# Patient Record
Sex: Male | Born: 1955 | Race: White | Hispanic: No | Marital: Married | Smoking: Former smoker
Health system: Southern US, Community
[De-identification: ages and names within clinical notes are randomized; demographics above are authoritative.]

## PROBLEM LIST (undated history)

## (undated) DIAGNOSIS — J449 Chronic obstructive pulmonary disease, unspecified: Secondary | ICD-10-CM

## (undated) DIAGNOSIS — T7840XA Allergy, unspecified, initial encounter: Secondary | ICD-10-CM

## (undated) DIAGNOSIS — N400 Enlarged prostate without lower urinary tract symptoms: Secondary | ICD-10-CM

## (undated) HISTORY — DX: Chronic obstructive pulmonary disease, unspecified: J44.9

## (undated) HISTORY — DX: Allergy, unspecified, initial encounter: T78.40XA

## (undated) HISTORY — DX: Benign prostatic hyperplasia without lower urinary tract symptoms: N40.0

---

## 1980-07-28 HISTORY — PX: HERNIA REPAIR: SHX51

## 1992-07-28 HISTORY — PX: HERNIA REPAIR: SHX51

## 2006-12-07 ENCOUNTER — Emergency Department: Payer: Self-pay | Admitting: Unknown Physician Specialty

## 2009-07-24 ENCOUNTER — Ambulatory Visit: Payer: Self-pay | Admitting: Family Medicine

## 2010-04-24 DIAGNOSIS — J302 Other seasonal allergic rhinitis: Secondary | ICD-10-CM | POA: Insufficient documentation

## 2014-10-11 LAB — PSA: PSA: ABNORMAL

## 2014-10-11 LAB — LIPID PANEL
Cholesterol: 188 mg/dL (ref 0–200)
HDL: 106 mg/dL — AB (ref 35–70)
LDL Cholesterol: 71 mg/dL
Triglycerides: 55 mg/dL (ref 40–160)

## 2014-10-18 ENCOUNTER — Encounter: Payer: Self-pay | Admitting: General Surgery

## 2014-11-01 ENCOUNTER — Ambulatory Visit (INDEPENDENT_AMBULATORY_CARE_PROVIDER_SITE_OTHER): Payer: BLUE CROSS/BLUE SHIELD | Admitting: General Surgery

## 2014-11-01 ENCOUNTER — Encounter: Payer: Self-pay | Admitting: General Surgery

## 2014-11-01 DIAGNOSIS — L723 Sebaceous cyst: Secondary | ICD-10-CM | POA: Diagnosis not present

## 2014-11-01 NOTE — Patient Instructions (Signed)
The patient is aware to call back for any questions or concerns.  

## 2014-11-01 NOTE — Progress Notes (Signed)
Patient ID: Cody [name], male   DOB: 21-Dec-1955, 59 y.o.   MRN: [id number]  Chief Complaint  Patient presents with  . Cyst    left upper back    HPI Cody [name] is a 59 y.o. male.  Here for evaluation of a cyst upper back. He states it has been there for many years and it has been drained a couple of times in the past. He states it started draining this time about one month ago.   HPI     Past Medical History  Diagnosis Date  . Prostate enlargement   . COPD (chronic obstructive pulmonary disease)     Past Surgical History  Procedure Laterality Date  . Hernia repair Bilateral 1982  . Hernia repair Right 1994    History reviewed. No pertinent family history.  Social History History  Substance Use Topics  . Smoking status: Former Smoker -- 30 years    Quit date: 07/29/2011  . Smokeless tobacco: Never Used  . Alcohol Use: 0.0 oz/week    0 Standard drinks or equivalent per week     Comment: 1-2/day    Not on File  Current Outpatient Prescriptions  Medication Sig Dispense Refill  . fluticasone (FLONASE) 50 MCG/ACT nasal spray   0  . loratadine (CLARITIN) 10 MG tablet Take 10 mg by mouth daily.     No current facility-administered medications for this visit.    Review of Systems Review of Systems  Constitutional: Negative.   Respiratory: Negative.   Cardiovascular: Negative.     Blood pressure 130/74, pulse 78, resp. rate 12, height 5\' 9"  (1.753 m), weight 139 lb (63.05 kg).  Physical Exam Physical Exam  Constitutional: He is oriented to person, place, and time. He appears well-developed and well-nourished.  Eyes: Conjunctivae are normal. No scleral icterus.  Neck: Neck supple.  Cardiovascular: Normal rate, regular rhythm and normal heart sounds.   Pulmonary/Chest: Effort normal and breath sounds normal.  Lymphadenopathy:    He has no cervical adenopathy.  Neurological: He is alert and oriented to person, place, and time.  Skin: Skin is warm and dry.   3 cm  cutaneous cyst posterior base of neck with small amount of cyst content draining through a small opening.    Data Reviewed none  Assessment    Sebaceous cyst.    Plan    Excision of Sebaceous cyst. Pt advised on this and he is agreeable      PCP/Ref:  Teddy Spike 11/01/2014, 11:24 AM

## 2014-11-13 ENCOUNTER — Encounter: Payer: Self-pay | Admitting: General Surgery

## 2014-11-13 ENCOUNTER — Telehealth: Payer: Self-pay | Admitting: *Deleted

## 2014-11-13 ENCOUNTER — Ambulatory Visit (INDEPENDENT_AMBULATORY_CARE_PROVIDER_SITE_OTHER): Payer: BLUE CROSS/BLUE SHIELD | Admitting: General Surgery

## 2014-11-13 VITALS — BP 120/66 | HR 76 | Resp 12 | Ht 69.0 in | Wt 136.0 lb

## 2014-11-13 DIAGNOSIS — L723 Sebaceous cyst: Secondary | ICD-10-CM | POA: Diagnosis not present

## 2014-11-13 NOTE — Telephone Encounter (Signed)
Patient was seen here in the office today for an EX and he said that the bandage is shocked with blood and that the doctor said he should just see a few spots of blood. He was just wondering what he needs to do.

## 2014-11-13 NOTE — Telephone Encounter (Signed)
Patient states that his bandage was soaked thru and the area is still bleeding when he removed and replaced his bandage. He has been applying ice and pressure to the area. Patient advised to return to the office.

## 2014-11-13 NOTE — Progress Notes (Signed)
Patient ID: Cody [name], male   DOB: 09/19/1955, 59 y.o.   MRN: [id number] Patient here for   excision of a sebaceous cyst from the back of his neck. Completed today with no immediate complications.  Procedure: excision of sebaceous cyst on back of neck  Anesthetic: 71ml 1% xylocaine with 0.5% marcaine  Prep: chloro prep  Procedure: area was prepped and draped with patient in prone position. Transverse elliptical incision was made about 3cm in length.  The cyst alone with the overlying skin was excised out in full from the deep subcutaneous plane.  Bleeders with sutured ligated with 3.0 vicryl. Skin was approximated with interrupt 3.0 nylon. Dressing was neosporin telfa and tegaderm skin. No immediate problems encountered.  10 day follow up with suture removal. Patient advised on wound care and to use ibuprofen or tylenol as needed for pain.        PCP/Ref: Bobetta Lime

## 2014-11-13 NOTE — Patient Instructions (Addendum)
Keep area clean and dry. You may shower. You can remove the waterproof dressing in two days. Return in 10 days for suture removal. We will call with pathology report.

## 2014-11-23 ENCOUNTER — Ambulatory Visit (INDEPENDENT_AMBULATORY_CARE_PROVIDER_SITE_OTHER): Payer: BLUE CROSS/BLUE SHIELD | Admitting: *Deleted

## 2014-11-23 DIAGNOSIS — L723 Sebaceous cyst: Secondary | ICD-10-CM

## 2014-11-23 NOTE — Progress Notes (Signed)
Patient came in today for a wound check/suture removal.  The wound is clean, with no signs of infection noted. Suture removed steri strip applied. Aware of pathology. Follow up as needed.

## 2014-11-23 NOTE — Patient Instructions (Signed)
The patient is aware to call back for any questions or concerns.  

## 2015-07-16 ENCOUNTER — Encounter: Payer: Self-pay | Admitting: Family Medicine

## 2015-07-16 ENCOUNTER — Other Ambulatory Visit: Payer: Self-pay

## 2015-07-16 ENCOUNTER — Ambulatory Visit (INDEPENDENT_AMBULATORY_CARE_PROVIDER_SITE_OTHER): Payer: BLUE CROSS/BLUE SHIELD | Admitting: Family Medicine

## 2015-07-16 VITALS — BP 120/60 | HR 109 | Temp 97.8°F | Resp 16 | Ht 69.0 in | Wt 142.8 lb

## 2015-07-16 DIAGNOSIS — J441 Chronic obstructive pulmonary disease with (acute) exacerbation: Secondary | ICD-10-CM

## 2015-07-16 DIAGNOSIS — IMO0001 Reserved for inherently not codable concepts without codable children: Secondary | ICD-10-CM | POA: Insufficient documentation

## 2015-07-16 DIAGNOSIS — J309 Allergic rhinitis, unspecified: Secondary | ICD-10-CM

## 2015-07-16 DIAGNOSIS — J3089 Other allergic rhinitis: Principal | ICD-10-CM

## 2015-07-16 DIAGNOSIS — R972 Elevated prostate specific antigen [PSA]: Secondary | ICD-10-CM

## 2015-07-16 DIAGNOSIS — N400 Enlarged prostate without lower urinary tract symptoms: Secondary | ICD-10-CM | POA: Insufficient documentation

## 2015-07-16 DIAGNOSIS — J302 Other seasonal allergic rhinitis: Secondary | ICD-10-CM

## 2015-07-16 MED ORDER — AMOXICILLIN-POT CLAVULANATE 875-125 MG PO TABS: 1.0000 | ORAL_TABLET | Freq: Two times a day (BID) | ORAL | Status: DC

## 2015-07-16 MED ORDER — BENZONATATE 100 MG PO CAPS: 100.0000 mg | ORAL_CAPSULE | Freq: Three times a day (TID) | ORAL | Status: DC

## 2015-07-16 NOTE — Telephone Encounter (Signed)
Patient called and stated he was in earlier today, but needed refills on his allergy medication and nasal spray.

## 2015-07-16 NOTE — Progress Notes (Signed)
Name: Cody [name]   MRN: [id number]    DOB: 20-Jul-1956   Date:07/16/2015       Progress Note  Subjective  Chief Complaint  Chief Complaint  Patient presents with  . URI    onset 1 week sysptoms include: cough, SOB, weakness and nasal congestion    HPI  COPD exacerbation: he developed cold symptoms with nasal congestion, and mild rhinorrhea, followed by a dry cough that is worse at night, mild SOB and feeling tired. Used to smoke cigarettes for about 30 years but quit 3 years ago. Discussed low dose CT scan and he will think about it. He is able to go to work, no fever. Denies chest pain.    Patient Active Problem List   Diagnosis Date Noted  . BPH with elevated PSA 07/16/2015  . COPD bronchitis 07/16/2015  . Seasonal and perennial allergic rhinitis 04/24/2010    Past Surgical History  Procedure Laterality Date  . Hernia repair Bilateral 1982  . Hernia repair Right 1994    Family History  Problem Relation Age of Onset  . Heart disease Father   . CVA Mother     Social History   Social History  . Marital Status: Married    Spouse Name: N/A  . Number of Children: N/A  . Years of Education: N/A   Occupational History  . Not on file.   Social History Main Topics  . Smoking status: Former Smoker -- 30 years    Quit date: 07/29/2011  . Smokeless tobacco: Never Used  . Alcohol Use: 0.0 oz/week    0 Standard drinks or equivalent per week     Comment: 1-2/day  . Drug Use: No  . Sexual Activity: Not on file   Other Topics Concern  . Not on file   Social History Narrative     Current outpatient prescriptions:  .  amoxicillin-clavulanate (AUGMENTIN) 875-125 MG tablet, Take 1 tablet by mouth 2 (two) times daily., Disp: 20 tablet, Rfl: 0 .  benzonatate (TESSALON) 100 MG capsule, Take 1-2 capsules (100-200 mg total) by mouth 3 (three) times daily., Disp: 40 capsule, Rfl: 0 .  fluticasone (FLONASE) 50 MCG/ACT nasal spray, , Disp: , Rfl: 0 .  loratadine (CLARITIN)  10 MG tablet, Take 10 mg by mouth daily., Disp: , Rfl:  .  tamsulosin (FLOMAX) 0.4 MG CAPS capsule, Take 1 capsule by mouth daily., Disp: , Rfl: 10  No Known Allergies   ROS  Constitutional: Negative for fever or weight change.  Respiratory: Positive  for cough and shortness of breath.   Cardiovascular: Negative for chest pain or palpitations.  Gastrointestinal: Negative for abdominal pain, no bowel changes.  Musculoskeletal: Negative for gait problem or joint swelling.  Skin: Negative for rash.  Neurological: Occasionally orthostatic changes negative for headache.  No other specific complaints in a complete review of systems (except as listed in HPI above).  Objective  Filed Vitals:   07/16/15 1019  BP: 120/60  Pulse: 109  Temp: 97.8 F (36.6 C)  TempSrc: Oral  Resp: 16  Height: '5\' 9"'$  (1.753 m)  Weight: 142 lb 12.8 oz (64.774 kg)  SpO2: 96%    Body mass index is 21.08 kg/(m^2).  Physical Exam  Constitutional: Patient appears well-developed and well-nourished. Thin. No distress.  HEENT: head atraumatic, normocephalic, pupils equal and reactive to light, ears TM, no tenderness during palpation of sinus,  neck supple, throat within normal limits Cardiovascular: Normal rate, regular rhythm ( 100 during exam )  and normal heart sounds.  No murmur heard. No BLE edema. Pulmonary/Chest: Effort normal and breath sounds normal. No respiratory distress. Abdominal: Soft.  There is no tenderness. Psychiatric: Patient has a normal mood and affect. behavior is normal. Judgment and thought content normal.  PHQ2/9: Depression screen PHQ 2/9 07/16/2015  Decreased Interest 0  Down, Depressed, Hopeless 0  PHQ - 2 Score 0     Fall Risk: Fall Risk  07/16/2015  Falls in the past year? No    Functional Status Survey: Is the patient deaf or have difficulty hearing?: No Does the patient have difficulty seeing, even when wearing glasses/contacts?: Yes (glasses) Does the patient have  difficulty concentrating, remembering, or making decisions?: No Does the patient have difficulty walking or climbing stairs?: No Does the patient have difficulty dressing or bathing?: No Does the patient have difficulty doing errands alone such as visiting a doctor's office or shopping?: No   Assessment & Plan  1. COPD with acute exacerbation ([place])  - DG Chest 2 View; Future - benzonatate (TESSALON) 100 MG capsule; Take 1-2 capsules (100-200 mg total) by mouth 3 (three) times daily.  Dispense: 40 capsule; Refill: 0 - amoxicillin-clavulanate (AUGMENTIN) 875-125 MG tablet; Take 1 tablet by mouth 2 (two) times daily.  Dispense: 20 tablet; Refill: 0 Call back if no improvement  2. Seasonal and perennial allergic rhinitis  Advised to resume allergy medication

## 2015-07-17 MED ORDER — LORATADINE 10 MG PO TABS: 10.0000 mg | ORAL_TABLET | Freq: Every day | ORAL | Status: DC

## 2015-07-17 MED ORDER — FLUTICASONE PROPIONATE 50 MCG/ACT NA SUSP: 2.0000 | Freq: Every day | NASAL | Status: DC

## 2015-10-10 ENCOUNTER — Ambulatory Visit (INDEPENDENT_AMBULATORY_CARE_PROVIDER_SITE_OTHER): Payer: BLUE CROSS/BLUE SHIELD | Admitting: Family Medicine

## 2015-10-10 ENCOUNTER — Encounter: Payer: Self-pay | Admitting: Family Medicine

## 2015-10-10 VITALS — BP 122/78 | HR 101 | Temp 98.1°F | Resp 14 | Ht 69.0 in | Wt 144.0 lb

## 2015-10-10 DIAGNOSIS — Z113 Encounter for screening for infections with a predominantly sexual mode of transmission: Secondary | ICD-10-CM

## 2015-10-10 DIAGNOSIS — Z136 Encounter for screening for cardiovascular disorders: Secondary | ICD-10-CM

## 2015-10-10 DIAGNOSIS — J309 Allergic rhinitis, unspecified: Secondary | ICD-10-CM

## 2015-10-10 DIAGNOSIS — Z13220 Encounter for screening for lipoid disorders: Secondary | ICD-10-CM | POA: Diagnosis not present

## 2015-10-10 DIAGNOSIS — R208 Other disturbances of skin sensation: Secondary | ICD-10-CM

## 2015-10-10 DIAGNOSIS — Z0000 Encounter for general adult medical examination without abnormal findings: Secondary | ICD-10-CM

## 2015-10-10 DIAGNOSIS — Z1211 Encounter for screening for malignant neoplasm of colon: Secondary | ICD-10-CM | POA: Diagnosis not present

## 2015-10-10 DIAGNOSIS — IMO0001 Reserved for inherently not codable concepts without codable children: Secondary | ICD-10-CM

## 2015-10-10 DIAGNOSIS — R200 Anesthesia of skin: Secondary | ICD-10-CM

## 2015-10-10 DIAGNOSIS — J302 Other seasonal allergic rhinitis: Secondary | ICD-10-CM

## 2015-10-10 DIAGNOSIS — J3089 Other allergic rhinitis: Secondary | ICD-10-CM

## 2015-10-10 MED ORDER — FLUTICASONE PROPIONATE 50 MCG/ACT NA SUSP: 2.0000 | Freq: Every day | NASAL | Status: DC

## 2015-10-10 NOTE — Progress Notes (Signed)
Name: Cody [name]   MRN: [id number]    DOB: 1955-11-01   Date:10/10/2015       Progress Note  Subjective  Chief Complaint  Chief Complaint  Patient presents with  . Annual Exam    HPI  Patient is here today for a Complete Male Physical Exam:  The patient has no acute concerns although his allergies do get worse this time of year when Spring is coming on.  Using claritin and flonase daily. Overall feels healthy. Diet is well balanced. In general does exercise regularly. Sees dentist regularly and addresses vision concerns with ophthalmologist if applicable. In regards to sexual activity the patient is not currently sexually active. Currently is not concerned about exposure to any STDs.   Has not had colon cancer screening yet. Would like cologuard testing.   Otherwise following with Urology for PSA, normal biopsy findings, symptoms controled.   Does have occasional numbness in bilateral 1st digits of both feet along the plantar surface. Not painful.   Past Medical History  Diagnosis Date  . Prostate enlargement   . COPD (chronic obstructive pulmonary disease) ([place])   . Allergy     Past Surgical History  Procedure Laterality Date  . Hernia repair Bilateral 1982  . Hernia repair Right 1994    Family History  Problem Relation Age of Onset  . Heart disease Father   . CVA Mother     Social History   Social History  . Marital Status: Married    Spouse Name: N/A  . Number of Children: N/A  . Years of Education: N/A   Occupational History  . Not on file.   Social History Main Topics  . Smoking status: Former Smoker -- 30 years    Quit date: 07/29/2011  . Smokeless tobacco: Never Used  . Alcohol Use: 0.0 oz/week    0 Standard drinks or equivalent per week     Comment: 1-2/day  . Drug Use: No  . Sexual Activity: Not on file   Other Topics Concern  . Not on file   Social History Narrative     Current outpatient prescriptions:  .  fluticasone (FLONASE) 50  MCG/ACT nasal spray, Place 2 sprays into both nostrils daily., Disp: 16 g, Rfl: 5 .  loratadine (CLARITIN) 10 MG tablet, Take 1 tablet (10 mg total) by mouth daily., Disp: 30 tablet, Rfl: 5 .  tamsulosin (FLOMAX) 0.4 MG CAPS capsule, Take 1 capsule by mouth daily., Disp: , Rfl: 10  No Known Allergies  ROS  CONSTITUTIONAL: No significant weight changes, fever, chills, weakness or fatigue.  HEENT:  - Eyes: No visual changes.  - Ears: No auditory changes. No pain.  - Nose: Yes sneezing, congestion, runny nose. - Throat: No sore throat. No changes in swallowing. SKIN: No rash or itching.  CARDIOVASCULAR: No chest pain, chest pressure or chest discomfort. No palpitations or edema.  RESPIRATORY: No shortness of breath, cough or sputum.  GASTROINTESTINAL: No anorexia, nausea, vomiting. No changes in bowel habits. No abdominal pain or blood.  GENITOURINARY: No dysuria. No frequency. No discharge.  NEUROLOGICAL: No headache, dizziness, syncope, paralysis, ataxia, numbness or tingling in the extremities. No memory changes. No change in bowel or bladder control.  MUSCULOSKELETAL: No joint pain. No muscle pain. HEMATOLOGIC: No anemia, bleeding or bruising.  LYMPHATICS: No enlarged lymph nodes.  PSYCHIATRIC: No change in mood. No change in sleep pattern.  ENDOCRINOLOGIC: No reports of sweating, cold or heat intolerance. No polyuria or polydipsia.  Objective  Filed Vitals:   10/10/15 1126  BP: 122/78  Pulse: 101  Temp: 98.1 F (36.7 C)  TempSrc: Oral  Resp: 14  Height: '5\' 9"'$  (1.753 m)  Weight: 144 lb (65.318 kg)  SpO2: 97%   Body mass index is 21.26 kg/(m^2).  Depression screen [facility] 2/9 10/10/2015 07/16/2015  Decreased Interest 0 0  Down, Depressed, Hopeless 0 0  PHQ - 2 Score 0 0     Physical Exam  Constitutional: Patient appears well-developed and well-nourished. In no distress.  HEENT:  - [name]: Normocephalic and atraumatic.  - Ears: Bilateral TMs gray, no erythema or  effusion - Nose: Nasal mucosa moist, boggy - Mouth/Throat: Oropharynx is clear and moist. No tonsillar hypertrophy or erythema. No post nasal drainage.  - Eyes: Conjunctivae clear, EOM movements normal. PERRLA. No scleral icterus.  Neck: Normal range of motion. Neck supple. No JVD present. No thyromegaly present.  Cardiovascular: Normal rate, regular rhythm and normal heart sounds.  No murmur heard.  Pulmonary/Chest: Effort normal and breath sounds normal. No respiratory distress. Abdominal: Soft. Bowel sounds are normal, no distension. There is no tenderness. no masses BREAST: Bilateral breast exam normal with no masses, skin changes or nipple discharge MALE GENITALIA: Bilateral testes descended with no masses, no penile lesions, no penile discharge. PROSTATE: Normal prostate size and consistency. RECTAL: Deferred Musculoskeletal: Normal range of motion bilateral UE and LE, no joint effusions. Peripheral vascular: Bilateral LE no edema. Neurological: CN II-XII grossly intact with no focal deficits. Alert and oriented to person, place, and time. Coordination, balance, strength, speech and gait are normal.  Skin: Skin is warm and dry. No rash noted. No erythema.  Psychiatric: Patient has a normal mood and affect. Behavior is normal in office today. Judgment and thought content normal in office today.    Assessment & Plan  1. Annual physical exam Discussed in detail all recommended preventative measures appropriate for age and gender now and in the future.  Patient has been counseled that due to this being my last week at this practice I do not recommend any new orders be placed. If patient has insisted on any such orders they have acknowledged it is their sole responsibility alone to follow up on their results and to follow up with another provider in person to discuss results, interpretation and further care. They voice agreement to what has been discussed today.  - CBC with  Differential/Platelet - Comprehensive metabolic panel  2. Encounter for cholesteral screening for cardiovascular disease  - Lipid panel  3. Encounter for screening for malignant neoplasm of colon  - Cologuard  4. Seasonal and perennial allergic rhinitis Try toggling over to Allegra instead of Loratadine, continue flonase.   - CBC with Differential/Platelet - Comprehensive metabolic panel - fluticasone (FLONASE) 50 MCG/ACT nasal spray; Place 2 sprays into both nostrils daily.  Dispense: 16 g; Refill: 5  5. Numbness of toes Monitor symptoms, no significant clinical findings.  - CBC with Differential/Platelet - Comprehensive metabolic panel - TSH  6. Screening for STD (sexually transmitted disease)  - HIV antibody - Hepatitis C antibody

## 2015-10-10 NOTE — Patient Instructions (Signed)
1) Debrox for ear wax 2) Switch to Allegra generic

## 2015-10-15 LAB — HEPATITIS C ANTIBODY

## 2015-10-16 LAB — CBC WITH DIFFERENTIAL/PLATELET
Basophils Absolute: 0.1 10*3/uL (ref 0.0–0.2)
Basos: 1 %
EOS (ABSOLUTE): 0.2 10*3/uL (ref 0.0–0.4)
Eos: 4 %
Hematocrit: 46.9 % (ref 37.5–51.0)
Hemoglobin: 16.4 g/dL (ref 12.6–17.7)
Immature Grans (Abs): 0 10*3/uL (ref 0.0–0.1)
Immature Granulocytes: 0 %
Lymphocytes Absolute: 1.6 10*3/uL (ref 0.7–3.1)
Lymphs: 32 %
MCH: 31.5 pg (ref 26.6–33.0)
MCHC: 35 g/dL (ref 31.5–35.7)
MCV: 90 fL (ref 79–97)
Monocytes Absolute: 0.7 10*3/uL (ref 0.1–0.9)
Monocytes: 14 %
Neutrophils Absolute: 2.4 10*3/uL (ref 1.4–7.0)
Neutrophils: 49 %
Platelets: 212 10*3/uL (ref 150–379)
RBC: 5.2 x10E6/uL (ref 4.14–5.80)
RDW: 13.4 % (ref 12.3–15.4)
WBC: 5 10*3/uL (ref 3.4–10.8)

## 2015-10-16 LAB — COMPREHENSIVE METABOLIC PANEL
ALT: 18 IU/L (ref 0–44)
AST: 20 IU/L (ref 0–40)
Albumin/Globulin Ratio: 1.6 (ref 1.2–2.2)
Albumin: 4.6 g/dL (ref 3.6–4.8)
Alkaline Phosphatase: 62 IU/L (ref 39–117)
BUN/Creatinine Ratio: 15 (ref 10–22)
BUN: 15 mg/dL (ref 8–27)
Bilirubin Total: 0.3 mg/dL (ref 0.0–1.2)
CO2: 22 mmol/L (ref 18–29)
Calcium: 9.4 mg/dL (ref 8.6–10.2)
Chloride: 98 mmol/L (ref 96–106)
Creatinine, Ser: 0.99 mg/dL (ref 0.76–1.27)
GFR calc Af Amer: 95 mL/min/{1.73_m2} (ref >59)
GFR calc non Af Amer: 82 mL/min/{1.73_m2} (ref >59)
Globulin, Total: 2.8 g/dL (ref 1.5–4.5)
Glucose: 103 mg/dL — ABNORMAL HIGH (ref 65–99)
Potassium: 4.3 mmol/L (ref 3.5–5.2)
Sodium: 139 mmol/L (ref 134–144)
Total Protein: 7.4 g/dL (ref 6.0–8.5)

## 2015-10-16 LAB — LIPID PANEL
Chol/HDL Ratio: 2 ratio units (ref 0.0–5.0)
Cholesterol, Total: 208 mg/dL — ABNORMAL HIGH (ref 100–199)
HDL: 103 mg/dL (ref >39)
LDL Calculated: 91 mg/dL (ref 0–99)
Triglycerides: 68 mg/dL (ref 0–149)
VLDL Cholesterol Cal: 14 mg/dL (ref 5–40)

## 2015-10-16 LAB — TSH: TSH: 3.16 u[IU]/mL (ref 0.450–4.500)

## 2015-10-16 LAB — HIV ANTIBODY (ROUTINE TESTING W REFLEX): HIV Screen 4th Generation wRfx: NONREACTIVE

## 2015-10-16 LAB — HCV ANTIBODY: Hep C Virus Ab: 0.2 s/co ratio (ref 0.0–0.9)

## 2015-12-28 ENCOUNTER — Encounter: Payer: Self-pay | Admitting: Urology

## 2015-12-28 ENCOUNTER — Ambulatory Visit (INDEPENDENT_AMBULATORY_CARE_PROVIDER_SITE_OTHER): Payer: BLUE CROSS/BLUE SHIELD | Admitting: Urology

## 2015-12-28 VITALS — BP 157/95 | HR 94 | Ht 70.0 in | Wt 141.4 lb

## 2015-12-28 DIAGNOSIS — N400 Enlarged prostate without lower urinary tract symptoms: Secondary | ICD-10-CM | POA: Diagnosis not present

## 2015-12-28 DIAGNOSIS — R972 Elevated prostate specific antigen [PSA]: Secondary | ICD-10-CM | POA: Diagnosis not present

## 2015-12-28 MED ORDER — TAMSULOSIN HCL 0.4 MG PO CAPS: 0.4000 mg | ORAL_CAPSULE | Freq: Every day | ORAL | Status: DC

## 2015-12-28 NOTE — Progress Notes (Signed)
12/28/2015 9:04 AM   Cody [name] [birth year] [id number]  Referring provider: Bobetta Lime, MD [address] [place], [place] [address]  Chief Complaint  Patient presents with  . Follow-up  . Medication Refill    HPI: 60 year old male who was seen just over a year ago in our office for elevated PSA/ BPH.  Elevated PSA Patient was found to have an elevated PSA of 5.3. Recheck on 11/24/2014 showed a PSA of 5.1. Patient was offered prostate biopsy.  He underwent uncomplicated prostate biopsy on 12/14/14 which was negative for malignancy.  TRUS vol 68 g.    He has not had a PSA or rectal exam since his biopsy just over a year ago.  BPH with obstructive voiding symptoms At the time of evaluation last year, he was noted to have urinary intermittency and weak stream as well as sensation of incomplete bladder emptying. He was placed on Flomax.  Since being on Flomax, he is noted an improvement in his stream and his overall urinary symptoms. He is relatively pleased today. He would like to continue this medication but ran out 2 weeks ago. Since running out of medication, he is noticed a slow return of his hesitancy and sensation of incomplete bladder emptying.   PMH: Past Medical History  Diagnosis Date  . Prostate enlargement   . COPD (chronic obstructive pulmonary disease) ([place])   . Allergy     Surgical History: Past Surgical History  Procedure Laterality Date  . Hernia repair Bilateral 1982  . Hernia repair Right 1994    Home Medications:    Medication List       This list is accurate as of: 12/28/15  9:04 AM.  Always use your most recent med list.               tamsulosin 0.4 MG Caps capsule  Commonly known as:  FLOMAX  Take 1 capsule (0.4 mg total) by mouth daily.        Allergies: No Known Allergies  Family History: Family History  Problem Relation Age of Onset  . Heart disease Father   . CVA Mother     Social History:  reports that he  quit smoking about 4 years ago. He has never used smokeless tobacco. He reports that he drinks about 3.0 oz of alcohol per week. He reports that he does not use illicit drugs.  ROS: UROLOGY Frequent Urination?: No Hard to postpone urination?: No Burning/pain with urination?: No Get up at night to urinate?: No Leakage of urine?: No Urine stream starts and stops?: No Trouble starting stream?: No Do you have to strain to urinate?: No Blood in urine?: No Urinary tract infection?: No Sexually transmitted disease?: No Injury to kidneys or bladder?: No Painful intercourse?: No Weak stream?: Yes Erection problems?: No Penile pain?: No  Gastrointestinal Nausea?: No Vomiting?: No Indigestion/heartburn?: No Diarrhea?: No Constipation?: No  Constitutional Fever: No Night sweats?: No Weight loss?: No Fatigue?: No  Skin Skin rash/lesions?: No Itching?: No  Eyes Blurred vision?: No Double vision?: No  Ears/Nose/Throat Sore throat?: No Sinus problems?: No  Hematologic/Lymphatic Swollen glands?: No Easy bruising?: No  Cardiovascular Leg swelling?: No Chest pain?: No  Respiratory Cough?: No Shortness of breath?: No  Endocrine Excessive thirst?: No  Musculoskeletal Back pain?: No Joint pain?: No  Neurological Headaches?: No Dizziness?: No  Psychologic Depression?: No Anxiety?: No  Physical Exam: BP 157/95 mmHg  Pulse 94  Ht '5\' 10"'$  (1.778 m)  Wt 141  lb 6.4 oz (64.139 kg)  BMI 20.29 kg/m2  Constitutional:  Alert and oriented, No acute distress. HEENT: [place] AT, moist mucus membranes.  Trachea midline, no masses. Cardiovascular: No clubbing, cyanosis, or edema. Respiratory: Normal respiratory effort, no increased work of breathing. GI: Abdomen is soft, nontender, nondistended, no abdominal masses GU: No CVA tenderness.  Rectal: Normal sphincter tone, 50+ cc, nontender, no nodules.   Skin: No rashes, bruises or suspicious lesions. Neurologic: Grossly  intact, no focal deficits, moving all 4 extremities. Psychiatric: Normal mood and affect.  Laboratory Data: Lab Results  Component Value Date   WBC 5.0 10/15/2015   HCT 46.9 10/15/2015   MCV 90 10/15/2015   PLT 212 10/15/2015    Lab Results  Component Value Date   CREATININE 0.99 10/15/2015    Lab Results  Component Value Date   PSA Abnormal 5.3 10/11/2014    Pertinent Imaging: N/a   Assessment & Plan:    1. Elevated PSA History of elevated PSA status post negative biopsy just over a year ago Exam today is unremarkable although enlarged PSA drawn today, will call with results Recommend continued annual PSA/rectal exam for prostate cancer screening - PSA  2. BPH (benign prostatic hyperplasia) Continue flomax Obstructive urinary symptoms well controlled   Return in about 1 year (around 12/27/2016) for IPSS/ PSA/ DRE.  Hollice Espy, MD  [facility] [address] [place], [place] [address] [phone]

## 2015-12-29 LAB — PSA: Prostate Specific Ag, Serum: 5.8 ng/mL — ABNORMAL HIGH (ref 0.0–4.0)

## 2015-12-31 ENCOUNTER — Telehealth: Payer: Self-pay

## 2015-12-31 DIAGNOSIS — R972 Elevated prostate specific antigen [PSA]: Secondary | ICD-10-CM

## 2015-12-31 NOTE — Telephone Encounter (Signed)
-----   Message from Hollice Espy, MD sent at 12/30/2015  1:46 PM EDT ----- Please let this patient know that his PSA is still quite elevated, 5.8 which is slightly up from 1 year ago at 5.3.  As a precaution, I would like him to get a PSA in 6 months (lab only) in order to be sure that it is no trending up slowly.  If stable at that time, I will plan to see him in 1 year as discussed in the office.    Please arrange.  Hollice Espy, MD

## 2015-12-31 NOTE — Telephone Encounter (Signed)
LMOM

## 2015-12-31 NOTE — Telephone Encounter (Signed)
Spoke with pt in reference to PSA results. Pt voiced understanding. Lab appt made. Orders placed.

## 2016-07-01 ENCOUNTER — Other Ambulatory Visit: Payer: BLUE CROSS/BLUE SHIELD

## 2016-07-01 DIAGNOSIS — R972 Elevated prostate specific antigen [PSA]: Secondary | ICD-10-CM

## 2016-07-02 LAB — PSA: Prostate Specific Ag, Serum: 6.6 ng/mL — ABNORMAL HIGH (ref 0.0–4.0)

## 2016-09-08 ENCOUNTER — Telehealth: Payer: Self-pay

## 2016-09-08 DIAGNOSIS — R972 Elevated prostate specific antigen [PSA]: Secondary | ICD-10-CM

## 2016-09-08 NOTE — Telephone Encounter (Signed)
-----   Message from Hollice Espy, MD sent at 09/08/2016  3:08 PM EST ----- Not sure if this patient was called about his PSA drawn 2 months ago.  It is up again now to 6.6.  I would like to move his follow up to next month (3 months from this last PSA) with repeat lab 1-2 days before that follow up.  We will discuss options moving forward base on his PSA next visit.    Hollice Espy, MD

## 2016-09-08 NOTE — Telephone Encounter (Signed)
Not a good connection with cell service.

## 2016-09-09 NOTE — Telephone Encounter (Signed)
Spoke with pt in reference to PSA results. Made aware Dr. Erlene Quan wants to see next month and labs prior. Pt voiced understanding. Pt was transferred to the front to make f/u appt. Orders placed.

## 2016-10-20 ENCOUNTER — Other Ambulatory Visit: Payer: BLUE CROSS/BLUE SHIELD

## 2016-10-22 ENCOUNTER — Ambulatory Visit: Payer: BLUE CROSS/BLUE SHIELD | Admitting: Urology

## 2016-12-26 ENCOUNTER — Ambulatory Visit: Payer: BLUE CROSS/BLUE SHIELD | Admitting: Urology

## 2017-03-16 ENCOUNTER — Ambulatory Visit (INDEPENDENT_AMBULATORY_CARE_PROVIDER_SITE_OTHER): Payer: 59 | Admitting: Family Medicine

## 2017-03-16 ENCOUNTER — Encounter: Payer: Self-pay | Admitting: Family Medicine

## 2017-03-16 VITALS — BP 138/78 | HR 92 | Temp 97.4°F | Resp 16 | Ht 70.0 in | Wt 139.2 lb

## 2017-03-16 DIAGNOSIS — J449 Chronic obstructive pulmonary disease, unspecified: Secondary | ICD-10-CM

## 2017-03-16 DIAGNOSIS — Z131 Encounter for screening for diabetes mellitus: Secondary | ICD-10-CM

## 2017-03-16 DIAGNOSIS — Z13220 Encounter for screening for lipoid disorders: Secondary | ICD-10-CM

## 2017-03-16 DIAGNOSIS — R0609 Other forms of dyspnea: Secondary | ICD-10-CM

## 2017-03-16 DIAGNOSIS — R0600 Dyspnea, unspecified: Secondary | ICD-10-CM

## 2017-03-16 LAB — CBC WITH DIFFERENTIAL/PLATELET
Basophils Absolute: 48 cells/uL (ref 0–200)
Basophils Relative: 1 %
Eosinophils Absolute: 96 cells/uL (ref 15–500)
Eosinophils Relative: 2 %
HCT: 45.9 % (ref 38.5–50.0)
Hemoglobin: 15.6 g/dL (ref 13.2–17.1)
Lymphocytes Relative: 30 %
Lymphs Abs: 1440 cells/uL (ref 850–3900)
MCH: 31.5 pg (ref 27.0–33.0)
MCHC: 34 g/dL (ref 32.0–36.0)
MCV: 92.5 fL (ref 80.0–100.0)
MPV: 9.8 fL (ref 7.5–12.5)
Monocytes Absolute: 432 cells/uL (ref 200–950)
Monocytes Relative: 9 %
Neutro Abs: 2784 cells/uL (ref 1500–7800)
Neutrophils Relative %: 58 %
Platelets: 241 10*3/uL (ref 140–400)
RBC: 4.96 MIL/uL (ref 4.20–5.80)
RDW: 14.5 % (ref 11.0–15.0)
WBC: 4.8 10*3/uL (ref 3.8–10.8)

## 2017-03-16 MED ORDER — FLUTICASONE-UMECLIDIN-VILANT 100-62.5-25 MCG/INH IN AEPB: 1.0000 | INHALATION_SPRAY | Freq: Every day | RESPIRATORY_TRACT | 2 refills | Status: DC

## 2017-03-16 NOTE — Progress Notes (Signed)
Name: Cody [name]   MRN: [id number]    DOB: Mar 20, 1956   Date:03/16/2017       Progress Note  Subjective  Chief Complaint  Chief Complaint  Patient presents with  . COPD    was diagnosed years ago but has not used an Chief Operating Officer stated that he did not notice a difference with the use  . Shortness of Breath    upon excertion or when its hot outside    HPI  Exertional dyspnea: he has a history of COPD but not on medication, previous smoker 2.5 pack daily for about 30 years, he quit smoking in 2013. He has a chronic dry cough and SOB with activity for the past couple of years, but getting progressively worse. He states able to walk and mow yard, but if he needs to carry any weight ( such as wheel barrel ) he cannot breath. Symptoms are worse when humid and hot, no orthopnea. He has wheezing with exercise.    Patient Active Problem List   Diagnosis Date Noted  . BPH with elevated PSA 07/16/2015  . COPD bronchitis 07/16/2015  . Seasonal and perennial allergic rhinitis 04/24/2010    Past Surgical History:  Procedure Laterality Date  . HERNIA REPAIR Bilateral 1982  . HERNIA REPAIR Right 1994    Family History  Problem Relation Age of Onset  . Heart disease Father   . CVA Mother     Social History   Social History  . Marital status: Married    Spouse name: N/A  . Number of children: N/A  . Years of education: N/A   Occupational History  . Not on file.   Social History Main Topics  . Smoking status: Former Smoker    Years: 30.00    Quit date: 07/29/2011  . Smokeless tobacco: Never Used     Comment: pt vapes  . Alcohol use 3.0 oz/week    5 Standard drinks or equivalent per week     Comment: 1-2/day  . Drug use: No  . Sexual activity: Not on file   Other Topics Concern  . Not on file   Social History Narrative  . No narrative on file     Current Outpatient Prescriptions:  .  Fluticasone-Umeclidin-Vilant (TRELEGY ELLIPTA) 100-62.5-25 MCG/INH AEPB, Inhale 1 puff  into the lungs daily., Disp: 60 each, Rfl: 2 .  tamsulosin (FLOMAX) 0.4 MG CAPS capsule, Take 1 capsule (0.4 mg total) by mouth daily. (Patient not taking: Reported on 03/16/2017), Disp: 90 capsule, Rfl: 3  No Known Allergies   ROS  Constitutional: Negative for fever or weight change.  Respiratory: Positive  for cough and shortness of breath.   Cardiovascular: Negative for chest pain or palpitations.  Gastrointestinal: Negative for abdominal pain, no bowel changes.  Musculoskeletal: Negative for gait problem or joint swelling.  Skin: Negative for rash.  Neurological: Negative for dizziness or headache.  No other specific complaints in a complete review of systems (except as listed in HPI above).  Objective  Vitals:   03/16/17 1100  BP: 138/78  Pulse: 92  Resp: 16  Temp: (!) 97.4 F (36.3 C)  SpO2: 97%  Weight: 139 lb 3 oz (63.1 kg)  Height: 5\' 10"  (1.778 m)    Body mass index is 19.97 kg/m.  Physical Exam  Constitutional: Patient appears well-developed and well-nourished. Thin  No distress.  HEENT: head atraumatic, normocephalic, pupils equal and reactive to light,  neck supple, throat within normal limits Cardiovascular: Normal rate, regular  rhythm and normal heart sounds.  No murmur heard. No BLE edema. Pulmonary/Chest: Effort normal and breath sounds normal. No respiratory distress. Abdominal: Soft.  There is no tenderness. Psychiatric: Patient has a normal mood and affect. behavior is normal. Judgment and thought content normal.  PHQ2/9: Depression screen [facility] 2/9 03/16/2017 10/10/2015 07/16/2015  Decreased Interest 0 0 0  Down, Depressed, Hopeless 0 0 0  PHQ - 2 Score 0 0 0     Fall Risk: Fall Risk  03/16/2017 10/10/2015 07/16/2015  Falls in the past year? No No No     Assessment & Plan  1. Exertional dyspnea  - DG Chest 2 View; Future - EKG 12-Lead - unremarkable  - CBC with Differential/Platelet - COMPLETE METABOLIC PANEL WITH GFR - High sensitivity  CRP - Spirometry: Pre & Post Eval, GOLD classification 2 FEV1/FVC 65% and FEV1 56%  2. Lipid screening  - Lipid panel  3. Screening for diabetes mellitus  - Hemoglobin A1c  4. Chronic obstructive pulmonary disease, unspecified COPD type ([place])  Discussed possible side effects of new medication, we will start him on Trelegy if no improvement of symptoms refer to cardiologist

## 2017-03-17 LAB — HEMOGLOBIN A1C
Hgb A1c MFr Bld: 5.7 % — ABNORMAL HIGH (ref <5.7)
Mean Plasma Glucose: 117 mg/dL

## 2017-03-17 LAB — COMPLETE METABOLIC PANEL WITH GFR
ALT: 16 U/L (ref 9–46)
AST: 20 U/L (ref 10–35)
Albumin: 4.2 g/dL (ref 3.6–5.1)
Alkaline Phosphatase: 41 U/L (ref 40–115)
BUN: 13 mg/dL (ref 7–25)
CO2: 22 mmol/L (ref 20–32)
Calcium: 9.1 mg/dL (ref 8.6–10.3)
Chloride: 105 mmol/L (ref 98–110)
Creat: 0.87 mg/dL (ref 0.70–1.25)
GFR, Est African American: >89 mL/min (ref >=60)
GFR, Est Non African American: >89 mL/min (ref >=60)
Glucose, Bld: 113 mg/dL — ABNORMAL HIGH (ref 65–99)
Potassium: 4.5 mmol/L (ref 3.5–5.3)
Sodium: 139 mmol/L (ref 135–146)
Total Bilirubin: 0.4 mg/dL (ref 0.2–1.2)
Total Protein: 6.5 g/dL (ref 6.1–8.1)

## 2017-03-17 LAB — LIPID PANEL
Cholesterol: 174 mg/dL (ref <200)
HDL: 93 mg/dL (ref >40)
LDL Cholesterol: 72 mg/dL (ref <100)
Total CHOL/HDL Ratio: 1.9 Ratio (ref <5.0)
Triglycerides: 45 mg/dL (ref <150)
VLDL: 9 mg/dL (ref <30)

## 2017-03-17 LAB — HIGH SENSITIVITY CRP: CRP, High Sensitivity: 0.2 mg/L

## 2017-03-25 ENCOUNTER — Ambulatory Visit
Admission: RE | Admit: 2017-03-25 | Discharge: 2017-03-25 | Disposition: A | Discharge status: Home / Self Care | Payer: 59 | Source: Ambulatory Visit | Attending: Family Medicine | Admitting: Family Medicine

## 2017-03-25 DIAGNOSIS — R0609 Other forms of dyspnea: Principal | ICD-10-CM

## 2017-03-25 DIAGNOSIS — J449 Chronic obstructive pulmonary disease, unspecified: Secondary | ICD-10-CM | POA: Insufficient documentation

## 2017-03-25 DIAGNOSIS — R0600 Dyspnea, unspecified: Secondary | ICD-10-CM

## 2017-03-25 DIAGNOSIS — R918 Other nonspecific abnormal finding of lung field: Secondary | ICD-10-CM | POA: Insufficient documentation

## 2017-03-26 ENCOUNTER — Telehealth: Payer: Self-pay

## 2017-03-26 DIAGNOSIS — R9389 Abnormal findings on diagnostic imaging of other specified body structures: Secondary | ICD-10-CM

## 2017-03-26 NOTE — Telephone Encounter (Signed)
-----   Message from Steele Sizer, MD sent at 03/26/2017 10:57 AM EDT ----- He has COPD and small change on top of lungs, possible scarring, progression of COPD, but we need to recheck CXR in 6 weeks

## 2017-03-26 NOTE — Telephone Encounter (Signed)
Patient called I reviewed x-ray, he also states since taking the new inhaler his throat hurts and his voice has become raspy is this normal side effect?

## 2017-03-26 NOTE — Telephone Encounter (Signed)
Left message for patient to call us back regarding [place] results.

## 2017-03-27 NOTE — Telephone Encounter (Signed)
Yes. There is a corticosteroid on his inhaler, he can try gargling after each use and see if it will help.

## 2017-03-27 NOTE — Telephone Encounter (Signed)
I will place order for CXR to be done in 4 weeks , please make a reminder so we can call patient at the time

## 2017-04-14 ENCOUNTER — Telehealth: Payer: Self-pay | Admitting: Family Medicine

## 2017-04-14 NOTE — Telephone Encounter (Signed)
Pt has a high deductable. Voucher faxed to number pt gave to office.

## 2017-04-14 NOTE — Telephone Encounter (Signed)
Pt would like to know if we have vouchers for Trelegy. He says without a voucher this medication would cost him $500. Please advise.

## 2017-05-05 NOTE — Telephone Encounter (Signed)
Left message for patient to have chest x-ray recheck and order is in computer.

## 2017-05-08 ENCOUNTER — Ambulatory Visit
Admission: RE | Admit: 2017-05-08 | Discharge: 2017-05-08 | Disposition: A | Discharge status: Home / Self Care | Payer: 59 | Source: Ambulatory Visit | Attending: Family Medicine | Admitting: Family Medicine

## 2017-05-08 DIAGNOSIS — R9389 Abnormal findings on diagnostic imaging of other specified body structures: Secondary | ICD-10-CM | POA: Insufficient documentation

## 2017-05-08 DIAGNOSIS — R918 Other nonspecific abnormal finding of lung field: Secondary | ICD-10-CM | POA: Insufficient documentation

## 2017-05-11 ENCOUNTER — Other Ambulatory Visit: Payer: Self-pay | Admitting: Family Medicine

## 2017-05-11 ENCOUNTER — Telehealth: Payer: Self-pay

## 2017-05-11 DIAGNOSIS — R0602 Shortness of breath: Secondary | ICD-10-CM

## 2017-05-11 NOTE — Telephone Encounter (Signed)
Please order lung ct. Patient has been notified of his chest x-ray and has agreed to have ct scan done of his lung.

## 2017-05-12 ENCOUNTER — Other Ambulatory Visit: Payer: Self-pay | Admitting: Family Medicine

## 2017-05-12 DIAGNOSIS — R0602 Shortness of breath: Secondary | ICD-10-CM

## 2017-05-26 ENCOUNTER — Other Ambulatory Visit: Payer: Self-pay

## 2017-05-26 ENCOUNTER — Ambulatory Visit: Admission: RE | Admit: 2017-05-26 | Payer: 59 | Source: Ambulatory Visit

## 2017-05-26 ENCOUNTER — Ambulatory Visit
Admission: RE | Admit: 2017-05-26 | Discharge: 2017-05-26 | Disposition: A | Discharge status: Home / Self Care | Payer: 59 | Source: Ambulatory Visit | Attending: Family Medicine | Admitting: Family Medicine

## 2017-05-26 DIAGNOSIS — R0602 Shortness of breath: Secondary | ICD-10-CM | POA: Diagnosis present

## 2017-05-26 DIAGNOSIS — J439 Emphysema, unspecified: Secondary | ICD-10-CM | POA: Insufficient documentation

## 2017-05-26 DIAGNOSIS — R918 Other nonspecific abnormal finding of lung field: Secondary | ICD-10-CM | POA: Diagnosis not present

## 2017-05-26 DIAGNOSIS — R9389 Abnormal findings on diagnostic imaging of other specified body structures: Secondary | ICD-10-CM

## 2017-05-26 DIAGNOSIS — I700 Atherosclerosis of aorta: Secondary | ICD-10-CM | POA: Insufficient documentation

## 2017-06-16 ENCOUNTER — Ambulatory Visit: Payer: 59 | Admitting: Family Medicine

## 2017-07-30 NOTE — Progress Notes (Signed)
[facility] Consultation      Assessment and Plan:  The patient is a 62 year old male with severe emphysema and lung nodule.  Dyspnea on exertion secondary to severe emphysema. -Patient has severe homogenous emphysema. -We will start Trilogy inhaler, encouraged continued physical activity. -We will check alpha-1 antitrypsin screening, PFT..  Lung nodules. - Appear intermediate risk, however given the patient's severe emphysema lung biopsy would be high risk. -Repeat CT chest in 3 months, follow-up at that time.   Date: 07/31/2017  MRN# [id number] Cody [name] 05-07-1956    Cody [name] is a 62 y.o. old male seen in consultation for chief complaint of:    Chief Complaint  Patient presents with  . Advice Only    abnormal CT: ref by Sowles: SOB w/activity: chest tightness at times with exertion    HPI:  He has been getting winded over the past 6-8 months and has been slowly progressive. He used to be able to mow with a self propelled but was having to walk slower. He also plays percussion instruments and notices that if he is carrying the bag he is getting winded.  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.  He smoked 2.5 ppd, quit 4 years ago, he was getting frequent episodes of bronchitis. He has been diagnosed with COPD several years ago. He is not on any inhalers currently, he has been on Trelegy, thinks it may have helped slightly, but it was too expensive.   He has no heart problems, he had a stress test more than 20 years ago which was negative.   Imaging personally reviewed, CT chest 05/26/17; severe apical emphysema with right upper lobe scarring.  In addition there is severe homogenous emphysema throughout both lungs.  There are 2 nodules in the right lung, largest of which is 9 mm.   PMHX:   Past Medical History:  Diagnosis Date  . Allergy   . COPD (chronic obstructive pulmonary disease) ([place])   . Prostate enlargement    Surgical Hx:  Past  Surgical History:  Procedure Laterality Date  . HERNIA REPAIR Bilateral 1982  . HERNIA REPAIR Right 1994   Family Hx:  Family History  Problem Relation Age of Onset  . Heart disease Father   . CVA Mother    Social Hx:   Social History   Tobacco Use  . Smoking status: Former Smoker    Years: 30.00    Last attempt to quit: 07/29/2011    Years since quitting: 6.0  . Smokeless tobacco: Never Used  . Tobacco comment: pt vapes  Substance Use Topics  . Alcohol use: Yes    Alcohol/week: 3.0 oz    Types: 5 Standard drinks or equivalent per week    Comment: 1-2/day  . Drug use: No   Medication:    Current Outpatient Medications:  .  tamsulosin (FLOMAX) 0.4 MG CAPS capsule, Take 1 capsule (0.4 mg total) by mouth daily., Disp: 90 capsule, Rfl: 3   Allergies:  Patient has no known allergies.  Review of Systems: Gen:  Denies  fever, sweats, chills HEENT: Denies blurred vision, double vision. bleeds, sore throat Cvc:  No dizziness, chest pain. Resp:   Denies cough or sputum production, shortness of breath Gi: Denies swallowing difficulty, stomach pain. Gu:  Denies bladder incontinence, burning urine Ext:   No Joint pain, stiffness. Skin: No skin rash,  hives  Endoc:  No polyuria, polydipsia. Psych: No depression, insomnia. Other:  All other systems  were reviewed with the patient and were negative other that what is mentioned in the HPI.   Physical Examination:   VS: BP 120/70 (BP Location: Left Arm, Cuff Size: Normal)   Pulse (!) 104   Ht 5\' 10"  (1.778 m)   Wt 134 lb (60.8 kg)   SpO2 95%   BMI 19.23 kg/m   General Appearance: No distress  Neuro:without focal findings,  speech normal,  HEENT: PERRLA, EOM intact.   Pulmonary: normal breath sounds, very reduced bilateral lung air entry. CardiovascularNormal S1,S2.  No m/r/g.   Abdomen: Benign, Soft, non-tender. Renal:  No costovertebral tenderness  GU:  No performed at this time. Endoc: No evident thyromegaly, no signs  of acromegaly. Skin:   warm, no rashes, no ecchymosis  Extremities: normal, no cyanosis, clubbing.  Other findings:    LABORATORY PANEL:   CBC No results for input(s): WBC, HGB, HCT, PLT in the last 168 hours. ------------------------------------------------------------------------------------------------------------------  Chemistries  No results for input(s): NA, K, CL, CO2, GLUCOSE, BUN, CREATININE, CALCIUM, MG, AST, ALT, ALKPHOS, BILITOT in the last 168 hours.  Invalid input(s): GFRCGP ------------------------------------------------------------------------------------------------------------------  Cardiac Enzymes No results for input(s): TROPONINI in the last 168 hours. ------------------------------------------------------------  RADIOLOGY:  No results found.     Thank  you for the consultation and for allowing [facility] to assist in the care of your patient. Our recommendations are noted above.  Please contact us if we can be of further service.   Marda Stalker, MD.  Board Certified in Internal Medicine, Pulmonary Medicine, [place], and Sleep Medicine.  [facility] Office Number: [phone]  Patricia Pesa, M.D.  Merton Border, M.D  07/31/2017

## 2017-07-31 ENCOUNTER — Encounter: Payer: Self-pay | Admitting: Internal Medicine

## 2017-07-31 ENCOUNTER — Ambulatory Visit (INDEPENDENT_AMBULATORY_CARE_PROVIDER_SITE_OTHER): Payer: 59 | Admitting: Internal Medicine

## 2017-07-31 VITALS — BP 120/70 | HR 104 | Ht 70.0 in | Wt 134.0 lb

## 2017-07-31 DIAGNOSIS — R911 Solitary pulmonary nodule: Secondary | ICD-10-CM | POA: Diagnosis not present

## 2017-07-31 DIAGNOSIS — J449 Chronic obstructive pulmonary disease, unspecified: Secondary | ICD-10-CM

## 2017-07-31 DIAGNOSIS — R0609 Other forms of dyspnea: Secondary | ICD-10-CM | POA: Diagnosis not present

## 2017-07-31 DIAGNOSIS — R0600 Dyspnea, unspecified: Secondary | ICD-10-CM

## 2017-07-31 DIAGNOSIS — R918 Other nonspecific abnormal finding of lung field: Secondary | ICD-10-CM

## 2017-07-31 MED ORDER — FLUTICASONE-UMECLIDIN-VILANT 100-62.5-25 MCG/INH IN AEPB: 1.0000 | INHALATION_SPRAY | Freq: Every day | RESPIRATORY_TRACT | 10 refills | Status: DC

## 2017-07-31 NOTE — Patient Instructions (Signed)
Start Trelegy inhaler once daily, rinse mouth after use.  Will check a CT chest in 2-3 months and follow up after that.

## 2017-10-22 ENCOUNTER — Ambulatory Visit: Payer: 59

## 2017-10-29 ENCOUNTER — Ambulatory Visit: Payer: 59 | Admitting: Internal Medicine

## 2019-04-22 DIAGNOSIS — R69 Illness, unspecified: Secondary | ICD-10-CM | POA: Diagnosis not present

## 2019-05-23 DIAGNOSIS — H40013 Open angle with borderline findings, low risk, bilateral: Secondary | ICD-10-CM | POA: Diagnosis not present

## 2019-09-21 DIAGNOSIS — M542 Cervicalgia: Secondary | ICD-10-CM | POA: Diagnosis not present

## 2019-10-11 DIAGNOSIS — M5137 Other intervertebral disc degeneration, lumbosacral region: Secondary | ICD-10-CM | POA: Diagnosis not present

## 2019-10-11 DIAGNOSIS — M545 Low back pain: Secondary | ICD-10-CM | POA: Diagnosis not present

## 2019-10-11 DIAGNOSIS — M9901 Segmental and somatic dysfunction of cervical region: Secondary | ICD-10-CM | POA: Diagnosis not present

## 2019-10-11 DIAGNOSIS — M9903 Segmental and somatic dysfunction of lumbar region: Secondary | ICD-10-CM | POA: Diagnosis not present

## 2019-10-12 DIAGNOSIS — M5137 Other intervertebral disc degeneration, lumbosacral region: Secondary | ICD-10-CM | POA: Diagnosis not present

## 2019-10-12 DIAGNOSIS — M545 Low back pain: Secondary | ICD-10-CM | POA: Diagnosis not present

## 2019-10-12 DIAGNOSIS — M9903 Segmental and somatic dysfunction of lumbar region: Secondary | ICD-10-CM | POA: Diagnosis not present

## 2019-10-12 DIAGNOSIS — M9901 Segmental and somatic dysfunction of cervical region: Secondary | ICD-10-CM | POA: Diagnosis not present

## 2019-10-13 DIAGNOSIS — M545 Low back pain: Secondary | ICD-10-CM | POA: Diagnosis not present

## 2019-10-13 DIAGNOSIS — M5137 Other intervertebral disc degeneration, lumbosacral region: Secondary | ICD-10-CM | POA: Diagnosis not present

## 2019-10-13 DIAGNOSIS — M9903 Segmental and somatic dysfunction of lumbar region: Secondary | ICD-10-CM | POA: Diagnosis not present

## 2019-10-13 DIAGNOSIS — M9901 Segmental and somatic dysfunction of cervical region: Secondary | ICD-10-CM | POA: Diagnosis not present

## 2019-10-14 DIAGNOSIS — M9901 Segmental and somatic dysfunction of cervical region: Secondary | ICD-10-CM | POA: Diagnosis not present

## 2019-10-14 DIAGNOSIS — M9903 Segmental and somatic dysfunction of lumbar region: Secondary | ICD-10-CM | POA: Diagnosis not present

## 2019-10-14 DIAGNOSIS — M545 Low back pain: Secondary | ICD-10-CM | POA: Diagnosis not present

## 2019-10-14 DIAGNOSIS — M5137 Other intervertebral disc degeneration, lumbosacral region: Secondary | ICD-10-CM | POA: Diagnosis not present

## 2019-10-17 DIAGNOSIS — M5137 Other intervertebral disc degeneration, lumbosacral region: Secondary | ICD-10-CM | POA: Diagnosis not present

## 2019-10-17 DIAGNOSIS — M545 Low back pain: Secondary | ICD-10-CM | POA: Diagnosis not present

## 2019-10-17 DIAGNOSIS — M9903 Segmental and somatic dysfunction of lumbar region: Secondary | ICD-10-CM | POA: Diagnosis not present

## 2019-10-17 DIAGNOSIS — M9901 Segmental and somatic dysfunction of cervical region: Secondary | ICD-10-CM | POA: Diagnosis not present

## 2019-10-18 DIAGNOSIS — M5137 Other intervertebral disc degeneration, lumbosacral region: Secondary | ICD-10-CM | POA: Diagnosis not present

## 2019-10-18 DIAGNOSIS — M545 Low back pain: Secondary | ICD-10-CM | POA: Diagnosis not present

## 2019-10-18 DIAGNOSIS — M9903 Segmental and somatic dysfunction of lumbar region: Secondary | ICD-10-CM | POA: Diagnosis not present

## 2019-10-18 DIAGNOSIS — M9901 Segmental and somatic dysfunction of cervical region: Secondary | ICD-10-CM | POA: Diagnosis not present

## 2019-10-20 DIAGNOSIS — M5137 Other intervertebral disc degeneration, lumbosacral region: Secondary | ICD-10-CM | POA: Diagnosis not present

## 2019-10-20 DIAGNOSIS — M9903 Segmental and somatic dysfunction of lumbar region: Secondary | ICD-10-CM | POA: Diagnosis not present

## 2019-10-20 DIAGNOSIS — M545 Low back pain: Secondary | ICD-10-CM | POA: Diagnosis not present

## 2019-10-20 DIAGNOSIS — M9901 Segmental and somatic dysfunction of cervical region: Secondary | ICD-10-CM | POA: Diagnosis not present

## 2019-10-21 ENCOUNTER — Encounter: Payer: Self-pay | Admitting: Family Medicine

## 2019-10-21 ENCOUNTER — Ambulatory Visit (INDEPENDENT_AMBULATORY_CARE_PROVIDER_SITE_OTHER): Payer: Medicare HMO | Admitting: Family Medicine

## 2019-10-21 ENCOUNTER — Other Ambulatory Visit: Payer: Self-pay

## 2019-10-21 VITALS — BP 136/82 | HR 87 | Temp 96.9°F | Resp 16 | Ht 70.0 in | Wt 117.8 lb

## 2019-10-21 DIAGNOSIS — M545 Low back pain: Secondary | ICD-10-CM | POA: Diagnosis not present

## 2019-10-21 DIAGNOSIS — R972 Elevated prostate specific antigen [PSA]: Secondary | ICD-10-CM | POA: Diagnosis not present

## 2019-10-21 DIAGNOSIS — M5137 Other intervertebral disc degeneration, lumbosacral region: Secondary | ICD-10-CM | POA: Diagnosis not present

## 2019-10-21 DIAGNOSIS — Z0000 Encounter for general adult medical examination without abnormal findings: Secondary | ICD-10-CM | POA: Diagnosis not present

## 2019-10-21 DIAGNOSIS — Z23 Encounter for immunization: Secondary | ICD-10-CM

## 2019-10-21 DIAGNOSIS — Z1159 Encounter for screening for other viral diseases: Secondary | ICD-10-CM

## 2019-10-21 DIAGNOSIS — E440 Moderate protein-calorie malnutrition: Secondary | ICD-10-CM | POA: Diagnosis not present

## 2019-10-21 DIAGNOSIS — M9901 Segmental and somatic dysfunction of cervical region: Secondary | ICD-10-CM | POA: Diagnosis not present

## 2019-10-21 DIAGNOSIS — M549 Dorsalgia, unspecified: Secondary | ICD-10-CM

## 2019-10-21 DIAGNOSIS — R634 Abnormal weight loss: Secondary | ICD-10-CM

## 2019-10-21 DIAGNOSIS — E785 Hyperlipidemia, unspecified: Secondary | ICD-10-CM

## 2019-10-21 DIAGNOSIS — R739 Hyperglycemia, unspecified: Secondary | ICD-10-CM

## 2019-10-21 DIAGNOSIS — R918 Other nonspecific abnormal finding of lung field: Secondary | ICD-10-CM

## 2019-10-21 DIAGNOSIS — Z1211 Encounter for screening for malignant neoplasm of colon: Secondary | ICD-10-CM | POA: Diagnosis not present

## 2019-10-21 DIAGNOSIS — Z125 Encounter for screening for malignant neoplasm of prostate: Secondary | ICD-10-CM

## 2019-10-21 DIAGNOSIS — Z87891 Personal history of nicotine dependence: Secondary | ICD-10-CM

## 2019-10-21 DIAGNOSIS — M9903 Segmental and somatic dysfunction of lumbar region: Secondary | ICD-10-CM | POA: Diagnosis not present

## 2019-10-21 DIAGNOSIS — J439 Emphysema, unspecified: Secondary | ICD-10-CM

## 2019-10-21 DIAGNOSIS — N400 Enlarged prostate without lower urinary tract symptoms: Secondary | ICD-10-CM | POA: Diagnosis not present

## 2019-10-21 MED ORDER — TRELEGY ELLIPTA 100-62.5-25 MCG/INH IN AEPB: 1.0000 | INHALATION_SPRAY | Freq: Every day | RESPIRATORY_TRACT | 0 refills | Status: DC

## 2019-10-21 NOTE — Progress Notes (Signed)
Name: Cody [name]   MRN: [id number]    DOB: 1955/09/10   Date:10/21/2019       Progress Note  Subjective  Chief Complaint  Chief Complaint  Patient presents with  . Annual Exam  . Follow-up    HPI  Patient presents for annual CPE and follow up  Malnutrition: he states he lost a lot of weight since this past Summer when his brother died from complications of liver/lung cancer and heart attack. He states he has noticed that he has early satiety, he is also busier at work, doing more physical work. I told him I am worried about cancer, with his history of lung nodules , emphysema and significant weight loss.   Emphysema and lung nodules: had a CT scan done in 2015 was referred to Dr. Felicie Morn and was taking Trelegy but lost to follow up and stopped medication because he had a gap in insurance and could not afford it. He has daily SOB, worse with physical activity. Also has intermittent cough.   BPH and elevated PSA: lost to follow up with Urologist, AUA positive and we will recheck PSA   Hyperglycemia: we will check A1C    USPSTF grade A and B recommendations:  Diet: eats small portions, dinner at home at night  Exercise: physical job   Depression: phq 9 is positive Depression screen [facility] 2/9 10/21/2019 03/16/2017 10/10/2015 07/16/2015  Decreased Interest 0 0 0 0  Down, Depressed, Hopeless 1 0 0 0  PHQ - 2 Score 1 0 0 0  Altered sleeping 0 - - -  Tired, decreased energy 0 - - -  Change in appetite 0 - - -  Feeling bad or failure about yourself  0 - - -  Trouble concentrating 0 - - -  Moving slowly or fidgety/restless 0 - - -  Suicidal thoughts 0 - - -  PHQ-9 Score 1 - - -  Difficult doing work/chores Not difficult at all - - -    Hypertension:  BP Readings from Last 3 Encounters:  10/21/19 136/82  07/31/17 120/70  03/16/17 138/78    Obesity: Wt Readings from Last 3 Encounters:  10/21/19 117 lb 12.8 oz (53.4 kg)  07/31/17 134 lb (60.8 kg)  03/16/17 139 lb 3 oz  (63.1 kg)   BMI Readings from Last 3 Encounters:  10/21/19 16.90 kg/m  07/31/17 19.23 kg/m  03/16/17 19.97 kg/m     Lipids:  Lab Results  Component Value Date   CHOL 174 03/16/2017   CHOL 208 (H) 10/15/2015   CHOL 188 10/11/2014   Lab Results  Component Value Date   HDL 93 03/16/2017   HDL 103 10/15/2015   HDL 106 (A) 10/11/2014   Lab Results  Component Value Date   LDLCALC 72 03/16/2017   LDLCALC 91 10/15/2015   LDLCALC 71 10/11/2014   Lab Results  Component Value Date   TRIG 45 03/16/2017   TRIG 68 10/15/2015   TRIG 55 10/11/2014   Lab Results  Component Value Date   CHOLHDL 1.9 03/16/2017   CHOLHDL 2.0 10/15/2015   No results found for: LDLDIRECT Glucose:  Glucose  Date Value Ref Range Status  10/15/2015 103 (H) 65 - 99 mg/dL Final   Glucose, Bld  Date Value Ref Range Status  03/16/2017 113 (H) 65 - 99 mg/dL Final      [facility] Visit from 10/21/2019 in [facility]  AUDIT-C Score  4       Married STD  testing and prevention (HIV/chl/gon/syphilis): N/A Hep C: today   Skin cancer: Discussed monitoring for atypical lesions Colorectal cancer: we will schedule it  Prostate cancer: recheck it  Lab Results  Component Value Date   PSA Abnormal 5.3 10/11/2014    IPSS Questionnaire (AUA-7): Over the past month.   1)  How often have you had a sensation of not emptying your bladder completely after you finish urinating?  4 - More than half the time  2)  How often have you had to urinate again less than two hours after you finished urinating? 0 - Not at all  3)  How often have you found you stopped and started again several times when you urinated?  5 - Almost always  4) How difficult have you found it to postpone urination?  3 - About half the time  5) How often have you had a weak urinary stream?  5 - Almost always  6) How often have you had to push or strain to begin urination?  0 - Not at all  7) How many times did you most  typically get up to urinate from the time you went to bed until the time you got up in the morning?  1 - 1 time  Total score:  0-7 mildly symptomatic   8-19 moderately symptomatic   20-35 severely symptomatic    Lung cancer: Low Dose CT Chest recommended if Age 75-80 years, 30 pack-year currently smoking OR have quit w/in 15years. Patient does qualify.   AAA: The USPSTF recommends one-time screening with ultrasonography in men ages 62 to 26 years who have ever smoked ECG:  02/2017  Advanced Care Planning: A voluntary discussion about advance care planning including the explanation and discussion of advance directives.  Discussed health care proxy and Living will, and the patient was able to identify a health care proxy as wife - Olegario Shearer .  Patient does not have a living will at present time.  Patient Active Problem List   Diagnosis Date Noted  . BPH with elevated PSA 07/16/2015  . COPD bronchitis 07/16/2015  . Seasonal and perennial allergic rhinitis 04/24/2010    Past Surgical History:  Procedure Laterality Date  . HERNIA REPAIR Bilateral 1982  . HERNIA REPAIR Right 1994    Family History  Problem Relation Age of Onset  . Heart disease Father   . CVA Mother     Social History   Socioeconomic History  . Marital status: Married    Spouse name: [name]   . Number of children: 4  . Years of education: Not on file  . Highest education level: Some college, no degree  Occupational History  . Occupation: dispatch and delivery   Tobacco Use  . Smoking status: Former Smoker    Packs/day: 2.00    Years: 30.00    Pack years: 60.00    Quit date: 07/29/2011    Years since quitting: 8.2  . Smokeless tobacco: Never Used  . Tobacco comment: pt vapes  Substance and Sexual Activity  . Alcohol use: Yes    Alcohol/week: 5.0 standard drinks    Types: 5 Standard drinks or equivalent per week    Comment: 1-2/day  . Drug use: No  . Sexual activity: Not Currently    Partners: Female  Other  Topics Concern  . Not on file  Social History Narrative  . Not on file   Social Determinants of Health   Financial Resource Strain: Low Risk   . Difficulty  of Paying Living Expenses: Not hard at all  Food Insecurity: No Food Insecurity  . Worried About Charity fundraiser in the Last Year: Never true  . Ran Out of Food in the Last Year: Never true  Transportation Needs: No Transportation Needs  . Lack of Transportation (Medical): No  . Lack of Transportation (Non-Medical): No  Physical Activity: Sufficiently Active  . Days of Exercise per Week: 5 days  . Minutes of Exercise per Session: 90 min  Stress: No Stress Concern Present  . Feeling of Stress : Not at all  Social Connections: Not Isolated  . Frequency of Communication with Friends and Family: More than three times a week  . Frequency of Social Gatherings with Friends and Family: More than three times a week  . Attends Religious Services: More than 4 times per year  . Active Member of Clubs or Organizations: Yes  . Attends Archivist Meetings: More than 4 times per year  . Marital Status: Married  Human resources officer Violence: Not At Risk  . Fear of Current or Ex-Partner: No  . Emotionally Abused: No  . Physically Abused: No  . Sexually Abused: No     Current Outpatient Medications:  .  Fluticasone-Umeclidin-Vilant (TRELEGY ELLIPTA) 100-62.5-25 MCG/INH AEPB, Inhale 1 applicator into the lungs daily. Rinse mouth after use., Disp: 60 each, Rfl: 0 .  tamsulosin (FLOMAX) 0.4 MG CAPS capsule, Take 1 capsule (0.4 mg total) by mouth daily. (Patient not taking: Reported on 10/21/2019), Disp: 90 capsule, Rfl: 3  No Known Allergies   ROS  Constitutional: Negative for fever , positive for  weight change.  Respiratory: seldom has a   cough , he continues to have shortness of breath.   Cardiovascular: Negative for chest pain or palpitations.  Gastrointestinal: Negative for abdominal pain, no bowel changes.   Musculoskeletal: Negative for gait problem or joint swelling.  Skin: Negative for rash.  Neurological: Negative for dizziness or headache.  No other specific complaints in a complete review of systems (except as listed in HPI above).   Objective  Vitals:   10/21/19 1339  BP: 136/82  Pulse: 87  Resp: 16  Temp: (!) 96.9 F (36.1 C)  TempSrc: Temporal  SpO2: 94%  Weight: 117 lb 12.8 oz (53.4 kg)  Height: 5\' 10"  (1.778 m)    Body mass index is 16.9 kg/m.  Physical Exam  Constitutional: Patient appears malnourished with temporal waisting   HENT: Head: Normocephalic and atraumatic. Ears: B TMs ok, no erythema or effusion; Nose: not done. Mouth/Throat: not done  Eyes: Conjunctivae and EOM are normal. Pupils are equal, round, and reactive to light. No scleral icterus.  Neck: Normal range of motion. Neck supple. No JVD present. No thyromegaly present.  Cardiovascular: Normal rate, regular rhythm and normal heart sounds.  No murmur heard. No BLE edema. Pulmonary/Chest: Effort normal and breath sounds normal. No respiratory distress. Abdominal: Soft. Bowel sounds are normal, no distension. There is no tenderness. no masses MALE GENITALIA: Normal descended testes bilaterally, no masses palpated, no hernias, no lesions, no discharge RECTAL: Prostate normal size and consistency, no rectal masses or hemorrhoids Musculoskeletal: decrease rom , having pain on anterior chest and upper back - seeing chiropractor ( Dr. Freddi Che)  Neurological: he is alert and oriented to person, place, and time. No cranial nerve deficit. Coordination, balance, strength, speech and gait are normal.  Skin: Skin is warm and dry. No rash noted. No erythema.  Psychiatric: Patient has a normal mood and  affect. behavior is normal. Judgment and thought content normal.  PHQ2/9: Depression screen [facility] 2/9 10/21/2019 03/16/2017 10/10/2015 07/16/2015  Decreased Interest 0 0 0 0  Down, Depressed, Hopeless 1 0 0 0  PHQ - 2  Score 1 0 0 0  Altered sleeping 0 - - -  Tired, decreased energy 0 - - -  Change in appetite 0 - - -  Feeling bad or failure about yourself  0 - - -  Trouble concentrating 0 - - -  Moving slowly or fidgety/restless 0 - - -  Suicidal thoughts 0 - - -  PHQ-9 Score 1 - - -  Difficult doing work/chores Not difficult at all - - -   Positive   Fall Risk: Fall Risk  10/21/2019 10/21/2019 03/16/2017 10/10/2015 07/16/2015  Falls in the past year? 0 0 No No No  Number falls in past yr: 0 0 - - -  Injury with Fall? 0 0 - - -     Functional Status Survey: Is the patient deaf or have difficulty hearing?: No Does the patient have difficulty seeing, even when wearing glasses/contacts?: No Does the patient have difficulty concentrating, remembering, or making decisions?: No Does the patient have difficulty walking or climbing stairs?: No Does the patient have difficulty dressing or bathing?: No Does the patient have difficulty doing errands alone such as visiting a doctor's office or shopping?: No    Assessment & Plan   1. Pulmonary emphysema, unspecified emphysema type ([place])  - Fluticasone-Umeclidin-Vilant (TRELEGY ELLIPTA) 100-62.5-25 MCG/INH AEPB; Inhale 1 applicator into the lungs daily. Rinse mouth after use.  Dispense: 60 each; Refill: 0 - Ambulatory referral to Pulmonology  2. Well adult exam   3. Need for vaccination with 13-polyvalent pneumococcal conjugate vaccine  - Pneumococcal conjugate vaccine 13-valent IM  4. Lung nodules  Did not get repeat CT because had a gap in insurance   5. Acute upper back pain  Seeing Chiropractor and doing better, work related injury dropped a heavy box   6. Moderate protein-calorie malnutrition ([place])  - CBC with Differential/Platelet - COMPLETE METABOLIC PANEL WITH GFR  7. Colon cancer screening  - Ambulatory referral to Gastroenterology  8. History of tobacco use  - VAS [place] AAA DUPLEX; Future  9. Prostate cancer screening  -  PSA  10. Weight loss  - TSH - VITAMIN D 25 Hydroxy (Vit-D Deficiency, Fractures) - Vitamin B12 - Hemoglobin A1c - C-reactive protein - Sedimentation rate  11. Need for hepatitis C screening test  - Hepatitis C antibody   -Prostate cancer screening and PSA options (with potential risks and benefits of testing vs not testing) were discussed along with recent recs/guidelines. -USPSTF grade A and B recommendations reviewed with patient; age-appropriate recommendations, preventive care, screening tests, etc discussed and encouraged; healthy living encouraged; see AVS for patient education given to patient -Discussed importance of 150 minutes of physical activity weekly, eat two servings of fish weekly, eat one serving of tree nuts ( cashews, pistachios, pecans, almonds.Marland Kitchen) every other day, eat 6 servings of fruit/vegetables daily and drink plenty of water and avoid sweet beverages.

## 2019-10-21 NOTE — Patient Instructions (Signed)

## 2019-10-22 LAB — CBC WITH DIFFERENTIAL/PLATELET
Basophils Absolute: 0.1 10*3/uL (ref 0.0–0.2)
Basos: 1 %
EOS (ABSOLUTE): 0 10*3/uL (ref 0.0–0.4)
Eos: 0 %
Hematocrit: 43 % (ref 37.5–51.0)
Hemoglobin: 15.2 g/dL (ref 13.0–17.7)
Immature Grans (Abs): 0 10*3/uL (ref 0.0–0.1)
Immature Granulocytes: 0 %
Lymphocytes Absolute: 1.3 10*3/uL (ref 0.7–3.1)
Lymphs: 16 %
MCH: 30.3 pg (ref 26.6–33.0)
MCHC: 35.3 g/dL (ref 31.5–35.7)
MCV: 86 fL (ref 79–97)
Monocytes Absolute: 0.6 10*3/uL (ref 0.1–0.9)
Monocytes: 8 %
Neutrophils Absolute: 5.9 10*3/uL (ref 1.4–7.0)
Neutrophils: 75 %
Platelets: 325 10*3/uL (ref 150–450)
RBC: 5.02 x10E6/uL (ref 4.14–5.80)
RDW: 12.3 % (ref 11.6–15.4)
WBC: 7.9 10*3/uL (ref 3.4–10.8)

## 2019-10-22 LAB — HEMOGLOBIN A1C
Est. average glucose Bld gHb Est-mCnc: 128 mg/dL
Hgb A1c MFr Bld: 6.1 % — ABNORMAL HIGH (ref 4.8–5.6)

## 2019-10-22 LAB — COMPREHENSIVE METABOLIC PANEL
ALT: 23 IU/L (ref 0–44)
AST: 29 IU/L (ref 0–40)
Albumin/Globulin Ratio: 1.1 — ABNORMAL LOW (ref 1.2–2.2)
Albumin: 3.6 g/dL — ABNORMAL LOW (ref 3.8–4.8)
Alkaline Phosphatase: 272 IU/L — ABNORMAL HIGH (ref 39–117)
BUN/Creatinine Ratio: 23 (ref 10–24)
BUN: 14 mg/dL (ref 8–27)
Bilirubin Total: 0.5 mg/dL (ref 0.0–1.2)
CO2: 28 mmol/L (ref 20–29)
Calcium: 9.2 mg/dL (ref 8.6–10.2)
Chloride: 95 mmol/L — ABNORMAL LOW (ref 96–106)
Creatinine, Ser: 0.62 mg/dL — ABNORMAL LOW (ref 0.76–1.27)
GFR calc Af Amer: 120 mL/min/{1.73_m2} (ref >59)
GFR calc non Af Amer: 104 mL/min/{1.73_m2} (ref >59)
Globulin, Total: 3.4 g/dL (ref 1.5–4.5)
Glucose: 94 mg/dL (ref 65–99)
Potassium: 4.7 mmol/L (ref 3.5–5.2)
Sodium: 135 mmol/L (ref 134–144)
Total Protein: 7 g/dL (ref 6.0–8.5)

## 2019-10-22 LAB — HEPATITIS C ANTIBODY: Hep C Virus Ab: <0.1 s/co ratio (ref 0.0–0.9)

## 2019-10-22 LAB — SEDIMENTATION RATE: Sed Rate: 48 mm/hr — ABNORMAL HIGH (ref 0–30)

## 2019-10-22 LAB — TSH: TSH: 2.93 u[IU]/mL (ref 0.450–4.500)

## 2019-10-22 LAB — PSA: Prostate Specific Ag, Serum: 8.2 ng/mL — ABNORMAL HIGH (ref 0.0–4.0)

## 2019-10-22 LAB — C-REACTIVE PROTEIN: CRP: 50 mg/L — ABNORMAL HIGH (ref 0–10)

## 2019-10-24 ENCOUNTER — Other Ambulatory Visit: Payer: Self-pay | Admitting: Family Medicine

## 2019-10-24 DIAGNOSIS — M545 Low back pain: Secondary | ICD-10-CM | POA: Diagnosis not present

## 2019-10-24 DIAGNOSIS — R972 Elevated prostate specific antigen [PSA]: Secondary | ICD-10-CM

## 2019-10-24 DIAGNOSIS — M9901 Segmental and somatic dysfunction of cervical region: Secondary | ICD-10-CM | POA: Diagnosis not present

## 2019-10-24 DIAGNOSIS — M9903 Segmental and somatic dysfunction of lumbar region: Secondary | ICD-10-CM | POA: Diagnosis not present

## 2019-10-24 DIAGNOSIS — N400 Enlarged prostate without lower urinary tract symptoms: Secondary | ICD-10-CM

## 2019-10-24 DIAGNOSIS — M5137 Other intervertebral disc degeneration, lumbosacral region: Secondary | ICD-10-CM | POA: Diagnosis not present

## 2019-10-25 DIAGNOSIS — M5137 Other intervertebral disc degeneration, lumbosacral region: Secondary | ICD-10-CM | POA: Diagnosis not present

## 2019-10-25 DIAGNOSIS — M545 Low back pain: Secondary | ICD-10-CM | POA: Diagnosis not present

## 2019-10-25 DIAGNOSIS — M9903 Segmental and somatic dysfunction of lumbar region: Secondary | ICD-10-CM | POA: Diagnosis not present

## 2019-10-25 DIAGNOSIS — M9901 Segmental and somatic dysfunction of cervical region: Secondary | ICD-10-CM | POA: Diagnosis not present

## 2019-10-26 DIAGNOSIS — M545 Low back pain: Secondary | ICD-10-CM | POA: Diagnosis not present

## 2019-10-26 DIAGNOSIS — M9903 Segmental and somatic dysfunction of lumbar region: Secondary | ICD-10-CM | POA: Diagnosis not present

## 2019-10-26 DIAGNOSIS — M5137 Other intervertebral disc degeneration, lumbosacral region: Secondary | ICD-10-CM | POA: Diagnosis not present

## 2019-10-26 DIAGNOSIS — M9901 Segmental and somatic dysfunction of cervical region: Secondary | ICD-10-CM | POA: Diagnosis not present

## 2019-10-27 DIAGNOSIS — M5137 Other intervertebral disc degeneration, lumbosacral region: Secondary | ICD-10-CM | POA: Diagnosis not present

## 2019-10-27 DIAGNOSIS — M9901 Segmental and somatic dysfunction of cervical region: Secondary | ICD-10-CM | POA: Diagnosis not present

## 2019-10-27 DIAGNOSIS — M545 Low back pain: Secondary | ICD-10-CM | POA: Diagnosis not present

## 2019-10-27 DIAGNOSIS — C3411 Malignant neoplasm of upper lobe, right bronchus or lung: Secondary | ICD-10-CM

## 2019-10-27 DIAGNOSIS — M9903 Segmental and somatic dysfunction of lumbar region: Secondary | ICD-10-CM | POA: Diagnosis not present

## 2019-10-27 HISTORY — DX: Malignant neoplasm of upper lobe, right bronchus or lung: C34.11

## 2019-10-31 ENCOUNTER — Emergency Department
Admission: EM | Admit: 2019-10-31 | Discharge: 2019-10-31 | Disposition: A | Discharge status: Other Acute Inpatient Hospital | Payer: Medicare HMO | Attending: Emergency Medicine | Admitting: Emergency Medicine

## 2019-10-31 ENCOUNTER — Emergency Department: Payer: Medicare HMO

## 2019-10-31 ENCOUNTER — Other Ambulatory Visit: Payer: Self-pay | Admitting: Family Medicine

## 2019-10-31 ENCOUNTER — Encounter: Payer: Self-pay | Admitting: Emergency Medicine

## 2019-10-31 ENCOUNTER — Other Ambulatory Visit: Payer: Self-pay

## 2019-10-31 DIAGNOSIS — D434 Neoplasm of uncertain behavior of spinal cord: Secondary | ICD-10-CM | POA: Insufficient documentation

## 2019-10-31 DIAGNOSIS — M5489 Other dorsalgia: Secondary | ICD-10-CM | POA: Diagnosis not present

## 2019-10-31 DIAGNOSIS — S199XXA Unspecified injury of neck, initial encounter: Secondary | ICD-10-CM | POA: Diagnosis not present

## 2019-10-31 DIAGNOSIS — R531 Weakness: Secondary | ICD-10-CM | POA: Diagnosis not present

## 2019-10-31 DIAGNOSIS — N400 Enlarged prostate without lower urinary tract symptoms: Secondary | ICD-10-CM | POA: Diagnosis not present

## 2019-10-31 DIAGNOSIS — Z20822 Contact with and (suspected) exposure to covid-19: Secondary | ICD-10-CM | POA: Insufficient documentation

## 2019-10-31 DIAGNOSIS — D497 Neoplasm of unspecified behavior of endocrine glands and other parts of nervous system: Secondary | ICD-10-CM

## 2019-10-31 DIAGNOSIS — R0902 Hypoxemia: Secondary | ICD-10-CM | POA: Diagnosis not present

## 2019-10-31 DIAGNOSIS — R279 Unspecified lack of coordination: Secondary | ICD-10-CM | POA: Diagnosis not present

## 2019-10-31 DIAGNOSIS — C7951 Secondary malignant neoplasm of bone: Secondary | ICD-10-CM | POA: Diagnosis not present

## 2019-10-31 DIAGNOSIS — Z87891 Personal history of nicotine dependence: Secondary | ICD-10-CM | POA: Insufficient documentation

## 2019-10-31 DIAGNOSIS — Z03818 Encounter for observation for suspected exposure to other biological agents ruled out: Secondary | ICD-10-CM | POA: Diagnosis not present

## 2019-10-31 DIAGNOSIS — S3992XA Unspecified injury of lower back, initial encounter: Secondary | ICD-10-CM | POA: Diagnosis not present

## 2019-10-31 DIAGNOSIS — R200 Anesthesia of skin: Secondary | ICD-10-CM | POA: Diagnosis present

## 2019-10-31 DIAGNOSIS — J439 Emphysema, unspecified: Secondary | ICD-10-CM | POA: Diagnosis not present

## 2019-10-31 DIAGNOSIS — R262 Difficulty in walking, not elsewhere classified: Secondary | ICD-10-CM | POA: Diagnosis not present

## 2019-10-31 DIAGNOSIS — R9431 Abnormal electrocardiogram [ECG] [EKG]: Secondary | ICD-10-CM | POA: Diagnosis not present

## 2019-10-31 DIAGNOSIS — R52 Pain, unspecified: Secondary | ICD-10-CM | POA: Diagnosis not present

## 2019-10-31 DIAGNOSIS — M546 Pain in thoracic spine: Secondary | ICD-10-CM | POA: Diagnosis not present

## 2019-10-31 DIAGNOSIS — R2681 Unsteadiness on feet: Secondary | ICD-10-CM

## 2019-10-31 DIAGNOSIS — M549 Dorsalgia, unspecified: Secondary | ICD-10-CM | POA: Diagnosis not present

## 2019-10-31 DIAGNOSIS — K769 Liver disease, unspecified: Secondary | ICD-10-CM | POA: Diagnosis not present

## 2019-10-31 DIAGNOSIS — Z743 Need for continuous supervision: Secondary | ICD-10-CM | POA: Diagnosis not present

## 2019-10-31 DIAGNOSIS — R918 Other nonspecific abnormal finding of lung field: Secondary | ICD-10-CM

## 2019-10-31 LAB — CBC WITH DIFFERENTIAL/PLATELET
Abs Immature Granulocytes: 0.03 10*3/uL (ref 0.00–0.07)
Basophils Absolute: 0.1 10*3/uL (ref 0.0–0.1)
Basophils Relative: 1 %
Eosinophils Absolute: 0 10*3/uL (ref 0.0–0.5)
Eosinophils Relative: 0 %
HCT: 45.4 % (ref 39.0–52.0)
Hemoglobin: 14.6 g/dL (ref 13.0–17.0)
Immature Granulocytes: 0 %
Lymphocytes Relative: 14 %
Lymphs Abs: 1.2 10*3/uL (ref 0.7–4.0)
MCH: 29.6 pg (ref 26.0–34.0)
MCHC: 32.2 g/dL (ref 30.0–36.0)
MCV: 92.1 fL (ref 80.0–100.0)
Monocytes Absolute: 0.6 10*3/uL (ref 0.1–1.0)
Monocytes Relative: 7 %
Neutro Abs: 7.1 10*3/uL (ref 1.7–7.7)
Neutrophils Relative %: 78 %
Platelets: 372 10*3/uL (ref 150–400)
RBC: 4.93 MIL/uL (ref 4.22–5.81)
RDW: 13.8 % (ref 11.5–15.5)
WBC: 9.1 10*3/uL (ref 4.0–10.5)
nRBC: 0 % (ref 0.0–0.2)

## 2019-10-31 LAB — BASIC METABOLIC PANEL
Anion gap: 9 (ref 5–15)
BUN: 14 mg/dL (ref 8–23)
CO2: 23 mmol/L (ref 22–32)
Calcium: 8.8 mg/dL — ABNORMAL LOW (ref 8.9–10.3)
Chloride: 100 mmol/L (ref 98–111)
Creatinine, Ser: 0.58 mg/dL — ABNORMAL LOW (ref 0.61–1.24)
GFR calc Af Amer: >60 mL/min (ref >60)
GFR calc non Af Amer: >60 mL/min (ref >60)
Glucose, Bld: 155 mg/dL — ABNORMAL HIGH (ref 70–99)
Potassium: 4.2 mmol/L (ref 3.5–5.1)
Sodium: 132 mmol/L — ABNORMAL LOW (ref 135–145)

## 2019-10-31 LAB — POC SARS CORONAVIRUS 2 AG: SARS Coronavirus 2 Ag: NEGATIVE

## 2019-10-31 MED ORDER — IOHEXOL 9 MG/ML PO SOLN
500.0000 mL | Freq: Two times a day (BID) | ORAL | Status: DC | PRN
  Administered 2019-10-31 (×2): 500 mL via ORAL

## 2019-10-31 MED ORDER — IOHEXOL 300 MG/ML  SOLN
75.0000 mL | Freq: Once | INTRAMUSCULAR | Status: AC | PRN
  Administered 2019-10-31: 75 mL via INTRAVENOUS

## 2019-10-31 NOTE — ED Notes (Signed)
EMTALA and Medical necessity documentation reviewed and found to be complete per policy. E-signature obtained for consent to transfer.

## 2019-10-31 NOTE — ED Notes (Signed)
Room 15 [facility]. Report to Science writer.

## 2019-10-31 NOTE — ED Provider Notes (Addendum)
[facility] Emergency Department Provider Note   ____________________________________________   First MD Initiated Contact with Patient 10/31/19 1603     (approximate)  I have reviewed the triage vital signs and the nursing notes.   HISTORY  Chief Complaint Numbness    HPI Cody [name] is a 64 y.o. male here for evaluation of difficulty with walking and numbness  Patient reports little over a month ago he is injured a cart that weighed about 80 pounds fell on him at work, and he has been having upper back pain since then.  Seen a chiropractor had images by them, they never found any fracture.  To be doing chiropractics and is noticed over the last week or so that may be 2 weeks that he started to get numbness for about the middle of his upper body all the way down to his feet.  Is also been feeling like he having difficulty walking, having to use a walker now, feels like it is worse in his left leg than his right but he does feel weak as well.  The symptoms are all new for him in the last couple weeks time.  No recent illnesses no fevers no chills.  No headache.  No difficulty with his speech no facial droop.  No problems with his arms, but reports all the issues seem to be centered on weakness in his lower legs mostly on the left and numbness from about the middle of his back down all the way to his feet on both sides   Past Medical History:  Diagnosis Date  . Allergy   . COPD (chronic obstructive pulmonary disease) ([place])   . Prostate enlargement     Patient Active Problem List   Diagnosis Date Noted  . BPH with elevated PSA 07/16/2015  . COPD bronchitis 07/16/2015  . Seasonal and perennial allergic rhinitis 04/24/2010    Past Surgical History:  Procedure Laterality Date  . HERNIA REPAIR Bilateral 1982  . HERNIA REPAIR Right 1994    Prior to Admission medications   Not on File    Allergies Patient has no known allergies.  Family History   Problem Relation Age of Onset  . Heart disease Father   . CVA Mother     Social History Social History   Tobacco Use  . Smoking status: Former Smoker    Packs/day: 2.00    Years: 30.00    Pack years: 60.00    Quit date: 07/29/2011    Years since quitting: 8.2  . Smokeless tobacco: Never Used  . Tobacco comment: pt vapes  Substance Use Topics  . Alcohol use: Yes    Alcohol/week: 5.0 standard drinks    Types: 5 Standard drinks or equivalent per week    Comment: 1-2/day  . Drug use: No    Review of Systems Constitutional: No fever/chills Eyes: No visual changes. ENT: No sore throat. Cardiovascular: Denies chest pain. Respiratory: Denies shortness of breath. Gastrointestinal: No abdominal pain.   Genitourinary: Negative for dysuria. Musculoskeletal: No neck pain.  Reports pain in his middle upper back between his shoulder blades Skin: Negative for rash. Neurological: See HPI    ____________________________________________   PHYSICAL EXAM:  VITAL SIGNS: ED Triage Vitals  Enc Vitals Group     BP 10/31/19 1305 139/84     Pulse Rate 10/31/19 1305 90     Resp 10/31/19 1305 20     Temp 10/31/19 1305 98.4 F (36.9 C)  Temp Source 10/31/19 1305 Oral     SpO2 10/31/19 1305 96 %     Weight 10/31/19 1306 120 lb (54.4 kg)     Height 10/31/19 1306 5\' 9"  (1.753 m)     Head Circumference --      Peak Flow --      Pain Score 10/31/19 1313 7     Pain Loc --      Pain Edu? --      Excl. in [place]? --     Constitutional: Alert and oriented. Well appearing and in no acute distress. Eyes: Conjunctivae are normal. Head: Atraumatic. Nose: No congestion/rhinnorhea. Mouth/Throat: Mucous membranes are moist. Neck: No stridor.  Cardiovascular: Normal rate, regular rhythm. Grossly normal heart sounds.  Good peripheral circulation. Respiratory: Normal respiratory effort.  No retractions. Lungs CTAB. Gastrointestinal: Soft and nontender. No distention. Musculoskeletal: No lower  extremity tenderness nor edema. Neurologic:  Normal speech and language. No gross focal neurologic deficits are appreciated except on careful inspection he does seem to have 4-5 strength on the left lower leg, 5 out of 5 right lower leg.  He does have diminished sensation from about the mid thoracic region down on both sides all the way to his feet he reports able to feel sensation but it seems diminished.  Full and normal strength and normal use of the upper arms bilateral.  Strong dorsalis pedis pulses with normal capillary refill of the feet bilateral. Skin:  Skin is warm, dry and intact. No rash noted. Psychiatric: Mood and affect are normal. Speech and behavior are normal.  ____________________________________________   LABS (all labs ordered are listed, but only abnormal results are displayed)  Labs Reviewed  BASIC METABOLIC PANEL - Abnormal; Notable for the following components:      Result Value   Sodium 132 (*)    Glucose, Bld 155 (*)    Creatinine, Ser 0.58 (*)    Calcium 8.8 (*)    All other components within normal limits  CBC WITH DIFFERENTIAL/PLATELET  POC SARS CORONAVIRUS 2 AG -  ED   ____________________________________________  EKG  Revewed and interpreted by me at 1325 HR 90 PR 106 QRS 100 QTC 460 Sinus, nonspecific t wave abnormality, incomplete RBBB ____________________________________________  RADIOLOGY  CT Chest W Contrast  Result Date: 10/31/2019 CLINICAL DATA:  History of trauma with subsequent upper back pain. EXAM: CT CHEST, ABDOMEN, AND PELVIS WITH CONTRAST TECHNIQUE: Multidetector CT imaging of the chest, abdomen and pelvis was performed following the standard protocol during bolus administration of intravenous contrast. CONTRAST:  22mL OMNIPAQUE IOHEXOL 300 MG/ML  SOLN COMPARISON:  May 26, 2017 FINDINGS: CT CHEST FINDINGS Cardiovascular: There is moderate severity calcification of the aortic arch. Normal heart size. No pericardial effusion.  Moderate to marked severity coronary artery calcification is seen. Mediastinum/Nodes: No enlarged mediastinal, hilar, or axillary lymph nodes. Thyroid gland, trachea, and esophagus demonstrate no significant findings. Lungs/Pleura: There is marked severity emphysematous lung disease. There is stable moderate to marked severity right apical scarring and/or atelectasis. A predominant stable 2.2 cm x 1.7 cm irregular appearing area of low attenuation is seen within the anterior aspect of the right upper lobe (axial CT image 38, CT series number 4). A stable, similar appearing 1.4 cm x 0.9 cm area is noted within the posterior aspect of the right upper lobe (axial CT image 30, CT series number 4). A new 5.6 cm x 3.1 cm heterogeneous low-attenuation soft tissue mass is seen within the posterior aspect of the right  upper lobe. This extends into the adjacent pleura and chest wall with subsequent cortical destruction of the posterior aspect of the fourth right rib. A 4.8 cm x 5.2 cm heterogeneous low-attenuation soft tissue mass is seen along the posteromedial aspect of the right upper lobe with extension to involve the adjacent pleura and chest wall. Cortical destruction of the posteromedial aspect of the third right rib is seen with cortical destruction of the T3 and T4 vertebral bodies also noted. This soft tissue mass extends into the spinal canal with subsequent marked severity spinal canal stenosis and spinal cord impingement. Musculoskeletal: Areas of cortical destruction, as described above, involving the posteromedial aspect of the third right rib, posterior aspect of the fourth right rib, T3 vertebral body and T4 vertebral body. A pathologic compression fracture deformity of the T3 vertebral body is also seen with mild posterior retropulsion of fracture fragments. CT ABDOMEN PELVIS FINDINGS Hepatobiliary: Multiple heterogeneous low-attenuation liver lesions of various sizes are seen scattered through the right and  left lobes. No gallstones, gallbladder wall thickening, or biliary dilatation. Pancreas: Unremarkable. No pancreatic ductal dilatation or surrounding inflammatory changes. Spleen: Normal in size without focal abnormality. Adrenals/Urinary Tract: Adrenal glands are unremarkable. Kidneys are normal in size, without renal calculi or hydronephrosis. Focal cortical scarring is seen along the posterior aspect of the mid left kidney. Bladder is unremarkable. Stomach/Bowel: Stomach is within normal limits. The appendix is not clearly identified. No evidence of bowel wall thickening, distention, or inflammatory changes. Vascular/Lymphatic: Marked severity aortic calcification. No enlarged abdominal or pelvic lymph nodes. Reproductive: The prostate gland is moderately enlarged Other: Multiple surgical clips are seen along the right inguinal region. Musculoskeletal: Multilevel degenerative changes seen throughout the lumbar spine. IMPRESSION: 1. Heterogeneous low-attenuation soft tissue masses within the posterior and posteromedial aspects of the right upper lobe, with extension to the adjacent pleura and chest wall, and associated cortical destruction of the posterior aspect of the third right rib, posterior fourth right rib, T3 and T4 vertebral bodies. Extension into the spinal canal with subsequent marked severity narrowing and spinal cord impingement is seen. MRI correlation is recommended. 2. Pathologic compression fracture deformity of the T3 vertebral body which likely represents sequelae from the patient's recent upper back trauma. 3. Stable, irregular appearing areas of low attenuation within the anterior and posterior aspects of the right upper lobe, which may represent areas of scarring, however malignancy cannot be excluded. 4. Multiple heterogeneous low-attenuation liver lesions of various sizes, consistent with metastatic disease. 5. Stable moderate to marked severity right apical scarring and/or atelectasis. 6.  Advanced coronary artery calcification. 7. Moderate to marked severity aortic calcification. 8. Enlarged prostate gland. 9. Degenerative changes of the lumbar spine. Aortic Atherosclerosis (ICD10-I70.0) and Emphysema (ICD10-J43.9). Electronically Signed   By: Virgina Norfolk M.D.   On: 10/31/2019 21:11   MR Cervical Spine Wo Contrast  Result Date: 10/31/2019 CLINICAL DATA:  64 year old male with recent upper back pain thought related to work injury. Increasing numbness in both legs since last week. EXAM: MRI CERVICAL SPINE WITHOUT CONTRAST TECHNIQUE: Multiplanar, multisequence MR imaging of the cervical spine was performed. No intravenous contrast was administered. COMPARISON:  Thoracic spine MRI today reported separately. FINDINGS: Alignment: Relatively preserved cervical lordosis. No spondylolisthesis. Vertebrae: No convincing cervical vertebral marrow edema or marrow lesion. However, there is a large right apical lung and/or upper thoracic paravertebral mass partially visible (series 19 image 7) with abnormal marrow signal in the T1 and T2 levels. See comparison. Cord: Normal cervical spinal cord.  See thoracic spinal cord details reported separately. Posterior Fossa, vertebral arteries, paraspinal tissues: Partially visible large right thoracic inlet soft tissue mass. See thoracic spine MRI. Preserved major vascular flow voids in the neck, the left vertebral artery appears dominant. No cervical lymphadenopathy is evident. Cervicomedullary junction is within normal limits. Grossly negative visible brain parenchyma. Disc levels: Cervical spine degeneration most notable for: C4-C5: Small central disc protrusion with moderate facet hypertrophy and endplate spurring greater on the right. Stenosis there is moderate right C5 foraminal. C5-C6: Disc bulge and endplate spurring with mild to moderate facet hypertrophy. Mild ligament flavum hypertrophy. No significant spinal stenosis. Moderate to severe left and  mild-to-moderate right C6 foraminal stenosis. C6-C7: Circumferential disc osteophyte complex. Broad-based posterior component with mild to moderate facet and ligament flavum hypertrophy. Borderline to mild spinal stenosis. Severe left greater than right C7 foraminal stenosis. IMPRESSION: 1. Partially visible large right thoracic inlet and/or paravertebral soft tissue tumor. 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 See Thoracic Spine MRI reported separately. 2. No involvement of the cervical spine or spinal cord. 3. There is lower cervical spine degeneration resulting in up to mild spinal stenosis, and moderate/severe foraminal stenosis at the right C5, left C6 and bilateral C7 nerve levels. Electronically Signed   By: Genevie Ann M.D.   On: 10/31/2019 18:49   MR THORACIC SPINE WO CONTRAST  Result Date: 10/31/2019 CLINICAL DATA:  64 year old male with recent upper back pain thought related to work injury. Increasing numbness in both legs since last week. EXAM: MRI THORACIC SPINE WITHOUT CONTRAST TECHNIQUE: Multiplanar, multisequence MR imaging of the thoracic spine was performed. No intravenous contrast was administered. COMPARISON:  Cervical spine MRI today reported separately. Chest CT 05/26/2017. FINDINGS: Alignment: Normal cervicothoracic junction alignment. Mildly exaggerated upper thoracic kyphosis in conjunction with the pathologic findings below. Subtle anterolisthesis of T2 on T3. Vertebrae: Pathologic compression fracture of T3 with vertebra plana. Abnormal marrow signal indicative of vertebral tumor and/or marrow edema also throughout the T2, T4, and right T5 vertebrae. Involvement of the posterior ribs at those levels, and additional bulky tumor (up to nearly 3 cm diameter) tracking along the skin right posterolateral 4th rib (series 20, image 13). Tumor invading and destroying the upper thoracic spine epicenter at T3 encompasses up to 7.4 cm diameter. See series 20, image 9 and series 16, image  5. Epidural invasion, foraminal invasion, and cord compression at that level (see below). The right T2 through T4 neural foramina are obliterated. There is also tumor in the left T3 foramen. The T1 vertebra is relatively spared at this time although there is tumor in the T1-T2 interspinous space. The T6 through T12 vertebrae appear spared. Cord: Tumor throughout the spinal canal from the T2 to the T4 level with cord compression maximal at T3 (series 20, image 9) where the cord is compressed to about 3-4 mm AP. There is subtle associated abnormal cord signal. Below the mid T4 level thoracic cord signal and morphology is normal. Normal conus at T12-L1. Paraspinal and other soft tissues: Bulky right paraspinal tumor in the upper thoracic spine and posterior right 4th rib space as detailed above. There is superimposed chronic right apical lung disease, and there might be additional tumor in the anterior right lung periphery as seen on series 20, image 6. Disc levels: Malignant findings in the upper thoracic spine as stated above. No superimposed age advanced thoracic spine degeneration. IMPRESSION: 1. T3 level right paraspinal bulky and destructive soft tissue tumor, 7.4  cm diameter. T3 pathologic fracture with vertebra plana. Widespread epidural tumor extension with SPINAL CORD COMPRESSION and mild cord edema maximal at T3. Tumor obliterating the bilateral T3 and right T2 and T4 neural foramina. Bulky tumor also growing laterally along the right 4th rib. 2. Top differential considerations are Lung Cancer and Plasmacytoma. Lymphoma is also considered but felt less likely. Critical Value/emergent results were called by telephone at the time of interpretation on 10/31/2019 at 6:58 pm to Dr. Delman Kitten , who verbally acknowledged these results. Electronically Signed   By: Genevie Ann M.D.   On: 10/31/2019 19:03   MR LUMBAR SPINE WO CONTRAST  Result Date: 10/31/2019 CLINICAL DATA:  64 year old male with recent upper back pain  thought related to work injury. Increasing numbness in both legs since last week. EXAM: MRI LUMBAR SPINE WITHOUT CONTRAST TECHNIQUE: Multiplanar, multisequence MR imaging of the lumbar spine was performed. No intravenous contrast was administered. COMPARISON:  Thoracic spine MRI today reported separately. FINDINGS: Segmentation:  Lumbar segmentation appears to be normal. Alignment:  Relatively preserved lumbar lordosis. Vertebrae: Visualized bone marrow signal is within normal limits. No lumbar or visible sacral marrow edema or suspicious marrow lesion. Conus medullaris and cauda equina: Conus extends to the T12-L1 level. No lower spinal cord or conus signal abnormality. Normal noncontrast appearance of the cauda equina nerve roots. Paraspinal and other soft tissues: Visualized noncontrast abdominal viscera and paraspinal soft tissues are within normal limits. Disc levels: No significant lumbar spinal stenosis. There is chronic disc and endplate degeneration J8-A4 through L4-L5 with some superimposed lumbar facet hypertrophy. IMPRESSION: 1. No acute or metastatic process identified in the lumbar spine. See Thoracic Spine MRI today reported separately. 2. Lumbar spine degeneration without significant spinal stenosis. Electronically Signed   By: Genevie Ann M.D.   On: 10/31/2019 19:13     MRI scans discussed with Dr. Nevada Crane, also reviewed with Dr. Lacinda Axon ____________________________________________   PROCEDURES  Procedure(s) performed: None  Procedures  Critical Care performed: No  ____________________________________________   INITIAL IMPRESSION / ASSESSMENT AND PLAN / ED COURSE  Pertinent labs & imaging results that were available during my care of the patient were reviewed by me and considered in my medical decision making (see chart for details).   Paresthesias from about the mid thoracic region down bilateral as well as weakness left leg greater than right and difficulty walking worsening over the  last 2 weeks with associated upper back pain is been present since the time of an injury about a month ago  Discussed the case with Dr. Lacinda Axon.  Neurosurgery will see the patient in consult and recommend agree with plan to obtain MRI of the spine which has been ordered.  Patient does not wish for anything for pain medication or anxiolysis.  He reports he is comfortable but would definitely like to further diagnostic work done  No associated infectious symptoms.  No symptoms that would suggest stroke, he reports bilateral paresthesias and no involvement of the face or arms.  Does not appear to be ascending in nature.   Clinical Course as of Oct 31 2111  Mon Oct 31, 2019  1622 Dr. Lacinda Axon will see in consult   [MQ]  1910 Dr. Lacinda Axon reviewing MRI   [MQ]  39 MRI discussed with radiologist Dr. Nevada Crane for concerns of large encroaching tumor   [MQ]  1938 Discussed with Dr. Lacinda Axon, he would like for [place] to obtain CTs chest abdomen pelvis to assess for additional tumors or primary.  Dr.  Lacinda Axon currently working with his associates at [employer] to evaluate for what the appropriate treatment course shall be for this evening including possible hospitalization needs   [MQ]  2016 Have spoken with patient as well as [facility] transfer center.  Patient comfortable with plan to transfer to [facility], Dr. Lacinda Axon has spoken with neurosurgery attending Dr. Wenda Overland; and we anticipate the patient will be accepted to his service.   [MQ]  2031 Radiology tech will powershare CTs and MRIs to [facility]. Discussed with Santiago Glad (CT tech)   [MQ]    Clinical Course User Index [MQ] Delman Kitten, MD     ____________________________________________   FINAL CLINICAL IMPRESSION(S) / ED DIAGNOSES  Final diagnoses:  Spinal cord tumor        Note:  This document was prepared using Dragon voice recognition software and may include unintentional dictation errors  ----------------------------------------- 9:13 PM on  10/31/2019 -----------------------------------------  Ongoing care and disposition assigned to Dr. Ellender Hose.  Patient currently awaiting transfer to [facility], awaiting callback and acceptance from neurosurgery at [facility]. Dr. Ellender Hose will follow-up with [facility] regarding transfer and need to get acceptance.      Delman Kitten, MD 10/31/19 2117    Delman Kitten, MD 11/08/19 1349

## 2019-10-31 NOTE — ED Notes (Addendum)
EDP Quale at bedside. See triage note. Pt currently A&Ox4. Reports numbness in legs and sometimes in upper back. Pt resting calmly in bed. Able to move arms and legs appropriately but has weakness in legs. Pt declined warm blanket. Bed locked low. Rail up. Call bell within reach.

## 2019-10-31 NOTE — ED Provider Notes (Signed)
Dr. Laren Everts at [facility] has accepted at [facility].  Patient has been stable here.  EMTALA completed.   Duffy Bruce, MD 10/31/19 2249

## 2019-10-31 NOTE — ED Notes (Signed)
Discussed with dr Charna Archer.  Will do basic labs and pt can get MRI once seen by EDP

## 2019-10-31 NOTE — ED Triage Notes (Addendum)
Pt had injury at work hurting upper back. Has been having adjustments at the chiropractor for 2 weeks. Last adjustment was last week Thursday.  Since then pt reports he has had numbness in both legs and been unsteady on his legs.  Has had to use walking device because so unsteady but normally walks without assistance. Today has had to have wife help as well because legs won't hold him up.  Has numb/tingling feeling from chest down.  Pt denies loss bowel or bladder.

## 2019-10-31 NOTE — ED Notes (Signed)
MRI staff to bedside.

## 2019-10-31 NOTE — ED Notes (Signed)
Cody [name] from CT to notify pt done with contrast. Pt stated he was only able to drink one and 1/4 bottles worth.

## 2019-11-01 DIAGNOSIS — E871 Hypo-osmolality and hyponatremia: Secondary | ICD-10-CM | POA: Diagnosis not present

## 2019-11-01 DIAGNOSIS — E222 Syndrome of inappropriate secretion of antidiuretic hormone: Secondary | ICD-10-CM | POA: Diagnosis not present

## 2019-11-01 DIAGNOSIS — G9529 Other cord compression: Secondary | ICD-10-CM | POA: Diagnosis not present

## 2019-11-01 DIAGNOSIS — C801 Malignant (primary) neoplasm, unspecified: Secondary | ICD-10-CM | POA: Diagnosis not present

## 2019-11-01 DIAGNOSIS — C787 Secondary malignant neoplasm of liver and intrahepatic bile duct: Secondary | ICD-10-CM | POA: Diagnosis not present

## 2019-11-01 DIAGNOSIS — K769 Liver disease, unspecified: Secondary | ICD-10-CM | POA: Diagnosis not present

## 2019-11-01 DIAGNOSIS — Z981 Arthrodesis status: Secondary | ICD-10-CM | POA: Diagnosis not present

## 2019-11-01 DIAGNOSIS — E43 Unspecified severe protein-calorie malnutrition: Secondary | ICD-10-CM | POA: Diagnosis not present

## 2019-11-01 DIAGNOSIS — M8458XA Pathological fracture in neoplastic disease, other specified site, initial encounter for fracture: Secondary | ICD-10-CM | POA: Diagnosis not present

## 2019-11-01 DIAGNOSIS — C7951 Secondary malignant neoplasm of bone: Secondary | ICD-10-CM | POA: Diagnosis not present

## 2019-11-01 DIAGNOSIS — Z85118 Personal history of other malignant neoplasm of bronchus and lung: Secondary | ICD-10-CM | POA: Diagnosis not present

## 2019-11-01 DIAGNOSIS — C3492 Malignant neoplasm of unspecified part of left bronchus or lung: Secondary | ICD-10-CM | POA: Diagnosis not present

## 2019-11-01 DIAGNOSIS — M47814 Spondylosis without myelopathy or radiculopathy, thoracic region: Secondary | ICD-10-CM | POA: Diagnosis not present

## 2019-11-01 DIAGNOSIS — J449 Chronic obstructive pulmonary disease, unspecified: Secondary | ICD-10-CM | POA: Diagnosis not present

## 2019-11-01 DIAGNOSIS — R64 Cachexia: Secondary | ICD-10-CM | POA: Diagnosis not present

## 2019-11-01 DIAGNOSIS — C3490 Malignant neoplasm of unspecified part of unspecified bronchus or lung: Secondary | ICD-10-CM | POA: Diagnosis not present

## 2019-11-01 DIAGNOSIS — Z681 Body mass index (BMI) 19 or less, adult: Secondary | ICD-10-CM | POA: Diagnosis not present

## 2019-11-01 DIAGNOSIS — G959 Disease of spinal cord, unspecified: Secondary | ICD-10-CM | POA: Diagnosis not present

## 2019-11-01 DIAGNOSIS — C7949 Secondary malignant neoplasm of other parts of nervous system: Secondary | ICD-10-CM | POA: Diagnosis not present

## 2019-11-01 DIAGNOSIS — M47812 Spondylosis without myelopathy or radiculopathy, cervical region: Secondary | ICD-10-CM | POA: Diagnosis not present

## 2019-11-02 ENCOUNTER — Encounter: Payer: Self-pay | Admitting: Family Medicine

## 2019-11-02 MED ORDER — MELATONIN 3 MG PO TBDP
3.00 | ORAL_TABLET | ORAL | Status: DC
Start: 2019-11-02 — End: 2019-11-02

## 2019-11-02 MED ORDER — OXYCODONE HCL 5 MG PO TABS
5.00 | ORAL_TABLET | ORAL | Status: DC
Start: ? — End: 2019-11-02

## 2019-11-02 MED ORDER — GLUCAGON (RDNA) 1 MG IJ KIT
1.00 | PACK | INTRAMUSCULAR | Status: DC
Start: ? — End: 2019-11-02

## 2019-11-02 MED ORDER — INSULIN LISPRO 100 UNIT/ML ~~LOC~~ SOLN
0.00 | SUBCUTANEOUS | Status: DC
Start: 2019-11-08 — End: 2019-11-02

## 2019-11-02 MED ORDER — DEXTROSE 50 % IV SOLN
12.50 | INTRAVENOUS | Status: DC
Start: ? — End: 2019-11-02

## 2019-11-02 MED ORDER — ACETAMINOPHEN 325 MG PO TABS
975.00 | ORAL_TABLET | ORAL | Status: DC
Start: 2019-11-13 — End: 2019-11-02

## 2019-11-02 MED ORDER — DEXAMETHASONE SODIUM PHOSPHATE 4 MG/ML IJ SOLN
4.00 | INTRAMUSCULAR | Status: DC
Start: 2019-11-07 — End: 2019-11-02

## 2019-11-02 MED ORDER — PANTOPRAZOLE SODIUM 40 MG PO TBEC
40.00 | DELAYED_RELEASE_TABLET | ORAL | Status: DC
Start: 2019-11-14 — End: 2019-11-02

## 2019-11-02 MED ORDER — LIDOCAINE HCL 1 % IJ SOLN
0.50 | INTRAMUSCULAR | Status: DC
Start: ? — End: 2019-11-02

## 2019-11-02 NOTE — Progress Notes (Unsigned)
Called patient , he is at [facility], he is thankful we sent him to [facility]. He is in good spirits. He is knowledgeable about his condition, waiting for pathology results so they can decide on therapy. Explained I can fill his wife's FMLA forms if needed

## 2019-11-07 HISTORY — PX: LAMINECTOMY FOR EXCISION / EVACUATION INTRASPINAL LESION: SUR741

## 2019-11-07 MED ORDER — ENOXAPARIN SODIUM 40 MG/0.4ML ~~LOC~~ SOLN
40.00 | SUBCUTANEOUS | Status: DC
Start: 2019-11-14 — End: 2019-11-07

## 2019-11-07 MED ORDER — ALBUTEROL SULFATE HFA 108 (90 BASE) MCG/ACT IN AERS
2.00 | INHALATION_SPRAY | RESPIRATORY_TRACT | Status: DC
Start: ? — End: 2019-11-07

## 2019-11-07 MED ORDER — SENNOSIDES-DOCUSATE SODIUM 8.6-50 MG PO TABS
2.00 | ORAL_TABLET | ORAL | Status: DC
Start: 2019-11-13 — End: 2019-11-07

## 2019-11-07 MED ORDER — FLUTICASONE-UMECLIDIN-VILANT 100-62.5-25 MCG/INH IN AEPB
1.00 | INHALATION_SPRAY | RESPIRATORY_TRACT | Status: DC
Start: 2019-11-14 — End: 2019-11-07

## 2019-11-07 MED ORDER — MELATONIN 3 MG PO TBDP
6.00 | ORAL_TABLET | ORAL | Status: DC
Start: 2019-11-07 — End: 2019-11-07

## 2019-11-08 MED ORDER — GENERIC EXTERNAL MEDICATION
Status: DC
Start: ? — End: 2019-11-08

## 2019-11-08 MED ORDER — SODIUM CHLORIDE 0.9 % IV SOLN
INTRAVENOUS | Status: DC
Start: ? — End: 2019-11-08

## 2019-11-08 MED ORDER — NALOXONE HCL 0.4 MG/ML IJ SOLN
0.40 | INTRAMUSCULAR | Status: DC
Start: ? — End: 2019-11-08

## 2019-11-13 MED ORDER — OXYCODONE HCL 5 MG PO TABS
5.00 | ORAL_TABLET | ORAL | Status: DC
Start: ? — End: 2019-11-13

## 2019-11-13 MED ORDER — INSULIN LISPRO 100 UNIT/ML ~~LOC~~ SOLN
0.00 | SUBCUTANEOUS | Status: DC
Start: 2019-11-13 — End: 2019-11-13

## 2019-11-13 MED ORDER — MAGNESIUM HYDROXIDE 400 MG/5ML PO SUSP
15.00 | ORAL | Status: DC
Start: ? — End: 2019-11-13

## 2019-11-13 MED ORDER — HYDROMORPHONE HCL 1 MG/ML IJ SOLN
0.50 | INTRAMUSCULAR | Status: DC
Start: ? — End: 2019-11-13

## 2019-11-13 MED ORDER — BISACODYL 10 MG RE SUPP
10.00 | RECTAL | Status: DC
Start: ? — End: 2019-11-13

## 2019-11-13 MED ORDER — POLYETHYLENE GLYCOL 3350 17 GM/SCOOP PO POWD
17.00 | ORAL | Status: DC
Start: 2019-11-14 — End: 2019-11-13

## 2019-11-14 ENCOUNTER — Other Ambulatory Visit: Payer: Medicare HMO

## 2019-11-14 DIAGNOSIS — N400 Enlarged prostate without lower urinary tract symptoms: Secondary | ICD-10-CM | POA: Diagnosis not present

## 2019-11-14 DIAGNOSIS — C7949 Secondary malignant neoplasm of other parts of nervous system: Secondary | ICD-10-CM | POA: Diagnosis not present

## 2019-11-14 DIAGNOSIS — C7951 Secondary malignant neoplasm of bone: Secondary | ICD-10-CM | POA: Diagnosis not present

## 2019-11-14 DIAGNOSIS — Z483 Aftercare following surgery for neoplasm: Secondary | ICD-10-CM | POA: Diagnosis not present

## 2019-11-14 DIAGNOSIS — C801 Malignant (primary) neoplasm, unspecified: Secondary | ICD-10-CM | POA: Diagnosis not present

## 2019-11-14 DIAGNOSIS — M47812 Spondylosis without myelopathy or radiculopathy, cervical region: Secondary | ICD-10-CM | POA: Diagnosis not present

## 2019-11-14 DIAGNOSIS — R69 Illness, unspecified: Secondary | ICD-10-CM | POA: Diagnosis not present

## 2019-11-14 DIAGNOSIS — M8458XD Pathological fracture in neoplastic disease, other specified site, subsequent encounter for fracture with routine healing: Secondary | ICD-10-CM | POA: Diagnosis not present

## 2019-11-14 DIAGNOSIS — M47816 Spondylosis without myelopathy or radiculopathy, lumbar region: Secondary | ICD-10-CM | POA: Diagnosis not present

## 2019-11-14 DIAGNOSIS — M4802 Spinal stenosis, cervical region: Secondary | ICD-10-CM | POA: Diagnosis not present

## 2019-11-14 DIAGNOSIS — J449 Chronic obstructive pulmonary disease, unspecified: Secondary | ICD-10-CM | POA: Diagnosis not present

## 2019-11-16 ENCOUNTER — Telehealth: Payer: Self-pay | Admitting: Family Medicine

## 2019-11-16 DIAGNOSIS — M8458XD Pathological fracture in neoplastic disease, other specified site, subsequent encounter for fracture with routine healing: Secondary | ICD-10-CM | POA: Diagnosis not present

## 2019-11-16 DIAGNOSIS — M47816 Spondylosis without myelopathy or radiculopathy, lumbar region: Secondary | ICD-10-CM | POA: Diagnosis not present

## 2019-11-16 DIAGNOSIS — M4802 Spinal stenosis, cervical region: Secondary | ICD-10-CM | POA: Diagnosis not present

## 2019-11-16 DIAGNOSIS — N400 Enlarged prostate without lower urinary tract symptoms: Secondary | ICD-10-CM | POA: Diagnosis not present

## 2019-11-16 DIAGNOSIS — J449 Chronic obstructive pulmonary disease, unspecified: Secondary | ICD-10-CM | POA: Diagnosis not present

## 2019-11-16 DIAGNOSIS — C801 Malignant (primary) neoplasm, unspecified: Secondary | ICD-10-CM | POA: Diagnosis not present

## 2019-11-16 DIAGNOSIS — C7951 Secondary malignant neoplasm of bone: Secondary | ICD-10-CM | POA: Diagnosis not present

## 2019-11-16 DIAGNOSIS — M47812 Spondylosis without myelopathy or radiculopathy, cervical region: Secondary | ICD-10-CM | POA: Diagnosis not present

## 2019-11-16 DIAGNOSIS — Z483 Aftercare following surgery for neoplasm: Secondary | ICD-10-CM | POA: Diagnosis not present

## 2019-11-16 DIAGNOSIS — R69 Illness, unspecified: Secondary | ICD-10-CM | POA: Diagnosis not present

## 2019-11-16 NOTE — Telephone Encounter (Signed)
Home Health Verbal Orders - Caller/AgencyBeryle Lathe Number: [phone] Requesting OT/PT/Skilled Nursing/Social Work/Speech Therapy: continuing OT Frequency: 1 w 5

## 2019-11-18 DIAGNOSIS — J449 Chronic obstructive pulmonary disease, unspecified: Secondary | ICD-10-CM | POA: Diagnosis not present

## 2019-11-18 DIAGNOSIS — M47816 Spondylosis without myelopathy or radiculopathy, lumbar region: Secondary | ICD-10-CM | POA: Diagnosis not present

## 2019-11-18 DIAGNOSIS — C801 Malignant (primary) neoplasm, unspecified: Secondary | ICD-10-CM | POA: Diagnosis not present

## 2019-11-18 DIAGNOSIS — C7951 Secondary malignant neoplasm of bone: Secondary | ICD-10-CM | POA: Diagnosis not present

## 2019-11-18 DIAGNOSIS — M8458XD Pathological fracture in neoplastic disease, other specified site, subsequent encounter for fracture with routine healing: Secondary | ICD-10-CM | POA: Diagnosis not present

## 2019-11-18 DIAGNOSIS — M47812 Spondylosis without myelopathy or radiculopathy, cervical region: Secondary | ICD-10-CM | POA: Diagnosis not present

## 2019-11-18 DIAGNOSIS — R69 Illness, unspecified: Secondary | ICD-10-CM | POA: Diagnosis not present

## 2019-11-18 DIAGNOSIS — N400 Enlarged prostate without lower urinary tract symptoms: Secondary | ICD-10-CM | POA: Diagnosis not present

## 2019-11-18 DIAGNOSIS — M4802 Spinal stenosis, cervical region: Secondary | ICD-10-CM | POA: Diagnosis not present

## 2019-11-18 DIAGNOSIS — Z483 Aftercare following surgery for neoplasm: Secondary | ICD-10-CM | POA: Diagnosis not present

## 2019-11-21 DIAGNOSIS — Z483 Aftercare following surgery for neoplasm: Secondary | ICD-10-CM | POA: Diagnosis not present

## 2019-11-21 DIAGNOSIS — C7951 Secondary malignant neoplasm of bone: Secondary | ICD-10-CM | POA: Diagnosis not present

## 2019-11-21 DIAGNOSIS — N400 Enlarged prostate without lower urinary tract symptoms: Secondary | ICD-10-CM | POA: Diagnosis not present

## 2019-11-21 DIAGNOSIS — M8458XD Pathological fracture in neoplastic disease, other specified site, subsequent encounter for fracture with routine healing: Secondary | ICD-10-CM | POA: Diagnosis not present

## 2019-11-21 DIAGNOSIS — R69 Illness, unspecified: Secondary | ICD-10-CM | POA: Diagnosis not present

## 2019-11-21 DIAGNOSIS — M4802 Spinal stenosis, cervical region: Secondary | ICD-10-CM | POA: Diagnosis not present

## 2019-11-21 DIAGNOSIS — C801 Malignant (primary) neoplasm, unspecified: Secondary | ICD-10-CM | POA: Diagnosis not present

## 2019-11-21 DIAGNOSIS — J449 Chronic obstructive pulmonary disease, unspecified: Secondary | ICD-10-CM | POA: Diagnosis not present

## 2019-11-21 DIAGNOSIS — M47812 Spondylosis without myelopathy or radiculopathy, cervical region: Secondary | ICD-10-CM | POA: Diagnosis not present

## 2019-11-21 DIAGNOSIS — C3491 Malignant neoplasm of unspecified part of right bronchus or lung: Secondary | ICD-10-CM | POA: Diagnosis not present

## 2019-11-21 DIAGNOSIS — M47816 Spondylosis without myelopathy or radiculopathy, lumbar region: Secondary | ICD-10-CM | POA: Diagnosis not present

## 2019-11-22 ENCOUNTER — Ambulatory Visit: Payer: Medicare HMO | Admitting: Family Medicine

## 2019-11-23 ENCOUNTER — Inpatient Hospital Stay: Payer: Medicare HMO | Attending: Internal Medicine | Admitting: Internal Medicine

## 2019-11-23 ENCOUNTER — Encounter (INDEPENDENT_AMBULATORY_CARE_PROVIDER_SITE_OTHER): Payer: Self-pay

## 2019-11-23 ENCOUNTER — Other Ambulatory Visit: Payer: Self-pay

## 2019-11-23 ENCOUNTER — Encounter: Payer: Self-pay | Admitting: Internal Medicine

## 2019-11-23 ENCOUNTER — Inpatient Hospital Stay: Payer: Medicare HMO

## 2019-11-23 DIAGNOSIS — C3411 Malignant neoplasm of upper lobe, right bronchus or lung: Secondary | ICD-10-CM | POA: Diagnosis not present

## 2019-11-23 DIAGNOSIS — M4802 Spinal stenosis, cervical region: Secondary | ICD-10-CM | POA: Diagnosis not present

## 2019-11-23 DIAGNOSIS — M2578 Osteophyte, vertebrae: Secondary | ICD-10-CM | POA: Insufficient documentation

## 2019-11-23 DIAGNOSIS — I700 Atherosclerosis of aorta: Secondary | ICD-10-CM | POA: Insufficient documentation

## 2019-11-23 DIAGNOSIS — Z8249 Family history of ischemic heart disease and other diseases of the circulatory system: Secondary | ICD-10-CM | POA: Diagnosis not present

## 2019-11-23 DIAGNOSIS — Z801 Family history of malignant neoplasm of trachea, bronchus and lung: Secondary | ICD-10-CM | POA: Diagnosis not present

## 2019-11-23 DIAGNOSIS — R531 Weakness: Secondary | ICD-10-CM | POA: Insufficient documentation

## 2019-11-23 DIAGNOSIS — G9520 Unspecified cord compression: Secondary | ICD-10-CM | POA: Diagnosis not present

## 2019-11-23 DIAGNOSIS — R0602 Shortness of breath: Secondary | ICD-10-CM | POA: Diagnosis not present

## 2019-11-23 DIAGNOSIS — C787 Secondary malignant neoplasm of liver and intrahepatic bile duct: Secondary | ICD-10-CM

## 2019-11-23 DIAGNOSIS — M50221 Other cervical disc displacement at C4-C5 level: Secondary | ICD-10-CM | POA: Insufficient documentation

## 2019-11-23 DIAGNOSIS — R200 Anesthesia of skin: Secondary | ICD-10-CM | POA: Insufficient documentation

## 2019-11-23 DIAGNOSIS — M549 Dorsalgia, unspecified: Secondary | ICD-10-CM | POA: Insufficient documentation

## 2019-11-23 DIAGNOSIS — R5383 Other fatigue: Secondary | ICD-10-CM | POA: Diagnosis not present

## 2019-11-23 DIAGNOSIS — R600 Localized edema: Secondary | ICD-10-CM | POA: Insufficient documentation

## 2019-11-23 DIAGNOSIS — J439 Emphysema, unspecified: Secondary | ICD-10-CM

## 2019-11-23 DIAGNOSIS — C7951 Secondary malignant neoplasm of bone: Secondary | ICD-10-CM | POA: Diagnosis not present

## 2019-11-23 DIAGNOSIS — Z79899 Other long term (current) drug therapy: Secondary | ICD-10-CM | POA: Insufficient documentation

## 2019-11-23 DIAGNOSIS — Z87891 Personal history of nicotine dependence: Secondary | ICD-10-CM | POA: Insufficient documentation

## 2019-11-23 DIAGNOSIS — M5136 Other intervertebral disc degeneration, lumbar region: Secondary | ICD-10-CM | POA: Diagnosis not present

## 2019-11-23 DIAGNOSIS — E46 Unspecified protein-calorie malnutrition: Secondary | ICD-10-CM | POA: Diagnosis not present

## 2019-11-23 DIAGNOSIS — R64 Cachexia: Secondary | ICD-10-CM | POA: Diagnosis not present

## 2019-11-23 DIAGNOSIS — N400 Enlarged prostate without lower urinary tract symptoms: Secondary | ICD-10-CM | POA: Insufficient documentation

## 2019-11-23 DIAGNOSIS — Z7289 Other problems related to lifestyle: Secondary | ICD-10-CM | POA: Insufficient documentation

## 2019-11-23 LAB — LACTATE DEHYDROGENASE: LDH: 422 U/L — ABNORMAL HIGH (ref 98–192)

## 2019-11-23 LAB — CBC WITH DIFFERENTIAL/PLATELET
Abs Immature Granulocytes: 0.09 10*3/uL — ABNORMAL HIGH (ref 0.00–0.07)
Basophils Absolute: 0 10*3/uL (ref 0.0–0.1)
Basophils Relative: 0 %
Eosinophils Absolute: 0.1 10*3/uL (ref 0.0–0.5)
Eosinophils Relative: 1 %
HCT: 33.8 % — ABNORMAL LOW (ref 39.0–52.0)
Hemoglobin: 11 g/dL — ABNORMAL LOW (ref 13.0–17.0)
Immature Granulocytes: 1 %
Lymphocytes Relative: 13 %
Lymphs Abs: 1 10*3/uL (ref 0.7–4.0)
MCH: 30 pg (ref 26.0–34.0)
MCHC: 32.5 g/dL (ref 30.0–36.0)
MCV: 92.1 fL (ref 80.0–100.0)
Monocytes Absolute: 0.7 10*3/uL (ref 0.1–1.0)
Monocytes Relative: 10 %
Neutro Abs: 5.2 10*3/uL (ref 1.7–7.7)
Neutrophils Relative %: 75 %
Platelets: 336 10*3/uL (ref 150–400)
RBC: 3.67 MIL/uL — ABNORMAL LOW (ref 4.22–5.81)
RDW: 16.6 % — ABNORMAL HIGH (ref 11.5–15.5)
WBC: 7.1 10*3/uL (ref 4.0–10.5)
nRBC: 0 % (ref 0.0–0.2)

## 2019-11-23 LAB — COMPREHENSIVE METABOLIC PANEL
ALT: 39 U/L (ref 0–44)
AST: 33 U/L (ref 15–41)
Albumin: 2.8 g/dL — ABNORMAL LOW (ref 3.5–5.0)
Alkaline Phosphatase: 244 U/L — ABNORMAL HIGH (ref 38–126)
Anion gap: 9 (ref 5–15)
BUN: 22 mg/dL (ref 8–23)
CO2: 26 mmol/L (ref 22–32)
Calcium: 8.2 mg/dL — ABNORMAL LOW (ref 8.9–10.3)
Chloride: 101 mmol/L (ref 98–111)
Creatinine, Ser: 0.59 mg/dL — ABNORMAL LOW (ref 0.61–1.24)
GFR calc Af Amer: >60 mL/min (ref >60)
GFR calc non Af Amer: >60 mL/min (ref >60)
Glucose, Bld: 141 mg/dL — ABNORMAL HIGH (ref 70–99)
Potassium: 4.2 mmol/L (ref 3.5–5.1)
Sodium: 136 mmol/L (ref 135–145)
Total Bilirubin: 0.5 mg/dL (ref 0.3–1.2)
Total Protein: 6.4 g/dL — ABNORMAL LOW (ref 6.5–8.1)

## 2019-11-23 NOTE — Progress Notes (Incomplete)
11/24/19 10:52 PM   Cody [name] 01/02/1956 [id number]  Referring provider: Steele Sizer, MD [address] [place],  [place] [address] No chief complaint on file.   HPI: Cody [name] is a 64 y.o. M who presents today for the evaluation and management of elevated PSA/BPH.   Elevated PSA He had a prostate biopsy on 12/14/14 which was negative for malignancy. His PSA at that time was 5.1. TRUS vol 68 g.   Most recent PSA 8.2 as of 10/21/19. PSA trend below.   Most recent CT A/P w contrast from 10/31/19 revealed an enlarged prostate gland, otherwise negative for GU pathology.   Component     Latest Ref Rng & Units 12/28/2015 07/01/2016 10/21/2019  Prostate Specific Ag, Serum     0.0 - 4.0 ng/mL 5.8 (H) 6.6 (H) 8.2 (H)      PMH: Past Medical History:  Diagnosis Date  . Allergy   . COPD (chronic obstructive pulmonary disease) ([place])   . Prostate enlargement     Surgical History: Past Surgical History:  Procedure Laterality Date  . HERNIA REPAIR Bilateral 1982  . HERNIA REPAIR Right 1994    Home Medications:  Allergies as of 11/24/2019   No Known Allergies     Medication List       Accurate as of November 23, 2019 10:52 PM. If you have any questions, ask your nurse or doctor.        acetaminophen 500 MG tablet Commonly known as: TYLENOL Take 500 mg by mouth every 6 (six) hours as needed.       Allergies: No Known Allergies  Family History: Family History  Problem Relation Age of Onset  . Heart disease Father   . CVA Mother   . Lung cancer Sister   . Lung cancer Brother     Social History:  reports that he quit smoking about 8 years ago. He has a 60.00 pack-year smoking history. He has never used smokeless tobacco. He reports current alcohol use of about 5.0 standard drinks of alcohol per week. He reports that he does not use drugs.   Physical Exam: There were no vitals taken for this visit.  Constitutional:  Alert and oriented, No acute  distress. HEENT: [place] AT, moist mucus membranes.  Trachea midline, no masses. Cardiovascular: No clubbing, cyanosis, or edema. Respiratory: Normal respiratory effort, no increased work of breathing. GI: Abdomen is soft, nontender, nondistended, no abdominal masses GU: No CVA tenderness Lymph: No cervical or inguinal lymphadenopathy. Skin: No rashes, bruises or suspicious lesions. Neurologic: Grossly intact, no focal deficits, moving all 4 extremities. Psychiatric: Normal mood and affect.  Laboratory Data:  Lab Results  Component Value Date   CREATININE 0.59 (L) 11/23/2019   Lab Results  Component Value Date   HGBA1C 6.1 (H) 10/21/2019    Urinalysis   Pertinent Imaging: CLINICAL DATA:  History of trauma with subsequent upper back pain.  EXAM: CT CHEST, ABDOMEN, AND PELVIS WITH CONTRAST  TECHNIQUE: Multidetector CT imaging of the chest, abdomen and pelvis was performed following the standard protocol during bolus administration of intravenous contrast.  CONTRAST:  69mL OMNIPAQUE IOHEXOL 300 MG/ML  SOLN  COMPARISON:  None.  FINDINGS: CT CHEST FINDINGS  Cardiovascular: There is moderate severity calcification of the aortic arch. Normal heart size. No pericardial effusion. Moderate to marked severity coronary artery calcification is seen.  Mediastinum/Nodes: No enlarged mediastinal, hilar, or axillary lymph nodes. Thyroid gland, trachea, and esophagus demonstrate no significant findings.  Lungs/Pleura: There is marked severity emphysematous lung disease.  There is stable moderate to marked severity right apical scarring and/or atelectasis.  A predominant stable 2.2 cm x 1.7 cm irregular appearing area of low attenuation is seen within the anterior aspect of the right upper lobe (axial CT image 38, CT series number 4). A stable, similar appearing 1.4 cm x 0.9 cm area is noted within the posterior aspect of the right upper lobe (axial CT image 30, CT  series number 4).  A new 5.6 cm x 3.1 cm heterogeneous low-attenuation soft tissue mass is seen within the posterior aspect of the right upper lobe. This extends into the adjacent pleura and chest wall with subsequent cortical destruction of the posterior aspect of the fourth right rib.  A 4.8 cm x 5.2 cm heterogeneous low-attenuation soft tissue mass is seen along the posteromedial aspect of the right upper lobe with extension to involve the adjacent pleura and chest wall. Cortical destruction of the posteromedial aspect of the third right rib is seen with cortical destruction of the T3 and T4 vertebral bodies also noted. This soft tissue mass extends into the spinal canal with subsequent marked severity spinal canal stenosis and spinal cord impingement.  Musculoskeletal: Areas of cortical destruction, as described above, involving the posteromedial aspect of the third right rib, posterior aspect of the fourth right rib, T3 vertebral body and T4 vertebral body. A pathologic compression fracture deformity of the T3 vertebral body is also seen with mild posterior retropulsion of fracture fragments.  CT ABDOMEN PELVIS FINDINGS  Hepatobiliary: Multiple heterogeneous low-attenuation liver lesions of various sizes are seen scattered through the right and left lobes. No gallstones, gallbladder wall thickening, or biliary dilatation.  Pancreas: Unremarkable. No pancreatic ductal dilatation or surrounding inflammatory changes.  Spleen: Normal in size without focal abnormality.  Adrenals/Urinary Tract: Adrenal glands are unremarkable. Kidneys are normal in size, without renal calculi or hydronephrosis. Focal cortical scarring is seen along the posterior aspect of the mid left kidney. Bladder is unremarkable.  Stomach/Bowel: Stomach is within normal limits. The appendix is not clearly identified. No evidence of bowel wall thickening, distention, or inflammatory  changes.  Vascular/Lymphatic: Marked severity aortic calcification. No enlarged abdominal or pelvic lymph nodes.  Reproductive: The prostate gland is moderately enlarged  Other: Multiple surgical clips are seen along the right inguinal region.  Musculoskeletal: Multilevel degenerative changes seen throughout the lumbar spine.  IMPRESSION: 1. 1. Heterogeneous low-attenuation soft tissue masses within the posterior and posteromedial aspects of the right upper lobe, with extension to the adjacent pleura and chest wall, and associated cortical destruction of the posterior aspect of the third right rib, posterior fourth right rib, T3 and T4 vertebral bodies. Extension into the spinal canal with subsequent marked severity narrowing and spinal cord impingement is seen. MRI correlation is recommended. 2. Pathologic compression fracture deformity of the T3 vertebral body which likely represents sequelae from the patient's recent upper back trauma. 3. Stable, irregular appearing areas of low attenuation within the anterior and posterior aspects of the right upper lobe, which may represent areas of scarring, however malignancy cannot be excluded. 4. Multiple heterogeneous low-attenuation liver lesions of various sizes, consistent with metastatic disease. 5. Stable moderate to marked severity right apical scarring and/or atelectasis. 6. Advanced coronary artery calcification. 7. Moderate to marked severity aortic calcification. 8. Enlarged prostate gland. 9. Degenerative changes of the lumbar spine.  Aortic Atherosclerosis (ICD10-I70.0) and Emphysema (ICD10-J43.9).   Electronically Signed   By: Joyce Gross.D.  On: 10/31/2019 21:17  I have personally reviewed the images and agree with radiologist interpretation.   Assessment & Plan:     No follow-ups on file.  [facility] [address] [place], [place] [address] [phone]  I, Lucas Mallow, am acting as a scribe for Dr. Hollice Espy,  {Add Scribe Attestation Statement}

## 2019-11-23 NOTE — Assessment & Plan Note (Addendum)
#  Metastatic non-small cell lung cancer favor adeno; mets to bone.  Discussed stage and pathology with the patient/and brother.  #Discussed treatment options of palliative; not curative.  Patient would benefit from systemic chemotherapy-carbo Alimta Keytruda after finishing radiation.  Will discuss further at next visit.  #Bone metastases; spinal cord compression status post decompressive surgery and fusion.  -Patient would benefit from Zometa; discuss at next visit.  Will recommend radiation ASAP.  #Malnutrition/cachexia-discussed regarding protein supplementation; will make a dietary referral.  #Spinal cord compression with residual weakness-continuing physical therapy.   # COPD-stable on Trelegy.  Thank you, for allowing me to participate in the care of your pleasant patient. Please do not hesitate to contact me with questions or concerns in the interim.  # DISPOSITION: release # labs today- cbc/cmp/cea # nutrition # palliative care in 2 weeks # Radiation referral Dr.Crystal ASAP- lung cancer/spine mets # follow up in 2 weeks; MD; No labs- Dr.B  Addendum: We will also recommend a PET scan for further evaluation.

## 2019-11-23 NOTE — Progress Notes (Signed)
Pt and brother in for new patient visit, referred by [facility] for thoracic spine metastatic lesion.

## 2019-11-23 NOTE — Progress Notes (Signed)
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   # NGS/MOLECULAR TESTS:   # PALLIATIVE CARE EVALUATION:  # PAIN MANAGEMENT:    DIAGNOSIS:   STAGE:         ;  GOALS:  CURRENT/MOST RECENT THERAPY :     Cancer of upper lobe of right lung ([place])  11/23/2019 Initial Diagnosis   Cancer of upper lobe of right lung ([place])      HISTORY OF PRESENTING ILLNESS:  Cody [name] 64 y.o.  male history of smoking is here for further evaluation and recommendations for lung cancer.  Patient noted to have worsening pain in his back in the last few months.  However at work he noticed sudden worsening of his pain.  He also noted to have weakness and numbness of his bilateral extremities.  This led to urgent evaluation at [facility] as summarized above.  Patient was emergently transferred to [facility]/and underwent T1-T6 laminectomy with posterior fusion.  Patient is currently home undergoing physical therapy.   Patient states that he continues to have weakness/numbness down his legs.  He is able to go around with a walker.  At baseline patient prior to illness-patient is working full-time at a [employer].  Review of Systems  Constitutional: Positive for malaise/fatigue and weight loss. Negative for chills, diaphoresis and fever.  HENT: Negative for nosebleeds and sore throat.   Eyes: Negative for double  vision.  Respiratory: Positive for shortness of breath. Negative for cough, hemoptysis, sputum production and wheezing.   Cardiovascular: Negative for chest pain, palpitations, orthopnea and leg swelling.  Gastrointestinal: Negative for abdominal pain, blood in stool, constipation, diarrhea, heartburn, melena, nausea and vomiting.  Genitourinary: Negative for dysuria, frequency and urgency.  Musculoskeletal: Positive for back pain and joint pain.  Skin: Negative.  Negative for itching and rash.  Neurological: Negative for dizziness, tingling, focal weakness, weakness and headaches.  Endo/Heme/Allergies: Does not bruise/bleed easily.  Psychiatric/Behavioral: Negative for depression. The patient is not nervous/anxious and does not have insomnia.      MEDICAL HISTORY:  Past Medical History:  Diagnosis Date  . Allergy   . COPD (chronic obstructive pulmonary disease) ([place])   . Prostate enlargement     SURGICAL HISTORY: Past Surgical History:  Procedure Laterality Date  . HERNIA REPAIR Bilateral 1982  . HERNIA REPAIR Right 1994  . LAMINECTOMY FOR EXCISION / EVACUATION INTRASPINAL LESION  11/07/2019   T1-T 6 at [facility]     SOCIAL HISTORY: Social History   Socioeconomic History  . Marital status: Married    Spouse name: [name]   . Number of children: 4  . Years of education: Not on file  . Highest education level: Some college, no degree  Occupational History  . Occupation: dispatch and delivery   Tobacco Use  . Smoking status: Former Smoker    Packs/day: 2.00    Years: 30.00    Pack years: 60.00    Quit date: 07/29/2011    Years since  quitting: 8.3  . Smokeless tobacco: Never Used  . Tobacco comment: pt vapes  Substance and Sexual Activity  . Alcohol use: Yes    Alcohol/week: 5.0 standard drinks    Types: 5 Standard drinks or equivalent per week    Comment: 1-2/day  . Drug use: No  . Sexual activity: Not Currently    Partners: Female  Other Topics Concern  . Not on file   Social History Narrative   Worked in warehouse/ quit smoking in 2015; beer 1/day; in [place]; with wife.    Social Determinants of Health   Financial Resource Strain: Low Risk   . Difficulty of Paying Living Expenses: Not hard at all  Food Insecurity: No Food Insecurity  . Worried About Charity fundraiser in the Last Year: Never true  . Ran Out of Food in the Last Year: Never true  Transportation Needs: No Transportation Needs  . Lack of Transportation (Medical): No  . Lack of Transportation (Non-Medical): No  Physical Activity: Insufficiently Active  . Days of Exercise per Week: 7 days  . Minutes of Exercise per Session: 20 min  Stress: No Stress Concern Present  . Feeling of Stress : Not at all  Social Connections: Not Isolated  . Frequency of Communication with Friends and Family: More than three times a week  . Frequency of Social Gatherings with Friends and Family: More than three times a week  . Attends Religious Services: More than 4 times per year  . Active Member of Clubs or Organizations: Yes  . Attends Archivist Meetings: More than 4 times per year  . Marital Status: Married  Human resources officer Violence: Not At Risk  . Fear of Current or Ex-Partner: No  . Emotionally Abused: No  . Physically Abused: No  . Sexually Abused: No    FAMILY HISTORY: Family History  Problem Relation Age of Onset  . Heart disease Father   . CVA Mother   . Lung cancer Sister   . Lung cancer Brother     ALLERGIES:  has No Known Allergies.  MEDICATIONS:  Current Outpatient Medications  Medication Sig Dispense Refill  . acetaminophen (TYLENOL) 500 MG tablet Take 500 mg by mouth every 6 (six) hours as needed.    . Fluticasone-Umeclidin-Vilant 100-62.5-25 MCG/INH AEPB Inhale into the lungs.    . Multiple Vitamins-Minerals (MULTIVITAMIN GUMMIES MENS PO) Take by mouth.     No current facility-administered medications for this visit.      Marland Kitchen  PHYSICAL EXAMINATION: ECOG  PERFORMANCE STATUS: 3 - Symptomatic, >50% confined to bed  Vitals:   11/23/19 1136  BP: 105/67  Pulse: 92  Resp: 18  Temp: (!) 96.4 F (35.8 C)  SpO2: 96%   Filed Weights   11/23/19 1136  Weight: 115 lb 1.6 oz (52.2 kg)    Physical Exam  Constitutional: He is oriented to person, place, and time.  Thin built frail appearing male patient.  No acute distress.  HEENT negative.  Accompanied by his brother.  HENT:  Head: Normocephalic and atraumatic.  Mouth/Throat: Oropharynx is clear and moist. No oropharyngeal exudate.  Eyes: Pupils are equal, round, and reactive to light.  Cardiovascular: Normal rate and regular rhythm.  Pulmonary/Chest: No respiratory distress. He has no wheezes.  Decreased air entry bilaterally no wheeze or crackles.  Abdominal: Soft. Bowel sounds are normal. He exhibits no distension and no mass. There is no abdominal tenderness. There is no rebound and no guarding.  Musculoskeletal:  General: No tenderness or edema. Normal range of motion.     Cervical back: Normal range of motion and neck supple.  Neurological: He is alert and oriented to person, place, and time.  Skin: Skin is warm.  Psychiatric: Affect normal.     LABORATORY DATA:  I have reviewed the data as listed Lab Results  Component Value Date   WBC 7.1 11/23/2019   HGB 11.0 (L) 11/23/2019   HCT 33.8 (L) 11/23/2019   MCV 92.1 11/23/2019   PLT 336 11/23/2019   Recent Labs    10/21/19 1517 10/31/19 1317 11/23/19 1222  NA 135 132* 136  K 4.7 4.2 4.2  CL 95* 100 101  CO2 28 23 26   GLUCOSE 94 155* 141*  BUN 14 14 22   CREATININE 0.62* 0.58* 0.59*  CALCIUM 9.2 8.8* 8.2*  GFRNONAA 104 >60 >60  GFRAA 120 >60 >60  PROT 7.0  --  6.4*  ALBUMIN 3.6*  --  2.8*  AST 29  --  33  ALT 23  --  39  ALKPHOS 272*  --  244*  BILITOT 0.5  --  0.5    RADIOGRAPHIC STUDIES: I have personally reviewed the radiological images as listed and agreed with the findings in the report. CT Chest W  Contrast  Result Date: 10/31/2019 CLINICAL DATA:  History of trauma with subsequent upper back pain. EXAM: CT CHEST, ABDOMEN, AND PELVIS WITH CONTRAST TECHNIQUE: Multidetector CT imaging of the chest, abdomen and pelvis was performed following the standard protocol during bolus administration of intravenous contrast. CONTRAST:  11m OMNIPAQUE IOHEXOL 300 MG/ML  SOLN COMPARISON:  May 26, 2017 FINDINGS: CT CHEST FINDINGS Cardiovascular: There is moderate severity calcification of the aortic arch. Normal heart size. No pericardial effusion. Moderate to marked severity coronary artery calcification is seen. Mediastinum/Nodes: No enlarged mediastinal, hilar, or axillary lymph nodes. Thyroid gland, trachea, and esophagus demonstrate no significant findings. Lungs/Pleura: There is marked severity emphysematous lung disease. There is stable moderate to marked severity right apical scarring and/or atelectasis. A predominant stable 2.2 cm x 1.7 cm irregular appearing area of low attenuation is seen within the anterior aspect of the right upper lobe (axial CT image 38, CT series number 4). A stable, similar appearing 1.4 cm x 0.9 cm area is noted within the posterior aspect of the right upper lobe (axial CT image 30, CT series number 4). A new 5.6 cm x 3.1 cm heterogeneous low-attenuation soft tissue mass is seen within the posterior aspect of the right upper lobe. This extends into the adjacent pleura and chest wall with subsequent cortical destruction of the posterior aspect of the fourth right rib. A 4.8 cm x 5.2 cm heterogeneous low-attenuation soft tissue mass is seen along the posteromedial aspect of the right upper lobe with extension to involve the adjacent pleura and chest wall. Cortical destruction of the posteromedial aspect of the third right rib is seen with cortical destruction of the T3 and T4 vertebral bodies also noted. This soft tissue mass extends into the spinal canal with subsequent marked severity  spinal canal stenosis and spinal cord impingement. Musculoskeletal: Areas of cortical destruction, as described above, involving the posteromedial aspect of the third right rib, posterior aspect of the fourth right rib, T3 vertebral body and T4 vertebral body. A pathologic compression fracture deformity of the T3 vertebral body is also seen with mild posterior retropulsion of fracture fragments. CT ABDOMEN PELVIS FINDINGS Hepatobiliary: Multiple heterogeneous low-attenuation liver lesions of various sizes are seen scattered  through the right and left lobes. No gallstones, gallbladder wall thickening, or biliary dilatation. Pancreas: Unremarkable. No pancreatic ductal dilatation or surrounding inflammatory changes. Spleen: Normal in size without focal abnormality. Adrenals/Urinary Tract: Adrenal glands are unremarkable. Kidneys are normal in size, without renal calculi or hydronephrosis. Focal cortical scarring is seen along the posterior aspect of the mid left kidney. Bladder is unremarkable. Stomach/Bowel: Stomach is within normal limits. The appendix is not clearly identified. No evidence of bowel wall thickening, distention, or inflammatory changes. Vascular/Lymphatic: Marked severity aortic calcification. No enlarged abdominal or pelvic lymph nodes. Reproductive: The prostate gland is moderately enlarged Other: Multiple surgical clips are seen along the right inguinal region. Musculoskeletal: Multilevel degenerative changes seen throughout the lumbar spine. IMPRESSION: 1. Heterogeneous low-attenuation soft tissue masses within the posterior and posteromedial aspects of the right upper lobe, with extension to the adjacent pleura and chest wall, and associated cortical destruction of the posterior aspect of the third right rib, posterior fourth right rib, T3 and T4 vertebral bodies. Extension into the spinal canal with subsequent marked severity narrowing and spinal cord impingement is seen. MRI correlation is  recommended. 2. Pathologic compression fracture deformity of the T3 vertebral body which likely represents sequelae from the patient's recent upper back trauma. 3. Stable, irregular appearing areas of low attenuation within the anterior and posterior aspects of the right upper lobe, which may represent areas of scarring, however malignancy cannot be excluded. 4. Multiple heterogeneous low-attenuation liver lesions of various sizes, consistent with metastatic disease. 5. Stable moderate to marked severity right apical scarring and/or atelectasis. 6. Advanced coronary artery calcification. 7. Moderate to marked severity aortic calcification. 8. Enlarged prostate gland. 9. Degenerative changes of the lumbar spine. Aortic Atherosclerosis (ICD10-I70.0) and Emphysema (ICD10-J43.9). Electronically Signed   By: Virgina Norfolk M.D.   On: 10/31/2019 21:11   MR Cervical Spine Wo Contrast  Result Date: 10/31/2019 CLINICAL DATA:  64 year old male with recent upper back pain thought related to work injury. Increasing numbness in both legs since last week. EXAM: MRI CERVICAL SPINE WITHOUT CONTRAST TECHNIQUE: Multiplanar, multisequence MR imaging of the cervical spine was performed. No intravenous contrast was administered. COMPARISON:  Thoracic spine MRI today reported separately. FINDINGS: Alignment: Relatively preserved cervical lordosis. No spondylolisthesis. Vertebrae: No convincing cervical vertebral marrow edema or marrow lesion. However, there is a large right apical lung and/or upper thoracic paravertebral mass partially visible (series 19 image 7) with abnormal marrow signal in the T1 and T2 levels. See comparison. Cord: Normal cervical spinal cord. See thoracic spinal cord details reported separately. Posterior Fossa, vertebral arteries, paraspinal tissues: Partially visible large right thoracic inlet soft tissue mass. See thoracic spine MRI. Preserved major vascular flow voids in the neck, the left vertebral  artery appears dominant. No cervical lymphadenopathy is evident. Cervicomedullary junction is within normal limits. Grossly negative visible brain parenchyma. Disc levels: Cervical spine degeneration most notable for: C4-C5: Small central disc protrusion with moderate facet hypertrophy and endplate spurring greater on the right. Stenosis there is moderate right C5 foraminal. C5-C6: Disc bulge and endplate spurring with mild to moderate facet hypertrophy. Mild ligament flavum hypertrophy. No significant spinal stenosis. Moderate to severe left and mild-to-moderate right C6 foraminal stenosis. C6-C7: Circumferential disc osteophyte complex. Broad-based posterior component with mild to moderate facet and ligament flavum hypertrophy. Borderline to mild spinal stenosis. Severe left greater than right C7 foraminal stenosis. IMPRESSION: 1. Partially visible large right thoracic inlet and/or paravertebral soft tissue tumor. 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 See Thoracic Spine MRI reported separately. 2. No involvement of the cervical spine or spinal cord. 3. There is lower cervical spine degeneration resulting in up to mild spinal stenosis, and moderate/severe foraminal stenosis at the right C5, left C6 and bilateral C7 nerve levels. Electronically Signed   By: Genevie Ann M.D.   On: 10/31/2019 18:49   MR THORACIC SPINE WO CONTRAST  Result Date: 10/31/2019 CLINICAL DATA:  64 year old male with recent upper back pain thought related to work injury. Increasing numbness in both legs since last week. EXAM: MRI THORACIC SPINE WITHOUT CONTRAST TECHNIQUE: Multiplanar, multisequence MR imaging of the thoracic spine was performed. No intravenous contrast was administered. COMPARISON:  Cervical spine MRI today reported separately. Chest CT 05/26/2017. FINDINGS: Alignment: Normal cervicothoracic junction alignment. Mildly exaggerated upper thoracic kyphosis in conjunction with the pathologic findings below. Subtle  anterolisthesis of T2 on T3. Vertebrae: Pathologic compression fracture of T3 with vertebra plana. Abnormal marrow signal indicative of vertebral tumor and/or marrow edema also throughout the T2, T4, and right T5 vertebrae. Involvement of the posterior ribs at those levels, and additional bulky tumor (up to nearly 3 cm diameter) tracking along the skin right posterolateral 4th rib (series 20, image 13). Tumor invading and destroying the upper thoracic spine epicenter at T3 encompasses up to 7.4 cm diameter. See series 20, image 9 and series 16, image 5. Epidural invasion, foraminal invasion, and cord compression at that level (see below). The right T2 through T4 neural foramina are obliterated. There is also tumor in the left T3 foramen. The T1 vertebra is relatively spared at this time although there is tumor in the T1-T2 interspinous space. The T6 through T12 vertebrae appear spared. Cord: Tumor throughout the spinal canal from the T2 to the T4 level with cord compression maximal at T3 (series 20, image 9) where the cord is compressed to about 3-4 mm AP. There is subtle associated abnormal cord signal. Below the mid T4 level thoracic cord signal and morphology is normal. Normal conus at T12-L1. Paraspinal and other soft tissues: Bulky right paraspinal tumor in the upper thoracic spine and posterior right 4th rib space as detailed above. There is superimposed chronic right apical lung disease, and there might be additional tumor in the anterior right lung periphery as seen on series 20, image 6. Disc levels: Malignant findings in the upper thoracic spine as stated above. No superimposed age advanced thoracic spine degeneration. IMPRESSION: 1. T3 level right paraspinal bulky and destructive soft tissue tumor, 7.4 cm diameter. T3 pathologic fracture with vertebra plana. Widespread epidural tumor extension with SPINAL CORD COMPRESSION and mild cord edema maximal at T3. Tumor obliterating the bilateral T3 and right T2  and T4 neural foramina. Bulky tumor also growing laterally along the right 4th rib. 2. Top differential considerations are Lung Cancer and Plasmacytoma. Lymphoma is also considered but felt less likely. Critical Value/emergent results were called by telephone at the time of interpretation on 10/31/2019 at 6:58 pm to Dr. Delman Kitten , who verbally acknowledged these results. Electronically Signed   By: Genevie Ann M.D.   On: 10/31/2019 19:03   MR LUMBAR SPINE WO CONTRAST  Result Date: 10/31/2019 CLINICAL DATA:  64 year old male with recent upper back pain thought related to work injury. Increasing numbness in both legs since last week. EXAM: MRI LUMBAR SPINE WITHOUT CONTRAST TECHNIQUE: Multiplanar, multisequence MR imaging of the lumbar spine was performed. No intravenous contrast was administered. COMPARISON:  Thoracic spine MRI today reported separately. FINDINGS:  Segmentation:  Lumbar segmentation appears to be normal. Alignment:  Relatively preserved lumbar lordosis. Vertebrae: Visualized bone marrow signal is within normal limits. No lumbar or visible sacral marrow edema or suspicious marrow lesion. Conus medullaris and cauda equina: Conus extends to the T12-L1 level. No lower spinal cord or conus signal abnormality. Normal noncontrast appearance of the cauda equina nerve roots. Paraspinal and other soft tissues: Visualized noncontrast abdominal viscera and paraspinal soft tissues are within normal limits. Disc levels: No significant lumbar spinal stenosis. There is chronic disc and endplate degeneration E4-V4 through L4-L5 with some superimposed lumbar facet hypertrophy. IMPRESSION: 1. No acute or metastatic process identified in the lumbar spine. See Thoracic Spine MRI today reported separately. 2. Lumbar spine degeneration without significant spinal stenosis. Electronically Signed   By: Genevie Ann M.D.   On: 10/31/2019 19:13   CT ABDOMEN PELVIS W CONTRAST  Result Date: 10/31/2019 CLINICAL DATA:  History of trauma  with subsequent upper back pain. EXAM: CT CHEST, ABDOMEN, AND PELVIS WITH CONTRAST TECHNIQUE: Multidetector CT imaging of the chest, abdomen and pelvis was performed following the standard protocol during bolus administration of intravenous contrast. CONTRAST:  38m OMNIPAQUE IOHEXOL 300 MG/ML  SOLN COMPARISON:  None. FINDINGS: CT CHEST FINDINGS Cardiovascular: There is moderate severity calcification of the aortic arch. Normal heart size. No pericardial effusion. Moderate to marked severity coronary artery calcification is seen. Mediastinum/Nodes: No enlarged mediastinal, hilar, or axillary lymph nodes. Thyroid gland, trachea, and esophagus demonstrate no significant findings. Lungs/Pleura: There is marked severity emphysematous lung disease. There is stable moderate to marked severity right apical scarring and/or atelectasis. A predominant stable 2.2 cm x 1.7 cm irregular appearing area of low attenuation is seen within the anterior aspect of the right upper lobe (axial CT image 38, CT series number 4). A stable, similar appearing 1.4 cm x 0.9 cm area is noted within the posterior aspect of the right upper lobe (axial CT image 30, CT series number 4). A new 5.6 cm x 3.1 cm heterogeneous low-attenuation soft tissue mass is seen within the posterior aspect of the right upper lobe. This extends into the adjacent pleura and chest wall with subsequent cortical destruction of the posterior aspect of the fourth right rib. A 4.8 cm x 5.2 cm heterogeneous low-attenuation soft tissue mass is seen along the posteromedial aspect of the right upper lobe with extension to involve the adjacent pleura and chest wall. Cortical destruction of the posteromedial aspect of the third right rib is seen with cortical destruction of the T3 and T4 vertebral bodies also noted. This soft tissue mass extends into the spinal canal with subsequent marked severity spinal canal stenosis and spinal cord impingement. Musculoskeletal: Areas of  cortical destruction, as described above, involving the posteromedial aspect of the third right rib, posterior aspect of the fourth right rib, T3 vertebral body and T4 vertebral body. A pathologic compression fracture deformity of the T3 vertebral body is also seen with mild posterior retropulsion of fracture fragments. CT ABDOMEN PELVIS FINDINGS Hepatobiliary: Multiple heterogeneous low-attenuation liver lesions of various sizes are seen scattered through the right and left lobes. No gallstones, gallbladder wall thickening, or biliary dilatation. Pancreas: Unremarkable. No pancreatic ductal dilatation or surrounding inflammatory changes. Spleen: Normal in size without focal abnormality. Adrenals/Urinary Tract: Adrenal glands are unremarkable. Kidneys are normal in size, without renal calculi or hydronephrosis. Focal cortical scarring is seen along the posterior aspect of the mid left kidney. Bladder is unremarkable. Stomach/Bowel: Stomach is within normal limits. The appendix is  not clearly identified. No evidence of bowel wall thickening, distention, or inflammatory changes. Vascular/Lymphatic: Marked severity aortic calcification. No enlarged abdominal or pelvic lymph nodes. Reproductive: The prostate gland is moderately enlarged Other: Multiple surgical clips are seen along the right inguinal region. Musculoskeletal: Multilevel degenerative changes seen throughout the lumbar spine. IMPRESSION: 1. 1. Heterogeneous low-attenuation soft tissue masses within the posterior and posteromedial aspects of the right upper lobe, with extension to the adjacent pleura and chest wall, and associated cortical destruction of the posterior aspect of the third right rib, posterior fourth right rib, T3 and T4 vertebral bodies. Extension into the spinal canal with subsequent marked severity narrowing and spinal cord impingement is seen. MRI correlation is recommended. 2. Pathologic compression fracture deformity of the T3 vertebral  body which likely represents sequelae from the patient's recent upper back trauma. 3. Stable, irregular appearing areas of low attenuation within the anterior and posterior aspects of the right upper lobe, which may represent areas of scarring, however malignancy cannot be excluded. 4. Multiple heterogeneous low-attenuation liver lesions of various sizes, consistent with metastatic disease. 5. Stable moderate to marked severity right apical scarring and/or atelectasis. 6. Advanced coronary artery calcification. 7. Moderate to marked severity aortic calcification. 8. Enlarged prostate gland. 9. Degenerative changes of the lumbar spine. Aortic Atherosclerosis (ICD10-I70.0) and Emphysema (ICD10-J43.9). Electronically Signed   By: Virgina Norfolk M.D.   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  Discussed stage and pathology with the patient/and brother.  #Discussed treatment options of palliative; not curative.  Patient would benefit from systemic chemotherapy-carbo Alimta Keytruda after finishing radiation.  Will discuss further at next visit.  #Bone metastases; spinal cord compression status post decompressive surgery and fusion.  -Patient would benefit from Zometa; discuss at next visit.  Will recommend radiation ASAP.  #Malnutrition/cachexia-discussed regarding protein supplementation; will make a dietary referral.  #Spinal cord compression with residual weakness-continuing physical therapy.   # COPD-stable on Trelegy.  Thank you, for allowing me to participate in the care of your pleasant patient. Please do not hesitate to contact me with questions or concerns in the interim.  # DISPOSITION: release # labs today- cbc/cmp/cea # nutrition # palliative care in 2 weeks # Radiation referral Dr.Crystal ASAP- lung cancer/spine mets # follow up in 2 weeks; MD; No labs- Dr.B  Addendum:  We will also recommend a PET scan for further evaluation.  All questions were answered. The patient knows to call the clinic with any problems, questions or concerns.    Cammie Sickle, MD 11/27/2019 10:04 PM

## 2019-11-24 ENCOUNTER — Ambulatory Visit: Payer: Medicare HMO | Admitting: Urology

## 2019-11-24 ENCOUNTER — Encounter: Payer: Self-pay | Admitting: Urology

## 2019-11-24 DIAGNOSIS — Z483 Aftercare following surgery for neoplasm: Secondary | ICD-10-CM | POA: Diagnosis not present

## 2019-11-24 DIAGNOSIS — M8458XD Pathological fracture in neoplastic disease, other specified site, subsequent encounter for fracture with routine healing: Secondary | ICD-10-CM | POA: Diagnosis not present

## 2019-11-24 DIAGNOSIS — J449 Chronic obstructive pulmonary disease, unspecified: Secondary | ICD-10-CM | POA: Diagnosis not present

## 2019-11-24 DIAGNOSIS — M4802 Spinal stenosis, cervical region: Secondary | ICD-10-CM | POA: Diagnosis not present

## 2019-11-24 DIAGNOSIS — C801 Malignant (primary) neoplasm, unspecified: Secondary | ICD-10-CM | POA: Diagnosis not present

## 2019-11-24 DIAGNOSIS — M47816 Spondylosis without myelopathy or radiculopathy, lumbar region: Secondary | ICD-10-CM | POA: Diagnosis not present

## 2019-11-24 DIAGNOSIS — N400 Enlarged prostate without lower urinary tract symptoms: Secondary | ICD-10-CM | POA: Diagnosis not present

## 2019-11-24 DIAGNOSIS — C7951 Secondary malignant neoplasm of bone: Secondary | ICD-10-CM | POA: Diagnosis not present

## 2019-11-24 DIAGNOSIS — M47812 Spondylosis without myelopathy or radiculopathy, cervical region: Secondary | ICD-10-CM | POA: Diagnosis not present

## 2019-11-24 DIAGNOSIS — R69 Illness, unspecified: Secondary | ICD-10-CM | POA: Diagnosis not present

## 2019-11-24 LAB — CEA: CEA: 2113 ng/mL — ABNORMAL HIGH (ref 0.0–4.7)

## 2019-11-25 ENCOUNTER — Encounter: Payer: Self-pay | Admitting: Family Medicine

## 2019-11-25 ENCOUNTER — Ambulatory Visit (INDEPENDENT_AMBULATORY_CARE_PROVIDER_SITE_OTHER): Payer: Medicare HMO | Admitting: Family Medicine

## 2019-11-25 DIAGNOSIS — C3411 Malignant neoplasm of upper lobe, right bronchus or lung: Secondary | ICD-10-CM

## 2019-11-25 DIAGNOSIS — C7951 Secondary malignant neoplasm of bone: Secondary | ICD-10-CM | POA: Diagnosis not present

## 2019-11-25 DIAGNOSIS — E440 Moderate protein-calorie malnutrition: Secondary | ICD-10-CM | POA: Diagnosis not present

## 2019-11-25 DIAGNOSIS — C3490 Malignant neoplasm of unspecified part of unspecified bronchus or lung: Secondary | ICD-10-CM

## 2019-11-25 DIAGNOSIS — Z87891 Personal history of nicotine dependence: Secondary | ICD-10-CM

## 2019-11-25 NOTE — Progress Notes (Signed)
Name: Cody [name]   MRN: [id number]    DOB: 04-04-1956   Date:11/27/2019       Progress Note  Subjective  Chief Complaint  Chief Complaint  Patient presents with  . Shortness of Breath  . Lung Cancer    I connected with  [name] on 11/27/19 at  3:40 PM EDT by telephone and verified that I am speaking with the correct person using two identifiers.  I discussed the limitations, risks, security and privacy concerns of performing an evaluation and management service by telephone and the availability of in person appointments. Staff also discussed with the patient that there may be a patient responsible charge related to this service. Patient Location: [facility] Provider Location: [place]   HPI  Lung Cancer : he was admitted on 10/31/2019 for evaluation of numbness from the waist down. He was found to have a mass compressing his spinal cord. He was transferred to [facility] and admitted there on 11/01/2019 and discharged on  with diagnosis of NSCLC metastatic to the bone, he had a T1-T6 spinal laminectomy on 11/07/2019 , he was discharged home on 11/13/2019 and had been getting PT and OT at home. He states pain has been under control but he has been feeling SOB with activity. He is currently under the care of oncologist and radiation oncologist at [facility] since it will be easier than going to [facility].  He states only mild cough. He no longer smokes. He has been afebrile and has great appetite. His weight at [facility] discharge was 55 kg. He denies being depressed, he states he has good family support  Patient Active Problem List   Diagnosis Date Noted  . Cancer of upper lobe of right lung ([place]) 11/23/2019  . Metastatic cancer to spine ([place]) 11/01/2019  . BPH with elevated PSA 07/16/2015  . COPD bronchitis 07/16/2015  . Seasonal and perennial allergic rhinitis 04/24/2010    Past Surgical History:  Procedure Laterality Date  . HERNIA REPAIR Bilateral 1982  . HERNIA REPAIR Right 1994  . LAMINECTOMY  FOR EXCISION / EVACUATION INTRASPINAL LESION  11/07/2019   T1-T 6 at [facility]     Family History  Problem Relation Age of Onset  . Heart disease Father   . CVA Mother   . Lung cancer Sister   . Lung cancer Brother     Social History   Socioeconomic History  . Marital status: Married    Spouse name: Cody [name]   . Number of children: 4  . Years of education: Not on file  . Highest education level: Some college, no degree  Occupational History  . Occupation: dispatch and delivery   Tobacco Use  . Smoking status: Former Smoker    Packs/day: 2.00    Years: 30.00    Pack years: 60.00    Quit date: 07/29/2011    Years since quitting: 8.3  . Smokeless tobacco: Never Used  . Tobacco comment: pt vapes  Substance and Sexual Activity  . Alcohol use: Yes    Alcohol/week: 5.0 standard drinks    Types: 5 Standard drinks or equivalent per week    Comment: 1-2/day  . Drug use: No  . Sexual activity: Not Currently    Partners: Female  Other Topics Concern  . Not on file  Social History Narrative   Worked in warehouse/ quit smoking in 2015; beer 1/day; in [place]; with wife.    Social Determinants of Health   Financial Resource Strain: Low Risk   .  Difficulty of Paying Living Expenses: Not hard at all  Food Insecurity: No Food Insecurity  . Worried About Charity fundraiser in the Last Year: Never true  . Ran Out of Food in the Last Year: Never true  Transportation Needs: No Transportation Needs  . Lack of Transportation (Medical): No  . Lack of Transportation (Non-Medical): No  Physical Activity: Insufficiently Active  . Days of Exercise per Week: 7 days  . Minutes of Exercise per Session: 20 min  Stress: No Stress Concern Present  . Feeling of Stress : Not at all  Social Connections: Not Isolated  . Frequency of Communication with Friends and Family: More than three times a week  . Frequency of Social Gatherings with Friends and Family: More than three times a week  . Attends  Religious Services: More than 4 times per year  . Active Member of Clubs or Organizations: Yes  . Attends Archivist Meetings: More than 4 times per year  . Marital Status: Married  Human resources officer Violence: Not At Risk  . Fear of Current or Ex-Partner: No  . Emotionally Abused: No  . Physically Abused: No  . Sexually Abused: No     Current Outpatient Medications:  .  Fluticasone-Umeclidin-Vilant 100-62.5-25 MCG/INH AEPB, Inhale into the lungs., Disp: , Rfl:  .  Multiple Vitamins-Minerals (MULTIVITAMIN GUMMIES MENS PO), Take by mouth., Disp: , Rfl:  .  acetaminophen (TYLENOL) 500 MG tablet, Take 500 mg by mouth every 6 (six) hours as needed., Disp: , Rfl:   No Known Allergies  I personally reviewed active problem list, medication list, allergies, family history, social history, health maintenance with the patient/caregiver today.   ROS  Ten systems reviewed and is negative except as mentioned in HPI   Objective  Virtual encounter, vitals not obtained. Pulse ox at home was 87 %   There is no height or weight on file to calculate BMI.  Physical Exam  Awake, alert and oriented with sob noticed during our conversation   No results found for this or any previous visit (from the past 62 hour(s)).  PHQ2/9: Depression screen [facility] 2/9 11/25/2019 10/21/2019 03/16/2017 10/10/2015 07/16/2015  Decreased Interest 0 0 0 0 0  Down, Depressed, Hopeless 0 1 0 0 0  PHQ - 2 Score 0 1 0 0 0  Altered sleeping 0 0 - - -  Tired, decreased energy 0 0 - - -  Change in appetite 0 0 - - -  Feeling bad or failure about yourself  0 0 - - -  Trouble concentrating 0 0 - - -  Moving slowly or fidgety/restless 0 0 - - -  Suicidal thoughts 0 0 - - -  PHQ-9 Score 0 1 - - -  Difficult doing work/chores - Not difficult at all - - -   PHQ-2/9 Result is negative.    Fall Risk: Fall Risk  11/25/2019 10/21/2019 10/21/2019 03/16/2017 10/10/2015  Falls in the past year? 0 0 0 No No  Number falls in  past yr: 0 0 0 - -  Injury with Fall? 0 0 0 - -     Assessment & Plan  1. Lung cancer metastatic to bone ([place])  - For home use only DME oxygen - Ambulatory referral to Home Health  2. Cancer of upper lobe of right lung [facility])  - Ambulatory referral to Home Health  3. Moderate protein-calorie malnutrition ([place])  He has a good appetite, but still not gaining much weight, discussed  high calorie diet   I discussed the assessment and treatment plan with the patient. The patient was provided an opportunity to ask questions and all were answered. The patient agreed with the plan and demonstrated an understanding of the instructions.   The patient was advised to call back or seek an in-person evaluation if the symptoms worsen or if the condition fails to improve as anticipated.  I provided 25  minutes of non-face-to-face time during this encounter.  Loistine Chance, MD

## 2019-11-27 ENCOUNTER — Encounter: Payer: Self-pay | Admitting: Family Medicine

## 2019-11-28 ENCOUNTER — Telehealth: Payer: Self-pay | Admitting: Internal Medicine

## 2019-11-28 DIAGNOSIS — R69 Illness, unspecified: Secondary | ICD-10-CM | POA: Diagnosis not present

## 2019-11-28 DIAGNOSIS — N400 Enlarged prostate without lower urinary tract symptoms: Secondary | ICD-10-CM | POA: Diagnosis not present

## 2019-11-28 DIAGNOSIS — C7951 Secondary malignant neoplasm of bone: Secondary | ICD-10-CM | POA: Diagnosis not present

## 2019-11-28 DIAGNOSIS — C3490 Malignant neoplasm of unspecified part of unspecified bronchus or lung: Secondary | ICD-10-CM

## 2019-11-28 DIAGNOSIS — M47812 Spondylosis without myelopathy or radiculopathy, cervical region: Secondary | ICD-10-CM | POA: Diagnosis not present

## 2019-11-28 DIAGNOSIS — Z483 Aftercare following surgery for neoplasm: Secondary | ICD-10-CM | POA: Diagnosis not present

## 2019-11-28 DIAGNOSIS — M4802 Spinal stenosis, cervical region: Secondary | ICD-10-CM | POA: Diagnosis not present

## 2019-11-28 DIAGNOSIS — M47816 Spondylosis without myelopathy or radiculopathy, lumbar region: Secondary | ICD-10-CM | POA: Diagnosis not present

## 2019-11-28 DIAGNOSIS — J449 Chronic obstructive pulmonary disease, unspecified: Secondary | ICD-10-CM | POA: Diagnosis not present

## 2019-11-28 DIAGNOSIS — C801 Malignant (primary) neoplasm, unspecified: Secondary | ICD-10-CM | POA: Diagnosis not present

## 2019-11-28 DIAGNOSIS — M8458XD Pathological fracture in neoplastic disease, other specified site, subsequent encounter for fracture with routine healing: Secondary | ICD-10-CM | POA: Diagnosis not present

## 2019-11-28 NOTE — Telephone Encounter (Signed)
Cody [name]- please inform pt that I would like to get PET scan ASAP. It ordered. Also, please get me a copy of NGS [? Ordered at [place]   C- please schedule ASAP.  GB

## 2019-11-29 ENCOUNTER — Other Ambulatory Visit: Payer: Self-pay

## 2019-11-29 ENCOUNTER — Observation Stay
Admission: EM | Admit: 2019-11-29 | Discharge: 2019-11-30 | Disposition: A | Discharge status: Home / Self Care | Payer: Medicare HMO | Attending: Internal Medicine | Admitting: Internal Medicine

## 2019-11-29 ENCOUNTER — Emergency Department: Payer: Medicare HMO

## 2019-11-29 ENCOUNTER — Encounter: Payer: Self-pay | Admitting: *Deleted

## 2019-11-29 DIAGNOSIS — Z79899 Other long term (current) drug therapy: Secondary | ICD-10-CM | POA: Insufficient documentation

## 2019-11-29 DIAGNOSIS — C7951 Secondary malignant neoplasm of bone: Secondary | ICD-10-CM | POA: Diagnosis present

## 2019-11-29 DIAGNOSIS — R972 Elevated prostate specific antigen [PSA]: Secondary | ICD-10-CM | POA: Insufficient documentation

## 2019-11-29 DIAGNOSIS — C3410 Malignant neoplasm of upper lobe, unspecified bronchus or lung: Secondary | ICD-10-CM

## 2019-11-29 DIAGNOSIS — N400 Enlarged prostate without lower urinary tract symptoms: Secondary | ICD-10-CM | POA: Diagnosis not present

## 2019-11-29 DIAGNOSIS — Z20822 Contact with and (suspected) exposure to covid-19: Secondary | ICD-10-CM | POA: Diagnosis not present

## 2019-11-29 DIAGNOSIS — J441 Chronic obstructive pulmonary disease with (acute) exacerbation: Secondary | ICD-10-CM | POA: Diagnosis not present

## 2019-11-29 DIAGNOSIS — J9621 Acute and chronic respiratory failure with hypoxia: Principal | ICD-10-CM

## 2019-11-29 DIAGNOSIS — C3411 Malignant neoplasm of upper lobe, right bronchus or lung: Secondary | ICD-10-CM | POA: Diagnosis not present

## 2019-11-29 DIAGNOSIS — C7949 Secondary malignant neoplasm of other parts of nervous system: Secondary | ICD-10-CM | POA: Diagnosis not present

## 2019-11-29 DIAGNOSIS — R0902 Hypoxemia: Secondary | ICD-10-CM | POA: Diagnosis present

## 2019-11-29 DIAGNOSIS — Z87891 Personal history of nicotine dependence: Secondary | ICD-10-CM | POA: Insufficient documentation

## 2019-11-29 DIAGNOSIS — R0602 Shortness of breath: Secondary | ICD-10-CM | POA: Diagnosis not present

## 2019-11-29 LAB — CBC
HCT: 38.1 % — ABNORMAL LOW (ref 39.0–52.0)
Hemoglobin: 12.3 g/dL — ABNORMAL LOW (ref 13.0–17.0)
MCH: 29.8 pg (ref 26.0–34.0)
MCHC: 32.3 g/dL (ref 30.0–36.0)
MCV: 92.3 fL (ref 80.0–100.0)
Platelets: 434 10*3/uL — ABNORMAL HIGH (ref 150–400)
RBC: 4.13 MIL/uL — ABNORMAL LOW (ref 4.22–5.81)
RDW: 16.3 % — ABNORMAL HIGH (ref 11.5–15.5)
WBC: 8.1 10*3/uL (ref 4.0–10.5)
nRBC: 0 % (ref 0.0–0.2)

## 2019-11-29 LAB — COMPREHENSIVE METABOLIC PANEL
ALT: 35 U/L (ref 0–44)
AST: 42 U/L — ABNORMAL HIGH (ref 15–41)
Albumin: 3.3 g/dL — ABNORMAL LOW (ref 3.5–5.0)
Alkaline Phosphatase: 266 U/L — ABNORMAL HIGH (ref 38–126)
Anion gap: 10 (ref 5–15)
BUN: 18 mg/dL (ref 8–23)
CO2: 27 mmol/L (ref 22–32)
Calcium: 8.8 mg/dL — ABNORMAL LOW (ref 8.9–10.3)
Chloride: 97 mmol/L — ABNORMAL LOW (ref 98–111)
Creatinine, Ser: 0.62 mg/dL (ref 0.61–1.24)
GFR calc Af Amer: >60 mL/min (ref >60)
GFR calc non Af Amer: >60 mL/min (ref >60)
Glucose, Bld: 108 mg/dL — ABNORMAL HIGH (ref 70–99)
Potassium: 4.1 mmol/L (ref 3.5–5.1)
Sodium: 134 mmol/L — ABNORMAL LOW (ref 135–145)
Total Bilirubin: 0.7 mg/dL (ref 0.3–1.2)
Total Protein: 7.4 g/dL (ref 6.5–8.1)

## 2019-11-29 LAB — RESPIRATORY PANEL BY RT PCR (FLU A&B, COVID)
Influenza A by PCR: NEGATIVE
Influenza B by PCR: NEGATIVE
SARS Coronavirus 2 by RT PCR: NEGATIVE

## 2019-11-29 LAB — TROPONIN I (HIGH SENSITIVITY): Troponin I (High Sensitivity): 7 ng/L (ref <18)

## 2019-11-29 LAB — HIV ANTIBODY (ROUTINE TESTING W REFLEX): HIV Screen 4th Generation wRfx: NONREACTIVE

## 2019-11-29 LAB — BRAIN NATRIURETIC PEPTIDE: B Natriuretic Peptide: 68 pg/mL (ref 0.0–100.0)

## 2019-11-29 MED ORDER — METHYLPREDNISOLONE SODIUM SUCC 40 MG IJ SOLR
40.0000 mg | Freq: Four times a day (QID) | INTRAMUSCULAR | Status: AC
  Administered 2019-11-29 (×3): 40 mg via INTRAVENOUS
  Filled 2019-11-29 (×3): qty 1

## 2019-11-29 MED ORDER — IPRATROPIUM-ALBUTEROL 0.5-2.5 (3) MG/3ML IN SOLN
3.0000 mL | Freq: Once | RESPIRATORY_TRACT | Status: AC
  Administered 2019-11-29: 3 mL via RESPIRATORY_TRACT
  Filled 2019-11-29: qty 3

## 2019-11-29 MED ORDER — ENOXAPARIN SODIUM 40 MG/0.4ML ~~LOC~~ SOLN
40.0000 mg | SUBCUTANEOUS | Status: DC
  Administered 2019-11-29: 40 mg via SUBCUTANEOUS
  Filled 2019-11-29: qty 0.4

## 2019-11-29 MED ORDER — IPRATROPIUM-ALBUTEROL 0.5-2.5 (3) MG/3ML IN SOLN
3.0000 mL | Freq: Four times a day (QID) | RESPIRATORY_TRACT | Status: DC
  Administered 2019-11-29 – 2019-11-30 (×4): 3 mL via RESPIRATORY_TRACT
  Filled 2019-11-29 (×5): qty 3

## 2019-11-29 MED ORDER — IOHEXOL 350 MG/ML SOLN
75.0000 mL | Freq: Once | INTRAVENOUS | Status: AC | PRN
  Administered 2019-11-29: 75 mL via INTRAVENOUS

## 2019-11-29 MED ORDER — ALBUTEROL SULFATE (2.5 MG/3ML) 0.083% IN NEBU: 2.5000 mg | INHALATION_SOLUTION | RESPIRATORY_TRACT | Status: DC | PRN

## 2019-11-29 MED ORDER — METHYLPREDNISOLONE SODIUM SUCC 125 MG IJ SOLR
125.0000 mg | Freq: Once | INTRAMUSCULAR | Status: AC
  Administered 2019-11-29: 125 mg via INTRAVENOUS
  Filled 2019-11-29: qty 2

## 2019-11-29 MED ORDER — PREDNISONE 20 MG PO TABS
40.0000 mg | ORAL_TABLET | Freq: Every day | ORAL | Status: DC
  Administered 2019-11-30: 40 mg via ORAL
  Filled 2019-11-29: qty 2

## 2019-11-29 MED ORDER — LORAZEPAM 0.5 MG PO TABS
0.5000 mg | ORAL_TABLET | Freq: Four times a day (QID) | ORAL | Status: DC | PRN
  Administered 2019-11-29: 0.5 mg via ORAL
  Filled 2019-11-29: qty 1

## 2019-11-29 NOTE — ED Notes (Signed)
Resp at bedside. Pt O2 sats were 74% on RA on arrival to room. Placed on 6LPM and increased to 100%. Resp placing pt on BIPAP at this time.

## 2019-11-29 NOTE — H&P (Addendum)
History and Physical    WOODRUFF SKIRVIN BOF:[id number] DOB: 1956-02-27 DOA: 11/29/2019  PCP: Steele Sizer, MD   Patient coming from: Home I have personally briefly reviewed patient's old medical records in [place]  Chief Complaint: Shortness of breath  HPI: Cody [name] is a 64 y.o. male with medical history significant for COPD, fairly recently diagnosed non-small cell carcinoma of the lung metastatic to the spine, status post T1-T4 laminectomy recently at [facility] on 11/13/2019 secondary to spinal cord compression from a large metastatic mass with residual paraparesis, now ambulant with a walker, last seen at [facility] on 11/25/2019 by his PCP on the same day who presents with a several day history of shortness of breath.  Patient saw his PCP just a few days prior with shortness of breath believed related to his COPD and was discharged on home oxygen.  He now complains of progression of symptoms not relieved with home bronchodilator therapy or oxygen therapy prescribed.  He denies cough fever or chills.  Denies chest pain or back pain.   O2 sats 74% on room air  ED Course: On arrival patient was tachypneic at 26 O2 sats in the 70s and he was transitioned to BiPAP.  Blood work was for the most part unremarkable.  Covid and flu test negative.  CTA chest showed no evidence of acute PE but redemonstrated multiple right upper lobe pulmonary masses with associated bony destruction T3-T4 and third and fourth ribs.  Enhancing lesions were also seen in the right and left lobe of the liver.  Patient was treated with bronchodilators in the emergency room.  Hospitalist consulted for admission  Review of Systems: As per HPI otherwise 10 point review of systems negative.    Past Medical History:  Diagnosis Date  . Allergy   . COPD (chronic obstructive pulmonary disease) ([place])   . Prostate enlargement     Past Surgical History:  Procedure Laterality Date  . HERNIA REPAIR Bilateral 1982  . HERNIA  REPAIR Right 1994  . LAMINECTOMY FOR EXCISION / EVACUATION INTRASPINAL LESION  11/07/2019   T1-T 6 at [place]      reports that he quit smoking about 8 years ago. He has a 60.00 pack-year smoking history. He has never used smokeless tobacco. He reports current alcohol use of about 5.0 standard drinks of alcohol per week. He reports that he does not use drugs.  No Known Allergies  Family History  Problem Relation Age of Onset  . Heart disease Father   . CVA Mother   . Lung cancer Sister   . Lung cancer Brother      Prior to Admission medications   Medication Sig Start Date End Date Taking? Authorizing Provider  acetaminophen (TYLENOL) 500 MG tablet Take 500 mg by mouth every 6 (six) hours as needed.    [provider]  Fluticasone-Umeclidin-Vilant 100-62.5-25 MCG/INH AEPB Inhale into the lungs. 11/14/19 12/14/19  [provider]  Multiple Vitamins-Minerals (MULTIVITAMIN GUMMIES MENS PO) Take by mouth.    [provider]    Physical Exam: Vitals:   11/29/19 0300 11/29/19 0336 11/29/19 0400 11/29/19 0430  BP: 102/89 118/75 117/75 114/75  Pulse: 80 83 78 78  Resp: (!) 21 17 17 17   Temp:      TempSrc:      SpO2: 100% 100% 100% 100%  Weight:      Height:         Vitals:   11/29/19 0300 11/29/19 0336 11/29/19 0400 11/29/19  0430  BP: 102/89 118/75 117/75 114/75  Pulse: 80 83 78 78  Resp: (!) 21 17 17 17   Temp:      TempSrc:      SpO2: 100% 100% 100% 100%  Weight:      Height:        Constitutional: Alert and awake, oriented x3, conversational dyspnea Eyes: PERLA, EOMI, irises appear normal, anicteric sclera,  ENMT: external ears and nose appear normal, normal hearing            Neck: neck appears normal, no masses, normal ROM, no thyromegaly, no JVD  CVS: S1-S2 clear, no murmur rubs or gallops,  , no carotid bruits, pedal pulses palpable, No LE edema Respiratory: Decreased air entry bilaterally, no wheezing, rales or rhonchi. Respiratory effort  increased and patient tachypneic.  Now off BiPAP on nasal cannula.  Abdomen: soft nontender, nondistended, normal bowel sounds, no hepatosplenomegaly, no hernias Musculoskeletal: : no cyanosis, clubbing , no contractures or atrophy Neuro: Cranial nerves II-XII intact, sensation, reflexes normal, strength Psych: judgement and insight appear normal, stable mood and affect,  Skin: no rashes or lesions or ulcers, no induration or nodules   Labs on Admission: I have personally reviewed following labs and imaging studies  CBC: Recent Labs  Lab 11/23/19 1222 11/29/19 0247  WBC 7.1 8.1  NEUTROABS 5.2  --   HGB 11.0* 12.3*  HCT 33.8* 38.1*  MCV 92.1 92.3  PLT 336 433*   Basic Metabolic Panel: Recent Labs  Lab 11/23/19 1222 11/29/19 0247  NA 136 134*  K 4.2 4.1  CL 101 97*  CO2 26 27  GLUCOSE 141* 108*  BUN 22 18  CREATININE 0.59* 0.62  CALCIUM 8.2* 8.8*   GFR: Estimated Creatinine Clearance: 70.8 mL/min (by C-G formula based on SCr of 0.62 mg/dL). Liver Function Tests: Recent Labs  Lab 11/23/19 1222 11/29/19 0247  AST 33 42*  ALT 39 35  ALKPHOS 244* 266*  BILITOT 0.5 0.7  PROT 6.4* 7.4  ALBUMIN 2.8* 3.3*   No results for input(s): LIPASE, AMYLASE in the last 168 hours. No results for input(s): AMMONIA in the last 168 hours. Coagulation Profile: No results for input(s): INR, PROTIME in the last 168 hours. Cardiac Enzymes: No results for input(s): CKTOTAL, CKMB, CKMBINDEX, TROPONINI in the last 168 hours. BNP (last 3 results) No results for input(s): PROBNP in the last 8760 hours. HbA1C: No results for input(s): HGBA1C in the last 72 hours. CBG: No results for input(s): GLUCAP in the last 168 hours. Lipid Profile: No results for input(s): CHOL, HDL, LDLCALC, TRIG, CHOLHDL, LDLDIRECT in the last 72 hours. Thyroid Function Tests: No results for input(s): TSH, T4TOTAL, FREET4, T3FREE, THYROIDAB in the last 72 hours. Anemia Panel: No results for input(s):  VITAMINB12, FOLATE, FERRITIN, TIBC, IRON, RETICCTPCT in the last 72 hours. Urine analysis: No results found for: COLORURINE, APPEARANCEUR, LABSPEC, PHURINE, GLUCOSEU, HGBUR, BILIRUBINUR, KETONESUR, PROTEINUR, UROBILINOGEN, NITRITE, LEUKOCYTESUR  Radiological Exams on Admission: CT Angio Chest PE W and/or Wo Contrast  Result Date: 11/29/2019 CLINICAL DATA:  Shortness of breath and dyspnea, known lung cancer, recent spinal survey EXAM: CT ANGIOGRAPHY CHEST WITH CONTRAST TECHNIQUE: Multidetector CT imaging of the chest was performed using the standard protocol during bolus administration of intravenous contrast. Multiplanar CT image reconstructions and MIPs were obtained to evaluate the vascular anatomy. CONTRAST:  84mL OMNIPAQUE IOHEXOL 350 MG/ML SOLN COMPARISON:  CT 10/31/2019, radiograph 11/29/2019 FINDINGS: Cardiovascular: Satisfactory opacification the pulmonary arteries to the segmental level. No pulmonary artery  filling defects are identified. Central pulmonary arteries are normal caliber. Normal cardiac size. No pericardial effusion. Few coronary artery calcifications are present. Calcific plaque is present within the normal caliber thoracic aorta. Normal 3 vessel branching of the aortic arch with minimal calcification of the proximal great vessels. Mediastinum/Nodes: No mediastinal fluid or gas. Normal thyroid gland and thoracic inlet. No acute abnormality of the trachea or esophagus. No worrisome mediastinal, hilar or axillary adenopathy. Lungs/Pleura: Severe centrilobular and paraseptal emphysematous changes. Multiple right upper lobe pulmonary masses are redemonstrated is include a 2.2 x 1.8 cm spiculated region of low attenuation in the anterior apex (6/22). A smaller 1.3 by 0.9 cm focus is seen in the posterior apex (6/15). There is a more ill-defined heterogeneous region of soft tissue density extending across the posteromedial aspect of the right upper lobe and apex with extension into the chest  chest wall with associated bony destruction the T3 and T4 vertebral bodies and third rib posteriorly. This is grossly similar in size and appearance to the comparison exam though difficult to fully assess given the phase of contrast timing and extensive streak artifact from patient's newly placed thoracic spine hardware which limits of soft tissue and bone resolution. A more smoothly marginated extension possibly a separate mass further extends laterally with destruction fourth rib in involvement of the chest wall similar to prior exam as well. Upper Abdomen: Redemonstration of the numerous peripherally enhancing lesions seen throughout the right and left lobe liver, largest in the right lobe measuring to 2.7 cm in size. No other acute upper abdominal abnormality. Musculoskeletal: Interval placement a T1-T6 posterior thoracic spinal fusion bridging the destructive pathologic fractures of the T3 and T4 vertebral bodies with transpedicular screws at the T1, T2, T5 and T6 levels. Paired posterior struts are noted on chest radiography. No acute hardware complication is seen. Hardware does result in significant streak artifact which limits evaluation of osseous structures and adjacent soft tissues. Destructive changes of the right chest wall and third and fourth ribs, as detailed above. Extension into the canal is difficult to assess on this examination. Review of the MIP images confirms the above findings. IMPRESSION: 1. No evidence of acute pulmonary embolism. 2. Redemonstration of multiple right upper lobe pulmonary masses with associated bony destruction of the T3 and T4 vertebral bodies and third and fourth ribs posteriorly as well as gross invasion of the posterior chest wall. The degree of extension into the spinal canal is less well visualized on this study due to streak from interval placement of a T1-T6 spinal fusion. 3. Multiple peripherally enhancing lesions seen throughout the right and left lobe liver,  largest in the right lobe measuring to 2.7 cm in size, compatible with metastatic disease. 4. No new pulmonary nodules or masses. No acute superimposed intrathoracic process. 5. Aortic Atherosclerosis (ICD10-I70.0) 6. Emphysema (ICD10-J43.9). Electronically Signed   By: Lovena Le M.D.   On: 11/29/2019 03:56   DG Chest Portable 1 View  Result Date: 11/29/2019 CLINICAL DATA:  Shortness of breath for 2-3 days, history of lung cancer EXAM: PORTABLE CHEST 1 VIEW COMPARISON:  CT 10/31/2019, radiograph 05/08/2017 FINDINGS: Redemonstration of the spiculated mass in the right lung apex with associated destructive changes of the posterior right third rib and upper thoracic vertebrae bridged by the vertebral fusion rods. Findings on a background of severe centrilobular and paraseptal emphysematous changes better demonstrated on comparison cross-sectional imaging. Chronic bronchitic features are present as well. No new consolidative opacity. No convincing features of pulmonary edema.  Cardiomediastinal contours are stable. IMPRESSION: 1. Redemonstrated masses towards the right lung apex with associated destructive changes of the posterior right third rib and upper thoracic vertebrae bridged by hardware. 2. No acute cardiopulmonary abnormality. 3. Chronic bronchitic and emphysematous changes. Electronically Signed   By: Lovena Le M.D.   On: 11/29/2019 03:16    EKG: Independently reviewed.   Assessment/Plan Principal Problem:    Acute and chronic respiratory failure with hypoxia (HCC) -Suspect related to a combination of COPD exacerbation and underlying lung cancer -Patient presents with progressive shortness of breath with O2 sats in the 70s on room air in the setting of  recent history of metastatic lung cancer and recent surgical intervention at [facility] for T1-T4 laminectomy due to compressive metastatic lesion on the spine -CTA chest ruled out acute PE -Patient was recently started on home O2 by his PCP but  it had not yet been set up with home health -Continue BiPAP and wean to O2 via nasal cannula as tolerated with goal to keep sats over 90%  COPD with acute exacerbation (HCC) -Scheduled and as needed bronchodilator treatments -IV steroids    Non-small cell cancer of upper lobe of lung ([place]) metastatic to spine and liver -Continue oncology follow-up -Consider palliative care referral  Numbness/paraparesis Underweight suspect protein calorie malnutrition/cancer related cachexia - ambulates with walker -PT, transitional care consult, nutritionist evaluation    DVT prophylaxis: Lovenox  Code Status: full code  Family Communication: Son, at bedside, Terence Lux Disposition Plan: Back to previous home environment Consults called: none  [place] MD [facility]     11/29/2019, 4:48 AM

## 2019-11-29 NOTE — ED Triage Notes (Addendum)
Pt to triage with sob for 2-3 days.  Hx lung cancer.  No chest pain.   Newly dx with cancer, treated at [facility].  No n/v/d  Pt alert  Speech clear.

## 2019-11-29 NOTE — ED Notes (Signed)
Pt to CT

## 2019-11-29 NOTE — ED Notes (Addendum)
Pt ambulatory with walker to bed. Pt states he got winded and felt short of breath. Sam, RN aware and is at bedside. Pt had BM, hand hygiene preformed. Pt sitting in chair beside bed eating breakfast. Call light within reach. Pt has no further needs at this time. O2 saturation 94% on 4L via [place].

## 2019-11-29 NOTE — Telephone Encounter (Signed)
Message left with [facility] to fax results from NGS testing.   Noted pt currently in ED, sent auth request for PET scan per [facility]. Will wait before schedule to see if pt will be discharged.

## 2019-11-29 NOTE — ED Notes (Signed)
Pt reports feeling SOB "since the weekend" and states he was not able to catch his breath prior to arriving to ED.

## 2019-11-29 NOTE — Evaluation (Signed)
Occupational Therapy Evaluation Patient Details Name: Cody [name] MRN: [id number] DOB: [birth year] Today's Date: 11/29/2019    History of Present Illness Cody [name] is a 64 y.o. 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  Patient saw his PCP just a few days prior with shortness of breath believed related to his COPD and was discharged on home oxygen.  He now complains of progression of symptoms not relieved with home bronchodilator therapy or oxygen therapy prescribed.  O2 sats 74% on room air   Clinical Impression   Cody [name] was seen for OT evaluation this date. Prior to hospital admission, pt was requiring MIN A for LBD and bathing, lives c wife and son. Upon entry pt reports urgent need to have BM. Pt required MIN A for SPT bed>BSC - assist for balance/RW management and MIN VCs for PLB. Pt had noted increased WoB during t/f with SpO2 decreasing to 86% on 2L [place], resolved c PLB and seated rest to 92% on 2L [place]. Pt presents to acute OT demonstrating impaired ADL performance and functional mobility 2/2 decreased activity tolerance, functional balance/strength deficits, and decreased sensation to BLE. Pt educated in energy conservation strategies including pursed lip breathing, activity pacing, home/routines modifications, and falls prevention. Pt verbalized understanding and would benefit from additional skilled OT services to maximize recall and carryover of learned techniques and facilitate implementation of learned techniques into daily routines. Upon discharge, recommend [facility] services.       Follow Up  Recommendations  Home health OT    Equipment Recommendations       Recommendations for Other Services       Precautions / Restrictions Precautions Required Braces or Orthoses: (Figure 8 brace) Restrictions Weight Bearing Restrictions: No Other Position/Activity Restrictions: Figure 8 brace during OOB mobility      Mobility Bed Mobility Overal bed mobility: Needs Assistance Bed Mobility: Supine to Sit     Supine to sit: HOB elevated;Min guard        Transfers Overall transfer level: Needs assistance Equipment used: Rolling walker (2 wheeled) Transfers: Sit to/from [facility] Sit to Stand: Min assist Stand pivot transfers: Min assist       General transfer comment: MIN A for balance/RW management and MIN VCs for PLB    Balance Overall balance assessment: Needs assistance Sitting-balance support: Single extremity supported;Feet supported Sitting balance-Leahy Scale: Good     Standing balance support: Single extremity supported;During functional activity Standing balance-Leahy Scale: Fair Standing balance comment: MIN A standing balance during perihygiene/clothing management                            ADL either performed or assessed with clinical judgement   ADL Overall ADL's : Needs assistance/impaired                                       General ADL Comments: SBA toileting at [facility] c MIN A + RW for perihygiene and clothing management in standing. MOD A for  LBD. MOD A don brace seated EOB. SETUP grooming seated     Vision Baseline Vision/History: Wears glasses Wears Glasses: At all times       Perception     Praxis      Pertinent Vitals/Pain Pain Assessment: No/denies pain     Hand Dominance Right   Extremity/Trunk Assessment Upper Extremity Assessment Upper Extremity Assessment: Generalized weakness(decreased fine motor Bilaterally)   Lower Extremity Assessment Lower Extremity Assessment:  Generalized weakness(decreased sensation bilaterally)       Communication Communication Communication: No difficulties   Cognition Arousal/Alertness: Awake/alert Behavior During Therapy: WFL for tasks assessed/performed Overall Cognitive Status: Within Functional Limits for tasks assessed                                     General Comments       Exercises Exercises: Other exercises Other Exercises Other Exercises: Pt educated re: falls prevention, energy conservation, RW technique, PLB, home/routines modifications Other Exercises: Toileting including hygiene and clothing management, PLB, sup>sit, sit<>stand x2   Shoulder Instructions      Home Living Family/patient expects to be discharged to:: Private residence Living Arrangements: Spouse/significant other Available Help at Discharge: Family;Available 24 hours/day(son and wife) Type of Home: House Home Access: Ramped entrance     Home Layout: One level     Bathroom Shower/Tub: Tub/shower unit         Home Equipment: Environmental consultant - 2 wheels;Shower seat;Hand held shower head          Prior Functioning/Environment Level of Independence: Needs assistance  Gait / Transfers Assistance Needed: Pt reports RW for mobility ADL's / Homemaking Assistance Needed: Pt reports MIN A for LBD to thread and IADLs, supervision for bathing             OT Problem List: Decreased strength;Decreased range of motion;Decreased activity tolerance;Impaired balance (sitting and/or standing)      OT Treatment/Interventions: Self-care/ADL training;Therapeutic exercise;Neuromuscular education;DME and/or AE instruction;Energy conservation;Therapeutic activities;Patient/family education;Balance training    OT Goals(Current goals can be found in the care plan section) Acute Rehab OT Goals Patient Stated Goal: To breathe better OT Goal Formulation: With patient Time For Goal Achievement: 12/13/19 Potential to Achieve Goals:  Good ADL Goals Pt Will Perform Grooming: Independently;sitting Pt Will Transfer to Toilet: with modified independence;stand pivot transfer;bedside commode(c LRAD PRN) Pt Will Perform Toileting - Clothing Manipulation and hygiene: sit to/from stand;with supervision(c LRAD PRN) Additional ADL Goal #1: Pt will independently verbalize plan to implement x3 energy conservatino strategies  OT Frequency: Min 2X/week   Barriers to D/C: Inaccessible home environment          Co-evaluation              AM-PAC OT "6 Clicks" Daily Activity     Outcome Measure Help from another person eating meals?: None Help from another person taking care of personal grooming?: None Help from another person toileting, which includes using toliet, bedpan, or urinal?: A Little Help from another person bathing (including washing, rinsing, drying)?: A Lot Help from another person to put on and taking off regular upper body clothing?: None Help from another person to put on and taking off regular lower body clothing?: A Lot 6 Click Score: 19   End of Session Equipment Utilized During Treatment: Rolling walker;Oxygen(2L [place])  Activity Tolerance: Patient tolerated treatment well Patient left: in bed;Other (comment)(PT in room at end of session)  OT  Visit Diagnosis: Unsteadiness on feet (R26.81);Other abnormalities of gait and mobility (R26.89);Muscle weakness (generalized) (M62.81)                Time: 7846-9629 OT Time Calculation (min): 15 min Charges:  OT General Charges $OT Visit: 1 Visit OT Evaluation $OT Eval Low Complexity: 1 Low OT Treatments $Self Care/Home Management : 8-22 mins  {Dontray Haberland, M.S. OTR/L  11/29/19, 3:49 PM

## 2019-11-29 NOTE — ED Provider Notes (Signed)
[facility] Emergency Department Provider Note  ____________________________________________   First MD Initiated Contact with Patient 11/29/19 [phone]     (approximate)  I have reviewed the triage vital signs and the nursing notes.   HISTORY  Chief Complaint Shortness of Breath   HPI Cody [name] is a 64 y.o. male with below list of previous medical conditions including COPD metastatic lung CA recent spinal mass T1-T6 laminectomy on 11/07/2019 presents to the emergency department rest of dyspnea over the past 3 days.  Patient denies any chest pain.  Patient denies any fever afebrile on presentation.      Past Medical History:  Diagnosis Date  . Allergy   . COPD (chronic obstructive pulmonary disease) ([place])   . Prostate enlargement     Patient Active Problem List   Diagnosis Date Noted  . Acute and chronic respiratory failure with hypoxia ([place]) 11/29/2019  . Non-small cell cancer of upper lobe of lung ([place]) 11/29/2019  . COPD with acute exacerbation ([place]) 11/29/2019  . Cancer of upper lobe of right lung ([place]) 11/23/2019  . Metastatic cancer to spine ([place]) 11/01/2019  . BPH with elevated PSA 07/16/2015  . COPD bronchitis 07/16/2015  . Seasonal and perennial allergic rhinitis 04/24/2010    Past Surgical History:  Procedure Laterality Date  . HERNIA REPAIR Bilateral 1982  . HERNIA REPAIR Right 1994  . LAMINECTOMY FOR EXCISION / EVACUATION INTRASPINAL LESION  11/07/2019   T1-T 6 at [place]     Prior to Admission medications   Medication Sig Start Date End Date Taking? Authorizing Provider  acetaminophen (TYLENOL) 500 MG tablet Take 500 mg by mouth every 6 (six) hours as needed.    [provider]  Fluticasone-Umeclidin-Vilant 100-62.5-25 MCG/INH AEPB Inhale into the lungs. 11/14/19 12/14/19  [provider]  Multiple Vitamins-Minerals (MULTIVITAMIN GUMMIES MENS PO) Take by mouth.    [provider]    Allergies Patient  has no known allergies.  Family History  Problem Relation Age of Onset  . Heart disease Father   . CVA Mother   . Lung cancer Sister   . Lung cancer Brother     Social History Social History   Tobacco Use  . Smoking status: Former Smoker    Packs/day: 2.00    Years: 30.00    Pack years: 60.00    Quit date: 07/29/2011    Years since quitting: 8.3  . Smokeless tobacco: Never Used  . Tobacco comment: pt vapes  Substance Use Topics  . Alcohol use: Yes    Alcohol/week: 5.0 standard drinks    Types: 5 Standard drinks or equivalent per week    Comment: 1-2/day  . Drug use: No    Review of Systems Constitutional: No fever/chills Eyes: No visual changes. ENT: No sore throat. Cardiovascular: Denies chest pain. Respiratory: Positive for shortness of breath. Gastrointestinal: No abdominal pain.  No nausea, no vomiting.  No diarrhea.  No constipation. Genitourinary: Negative for dysuria. Musculoskeletal: Negative for neck pain.  Negative for back pain. Integumentary: Negative for rash. Neurological: Negative for headaches, focal weakness or numbness.   ____________________________________________   PHYSICAL EXAM:  VITAL SIGNS: ED Triage Vitals  Enc Vitals Group     BP 11/29/19 0236 137/68     Pulse Rate 11/29/19 0236 88     Resp 11/29/19 0236 (!) 26     Temp 11/29/19 0236 97.8 F (36.6 C)     Temp Source 11/29/19 0236 Oral     SpO2  11/29/19 0236 (!) 88 %     Weight 11/29/19 0237 54.4 kg (120 lb)     Height 11/29/19 0237 (!) 0.229 m (9")     Head Circumference --      Peak Flow --      Pain Score --      Pain Loc --      Pain Edu? --      Excl. in [place]? --     Constitutional: Alert and oriented.  Apparent respiratory difficulty Eyes: Conjunctivae are normal.  Mouth/Throat: Patient is wearing a mask. Neck: No stridor.  No meningeal signs.   Cardiovascular: Normal rate, regular rhythm. Good peripheral circulation. Grossly normal heart sounds. Respiratory:  Tachypnea, positive accessory respiratory muscle use, diffuse rhonchi Gastrointestinal: Soft and nontender. No distention.  Musculoskeletal: No lower extremity tenderness nor edema. No gross deformities of extremities. Neurologic:  Normal speech and language. No gross focal neurologic deficits are appreciated.  Skin:  Skin is warm, dry and intact. Psychiatric: Mood and affect are normal. Speech and behavior are normal.  ____________________________________________   LABS (all labs ordered are listed, but only abnormal results are displayed)  Labs Reviewed  CBC - Abnormal; Notable for the following components:      Result Value   RBC 4.13 (*)    Hemoglobin 12.3 (*)    HCT 38.1 (*)    RDW 16.3 (*)    Platelets 434 (*)    All other components within normal limits  COMPREHENSIVE METABOLIC PANEL - Abnormal; Notable for the following components:   Sodium 134 (*)    Chloride 97 (*)    Glucose, Bld 108 (*)    Calcium 8.8 (*)    Albumin 3.3 (*)    AST 42 (*)    Alkaline Phosphatase 266 (*)    All other components within normal limits  RESPIRATORY PANEL BY RT PCR (FLU A&B, COVID)  BRAIN NATRIURETIC PEPTIDE  HIV ANTIBODY (ROUTINE TESTING W REFLEX)  TROPONIN I (HIGH SENSITIVITY)   ____________________________________________  EKG  ED ECG REPORT I, [place] N Hanford Lust, the attending physician, personally viewed and interpreted this ECG.   Date: 11/29/2019  EKG Time: 2:49 AM  Rate: 80  Rhythm: Normal sinus rhythm  Axis: Normal  Intervals: Normal  ST&T Change: None  ____________________________________________  RADIOLOGY I, [place] N Nyhla Mountjoy, personally viewed and evaluated these images (plain radiographs) as part of my medical decision making, as well as reviewing the written report by the radiologist.  ED MD interpretation:    Official radiology report(s): CT Angio Chest PE W and/or Wo Contrast  Result Date: 11/29/2019 CLINICAL DATA:  Shortness of breath and dyspnea, known  lung cancer, recent spinal survey EXAM: CT ANGIOGRAPHY CHEST WITH CONTRAST TECHNIQUE: Multidetector CT imaging of the chest was performed using the standard protocol during bolus administration of intravenous contrast. Multiplanar CT image reconstructions and MIPs were obtained to evaluate the vascular anatomy. CONTRAST:  19mL OMNIPAQUE IOHEXOL 350 MG/ML SOLN COMPARISON:  CT 10/31/2019, radiograph 11/29/2019 FINDINGS: Cardiovascular: Satisfactory opacification the pulmonary arteries to the segmental level. No pulmonary artery filling defects are identified. Central pulmonary arteries are normal caliber. Normal cardiac size. No pericardial effusion. Few coronary artery calcifications are present. Calcific plaque is present within the normal caliber thoracic aorta. Normal 3 vessel branching of the aortic arch with minimal calcification of the proximal great vessels. Mediastinum/Nodes: No mediastinal fluid or gas. Normal thyroid gland and thoracic inlet. No acute abnormality of the trachea or esophagus. No worrisome mediastinal, hilar or  axillary adenopathy. Lungs/Pleura: Severe centrilobular and paraseptal emphysematous changes. Multiple right upper lobe pulmonary masses are redemonstrated is include a 2.2 x 1.8 cm spiculated region of low attenuation in the anterior apex (6/22). A smaller 1.3 by 0.9 cm focus is seen in the posterior apex (6/15). There is a more ill-defined heterogeneous region of soft tissue density extending across the posteromedial aspect of the right upper lobe and apex with extension into the chest chest wall with associated bony destruction the T3 and T4 vertebral bodies and third rib posteriorly. This is grossly similar in size and appearance to the comparison exam though difficult to fully assess given the phase of contrast timing and extensive streak artifact from patient's newly placed thoracic spine hardware which limits of soft tissue and bone resolution. A more smoothly marginated  extension possibly a separate mass further extends laterally with destruction fourth rib in involvement of the chest wall similar to prior exam as well. Upper Abdomen: Redemonstration of the numerous peripherally enhancing lesions seen throughout the right and left lobe liver, largest in the right lobe measuring to 2.7 cm in size. No other acute upper abdominal abnormality. Musculoskeletal: Interval placement a T1-T6 posterior thoracic spinal fusion bridging the destructive pathologic fractures of the T3 and T4 vertebral bodies with transpedicular screws at the T1, T2, T5 and T6 levels. Paired posterior struts are noted on chest radiography. No acute hardware complication is seen. Hardware does result in significant streak artifact which limits evaluation of osseous structures and adjacent soft tissues. Destructive changes of the right chest wall and third and fourth ribs, as detailed above. Extension into the canal is difficult to assess on this examination. Review of the MIP images confirms the above findings. IMPRESSION: 1. No evidence of acute pulmonary embolism. 2. Redemonstration of multiple right upper lobe pulmonary masses with associated bony destruction of the T3 and T4 vertebral bodies and third and fourth ribs posteriorly as well as gross invasion of the posterior chest wall. The degree of extension into the spinal canal is less well visualized on this study due to streak from interval placement of a T1-T6 spinal fusion. 3. Multiple peripherally enhancing lesions seen throughout the right and left lobe liver, largest in the right lobe measuring to 2.7 cm in size, compatible with metastatic disease. 4. No new pulmonary nodules or masses. No acute superimposed intrathoracic process. 5. Aortic Atherosclerosis (ICD10-I70.0) 6. Emphysema (ICD10-J43.9). Electronically Signed   By: Lovena Le M.D.   On: 11/29/2019 03:56   DG Chest Portable 1 View  Result Date: 11/29/2019 CLINICAL DATA:  Shortness of breath  for 2-3 days, history of lung cancer EXAM: PORTABLE CHEST 1 VIEW COMPARISON:  CT 10/31/2019, radiograph 05/08/2017 FINDINGS: Redemonstration of the spiculated mass in the right lung apex with associated destructive changes of the posterior right third rib and upper thoracic vertebrae bridged by the vertebral fusion rods. Findings on a background of severe centrilobular and paraseptal emphysematous changes better demonstrated on comparison cross-sectional imaging. Chronic bronchitic features are present as well. No new consolidative opacity. No convincing features of pulmonary edema. Cardiomediastinal contours are stable. IMPRESSION: 1. Redemonstrated masses towards the right lung apex with associated destructive changes of the posterior right third rib and upper thoracic vertebrae bridged by hardware. 2. No acute cardiopulmonary abnormality. 3. Chronic bronchitic and emphysematous changes. Electronically Signed   By: Lovena Le M.D.   On: 11/29/2019 03:16    ____________________________________________   PROCEDURES    .Critical Care Performed by: Gregor Hams, MD  Authorized by: Gregor Hams, MD   Critical care provider statement:    Critical care time (minutes):  30   Critical care time was exclusive of:  Separately billable procedures and treating other patients   Critical care was necessary to treat or prevent imminent or life-threatening deterioration of the following conditions:  Respiratory failure   Critical care was time spent personally by me on the following activities:  Development of treatment plan with patient or surrogate, discussions with consultants, evaluation of patient's response to treatment, examination of patient, obtaining history from patient or surrogate, ordering and performing treatments and interventions, ordering and review of laboratory studies, ordering and review of radiographic studies, pulse oximetry, re-evaluation of patient's condition and review of  old charts     ____________________________________________   INITIAL IMPRESSION / MDM / [place] / ED COURSE  As part of my medical decision making, I reviewed the following data within the electronic MEDICAL RECORD NUMBER  64 year old male presented with above-stated history and physical exam with differential diagnosis including but not limited to pulmonary emboli, COPD exacerbation, pneumonia, CHF cardiac etiology including ACS.  Chest x-ray revealed no evidence of pneumonia.  CT scan was performed that revealed no evidence of pulmonary emboli or pneumonia.  Laboratory data negative for COVID-19 BNP normal.  BiPAP was applied on the patient immediately upon arrival secondary to severe respiratory difficulty patient given 2 duo nebs in the emergency department 125 mg of Solu-Medrol.  Upon reevaluation patient symptoms markedly improved. ____________________________________________  FINAL CLINICAL IMPRESSION(S) / ED DIAGNOSES  Final diagnoses:  COPD with acute exacerbation ([place])     MEDICATIONS GIVEN DURING THIS VISIT:  Medications  enoxaparin (LOVENOX) injection 40 mg (has no administration in time range)  methylPREDNISolone sodium succinate (SOLU-MEDROL) 40 mg/mL injection 40 mg (has no administration in time range)    Followed by  predniSONE (DELTASONE) tablet 40 mg (has no administration in time range)  ipratropium-albuterol (DUONEB) 0.5-2.5 (3) MG/3ML nebulizer solution 3 mL (has no administration in time range)  albuterol (PROVENTIL) (2.5 MG/3ML) 0.083% nebulizer solution 2.5 mg (has no administration in time range)  iohexol (OMNIPAQUE) 350 MG/ML injection 75 mL (75 mLs Intravenous Contrast Given 11/29/19 0324)  ipratropium-albuterol (DUONEB) 0.5-2.5 (3) MG/3ML nebulizer solution 3 mL (3 mLs Nebulization Given 11/29/19 0433)  ipratropium-albuterol (DUONEB) 0.5-2.5 (3) MG/3ML nebulizer solution 3 mL (3 mLs Nebulization Given 11/29/19 0432)  methylPREDNISolone sodium succinate  (SOLU-MEDROL) 125 mg/2 mL injection 125 mg (125 mg Intravenous Given 11/29/19 0433)     ED Discharge Orders    None      *Please note:  TREVONNE NYLAND was evaluated in Emergency Department on 11/29/2019 for the symptoms described in the history of present illness. He was evaluated in the context of the global COVID-19 pandemic, which necessitated consideration that the patient might be at risk for infection with the SARS-CoV-2 virus that causes COVID-19. Institutional protocols and algorithms that pertain to the evaluation of patients at risk for COVID-19 are in a state of rapid change based on information released by regulatory bodies including the CDC and federal and state organizations. These policies and algorithms were followed during the patient's care in the ED.  Some ED evaluations and interventions may be delayed as a result of limited staffing during the pandemic.*  Note:  This document was prepared using Dragon voice recognition software and may include unintentional dictation errors.   Gregor Hams, MD 11/29/19 [phone]

## 2019-11-29 NOTE — ED Notes (Signed)
Pt assisted back to bed. Pt positioned on right side with comfort pillows behind back. Pt given call bell and all lights dimmed per pt request.   Pt was able to tolerate his breakfast.

## 2019-11-29 NOTE — Telephone Encounter (Signed)
[employer] still pending at this time. Will call back later to see if available to fax at that time.

## 2019-11-29 NOTE — ED Notes (Signed)
Pt reports improved breathing since placed on BIPAP

## 2019-11-29 NOTE — ED Notes (Signed)
Pt pl;aced on 4LPM [place] and bipap removed. Will assess how pt responds. Done by order of Dr. Owens Shark.

## 2019-11-29 NOTE — Evaluation (Signed)
Physical Therapy Evaluation Patient Details Name: Cody [name] MRN: [id number] DOB: 10/24/1955 Today's Date: 11/29/2019   History of Present Illness  presented to ER secondary to SOB; admitted for management of acute/chronic respiratory failure with hypoxia due to COPD, recently dignosed lung cancer (with mets to spine). Recent medical history significant for T1-T4 laminectomy with residual paraparesis (11/13/19)  Clinical Impression  Upon evaluation, patient alert and oriented; follows commands and demonstrates good effort with mobility tasks.  Generally weak and deconditioned due to acute illness (and post-op weakness, paraparesis s/p T1-4 laminectomy), but demonstrates strength and ROM generally functional for basic transfers and gait efforts. Currently requiring cga/close sup for bed mobility; min/mod assist for sit/stand, basic transfers and gait (5') with RW.  Demonstrates    forward flexed posture, mod reliance on RW; poor balance in A/P plane, requiring hands-on assist to prevent posterior LOB. Significant SOB with minimal exertion (desat to 86% on 2L, recovers >90% within 45 seconds); unable to tolerate additional activity at this time. Would benefit from skilled PT to address above deficits and promote optimal return to PLOF.; Recommend transition to HHPT upon discharge from acute hospitalization.    Follow Up Recommendations Home health PT    Equipment Recommendations       Recommendations for Other Services       Precautions / Restrictions Precautions Precautions: Fall Restrictions Weight Bearing Restrictions: No Other Position/Activity Restrictions: Figure 8 brace (from home) for OOB activities      Mobility  Bed Mobility Overal bed mobility: Needs Assistance Bed Mobility: Sit to Supine       Sit to supine: Supervision;Min guard      Transfers Overall transfer level: Needs assistance Equipment used: Rolling walker (2 wheeled) Transfers: Sit to/from Stand Sit  to Stand: Min assist Stand pivot transfers: Min assist;Mod assist       General transfer comment: slightly impulsive with transfer, attempting to sit priro to full alignment with bed surface  Ambulation/Gait Ambulation/Gait assistance: Min assist;Mod assist Gait Distance (Feet): 5 Feet Assistive device: Rolling walker (2 wheeled)       General Gait Details: forward flexed posture, mod reliance on RW; poor balance in A/P plane, requiring hands-on assist to prevent posterior LOB. Significant SOB with minimal exertion (desat to 86% on 2L, recovers >90% within 45 seconds); unable to tolerate additional activity at this time.  Stairs            Wheelchair Mobility    Modified Rankin (Stroke Patients Only)       Balance Overall balance assessment: Needs assistance Sitting-balance support: No upper extremity supported;Feet supported Sitting balance-Leahy Scale: Good     Standing balance support: Bilateral upper extremity supported Standing balance-Leahy Scale: Poor                               Pertinent Vitals/Pain Pain Assessment: No/denies pain    Home Living Family/patient expects to be discharged to:: Private residence Living Arrangements: Spouse/significant other Available Help at Discharge: Family;Available 24 hours/day Type of Home: House Home Access: Ramped entrance     Home Layout: One level Home Equipment: Walker - 2 wheels;Shower seat;Hand held shower head      Prior Function           Comments: At baseline, patient indep with ADLS, household and community mobilization without assis device; working full-time.  Endorses functional decline over previous 6-8 weeks with residual strength deficits s/p thoracic laminectomy.  Was actively participating with HHPT and OT, most recently ambulatory with RW and receiving assist from wife for ADLs, mobility as needed.  Denies fall history; no home O2.     Hand Dominance        Extremity/Trunk  Assessment   Upper Extremity Assessment Upper Extremity Assessment: Overall WFL for tasks assessed    Lower Extremity Assessment Lower Extremity Assessment: Generalized weakness(R LE at least 4/5, L LE at least 4-/5; endorses sensory deficit from upper thoracic spine distally (with impairments in sharp/dull, temp and general sensory awareness))       Communication   Communication: No difficulties  Cognition Arousal/Alertness: Awake/alert Behavior During Therapy: WFL for tasks assessed/performed Overall Cognitive Status: Within Functional Limits for tasks assessed                                        General Comments      Exercises Other Exercises Other Exercises: Educated in pursed lip breathing, signs/symptoms of fatigue and importance of acknowledging body's response to activity; patient voiced understanding.   Assessment/Plan    PT Assessment Patient needs continued PT services  PT Problem List Decreased strength;Decreased range of motion;Decreased activity tolerance;Decreased balance;Decreased mobility;Decreased coordination;Decreased knowledge of use of DME;Decreased safety awareness;Decreased knowledge of precautions;Cardiopulmonary status limiting activity       PT Treatment Interventions DME instruction;Therapeutic activities;Gait training;Therapeutic exercise;Stair training;Balance training;Functional mobility training;Neuromuscular re-education;Patient/family education    PT Goals (Current goals can be found in the Care Plan section)  Acute Rehab PT Goals Patient Stated Goal: to return home, to breathe better PT Goal Formulation: With patient Time For Goal Achievement: 12/13/19 Potential to Achieve Goals: Fair    Frequency Min 2X/week   Barriers to discharge        Co-evaluation               AM-PAC PT "6 Clicks" Mobility  Outcome Measure Help needed turning from your back to your side while in a flat bed without using bedrails?:  None Help needed moving from lying on your back to sitting on the side of a flat bed without using bedrails?: A Little Help needed moving to and from a bed to a chair (including a wheelchair)?: A Little Help needed standing up from a chair using your arms (e.g., wheelchair or bedside chair)?: A Little Help needed to walk in hospital room?: A Little Help needed climbing 3-5 steps with a railing? : A Lot 6 Click Score: 18    End of Session Equipment Utilized During Treatment: Gait belt;Oxygen Activity Tolerance: Patient limited by fatigue Patient left: in bed;with call bell/phone within reach;with bed alarm set Nurse Communication: Mobility status PT Visit Diagnosis: Muscle weakness (generalized) (M62.81);Difficulty in walking, not elsewhere classified (R26.2)    Time: 8115-7262 PT Time Calculation (min) (ACUTE ONLY): 16 min   Charges:   PT Evaluation $PT Eval Moderate Complexity: 1 Mod          Magaret Justo H. Owens Shark, PT, DPT, NCS 11/29/19, 10:32 PM [phone]

## 2019-11-29 NOTE — ED Notes (Signed)
Pt given meal tray.

## 2019-11-29 NOTE — ED Notes (Signed)
Lights dimmed for pt at this time, call light in reach

## 2019-11-29 NOTE — Progress Notes (Addendum)
Briefly patient is an unfortunate 64 year old man who was recently diagnosed with non-small cell lung CA who was admitted earlier today with increasing shortness of breath and hypoxia.  CT angiogram ruled out PE and just showed progression of metastatic disease.  Patient had previously been seen by his PCP earlier in the week and home oxygen had been ordered but had not yet been set up.  Patient denies fevers or chills.  No change in his baseline cough.  No abdominal pain chest pain nausea vomiting or diarrhea.  Patient is recently status post T1-T4 laminectomy at [facility] for spinal cord compression secondary to large metastatic mass.  He has no new lower extremity or back symptoms.  Patient is followed by Dr. Rogue Bussing for his cancer.  Has not started chemotherapy or palliative care as of yet but palliative radiation is planned as an outpatient.  TOC consult placed for home O2 upon discharge. I suspect he is most likely had progression of respiratory failure due to lung cancer superimposed on COPD rather than an acute intercurrent illness.  Patient agrees that he is just had slow progressive worsening of his DOE until "I could not stand it anymore yesterday".

## 2019-11-29 NOTE — ED Notes (Signed)
Dr. Damita Dunnings to bedside

## 2019-11-29 NOTE — ED Notes (Signed)
Pt transported to room 247 at this time by this RN.

## 2019-11-30 DIAGNOSIS — J9621 Acute and chronic respiratory failure with hypoxia: Secondary | ICD-10-CM | POA: Diagnosis not present

## 2019-11-30 DIAGNOSIS — J441 Chronic obstructive pulmonary disease with (acute) exacerbation: Secondary | ICD-10-CM | POA: Diagnosis not present

## 2019-11-30 DIAGNOSIS — J9601 Acute respiratory failure with hypoxia: Secondary | ICD-10-CM | POA: Diagnosis not present

## 2019-11-30 DIAGNOSIS — R0902 Hypoxemia: Secondary | ICD-10-CM | POA: Diagnosis present

## 2019-11-30 LAB — CBC
HCT: 33.2 % — ABNORMAL LOW (ref 39.0–52.0)
Hemoglobin: 10.7 g/dL — ABNORMAL LOW (ref 13.0–17.0)
MCH: 30.1 pg (ref 26.0–34.0)
MCHC: 32.2 g/dL (ref 30.0–36.0)
MCV: 93.5 fL (ref 80.0–100.0)
Platelets: 429 10*3/uL — ABNORMAL HIGH (ref 150–400)
RBC: 3.55 MIL/uL — ABNORMAL LOW (ref 4.22–5.81)
RDW: 15.8 % — ABNORMAL HIGH (ref 11.5–15.5)
WBC: 9.4 10*3/uL (ref 4.0–10.5)
nRBC: 0 % (ref 0.0–0.2)

## 2019-11-30 LAB — COMPREHENSIVE METABOLIC PANEL
ALT: 28 U/L (ref 0–44)
AST: 26 U/L (ref 15–41)
Albumin: 2.6 g/dL — ABNORMAL LOW (ref 3.5–5.0)
Alkaline Phosphatase: 197 U/L — ABNORMAL HIGH (ref 38–126)
Anion gap: 3 — ABNORMAL LOW (ref 5–15)
BUN: 15 mg/dL (ref 8–23)
CO2: 31 mmol/L (ref 22–32)
Calcium: 8.6 mg/dL — ABNORMAL LOW (ref 8.9–10.3)
Chloride: 98 mmol/L (ref 98–111)
Creatinine, Ser: 0.39 mg/dL — ABNORMAL LOW (ref 0.61–1.24)
GFR calc Af Amer: >60 mL/min (ref >60)
GFR calc non Af Amer: >60 mL/min (ref >60)
Glucose, Bld: 162 mg/dL — ABNORMAL HIGH (ref 70–99)
Potassium: 4.8 mmol/L (ref 3.5–5.1)
Sodium: 132 mmol/L — ABNORMAL LOW (ref 135–145)
Total Bilirubin: 0.6 mg/dL (ref 0.3–1.2)
Total Protein: 6.1 g/dL — ABNORMAL LOW (ref 6.5–8.1)

## 2019-11-30 MED ORDER — PREDNISONE 20 MG PO TABS: 40.0000 mg | ORAL_TABLET | Freq: Every day | ORAL | 0 refills | Status: AC

## 2019-11-30 MED ORDER — LORAZEPAM 0.5 MG PO TABS: 0.5000 mg | ORAL_TABLET | Freq: Three times a day (TID) | ORAL | 0 refills | Status: AC | PRN

## 2019-11-30 NOTE — Care Management Obs Status (Signed)
[place] NOTIFICATION   Patient Details  Name: ELIHU MILSTEIN MRN: [id number] Date of Birth: 1956-04-20   Medicare Observation Status Notification Given:  Yes    Shelbie Ammons, RN 11/30/2019, 2:38 PM

## 2019-11-30 NOTE — Progress Notes (Signed)
SATURATION QUALIFICATIONS: (This note is used to comply with regulatory documentation for home oxygen)  Patient Saturations on Room Air at Rest = 95%  Patient Saturations on Room Air while Ambulating = 85%  Patient Saturations on 2 Liters of oxygen while Ambulating = 95%  Please briefly explain why patient needs home oxygen: DOE

## 2019-11-30 NOTE — TOC Transition Note (Signed)
Transition of Care [facility]) - CM/SW Discharge Note   Patient Details  Name: Cody [name] MRN: [id number] Date of Birth: [birth year]  [facility] CM/SW Contact:  Victorino Dike, RN Phone Number: 11/30/2019, 2:16 PM   Clinical Narrative:     Patient to discharge home today with oxygen provided by [employer].  Will continue receiving home health services that are extended to all services:  PT, OT, RN, SW and aide.  No further TOC needs at this time, please re-consult for new needs.     Final next level of care: [place] Barriers to Discharge: Barriers Resolved   Patient Goals and CMS Choice Patient states their goals for this hospitalization and ongoing recovery are:: to go home. CMS Medicare.gov Compare Post Acute Care list provided to:: Patient    Discharge Placement                       Discharge Plan and Services                DME Arranged: Oxygen DME Agency: NA, [facility]([employer]) Date DME Agency Contacted: 11/30/19 Time DME Agency Contacted: [phone] Representative spoke with at DME Agency: Brenton Grills HH Arranged: PT, OT, Nurse's Aide, Social Work [employer] Agency: [facility] [place] Date [place]: 11/30/19 Time [place]: 1308 Representative spoke with at [place]: Time ([place]) Interventions     Readmission Risk Interventions No flowsheet data found.

## 2019-11-30 NOTE — Discharge Summary (Addendum)
Physician Discharge Summary  CHRISTOS MIXSON WUJ:[id number] DOB: 04-24-1956 DOA: 11/29/2019  PCP: Steele Sizer, MD  Admit date: 11/29/2019 Discharge date: 11/30/2019  Admitted From: Home Disposition: Home  Recommendations for Outpatient Follow-up:  1. Follow up with PCP in 1-2 weeks 2. Follow-up with oncology according to your schedule appointment. 3. Please obtain BMP/CBC in one week 4. Please follow up on the following pending results: None  Home Health: Yes Equipment/Devices: Home oxygen Discharge Condition: Fair CODE STATUS: Full Diet recommendation:  Regular   Brief/Interim Summary: Cody [name] is a 64 y.o. 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  Patient saw his PCP just a few days prior with shortness of breath believed related to his COPD.  On presentation he was desaturating up to 74% on room air.  Saturation improved with supplemental oxygen.  CTA was negative for PE.  He was initially treated with BiPAP and then weaned off to nasal cannula.  He will need oxygen up to 2 L at this time.  Most likely now oxygen dependent secondary to COPD and lung cancer.  He will continue his home bronchodilators and follow-up with his oncologist.  We arranged maximum home health services for him.  Discharge Diagnoses:  Principal Problem:   Acute and chronic respiratory failure with hypoxia (HCC) Active Problems:   Metastatic cancer to spine (HCC)   Non-small cell cancer of upper lobe of lung (HCC)   COPD with acute exacerbation ([place])   Hypoxia  Discharge Instructions  Discharge Instructions     Diet - low sodium heart healthy   Complete by: As directed    Discharge instructions   Complete by: As directed    It was pleasure taking care of you. Please continue using oxygen as directed and follow-up with your oncologist and primary care physician.   Increase activity slowly   Complete by: As directed      Allergies as of 11/30/2019   No Known Allergies     Medication List    TAKE these medications   acetaminophen 500 MG tablet Commonly known as: TYLENOL Take 500-1,000 mg by mouth every 6 (six) hours as needed for mild pain or fever.   Fluticasone-Umeclidin-Vilant 100-62.5-25 MCG/INH Aepb Inhale 1 puff into the lungs daily.   LORazepam 0.5 MG tablet Commonly known as: Ativan Take 1 tablet (0.5 mg total) by mouth every 8 (eight) hours as needed for up to 7 days for anxiety, sedation or sleep.   MULTIVITAMIN GUMMIES MENS PO Take 1 Dose by mouth daily.   predniSONE 20 MG tablet Commonly known as: DELTASONE Take 2 tablets (40 mg total) by mouth daily with breakfast for 4 days. Start taking on: Dec 01, 2019            Durable Medical Equipment  (From admission, onward)         Start     Ordered   11/30/19 1048  For home use only DME oxygen  Once    Comments: And has metastatic lung cancer and will need oxygen all the time now.  Question Answer Comment  Length of Need Lifetime  Mode or (Route) Nasal cannula   Liters per Minute 2   Frequency Continuous (stationary and portable oxygen unit needed)   Oxygen conserving device Yes   Oxygen delivery system Gas      11/30/19 1048          No Known Allergies  Consultations:  None  Procedures/Studies: CT Chest W Contrast  Result Date: 10/31/2019 CLINICAL DATA:  History of trauma with subsequent upper back pain. EXAM: CT CHEST, ABDOMEN, AND PELVIS WITH CONTRAST TECHNIQUE: Multidetector CT imaging of the chest, abdomen and pelvis was performed following the standard protocol during bolus administration of  intravenous contrast. CONTRAST:  60mL OMNIPAQUE IOHEXOL 300 MG/ML  SOLN COMPARISON:  May 26, 2017 FINDINGS: CT CHEST FINDINGS Cardiovascular: There is moderate severity calcification of the aortic arch. Normal heart size. No pericardial effusion. Moderate to marked severity coronary artery calcification is seen. Mediastinum/Nodes: No enlarged mediastinal, hilar, or axillary lymph nodes. Thyroid gland, trachea, and esophagus demonstrate no significant findings. Lungs/Pleura: There is marked severity emphysematous lung disease. There is stable moderate to marked severity right apical scarring and/or atelectasis. A predominant stable 2.2 cm x 1.7 cm irregular appearing area of low attenuation is seen within the anterior aspect of the right upper lobe (axial CT image 38, CT series number 4). A stable, similar appearing 1.4 cm x 0.9 cm area is noted within the posterior aspect of the right upper lobe (axial CT image 30, CT series number 4). A new 5.6 cm x 3.1 cm heterogeneous low-attenuation soft tissue mass is seen within the posterior aspect of the right upper lobe. This extends into the adjacent pleura and chest wall with subsequent cortical destruction of the posterior aspect of the fourth right rib. A 4.8 cm x 5.2 cm heterogeneous low-attenuation soft tissue mass is seen along the posteromedial aspect of the right upper lobe with extension to involve the adjacent pleura and chest wall. Cortical destruction of the posteromedial aspect of the third right rib is seen with cortical destruction of the T3 and T4 vertebral bodies also noted. This soft tissue mass extends into the spinal canal with subsequent marked severity spinal canal stenosis and spinal cord impingement. Musculoskeletal: Areas of cortical destruction, as described above, involving the posteromedial aspect of the third right rib, posterior aspect of the fourth right rib, T3 vertebral body and T4 vertebral body. A pathologic compression fracture  deformity of the T3 vertebral body is also seen with mild posterior retropulsion of fracture fragments. CT ABDOMEN PELVIS FINDINGS Hepatobiliary: Multiple heterogeneous low-attenuation liver lesions of various sizes are seen scattered through the right and left lobes. No gallstones, gallbladder wall thickening, or biliary dilatation. Pancreas: Unremarkable. No pancreatic ductal dilatation or surrounding inflammatory changes. Spleen: Normal in size without focal abnormality. Adrenals/Urinary Tract: Adrenal glands are unremarkable. Kidneys are normal in size, without renal calculi or hydronephrosis. Focal cortical scarring is seen along the posterior aspect of the mid left kidney. Bladder is unremarkable. Stomach/Bowel: Stomach is within normal limits. The appendix is not clearly identified. No evidence of bowel wall thickening, distention, or inflammatory changes. Vascular/Lymphatic: Marked severity aortic calcification. No enlarged abdominal or pelvic lymph nodes. Reproductive: The prostate gland is moderately enlarged Other: Multiple surgical clips are seen along the right inguinal region. Musculoskeletal: Multilevel degenerative changes seen throughout the lumbar spine. IMPRESSION: 1. Heterogeneous low-attenuation soft tissue masses within the posterior and posteromedial aspects of the right upper lobe, with extension to the adjacent pleura and chest wall, and associated cortical destruction of the posterior  aspect of the third right rib, posterior fourth right rib, T3 and T4 vertebral bodies. Extension into the spinal canal with subsequent marked severity narrowing and spinal cord impingement is seen. MRI correlation is recommended. 2. Pathologic compression fracture deformity of the T3 vertebral body which likely represents sequelae from the patient's recent upper back trauma. 3. Stable, irregular appearing areas of low attenuation within the anterior and posterior aspects of the right upper lobe, which may  represent areas of scarring, however malignancy cannot be excluded. 4. Multiple heterogeneous low-attenuation liver lesions of various sizes, consistent with metastatic disease. 5. Stable moderate to marked severity right apical scarring and/or atelectasis. 6. Advanced coronary artery calcification. 7. Moderate to marked severity aortic calcification. 8. Enlarged prostate gland. 9. Degenerative changes of the lumbar spine. Aortic Atherosclerosis (ICD10-I70.0) and Emphysema (ICD10-J43.9). Electronically Signed   By: Virgina Norfolk M.D.   On: 10/31/2019 21:11   CT Angio Chest PE W and/or Wo Contrast  Result Date: 11/29/2019 CLINICAL DATA:  Shortness of breath and dyspnea, known lung cancer, recent spinal survey EXAM: CT ANGIOGRAPHY CHEST WITH CONTRAST TECHNIQUE: Multidetector CT imaging of the chest was performed using the standard protocol during bolus administration of intravenous contrast. Multiplanar CT image reconstructions and MIPs were obtained to evaluate the vascular anatomy. CONTRAST:  4mL OMNIPAQUE IOHEXOL 350 MG/ML SOLN COMPARISON:  CT 10/31/2019, radiograph 11/29/2019 FINDINGS: Cardiovascular: Satisfactory opacification the pulmonary arteries to the segmental level. No pulmonary artery filling defects are identified. Central pulmonary arteries are normal caliber. Normal cardiac size. No pericardial effusion. Few coronary artery calcifications are present. Calcific plaque is present within the normal caliber thoracic aorta. Normal 3 vessel branching of the aortic arch with minimal calcification of the proximal great vessels. Mediastinum/Nodes: No mediastinal fluid or gas. Normal thyroid gland and thoracic inlet. No acute abnormality of the trachea or esophagus. No worrisome mediastinal, hilar or axillary adenopathy. Lungs/Pleura: Severe centrilobular and paraseptal emphysematous changes. Multiple right upper lobe pulmonary masses are redemonstrated is include a 2.2 x 1.8 cm spiculated region of low  attenuation in the anterior apex (6/22). A smaller 1.3 by 0.9 cm focus is seen in the posterior apex (6/15). There is a more ill-defined heterogeneous region of soft tissue density extending across the posteromedial aspect of the right upper lobe and apex with extension into the chest chest wall with associated bony destruction the T3 and T4 vertebral bodies and third rib posteriorly. This is grossly similar in size and appearance to the comparison exam though difficult to fully assess given the phase of contrast timing and extensive streak artifact from patient's newly placed thoracic spine hardware which limits of soft tissue and bone resolution. A more smoothly marginated extension possibly a separate mass further extends laterally with destruction fourth rib in involvement of the chest wall similar to prior exam as well. Upper Abdomen: Redemonstration of the numerous peripherally enhancing lesions seen throughout the right and left lobe liver, largest in the right lobe measuring to 2.7 cm in size. No other acute upper abdominal abnormality. Musculoskeletal: Interval placement a T1-T6 posterior thoracic spinal fusion bridging the destructive pathologic fractures of the T3 and T4 vertebral bodies with transpedicular screws at the T1, T2, T5 and T6 levels. Paired posterior struts are noted on chest radiography. No acute hardware complication is seen. Hardware does result in significant streak artifact which limits evaluation of osseous structures and adjacent soft tissues. Destructive changes of the right chest wall and third and fourth ribs, as detailed above. Extension into the canal  is difficult to assess on this examination. Review of the MIP images confirms the above findings. IMPRESSION: 1. No evidence of acute pulmonary embolism. 2. Redemonstration of multiple right upper lobe pulmonary masses with associated bony destruction of the T3 and T4 vertebral bodies and third and fourth ribs posteriorly as well as  gross invasion of the posterior chest wall. The degree of extension into the spinal canal is less well visualized on this study due to streak from interval placement of a T1-T6 spinal fusion. 3. Multiple peripherally enhancing lesions seen throughout the right and left lobe liver, largest in the right lobe measuring to 2.7 cm in size, compatible with metastatic disease. 4. No new pulmonary nodules or masses. No acute superimposed intrathoracic process. 5. Aortic Atherosclerosis (ICD10-I70.0) 6. Emphysema (ICD10-J43.9). Electronically Signed   By: Lovena Le M.D.   On: 11/29/2019 03:56   MR Cervical Spine Wo Contrast  Result Date: 10/31/2019 CLINICAL DATA:  64 year old male with recent upper back pain thought related to work injury. Increasing numbness in both legs since last week. EXAM: MRI CERVICAL SPINE WITHOUT CONTRAST TECHNIQUE: Multiplanar, multisequence MR imaging of the cervical spine was performed. No intravenous contrast was administered. COMPARISON:  Thoracic spine MRI today reported separately. FINDINGS: Alignment: Relatively preserved cervical lordosis. No spondylolisthesis. Vertebrae: No convincing cervical vertebral marrow edema or marrow lesion. However, there is a large right apical lung and/or upper thoracic paravertebral mass partially visible (series 19 image 7) with abnormal marrow signal in the T1 and T2 levels. See comparison. Cord: Normal cervical spinal cord. See thoracic spinal cord details reported separately. Posterior Fossa, vertebral arteries, paraspinal tissues: Partially visible large right thoracic inlet soft tissue mass. See thoracic spine MRI. Preserved major vascular flow voids in the neck, the left vertebral artery appears dominant. No cervical lymphadenopathy is evident. Cervicomedullary junction is within normal limits. Grossly negative visible brain parenchyma. Disc levels: Cervical spine degeneration most notable for: C4-C5: Small central disc protrusion with moderate  facet hypertrophy and endplate spurring greater on the right. Stenosis there is moderate right C5 foraminal. C5-C6: Disc bulge and endplate spurring with mild to moderate facet hypertrophy. Mild ligament flavum hypertrophy. No significant spinal stenosis. Moderate to severe left and mild-to-moderate right C6 foraminal stenosis. C6-C7: Circumferential disc osteophyte complex. Broad-based posterior component with mild to moderate facet and ligament flavum hypertrophy. Borderline to mild spinal stenosis. Severe left greater than right C7 foraminal stenosis. IMPRESSION: 1. Partially visible large right thoracic inlet and/or paravertebral soft tissue tumor. 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 See Thoracic Spine MRI reported separately. 2. No involvement of the cervical spine or spinal cord. 3. There is lower cervical spine degeneration resulting in up to mild spinal stenosis, and moderate/severe foraminal stenosis at the right C5, left C6 and bilateral C7 nerve levels. Electronically Signed   By: Genevie Ann M.D.   On: 10/31/2019 18:49   MR THORACIC SPINE WO CONTRAST  Result Date: 10/31/2019 CLINICAL DATA:  64 year old male with recent upper back pain thought related to work injury. Increasing numbness in both legs since last week. EXAM: MRI THORACIC SPINE WITHOUT CONTRAST TECHNIQUE: Multiplanar, multisequence MR imaging of the thoracic spine was performed. No intravenous contrast was administered. COMPARISON:  Cervical spine MRI today reported separately. Chest CT 05/26/2017. FINDINGS: Alignment: Normal cervicothoracic junction alignment. Mildly exaggerated upper thoracic kyphosis in conjunction with the pathologic findings below. Subtle anterolisthesis of T2 on T3. Vertebrae: Pathologic compression fracture of T3 with vertebra plana. Abnormal marrow signal indicative  of vertebral tumor and/or marrow edema also throughout the T2, T4, and right T5 vertebrae. Involvement of the posterior ribs at those levels,  and additional bulky tumor (up to nearly 3 cm diameter) tracking along the skin right posterolateral 4th rib (series 20, image 13). Tumor invading and destroying the upper thoracic spine epicenter at T3 encompasses up to 7.4 cm diameter. See series 20, image 9 and series 16, image 5. Epidural invasion, foraminal invasion, and cord compression at that level (see below). The right T2 through T4 neural foramina are obliterated. There is also tumor in the left T3 foramen. The T1 vertebra is relatively spared at this time although there is tumor in the T1-T2 interspinous space. The T6 through T12 vertebrae appear spared. Cord: Tumor throughout the spinal canal from the T2 to the T4 level with cord compression maximal at T3 (series 20, image 9) where the cord is compressed to about 3-4 mm AP. There is subtle associated abnormal cord signal. Below the mid T4 level thoracic cord signal and morphology is normal. Normal conus at T12-L1. Paraspinal and other soft tissues: Bulky right paraspinal tumor in the upper thoracic spine and posterior right 4th rib space as detailed above. There is superimposed chronic right apical lung disease, and there might be additional tumor in the anterior right lung periphery as seen on series 20, image 6. Disc levels: Malignant findings in the upper thoracic spine as stated above. No superimposed age advanced thoracic spine degeneration. IMPRESSION: 1. T3 level right paraspinal bulky and destructive soft tissue tumor, 7.4 cm diameter. T3 pathologic fracture with vertebra plana. Widespread epidural tumor extension with SPINAL CORD COMPRESSION and mild cord edema maximal at T3. Tumor obliterating the bilateral T3 and right T2 and T4 neural foramina. Bulky tumor also growing laterally along the right 4th rib. 2. Top differential considerations are Lung Cancer and Plasmacytoma. Lymphoma is also considered but felt less likely. Critical Value/emergent results were called by telephone at the time of  interpretation on 10/31/2019 at 6:58 pm to Dr. Delman Kitten , who verbally acknowledged these results. Electronically Signed   By: Genevie Ann M.D.   On: 10/31/2019 19:03   MR LUMBAR SPINE WO CONTRAST  Result Date: 10/31/2019 CLINICAL DATA:  64 year old male with recent upper back pain thought related to work injury. Increasing numbness in both legs since last week. EXAM: MRI LUMBAR SPINE WITHOUT CONTRAST TECHNIQUE: Multiplanar, multisequence MR imaging of the lumbar spine was performed. No intravenous contrast was administered. COMPARISON:  Thoracic spine MRI today reported separately. FINDINGS: Segmentation:  Lumbar segmentation appears to be normal. Alignment:  Relatively preserved lumbar lordosis. Vertebrae: Visualized bone marrow signal is within normal limits. No lumbar or visible sacral marrow edema or suspicious marrow lesion. Conus medullaris and cauda equina: Conus extends to the T12-L1 level. No lower spinal cord or conus signal abnormality. Normal noncontrast appearance of the cauda equina nerve roots. Paraspinal and other soft tissues: Visualized noncontrast abdominal viscera and paraspinal soft tissues are within normal limits. Disc levels: No significant lumbar spinal stenosis. There is chronic disc and endplate degeneration O6-Z1 through L4-L5 with some superimposed lumbar facet hypertrophy. IMPRESSION: 1. No acute or metastatic process identified in the lumbar spine. See Thoracic Spine MRI today reported separately. 2. Lumbar spine degeneration without significant spinal stenosis. Electronically Signed   By: Genevie Ann M.D.   On: 10/31/2019 19:13   CT ABDOMEN PELVIS W CONTRAST  Result Date: 10/31/2019 CLINICAL DATA:  History of trauma with subsequent upper back pain.  EXAM: CT CHEST, ABDOMEN, AND PELVIS WITH CONTRAST TECHNIQUE: Multidetector CT imaging of the chest, abdomen and pelvis was performed following the standard protocol during bolus administration of intravenous contrast. CONTRAST:  79mL  OMNIPAQUE IOHEXOL 300 MG/ML  SOLN COMPARISON:  None. FINDINGS: CT CHEST FINDINGS Cardiovascular: There is moderate severity calcification of the aortic arch. Normal heart size. No pericardial effusion. Moderate to marked severity coronary artery calcification is seen. Mediastinum/Nodes: No enlarged mediastinal, hilar, or axillary lymph nodes. Thyroid gland, trachea, and esophagus demonstrate no significant findings. Lungs/Pleura: There is marked severity emphysematous lung disease. There is stable moderate to marked severity right apical scarring and/or atelectasis. A predominant stable 2.2 cm x 1.7 cm irregular appearing area of low attenuation is seen within the anterior aspect of the right upper lobe (axial CT image 38, CT series number 4). A stable, similar appearing 1.4 cm x 0.9 cm area is noted within the posterior aspect of the right upper lobe (axial CT image 30, CT series number 4). A new 5.6 cm x 3.1 cm heterogeneous low-attenuation soft tissue mass is seen within the posterior aspect of the right upper lobe. This extends into the adjacent pleura and chest wall with subsequent cortical destruction of the posterior aspect of the fourth right rib. A 4.8 cm x 5.2 cm heterogeneous low-attenuation soft tissue mass is seen along the posteromedial aspect of the right upper lobe with extension to involve the adjacent pleura and chest wall. Cortical destruction of the posteromedial aspect of the third right rib is seen with cortical destruction of the T3 and T4 vertebral bodies also noted. This soft tissue mass extends into the spinal canal with subsequent marked severity spinal canal stenosis and spinal cord impingement. Musculoskeletal: Areas of cortical destruction, as described above, involving the posteromedial aspect of the third right rib, posterior aspect of the fourth right rib, T3 vertebral body and T4 vertebral body. A pathologic compression fracture deformity of the T3 vertebral body is also seen with  mild posterior retropulsion of fracture fragments. CT ABDOMEN PELVIS FINDINGS Hepatobiliary: Multiple heterogeneous low-attenuation liver lesions of various sizes are seen scattered through the right and left lobes. No gallstones, gallbladder wall thickening, or biliary dilatation. Pancreas: Unremarkable. No pancreatic ductal dilatation or surrounding inflammatory changes. Spleen: Normal in size without focal abnormality. Adrenals/Urinary Tract: Adrenal glands are unremarkable. Kidneys are normal in size, without renal calculi or hydronephrosis. Focal cortical scarring is seen along the posterior aspect of the mid left kidney. Bladder is unremarkable. Stomach/Bowel: Stomach is within normal limits. The appendix is not clearly identified. No evidence of bowel wall thickening, distention, or inflammatory changes. Vascular/Lymphatic: Marked severity aortic calcification. No enlarged abdominal or pelvic lymph nodes. Reproductive: The prostate gland is moderately enlarged Other: Multiple surgical clips are seen along the right inguinal region. Musculoskeletal: Multilevel degenerative changes seen throughout the lumbar spine. IMPRESSION: 1. 1. Heterogeneous low-attenuation soft tissue masses within the posterior and posteromedial aspects of the right upper lobe, with extension to the adjacent pleura and chest wall, and associated cortical destruction of the posterior aspect of the third right rib, posterior fourth right rib, T3 and T4 vertebral bodies. Extension into the spinal canal with subsequent marked severity narrowing and spinal cord impingement is seen. MRI correlation is recommended. 2. Pathologic compression fracture deformity of the T3 vertebral body which likely represents sequelae from the patient's recent upper back trauma. 3. Stable, irregular appearing areas of low attenuation within the anterior and posterior aspects of the right upper lobe, which may represent  areas of scarring, however malignancy cannot  be excluded. 4. Multiple heterogeneous low-attenuation liver lesions of various sizes, consistent with metastatic disease. 5. Stable moderate to marked severity right apical scarring and/or atelectasis. 6. Advanced coronary artery calcification. 7. Moderate to marked severity aortic calcification. 8. Enlarged prostate gland. 9. Degenerative changes of the lumbar spine. Aortic Atherosclerosis (ICD10-I70.0) and Emphysema (ICD10-J43.9). Electronically Signed   By: Virgina Norfolk M.D.   On: 10/31/2019 21:17   DG Chest Portable 1 View  Result Date: 11/29/2019 CLINICAL DATA:  Shortness of breath for 2-3 days, history of lung cancer EXAM: PORTABLE CHEST 1 VIEW COMPARISON:  CT 10/31/2019, radiograph 05/08/2017 FINDINGS: Redemonstration of the spiculated mass in the right lung apex with associated destructive changes of the posterior right third rib and upper thoracic vertebrae bridged by the vertebral fusion rods. Findings on a background of severe centrilobular and paraseptal emphysematous changes better demonstrated on comparison cross-sectional imaging. Chronic bronchitic features are present as well. No new consolidative opacity. No convincing features of pulmonary edema. Cardiomediastinal contours are stable. IMPRESSION: 1. Redemonstrated masses towards the right lung apex with associated destructive changes of the posterior right third rib and upper thoracic vertebrae bridged by hardware. 2. No acute cardiopulmonary abnormality. 3. Chronic bronchitic and emphysematous changes. Electronically Signed   By: Lovena Le M.D.   On: 11/29/2019 03:16    Subjective: Patient was feeling better when seen today.  He really think that oxygen is helping and that is what he needs it.  Discharge Exam: Vitals:   11/30/19 1146 11/30/19 1354  BP: 119/73   Pulse: 80   Resp: 19   Temp: 97.9 F (36.6 C)   SpO2: 100% 97%   Vitals:   11/30/19 0759 11/30/19 0812 11/30/19 1146 11/30/19 1354  BP:  117/74 119/73    Pulse:  88 80   Resp:  16 19   Temp:  (!) 97.5 F (36.4 C) 97.9 F (36.6 C)   TempSrc:  Oral Oral   SpO2: 97% 99% 100% 97%  Weight:      Height:        General: Pt is alert, awake, not in acute distress Cardiovascular: RRR, S1/S2 +, no rubs, no gallops Respiratory: CTA bilaterally, no wheezing, no rhonchi Abdominal: Soft, NT, ND, bowel sounds + Extremities: no edema, no cyanosis   The results of significant diagnostics from this hospitalization (including imaging, microbiology, ancillary and laboratory) are listed below for reference.    Microbiology: Recent Results (from the past 240 hour(s))  Respiratory Panel by RT PCR (Flu A&B, Covid) - Nasopharyngeal Swab     Status: None   Collection Time: 11/29/19  2:59 AM   Specimen: Nasopharyngeal Swab  Result Value Ref Range Status   SARS Coronavirus 2 by RT PCR NEGATIVE NEGATIVE Final    Comment: (NOTE) SARS-CoV-2 target nucleic acids are NOT DETECTED. The SARS-CoV-2 RNA is generally detectable in upper respiratoy specimens during the acute phase of infection. The lowest concentration of SARS-CoV-2 viral copies this assay can detect is 131 copies/mL. A negative result does not preclude SARS-Cov-2 infection and should not be used as the sole basis for treatment or other patient management decisions. A negative result may occur with  improper specimen collection/handling, submission of specimen other than nasopharyngeal swab, presence of viral mutation(s) within the areas targeted by this assay, and inadequate number of viral copies (<131 copies/mL). A negative result must be combined with clinical observations, patient history, and epidemiological information. The expected result is Negative. Fact  Sheet for Patients:  PinkCheek.be Fact Sheet for Healthcare Providers:  GravelBags.it This test is not yet ap proved or cleared by the [place] FDA and  has been  authorized for detection and/or diagnosis of SARS-CoV-2 by FDA under an Emergency Use Authorization (EUA). This EUA will remain  in effect (meaning this test can be used) for the duration of the COVID-19 declaration under Section 564(b)(1) of the Act, 21 U.S.C. section 360bbb-3(b)(1), unless the authorization is terminated or revoked sooner.    Influenza A by PCR NEGATIVE NEGATIVE Final   Influenza B by PCR NEGATIVE NEGATIVE Final    Comment: (NOTE) The Xpert Xpress SARS-CoV-2/FLU/RSV assay is intended as an aid in  the diagnosis of influenza from Nasopharyngeal swab specimens and  should not be used as a sole basis for treatment. Nasal washings and  aspirates are unacceptable for Xpert Xpress SARS-CoV-2/FLU/RSV  testing. Fact Sheet for Patients: PinkCheek.be Fact Sheet for Healthcare Providers: GravelBags.it This test is not yet approved or cleared by the [place] FDA and  has been authorized for detection and/or diagnosis of SARS-CoV-2 by  FDA under an Emergency Use Authorization (EUA). This EUA will remain  in effect (meaning this test can be used) for the duration of the  Covid-19 declaration under Section 564(b)(1) of the Act, 21  U.S.C. section 360bbb-3(b)(1), unless the authorization is  terminated or revoked. Performed at [facility], [address] [place], [place] [address]      Labs: BNP (last 3 results) Recent Labs    11/29/19 0247  BNP 60.4   Basic Metabolic Panel: Recent Labs  Lab 11/29/19 0247 11/30/19 0600  NA 134* 132*  K 4.1 4.8  CL 97* 98  CO2 27 31  GLUCOSE 108* 162*  BUN 18 15  CREATININE 0.62 0.39*  CALCIUM 8.8* 8.6*   Liver Function Tests: Recent Labs  Lab 11/29/19 0247 11/30/19 0600  AST 42* 26  ALT 35 28  ALKPHOS 266* 197*  BILITOT 0.7 0.6  PROT 7.4 6.1*  ALBUMIN 3.3* 2.6*   No results for input(s): LIPASE, AMYLASE in the last 168 hours. No results for  input(s): AMMONIA in the last 168 hours. CBC: Recent Labs  Lab 11/29/19 0247 11/30/19 0600  WBC 8.1 9.4  HGB 12.3* 10.7*  HCT 38.1* 33.2*  MCV 92.3 93.5  PLT 434* 429*   Cardiac Enzymes: No results for input(s): CKTOTAL, CKMB, CKMBINDEX, TROPONINI in the last 168 hours. BNP: Invalid input(s): POCBNP CBG: No results for input(s): GLUCAP in the last 168 hours. D-Dimer No results for input(s): DDIMER in the last 72 hours. Hgb A1c No results for input(s): HGBA1C in the last 72 hours. Lipid Profile No results for input(s): CHOL, HDL, LDLCALC, TRIG, CHOLHDL, LDLDIRECT in the last 72 hours. Thyroid function studies No results for input(s): TSH, T4TOTAL, T3FREE, THYROIDAB in the last 72 hours.  Invalid input(s): FREET3 Anemia work up No results for input(s): VITAMINB12, FOLATE, FERRITIN, TIBC, IRON, RETICCTPCT in the last 72 hours. Urinalysis No results found for: COLORURINE, APPEARANCEUR, [place], [place], [place], [place], [place], [place], PROTEINUR, UROBILINOGEN, NITRITE, LEUKOCYTESUR Sepsis Labs Invalid input(s): PROCALCITONIN,  WBC,  LACTICIDVEN Microbiology Recent Results (from the past 240 hour(s))  Respiratory Panel by RT PCR (Flu A&B, Covid) - Nasopharyngeal Swab     Status: None   Collection Time: 11/29/19  2:59 AM   Specimen: Nasopharyngeal Swab  Result Value Ref Range Status   SARS Coronavirus 2 by RT PCR NEGATIVE NEGATIVE Final    Comment: (  NOTE) SARS-CoV-2 target nucleic acids are NOT DETECTED. The SARS-CoV-2 RNA is generally detectable in upper respiratoy specimens during the acute phase of infection. The lowest concentration of SARS-CoV-2 viral copies this assay can detect is 131 copies/mL. A negative result does not preclude SARS-Cov-2 infection and should not be used as the sole basis for treatment or other patient management decisions. A negative result may occur with  improper specimen collection/handling, submission of specimen other than  nasopharyngeal swab, presence of viral mutation(s) within the areas targeted by this assay, and inadequate number of viral copies (<131 copies/mL). A negative result must be combined with clinical observations, patient history, and epidemiological information. The expected result is Negative. Fact Sheet for Patients:  PinkCheek.be Fact Sheet for Healthcare Providers:  GravelBags.it This test is not yet ap proved or cleared by the [place] FDA and  has been authorized for detection and/or diagnosis of SARS-CoV-2 by FDA under an Emergency Use Authorization (EUA). This EUA will remain  in effect (meaning this test can be used) for the duration of the COVID-19 declaration under Section 564(b)(1) of the Act, 21 U.S.C. section 360bbb-3(b)(1), unless the authorization is terminated or revoked sooner.    Influenza A by PCR NEGATIVE NEGATIVE Final   Influenza B by PCR NEGATIVE NEGATIVE Final    Comment: (NOTE) The Xpert Xpress SARS-CoV-2/FLU/RSV assay is intended as an aid in  the diagnosis of influenza from Nasopharyngeal swab specimens and  should not be used as a sole basis for treatment. Nasal washings and  aspirates are unacceptable for Xpert Xpress SARS-CoV-2/FLU/RSV  testing. Fact Sheet for Patients: PinkCheek.be Fact Sheet for Healthcare Providers: GravelBags.it This test is not yet approved or cleared by the [place] FDA and  has been authorized for detection and/or diagnosis of SARS-CoV-2 by  FDA under an Emergency Use Authorization (EUA). This EUA will remain  in effect (meaning this test can be used) for the duration of the  Covid-19 declaration under Section 564(b)(1) of the Act, 21  U.S.C. section 360bbb-3(b)(1), unless the authorization is  terminated or revoked. Performed at [facility], [address] [place], [place] [address]      Time coordinating discharge: Over 30 minutes  SIGNED:  Lorella Nimrod, MD  [facility] 11/30/2019, 5:14 PM  If 7PM-7AM, please contact night-coverage [email or web address]  This record has been created using Systems analyst. Errors have been sought and corrected,but may not always be located. Such creation errors do not reflect on the standard of care.

## 2019-11-30 NOTE — Progress Notes (Signed)
Pt ambulated to room door and back on room air. Pt's oxygen quickly went down to 85% with ambulation. Oxygen applied via [place] at 2L. Oxygen sat returned to 95% with activity. I will continue to assess.

## 2019-11-30 NOTE — Care Management CC44 (Signed)
Condition Code 44 Documentation Completed  Patient Details  Name: Cody [name] MRN: [id number] Date of Birth: 03-Sep-1955   Condition Code 44 given:  Yes Patient signature on Condition Code 44 notice:  Yes Documentation of 2 MD's agreement:  Yes Code 44 added to claim:  Yes    Shelbie Ammons, RN 11/30/2019, 2:39 PM

## 2019-12-02 DIAGNOSIS — M47812 Spondylosis without myelopathy or radiculopathy, cervical region: Secondary | ICD-10-CM | POA: Diagnosis not present

## 2019-12-02 DIAGNOSIS — R69 Illness, unspecified: Secondary | ICD-10-CM | POA: Diagnosis not present

## 2019-12-02 DIAGNOSIS — M8458XD Pathological fracture in neoplastic disease, other specified site, subsequent encounter for fracture with routine healing: Secondary | ICD-10-CM | POA: Diagnosis not present

## 2019-12-02 DIAGNOSIS — C7951 Secondary malignant neoplasm of bone: Secondary | ICD-10-CM | POA: Diagnosis not present

## 2019-12-02 DIAGNOSIS — M47816 Spondylosis without myelopathy or radiculopathy, lumbar region: Secondary | ICD-10-CM | POA: Diagnosis not present

## 2019-12-02 DIAGNOSIS — Z483 Aftercare following surgery for neoplasm: Secondary | ICD-10-CM | POA: Diagnosis not present

## 2019-12-02 DIAGNOSIS — J449 Chronic obstructive pulmonary disease, unspecified: Secondary | ICD-10-CM | POA: Diagnosis not present

## 2019-12-02 DIAGNOSIS — N400 Enlarged prostate without lower urinary tract symptoms: Secondary | ICD-10-CM | POA: Diagnosis not present

## 2019-12-02 DIAGNOSIS — M4802 Spinal stenosis, cervical region: Secondary | ICD-10-CM | POA: Diagnosis not present

## 2019-12-02 DIAGNOSIS — C801 Malignant (primary) neoplasm, unspecified: Secondary | ICD-10-CM | POA: Diagnosis not present

## 2019-12-05 ENCOUNTER — Inpatient Hospital Stay: Payer: Medicare HMO | Attending: Radiation Oncology

## 2019-12-05 ENCOUNTER — Encounter: Payer: Self-pay | Admitting: *Deleted

## 2019-12-05 ENCOUNTER — Ambulatory Visit
Admission: RE | Admit: 2019-12-05 | Discharge: 2019-12-05 | Disposition: A | Discharge status: Home / Self Care | Payer: Medicare HMO | Source: Ambulatory Visit | Attending: Radiation Oncology | Admitting: Radiation Oncology

## 2019-12-05 ENCOUNTER — Encounter: Payer: Self-pay | Admitting: Radiation Oncology

## 2019-12-05 ENCOUNTER — Other Ambulatory Visit: Payer: Self-pay

## 2019-12-05 ENCOUNTER — Telehealth: Payer: Self-pay | Admitting: Family Medicine

## 2019-12-05 VITALS — BP 108/67 | HR 85 | Temp 98.0°F | Resp 16

## 2019-12-05 DIAGNOSIS — Z79899 Other long term (current) drug therapy: Secondary | ICD-10-CM | POA: Insufficient documentation

## 2019-12-05 DIAGNOSIS — N400 Enlarged prostate without lower urinary tract symptoms: Secondary | ICD-10-CM | POA: Insufficient documentation

## 2019-12-05 DIAGNOSIS — Z9981 Dependence on supplemental oxygen: Secondary | ICD-10-CM | POA: Insufficient documentation

## 2019-12-05 DIAGNOSIS — C7951 Secondary malignant neoplasm of bone: Secondary | ICD-10-CM

## 2019-12-05 DIAGNOSIS — Z87891 Personal history of nicotine dependence: Secondary | ICD-10-CM | POA: Diagnosis not present

## 2019-12-05 DIAGNOSIS — Z7289 Other problems related to lifestyle: Secondary | ICD-10-CM | POA: Insufficient documentation

## 2019-12-05 DIAGNOSIS — I2510 Atherosclerotic heart disease of native coronary artery without angina pectoris: Secondary | ICD-10-CM | POA: Insufficient documentation

## 2019-12-05 DIAGNOSIS — G629 Polyneuropathy, unspecified: Secondary | ICD-10-CM | POA: Insufficient documentation

## 2019-12-05 DIAGNOSIS — I700 Atherosclerosis of aorta: Secondary | ICD-10-CM | POA: Insufficient documentation

## 2019-12-05 DIAGNOSIS — C3411 Malignant neoplasm of upper lobe, right bronchus or lung: Secondary | ICD-10-CM | POA: Diagnosis not present

## 2019-12-05 DIAGNOSIS — J449 Chronic obstructive pulmonary disease, unspecified: Secondary | ICD-10-CM | POA: Insufficient documentation

## 2019-12-05 DIAGNOSIS — R64 Cachexia: Secondary | ICD-10-CM | POA: Insufficient documentation

## 2019-12-05 DIAGNOSIS — C787 Secondary malignant neoplasm of liver and intrahepatic bile duct: Secondary | ICD-10-CM | POA: Insufficient documentation

## 2019-12-05 DIAGNOSIS — Z5112 Encounter for antineoplastic immunotherapy: Secondary | ICD-10-CM | POA: Insufficient documentation

## 2019-12-05 DIAGNOSIS — M2550 Pain in unspecified joint: Secondary | ICD-10-CM | POA: Insufficient documentation

## 2019-12-05 DIAGNOSIS — J90 Pleural effusion, not elsewhere classified: Secondary | ICD-10-CM | POA: Insufficient documentation

## 2019-12-05 DIAGNOSIS — Z801 Family history of malignant neoplasm of trachea, bronchus and lung: Secondary | ICD-10-CM | POA: Insufficient documentation

## 2019-12-05 DIAGNOSIS — M549 Dorsalgia, unspecified: Secondary | ICD-10-CM | POA: Insufficient documentation

## 2019-12-05 DIAGNOSIS — R222 Localized swelling, mass and lump, trunk: Secondary | ICD-10-CM | POA: Insufficient documentation

## 2019-12-05 DIAGNOSIS — G9520 Unspecified cord compression: Secondary | ICD-10-CM | POA: Insufficient documentation

## 2019-12-05 DIAGNOSIS — G8220 Paraplegia, unspecified: Secondary | ICD-10-CM | POA: Insufficient documentation

## 2019-12-05 DIAGNOSIS — R54 Age-related physical debility: Secondary | ICD-10-CM | POA: Insufficient documentation

## 2019-12-05 DIAGNOSIS — Z923 Personal history of irradiation: Secondary | ICD-10-CM | POA: Insufficient documentation

## 2019-12-05 DIAGNOSIS — F329 Major depressive disorder, single episode, unspecified: Secondary | ICD-10-CM | POA: Insufficient documentation

## 2019-12-05 DIAGNOSIS — J441 Chronic obstructive pulmonary disease with (acute) exacerbation: Secondary | ICD-10-CM | POA: Insufficient documentation

## 2019-12-05 DIAGNOSIS — G4700 Insomnia, unspecified: Secondary | ICD-10-CM | POA: Insufficient documentation

## 2019-12-05 DIAGNOSIS — Z8249 Family history of ischemic heart disease and other diseases of the circulatory system: Secondary | ICD-10-CM | POA: Insufficient documentation

## 2019-12-05 DIAGNOSIS — E46 Unspecified protein-calorie malnutrition: Secondary | ICD-10-CM | POA: Insufficient documentation

## 2019-12-05 NOTE — Progress Notes (Signed)
Nutrition Assessment:  Referral for weight loss  64 year old male with RUL lung cancer, liver mets, spinal cord compression/vertebral mets.  S/p T1-T6 laminectomy with posterior fusion at [facility] on 4/12.  Noted hospital admission at [facility] 5/4-5/5.  Past medical history of COPD.  Likely systemic chemotherapy after radiation.  Meeting with radiation today.    Met with patient and wife in clinic.  Patient with shortness of breath during visit.  Patient reports good appetite since surgery in April.  Wife reports that prior to all this he would eat about 1 time per day but recently has been eating mostly 3 meals per day and snacks.  Drinks 2 ensure daily (sounds like high protein 150 calories).  Typically at 8 am will drink gatorade with metamucil, drink ensure shake and eat 1-2 nutrigrain bars or will have peanut butter and jelly wrap.  Thicker bread is harder for him to chew.  Noon will eat roast beef or pimento cheese wrap with chips, protein shake and water.  May have an am snack of peanut butter nabs.  Dinner is usually meat and vegetable or some kind of side item.  Likes ice cream.  NFPE: Upper arm region severe Ribs severe [place] moderate Scapula moderate Hand moderate Thigh severe Patella moderate calf moderate  Medications: MVI, predinsone  Labs: glucose 162, Na 137  Anthropometrics:   Height: 69 inches Weight: 114 lb 3.2 oz on 5/5 UBW: 120-125 lb for long time 10/21/19 117 lb noted per chart 07/31/2017 134 lb BMI: 16  3% weight loss in the last month   Estimated Energy Needs  Kcals: 1600-1800 Protein: 80-90 g Fluid: > 1.6 L  NUTRITION DIAGNOSIS: Inadequate oral intake related to cancer, hospitalizations, shortness of breath as evidenced by BMI 16   INTERVENTION:  Discussed ways to add more calories and protein to diet.  Handout from AND provided on High Calorie, High Protein Nutrition Therapy. Recipes given as well Encouraged 350 calorie shake or higher. Samples given of  ensure, boost, kate farms, orgain Contact information provided    MONITORING, EVALUATION, GOAL: weight, intake   NEXT VISIT: June 3rd, phone (Thursday)  Fatiha Guzy B. Zenia Resides, [place], [place] Registered Dietitian [phone] (pager)

## 2019-12-05 NOTE — Consult Note (Signed)
NEW PATIENT EVALUATION  Name: Cody [name]  MRN: [id number]  Date:   12/05/2019     DOB: 04/17/1956   This 64 y.o. male patient presents to the clinic for initial evaluation of stage IV lung cancer status post spinal cord decompression of the thoracic spine for metastatic disease.  REFERRING PHYSICIAN: Steele Sizer, MD  CHIEF COMPLAINT:  Chief Complaint  Patient presents with  . Spine metastisis    DIAGNOSIS: The encounter diagnosis was Metastatic cancer to spine ([place]).   PREVIOUS INVESTIGATIONS:  MRI scans of thoracic spine MRI scans of lumbar spine and PET scan to be performed tomorrow all reviewed Pathology report reviewed Clinical notes reviewed  HPI: Patient is a 64 year old male who presented with severe pain in his upper back.  He was evaluated [facility] transfer to [facility] for spinal cord decompression.  He had a known right upper lobe mass had extensive metastatic disease to spine and liver with liver biopsy positive for metastatic adenocarcinoma consistent with lung primary.  His PDL had low expression.  [facility] performed a laminectomy and posterior fusion with disease involving T2-T4.  He is seen today for consideration of radiation therapy to his thoracic spine.  He is wheelchair-bound he is walking with a walker.  He has some numbness in his lower extremities and his proprioception is obviously diminished.  He is being planned to start systemic chemotherapy with Hollie Salk and Keytruda after finishing palliative radiation.  He is seen today for radiation oncology opinion.  PLANNED TREATMENT REGIMEN: Palliative thoracic radiation  PAST MEDICAL HISTORY:  has a past medical history of Allergy, COPD (chronic obstructive pulmonary disease) ([place]), and Prostate enlargement.    PAST SURGICAL HISTORY:  Past Surgical History:  Procedure Laterality Date  . HERNIA REPAIR Bilateral 1982  . HERNIA REPAIR Right 1994  . LAMINECTOMY FOR EXCISION / EVACUATION INTRASPINAL LESION  11/07/2019    T1-T 6 at [facility]     FAMILY HISTORY: family history includes CVA in his mother; Heart disease in his father; Lung cancer in his brother and sister.  SOCIAL HISTORY:  reports that he quit smoking about 8 years ago. He has a 60.00 pack-year smoking history. He has never used smokeless tobacco. He reports current alcohol use of about 5.0 standard drinks of alcohol per week. He reports that he does not use drugs.  ALLERGIES: Patient has no known allergies.  MEDICATIONS:  Current Outpatient Medications  Medication Sig Dispense Refill  . acetaminophen (TYLENOL) 500 MG tablet Take 500-1,000 mg by mouth every 6 (six) hours as needed for mild pain or fever.     . Fluticasone-Umeclidin-Vilant 100-62.5-25 MCG/INH AEPB Inhale 1 puff into the lungs daily.     Marland Kitchen LORazepam (ATIVAN) 0.5 MG tablet Take 1 tablet (0.5 mg total) by mouth every 8 (eight) hours as needed for up to 7 days for anxiety, sedation or sleep. 20 tablet 0  . Multiple Vitamins-Minerals (MULTIVITAMIN GUMMIES MENS PO) Take 1 Dose by mouth daily.     . predniSONE (DELTASONE) 20 MG tablet Take 2 tablets (40 mg total) by mouth daily with breakfast for 4 days. 8 tablet 0   No current facility-administered medications for this encounter.    ECOG PERFORMANCE STATUS:  2 - Symptomatic, <50% confined to bed  REVIEW OF SYSTEMS: Patient denies any weight loss, fatigue, weakness, fever, chills or night sweats. Patient denies any loss of vision, blurred vision. Patient denies any ringing  of the ears or hearing loss. No irregular heartbeat. Patient denies heart  murmur or history of fainting. Patient denies any chest pain or pain radiating to her upper extremities. Patient denies any shortness of breath, difficulty breathing at night, cough or hemoptysis. Patient denies any swelling in the lower legs. Patient denies any nausea vomiting, vomiting of blood, or coffee ground material in the vomitus. Patient denies any stomach pain. Patient states has had  normal bowel movements no significant constipation or diarrhea. Patient denies any dysuria, hematuria or significant nocturia. Patient denies any problems walking, swelling in the joints or loss of balance. Patient denies any skin changes, loss of hair or loss of weight. Patient denies any excessive worrying or anxiety or significant depression. Patient denies any problems with insomnia. Patient denies excessive thirst, polyuria, polydipsia. Patient denies any swollen glands, patient denies easy bruising or easy bleeding. Patient denies any recent infections, allergies or URI. Patient "s visual fields have not changed significantly in recent time.   PHYSICAL EXAM: BP 108/67 (BP Location: Left Arm, Patient Position: Sitting, Cuff Size: Normal)   Pulse 85   Temp 98 F (36.7 C) (Tympanic)   Resp 67  Wheelchair-bound male on nasal oxygen in NAD.  Patient does have decreased proprioception left lower extremity greater than right.  Some decreased motor and sensory levels in his lower extremities are noted.  Upper extremities are equal and symmetric.  Well-developed well-nourished patient in NAD. HEENT reveals PERLA, EOMI, discs not visualized.  Oral cavity is clear. No oral mucosal lesions are identified. Neck is clear without evidence of cervical or supraclavicular adenopathy. Lungs are clear to A&P. Cardiac examination is essentially unremarkable with regular rate and rhythm without murmur rub or thrill. Abdomen is benign with no organomegaly or masses noted. Motor sensory and DTR levels are equal and symmetric in the upper and lower extremities. Cranial nerves II through XII are grossly intact. Proprioception is intact. No peripheral adenopathy or edema is identified. No motor or sensory levels are noted. Crude visual fields are within normal range.  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  Risks and benefits of treatment including possible radiation esophagitis fatigue alteration of blood count skin reaction all were discussed in detail with the patient.  He seems to comprehend my treatment plan well.  I have personally set up and ordered CT simulation for later this week.  Patient and his wife both comprehend my treatment plan well.  I would like to take this opportunity to thank you for allowing me to participate in the care of your patient.Noreene Filbert, MD

## 2019-12-05 NOTE — Telephone Encounter (Signed)
Patient is calling regarding scheduling a hospital follow up and is needing a medication refill of LORazepam (ATIVAN) 0.5 MG tablet [[id number]] that was prescribed in the hospital. Hospital Follow up is scheduled for 12/16/19. However, the patient will run out of medication this week. Please advise Preferred Pharmacy [facility], [place]

## 2019-12-05 NOTE — Progress Notes (Signed)
  Oncology Nurse Navigator Documentation  Navigator Location: [facility] On[facility] (12/05/19 1400)   )Navigator Encounter Type: Initial RadOnc (12/05/19 1400)   Abnormal Finding Date: 10/31/19 (12/05/19 1400) Confirmed Diagnosis Date: 11/02/19 (12/05/19 1400)                 Treatment Phase: Pre-Tx/Tx Discussion (12/05/19 1400) Barriers/Navigation Needs: Coordination of Care;Education (12/05/19 1400) Education: Newly Diagnosed Cancer Education;Understanding Cancer/ Treatment Options (12/05/19 1400) Interventions: Coordination of Care;Education (12/05/19 1400)   Coordination of Care: Appts;Radiology (12/05/19 1400) Education Method: Verbal;Written (12/05/19 1400)      Acuity: Level 2-Minimal Needs (1-2 Barriers Identified) (12/05/19 1400)    met with patient during initial rad-onc consultation with Dr. Baruch Gouty. All questions answered during visit. Pt given resources regarding diagnosis and supportive services available. Reviewed upcoming appts. Contact info given and instructed to call with any further questions or needs. Pt verbalized understanding. Nothing further needed at this time.     Time Spent with Patient: 45 (12/05/19 1400)

## 2019-12-06 ENCOUNTER — Other Ambulatory Visit: Payer: Self-pay

## 2019-12-06 ENCOUNTER — Ambulatory Visit
Admission: RE | Admit: 2019-12-06 | Discharge: 2019-12-06 | Disposition: A | Discharge status: Home / Self Care | Payer: Medicare HMO | Source: Ambulatory Visit | Attending: Internal Medicine | Admitting: Internal Medicine

## 2019-12-06 DIAGNOSIS — J439 Emphysema, unspecified: Secondary | ICD-10-CM | POA: Diagnosis not present

## 2019-12-06 DIAGNOSIS — Z79899 Other long term (current) drug therapy: Secondary | ICD-10-CM | POA: Diagnosis not present

## 2019-12-06 DIAGNOSIS — C787 Secondary malignant neoplasm of liver and intrahepatic bile duct: Secondary | ICD-10-CM | POA: Insufficient documentation

## 2019-12-06 DIAGNOSIS — C3411 Malignant neoplasm of upper lobe, right bronchus or lung: Secondary | ICD-10-CM | POA: Diagnosis present

## 2019-12-06 DIAGNOSIS — Z87891 Personal history of nicotine dependence: Secondary | ICD-10-CM | POA: Insufficient documentation

## 2019-12-06 DIAGNOSIS — Z981 Arthrodesis status: Secondary | ICD-10-CM | POA: Diagnosis not present

## 2019-12-06 DIAGNOSIS — C3490 Malignant neoplasm of unspecified part of unspecified bronchus or lung: Secondary | ICD-10-CM | POA: Diagnosis not present

## 2019-12-06 DIAGNOSIS — I700 Atherosclerosis of aorta: Secondary | ICD-10-CM | POA: Insufficient documentation

## 2019-12-06 LAB — GLUCOSE, CAPILLARY: Glucose-Capillary: 96 mg/dL (ref 70–99)

## 2019-12-06 MED ORDER — FLUDEOXYGLUCOSE F - 18 (FDG) INJECTION
5.9000 | Freq: Once | INTRAVENOUS | Status: AC | PRN
  Administered 2019-12-06: 6.507 via INTRAVENOUS

## 2019-12-06 NOTE — Telephone Encounter (Signed)
Please schedule him on Thursday as requested by [facility].

## 2019-12-06 NOTE — Telephone Encounter (Signed)
Spoke with pt and appt switched to this Thursday

## 2019-12-07 ENCOUNTER — Ambulatory Visit
Admission: RE | Admit: 2019-12-07 | Discharge: 2019-12-07 | Disposition: A | Discharge status: Home / Self Care | Payer: Medicare HMO | Source: Ambulatory Visit | Attending: Radiation Oncology | Admitting: Radiation Oncology

## 2019-12-07 ENCOUNTER — Inpatient Hospital Stay (HOSPITAL_BASED_OUTPATIENT_CLINIC_OR_DEPARTMENT_OTHER): Payer: Medicare HMO | Admitting: Hospice and Palliative Medicine

## 2019-12-07 ENCOUNTER — Inpatient Hospital Stay: Payer: Medicare HMO | Admitting: Internal Medicine

## 2019-12-07 DIAGNOSIS — G4700 Insomnia, unspecified: Secondary | ICD-10-CM | POA: Diagnosis not present

## 2019-12-07 DIAGNOSIS — C7951 Secondary malignant neoplasm of bone: Secondary | ICD-10-CM | POA: Diagnosis not present

## 2019-12-07 DIAGNOSIS — C787 Secondary malignant neoplasm of liver and intrahepatic bile duct: Secondary | ICD-10-CM | POA: Diagnosis not present

## 2019-12-07 DIAGNOSIS — J441 Chronic obstructive pulmonary disease with (acute) exacerbation: Secondary | ICD-10-CM | POA: Diagnosis not present

## 2019-12-07 DIAGNOSIS — R222 Localized swelling, mass and lump, trunk: Secondary | ICD-10-CM | POA: Diagnosis not present

## 2019-12-07 DIAGNOSIS — I700 Atherosclerosis of aorta: Secondary | ICD-10-CM | POA: Diagnosis not present

## 2019-12-07 DIAGNOSIS — Z5112 Encounter for antineoplastic immunotherapy: Secondary | ICD-10-CM | POA: Diagnosis present

## 2019-12-07 DIAGNOSIS — G8220 Paraplegia, unspecified: Secondary | ICD-10-CM | POA: Diagnosis not present

## 2019-12-07 DIAGNOSIS — C3411 Malignant neoplasm of upper lobe, right bronchus or lung: Secondary | ICD-10-CM

## 2019-12-07 DIAGNOSIS — Z510 Encounter for antineoplastic radiation therapy: Secondary | ICD-10-CM | POA: Diagnosis not present

## 2019-12-07 DIAGNOSIS — Z923 Personal history of irradiation: Secondary | ICD-10-CM | POA: Diagnosis not present

## 2019-12-07 DIAGNOSIS — Z515 Encounter for palliative care: Secondary | ICD-10-CM

## 2019-12-07 DIAGNOSIS — E46 Unspecified protein-calorie malnutrition: Secondary | ICD-10-CM | POA: Diagnosis not present

## 2019-12-07 DIAGNOSIS — Z7189 Other specified counseling: Secondary | ICD-10-CM

## 2019-12-07 DIAGNOSIS — M2550 Pain in unspecified joint: Secondary | ICD-10-CM | POA: Diagnosis not present

## 2019-12-07 DIAGNOSIS — G9520 Unspecified cord compression: Secondary | ICD-10-CM | POA: Diagnosis not present

## 2019-12-07 DIAGNOSIS — R54 Age-related physical debility: Secondary | ICD-10-CM | POA: Diagnosis not present

## 2019-12-07 DIAGNOSIS — Z9981 Dependence on supplemental oxygen: Secondary | ICD-10-CM | POA: Diagnosis not present

## 2019-12-07 DIAGNOSIS — R64 Cachexia: Secondary | ICD-10-CM | POA: Diagnosis not present

## 2019-12-07 DIAGNOSIS — F329 Major depressive disorder, single episode, unspecified: Secondary | ICD-10-CM | POA: Diagnosis not present

## 2019-12-07 DIAGNOSIS — Z79899 Other long term (current) drug therapy: Secondary | ICD-10-CM | POA: Diagnosis not present

## 2019-12-07 DIAGNOSIS — Z87891 Personal history of nicotine dependence: Secondary | ICD-10-CM | POA: Diagnosis not present

## 2019-12-07 DIAGNOSIS — J90 Pleural effusion, not elsewhere classified: Secondary | ICD-10-CM | POA: Diagnosis not present

## 2019-12-07 DIAGNOSIS — Z7289 Other problems related to lifestyle: Secondary | ICD-10-CM | POA: Diagnosis not present

## 2019-12-07 DIAGNOSIS — I2510 Atherosclerotic heart disease of native coronary artery without angina pectoris: Secondary | ICD-10-CM | POA: Diagnosis not present

## 2019-12-07 DIAGNOSIS — M549 Dorsalgia, unspecified: Secondary | ICD-10-CM | POA: Diagnosis not present

## 2019-12-07 DIAGNOSIS — N400 Enlarged prostate without lower urinary tract symptoms: Secondary | ICD-10-CM | POA: Diagnosis not present

## 2019-12-07 MED ORDER — ONDANSETRON HCL 4 MG PO TABS: 4.0000 mg | ORAL_TABLET | Freq: Three times a day (TID) | ORAL | 2 refills | Status: DC | PRN

## 2019-12-07 NOTE — Assessment & Plan Note (Addendum)
#  Stage IV metastatic non-small cell lung cancer favor adeno; mets to bone; liver; NGS pending.  PET scan May 2021-shows multiple liver lesions; lung lesions bone lesions.   #Patient awaiting radiation to the back status post decompressive surgery.   #Patient with understand that patient needs palliative systemic therapy; however concerned about patient's poor tolerance of therapy given his advanced COPD/recent admission to hospital etc.  Monitor closely await NGS-to decide on the chemotherapy/systemic therapy regimen.  #Bone metastases; spinal cord compression status post decompressive surgery and fusion; discussed regarding Zometa.  Will start at next visit.  #Malnutrition/cachexia-discussed regarding protein supplementation; will make a dietary referral.  #Spinal cord compression with residual weakness-continuing physical therapy.   # COPD-stable on Trelegy.  #Pain-underlying malignancy; stable continue tramadol/Tylenol.  #Insomnia-continue Seroquel.  #Palliative care evaluation-today with Josh.  Discussed with the patient and brother that unfortunately prognosis is quite poor given patient's frail status severe COPD and advanced malignancy.  # DISPOSITION: release # follow up in 2 weeks; MD; labs- cbc/cmp-CEA; Zometa-Dr.B  # I reviewed the blood work- with the patient in detail; also reviewed the imaging independently [as summarized above]; and with the patient in detail.   Addendum: PD-L1 10%; rest of the foundation 1 results pending.  I would recommend starting single agent Keytruda.    Addendum:

## 2019-12-07 NOTE — Progress Notes (Signed)
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   # NGS/MOLECULAR TESTS:   # PALLIATIVE CARE EVALUATION:  # PAIN MANAGEMENT:    DIAGNOSIS:   STAGE:         ;  GOALS:  CURRENT/MOST RECENT THERAPY :     Cancer of upper lobe of right lung ([place])  11/23/2019 Initial Diagnosis   Cancer of upper lobe of right lung ([place])   12/12/2019 -  Chemotherapy   The patient had PALONOSETRON HCL INJECTION 0.25 MG/5ML, 0.25 mg, Intravenous,  Once, 0 of 4 cycles PEMETREXED CHEMO IV INFUSION, 500 mg/m2, Intravenous,  Once, 0 of 6 cycles CARBOPLATIN CHEMO IV INFUSION ORDERABLE (BY [facility]), , Intravenous,  Once, 0 of 4 cycles FOSAPREPITANT IV INFUSION 150 MG, 150 mg, Intravenous,  Once, 0 of 4 cycles PEMBROLIZUMAB CHEMO IV INFUSION, 200 mg, Intravenous, Once, 0 of 6 cycles  for chemotherapy treatment.       HISTORY OF PRESENTING ILLNESS:  Cody [name] 64 y.o.  male patient with metastatic non-small cell lung cancer; advanced COPD is here for follow-up/review of PET scan.  The interim patient was admitted to hospital for COPD exacerbation.  The interim patient has been evaluated radiation oncology awaiting starting radiation of the back.  Patient continues to have weakness in his bilateral extremities.  He continues his walker at home.   Review of Systems  Constitutional:  Positive for malaise/fatigue and weight loss. Negative for chills, diaphoresis and fever.  HENT: Negative for nosebleeds and sore throat.   Eyes: Negative for double vision.  Respiratory: Positive for shortness of breath. Negative for cough, hemoptysis, sputum production and wheezing.   Cardiovascular: Negative for chest pain, palpitations, orthopnea and leg swelling.  Gastrointestinal: Negative for abdominal pain, blood in stool, constipation, diarrhea, heartburn, melena, nausea and vomiting.  Genitourinary: Negative for dysuria, frequency and urgency.  Musculoskeletal: Positive for back pain and joint pain.  Skin: Negative.  Negative for itching and rash.  Neurological: Negative for dizziness, tingling, focal weakness, weakness and headaches.  Endo/Heme/Allergies: Does not bruise/bleed easily.  Psychiatric/Behavioral: Negative for depression. The patient is not nervous/anxious and does not have insomnia.      MEDICAL HISTORY:  Past Medical History:  Diagnosis Date  . Allergy   . COPD (chronic obstructive pulmonary disease) ([place])   . Prostate enlargement     SURGICAL HISTORY: Past Surgical History:  Procedure Laterality Date  . HERNIA REPAIR Bilateral 1982  . HERNIA REPAIR Right 1994  . LAMINECTOMY FOR EXCISION / EVACUATION INTRASPINAL LESION  11/07/2019   T1-T 6 at [facility]     SOCIAL HISTORY: Social History   Socioeconomic History  . Marital status: Married    Spouse name: [name]   . Number of children: 4  . Years of education: Not on file  . Highest education level: Some college, no degree  Occupational History  . Occupation: dispatch and delivery   Tobacco Use  . Smoking status:  Former Smoker    Packs/day: 2.00    Years: 30.00    Pack years: 60.00    Quit date: 07/29/2011    Years since quitting: 8.3  . Smokeless tobacco: Never Used  . Tobacco comment: pt vapes  Substance and Sexual Activity  . Alcohol use: Yes    Alcohol/week: 5.0 standard drinks    Types: 5  Standard drinks or equivalent per week    Comment: 1-2/day  . Drug use: No  . Sexual activity: Not Currently    Partners: Female  Other Topics Concern  . Not on file  Social History Narrative   Worked in warehouse/ quit smoking in 2015; beer 1/day; in [place]; with wife.    Social Determinants of Health   Financial Resource Strain: Low Risk   . Difficulty of Paying Living Expenses: Not hard at all  Food Insecurity: No Food Insecurity  . Worried About Charity fundraiser in the Last Year: Never true  . Ran Out of Food in the Last Year: Never true  Transportation Needs: No Transportation Needs  . Lack of Transportation (Medical): No  . Lack of Transportation (Non-Medical): No  Physical Activity: Insufficiently Active  . Days of Exercise per Week: 7 days  . Minutes of Exercise per Session: 20 min  Stress: No Stress Concern Present  . Feeling of Stress : Not at all  Social Connections: Not Isolated  . Frequency of Communication with Friends and Family: More than three times a week  . Frequency of Social Gatherings with Friends and Family: More than three times a week  . Attends Religious Services: More than 4 times per year  . Active Member of Clubs or Organizations: Yes  . Attends Archivist Meetings: More than 4 times per year  . Marital Status: Married  Human resources officer Violence: Not At Risk  . Fear of Current or Ex-Partner: No  . Emotionally Abused: No  . Physically Abused: No  . Sexually Abused: No    FAMILY HISTORY: Family History  Problem Relation Age of Onset  . Heart disease Father   . CVA Mother   . Lung cancer Sister   . Lung cancer Brother     ALLERGIES:  has No Known Allergies.  MEDICATIONS:  Current Outpatient Medications  Medication Sig Dispense Refill  . acetaminophen (TYLENOL) 500 MG tablet Take 500-1,000 mg by mouth every 6 (six) hours as needed for mild pain or fever.     . Fluticasone-Umeclidin-Vilant 100-62.5-25 MCG/INH AEPB Inhale 1  puff into the lungs daily.     . Multiple Vitamins-Minerals (MULTIVITAMIN GUMMIES MENS PO) Take 1 Dose by mouth daily.     Marland Kitchen escitalopram (LEXAPRO) 5 MG tablet Take 1 tablet (5 mg total) by mouth daily. am's 30 tablet 0  . ondansetron (ZOFRAN) 4 MG tablet Take 1 tablet (4 mg total) by mouth every 8 (eight) hours as needed for nausea or vomiting. 20 tablet 2  . QUEtiapine (SEROQUEL) 25 MG tablet Take 1 tablet (25 mg total) by mouth at bedtime. 30 tablet 0   No current facility-administered medications for this visit.      Marland Kitchen  PHYSICAL EXAMINATION: ECOG PERFORMANCE STATUS: 3 - Symptomatic, >50% confined to bed  Vitals:   12/07/19 1119  BP: 131/74  Pulse: 94  Temp: (!) 95 F (35 C)  SpO2: 100%   Filed Weights   12/07/19 1119  Weight: 121 lb (54.9 kg)    Physical Exam  Constitutional: He is oriented to  person, place, and time.  Thin built frail appearing male patient.  No acute distress.  HEENT negative.  Accompanied by his brother.  HENT:  Head: Normocephalic and atraumatic.  Mouth/Throat: Oropharynx is clear and moist. No oropharyngeal exudate.  Eyes: Pupils are equal, round, and reactive to light.  Cardiovascular: Normal rate and regular rhythm.  Pulmonary/Chest: No respiratory distress. He has no wheezes.  Decreased air entry bilaterally no wheeze or crackles.  Abdominal: Soft. Bowel sounds are normal. He exhibits no distension and no mass. There is no abdominal tenderness. There is no rebound and no guarding.  Musculoskeletal:        General: No tenderness or edema. Normal range of motion.     Cervical back: Normal range of motion and neck supple.  Neurological: He is alert and oriented to person, place, and time.  Skin: Skin is warm.  Psychiatric: Affect normal.     LABORATORY DATA:  I have reviewed the data as listed Lab Results  Component Value Date   WBC 9.4 11/30/2019   HGB 10.7 (L) 11/30/2019   HCT 33.2 (L) 11/30/2019   MCV 93.5 11/30/2019   PLT 429 (H)  11/30/2019   Recent Labs    11/23/19 1222 11/29/19 0247 11/30/19 0600  NA 136 134* 132*  K 4.2 4.1 4.8  CL 101 97* 98  CO2 _0 GLUCOSE 141* 108* 162*  BUN _1 CREATININE 0.59* 0.62 0.39*  CALCIUM 8.2* 8.8* 8.6*  GFRNONAA >60 >60 >60  GFRAA >60 >60 >60  PROT 6.4* 7.4 6.1*  ALBUMIN 2.8* 3.3* 2.6*  AST 33 42* 26  ALT 39 35 28  ALKPHOS 244* 266* 197*  BILITOT 0.5 0.7 0.6    RADIOGRAPHIC STUDIES: I have personally reviewed the radiological images as listed and agreed with the findings in the report. CT Angio Chest PE W and/or Wo Contrast  Result Date: 11/29/2019 CLINICAL DATA:  Shortness of breath and dyspnea, known lung cancer, recent spinal survey EXAM: CT ANGIOGRAPHY CHEST WITH CONTRAST TECHNIQUE: Multidetector CT imaging of the chest was performed using the standard protocol during bolus administration of intravenous contrast. Multiplanar CT image reconstructions and MIPs were obtained to evaluate the vascular anatomy. CONTRAST:  36m OMNIPAQUE IOHEXOL 350 MG/ML SOLN COMPARISON:  CT 10/31/2019, radiograph 11/29/2019 FINDINGS: Cardiovascular: Satisfactory opacification the pulmonary arteries to the segmental level. No pulmonary artery filling defects are identified. Central pulmonary arteries are normal caliber. Normal cardiac size. No pericardial effusion. Few coronary artery calcifications are present. Calcific plaque is present within the normal caliber thoracic aorta. Normal 3 vessel branching of the aortic arch with minimal calcification of the proximal great vessels. Mediastinum/Nodes: No mediastinal fluid or gas. Normal thyroid gland and thoracic inlet. No acute abnormality of the trachea or esophagus. No worrisome mediastinal, hilar or axillary adenopathy. Lungs/Pleura: Severe centrilobular and paraseptal emphysematous changes. Multiple right upper lobe pulmonary masses are redemonstrated is include a 2.2 x 1.8 cm spiculated region of low attenuation in the anterior apex  (6/22). A smaller 1.3 by 0.9 cm focus is seen in the posterior apex (6/15). There is a more ill-defined heterogeneous region of soft tissue density extending across the posteromedial aspect of the right upper lobe and apex with extension into the chest chest wall with associated bony destruction the T3 and T4 vertebral bodies and third rib posteriorly. This is grossly similar in size and appearance to the comparison exam though difficult to fully assess given the phase of contrast timing and extensive  streak artifact from patient's newly placed thoracic spine hardware which limits of soft tissue and bone resolution. A more smoothly marginated extension possibly a separate mass further extends laterally with destruction fourth rib in involvement of the chest wall similar to prior exam as well. Upper Abdomen: Redemonstration of the numerous peripherally enhancing lesions seen throughout the right and left lobe liver, largest in the right lobe measuring to 2.7 cm in size. No other acute upper abdominal abnormality. Musculoskeletal: Interval placement a T1-T6 posterior thoracic spinal fusion bridging the destructive pathologic fractures of the T3 and T4 vertebral bodies with transpedicular screws at the T1, T2, T5 and T6 levels. Paired posterior struts are noted on chest radiography. No acute hardware complication is seen. Hardware does result in significant streak artifact which limits evaluation of osseous structures and adjacent soft tissues. Destructive changes of the right chest wall and third and fourth ribs, as detailed above. Extension into the canal is difficult to assess on this examination. Review of the MIP images confirms the above findings. IMPRESSION: 1. No evidence of acute pulmonary embolism. 2. Redemonstration of multiple right upper lobe pulmonary masses with associated bony destruction of the T3 and T4 vertebral bodies and third and fourth ribs posteriorly as well as gross invasion of the posterior  chest wall. The degree of extension into the spinal canal is less well visualized on this study due to streak from interval placement of a T1-T6 spinal fusion. 3. Multiple peripherally enhancing lesions seen throughout the right and left lobe liver, largest in the right lobe measuring to 2.7 cm in size, compatible with metastatic disease. 4. No new pulmonary nodules or masses. No acute superimposed intrathoracic process. 5. Aortic Atherosclerosis (ICD10-I70.0) 6. Emphysema (ICD10-J43.9). Electronically Signed   By: Lovena Le M.D.   On: 11/29/2019 03:56   NM PET Image Initial (PI) Skull Base To Thigh  Result Date: 12/06/2019 CLINICAL DATA:  Initial treatment strategy for lung cancer post spinal fusion for thoracic spine involvement. EXAM: NUCLEAR MEDICINE PET SKULL BASE TO THIGH TECHNIQUE: 6.51 mCi F-18 FDG was injected intravenously. Full-ring PET imaging was performed from the skull base to thigh after the radiotracer. CT data was obtained and used for attenuation correction and anatomic localization. Fasting blood glucose: 96 mg/dl COMPARISON:  CT chest, abdomen and pelvis of 10/31/2019, CT angiography of the chest of 11/29/2019 FINDINGS: Mediastinal blood pool activity: SUV max 1.39 Liver activity: SUV max NA NECK: No hypermetabolic lymph nodes in the neck. Incidental CT findings: none CHEST: Soft tissue mass along the RIGHT lung apex destroying portions of the RIGHT second and third ribs and associated with multiple levels of the thoracic spine now post spinal fusion about the T3 level extending from T1, spanning T3 and T4 with distal pedicle screw placement at T5-T6. Soft tissue mass along the RIGHT paraspinal border measuring 5.9 x 2.4 cm (image 77, series 3) (SUVmax = 12.8, contiguous with soft tissue that extends posteriorly destroying posterior ribs 3 and 4. Pleural thickening along the anterior RIGHT upper lobe contiguous with pulmonary lesion. Lesion extending into the parenchyma measuring 2.2 x  2.2 cm (image 86, series 3) (SUVmax = 5.4. Pleural thickening extends away from the parenchymal portion at least 2 cm. Increased FDG uptake is also noted along intercostal musculature and associated with rib destruction in the posterior RIGHT upper chest. Soft tissue and increased FDG uptake crosses the midline at the T3 and T4 level with slightly less FDG uptake than the greatest area of uptake along the  RIGHT of the spine. No mediastinal adenopathy. No hilar adenopathy. No subcarinal adenopathy. No axillary lymphadenopathy.) Incidental CT findings: Severe pulmonary emphysema. Small RIGHT-sided pleural effusion without nodular areas of FDG avidity. Airways are patent. Calcified atherosclerotic changes of the thoracic aorta. Calcified coronary artery disease. ABDOMEN/PELVIS: Multifocal areas of FDG uptake in the liver 6 in total compatible with metastatic disease also seen on the recent chest abdomen and pelvis CT dimensions appears similar, better seen on previous exam. The largest area in the lateral section of the LEFT hepatic lobe (image 156 of series 3 (SUVmax = 13.2) greatest axial dimension of 4.1 cm as measured based on FDG uptake, this area measured approximately 4.2 cm on the previous CT. Largest area in the RIGHT hepatic lobe with area of uptake approximately 5.2 cm (image 179, series 3) (SUVmax = approximately 11.5, contiguous with an adjacent smaller area that was seen on the previous CT. Other areas display similar FDG uptake. No abnormal uptake in the adrenal glands. No visible adenopathy.) Incidental CT findings: Liver as described. Spleen without suspicious finding. Limited assessment of pancreas is grossly normal. No hydronephrosis. No signs of acute bowel process limited assessment due to respiratory motion and low-dose on the CT evaluation. Calcified atherosclerotic changes of the abdominal aorta tracking in the iliac vasculature. SKELETON: Destructive changes in the spine associated with the  RIGHT upper lobe and perispinal mass as described. Also associated with rib destruction also described above. Incidental CT findings: Multilevel laminectomy associated with the spinal fusion as seen on previous studies. Spinal degenerative changes. IMPRESSION: 1. Signs of bony destruction and large pleural and paraspinal mass in the RIGHT upper lobe with a second smaller area of disease in the peripheral anterior RIGHT upper lobe as described. 2. Signs of spinal fusion and laminectomy associated with T3-T4 destruction also with adjacent destruction of ribs as discussed. 3. Multifocal hepatic metastatic disease. 4. Aortic Atherosclerosis (ICD10-I70.0) and Emphysema (ICD10-J43.9). Electronically Signed   By: Zetta Bills M.D.   On: 12/06/2019 16:51   DG Chest Portable 1 View  Result Date: 11/29/2019 CLINICAL DATA:  Shortness of breath for 2-3 days, history of lung cancer EXAM: PORTABLE CHEST 1 VIEW COMPARISON:  CT 10/31/2019, radiograph 05/08/2017 FINDINGS: Redemonstration of the spiculated mass in the right lung apex with associated destructive changes of the posterior right third rib and upper thoracic vertebrae bridged by the vertebral fusion rods. Findings on a background of severe centrilobular and paraseptal emphysematous changes better demonstrated on comparison cross-sectional imaging. Chronic bronchitic features are present as well. No new consolidative opacity. No convincing features of pulmonary edema. Cardiomediastinal contours are stable. IMPRESSION: 1. Redemonstrated masses towards the right lung apex with associated destructive changes of the posterior right third rib and upper thoracic vertebrae bridged by hardware. 2. No acute cardiopulmonary abnormality. 3. Chronic bronchitic and emphysematous changes. Electronically Signed   By: Lovena Le M.D.   On: 11/29/2019 03:16    ASSESSMENT & PLAN:   Cancer of upper lobe of right lung ([place]) # Stage IV metastatic non-small cell lung cancer favor  adeno; mets to bone; liver; NGS pending.  PET scan May 2021-shows multiple liver lesions; lung lesions bone lesions.   #Patient awaiting radiation to the back status post decompressive surgery.   #Patient with understand that patient needs palliative systemic therapy; however concerned about patient's poor tolerance of therapy given his advanced COPD/recent admission to hospital etc.  Monitor closely await NGS-to decide on the chemotherapy/systemic therapy regimen.  #Bone metastases; spinal cord compression  status post decompressive surgery and fusion; discussed regarding Zometa.  Will start at next visit.  #Malnutrition/cachexia-discussed regarding protein supplementation; will make a dietary referral.  #Spinal cord compression with residual weakness-continuing physical therapy.   # COPD-stable on Trelegy.  #Pain-underlying malignancy; stable continue tramadol/Tylenol.  #Insomnia-continue Seroquel.  #Palliative care evaluation-today with Josh.  Discussed with the patient and brother that unfortunately prognosis is quite poor given patient's frail status severe COPD and advanced malignancy.  # DISPOSITION: release # follow up in 2 weeks; MD; labs- cbc/cmp-CEA; Zometa-Dr.B  # I reviewed the blood work- with the patient in detail; also reviewed the imaging independently [as summarized above]; and with the patient in detail.   Addendum: PD-L1 10%; rest of the foundation 1 results pending.  I would recommend starting single agent Keytruda.    Addendum:    All questions were answered. The patient knows to call the clinic with any problems, questions or concerns.    Cammie Sickle, MD 12/11/2019 6:05 PM

## 2019-12-07 NOTE — Progress Notes (Signed)
[facility]  [phone] [phone]   Name: Cody [name] Date: 12/07/2019 MRN: [id number]  DOB: 01-01-1956  Patient Care Team: Steele Sizer, MD as PCP - General (Family Medicine) Christene Lye, MD (General Surgery) Telford Nab, RN as Oncology Nurse Navigator    REASON FOR CONSULTATION: Cody [name] is a 64 y.o. 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  Patient was hospitalized from 11/29/2019 to 11/30/2019 with hypoxic respiratory failure.  He was discharged home on O2.  Patient is pending initiation of RT.  He was referred to palliative care to help address goals and manage ongoing symptoms.  SOCIAL HISTORY:     reports that he quit smoking about 8 years ago. He has a 60.00 pack-year smoking history. He has never used smokeless tobacco. He reports current alcohol use of about 5.0 standard drinks of alcohol per week. He reports that he does not use drugs.   Patient is married and lives at home with his wife.  He has 3 daughters who live in [place] and a son in [place].  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  ADVANCE DIRECTIVES:  Does not have  CODE STATUS:   PAST MEDICAL HISTORY: Past Medical History:  Diagnosis Date  . Allergy   . COPD (chronic obstructive pulmonary disease) ([place])   . Prostate enlargement     PAST SURGICAL HISTORY:  Past Surgical History:  Procedure Laterality Date  . HERNIA REPAIR Bilateral 1982  . HERNIA REPAIR Right 1994  . LAMINECTOMY FOR EXCISION / EVACUATION INTRASPINAL LESION  11/07/2019   T1-T 6 at [facility]     HEMATOLOGY/ONCOLOGY HISTORY:  Oncology History Overview Note  #  April 2021- RUL lung cancer; liver met; spinal cord compression/ vertebral mets [[facility]]; [[facility]]; LIVER Bx- Metastatic adenocarcinoma, consistent with lung primary.- TPS % Interpretation -PD-L1 IHC 10 LOW EXPRESSION POSITIVE   # 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   # NGS/MOLECULAR TESTS:   # PALLIATIVE CARE EVALUATION:  # PAIN MANAGEMENT:    DIAGNOSIS:   STAGE:         ;  GOALS:  CURRENT/MOST RECENT THERAPY :     Cancer of upper lobe of right lung ([place])  11/23/2019 Initial Diagnosis   Cancer of upper lobe of right lung ([facility])     ALLERGIES:  has No Known Allergies.  MEDICATIONS:  Current Outpatient Medications  Medication Sig Dispense Refill  . acetaminophen (TYLENOL) 500 MG tablet Take 500-1,000 mg by mouth every 6 (six) hours as needed for mild pain or fever.     . Fluticasone-Umeclidin-Vilant 100-62.5-25 MCG/INH AEPB Inhale 1 puff into the lungs daily.     Marland Kitchen LORazepam (ATIVAN) 0.5 MG tablet Take 1 tablet (0.5 mg total) by mouth every 8 (eight) hours as needed for up to 7 days for anxiety, sedation or sleep. 20 tablet 0  . Multiple Vitamins-Minerals (MULTIVITAMIN GUMMIES MENS PO) Take 1 Dose by mouth daily.      No current facility-administered medications for this visit.    VITAL SIGNS: There were no vitals taken for this visit. There were no vitals filed  for this visit.  Estimated body mass index is 17.87 kg/m as calculated from the following:   Height as of 11/29/19: _0  (1.753 m).   Weight as of an earlier encounter on 12/07/19: 121 lb (54.9 kg).  LABS: CBC:    Component Value Date/Time   WBC 9.4 11/30/2019 0600   HGB 10.7 (L) 11/30/2019 0600   HGB 15.2 10/21/2019 1517   HCT 33.2 (L) 11/30/2019 0600   HCT 43.0 10/21/2019 1517   PLT 429 (H) 11/30/2019 0600   PLT 325 10/21/2019 1517   MCV 93.5 11/30/2019 0600   MCV 86 10/21/2019 1517    NEUTROABS 5.2 11/23/2019 1222   NEUTROABS 5.9 10/21/2019 1517   LYMPHSABS 1.0 11/23/2019 1222   LYMPHSABS 1.3 10/21/2019 1517   MONOABS 0.7 11/23/2019 1222   EOSABS 0.1 11/23/2019 1222   EOSABS 0.0 10/21/2019 1517   BASOSABS 0.0 11/23/2019 1222   BASOSABS 0.1 10/21/2019 1517   Comprehensive Metabolic Panel:    Component Value Date/Time   NA 132 (L) 11/30/2019 0600   NA 135 10/21/2019 1517   K 4.8 11/30/2019 0600   CL 98 11/30/2019 0600   CO2 31 11/30/2019 0600   BUN 15 11/30/2019 0600   BUN 14 10/21/2019 1517   CREATININE 0.39 (L) 11/30/2019 0600   CREATININE 0.87 03/16/2017 1211   GLUCOSE 162 (H) 11/30/2019 0600   CALCIUM 8.6 (L) 11/30/2019 0600   AST 26 11/30/2019 0600   ALT 28 11/30/2019 0600   ALKPHOS 197 (H) 11/30/2019 0600   BILITOT 0.6 11/30/2019 0600   BILITOT 0.5 10/21/2019 1517   PROT 6.1 (L) 11/30/2019 0600   PROT 7.0 10/21/2019 1517   ALBUMIN 2.6 (L) 11/30/2019 0600   ALBUMIN 3.6 (L) 10/21/2019 1517    RADIOGRAPHIC STUDIES: CT Angio Chest PE W and/or Wo Contrast  Result Date: 11/29/2019 CLINICAL DATA:  Shortness of breath and dyspnea, known lung cancer, recent spinal survey EXAM: CT ANGIOGRAPHY CHEST WITH CONTRAST TECHNIQUE: Multidetector CT imaging of the chest was performed using the standard protocol during bolus administration of intravenous contrast. Multiplanar CT image reconstructions and MIPs were obtained to evaluate the vascular anatomy. CONTRAST:  31m OMNIPAQUE IOHEXOL 350 MG/ML SOLN COMPARISON:  CT 10/31/2019, radiograph 11/29/2019 FINDINGS: Cardiovascular: Satisfactory opacification the pulmonary arteries to the segmental level. No pulmonary artery filling defects are identified. Central pulmonary arteries are normal caliber. Normal cardiac size. No pericardial effusion. Few coronary artery calcifications are present. Calcific plaque is present within the normal caliber thoracic aorta. Normal 3 vessel branching of the aortic arch with minimal  calcification of the proximal great vessels. Mediastinum/Nodes: No mediastinal fluid or gas. Normal thyroid gland and thoracic inlet. No acute abnormality of the trachea or esophagus. No worrisome mediastinal, hilar or axillary adenopathy. Lungs/Pleura: Severe centrilobular and paraseptal emphysematous changes. Multiple right upper lobe pulmonary masses are redemonstrated is include a 2.2 x 1.8 cm spiculated region of low attenuation in the anterior apex (6/22). A smaller 1.3 by 0.9 cm focus is seen in the posterior apex (6/15). There is a more ill-defined heterogeneous region of soft tissue density extending across the posteromedial aspect of the right upper lobe and apex with extension into the chest chest wall with associated bony destruction the T3 and T4 vertebral bodies and third rib posteriorly. This is grossly similar in size and appearance to the comparison exam though difficult to fully assess given the phase of contrast timing and extensive streak artifact from patient's newly placed thoracic spine hardware which limits of  soft tissue and bone resolution. A more smoothly marginated extension possibly a separate mass further extends laterally with destruction fourth rib in involvement of the chest wall similar to prior exam as well. Upper Abdomen: Redemonstration of the numerous peripherally enhancing lesions seen throughout the right and left lobe liver, largest in the right lobe measuring to 2.7 cm in size. No other acute upper abdominal abnormality. Musculoskeletal: Interval placement a T1-T6 posterior thoracic spinal fusion bridging the destructive pathologic fractures of the T3 and T4 vertebral bodies with transpedicular screws at the T1, T2, T5 and T6 levels. Paired posterior struts are noted on chest radiography. No acute hardware complication is seen. Hardware does result in significant streak artifact which limits evaluation of osseous structures and adjacent soft tissues. Destructive changes of  the right chest wall and third and fourth ribs, as detailed above. Extension into the canal is difficult to assess on this examination. Review of the MIP images confirms the above findings. IMPRESSION: 1. No evidence of acute pulmonary embolism. 2. Redemonstration of multiple right upper lobe pulmonary masses with associated bony destruction of the T3 and T4 vertebral bodies and third and fourth ribs posteriorly as well as gross invasion of the posterior chest wall. The degree of extension into the spinal canal is less well visualized on this study due to streak from interval placement of a T1-T6 spinal fusion. 3. Multiple peripherally enhancing lesions seen throughout the right and left lobe liver, largest in the right lobe measuring to 2.7 cm in size, compatible with metastatic disease. 4. No new pulmonary nodules or masses. No acute superimposed intrathoracic process. 5. Aortic Atherosclerosis (ICD10-I70.0) 6. Emphysema (ICD10-J43.9). Electronically Signed   By: Lovena Le M.D.   On: 11/29/2019 03:56   NM PET Image Initial (PI) Skull Base To Thigh  Result Date: 12/06/2019 CLINICAL DATA:  Initial treatment strategy for lung cancer post spinal fusion for thoracic spine involvement. EXAM: NUCLEAR MEDICINE PET SKULL BASE TO THIGH TECHNIQUE: 6.51 mCi F-18 FDG was injected intravenously. Full-ring PET imaging was performed from the skull base to thigh after the radiotracer. CT data was obtained and used for attenuation correction and anatomic localization. Fasting blood glucose: 96 mg/dl COMPARISON:  CT chest, abdomen and pelvis of 10/31/2019, CT angiography of the chest of 11/29/2019 FINDINGS: Mediastinal blood pool activity: SUV max 1.39 Liver activity: SUV max NA NECK: No hypermetabolic lymph nodes in the neck. Incidental CT findings: none CHEST: Soft tissue mass along the RIGHT lung apex destroying portions of the RIGHT second and third ribs and associated with multiple levels of the thoracic spine now post  spinal fusion about the T3 level extending from T1, spanning T3 and T4 with distal pedicle screw placement at T5-T6. Soft tissue mass along the RIGHT paraspinal border measuring 5.9 x 2.4 cm (image 77, series 3) (SUVmax = 12.8, contiguous with soft tissue that extends posteriorly destroying posterior ribs 3 and 4. Pleural thickening along the anterior RIGHT upper lobe contiguous with pulmonary lesion. Lesion extending into the parenchyma measuring 2.2 x 2.2 cm (image 86, series 3) (SUVmax = 5.4. Pleural thickening extends away from the parenchymal portion at least 2 cm. Increased FDG uptake is also noted along intercostal musculature and associated with rib destruction in the posterior RIGHT upper chest. Soft tissue and increased FDG uptake crosses the midline at the T3 and T4 level with slightly less FDG uptake than the greatest area of uptake along the RIGHT of the spine. No mediastinal adenopathy. No hilar adenopathy. No subcarinal  adenopathy. No axillary lymphadenopathy.) Incidental CT findings: Severe pulmonary emphysema. Small RIGHT-sided pleural effusion without nodular areas of FDG avidity. Airways are patent. Calcified atherosclerotic changes of the thoracic aorta. Calcified coronary artery disease. ABDOMEN/PELVIS: Multifocal areas of FDG uptake in the liver 6 in total compatible with metastatic disease also seen on the recent chest abdomen and pelvis CT dimensions appears similar, better seen on previous exam. The largest area in the lateral section of the LEFT hepatic lobe (image 156 of series 3 (SUVmax = 13.2) greatest axial dimension of 4.1 cm as measured based on FDG uptake, this area measured approximately 4.2 cm on the previous CT. Largest area in the RIGHT hepatic lobe with area of uptake approximately 5.2 cm (image 179, series 3) (SUVmax = approximately 11.5, contiguous with an adjacent smaller area that was seen on the previous CT. Other areas display similar FDG uptake. No abnormal uptake in the  adrenal glands. No visible adenopathy.) Incidental CT findings: Liver as described. Spleen without suspicious finding. Limited assessment of pancreas is grossly normal. No hydronephrosis. No signs of acute bowel process limited assessment due to respiratory motion and low-dose on the CT evaluation. Calcified atherosclerotic changes of the abdominal aorta tracking in the iliac vasculature. SKELETON: Destructive changes in the spine associated with the RIGHT upper lobe and perispinal mass as described. Also associated with rib destruction also described above. Incidental CT findings: Multilevel laminectomy associated with the spinal fusion as seen on previous studies. Spinal degenerative changes. IMPRESSION: 1. Signs of bony destruction and large pleural and paraspinal mass in the RIGHT upper lobe with a second smaller area of disease in the peripheral anterior RIGHT upper lobe as described. 2. Signs of spinal fusion and laminectomy associated with T3-T4 destruction also with adjacent destruction of ribs as discussed. 3. Multifocal hepatic metastatic disease. 4. Aortic Atherosclerosis (ICD10-I70.0) and Emphysema (ICD10-J43.9). Electronically Signed   By: Zetta Bills M.D.   On: 12/06/2019 16:51   DG Chest Portable 1 View  Result Date: 11/29/2019 CLINICAL DATA:  Shortness of breath for 2-3 days, history of lung cancer EXAM: PORTABLE CHEST 1 VIEW COMPARISON:  CT 10/31/2019, radiograph 05/08/2017 FINDINGS: Redemonstration of the spiculated mass in the right lung apex with associated destructive changes of the posterior right third rib and upper thoracic vertebrae bridged by the vertebral fusion rods. Findings on a background of severe centrilobular and paraseptal emphysematous changes better demonstrated on comparison cross-sectional imaging. Chronic bronchitic features are present as well. No new consolidative opacity. No convincing features of pulmonary edema. Cardiomediastinal contours are stable. IMPRESSION: 1.  Redemonstrated masses towards the right lung apex with associated destructive changes of the posterior right third rib and upper thoracic vertebrae bridged by hardware. 2. No acute cardiopulmonary abnormality. 3. Chronic bronchitic and emphysematous changes. Electronically Signed   By: Lovena Le M.D.   On: 11/29/2019 03:16    PERFORMANCE STATUS (ECOG) : 2 - Symptomatic, <50% confined to bed  Review of Systems Unless otherwise noted, a complete review of systems is negative.  Physical Exam General: NAD, frail appearing, in wheelchair Pulmonary: Unlabored, on O2 Extremities: no edema, no joint deformities Skin: no rashes Neurological: Weakness but otherwise nonfocal  IMPRESSION: I met with patient and his brother today in clinic.  I introduced palliative care services and attempted to establish therapeutic rapport.  Patient is pending initiation of RT.  Plan is for him to follow-up with Dr. Rogue Bussing in several weeks to further discuss treatment options.  Patient seems to recognize a intent of  treatment would be palliative but he says that his goals are aligned with treatment if any options are available.  He wants to live to "spend Christmas with my family."  Symptomatically, he remains weak.  He is ambulatory with use of a walker but says he is unable to stand independently.  His wife is assisting him with ADLs at home.  He is receiving home health physical therapy.  Note that patient was working until his recent hospitalization.  Patient says he has occasional pain for which he is taking acetaminophen as needed.  Says he has oxycodone at home but has rarely required its use.  He is having some difficulty sleeping for which he has been taking lorazepam and finds it effective.  Discussed issues with chronic use of benzodiazepines for insomnia.  He may benefit from rotating to mirtazapine or trazodone.  Mirtazapine might be a reasonable choice to help him with sleep and appetite.  Will  prescribe him an antiemetic in the event that he has nausea.  We did discuss constipation management if he requires use of opioids.  Patient does not have any ACP documents.  He plans to meet with an attorney to complete these.  I did review with him ACP documents and a MOST form today, which he took home to discuss with his family.  PLAN: -Continue current scope of treatment -Continue acetaminophen plus oxycodone as needed for pain -Continue lorazepam as needed for anxiety/insomnia.  Consider rotating to mirtazapine or trazodone -Zofran 4 mg p.o. every 8 hours as needed for nausea -Bowel regimen -ACP/MOST form reviewed -RTC in 2-3 weeks   Patient expressed understanding and was in agreement with this plan. He also understands that He can call the clinic at any time with any questions, concerns, or complaints.     Time Total: 30 minutes  Visit consisted of counseling and education dealing with the complex and emotionally intense issues of symptom management and palliative care in the setting of serious and potentially life-threatening illness.Greater than 50%  of this time was spent counseling and coordinating care related to the above assessment and plan.  Signed by: Altha Harm, PhD, NP-C

## 2019-12-08 ENCOUNTER — Encounter: Payer: Self-pay | Admitting: Family Medicine

## 2019-12-08 ENCOUNTER — Ambulatory Visit (INDEPENDENT_AMBULATORY_CARE_PROVIDER_SITE_OTHER): Payer: Medicare HMO | Admitting: Family Medicine

## 2019-12-08 ENCOUNTER — Other Ambulatory Visit: Payer: Self-pay

## 2019-12-08 ENCOUNTER — Telehealth: Payer: Self-pay | Admitting: *Deleted

## 2019-12-08 VITALS — BP 138/70 | HR 96 | Temp 97.5°F | Resp 16 | Ht 69.0 in | Wt 121.0 lb

## 2019-12-08 DIAGNOSIS — C3410 Malignant neoplasm of upper lobe, unspecified bronchus or lung: Secondary | ICD-10-CM

## 2019-12-08 DIAGNOSIS — E440 Moderate protein-calorie malnutrition: Secondary | ICD-10-CM | POA: Diagnosis not present

## 2019-12-08 DIAGNOSIS — G4700 Insomnia, unspecified: Secondary | ICD-10-CM | POA: Diagnosis not present

## 2019-12-08 DIAGNOSIS — R69 Illness, unspecified: Secondary | ICD-10-CM | POA: Diagnosis not present

## 2019-12-08 DIAGNOSIS — C7951 Secondary malignant neoplasm of bone: Secondary | ICD-10-CM | POA: Diagnosis not present

## 2019-12-08 DIAGNOSIS — Z09 Encounter for follow-up examination after completed treatment for conditions other than malignant neoplasm: Secondary | ICD-10-CM

## 2019-12-08 DIAGNOSIS — F341 Dysthymic disorder: Secondary | ICD-10-CM

## 2019-12-08 DIAGNOSIS — F419 Anxiety disorder, unspecified: Secondary | ICD-10-CM

## 2019-12-08 MED ORDER — ESCITALOPRAM OXALATE 5 MG PO TABS: 5.0000 mg | ORAL_TABLET | Freq: Every day | ORAL | 0 refills | Status: DC

## 2019-12-08 MED ORDER — QUETIAPINE FUMARATE 25 MG PO TABS: 25.0000 mg | ORAL_TABLET | Freq: Every day | ORAL | 0 refills | Status: DC

## 2019-12-08 NOTE — Progress Notes (Signed)
Name: Cody [name]   MRN: [id number]    DOB: 07-Jan-1956   Date:12/08/2019       Progress Note  Subjective  Chief Complaint  Chief Complaint  Patient presents with  . Hospitalization Follow-up  . Medication Refill    HPI  He came in with his son Denyse Amass that lives in [place]  Mr. 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 He also has COPD. He went to [facility] on 11/29/2019 because eof increase in SOB. When he arrived to [facility] pulse ox was down to 74 %. He was placed on Bipap and weaned to nasal cannula oxygen prior to discharge at 2 liters . He was given nebulizer treatments and prednisone that he finished on May 9 th, 2021. He still using Breo. He was given Lorazepam. He states he is is out of Lorazepam. He has been very depressed since yesterday. He states he did not get very good news from his oncologist. He was hopeful he could get chemo and radiation therapy. We spent a lot of time discussing his mental health. He has a very supportive family. His son that lives in [place] will be here for the next couple of months. He has been eating well and has gained a couple pounds. He is breathing better with oxygen. He is able to walk with a walker with assistance of walker and a caregiver. He denies cough or wheezing. He does not want to go back on Lorazepam but would like something for depression and also sleep    Patient Active Problem List   Diagnosis Date Noted  . Hypoxia 11/30/2019  . Acute and chronic respiratory failure with hypoxia ([place]) 11/29/2019  . Non-small cell cancer of upper lobe of lung ([place]) 11/29/2019  . COPD with acute exacerbation ([place]) 11/29/2019  . Cancer of upper lobe of right lung ([place]) 11/23/2019  . Metastatic cancer to spine ([place]) 11/01/2019  . BPH with elevated PSA 07/16/2015  . COPD bronchitis 07/16/2015  . Seasonal and perennial allergic rhinitis 04/24/2010    Past Surgical History:  Procedure Laterality Date  . HERNIA  REPAIR Bilateral 1982  . HERNIA REPAIR Right 1994  . LAMINECTOMY FOR EXCISION / EVACUATION INTRASPINAL LESION  11/07/2019   T1-T 6 at [facility]     Family History  Problem Relation Age of Onset  . Heart disease Father   . CVA Mother   . Lung cancer Sister   . Lung cancer Brother     Social History   Tobacco Use  . Smoking status: Former Smoker    Packs/day: 2.00    Years: 30.00    Pack years: 60.00    Quit date: 07/29/2011    Years since quitting: 8.3  . Smokeless tobacco: Never Used  . Tobacco comment: pt vapes  Substance Use Topics  . Alcohol use: Yes    Alcohol/week: 5.0 standard drinks    Types: 5 Standard drinks or equivalent per week    Comment: 1-2/day     Current Outpatient Medications:  .  acetaminophen (TYLENOL) 500 MG tablet, Take 500-1,000 mg by mouth every 6 (six) hours as needed for mild pain or fever. , Disp: , Rfl:  .  Fluticasone-Umeclidin-Vilant 100-62.5-25 MCG/INH AEPB, Inhale 1 puff into the lungs daily. , Disp: , Rfl:  .  Multiple Vitamins-Minerals (MULTIVITAMIN GUMMIES MENS PO), Take 1 Dose by mouth daily. , Disp: , Rfl:  .  ondansetron (ZOFRAN) 4 MG tablet, Take 1  tablet (4 mg total) by mouth every 8 (eight) hours as needed for nausea or vomiting., Disp: 20 tablet, Rfl: 2  No Known Allergies  I personally reviewed active problem list, medication list, allergies, family history, social history, health maintenance with the patient/caregiver today.   ROS  Ten systems reviewed and is negative except as mentioned in HPI   Objective  Vitals:   12/08/19 1043  BP: 138/70  Pulse: 96  Resp: 16  Temp: (!) 97.5 F (36.4 C)  TempSrc: Temporal  SpO2: (!) 89%  Weight: 121 lb (54.9 kg)  Height: 5\' 9"  (1.753 m)    Body mass index is 17.87 kg/m.  Physical Exam  Constitutional: Patient appears cachetic No distress.  HEENT: head atraumatic, normocephalic, pupils equal and reactive to light, neck supple Cardiovascular: Normal rate, regular rhythm and  normal heart sounds.  No murmur heard. No BLE edema. Pulmonary/Chest: Effort normal and breath sounds normal. No respiratory distress. Abdominal: Soft.  There is no tenderness. Psychiatric: Patient has a normal mood and affect. behavior is normal. Judgment and thought content normal.  Recent Results (from the past 2160 hour(s))  Comprehensive metabolic panel     Status: Abnormal   Collection Time: 10/21/19  3:17 PM  Result Value Ref Range   Glucose 94 65 - 99 mg/dL   BUN 14 8 - 27 mg/dL   Creatinine, Ser 0.62 (L) 0.76 - 1.27 mg/dL   GFR calc non Af Amer 104 >59 mL/min/1.73   GFR calc Af Amer 120 >59 mL/min/1.73   BUN/Creatinine Ratio 23 10 - 24   Sodium 135 134 - 144 mmol/L   Potassium 4.7 3.5 - 5.2 mmol/L   Chloride 95 (L) 96 - 106 mmol/L   CO2 28 20 - 29 mmol/L   Calcium 9.2 8.6 - 10.2 mg/dL   Total Protein 7.0 6.0 - 8.5 g/dL   Albumin 3.6 (L) 3.8 - 4.8 g/dL   Globulin, Total 3.4 1.5 - 4.5 g/dL   Albumin/Globulin Ratio 1.1 (L) 1.2 - 2.2   Bilirubin Total 0.5 0.0 - 1.2 mg/dL   Alkaline Phosphatase 272 (H) 39 - 117 IU/L   AST 29 0 - 40 IU/L   ALT 23 0 - 44 IU/L  CBC with Differential/Platelet     Status: None   Collection Time: 10/21/19  3:17 PM  Result Value Ref Range   WBC 7.9 3.4 - 10.8 x10E3/uL   RBC 5.02 4.14 - 5.80 x10E6/uL   Hemoglobin 15.2 13.0 - 17.7 g/dL   Hematocrit 43.0 37.5 - 51.0 %   MCV 86 79 - 97 fL   MCH 30.3 26.6 - 33.0 pg   MCHC 35.3 31.5 - 35.7 g/dL   RDW 12.3 11.6 - 15.4 %   Platelets 325 150 - 450 x10E3/uL   Neutrophils 75 Not Estab. %   Lymphs 16 Not Estab. %   Monocytes 8 Not Estab. %   Eos 0 Not Estab. %   Basos 1 Not Estab. %   Neutrophils Absolute 5.9 1.4 - 7.0 x10E3/uL   Lymphocytes Absolute 1.3 0.7 - 3.1 x10E3/uL   Monocytes Absolute 0.6 0.1 - 0.9 x10E3/uL   EOS (ABSOLUTE) 0.0 0.0 - 0.4 x10E3/uL   Basophils Absolute 0.1 0.0 - 0.2 x10E3/uL   Immature Granulocytes 0 Not Estab. %   Immature Grans (Abs) 0.0 0.0 - 0.1 x10E3/uL  C-reactive  protein     Status: Abnormal   Collection Time: 10/21/19  3:17 PM  Result Value Ref Range  CRP 50 (H) 0 - 10 mg/L  Hemoglobin A1c     Status: Abnormal   Collection Time: 10/21/19  3:17 PM  Result Value Ref Range   Hgb A1c MFr Bld 6.1 (H) 4.8 - 5.6 %    Comment:          Prediabetes: 5.7 - 6.4          Diabetes: >6.4          Glycemic control for adults with diabetes: <7.0    Est. average glucose Bld gHb Est-mCnc 128 mg/dL  Hepatitis C antibody     Status: None   Collection Time: 10/21/19  3:17 PM  Result Value Ref Range   Hep C Virus Ab <0.1 0.0 - 0.9 s/co ratio    Comment:                                   Negative:     < 0.8                              Indeterminate: 0.8 - 0.9                                   Positive:     > 0.9  The CDC recommends that a positive HCV antibody result  be followed up with a HCV Nucleic Acid Amplification  test ([id number]).   PSA     Status: Abnormal   Collection Time: 10/21/19  3:17 PM  Result Value Ref Range   Prostate Specific Ag, Serum 8.2 (H) 0.0 - 4.0 ng/mL    Comment: Roche ECLIA methodology. According to the American Urological Association, Serum PSA should decrease and remain at undetectable levels after radical prostatectomy. The AUA defines biochemical recurrence as an initial PSA value 0.2 ng/mL or greater followed by a subsequent confirmatory PSA value 0.2 ng/mL or greater. Values obtained with different assay methods or kits cannot be used interchangeably. Results cannot be interpreted as absolute evidence of the presence or absence of malignant disease.   TSH     Status: None   Collection Time: 10/21/19  3:17 PM  Result Value Ref Range   TSH 2.930 0.450 - 4.500 uIU/mL  Sedimentation rate     Status: Abnormal   Collection Time: 10/21/19  3:17 PM  Result Value Ref Range   Sed Rate 48 (H) 0 - 30 mm/hr  CBC with Differential     Status: None   Collection Time: 10/31/19  1:17 PM  Result Value Ref Range   WBC 9.1 4.0 - 10.5  K/uL   RBC 4.93 4.22 - 5.81 MIL/uL   Hemoglobin 14.6 13.0 - 17.0 g/dL   HCT 45.4 39.0 - 52.0 %   MCV 92.1 80.0 - 100.0 fL   MCH 29.6 26.0 - 34.0 pg   MCHC 32.2 30.0 - 36.0 g/dL   RDW 13.8 11.5 - 15.5 %   Platelets 372 150 - 400 K/uL   nRBC 0.0 0.0 - 0.2 %   Neutrophils Relative % 78 %   Neutro Abs 7.1 1.7 - 7.7 K/uL   Lymphocytes Relative 14 %   Lymphs Abs 1.2 0.7 - 4.0 K/uL   Monocytes Relative 7 %   Monocytes Absolute 0.6 0.1 - 1.0 K/uL   Eosinophils Relative  0 %   Eosinophils Absolute 0.0 0.0 - 0.5 K/uL   Basophils Relative 1 %   Basophils Absolute 0.1 0.0 - 0.1 K/uL   Immature Granulocytes 0 %   Abs Immature Granulocytes 0.03 0.00 - 0.07 K/uL    Comment: Performed at [facility], [address], [place], [name] [address]  Basic metabolic panel     Status: Abnormal   Collection Time: 10/31/19  1:17 PM  Result Value Ref Range   Sodium 132 (L) 135 - 145 mmol/L   Potassium 4.2 3.5 - 5.1 mmol/L   Chloride 100 98 - 111 mmol/L   CO2 23 22 - 32 mmol/L   Glucose, Bld 155 (H) 70 - 99 mg/dL    Comment: Glucose reference range applies only to samples taken after fasting for at least 8 hours.   BUN 14 8 - 23 mg/dL   Creatinine, Ser 0.58 (L) 0.61 - 1.24 mg/dL   Calcium 8.8 (L) 8.9 - 10.3 mg/dL   GFR calc non Af Amer >60 >60 mL/min   GFR calc Af Amer >60 >60 mL/min   Anion gap 9 5 - 15    Comment: Performed at [facility], [address] [place], [place] [address]  POC SARS Coronavirus 2 Ag     Status: None   Collection Time: 10/31/19  9:31 PM  Result Value Ref Range   SARS Coronavirus 2 Ag NEGATIVE NEGATIVE    Comment: (NOTE) SARS-CoV-2 antigen NOT DETECTED.  Negative results are presumptive.  Negative results do not preclude SARS-CoV-2 infection and should not be used as the sole basis for treatment or other patient management decisions, including infection  control decisions, particularly in the presence of clinical signs and  symptoms consistent with  COVID-19, or in those who have been in contact with the virus.  Negative results must be combined with clinical observations, patient history, and epidemiological information. The expected result is Negative. Fact Sheet for Patients: PodPark.tn Fact Sheet for Healthcare Providers: GiftContent.is This test is not yet approved or cleared by the [place] FDA and  has been authorized for detection and/or diagnosis of SARS-CoV-2 by FDA under an Emergency Use Authorization (EUA).  This EUA will remain in effect (meaning this test can be used) for the duration of  the COVID-19 de claration under Section 564(b)(1) of the Act, 21 U.S.C. section 360bbb-3(b)(1), unless the authorization is terminated or revoked sooner.   Lactate dehydrogenase     Status: Abnormal   Collection Time: 11/23/19 12:22 PM  Result Value Ref Range   LDH 422 (H) 98 - 192 U/L    Comment: Performed at [facility], [address] [place], [place] [address]  CEA     Status: Abnormal   Collection Time: 11/23/19 12:22 PM  Result Value Ref Range   CEA 2,113.0 (H) 0.0 - 4.7 ng/mL    Comment: (NOTE) Results confirmed on dilution.                             Nonsmokers          <3.9                             Smokers             <5.6 Roche [employer]ostics Electrochemiluminescence Immunoassay (ECLIA) Values obtained with different assay methods or kits cannot be used interchangeably.  Results cannot be interpreted as absolute evidence of the presence or absence of malignant disease. Performed At: [facility] [place], [place] [id number] Rush Farmer MD [id number]   Comprehensive metabolic panel     Status: Abnormal   Collection Time: 11/23/19 12:22 PM  Result Value Ref Range   Sodium 136 135 - 145 mmol/L   Potassium 4.2 3.5 - 5.1 mmol/L   Chloride 101 98 - 111 mmol/L   CO2 26 22 - 32 mmol/L   Glucose, Bld 141 (H) 70 - 99  mg/dL    Comment: Glucose reference range applies only to samples taken after fasting for at least 8 hours.   BUN 22 8 - 23 mg/dL   Creatinine, Ser 0.59 (L) 0.61 - 1.24 mg/dL   Calcium 8.2 (L) 8.9 - 10.3 mg/dL   Total Protein 6.4 (L) 6.5 - 8.1 g/dL   Albumin 2.8 (L) 3.5 - 5.0 g/dL   AST 33 15 - 41 U/L   ALT 39 0 - 44 U/L   Alkaline Phosphatase 244 (H) 38 - 126 U/L   Total Bilirubin 0.5 0.3 - 1.2 mg/dL   GFR calc non Af Amer >60 >60 mL/min   GFR calc Af Amer >60 >60 mL/min   Anion gap 9 5 - 15    Comment: Performed at [facility], [address] [place], [place] [address]  CBC with Differential/Platelet     Status: Abnormal   Collection Time: 11/23/19 12:22 PM  Result Value Ref Range   WBC 7.1 4.0 - 10.5 K/uL   RBC 3.67 (L) 4.22 - 5.81 MIL/uL   Hemoglobin 11.0 (L) 13.0 - 17.0 g/dL   HCT 33.8 (L) 39.0 - 52.0 %   MCV 92.1 80.0 - 100.0 fL   MCH 30.0 26.0 - 34.0 pg   MCHC 32.5 30.0 - 36.0 g/dL   RDW 16.6 (H) 11.5 - 15.5 %   Platelets 336 150 - 400 K/uL   nRBC 0.0 0.0 - 0.2 %   Neutrophils Relative % 75 %   Neutro Abs 5.2 1.7 - 7.7 K/uL   Lymphocytes Relative 13 %   Lymphs Abs 1.0 0.7 - 4.0 K/uL   Monocytes Relative 10 %   Monocytes Absolute 0.7 0.1 - 1.0 K/uL   Eosinophils Relative 1 %   Eosinophils Absolute 0.1 0.0 - 0.5 K/uL   Basophils Relative 0 %   Basophils Absolute 0.0 0.0 - 0.1 K/uL   Immature Granulocytes 1 %   Abs Immature Granulocytes 0.09 (H) 0.00 - 0.07 K/uL    Comment: Performed at [facility], [address] [place], [place] [address]  CBC     Status: Abnormal   Collection Time: 11/29/19  2:47 AM  Result Value Ref Range   WBC 8.1 4.0 - 10.5 K/uL   RBC 4.13 (L) 4.22 - 5.81 MIL/uL   Hemoglobin 12.3 (L) 13.0 - 17.0 g/dL   HCT 38.1 (L) 39.0 - 52.0 %   MCV 92.3 80.0 - 100.0 fL   MCH 29.8 26.0 - 34.0 pg   MCHC 32.3 30.0 - 36.0 g/dL   RDW 16.3 (H) 11.5 - 15.5 %   Platelets 434 (H) 150 - 400 K/uL   nRBC 0.0 0.0 - 0.2 %    Comment: Performed at  [facility], [address] [place], [place] [address]  Comprehensive metabolic panel     Status: Abnormal   Collection Time: 11/29/19  2:47 AM  Result Value Ref Range  Sodium 134 (L) 135 - 145 mmol/L   Potassium 4.1 3.5 - 5.1 mmol/L   Chloride 97 (L) 98 - 111 mmol/L   CO2 27 22 - 32 mmol/L   Glucose, Bld 108 (H) 70 - 99 mg/dL    Comment: Glucose reference range applies only to samples taken after fasting for at least 8 hours.   BUN 18 8 - 23 mg/dL   Creatinine, Ser 0.62 0.61 - 1.24 mg/dL   Calcium 8.8 (L) 8.9 - 10.3 mg/dL   Total Protein 7.4 6.5 - 8.1 g/dL   Albumin 3.3 (L) 3.5 - 5.0 g/dL   AST 42 (H) 15 - 41 U/L   ALT 35 0 - 44 U/L   Alkaline Phosphatase 266 (H) 38 - 126 U/L   Total Bilirubin 0.7 0.3 - 1.2 mg/dL   GFR calc non Af Amer >60 >60 mL/min   GFR calc Af Amer >60 >60 mL/min   Anion gap 10 5 - 15    Comment: Performed at [facility], [place], [place] [address]  Troponin I (High Sensitivity)     Status: None   Collection Time: 11/29/19  2:47 AM  Result Value Ref Range   Troponin I (High Sensitivity) 7 <18 ng/L    Comment: (NOTE) Elevated high sensitivity troponin I (hsTnI) values and significant  changes across serial measurements may suggest ACS but many other  chronic and acute conditions are known to elevate hsTnI results.  Refer to the "Links" section for chest pain algorithms and additional  guidance. Performed at [facility], [address] [place], [place] [address]   Brain natriuretic peptide     Status: None   Collection Time: 11/29/19  2:47 AM  Result Value Ref Range   B Natriuretic Peptide 68.0 0.0 - 100.0 pg/mL    Comment: Performed at [facility], [address] [place], [place] [address]  Respiratory Panel by RT PCR (Flu A&B, Covid) - Nasopharyngeal Swab     Status: None   Collection Time: 11/29/19  2:59 AM   Specimen: Nasopharyngeal Swab  Result Value Ref Range   SARS Coronavirus 2 by  RT PCR NEGATIVE NEGATIVE    Comment: (NOTE) SARS-CoV-2 target nucleic acids are NOT DETECTED. The SARS-CoV-2 RNA is generally detectable in upper respiratoy specimens during the acute phase of infection. The lowest concentration of SARS-CoV-2 viral copies this assay can detect is 131 copies/mL. A negative result does not preclude SARS-Cov-2 infection and should not be used as the sole basis for treatment or other patient management decisions. A negative result may occur with  improper specimen collection/handling, submission of specimen other than nasopharyngeal swab, presence of viral mutation(s) within the areas targeted by this assay, and inadequate number of viral copies (<131 copies/mL). A negative result must be combined with clinical observations, patient history, and epidemiological information. The expected result is Negative. Fact Sheet for Patients:  PinkCheek.be Fact Sheet for Healthcare Providers:  GravelBags.it This test is not yet ap proved or cleared by the [place] FDA and  has been authorized for detection and/or diagnosis of SARS-CoV-2 by FDA under an Emergency Use Authorization (EUA). This EUA will remain  in effect (meaning this test can be used) for the duration of the COVID-19 declaration under Section 564(b)(1) of the Act, 21 U.S.C. section 360bbb-3(b)(1), unless the authorization is terminated or revoked sooner.    Influenza A by PCR NEGATIVE NEGATIVE   Influenza B by PCR NEGATIVE NEGATIVE  Comment: (NOTE) The Xpert Xpress SARS-CoV-2/FLU/RSV assay is intended as an aid in  the diagnosis of influenza from Nasopharyngeal swab specimens and  should not be used as a sole basis for treatment. Nasal washings and  aspirates are unacceptable for Xpert Xpress SARS-CoV-2/FLU/RSV  testing. Fact Sheet for Patients: PinkCheek.be Fact Sheet for Healthcare  Providers: GravelBags.it This test is not yet approved or cleared by the [place] FDA and  has been authorized for detection and/or diagnosis of SARS-CoV-2 by  FDA under an Emergency Use Authorization (EUA). This EUA will remain  in effect (meaning this test can be used) for the duration of the  Covid-19 declaration under Section 564(b)(1) of the Act, 21  U.S.C. section 360bbb-3(b)(1), unless the authorization is  terminated or revoked. Performed at [facility], [address] [place], [place] [address]   HIV Antibody (routine testing w rflx)     Status: None   Collection Time: 11/29/19  6:34 AM  Result Value Ref Range   HIV Screen 4th Generation wRfx Non Reactive Non Reactive    Comment: Performed at [facility], [place] [address], [place], [place] [address]  Comprehensive metabolic panel     Status: Abnormal   Collection Time: 11/30/19  6:00 AM  Result Value Ref Range   Sodium 132 (L) 135 - 145 mmol/L   Potassium 4.8 3.5 - 5.1 mmol/L   Chloride 98 98 - 111 mmol/L   CO2 31 22 - 32 mmol/L   Glucose, Bld 162 (H) 70 - 99 mg/dL    Comment: Glucose reference range applies only to samples taken after fasting for at least 8 hours.   BUN 15 8 - 23 mg/dL   Creatinine, Ser 0.39 (L) 0.61 - 1.24 mg/dL   Calcium 8.6 (L) 8.9 - 10.3 mg/dL   Total Protein 6.1 (L) 6.5 - 8.1 g/dL   Albumin 2.6 (L) 3.5 - 5.0 g/dL   AST 26 15 - 41 U/L   ALT 28 0 - 44 U/L   Alkaline Phosphatase 197 (H) 38 - 126 U/L   Total Bilirubin 0.6 0.3 - 1.2 mg/dL   GFR calc non Af Amer >60 >60 mL/min   GFR calc Af Amer >60 >60 mL/min   Anion gap 3 (L) 5 - 15    Comment: Performed at [facility], [address] [place], [place] [address]  CBC     Status: Abnormal   Collection Time: 11/30/19  6:00 AM  Result Value Ref Range   WBC 9.4 4.0 - 10.5 K/uL   RBC 3.55 (L) 4.22 - 5.81 MIL/uL   Hemoglobin 10.7 (L) 13.0 - 17.0 g/dL   HCT 33.2 (L) 39.0 - 52.0 %   MCV 93.5  80.0 - 100.0 fL   MCH 30.1 26.0 - 34.0 pg   MCHC 32.2 30.0 - 36.0 g/dL   RDW 15.8 (H) 11.5 - 15.5 %   Platelets 429 (H) 150 - 400 K/uL   nRBC 0.0 0.0 - 0.2 %    Comment: Performed at [facility], [address] [place],  [address]  Glucose, capillary     Status: None   Collection Time: 12/06/19  9:12 AM  Result Value Ref Range   Glucose-Capillary 96 70 - 99 mg/dL    Comment: Glucose reference range applies only to samples taken after fasting for at least 8 hours.      PHQ2/9: Depression screen [facility] 2/9 12/08/2019 11/25/2019 10/21/2019 03/16/2017 10/10/2015  Decreased Interest 1  0 0 0 0  Down, Depressed, Hopeless 1 0 1 0 0  PHQ - 2 Score 2 0 1 0 0  Altered sleeping 2 0 0 - -  Tired, decreased energy 1 0 0 - -  Change in appetite 0 0 0 - -  Feeling bad or failure about yourself  1 0 0 - -  Trouble concentrating 0 0 0 - -  Moving slowly or fidgety/restless 0 0 0 - -  Suicidal thoughts 0 0 0 - -  PHQ-9 Score 6 0 1 - -  Difficult doing work/chores Not difficult at all - Not difficult at all - -    phq 9 is positive   Fall Risk: Fall Risk  12/08/2019 11/25/2019 10/21/2019 10/21/2019 03/16/2017  Falls in the past year? 0 0 0 0 No  Number falls in past yr: 0 0 0 0 -  Injury with Fall? 0 0 0 0 -     Functional Status Survey: Is the patient deaf or have difficulty hearing?: No Does the patient have difficulty seeing, even when wearing glasses/contacts?: No Does the patient have difficulty concentrating, remembering, or making decisions?: No Does the patient have difficulty walking or climbing stairs?: Yes Does the patient have difficulty dressing or bathing?: Yes Does the patient have difficulty doing errands alone such as visiting a doctor's office or shopping?: Yes    Assessment & Plan  1. Hospital discharge follow-up  Reviewed notes and labs done during his visit  2. Moderate protein-calorie malnutrition ([place])  He has gained some weight, son is cooking for  him now, trying to add high calorie food , he has seen a dietician  3. 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 Metastatic cancer to spine ([place])  No pain, still has below waist paresthesia, able to walk with assistance  5. Anxiety  - escitalopram (LEXAPRO) 5 MG tablet; Take 1 tablet (5 mg total) by mouth daily. am's  Dispense: 30 tablet; Refill: 0  6. Dysthymia  From recent cancer diagnosis, really upset since yesterday when was given prognosis and found out not a candidate for chemo, he is willing to take lexapro. Discussed counseling, being in the moment when doing something he enjoys and okay to feel sad and upset. Rely on his family that has been so supportive to him  - escitalopram (LEXAPRO) 5 MG tablet; Take 1 tablet (5 mg total) by mouth daily. am's  Dispense: 30 tablet; Refill: 0  7. Insomnia, unspecified type  We will stop BZD at this time and try seroquel that may help with weight gain and sleep - QUEtiapine (SEROQUEL) 25 MG tablet; Take 1 tablet (25 mg total) by mouth at bedtime.  Dispense: 30 tablet; Refill: 0

## 2019-12-08 NOTE — Telephone Encounter (Signed)
Patient contacted to assure understanding of education and treatment plan. Patient verbalized understanding and was able to verify future appointment. Patient encouraged to contact Radiation Oncology Department should questions arise.

## 2019-12-09 ENCOUNTER — Telehealth: Payer: Self-pay | Admitting: Family Medicine

## 2019-12-09 DIAGNOSIS — M47816 Spondylosis without myelopathy or radiculopathy, lumbar region: Secondary | ICD-10-CM | POA: Diagnosis not present

## 2019-12-09 DIAGNOSIS — R69 Illness, unspecified: Secondary | ICD-10-CM | POA: Diagnosis not present

## 2019-12-09 DIAGNOSIS — Z483 Aftercare following surgery for neoplasm: Secondary | ICD-10-CM | POA: Diagnosis not present

## 2019-12-09 DIAGNOSIS — M47812 Spondylosis without myelopathy or radiculopathy, cervical region: Secondary | ICD-10-CM | POA: Diagnosis not present

## 2019-12-09 DIAGNOSIS — M4802 Spinal stenosis, cervical region: Secondary | ICD-10-CM | POA: Diagnosis not present

## 2019-12-09 DIAGNOSIS — N400 Enlarged prostate without lower urinary tract symptoms: Secondary | ICD-10-CM | POA: Diagnosis not present

## 2019-12-09 DIAGNOSIS — M8458XD Pathological fracture in neoplastic disease, other specified site, subsequent encounter for fracture with routine healing: Secondary | ICD-10-CM | POA: Diagnosis not present

## 2019-12-09 DIAGNOSIS — C7951 Secondary malignant neoplasm of bone: Secondary | ICD-10-CM | POA: Diagnosis not present

## 2019-12-09 DIAGNOSIS — J449 Chronic obstructive pulmonary disease, unspecified: Secondary | ICD-10-CM | POA: Diagnosis not present

## 2019-12-09 DIAGNOSIS — C801 Malignant (primary) neoplasm, unspecified: Secondary | ICD-10-CM | POA: Diagnosis not present

## 2019-12-09 NOTE — Telephone Encounter (Signed)
Copied from [place] [phone]. Topic: General - Inquiry >> Dec 08, 2019  2:02 PM Greggory Keen D wrote: Reason for CRM: Sela Hua with [facility] called to say patient didn't get to do his PT yesterday  Pt was too tired.

## 2019-12-09 NOTE — Telephone Encounter (Signed)
Home Health Verbal Orders - Caller/[place]  [facility] Number: [phone]  Requesting OT/PT/Skilled Nursing/Social Work/Speech Therapy: OT 1x week for 5 weeks

## 2019-12-11 ENCOUNTER — Telehealth: Payer: Self-pay | Admitting: Internal Medicine

## 2019-12-11 DIAGNOSIS — Z7189 Other specified counseling: Secondary | ICD-10-CM | POA: Insufficient documentation

## 2019-12-11 NOTE — Telephone Encounter (Signed)
Cody [name]-while awaiting on the NGS; I would recommend starting the patient on single agent Keytruda.  Please discussed with the patient/caregiver regarding starting treatment next visit.  If interested; please let me know/schedule at next visit. I will be happy to to talk to them.

## 2019-12-11 NOTE — Progress Notes (Signed)
START ON PATHWAY REGIMEN - Non-Small Cell Lung     A cycle is every 21 days:     Pembrolizumab      Pemetrexed      Carboplatin   **Always confirm dose/schedule in your pharmacy ordering system**  Patient Characteristics: Stage IV Metastatic, Nonsquamous, Initial Chemotherapy/Immunotherapy, PS = 0, 1, ALK Rearrangement Negative and ROS1 Rearrangement Negative and NTRK Gene Fusion?Negative and RET Gene Fusion?Negative and EGFR Mutation Negative/Non?Sensitizing, PD-L1  Expression Positive 1-49% (TPS) / Negative / Not Tested / Awaiting Test Results and Immunotherapy Candidate Therapeutic Status: Stage IV Metastatic Histology: Nonsquamous Cell ROS1 Rearrangement Status: Negative Other Mutations/Biomarkers: No Other Actionable Mutations NTRK Gene Fusion Status: Negative PD-L1 Expression Status: PD-L1 Positive 1-49% (TPS) Chemotherapy/Immunotherapy LOT: Initial Chemotherapy/Immunotherapy Molecular Targeted Therapy: Not Appropriate MET Exon 14 Mutation Status: Negative RET Gene Fusion Status: Negative ALK Rearrangement Status: Negative EGFR Mutation Status: Non-Sensitizing BRAF V600E Mutation Status: Negative ECOG Performance Status: 1 Biomarker Assessment Status Confirmation: All Genomic Markers Negative or Only MET+ or BRAF+ Immunotherapy Candidate Status: Candidate for Immunotherapy Intent of Therapy: Non-Curative / Palliative Intent, Discussed with Patient 

## 2019-12-12 DIAGNOSIS — C787 Secondary malignant neoplasm of liver and intrahepatic bile duct: Secondary | ICD-10-CM | POA: Diagnosis not present

## 2019-12-12 DIAGNOSIS — C7951 Secondary malignant neoplasm of bone: Secondary | ICD-10-CM | POA: Diagnosis not present

## 2019-12-12 DIAGNOSIS — Z510 Encounter for antineoplastic radiation therapy: Secondary | ICD-10-CM | POA: Diagnosis not present

## 2019-12-12 DIAGNOSIS — C3411 Malignant neoplasm of upper lobe, right bronchus or lung: Secondary | ICD-10-CM | POA: Diagnosis not present

## 2019-12-12 NOTE — Telephone Encounter (Signed)
Is it ok to sent in a verbal for orders

## 2019-12-12 NOTE — Telephone Encounter (Signed)
Pt will start radiation on 5/19. Do you want him to receive Keytruda while receiving radiation treatments? His last radiation is scheduled for 12/28/19.

## 2019-12-13 NOTE — Telephone Encounter (Signed)
Left message for Stephaine at [facility]

## 2019-12-14 ENCOUNTER — Ambulatory Visit
Admission: RE | Admit: 2019-12-14 | Discharge: 2019-12-14 | Disposition: A | Discharge status: Home / Self Care | Payer: Medicare HMO | Source: Ambulatory Visit | Attending: Radiation Oncology | Admitting: Radiation Oncology

## 2019-12-14 DIAGNOSIS — C7949 Secondary malignant neoplasm of other parts of nervous system: Secondary | ICD-10-CM | POA: Diagnosis not present

## 2019-12-14 DIAGNOSIS — C3411 Malignant neoplasm of upper lobe, right bronchus or lung: Secondary | ICD-10-CM | POA: Diagnosis not present

## 2019-12-14 DIAGNOSIS — Z510 Encounter for antineoplastic radiation therapy: Secondary | ICD-10-CM | POA: Diagnosis not present

## 2019-12-14 DIAGNOSIS — C787 Secondary malignant neoplasm of liver and intrahepatic bile duct: Secondary | ICD-10-CM | POA: Diagnosis not present

## 2019-12-14 DIAGNOSIS — C7951 Secondary malignant neoplasm of bone: Secondary | ICD-10-CM | POA: Diagnosis not present

## 2019-12-14 NOTE — Telephone Encounter (Signed)
Message left with [facility] to get an update on results for Foundation One. Awaiting call back.

## 2019-12-14 NOTE — Addendum Note (Signed)
Addended by: Telford Nab on: 12/14/2019 01:46 PM   Modules accepted: Orders

## 2019-12-15 ENCOUNTER — Ambulatory Visit
Admission: RE | Admit: 2019-12-15 | Discharge: 2019-12-15 | Disposition: A | Discharge status: Home / Self Care | Payer: Medicare HMO | Source: Ambulatory Visit | Attending: Radiation Oncology | Admitting: Radiation Oncology

## 2019-12-15 DIAGNOSIS — R69 Illness, unspecified: Secondary | ICD-10-CM | POA: Diagnosis not present

## 2019-12-15 DIAGNOSIS — M47812 Spondylosis without myelopathy or radiculopathy, cervical region: Secondary | ICD-10-CM | POA: Diagnosis not present

## 2019-12-15 DIAGNOSIS — Z510 Encounter for antineoplastic radiation therapy: Secondary | ICD-10-CM | POA: Diagnosis not present

## 2019-12-15 DIAGNOSIS — N400 Enlarged prostate without lower urinary tract symptoms: Secondary | ICD-10-CM | POA: Diagnosis not present

## 2019-12-15 DIAGNOSIS — J449 Chronic obstructive pulmonary disease, unspecified: Secondary | ICD-10-CM | POA: Diagnosis not present

## 2019-12-15 DIAGNOSIS — C7951 Secondary malignant neoplasm of bone: Secondary | ICD-10-CM | POA: Diagnosis not present

## 2019-12-15 DIAGNOSIS — Z483 Aftercare following surgery for neoplasm: Secondary | ICD-10-CM | POA: Diagnosis not present

## 2019-12-15 DIAGNOSIS — M8458XD Pathological fracture in neoplastic disease, other specified site, subsequent encounter for fracture with routine healing: Secondary | ICD-10-CM | POA: Diagnosis not present

## 2019-12-15 DIAGNOSIS — M47816 Spondylosis without myelopathy or radiculopathy, lumbar region: Secondary | ICD-10-CM | POA: Diagnosis not present

## 2019-12-15 DIAGNOSIS — M4802 Spinal stenosis, cervical region: Secondary | ICD-10-CM | POA: Diagnosis not present

## 2019-12-15 DIAGNOSIS — C801 Malignant (primary) neoplasm, unspecified: Secondary | ICD-10-CM | POA: Diagnosis not present

## 2019-12-15 NOTE — Telephone Encounter (Signed)
Received follow up call from [facility] to inform that they have NOT sent tissue out for Foundation One testing. Per Eloise Levels nurse, they are needing patient to sign ABN before proceeding with Foundation One testing. Discussed with Dr. Rogue Bussing and informed to go ahead and send Foundation One request on sample at [facility] since pt unable to drive to [facility] for signature. If signature needed on the Foundation One ordered by Dr. B then will get pt to sign since receiving daily treatments here. Foundation One ordered electronically and [facility] notified so they can cancel their request.

## 2019-12-15 NOTE — Telephone Encounter (Signed)
Foundation One results are still in process at this time. Will call back again next week to follow up.

## 2019-12-16 ENCOUNTER — Inpatient Hospital Stay: Payer: Medicare HMO | Admitting: Family Medicine

## 2019-12-16 ENCOUNTER — Ambulatory Visit
Admission: RE | Admit: 2019-12-16 | Discharge: 2019-12-16 | Disposition: A | Discharge status: Home / Self Care | Payer: Medicare HMO | Source: Ambulatory Visit | Attending: Radiation Oncology | Admitting: Radiation Oncology

## 2019-12-16 DIAGNOSIS — Z510 Encounter for antineoplastic radiation therapy: Secondary | ICD-10-CM | POA: Diagnosis not present

## 2019-12-19 ENCOUNTER — Other Ambulatory Visit: Payer: Self-pay | Admitting: *Deleted

## 2019-12-19 ENCOUNTER — Inpatient Hospital Stay: Payer: Medicare HMO | Admitting: Oncology

## 2019-12-19 ENCOUNTER — Encounter: Payer: Self-pay | Admitting: *Deleted

## 2019-12-19 ENCOUNTER — Ambulatory Visit
Admission: RE | Admit: 2019-12-19 | Discharge: 2019-12-19 | Disposition: A | Discharge status: Home / Self Care | Payer: Medicare HMO | Source: Ambulatory Visit | Attending: Radiation Oncology | Admitting: Radiation Oncology

## 2019-12-19 ENCOUNTER — Inpatient Hospital Stay: Payer: Medicare HMO

## 2019-12-19 DIAGNOSIS — C3410 Malignant neoplasm of upper lobe, unspecified bronchus or lung: Secondary | ICD-10-CM | POA: Diagnosis not present

## 2019-12-19 DIAGNOSIS — Z510 Encounter for antineoplastic radiation therapy: Secondary | ICD-10-CM | POA: Diagnosis not present

## 2019-12-19 NOTE — Patient Instructions (Signed)
Pembrolizumab injection What is this medicine? PEMBROLIZUMAB (pem broe liz ue mab) is a monoclonal antibody. It is used to treat certain types of cancer. This medicine may be used for other purposes; ask your health care provider or pharmacist if you have questions. COMMON BRAND NAME(S): Keytruda What should I tell my health care provider before I take this medicine? They need to know if you have any of these conditions:  diabetes  immune system problems  inflammatory bowel disease  liver disease  lung or breathing disease  lupus  received or scheduled to receive an organ transplant or a stem-cell transplant that uses donor stem cells  an unusual or allergic reaction to pembrolizumab, other medicines, foods, dyes, or preservatives  pregnant or trying to get pregnant  breast-feeding How should I use this medicine? This medicine is for infusion into a vein. It is given by a health care professional in a hospital or clinic setting. A special MedGuide will be given to you before each treatment. Be sure to read this information carefully each time. Talk to your pediatrician regarding the use of this medicine in children. While this drug may be prescribed for children as young as 6 months for selected conditions, precautions do apply. Overdosage: If you think you have taken too much of this medicine contact a poison control center or emergency room at once. NOTE: This medicine is only for you. Do not share this medicine with others. What if I miss a dose? It is important not to miss your dose. Call your doctor or health care professional if you are unable to keep an appointment. What may interact with this medicine? Interactions have not been studied. Give your health care provider a list of all the medicines, herbs, non-prescription drugs, or dietary supplements you use. Also tell them if you smoke, drink alcohol, or use illegal drugs. Some items may interact with your medicine. This  list may not describe all possible interactions. Give your health care provider a list of all the medicines, herbs, non-prescription drugs, or dietary supplements you use. Also tell them if you smoke, drink alcohol, or use illegal drugs. Some items may interact with your medicine. What should I watch for while using this medicine? Your condition will be monitored carefully while you are receiving this medicine. You may need blood work done while you are taking this medicine. Do not become pregnant while taking this medicine or for 4 months after stopping it. Women should inform their doctor if they wish to become pregnant or think they might be pregnant. There is a potential for serious side effects to an unborn child. Talk to your health care professional or pharmacist for more information. Do not breast-feed an infant while taking this medicine or for 4 months after the last dose. What side effects may I notice from receiving this medicine? Side effects that you should report to your doctor or health care professional as soon as possible:  allergic reactions like skin rash, itching or hives, swelling of the face, lips, or tongue  bloody or black, tarry  breathing problems  changes in vision  chest pain  chills  confusion  constipation  cough  diarrhea  dizziness or feeling faint or lightheaded  fast or irregular heartbeat  fever  flushing  joint pain  low blood counts - this medicine may decrease the number of white blood cells, red blood cells and platelets. You may be at increased risk for infections and bleeding.  muscle pain  muscle   weakness  pain, tingling, numbness in the hands or feet  persistent headache  redness, blistering, peeling or loosening of the skin, including inside the mouth  signs and symptoms of high blood sugar such as dizziness; dry mouth; dry skin; fruity breath; nausea; stomach pain; increased hunger or thirst; increased urination  signs  and symptoms of kidney injury like trouble passing urine or change in the amount of urine  signs and symptoms of liver injury like dark urine, light-colored stools, loss of appetite, nausea, right upper belly pain, yellowing of the eyes or skin  sweating  swollen lymph nodes  weight loss Side effects that usually do not require medical attention (report to your doctor or health care professional if they continue or are bothersome):  decreased appetite  hair loss  muscle pain  tiredness This list may not describe all possible side effects. Call your doctor for medical advice about side effects. You may report side effects to FDA at 1-800-FDA-1088. Where should I keep my medicine? This drug is given in a hospital or clinic and will not be stored at home. NOTE: This sheet is a summary. It may not cover all possible information. If you have questions about this medicine, talk to your doctor, pharmacist, or health care provider.  2020 Elsevier/Gold Standard (2019-05-20 18:07:58) Pemetrexed injection What is this medicine? PEMETREXED (PEM e TREX ed) is a chemotherapy drug used to treat lung cancers like non-small cell lung cancer and mesothelioma. It may also be used to treat other cancers. This medicine may be used for other purposes; ask your health care provider or pharmacist if you have questions. COMMON BRAND NAME(S): Alimta What should I tell my health care provider before I take this medicine? They need to know if you have any of these conditions:  infection (especially a virus infection such as chickenpox, cold sores, or herpes)  kidney disease  low blood counts, like low white cell, platelet, or red cell counts  lung or breathing disease, like asthma  radiation therapy  an unusual or allergic reaction to pemetrexed, other medicines, foods, dyes, or preservative  pregnant or trying to get pregnant  breast-feeding How should I use this medicine? This drug is given as  an infusion into a vein. It is administered in a hospital or clinic by a specially trained health care professional. Talk to your pediatrician regarding the use of this medicine in children. Special care may be needed. Overdosage: If you think you have taken too much of this medicine contact a poison control center or emergency room at once. NOTE: This medicine is only for you. Do not share this medicine with others. What if I miss a dose? It is important not to miss your dose. Call your doctor or health care professional if you are unable to keep an appointment. What may interact with this medicine? This medicine may interact with the following medications:  Ibuprofen This list may not describe all possible interactions. Give your health care provider a list of all the medicines, herbs, non-prescription drugs, or dietary supplements you use. Also tell them if you smoke, drink alcohol, or use illegal drugs. Some items may interact with your medicine. What should I watch for while using this medicine? Visit your doctor for checks on your progress. This drug may make you feel generally unwell. This is not uncommon, as chemotherapy can affect healthy cells as well as cancer cells. Report any side effects. Continue your course of treatment even though you feel ill unless   your doctor tells you to stop. In some cases, you may be given additional medicines to help with side effects. Follow all directions for their use. Call your doctor or health care professional for advice if you get a fever, chills or sore throat, or other symptoms of a cold or flu. Do not treat yourself. This drug decreases your body's ability to fight infections. Try to avoid being around people who are sick. This medicine may increase your risk to bruise or bleed. Call your doctor or health care professional if you notice any unusual bleeding. Be careful brushing and flossing your teeth or using a toothpick because you may get an  infection or bleed more easily. If you have any dental work done, tell your dentist you are receiving this medicine. Avoid taking products that contain aspirin, acetaminophen, ibuprofen, naproxen, or ketoprofen unless instructed by your doctor. These medicines may hide a fever. Call your doctor or health care professional if you get diarrhea or mouth sores. Do not treat yourself. To protect your kidneys, drink water or other fluids as directed while you are taking this medicine. Do not become pregnant while taking this medicine or for 6 months after stopping it. Women should inform their doctor if they wish to become pregnant or think they might be pregnant. Men should not father a child while taking this medicine and for 3 months after stopping it. This may interfere with the ability to father a child. You should talk to your doctor or health care professional if you are concerned about your fertility. There is a potential for serious side effects to an unborn child. Talk to your health care professional or pharmacist for more information. Do not breast-feed an infant while taking this medicine or for 1 week after stopping it. What side effects may I notice from receiving this medicine? Side effects that you should report to your doctor or health care professional as soon as possible:  allergic reactions like skin rash, itching or hives, swelling of the face, lips, or tongue  breathing problems  redness, blistering, peeling or loosening of the skin, including inside the mouth  signs and symptoms of bleeding such as bloody or black, tarry stools; red or dark-brown urine; spitting up blood or brown material that looks like coffee grounds; red spots on the skin; unusual bruising or bleeding from the eye, gums, or nose  signs and symptoms of infection like fever or chills; cough; sore throat; pain or trouble passing urine  signs and symptoms of kidney injury like trouble passing urine or change in the  amount of urine  signs and symptoms of liver injury like dark yellow or brown urine; general ill feeling or flu-like symptoms; light-colored stools; loss of appetite; nausea; right upper belly pain; unusually weak or tired; yellowing of the eyes or skin Side effects that usually do not require medical attention (report to your doctor or health care professional if they continue or are bothersome):  constipation  mouth sores  nausea, vomiting  unusually weak or tired This list may not describe all possible side effects. Call your doctor for medical advice about side effects. You may report side effects to FDA at 1-800-FDA-1088. Where should I keep my medicine? This drug is given in a hospital or clinic and will not be stored at home. NOTE: This sheet is a summary. It may not cover all possible information. If you have questions about this medicine, talk to your doctor, pharmacist, or health care provider.  2020   Elsevier/Gold Standard (2017-09-02 16:11:33) Carboplatin injection What is this medicine? CARBOPLATIN (KAR boe pla tin) is a chemotherapy drug. It targets fast dividing cells, like cancer cells, and causes these cells to die. This medicine is used to treat ovarian cancer and many other cancers. This medicine may be used for other purposes; ask your health care provider or pharmacist if you have questions. COMMON BRAND NAME(S): Paraplatin What should I tell my health care provider before I take this medicine? They need to know if you have any of these conditions:  blood disorders  hearing problems  kidney disease  recent or ongoing radiation therapy  an unusual or allergic reaction to carboplatin, cisplatin, other chemotherapy, other medicines, foods, dyes, or preservatives  pregnant or trying to get pregnant  breast-feeding How should I use this medicine? This drug is usually given as an infusion into a vein. It is administered in a hospital or clinic by a specially  trained health care professional. Talk to your pediatrician regarding the use of this medicine in children. Special care may be needed. Overdosage: If you think you have taken too much of this medicine contact a poison control center or emergency room at once. NOTE: This medicine is only for you. Do not share this medicine with others. What if I miss a dose? It is important not to miss a dose. Call your doctor or health care professional if you are unable to keep an appointment. What may interact with this medicine?  medicines for seizures  medicines to increase blood counts like filgrastim, pegfilgrastim, sargramostim  some antibiotics like amikacin, gentamicin, neomycin, streptomycin, tobramycin  vaccines Talk to your doctor or health care professional before taking any of these medicines:  acetaminophen  aspirin  ibuprofen  ketoprofen  naproxen This list may not describe all possible interactions. Give your health care provider a list of all the medicines, herbs, non-prescription drugs, or dietary supplements you use. Also tell them if you smoke, drink alcohol, or use illegal drugs. Some items may interact with your medicine. What should I watch for while using this medicine? Your condition will be monitored carefully while you are receiving this medicine. You will need important blood work done while you are taking this medicine. This drug may make you feel generally unwell. This is not uncommon, as chemotherapy can affect healthy cells as well as cancer cells. Report any side effects. Continue your course of treatment even though you feel ill unless your doctor tells you to stop. In some cases, you may be given additional medicines to help with side effects. Follow all directions for their use. Call your doctor or health care professional for advice if you get a fever, chills or sore throat, or other symptoms of a cold or flu. Do not treat yourself. This drug decreases your body's  ability to fight infections. Try to avoid being around people who are sick. This medicine may increase your risk to bruise or bleed. Call your doctor or health care professional if you notice any unusual bleeding. Be careful brushing and flossing your teeth or using a toothpick because you may get an infection or bleed more easily. If you have any dental work done, tell your dentist you are receiving this medicine. Avoid taking products that contain aspirin, acetaminophen, ibuprofen, naproxen, or ketoprofen unless instructed by your doctor. These medicines may hide a fever. Do not become pregnant while taking this medicine. Women should inform their doctor if they wish to become pregnant or think they might   be pregnant. There is a potential for serious side effects to an unborn child. Talk to your health care professional or pharmacist for more information. Do not breast-feed an infant while taking this medicine. What side effects may I notice from receiving this medicine? Side effects that you should report to your doctor or health care professional as soon as possible:  allergic reactions like skin rash, itching or hives, swelling of the face, lips, or tongue  signs of infection - fever or chills, cough, sore throat, pain or difficulty passing urine  signs of decreased platelets or bleeding - bruising, pinpoint red spots on the skin, black, tarry stools, nosebleeds  signs of decreased red blood cells - unusually weak or tired, fainting spells, lightheadedness  breathing problems  changes in hearing  changes in vision  chest pain  high blood pressure  low blood counts - This drug may decrease the number of white blood cells, red blood cells and platelets. You may be at increased risk for infections and bleeding.  nausea and vomiting  pain, swelling, redness or irritation at the injection site  pain, tingling, numbness in the hands or feet  problems with balance, talking,  walking  trouble passing urine or change in the amount of urine Side effects that usually do not require medical attention (report to your doctor or health care professional if they continue or are bothersome):  hair loss  loss of appetite  metallic taste in the mouth or changes in taste This list may not describe all possible side effects. Call your doctor for medical advice about side effects. You may report side effects to FDA at 1-800-FDA-1088. Where should I keep my medicine? This drug is given in a hospital or clinic and will not be stored at home. NOTE: This sheet is a summary. It may not cover all possible information. If you have questions about this medicine, talk to your doctor, pharmacist, or health care provider.  2020 Elsevier/Gold Standard (2007-10-19 14:38:05)  

## 2019-12-20 ENCOUNTER — Ambulatory Visit
Admission: RE | Admit: 2019-12-20 | Discharge: 2019-12-20 | Disposition: A | Discharge status: Home / Self Care | Payer: Medicare HMO | Source: Ambulatory Visit | Attending: Radiation Oncology | Admitting: Radiation Oncology

## 2019-12-20 ENCOUNTER — Other Ambulatory Visit: Payer: Self-pay

## 2019-12-20 ENCOUNTER — Inpatient Hospital Stay: Payer: Medicare HMO

## 2019-12-20 ENCOUNTER — Inpatient Hospital Stay (HOSPITAL_BASED_OUTPATIENT_CLINIC_OR_DEPARTMENT_OTHER): Payer: Medicare HMO | Admitting: Oncology

## 2019-12-20 DIAGNOSIS — Z510 Encounter for antineoplastic radiation therapy: Secondary | ICD-10-CM | POA: Diagnosis not present

## 2019-12-20 DIAGNOSIS — C3411 Malignant neoplasm of upper lobe, right bronchus or lung: Secondary | ICD-10-CM | POA: Diagnosis not present

## 2019-12-20 DIAGNOSIS — Z5112 Encounter for antineoplastic immunotherapy: Secondary | ICD-10-CM | POA: Diagnosis not present

## 2019-12-20 DIAGNOSIS — C787 Secondary malignant neoplasm of liver and intrahepatic bile duct: Secondary | ICD-10-CM | POA: Diagnosis not present

## 2019-12-20 DIAGNOSIS — C7951 Secondary malignant neoplasm of bone: Secondary | ICD-10-CM | POA: Diagnosis not present

## 2019-12-20 NOTE — Progress Notes (Signed)
[place]  Telephone:([phone] [phone]  Patient Care Team: Steele Sizer, MD as PCP - General (Family Medicine) Christene Lye, MD (General Surgery) Telford Nab, RN as Oncology Nurse Navigator   Name of the patient: Cody [name]  [id number]  05-22-1956   Date of visit: 12/20/19  Diagnosis- Lung Cancer   Chief complaint/Reason for visit- Initial Meeting for [facility], preparing for starting chemotherapy  Heme/Onc history:  Oncology History Overview Note  # April 2021- RUL lung cancer; liver met; spinal cord compression/ vertebral mets [[facility]]; [[place]]; LIVER Bx- Metastatic adenocarcinoma, consistent with lung primary.- TPS % Interpretation -PD-L1 IHC 10 LOW EXPRESSION POSITIVE   # 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   # NGS/MOLECULAR TESTS:   # PALLIATIVE CARE EVALUATION:  # PAIN MANAGEMENT:    DIAGNOSIS:   STAGE:         ;  GOALS:  CURRENT/MOST RECENT THERAPY :     Cancer of upper lobe of right lung ([place])  11/23/2019 Initial Diagnosis   Cancer of upper lobe of right lung ([place])   12/12/2019 -  Chemotherapy   The patient had PALONOSETRON HCL INJECTION 0.25 MG/5ML, 0.25 mg, Intravenous,  Once, 0 of 4 cycles PEMETREXED CHEMO IV INFUSION, 500 mg/m2, Intravenous,  Once, 0 of 6 cycles CARBOPLATIN CHEMO IV INFUSION ORDERABLE (BY AUC), , Intravenous,  Once, 0 of 4 cycles FOSAPREPITANT IV INFUSION 150 MG, 150 mg, Intravenous,  Once, 0 of 4 cycles PEMBROLIZUMAB CHEMO IV INFUSION, 200 mg, Intravenous, Once, 0 of 6 cycles  for chemotherapy treatment.      Interval history-  Cody [name] is a 64 yo male who presents to chemo care clinic today for initial meeting in preparation for starting chemotherapy. I introduced the chemo care clinic and we discussed that the role of the  clinic is to assist those who are at an increased risk of emergency room visits and/or complications during the course of chemotherapy treatment. We discussed that the increased risk takes into account factors such as age, performance status, and co-morbidities. We also discussed that for some, this might include barriers to care such as not having a primary care provider, lack of insurance/transportation, or not being able to afford medications. We discussed that the goal of the program is to help prevent unplanned ER visits and help reduce complications during chemotherapy. We do this by discussing specific risk factors to each individual and identifying ways that we can help improve these risk factors and reduce barriers to care.   ECOG FS:1 - Symptomatic but completely ambulatory  Review of systems- Review of Systems  Constitutional: Negative.  Negative for chills, fever, malaise/fatigue and weight loss.  HENT: Negative for congestion, ear pain and tinnitus.   Eyes: Negative.  Negative for blurred vision and double vision.  Respiratory: Positive for shortness of breath (chronic O2). Negative for cough and sputum production.   Cardiovascular: Negative.  Negative for chest pain, palpitations and leg swelling.  Gastrointestinal: Negative.  Negative for abdominal pain, constipation, diarrhea, nausea and vomiting.  Genitourinary: Negative for dysuria, frequency and urgency.  Musculoskeletal: Negative for back pain and falls.  Skin: Negative.  Negative for rash.  Neurological: Negative.  Negative for weakness and headaches.  Endo/Heme/Allergies: Negative.  Does not bruise/bleed easily.  Psychiatric/Behavioral: Negative.  Negative for depression. The patient is not nervous/anxious and does not have  insomnia.      Current treatment- Keytruda  No Known Allergies  Past Medical History:  Diagnosis Date  . Allergy   . COPD (chronic obstructive pulmonary disease) ([place])   . Prostate enlargement      Past Surgical History:  Procedure Laterality Date  . HERNIA REPAIR Bilateral 1982  . HERNIA REPAIR Right 1994  . LAMINECTOMY FOR EXCISION / EVACUATION INTRASPINAL LESION  11/07/2019   T1-T 6 at [place] History   Socioeconomic History  . Marital status: Married    Spouse name: Cody [name]   . Number of children: 4  . Years of education: Not on file  . Highest education level: Some college, no degree  Occupational History  . Occupation: dispatch and delivery   Tobacco Use  . Smoking status: Former Smoker    Packs/day: 2.00    Years: 30.00    Pack years: 60.00    Quit date: 07/29/2011    Years since quitting: 8.4  . Smokeless tobacco: Never Used  . Tobacco comment: pt vapes  Substance and Sexual Activity  . Alcohol use: Yes    Alcohol/week: 5.0 standard drinks    Types: 5 Standard drinks or equivalent per week    Comment: 1-2/day  . Drug use: No  . Sexual activity: Not Currently    Partners: Female  Other Topics Concern  . Not on file  Social History Narrative   Worked in warehouse/ quit smoking in 2015; beer 1/day; in [place]; with wife.    Social Determinants of Health   Financial Resource Strain: Low Risk   . Difficulty of Paying Living Expenses: Not hard at all  Food Insecurity: No Food Insecurity  . Worried About Charity fundraiser in the Last Year: Never true  . Ran Out of Food in the Last Year: Never true  Transportation Needs: No Transportation Needs  . Lack of Transportation (Medical): No  . Lack of Transportation (Non-Medical): No  Physical Activity: Insufficiently Active  . Days of Exercise per Week: 7 days  . Minutes of Exercise per Session: 20 min  Stress: No Stress Concern Present  . Feeling of Stress : Not at all  Social Connections: Not Isolated  . Frequency of Communication with Friends and Family: More than three times a week  . Frequency of Social Gatherings with Friends and Family: More than three times a week  . Attends Religious  Services: More than 4 times per year  . Active Member of Clubs or Organizations: Yes  . Attends Archivist Meetings: More than 4 times per year  . Marital Status: Married  Human resources officer Violence: Not At Risk  . Fear of Current or Ex-Partner: No  . Emotionally Abused: No  . Physically Abused: No  . Sexually Abused: No    Family History  Problem Relation Age of Onset  . Heart disease Father   . CVA Mother   . Lung cancer Sister   . Lung cancer Brother      Current Outpatient Medications:  .  acetaminophen (TYLENOL) 500 MG tablet, Take 500-1,000 mg by mouth every 6 (six) hours as needed for mild pain or fever. , Disp: , Rfl:  .  escitalopram (LEXAPRO) 5 MG tablet, Take 1 tablet (5 mg total) by mouth daily. am's, Disp: 30 tablet, Rfl: 0 .  Fluticasone-Umeclidin-Vilant 100-62.5-25 MCG/INH AEPB, Inhale 1 puff into the lungs daily. , Disp: , Rfl:  .  Multiple Vitamins-Minerals (MULTIVITAMIN GUMMIES MENS PO), Take  1 Dose by mouth daily. , Disp: , Rfl:  .  ondansetron (ZOFRAN) 4 MG tablet, Take 1 tablet (4 mg total) by mouth every 8 (eight) hours as needed for nausea or vomiting., Disp: 20 tablet, Rfl: 2 .  QUEtiapine (SEROQUEL) 25 MG tablet, Take 1 tablet (25 mg total) by mouth at bedtime., Disp: 30 tablet, Rfl: 0  Physical exam: There were no vitals filed for this visit. Physical Exam Constitutional:      Appearance: Normal appearance.  HENT:     Head: Normocephalic and atraumatic.  Eyes:     Pupils: Pupils are equal, round, and reactive to light.  Cardiovascular:     Rate and Rhythm: Normal rate and regular rhythm.     Heart sounds: Normal heart sounds. No murmur.  Pulmonary:     Effort: Pulmonary effort is normal.     Breath sounds: Normal breath sounds. No wheezing.  Abdominal:     General: Bowel sounds are normal. There is no distension.     Palpations: Abdomen is soft.     Tenderness: There is no abdominal tenderness.  Musculoskeletal:        General: Normal  range of motion.     Cervical back: Normal range of motion.  Skin:    General: Skin is warm and dry.     Findings: No rash.  Neurological:     Mental Status: He is alert and oriented to person, place, and time.  Psychiatric:        Judgment: Judgment normal.      CMP Latest Ref Rng & Units 11/30/2019  Glucose 70 - 99 mg/dL 162(H)  BUN 8 - 23 mg/dL 15  Creatinine 0.61 - 1.24 mg/dL 0.39(L)  Sodium 135 - 145 mmol/L 132(L)  Potassium 3.5 - 5.1 mmol/L 4.8  Chloride 98 - 111 mmol/L 98  CO2 22 - 32 mmol/L 31  Calcium 8.9 - 10.3 mg/dL 8.6(L)  Total Protein 6.5 - 8.1 g/dL 6.1(L)  Total Bilirubin 0.3 - 1.2 mg/dL 0.6  Alkaline Phos 38 - 126 U/L 197(H)  AST 15 - 41 U/L 26  ALT 0 - 44 U/L 28   CBC Latest Ref Rng & Units 11/30/2019  WBC 4.0 - 10.5 K/uL 9.4  Hemoglobin 13.0 - 17.0 g/dL 10.7(L)  Hematocrit 39.0 - 52.0 % 33.2(L)  Platelets 150 - 400 K/uL 429(H)    No images are attached to the encounter.  CT Angio Chest PE W and/or Wo Contrast  Result Date: 11/29/2019 CLINICAL DATA:  Shortness of breath and dyspnea, known lung cancer, recent spinal survey EXAM: CT ANGIOGRAPHY CHEST WITH CONTRAST TECHNIQUE: Multidetector CT imaging of the chest was performed using the standard protocol during bolus administration of intravenous contrast. Multiplanar CT image reconstructions and MIPs were obtained to evaluate the vascular anatomy. CONTRAST:  38m OMNIPAQUE IOHEXOL 350 MG/ML SOLN COMPARISON:  CT 10/31/2019, radiograph 11/29/2019 FINDINGS: Cardiovascular: Satisfactory opacification the pulmonary arteries to the segmental level. No pulmonary artery filling defects are identified. Central pulmonary arteries are normal caliber. Normal cardiac size. No pericardial effusion. Few coronary artery calcifications are present. Calcific plaque is present within the normal caliber thoracic aorta. Normal 3 vessel branching of the aortic arch with minimal calcification of the proximal great vessels. Mediastinum/Nodes:  No mediastinal fluid or gas. Normal thyroid gland and thoracic inlet. No acute abnormality of the trachea or esophagus. No worrisome mediastinal, hilar or axillary adenopathy. Lungs/Pleura: Severe centrilobular and paraseptal emphysematous changes. Multiple right upper lobe pulmonary masses are redemonstrated is  include a 2.2 x 1.8 cm spiculated region of low attenuation in the anterior apex (6/22). A smaller 1.3 by 0.9 cm focus is seen in the posterior apex (6/15). There is a more ill-defined heterogeneous region of soft tissue density extending across the posteromedial aspect of the right upper lobe and apex with extension into the chest chest wall with associated bony destruction the T3 and T4 vertebral bodies and third rib posteriorly. This is grossly similar in size and appearance to the comparison exam though difficult to fully assess given the phase of contrast timing and extensive streak artifact from patient's newly placed thoracic spine hardware which limits of soft tissue and bone resolution. A more smoothly marginated extension possibly a separate mass further extends laterally with destruction fourth rib in involvement of the chest wall similar to prior exam as well. Upper Abdomen: Redemonstration of the numerous peripherally enhancing lesions seen throughout the right and left lobe liver, largest in the right lobe measuring to 2.7 cm in size. No other acute upper abdominal abnormality. Musculoskeletal: Interval placement a T1-T6 posterior thoracic spinal fusion bridging the destructive pathologic fractures of the T3 and T4 vertebral bodies with transpedicular screws at the T1, T2, T5 and T6 levels. Paired posterior struts are noted on chest radiography. No acute hardware complication is seen. Hardware does result in significant streak artifact which limits evaluation of osseous structures and adjacent soft tissues. Destructive changes of the right chest wall and third and fourth ribs, as detailed  above. Extension into the canal is difficult to assess on this examination. Review of the MIP images confirms the above findings. IMPRESSION: 1. No evidence of acute pulmonary embolism. 2. Redemonstration of multiple right upper lobe pulmonary masses with associated bony destruction of the T3 and T4 vertebral bodies and third and fourth ribs posteriorly as well as gross invasion of the posterior chest wall. The degree of extension into the spinal canal is less well visualized on this study due to streak from interval placement of a T1-T6 spinal fusion. 3. Multiple peripherally enhancing lesions seen throughout the right and left lobe liver, largest in the right lobe measuring to 2.7 cm in size, compatible with metastatic disease. 4. No new pulmonary nodules or masses. No acute superimposed intrathoracic process. 5. Aortic Atherosclerosis (ICD10-I70.0) 6. Emphysema (ICD10-J43.9). Electronically Signed   By: Lovena Le M.D.   On: 11/29/2019 03:56   NM PET Image Initial (PI) Skull Base To Thigh  Result Date: 12/06/2019 CLINICAL DATA:  Initial treatment strategy for lung cancer post spinal fusion for thoracic spine involvement. EXAM: NUCLEAR MEDICINE PET SKULL BASE TO THIGH TECHNIQUE: 6.51 mCi F-18 FDG was injected intravenously. Full-ring PET imaging was performed from the skull base to thigh after the radiotracer. CT data was obtained and used for attenuation correction and anatomic localization. Fasting blood glucose: 96 mg/dl COMPARISON:  CT chest, abdomen and pelvis of 10/31/2019, CT angiography of the chest of 11/29/2019 FINDINGS: Mediastinal blood pool activity: SUV max 1.39 Liver activity: SUV max NA NECK: No hypermetabolic lymph nodes in the neck. Incidental CT findings: none CHEST: Soft tissue mass along the RIGHT lung apex destroying portions of the RIGHT second and third ribs and associated with multiple levels of the thoracic spine now post spinal fusion about the T3 level extending from T1, spanning  T3 and T4 with distal pedicle screw placement at T5-T6. Soft tissue mass along the RIGHT paraspinal border measuring 5.9 x 2.4 cm (image 77, series 3) (SUVmax = 12.8, contiguous with soft  tissue that extends posteriorly destroying posterior ribs 3 and 4. Pleural thickening along the anterior RIGHT upper lobe contiguous with pulmonary lesion. Lesion extending into the parenchyma measuring 2.2 x 2.2 cm (image 86, series 3) (SUVmax = 5.4. Pleural thickening extends away from the parenchymal portion at least 2 cm. Increased FDG uptake is also noted along intercostal musculature and associated with rib destruction in the posterior RIGHT upper chest. Soft tissue and increased FDG uptake crosses the midline at the T3 and T4 level with slightly less FDG uptake than the greatest area of uptake along the RIGHT of the spine. No mediastinal adenopathy. No hilar adenopathy. No subcarinal adenopathy. No axillary lymphadenopathy.) Incidental CT findings: Severe pulmonary emphysema. Small RIGHT-sided pleural effusion without nodular areas of FDG avidity. Airways are patent. Calcified atherosclerotic changes of the thoracic aorta. Calcified coronary artery disease. ABDOMEN/PELVIS: Multifocal areas of FDG uptake in the liver 6 in total compatible with metastatic disease also seen on the recent chest abdomen and pelvis CT dimensions appears similar, better seen on previous exam. The largest area in the lateral section of the LEFT hepatic lobe (image 156 of series 3 (SUVmax = 13.2) greatest axial dimension of 4.1 cm as measured based on FDG uptake, this area measured approximately 4.2 cm on the previous CT. Largest area in the RIGHT hepatic lobe with area of uptake approximately 5.2 cm (image 179, series 3) (SUVmax = approximately 11.5, contiguous with an adjacent smaller area that was seen on the previous CT. Other areas display similar FDG uptake. No abnormal uptake in the adrenal glands. No visible adenopathy.) Incidental CT  findings: Liver as described. Spleen without suspicious finding. Limited assessment of pancreas is grossly normal. No hydronephrosis. No signs of acute bowel process limited assessment due to respiratory motion and low-dose on the CT evaluation. Calcified atherosclerotic changes of the abdominal aorta tracking in the iliac vasculature. SKELETON: Destructive changes in the spine associated with the RIGHT upper lobe and perispinal mass as described. Also associated with rib destruction also described above. Incidental CT findings: Multilevel laminectomy associated with the spinal fusion as seen on previous studies. Spinal degenerative changes. IMPRESSION: 1. Signs of bony destruction and large pleural and paraspinal mass in the RIGHT upper lobe with a second smaller area of disease in the peripheral anterior RIGHT upper lobe as described. 2. Signs of spinal fusion and laminectomy associated with T3-T4 destruction also with adjacent destruction of ribs as discussed. 3. Multifocal hepatic metastatic disease. 4. Aortic Atherosclerosis (ICD10-I70.0) and Emphysema (ICD10-J43.9). Electronically Signed   By: Zetta Bills M.D.   On: 12/06/2019 16:51   DG Chest Portable 1 View  Result Date: 11/29/2019 CLINICAL DATA:  Shortness of breath for 2-3 days, history of lung cancer EXAM: PORTABLE CHEST 1 VIEW COMPARISON:  CT 10/31/2019, radiograph 05/08/2017 FINDINGS: Redemonstration of the spiculated mass in the right lung apex with associated destructive changes of the posterior right third rib and upper thoracic vertebrae bridged by the vertebral fusion rods. Findings on a background of severe centrilobular and paraseptal emphysematous changes better demonstrated on comparison cross-sectional imaging. Chronic bronchitic features are present as well. No new consolidative opacity. No convincing features of pulmonary edema. Cardiomediastinal contours are stable. IMPRESSION: 1. Redemonstrated masses towards the right lung apex with  associated destructive changes of the posterior right third rib and upper thoracic vertebrae bridged by hardware. 2. No acute cardiopulmonary abnormality. 3. Chronic bronchitic and emphysematous changes. Electronically Signed   By: Lovena Le M.D.   On: 11/29/2019 03:16  Assessment and plan- Patient is a 64 y.o. male who presents to [facility] for initial meeting in preparation for starting chemotherapy for the treatment of lung cancer.   1. 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  He was hospitalized from 11/29/2019 to 11/30/2019 with hypoxic respiratory failure.  He was discharged home on O2.  He is pending initiation of RT as well as single agent Keytruda.  2. Chemo Care Clinic/High Risk for ER/Hospitalization during chemotherapy- We discussed the role of the chemo care clinic and identified patient specific risk factors. I discussed that patient was identified as high risk primarily based on: Stage of disease and comorbidities.  Patient has past medical history positive for: Past Medical History:  Diagnosis Date  . Allergy   . COPD (chronic obstructive pulmonary disease) ([place])   . Prostate enlargement     Patient has past surgical history positive for: Past Surgical History:  Procedure Laterality Date  . HERNIA REPAIR Bilateral 1982  . HERNIA REPAIR Right 1994  . LAMINECTOMY FOR EXCISION / EVACUATION INTRASPINAL LESION  11/07/2019   T1-T 6 at [facility]     Based on our high risk symptom management report; this patient has a high risk of ED utilization.  The percentage below indicates how "at risk "  this patient based on the factors in this table within one year.   General Risk Score: 9  Values used to calculate this  score:   Points  Metrics      1        Age: 24      2        Hospital Admissions: 2      3        ED Visits: 3      1        Has Chronic Obstructive Pulmonary Disease: Yes      0        Has Diabetes: No      0        Has Congestive Heart Failure: No      1        Has liver disease: Yes      1        Has Depression: Yes      0        Current PCP: Steele Sizer, MD      0        Has Medicaid: No  3. We discussed that social determinants of health may have significant impacts on health and outcomes for cancer patients.  Today we discussed specific social determinants of performance status, alcohol use, depression, financial needs, food insecurity, housing, interpersonal violence, social connections, stress, tobacco use, and transportation.    After lengthy discussion the following were identified as areas of need: None at this time  Patient's wife will contact clinic with any concerns or questions.   [facility] information given for follow-up.  Outpatient services: We discussed options including home based and outpatient services, DME and care program. We discusssed that patients who participate in regular physical activity report fewer negative impacts of cancer and treatments and report less fatigue.   Financial Concerns: We discussed that living with cancer can create tremendous financial burden.  We discussed options  for assistance. I asked that if assistance is needed in affording medications or paying bills to please let us know so that we can provide assistance. We discussed options for food including social services, [employer] ($50 every 2 weeks) and onsite food pantry.  We will also notify [facility] to see if cancer center can provide additional support.  Referral to Social work: Introduced Education officer, museum Elease Etienne and the services he can provide such as support with [employer], cell phone and gas vouchers.   Support groups: We discussed options for support groups  at the cancer center. If interested, please notify nurse navigator to enroll. We discussed options for managing stress including healthy eating, exercise as well as participating in no charge counseling services at the cancer center and support groups.  If these are of interest, patient can notify either myself or primary nursing team.We discussed options for management including medications and referral to quit Smart program  Transportation: We discussed options for transportation including acta, paratransit, bus routes, link transit, taxi/uber/lyft, and cancer center [place].  I have notified primary oncology team who will help assist with arranging Lucianne Lei transportation for appointments when/if needed. We also discussed options for transportation on short notice/acute visits.  Palliative care services: We have palliative care services available in the cancer center to discuss goals of care and advanced care planning.  Please let us know if you have any questions or would like to speak to our palliative nurse practitioner.  Symptom Management Clinic: We discussed our symptom management clinic which is available for acute concerns while receiving treatment such as nausea, vomiting or diarrhea.  We can be reached via telephone at [phone] or through my chart.  We are available for virtual or in person visits on the same day from 830 to 4 PM Monday through Friday. He denies needing specific assistance at this time and He will be followed by Dr. Rogue Bussing clinical team.  Plan: Discussed symptom management clinic. Discussed palliative care services. Discussed resources that are available here at the cancer center. Discussed medications and new prescriptions to begin treatment such as anti-nausea or steroids.   Disposition: RTC daily for radiation.  RTC on 12/21/19 for labs, MD assessment and initiation of single agent Keytruda.  Visit Diagnosis 1. Cancer of upper lobe of right lung [facility])     Patient  expressed understanding and was in agreement with this plan. He also understands that He can call clinic at any time with any questions, concerns, or complaints.   Greater than 50% was spent in counseling and coordination of care with this patient including but not limited to discussion of the relevant topics above (See A&P) including, but not limited to diagnosis and management of acute and chronic medical conditions.   [place] [facility] [place]  CC:

## 2019-12-20 NOTE — Progress Notes (Signed)
  Oncology Nurse Navigator Documentation  Navigator Location: [facility] (12/19/19 1400)   )Navigator Encounter Type: Lobby (12/19/19 1400)                     Patient Visit Type: FJKNIO (12/19/19 1400)   Barriers/Navigation Needs: Coordination of Care (12/19/19 1400)   Interventions: Coordination of Care (12/19/19 1400)   Coordination of Care: Appts;Chemo (12/19/19 1400)       met with patient in the lobby after finishing daily radiation treatment. Pt informed of MD recommendations to start treatment with Keytruda on Wed 12/21/19 and will add in chemotherapy once finished with radiation. Pt given print out of appts. Updated on the status of Foundation One testing. Pt verbalized understanding. Nothing further needed at this time.            Time Spent with Patient: 30 (12/19/19 1400)

## 2019-12-21 ENCOUNTER — Inpatient Hospital Stay (HOSPITAL_BASED_OUTPATIENT_CLINIC_OR_DEPARTMENT_OTHER): Payer: Medicare HMO | Admitting: Hospice and Palliative Medicine

## 2019-12-21 ENCOUNTER — Inpatient Hospital Stay (HOSPITAL_BASED_OUTPATIENT_CLINIC_OR_DEPARTMENT_OTHER): Payer: Medicare HMO | Admitting: Internal Medicine

## 2019-12-21 ENCOUNTER — Encounter: Payer: Self-pay | Admitting: Internal Medicine

## 2019-12-21 ENCOUNTER — Encounter: Payer: Self-pay | Admitting: *Deleted

## 2019-12-21 ENCOUNTER — Ambulatory Visit
Admission: RE | Admit: 2019-12-21 | Discharge: 2019-12-21 | Disposition: A | Discharge status: Home / Self Care | Payer: Medicare HMO | Source: Ambulatory Visit | Attending: Radiation Oncology | Admitting: Radiation Oncology

## 2019-12-21 ENCOUNTER — Inpatient Hospital Stay: Payer: Medicare HMO

## 2019-12-21 DIAGNOSIS — C3490 Malignant neoplasm of unspecified part of unspecified bronchus or lung: Secondary | ICD-10-CM

## 2019-12-21 DIAGNOSIS — Z515 Encounter for palliative care: Secondary | ICD-10-CM

## 2019-12-21 DIAGNOSIS — C3411 Malignant neoplasm of upper lobe, right bronchus or lung: Secondary | ICD-10-CM | POA: Diagnosis not present

## 2019-12-21 DIAGNOSIS — Z510 Encounter for antineoplastic radiation therapy: Secondary | ICD-10-CM | POA: Diagnosis not present

## 2019-12-21 DIAGNOSIS — Z5112 Encounter for antineoplastic immunotherapy: Secondary | ICD-10-CM | POA: Diagnosis not present

## 2019-12-21 DIAGNOSIS — Z7189 Other specified counseling: Secondary | ICD-10-CM

## 2019-12-21 LAB — CBC WITH DIFFERENTIAL/PLATELET
Abs Immature Granulocytes: 0.04 10*3/uL (ref 0.00–0.07)
Basophils Absolute: 0.1 10*3/uL (ref 0.0–0.1)
Basophils Relative: 1 %
Eosinophils Absolute: 0.1 10*3/uL (ref 0.0–0.5)
Eosinophils Relative: 1 %
HCT: 38.1 % — ABNORMAL LOW (ref 39.0–52.0)
Hemoglobin: 12.3 g/dL — ABNORMAL LOW (ref 13.0–17.0)
Immature Granulocytes: 1 %
Lymphocytes Relative: 7 %
Lymphs Abs: 0.6 10*3/uL — ABNORMAL LOW (ref 0.7–4.0)
MCH: 28.9 pg (ref 26.0–34.0)
MCHC: 32.3 g/dL (ref 30.0–36.0)
MCV: 89.6 fL (ref 80.0–100.0)
Monocytes Absolute: 0.7 10*3/uL (ref 0.1–1.0)
Monocytes Relative: 8 %
Neutro Abs: 6.7 10*3/uL (ref 1.7–7.7)
Neutrophils Relative %: 82 %
Platelets: 358 10*3/uL (ref 150–400)
RBC: 4.25 MIL/uL (ref 4.22–5.81)
RDW: 13.9 % (ref 11.5–15.5)
WBC: 8.1 10*3/uL (ref 4.0–10.5)
nRBC: 0 % (ref 0.0–0.2)

## 2019-12-21 LAB — COMPREHENSIVE METABOLIC PANEL
ALT: 26 U/L (ref 0–44)
AST: 29 U/L (ref 15–41)
Albumin: 3 g/dL — ABNORMAL LOW (ref 3.5–5.0)
Alkaline Phosphatase: 250 U/L — ABNORMAL HIGH (ref 38–126)
Anion gap: 8 (ref 5–15)
BUN: 14 mg/dL (ref 8–23)
CO2: 30 mmol/L (ref 22–32)
Calcium: 8.6 mg/dL — ABNORMAL LOW (ref 8.9–10.3)
Chloride: 97 mmol/L — ABNORMAL LOW (ref 98–111)
Creatinine, Ser: 0.48 mg/dL — ABNORMAL LOW (ref 0.61–1.24)
GFR calc Af Amer: >60 mL/min (ref >60)
GFR calc non Af Amer: >60 mL/min (ref >60)
Glucose, Bld: 181 mg/dL — ABNORMAL HIGH (ref 70–99)
Potassium: 3.9 mmol/L (ref 3.5–5.1)
Sodium: 135 mmol/L (ref 135–145)
Total Bilirubin: 0.4 mg/dL (ref 0.3–1.2)
Total Protein: 7.3 g/dL (ref 6.5–8.1)

## 2019-12-21 LAB — TSH: TSH: 1.134 u[IU]/mL (ref 0.350–4.500)

## 2019-12-21 MED ORDER — SODIUM CHLORIDE 0.9 % IV SOLN
Freq: Once | INTRAVENOUS | Status: AC
  Filled 2019-12-21: qty 250

## 2019-12-21 MED ORDER — PROCHLORPERAZINE MALEATE 10 MG PO TABS
10.0000 mg | ORAL_TABLET | Freq: Four times a day (QID) | ORAL | 1 refills | Status: DC | PRN
Start: 2019-12-21 — End: 2022-01-15

## 2019-12-21 MED ORDER — SODIUM CHLORIDE 0.9 % IV SOLN
200.0000 mg | Freq: Once | INTRAVENOUS | Status: AC
  Administered 2019-12-21: 200 mg via INTRAVENOUS
  Filled 2019-12-21: qty 8

## 2019-12-21 MED ORDER — CYANOCOBALAMIN 1000 MCG/ML IJ SOLN
1000.0000 ug | Freq: Once | INTRAMUSCULAR | Status: AC
  Administered 2019-12-21: 1000 ug via INTRAMUSCULAR
  Filled 2019-12-21: qty 1

## 2019-12-21 NOTE — Progress Notes (Signed)
[place]  [phone] [phone]   Name: Cody [name] Date: 12/21/2019 MRN: [id number]  DOB: [birth year]  Patient Care Team: Steele Sizer, MD as PCP - General (Family Medicine) Christene Lye, MD (General Surgery) Telford Nab, RN as Oncology Nurse Navigator    REASON FOR CONSULTATION: Cody [name] is a 64 y.o. 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  Patient was hospitalized from 11/29/2019 to 11/30/2019 with hypoxic respiratory failure.  He was discharged home on O2.  Patient is pending initiation of RT.  He was referred to palliative care to help address goals and manage ongoing symptoms.  SOCIAL HISTORY:     reports that he quit smoking about 8 years ago. He has a 60.00 pack-year smoking history. He has never used smokeless tobacco. He reports current alcohol use of about 5.0 standard drinks of alcohol per week. He reports that he does not use drugs.   Patient is married and lives at home with his wife.  He has 3 daughters who live in [place] and a son in [place].  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  ADVANCE DIRECTIVES:  Does not have  CODE STATUS:   PAST MEDICAL HISTORY: Past Medical History:  Diagnosis Date  . Allergy   . COPD (chronic obstructive pulmonary disease) ([place])   . Prostate enlargement     PAST SURGICAL HISTORY:  Past Surgical History:  Procedure Laterality Date  . HERNIA REPAIR Bilateral 1982  . HERNIA REPAIR Right 1994  . LAMINECTOMY FOR EXCISION / EVACUATION INTRASPINAL LESION  11/07/2019   T1-T 6 at [facility]     HEMATOLOGY/ONCOLOGY HISTORY:  Oncology History Overview Note  #  April 2021- RUL lung cancer; liver met; spinal cord compression/ vertebral mets [[facility]]; [[place]]; LIVER Bx- Metastatic adenocarcinoma, consistent with lung primary.- TPS % Interpretation -PD-L1 IHC 10 LOW EXPRESSION POSITIVE   # 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   # MAY 26th-Keytrda.   # NGS/MOLECULAR TESTS:   # PALLIATIVE CARE EVALUATION:  # PAIN MANAGEMENT:    DIAGNOSIS:   STAGE:         ;  GOALS:  CURRENT/MOST RECENT THERAPY :     Cancer of upper lobe of right lung ([place])  11/23/2019 Initial Diagnosis   Cancer of upper lobe of right lung ([place])   12/21/2019 -  Chemotherapy   The patient had palonosetron (ALOXI) injection 0.25 mg, 0.25 mg, Intravenous,  Once, 0 of 3 cycles PEMEtrexed (ALIMTA) 800 mg in sodium chloride 0.9 % 100 mL chemo infusion, 500 mg/m2 = 800 mg, Intravenous,  Once, 0 of 5 cycles CARBOplatin (PARAPLATIN) in sodium chloride 0.9 % 100 mL chemo infusion, , Intravenous,  Once, 0 of 3 cycles fosaprepitant (EMEND) 150 mg in sodium chloride 0.9 % 145 mL IVPB, 150 mg, Intravenous,  Once, 0 of 3 cycles pembrolizumab (KEYTRUDA) 200 mg in sodium chloride 0.9 % 50 mL chemo infusion, 200 mg, Intravenous, Once, 1 of 6 cycles Administration: 200 mg (12/21/2019)  for chemotherapy treatment.      ALLERGIES:  has No Known Allergies.  MEDICATIONS:  Current Outpatient Medications  Medication Sig Dispense Refill  . acetaminophen (TYLENOL) 500 MG tablet Take 500-1,000 mg by mouth every 6 (six) hours as needed for mild pain or fever.     . escitalopram (LEXAPRO) 5 MG tablet Take 1 tablet (5 mg total) by mouth daily. am's 30 tablet 0  . Fluticasone-Umeclidin-Vilant 100-62.5-25 MCG/INH AEPB Inhale 1 puff into the lungs daily.     . Multiple Vitamins-Minerals (MULTIVITAMIN GUMMIES MENS PO) Take 1 Dose by mouth daily.     . ondansetron (ZOFRAN) 4 MG tablet Take 1  tablet (4 mg total) by mouth every 8 (eight) hours as needed for nausea or vomiting. (Patient not taking: Reported on 12/21/2019) 20 tablet 2  . prochlorperazine (COMPAZINE) 10 MG tablet Take 1 tablet (10 mg total) by mouth every 6 (six) hours as needed for nausea or vomiting. 40 tablet 1  . QUEtiapine (SEROQUEL) 25 MG tablet Take 1 tablet (25 mg total) by mouth at bedtime. 30 tablet 0   No current facility-administered medications for this visit.    VITAL SIGNS: There were no vitals taken for this visit. There were no vitals filed for this visit.  Estimated body mass index is 17.07 kg/m as calculated from the following:   Height as of 12/08/19: _0  (1.753 m).   Weight as of an earlier encounter on 12/21/19: 115 lb 9.6 oz (52.4 kg).  LABS: CBC:    Component Value Date/Time   WBC 8.1 12/21/2019 1010   HGB 12.3 (L) 12/21/2019 1010   HGB 15.2 10/21/2019 1517   HCT 38.1 (L) 12/21/2019 1010   HCT 43.0 10/21/2019 1517   PLT 358 12/21/2019 1010   PLT 325 10/21/2019 1517   MCV 89.6 12/21/2019 1010   MCV 86 10/21/2019 1517   NEUTROABS 6.7 12/21/2019 1010   NEUTROABS 5.9 10/21/2019 1517   LYMPHSABS 0.6 (L) 12/21/2019 1010   LYMPHSABS 1.3 10/21/2019 1517   MONOABS 0.7 12/21/2019 1010   EOSABS 0.1 12/21/2019 1010   EOSABS 0.0 10/21/2019 1517   BASOSABS 0.1 12/21/2019 1010   BASOSABS 0.1 10/21/2019 1517   Comprehensive Metabolic Panel:    Component Value Date/Time   NA 135 12/21/2019 1010   NA 135 10/21/2019 1517   K 3.9 12/21/2019 1010   CL 97 (L) 12/21/2019 1010   CO2 30 12/21/2019 1010   BUN 14 12/21/2019 1010   BUN 14 10/21/2019 1517   CREATININE 0.48 (L) 12/21/2019 1010   CREATININE 0.87 03/16/2017 1211   GLUCOSE 181 (H) 12/21/2019 1010   CALCIUM 8.6 (L) 12/21/2019 1010   AST 29 12/21/2019 1010   ALT 26 12/21/2019 1010   ALKPHOS 250 (H) 12/21/2019 1010   BILITOT 0.4 12/21/2019 1010   BILITOT 0.5 10/21/2019 1517   PROT 7.3 12/21/2019 1010   PROT 7.0 10/21/2019 1517    ALBUMIN 3.0 (L) 12/21/2019 1010   ALBUMIN 3.6 (L) 10/21/2019 1517    RADIOGRAPHIC STUDIES: CT Angio Chest PE W and/or Wo Contrast  Result Date: 11/29/2019 CLINICAL DATA:  Shortness of breath and dyspnea, known lung cancer, recent spinal survey EXAM: CT ANGIOGRAPHY CHEST WITH CONTRAST TECHNIQUE: Multidetector CT imaging of the chest was performed using the standard protocol during bolus administration of intravenous contrast. Multiplanar CT image reconstructions and MIPs were obtained to evaluate the vascular anatomy. CONTRAST:  33m OMNIPAQUE IOHEXOL 350 MG/ML SOLN COMPARISON:  CT 10/31/2019, radiograph 11/29/2019 FINDINGS: Cardiovascular: Satisfactory opacification the pulmonary arteries to the segmental level. No pulmonary artery filling defects are identified. Central pulmonary arteries are normal  caliber. Normal cardiac size. No pericardial effusion. Few coronary artery calcifications are present. Calcific plaque is present within the normal caliber thoracic aorta. Normal 3 vessel branching of the aortic arch with minimal calcification of the proximal great vessels. Mediastinum/Nodes: No mediastinal fluid or gas. Normal thyroid gland and thoracic inlet. No acute abnormality of the trachea or esophagus. No worrisome mediastinal, hilar or axillary adenopathy. Lungs/Pleura: Severe centrilobular and paraseptal emphysematous changes. Multiple right upper lobe pulmonary masses are redemonstrated is include a 2.2 x 1.8 cm spiculated region of low attenuation in the anterior apex (6/22). A smaller 1.3 by 0.9 cm focus is seen in the posterior apex (6/15). There is a more ill-defined heterogeneous region of soft tissue density extending across the posteromedial aspect of the right upper lobe and apex with extension into the chest chest wall with associated bony destruction the T3 and T4 vertebral bodies and third rib posteriorly. This is grossly similar in size and appearance to the comparison exam though difficult  to fully assess given the phase of contrast timing and extensive streak artifact from patient's newly placed thoracic spine hardware which limits of soft tissue and bone resolution. A more smoothly marginated extension possibly a separate mass further extends laterally with destruction fourth rib in involvement of the chest wall similar to prior exam as well. Upper Abdomen: Redemonstration of the numerous peripherally enhancing lesions seen throughout the right and left lobe liver, largest in the right lobe measuring to 2.7 cm in size. No other acute upper abdominal abnormality. Musculoskeletal: Interval placement a T1-T6 posterior thoracic spinal fusion bridging the destructive pathologic fractures of the T3 and T4 vertebral bodies with transpedicular screws at the T1, T2, T5 and T6 levels. Paired posterior struts are noted on chest radiography. No acute hardware complication is seen. Hardware does result in significant streak artifact which limits evaluation of osseous structures and adjacent soft tissues. Destructive changes of the right chest wall and third and fourth ribs, as detailed above. Extension into the canal is difficult to assess on this examination. Review of the MIP images confirms the above findings. IMPRESSION: 1. No evidence of acute pulmonary embolism. 2. Redemonstration of multiple right upper lobe pulmonary masses with associated bony destruction of the T3 and T4 vertebral bodies and third and fourth ribs posteriorly as well as gross invasion of the posterior chest wall. The degree of extension into the spinal canal is less well visualized on this study due to streak from interval placement of a T1-T6 spinal fusion. 3. Multiple peripherally enhancing lesions seen throughout the right and left lobe liver, largest in the right lobe measuring to 2.7 cm in size, compatible with metastatic disease. 4. No new pulmonary nodules or masses. No acute superimposed intrathoracic process. 5. Aortic  Atherosclerosis (ICD10-I70.0) 6. Emphysema (ICD10-J43.9). Electronically Signed   By: Lovena Le M.D.   On: 11/29/2019 03:56   NM PET Image Initial (PI) Skull Base To Thigh  Result Date: 12/06/2019 CLINICAL DATA:  Initial treatment strategy for lung cancer post spinal fusion for thoracic spine involvement. EXAM: NUCLEAR MEDICINE PET SKULL BASE TO THIGH TECHNIQUE: 6.51 mCi F-18 FDG was injected intravenously. Full-ring PET imaging was performed from the skull base to thigh after the radiotracer. CT data was obtained and used for attenuation correction and anatomic localization. Fasting blood glucose: 96 mg/dl COMPARISON:  CT chest, abdomen and pelvis of 10/31/2019, CT angiography of the chest of 11/29/2019 FINDINGS: Mediastinal blood pool activity: SUV max 1.39 Liver activity: SUV max NA NECK: No  hypermetabolic lymph nodes in the neck. Incidental CT findings: none CHEST: Soft tissue mass along the RIGHT lung apex destroying portions of the RIGHT second and third ribs and associated with multiple levels of the thoracic spine now post spinal fusion about the T3 level extending from T1, spanning T3 and T4 with distal pedicle screw placement at T5-T6. Soft tissue mass along the RIGHT paraspinal border measuring 5.9 x 2.4 cm (image 77, series 3) (SUVmax = 12.8, contiguous with soft tissue that extends posteriorly destroying posterior ribs 3 and 4. Pleural thickening along the anterior RIGHT upper lobe contiguous with pulmonary lesion. Lesion extending into the parenchyma measuring 2.2 x 2.2 cm (image 86, series 3) (SUVmax = 5.4. Pleural thickening extends away from the parenchymal portion at least 2 cm. Increased FDG uptake is also noted along intercostal musculature and associated with rib destruction in the posterior RIGHT upper chest. Soft tissue and increased FDG uptake crosses the midline at the T3 and T4 level with slightly less FDG uptake than the greatest area of uptake along the RIGHT of the spine. No  mediastinal adenopathy. No hilar adenopathy. No subcarinal adenopathy. No axillary lymphadenopathy.) Incidental CT findings: Severe pulmonary emphysema. Small RIGHT-sided pleural effusion without nodular areas of FDG avidity. Airways are patent. Calcified atherosclerotic changes of the thoracic aorta. Calcified coronary artery disease. ABDOMEN/PELVIS: Multifocal areas of FDG uptake in the liver 6 in total compatible with metastatic disease also seen on the recent chest abdomen and pelvis CT dimensions appears similar, better seen on previous exam. The largest area in the lateral section of the LEFT hepatic lobe (image 156 of series 3 (SUVmax = 13.2) greatest axial dimension of 4.1 cm as measured based on FDG uptake, this area measured approximately 4.2 cm on the previous CT. Largest area in the RIGHT hepatic lobe with area of uptake approximately 5.2 cm (image 179, series 3) (SUVmax = approximately 11.5, contiguous with an adjacent smaller area that was seen on the previous CT. Other areas display similar FDG uptake. No abnormal uptake in the adrenal glands. No visible adenopathy.) Incidental CT findings: Liver as described. Spleen without suspicious finding. Limited assessment of pancreas is grossly normal. No hydronephrosis. No signs of acute bowel process limited assessment due to respiratory motion and low-dose on the CT evaluation. Calcified atherosclerotic changes of the abdominal aorta tracking in the iliac vasculature. SKELETON: Destructive changes in the spine associated with the RIGHT upper lobe and perispinal mass as described. Also associated with rib destruction also described above. Incidental CT findings: Multilevel laminectomy associated with the spinal fusion as seen on previous studies. Spinal degenerative changes. IMPRESSION: 1. Signs of bony destruction and large pleural and paraspinal mass in the RIGHT upper lobe with a second smaller area of disease in the peripheral anterior RIGHT upper lobe  as described. 2. Signs of spinal fusion and laminectomy associated with T3-T4 destruction also with adjacent destruction of ribs as discussed. 3. Multifocal hepatic metastatic disease. 4. Aortic Atherosclerosis (ICD10-I70.0) and Emphysema (ICD10-J43.9). Electronically Signed   By: Zetta Bills M.D.   On: 12/06/2019 16:51   DG Chest Portable 1 View  Result Date: 11/29/2019 CLINICAL DATA:  Shortness of breath for 2-3 days, history of lung cancer EXAM: PORTABLE CHEST 1 VIEW COMPARISON:  CT 10/31/2019, radiograph 05/08/2017 FINDINGS: Redemonstration of the spiculated mass in the right lung apex with associated destructive changes of the posterior right third rib and upper thoracic vertebrae bridged by the vertebral fusion rods. Findings on a background of severe centrilobular and  paraseptal emphysematous changes better demonstrated on comparison cross-sectional imaging. Chronic bronchitic features are present as well. No new consolidative opacity. No convincing features of pulmonary edema. Cardiomediastinal contours are stable. IMPRESSION: 1. Redemonstrated masses towards the right lung apex with associated destructive changes of the posterior right third rib and upper thoracic vertebrae bridged by hardware. 2. No acute cardiopulmonary abnormality. 3. Chronic bronchitic and emphysematous changes. Electronically Signed   By: Lovena Le M.D.   On: 11/29/2019 03:16    PERFORMANCE STATUS (ECOG) : 2 - Symptomatic, <50% confined to bed  Review of Systems Unless otherwise noted, a complete review of systems is negative.  Physical Exam General: NAD, frail appearing, in wheelchair Pulmonary: Unlabored, on O2 Extremities: no edema, no joint deformities Skin: no rashes Neurological: Weakness but otherwise nonfocal  IMPRESSION: Routine follow-up visit.  Patient was seen in the infusion area.  Patient says he is doing better.  He denies any significant changes or concerns today.  No distressing symptoms at  present.  Patient says that his sleep is better after his PCP started him on Seroquel.  Patient says that his appetite is not great.  He has lost about 6 pounds over the past several weeks. Will refer to nutrition.   Will plan to speak with patient again about ACP during future visit.   PLAN: -Continue current scope of treatment - Referral to nutrition -ACP/MOST form previously reviewed -RTC in 3 weeks   Patient expressed understanding and was in agreement with this plan. He also understands that He can call the clinic at any time with any questions, concerns, or complaints.     Time Total: 10 minutes  Visit consisted of counseling and education dealing with the complex and emotionally intense issues of symptom management and palliative care in the setting of serious and potentially life-threatening illness.Greater than 50%  of this time was spent counseling and coordinating care related to the above assessment and plan.  Signed by: Altha Harm, PhD, NP-C

## 2019-12-21 NOTE — Progress Notes (Signed)
  Oncology Nurse Navigator Documentation  Navigator Location: [facility] (12/21/19 1200)   )Navigator Encounter Type: Follow-up Appt;Treatment (12/21/19 1200)                   Treatment Initiated Date: 12/14/19 (12/21/19 1200) Patient Visit Type: MedOnc (12/21/19 1200) Treatment Phase: First Chemo Tx (12/21/19 1200) Barriers/Navigation Needs: No Barriers At This Time (12/21/19 1200)   Interventions: None Required (12/21/19 1200)             met with patient prior to first chemo treatment today. All questions answered during visit. Pt did not voice any needs during visit. No barriers noted at this time. Pt instructed to call with any further questions or needs. Pt verbalized understanding.         Time Spent with Patient: 30 (12/21/19 1200)

## 2019-12-21 NOTE — Progress Notes (Signed)
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   # MAY 26th-Keytrda.   # NGS/MOLECULAR TESTS:   # PALLIATIVE CARE EVALUATION:  # PAIN MANAGEMENT:    DIAGNOSIS:   STAGE:         ;  GOALS:  CURRENT/MOST RECENT THERAPY :     Cancer of upper lobe of right lung ([place])  11/23/2019 Initial Diagnosis   Cancer of upper lobe of right lung ([place])   12/21/2019 -  Chemotherapy   The patient had palonosetron (ALOXI) injection 0.25 mg, 0.25 mg, Intravenous,  Once, 0 of 3 cycles PEMEtrexed (ALIMTA) 800 mg in sodium chloride 0.9 % 100 mL chemo infusion, 500 mg/m2 = 800 mg, Intravenous,  Once, 0 of 5 cycles CARBOplatin (PARAPLATIN) in sodium chloride 0.9 % 100 mL chemo infusion, , Intravenous,  Once, 0 of 3 cycles fosaprepitant (EMEND) 150 mg in sodium chloride 0.9 % 145 mL IVPB, 150 mg, Intravenous,  Once, 0 of 3 cycles pembrolizumab (KEYTRUDA) 200 mg in sodium chloride 0.9 % 50 mL chemo infusion, 200 mg, Intravenous, Once, 1 of 6 cycles Administration: 200 mg (12/21/2019)  for chemotherapy treatment.       HISTORY OF PRESENTING ILLNESS:  Cody [name] 64 y.o.  male patient with metastatic non-small cell lung cancer; advanced COPD 2 L home O2 is here for follow-up.  Patient has not had any hospitalization interim.  Patient has been evaluated by radiation  oncology-continue getting radiation until next week.  His appetite is good.  However he has lost more weight.  He continues to go around with his walker given his/spinal cord compression weakness.  Denies any worsening pain or worsening shortness of breath.  Review of Systems  Constitutional: Positive for malaise/fatigue and weight loss. Negative for chills, diaphoresis and fever.  HENT: Negative for nosebleeds and sore throat.   Eyes: Negative for double vision.  Respiratory: Positive for shortness of breath. Negative for cough, hemoptysis, sputum production and wheezing.   Cardiovascular: Negative for chest pain, palpitations, orthopnea and leg swelling.  Gastrointestinal: Negative for abdominal pain, blood in stool, constipation, diarrhea, heartburn, melena, nausea and vomiting.  Genitourinary: Negative for dysuria, frequency and urgency.  Musculoskeletal: Positive for back pain and joint pain.  Skin: Negative.  Negative for itching and rash.  Neurological: Negative for dizziness, tingling, focal weakness, weakness and headaches.  Endo/Heme/Allergies: Does not bruise/bleed easily.  Psychiatric/Behavioral: Negative for depression. The patient is not nervous/anxious and does not have insomnia.      MEDICAL HISTORY:  Past Medical History:  Diagnosis Date  . Allergy   . COPD (chronic obstructive pulmonary disease) ([place])   . Prostate enlargement     SURGICAL HISTORY: Past Surgical History:  Procedure Laterality Date  . HERNIA REPAIR Bilateral 1982  . HERNIA REPAIR Right 1994  . LAMINECTOMY FOR EXCISION / EVACUATION INTRASPINAL LESION  11/07/2019   T1-T 6 at [place]: Social History  Socioeconomic History  . Marital status: Married    Spouse name: [name]   . Number of children: 4  . Years of education: Not on file  . Highest education level: Some college, no degree  Occupational History  . Occupation: dispatch and delivery   Tobacco Use  . Smoking status:  Former Smoker    Packs/day: 2.00    Years: 30.00    Pack years: 60.00    Quit date: 07/29/2011    Years since quitting: 8.4  . Smokeless tobacco: Never Used  . Tobacco comment: pt vapes  Substance and Sexual Activity  . Alcohol use: Yes    Alcohol/week: 5.0 standard drinks    Types: 5 Standard drinks or equivalent per week    Comment: 1-2/day  . Drug use: No  . Sexual activity: Not Currently    Partners: Female  Other Topics Concern  . Not on file  Social History Narrative   Worked in warehouse/ quit smoking in 2015; beer 1/day; in [place]; with wife.    Social Determinants of Health   Financial Resource Strain: Low Risk   . Difficulty of Paying Living Expenses: Not hard at all  Food Insecurity: No Food Insecurity  . Worried About Charity fundraiser in the Last Year: Never true  . Ran Out of Food in the Last Year: Never true  Transportation Needs: No Transportation Needs  . Lack of Transportation (Medical): No  . Lack of Transportation (Non-Medical): No  Physical Activity: Insufficiently Active  . Days of Exercise per Week: 7 days  . Minutes of Exercise per Session: 20 min  Stress: No Stress Concern Present  . Feeling of Stress : Not at all  Social Connections: Not Isolated  . Frequency of Communication with Friends and Family: More than three times a week  . Frequency of Social Gatherings with Friends and Family: More than three times a week  . Attends Religious Services: More than 4 times per year  . Active Member of Clubs or Organizations: Yes  . Attends Archivist Meetings: More than 4 times per year  . Marital Status: Married  Human resources officer Violence: Not At Risk  . Fear of Current or Ex-Partner: No  . Emotionally Abused: No  . Physically Abused: No  . Sexually Abused: No    FAMILY HISTORY: Family History  Problem Relation Age of Onset  . Heart disease Father   . CVA Mother   . Lung cancer Sister   . Lung cancer Brother     ALLERGIES:  has  No Known Allergies.  MEDICATIONS:  Current Outpatient Medications  Medication Sig Dispense Refill  . acetaminophen (TYLENOL) 500 MG tablet Take 500-1,000 mg by mouth every 6 (six) hours as needed for mild pain or fever.     . escitalopram (LEXAPRO) 5 MG tablet Take 1 tablet (5 mg total) by mouth daily. am's 30 tablet 0  . Fluticasone-Umeclidin-Vilant 100-62.5-25 MCG/INH AEPB Inhale 1 puff into the lungs daily.     . Multiple Vitamins-Minerals (MULTIVITAMIN GUMMIES MENS PO) Take 1 Dose by mouth daily.     . QUEtiapine (SEROQUEL) 25 MG tablet Take 1 tablet (25 mg total) by mouth at bedtime. 30 tablet 0  . ondansetron (ZOFRAN) 4 MG tablet Take 1 tablet (4 mg total) by mouth every 8 (eight) hours as needed for nausea or vomiting. (Patient not taking: Reported on 12/21/2019) 20 tablet 2  . prochlorperazine (COMPAZINE) 10 MG tablet Take 1 tablet (10 mg total) by mouth  every 6 (six) hours as needed for nausea or vomiting. 40 tablet 1   No current facility-administered medications for this visit.      Marland Kitchen  PHYSICAL EXAMINATION: ECOG PERFORMANCE STATUS: 3 - Symptomatic, >50% confined to bed  Vitals:   12/21/19 1116  BP: 122/74  Pulse: 78  Resp: 18  Temp: 98 F (36.7 C)  SpO2: 100%   Filed Weights   12/21/19 1116  Weight: 115 lb 9.6 oz (52.4 kg)    Physical Exam  Constitutional: He is oriented to person, place, and time.  Thin built frail appearing male patient.  No acute distress.  HEENT negative.  Accompanied by his daughter  HENT:  Head: Normocephalic and atraumatic.  Mouth/Throat: Oropharynx is clear and moist. No oropharyngeal exudate.  Eyes: Pupils are equal, round, and reactive to light.  Cardiovascular: Normal rate and regular rhythm.  Pulmonary/Chest: No respiratory distress. He has no wheezes.  Decreased air entry bilaterally no wheeze or crackles.  Abdominal: Soft. Bowel sounds are normal. He exhibits no distension and no mass. There is no abdominal tenderness. There is no  rebound and no guarding.  Musculoskeletal:        General: No tenderness or edema. Normal range of motion.     Cervical back: Normal range of motion and neck supple.  Neurological: He is alert and oriented to person, place, and time.  Skin: Skin is warm.  Psychiatric: Affect normal.     LABORATORY DATA:  I have reviewed the data as listed Lab Results  Component Value Date   WBC 8.1 12/21/2019   HGB 12.3 (L) 12/21/2019   HCT 38.1 (L) 12/21/2019   MCV 89.6 12/21/2019   PLT 358 12/21/2019   Recent Labs    11/29/19 0247 11/30/19 0600 12/21/19 1010  NA 134* 132* 135  K 4.1 4.8 3.9  CL 97* 98 97*  CO2 27 31 30   GLUCOSE 108* 162* 181*  BUN 18 15 14   CREATININE 0.62 0.39* 0.48*  CALCIUM 8.8* 8.6* 8.6*  GFRNONAA >60 >60 >60  GFRAA >60 >60 >60  PROT 7.4 6.1* 7.3  ALBUMIN 3.3* 2.6* 3.0*  AST 42* 26 29  ALT 35 28 26  ALKPHOS 266* 197* 250*  BILITOT 0.7 0.6 0.4    RADIOGRAPHIC STUDIES: I have personally reviewed the radiological images as listed and agreed with the findings in the report. CT Angio Chest PE W and/or Wo Contrast  Result Date: 11/29/2019 CLINICAL DATA:  Shortness of breath and dyspnea, known lung cancer, recent spinal survey EXAM: CT ANGIOGRAPHY CHEST WITH CONTRAST TECHNIQUE: Multidetector CT imaging of the chest was performed using the standard protocol during bolus administration of intravenous contrast. Multiplanar CT image reconstructions and MIPs were obtained to evaluate the vascular anatomy. CONTRAST:  64m OMNIPAQUE IOHEXOL 350 MG/ML SOLN COMPARISON:  CT 10/31/2019, radiograph 11/29/2019 FINDINGS: Cardiovascular: Satisfactory opacification the pulmonary arteries to the segmental level. No pulmonary artery filling defects are identified. Central pulmonary arteries are normal caliber. Normal cardiac size. No pericardial effusion. Few coronary artery calcifications are present. Calcific plaque is present within the normal caliber thoracic aorta. Normal 3 vessel  branching of the aortic arch with minimal calcification of the proximal great vessels. Mediastinum/Nodes: No mediastinal fluid or gas. Normal thyroid gland and thoracic inlet. No acute abnormality of the trachea or esophagus. No worrisome mediastinal, hilar or axillary adenopathy. Lungs/Pleura: Severe centrilobular and paraseptal emphysematous changes. Multiple right upper lobe pulmonary masses are redemonstrated is include a 2.2 x 1.8 cm spiculated region  of low attenuation in the anterior apex (6/22). A smaller 1.3 by 0.9 cm focus is seen in the posterior apex (6/15). There is a more ill-defined heterogeneous region of soft tissue density extending across the posteromedial aspect of the right upper lobe and apex with extension into the chest chest wall with associated bony destruction the T3 and T4 vertebral bodies and third rib posteriorly. This is grossly similar in size and appearance to the comparison exam though difficult to fully assess given the phase of contrast timing and extensive streak artifact from patient's newly placed thoracic spine hardware which limits of soft tissue and bone resolution. A more smoothly marginated extension possibly a separate mass further extends laterally with destruction fourth rib in involvement of the chest wall similar to prior exam as well. Upper Abdomen: Redemonstration of the numerous peripherally enhancing lesions seen throughout the right and left lobe liver, largest in the right lobe measuring to 2.7 cm in size. No other acute upper abdominal abnormality. Musculoskeletal: Interval placement a T1-T6 posterior thoracic spinal fusion bridging the destructive pathologic fractures of the T3 and T4 vertebral bodies with transpedicular screws at the T1, T2, T5 and T6 levels. Paired posterior struts are noted on chest radiography. No acute hardware complication is seen. Hardware does result in significant streak artifact which limits evaluation of osseous structures and  adjacent soft tissues. Destructive changes of the right chest wall and third and fourth ribs, as detailed above. Extension into the canal is difficult to assess on this examination. Review of the MIP images confirms the above findings. IMPRESSION: 1. No evidence of acute pulmonary embolism. 2. Redemonstration of multiple right upper lobe pulmonary masses with associated bony destruction of the T3 and T4 vertebral bodies and third and fourth ribs posteriorly as well as gross invasion of the posterior chest wall. The degree of extension into the spinal canal is less well visualized on this study due to streak from interval placement of a T1-T6 spinal fusion. 3. Multiple peripherally enhancing lesions seen throughout the right and left lobe liver, largest in the right lobe measuring to 2.7 cm in size, compatible with metastatic disease. 4. No new pulmonary nodules or masses. No acute superimposed intrathoracic process. 5. Aortic Atherosclerosis (ICD10-I70.0) 6. Emphysema (ICD10-J43.9). Electronically Signed   By: Lovena Le M.D.   On: 11/29/2019 03:56   NM PET Image Initial (PI) Skull Base To Thigh  Result Date: 12/06/2019 CLINICAL DATA:  Initial treatment strategy for lung cancer post spinal fusion for thoracic spine involvement. EXAM: NUCLEAR MEDICINE PET SKULL BASE TO THIGH TECHNIQUE: 6.51 mCi F-18 FDG was injected intravenously. Full-ring PET imaging was performed from the skull base to thigh after the radiotracer. CT data was obtained and used for attenuation correction and anatomic localization. Fasting blood glucose: 96 mg/dl COMPARISON:  CT chest, abdomen and pelvis of 10/31/2019, CT angiography of the chest of 11/29/2019 FINDINGS: Mediastinal blood pool activity: SUV max 1.39 Liver activity: SUV max NA NECK: No hypermetabolic lymph nodes in the neck. Incidental CT findings: none CHEST: Soft tissue mass along the RIGHT lung apex destroying portions of the RIGHT second and third ribs and associated with  multiple levels of the thoracic spine now post spinal fusion about the T3 level extending from T1, spanning T3 and T4 with distal pedicle screw placement at T5-T6. Soft tissue mass along the RIGHT paraspinal border measuring 5.9 x 2.4 cm (image 77, series 3) (SUVmax = 12.8, contiguous with soft tissue that extends posteriorly destroying posterior ribs 3  and 4. Pleural thickening along the anterior RIGHT upper lobe contiguous with pulmonary lesion. Lesion extending into the parenchyma measuring 2.2 x 2.2 cm (image 86, series 3) (SUVmax = 5.4. Pleural thickening extends away from the parenchymal portion at least 2 cm. Increased FDG uptake is also noted along intercostal musculature and associated with rib destruction in the posterior RIGHT upper chest. Soft tissue and increased FDG uptake crosses the midline at the T3 and T4 level with slightly less FDG uptake than the greatest area of uptake along the RIGHT of the spine. No mediastinal adenopathy. No hilar adenopathy. No subcarinal adenopathy. No axillary lymphadenopathy.) Incidental CT findings: Severe pulmonary emphysema. Small RIGHT-sided pleural effusion without nodular areas of FDG avidity. Airways are patent. Calcified atherosclerotic changes of the thoracic aorta. Calcified coronary artery disease. ABDOMEN/PELVIS: Multifocal areas of FDG uptake in the liver 6 in total compatible with metastatic disease also seen on the recent chest abdomen and pelvis CT dimensions appears similar, better seen on previous exam. The largest area in the lateral section of the LEFT hepatic lobe (image 156 of series 3 (SUVmax = 13.2) greatest axial dimension of 4.1 cm as measured based on FDG uptake, this area measured approximately 4.2 cm on the previous CT. Largest area in the RIGHT hepatic lobe with area of uptake approximately 5.2 cm (image 179, series 3) (SUVmax = approximately 11.5, contiguous with an adjacent smaller area that was seen on the previous CT. Other areas display  similar FDG uptake. No abnormal uptake in the adrenal glands. No visible adenopathy.) Incidental CT findings: Liver as described. Spleen without suspicious finding. Limited assessment of pancreas is grossly normal. No hydronephrosis. No signs of acute bowel process limited assessment due to respiratory motion and low-dose on the CT evaluation. Calcified atherosclerotic changes of the abdominal aorta tracking in the iliac vasculature. SKELETON: Destructive changes in the spine associated with the RIGHT upper lobe and perispinal mass as described. Also associated with rib destruction also described above. Incidental CT findings: Multilevel laminectomy associated with the spinal fusion as seen on previous studies. Spinal degenerative changes. IMPRESSION: 1. Signs of bony destruction and large pleural and paraspinal mass in the RIGHT upper lobe with a second smaller area of disease in the peripheral anterior RIGHT upper lobe as described. 2. Signs of spinal fusion and laminectomy associated with T3-T4 destruction also with adjacent destruction of ribs as discussed. 3. Multifocal hepatic metastatic disease. 4. Aortic Atherosclerosis (ICD10-I70.0) and Emphysema (ICD10-J43.9). Electronically Signed   By: Zetta Bills M.D.   On: 12/06/2019 16:51   DG Chest Portable 1 View  Result Date: 11/29/2019 CLINICAL DATA:  Shortness of breath for 2-3 days, history of lung cancer EXAM: PORTABLE CHEST 1 VIEW COMPARISON:  CT 10/31/2019, radiograph 05/08/2017 FINDINGS: Redemonstration of the spiculated mass in the right lung apex with associated destructive changes of the posterior right third rib and upper thoracic vertebrae bridged by the vertebral fusion rods. Findings on a background of severe centrilobular and paraseptal emphysematous changes better demonstrated on comparison cross-sectional imaging. Chronic bronchitic features are present as well. No new consolidative opacity. No convincing features of pulmonary edema.  Cardiomediastinal contours are stable. IMPRESSION: 1. Redemonstrated masses towards the right lung apex with associated destructive changes of the posterior right third rib and upper thoracic vertebrae bridged by hardware. 2. No acute cardiopulmonary abnormality. 3. Chronic bronchitic and emphysematous changes. Electronically Signed   By: Lovena Le M.D.   On: 11/29/2019 03:16    ASSESSMENT & PLAN:   Cancer  of upper lobe of right lung ([place]) # Stage IV metastatic non-small cell lung cancer favor adeno; mets to bone; liver; NGS pending.  PET scan May 2021-shows multiple liver lesions; lung lesions bone lesions.  Awaiting foundation 1  #Recommend proceeding with Keytruda single agent [given borderline performance status; ongoing radiation] given the presence of liver lesions/lung lesion.  Understands treatments are palliative not curative.  Again reviewed the potential side effects including but not limited to diarrhea; pneumonitis skin rash etc.   #Bone metastases; spinal cord compression status post decompressive surgery and fusion; discussed regarding Zometa; calcium today is 8.6 per recommend calcium plus vitamin D.  Will start Zometa next visit.  #Malnutrition/cachexia-continue weight loss/underlying malignancy.  S/p nutrition evaluation.  Will monitor closely.  # COPD-stable Trelegy.  #Pain-underlying malignancy; stable continue tramadol/Tylenol.  #Insomnia-continue Seroquel.  Stable  # DISPOSITION: # proceed with [place] today; HOLD Zometa # follow up in 3 weeks- MD;labs- cbc/cmp; carbo-alimta- Keytruda;  Dr.B   All questions were answered. The patient knows to call the clinic with any problems, questions or concerns.    Cammie Sickle, MD 12/21/2019 1:09 PM

## 2019-12-21 NOTE — Progress Notes (Signed)
Pt and daughter in for follow up, pt wearing oxygen at 2l/m per nasal cannula.  States has a sore throat from radiation at times.

## 2019-12-21 NOTE — Assessment & Plan Note (Addendum)
#   Stage IV metastatic non-small cell lung cancer favor adeno; mets to bone; liver; NGS pending.  PET scan May 2021-shows multiple liver lesions; lung lesions bone lesions.  Awaiting foundation 1  #Recommend proceeding with Keytruda single agent [given borderline performance status; ongoing radiation] given the presence of liver lesions/lung lesion.  Understands treatments are palliative not curative.  Again reviewed the potential side effects including but not limited to diarrhea; pneumonitis skin rash etc.   #Bone metastases; spinal cord compression status post decompressive surgery and fusion; discussed regarding Zometa; calcium today is 8.6 per recommend calcium plus vitamin D.  Will start Zometa next visit.  #Malnutrition/cachexia-continue weight loss/underlying malignancy.  S/p nutrition evaluation.  Will monitor closely.  # COPD-stable Trelegy.  #Pain-underlying malignancy; stable continue tramadol/Tylenol.  #Insomnia-continue Seroquel.  Stable  # DISPOSITION: # proceed with [place] today; HOLD Zometa # follow up in 3 weeks- MD;labs- cbc/cmp; carbo-alimta- Keytruda;  Dr.B

## 2019-12-22 ENCOUNTER — Ambulatory Visit
Admission: RE | Admit: 2019-12-22 | Discharge: 2019-12-22 | Disposition: A | Discharge status: Home / Self Care | Payer: Medicare HMO | Source: Ambulatory Visit | Attending: Radiation Oncology | Admitting: Radiation Oncology

## 2019-12-22 ENCOUNTER — Telehealth: Payer: Self-pay

## 2019-12-22 DIAGNOSIS — Z510 Encounter for antineoplastic radiation therapy: Secondary | ICD-10-CM | POA: Diagnosis not present

## 2019-12-22 LAB — CEA: CEA: 2543 ng/mL — ABNORMAL HIGH (ref 0.0–4.7)

## 2019-12-22 NOTE — Telephone Encounter (Signed)
T/C to pt for follow up after receiving first infusion yesterday.   No answer but left message that I was calling to see how he did yesterday with his infusion and see how he was feeling today.   Encouraged pt to call for any quesitons or concersn

## 2019-12-23 ENCOUNTER — Ambulatory Visit
Admission: RE | Admit: 2019-12-23 | Discharge: 2019-12-23 | Disposition: A | Discharge status: Home / Self Care | Payer: Medicare HMO | Source: Ambulatory Visit | Attending: Radiation Oncology | Admitting: Radiation Oncology

## 2019-12-23 ENCOUNTER — Encounter: Payer: Self-pay | Admitting: Internal Medicine

## 2019-12-23 DIAGNOSIS — M5134 Other intervertebral disc degeneration, thoracic region: Secondary | ICD-10-CM | POA: Diagnosis not present

## 2019-12-23 DIAGNOSIS — Z510 Encounter for antineoplastic radiation therapy: Secondary | ICD-10-CM | POA: Diagnosis not present

## 2019-12-23 DIAGNOSIS — R918 Other nonspecific abnormal finding of lung field: Secondary | ICD-10-CM | POA: Diagnosis not present

## 2019-12-23 DIAGNOSIS — C7951 Secondary malignant neoplasm of bone: Secondary | ICD-10-CM | POA: Diagnosis not present

## 2019-12-23 DIAGNOSIS — M5136 Other intervertebral disc degeneration, lumbar region: Secondary | ICD-10-CM | POA: Diagnosis not present

## 2019-12-23 DIAGNOSIS — Z981 Arthrodesis status: Secondary | ICD-10-CM | POA: Diagnosis not present

## 2019-12-27 ENCOUNTER — Ambulatory Visit
Admission: RE | Admit: 2019-12-27 | Discharge: 2019-12-27 | Disposition: A | Discharge status: Home / Self Care | Payer: Medicare HMO | Source: Ambulatory Visit | Attending: Radiation Oncology | Admitting: Radiation Oncology

## 2019-12-27 DIAGNOSIS — Z510 Encounter for antineoplastic radiation therapy: Secondary | ICD-10-CM | POA: Insufficient documentation

## 2019-12-27 DIAGNOSIS — C3411 Malignant neoplasm of upper lobe, right bronchus or lung: Secondary | ICD-10-CM | POA: Diagnosis not present

## 2019-12-27 DIAGNOSIS — C7951 Secondary malignant neoplasm of bone: Secondary | ICD-10-CM | POA: Insufficient documentation

## 2019-12-27 DIAGNOSIS — C787 Secondary malignant neoplasm of liver and intrahepatic bile duct: Secondary | ICD-10-CM | POA: Diagnosis not present

## 2019-12-28 ENCOUNTER — Ambulatory Visit
Admission: RE | Admit: 2019-12-28 | Discharge: 2019-12-28 | Disposition: A | Discharge status: Home / Self Care | Payer: Medicare HMO | Source: Ambulatory Visit | Attending: Radiation Oncology | Admitting: Radiation Oncology

## 2019-12-28 DIAGNOSIS — C7951 Secondary malignant neoplasm of bone: Secondary | ICD-10-CM | POA: Diagnosis not present

## 2019-12-28 DIAGNOSIS — C3411 Malignant neoplasm of upper lobe, right bronchus or lung: Secondary | ICD-10-CM | POA: Diagnosis not present

## 2019-12-28 DIAGNOSIS — C787 Secondary malignant neoplasm of liver and intrahepatic bile duct: Secondary | ICD-10-CM | POA: Diagnosis not present

## 2019-12-28 DIAGNOSIS — Z510 Encounter for antineoplastic radiation therapy: Secondary | ICD-10-CM | POA: Diagnosis not present

## 2019-12-29 ENCOUNTER — Inpatient Hospital Stay: Payer: Medicare HMO | Attending: Internal Medicine

## 2019-12-29 DIAGNOSIS — Z801 Family history of malignant neoplasm of trachea, bronchus and lung: Secondary | ICD-10-CM | POA: Insufficient documentation

## 2019-12-29 DIAGNOSIS — R0982 Postnasal drip: Secondary | ICD-10-CM | POA: Insufficient documentation

## 2019-12-29 DIAGNOSIS — N400 Enlarged prostate without lower urinary tract symptoms: Secondary | ICD-10-CM | POA: Insufficient documentation

## 2019-12-29 DIAGNOSIS — Z87891 Personal history of nicotine dependence: Secondary | ICD-10-CM | POA: Insufficient documentation

## 2019-12-29 DIAGNOSIS — R0989 Other specified symptoms and signs involving the circulatory and respiratory systems: Secondary | ICD-10-CM | POA: Insufficient documentation

## 2019-12-29 DIAGNOSIS — Z5112 Encounter for antineoplastic immunotherapy: Secondary | ICD-10-CM | POA: Insufficient documentation

## 2019-12-29 DIAGNOSIS — C787 Secondary malignant neoplasm of liver and intrahepatic bile duct: Secondary | ICD-10-CM | POA: Insufficient documentation

## 2019-12-29 DIAGNOSIS — Z8249 Family history of ischemic heart disease and other diseases of the circulatory system: Secondary | ICD-10-CM | POA: Insufficient documentation

## 2019-12-29 DIAGNOSIS — Z923 Personal history of irradiation: Secondary | ICD-10-CM | POA: Insufficient documentation

## 2019-12-29 DIAGNOSIS — R64 Cachexia: Secondary | ICD-10-CM | POA: Insufficient documentation

## 2019-12-29 DIAGNOSIS — G8220 Paraplegia, unspecified: Secondary | ICD-10-CM | POA: Insufficient documentation

## 2019-12-29 DIAGNOSIS — Z9981 Dependence on supplemental oxygen: Secondary | ICD-10-CM | POA: Insufficient documentation

## 2019-12-29 DIAGNOSIS — I2510 Atherosclerotic heart disease of native coronary artery without angina pectoris: Secondary | ICD-10-CM | POA: Insufficient documentation

## 2019-12-29 DIAGNOSIS — R5381 Other malaise: Secondary | ICD-10-CM | POA: Insufficient documentation

## 2019-12-29 DIAGNOSIS — C3411 Malignant neoplasm of upper lobe, right bronchus or lung: Secondary | ICD-10-CM | POA: Insufficient documentation

## 2019-12-29 DIAGNOSIS — Z7401 Bed confinement status: Secondary | ICD-10-CM | POA: Insufficient documentation

## 2019-12-29 DIAGNOSIS — I700 Atherosclerosis of aorta: Secondary | ICD-10-CM | POA: Insufficient documentation

## 2019-12-29 DIAGNOSIS — J449 Chronic obstructive pulmonary disease, unspecified: Secondary | ICD-10-CM | POA: Insufficient documentation

## 2019-12-29 DIAGNOSIS — G4700 Insomnia, unspecified: Secondary | ICD-10-CM | POA: Insufficient documentation

## 2019-12-29 DIAGNOSIS — Z79899 Other long term (current) drug therapy: Secondary | ICD-10-CM | POA: Insufficient documentation

## 2019-12-29 DIAGNOSIS — C7951 Secondary malignant neoplasm of bone: Secondary | ICD-10-CM | POA: Insufficient documentation

## 2019-12-29 DIAGNOSIS — J90 Pleural effusion, not elsewhere classified: Secondary | ICD-10-CM | POA: Insufficient documentation

## 2019-12-29 DIAGNOSIS — Z7289 Other problems related to lifestyle: Secondary | ICD-10-CM | POA: Insufficient documentation

## 2019-12-29 DIAGNOSIS — E46 Unspecified protein-calorie malnutrition: Secondary | ICD-10-CM | POA: Insufficient documentation

## 2019-12-29 NOTE — Progress Notes (Signed)
Nutrition Follow-up:  Patient with lung cancer, liver mets, spinal cord compression/vertebral mets.  S/p T1-T6 laminectomy with posterior fusion at [facility] on 4/12.  Patient has completed radiation to thoracic spine T1-T5.    Spoke with patient via phone for nutrition follow-up.  Patient reports that appetite is good.  Slept late so ate more of a lunch of peanut butter and jelly wrap and drank ensure. Has not eaten anything else today.  Went to appointment.  Reports trying to drink 2 ensure shakes per day.  Reports diary foods especially ice cream seems to be causing diarrhea.  Has stopped miralax at this time.      Medications: reviewed  Labs: reviewed  Anthropometrics:   Weight 115 lb slight increase from weight of 114 lb on 5/5.  Noted weight 121 lb 5/13 (?? Accuracy)   NUTRITION DIAGNOSIS: Inadequate oral intake stable    INTERVENTION:  Patient tried all shakes and liked ensure/boost the best.  Will increase to TID for added calories. Encouraged patient to eat q 2 hours.  Take snacks/shakes with him to appointments Continue to monitor loose stools.  Patient reports improved. Patient has contact information    MONITORING, EVALUATION, GOAL: weight, intake   NEXT VISIT: June 28 phone f/u (Monday)  Cody [name] B. Zenia Resides, [place], [place] Registered Dietitian [phone] (pager)

## 2019-12-30 ENCOUNTER — Telehealth: Payer: Self-pay | Admitting: *Deleted

## 2019-12-30 ENCOUNTER — Ambulatory Visit
Admission: RE | Admit: 2019-12-30 | Discharge: 2019-12-30 | Disposition: A | Discharge status: Home / Self Care | Payer: Medicare HMO | Attending: Hospice and Palliative Medicine | Admitting: Hospice and Palliative Medicine

## 2019-12-30 ENCOUNTER — Other Ambulatory Visit: Payer: Self-pay

## 2019-12-30 ENCOUNTER — Other Ambulatory Visit: Payer: Self-pay | Admitting: *Deleted

## 2019-12-30 ENCOUNTER — Ambulatory Visit
Admission: RE | Admit: 2019-12-30 | Discharge: 2019-12-30 | Disposition: A | Discharge status: Home / Self Care | Payer: Medicare HMO | Source: Ambulatory Visit | Attending: Hospice and Palliative Medicine | Admitting: Hospice and Palliative Medicine

## 2019-12-30 ENCOUNTER — Inpatient Hospital Stay (HOSPITAL_BASED_OUTPATIENT_CLINIC_OR_DEPARTMENT_OTHER): Payer: Medicare HMO | Admitting: Hospice and Palliative Medicine

## 2019-12-30 VITALS — BP 122/100 | HR 91 | Temp 96.9°F | Resp 20

## 2019-12-30 DIAGNOSIS — C3410 Malignant neoplasm of upper lobe, unspecified bronchus or lung: Secondary | ICD-10-CM

## 2019-12-30 DIAGNOSIS — Z7401 Bed confinement status: Secondary | ICD-10-CM | POA: Diagnosis not present

## 2019-12-30 DIAGNOSIS — E46 Unspecified protein-calorie malnutrition: Secondary | ICD-10-CM | POA: Diagnosis not present

## 2019-12-30 DIAGNOSIS — G8220 Paraplegia, unspecified: Secondary | ICD-10-CM | POA: Diagnosis not present

## 2019-12-30 DIAGNOSIS — C787 Secondary malignant neoplasm of liver and intrahepatic bile duct: Secondary | ICD-10-CM | POA: Diagnosis not present

## 2019-12-30 DIAGNOSIS — R05 Cough: Secondary | ICD-10-CM | POA: Insufficient documentation

## 2019-12-30 DIAGNOSIS — R059 Cough, unspecified: Secondary | ICD-10-CM

## 2019-12-30 DIAGNOSIS — G4700 Insomnia, unspecified: Secondary | ICD-10-CM | POA: Diagnosis not present

## 2019-12-30 DIAGNOSIS — C7951 Secondary malignant neoplasm of bone: Secondary | ICD-10-CM | POA: Diagnosis not present

## 2019-12-30 DIAGNOSIS — I700 Atherosclerosis of aorta: Secondary | ICD-10-CM | POA: Diagnosis not present

## 2019-12-30 DIAGNOSIS — Z801 Family history of malignant neoplasm of trachea, bronchus and lung: Secondary | ICD-10-CM | POA: Diagnosis not present

## 2019-12-30 DIAGNOSIS — R64 Cachexia: Secondary | ICD-10-CM | POA: Diagnosis not present

## 2019-12-30 DIAGNOSIS — Z8249 Family history of ischemic heart disease and other diseases of the circulatory system: Secondary | ICD-10-CM | POA: Diagnosis not present

## 2019-12-30 DIAGNOSIS — J90 Pleural effusion, not elsewhere classified: Secondary | ICD-10-CM | POA: Diagnosis not present

## 2019-12-30 DIAGNOSIS — R0989 Other specified symptoms and signs involving the circulatory and respiratory systems: Secondary | ICD-10-CM | POA: Diagnosis not present

## 2019-12-30 DIAGNOSIS — J449 Chronic obstructive pulmonary disease, unspecified: Secondary | ICD-10-CM | POA: Diagnosis not present

## 2019-12-30 DIAGNOSIS — N400 Enlarged prostate without lower urinary tract symptoms: Secondary | ICD-10-CM | POA: Diagnosis not present

## 2019-12-30 DIAGNOSIS — Z9981 Dependence on supplemental oxygen: Secondary | ICD-10-CM | POA: Diagnosis not present

## 2019-12-30 DIAGNOSIS — I2510 Atherosclerotic heart disease of native coronary artery without angina pectoris: Secondary | ICD-10-CM | POA: Diagnosis not present

## 2019-12-30 DIAGNOSIS — C3411 Malignant neoplasm of upper lobe, right bronchus or lung: Secondary | ICD-10-CM | POA: Diagnosis present

## 2019-12-30 DIAGNOSIS — Z923 Personal history of irradiation: Secondary | ICD-10-CM | POA: Diagnosis not present

## 2019-12-30 DIAGNOSIS — R5381 Other malaise: Secondary | ICD-10-CM | POA: Diagnosis not present

## 2019-12-30 DIAGNOSIS — Z5112 Encounter for antineoplastic immunotherapy: Secondary | ICD-10-CM | POA: Diagnosis present

## 2019-12-30 DIAGNOSIS — Z7289 Other problems related to lifestyle: Secondary | ICD-10-CM | POA: Diagnosis not present

## 2019-12-30 DIAGNOSIS — Z87891 Personal history of nicotine dependence: Secondary | ICD-10-CM | POA: Diagnosis not present

## 2019-12-30 DIAGNOSIS — Z79899 Other long term (current) drug therapy: Secondary | ICD-10-CM | POA: Diagnosis not present

## 2019-12-30 DIAGNOSIS — R0982 Postnasal drip: Secondary | ICD-10-CM | POA: Diagnosis not present

## 2019-12-30 MED ORDER — BENZONATATE 100 MG PO CAPS
100.0000 mg | ORAL_CAPSULE | Freq: Three times a day (TID) | ORAL | 0 refills | Status: DC | PRN
Start: 2019-12-30 — End: 2020-01-11

## 2019-12-30 NOTE — Telephone Encounter (Signed)
Raul Del, RN will reach out to patient this morning

## 2019-12-30 NOTE — Telephone Encounter (Signed)
Please offer patient [facility] appointment

## 2019-12-30 NOTE — Telephone Encounter (Signed)
Spoke to patient via telephone. He agreed to go to get a chest x ray at 12:30, and then come here to be evaluated by Billey Chang, NP at 1:00.

## 2019-12-30 NOTE — Telephone Encounter (Signed)
Patient called reporting that he has a cough that kept him uo all night and that he is congested. He denies fever or shortness of breath. He reports that he had a Keytruda infusion a week and a half ago and is asking if it is coming form that. He states he is coughing mucous, but it does not come up enough to expectorate it. He is asking if he needs to come in to be checked. Please advise

## 2019-12-30 NOTE — Progress Notes (Signed)
Symptom Management [place]  [phone] [phone]  Patient Care Team: Steele Sizer, MD as PCP - General (Family Medicine) Christene Lye, MD (General Surgery) Telford Nab, RN as Oncology Nurse Navigator   Name of the patient: Cody [name]  [id number]  [birth year]   Date of visit: 12/30/19  Reason for Consult: Cody [name] is a 64 y.o. 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  Patient was hospitalized from 11/29/2019 to 11/30/2019 with hypoxic respiratory failure.  He was discharged home on O2.    Patient utilizes Trelegy at home.  He is currently on treatment with single agent Keytruda given his relatively poor performance status.  Patient presents to the [facility] for evaluation of a nonproductive cough since yesterday.  No fever or shortness of breath.  No wheezing.  Patient is monitoring his pulse oximetry in the home and denies any hypoxia.  He continues on 2 L O2 via nasal cannula.  Patient says that he has 2 days of runny nose and nasal congestion with postnasal drip.  This is triggering his is coughing. He has been taking Mucinex but has not found it to be particularly helpful.  Coughing is impairing his sleep.  Denies chest pain.  Denies nausea vomiting or diarrhea.  No further symptomatic complaints today.  PAST MEDICAL HISTORY: Past Medical History:  Diagnosis Date   Allergy    COPD (chronic obstructive pulmonary disease) ([place])    Prostate enlargement     PAST SURGICAL HISTORY:  Past Surgical History:  Procedure Laterality Date   HERNIA REPAIR Bilateral 1982   HERNIA REPAIR Right 1994   LAMINECTOMY FOR EXCISION / EVACUATION INTRASPINAL LESION  11/07/2019   T1-T 6 at [facility]      HEMATOLOGY/ONCOLOGY HISTORY:  Oncology History Overview Note  # April 2021- RUL lung cancer; liver met; spinal cord compression/ vertebral mets [[facility]]; [[facility]]; LIVER Bx- Metastatic adenocarcinoma, consistent with lung primary.- TPS % Interpretation -PD-L1 IHC 10 LOW EXPRESSION POSITIVE   # 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   # MAY 26th-Keytrda.   # NGS/MOLECULAR TESTS:   # PALLIATIVE CARE EVALUATION:  # PAIN MANAGEMENT:    DIAGNOSIS:   STAGE:         ;  GOALS:  CURRENT/MOST RECENT THERAPY :     Cancer of upper lobe of right lung ([place])  11/23/2019 Initial Diagnosis   Cancer of upper lobe of right lung ([place])   12/21/2019 -  Chemotherapy   The patient had palonosetron (ALOXI) injection 0.25 mg, 0.25 mg, Intravenous,  Once, 0 of 3 cycles PEMEtrexed (ALIMTA) 800 mg in sodium chloride 0.9 % 100 mL chemo infusion, 500 mg/m2 = 800 mg, Intravenous,  Once, 0 of 5 cycles CARBOplatin (PARAPLATIN) in sodium chloride 0.9 % 100 mL chemo infusion, , Intravenous,  Once, 0 of 3 cycles fosaprepitant (EMEND) 150 mg in sodium chloride 0.9 % 145 mL IVPB, 150 mg, Intravenous,  Once, 0 of 3 cycles pembrolizumab (KEYTRUDA) 200 mg in sodium chloride 0.9 % 50 mL chemo infusion, 200 mg, Intravenous, Once, 1 of 6 cycles Administration: 200 mg (12/21/2019)  for chemotherapy treatment.      ALLERGIES:  has No Known Allergies.  MEDICATIONS:  Current Outpatient Medications  Medication Sig Dispense Refill   acetaminophen (TYLENOL) 500 MG tablet Take 500-1,000 mg by mouth every 6 (six) hours as needed for mild pain or fever.      escitalopram (LEXAPRO) 5 MG tablet Take 1 tablet (5 mg total) by mouth daily. am's 30 tablet 0   Fluticasone-Umeclidin-Vilant 100-62.5-25 MCG/INH AEPB Inhale 1 puff into the lungs daily.      Multiple Vitamins-Minerals (MULTIVITAMIN GUMMIES MENS PO) Take 1  Dose by mouth daily.      ondansetron (ZOFRAN) 4 MG tablet Take 1 tablet (4 mg total) by mouth every 8 (eight) hours as needed for nausea or vomiting. (Patient not taking: Reported on 12/21/2019) 20 tablet 2   prochlorperazine (COMPAZINE) 10 MG tablet Take 1 tablet (10 mg total) by mouth every 6 (six) hours as needed for nausea or vomiting. 40 tablet 1   QUEtiapine (SEROQUEL) 25 MG tablet Take 1 tablet (25 mg total) by mouth at bedtime. 30 tablet 0   No current facility-administered medications for this visit.    VITAL SIGNS: There were no vitals taken for this visit. There were no vitals filed for this visit.  Estimated body mass index is 17.07 kg/m as calculated from the following:   Height as of 12/08/19: '5\' 9"'$  (1.753 m).   Weight as of 12/21/19: 115 lb 9.6 oz (52.4 kg).  LABS: CBC:    Component Value Date/Time   WBC 8.1 12/21/2019 1010   HGB 12.3 (L) 12/21/2019 1010   HGB 15.2 10/21/2019 1517   HCT 38.1 (L) 12/21/2019 1010   HCT 43.0 10/21/2019 1517   PLT 358 12/21/2019 1010   PLT 325 10/21/2019 1517   MCV 89.6 12/21/2019 1010   MCV 86 10/21/2019 1517   NEUTROABS 6.7 12/21/2019 1010   NEUTROABS 5.9 10/21/2019 1517   LYMPHSABS 0.6 (L) 12/21/2019 1010   LYMPHSABS 1.3 10/21/2019 1517   MONOABS 0.7 12/21/2019 1010   EOSABS 0.1 12/21/2019 1010   EOSABS 0.0 10/21/2019 1517   BASOSABS 0.1 12/21/2019 1010   BASOSABS 0.1 10/21/2019 1517   Comprehensive Metabolic Panel:    Component Value Date/Time   NA 135 12/21/2019 1010   NA 135 10/21/2019 1517   K 3.9 12/21/2019 1010   CL 97 (L) 12/21/2019 1010   CO2 30 12/21/2019 1010   BUN 14 12/21/2019 1010   BUN 14 10/21/2019 1517   CREATININE 0.48 (L) 12/21/2019 1010   CREATININE 0.87 03/16/2017 1211   GLUCOSE 181 (H) 12/21/2019 1010   CALCIUM 8.6 (L) 12/21/2019 1010   AST 29 12/21/2019 1010   ALT 26 12/21/2019 1010   ALKPHOS 250 (H) 12/21/2019 1010   BILITOT 0.4 12/21/2019 1010   BILITOT 0.5 10/21/2019 1517   PROT 7.3  12/21/2019 1010   PROT 7.0 10/21/2019 1517   ALBUMIN 3.0 (L) 12/21/2019 1010   ALBUMIN 3.6 (L) 10/21/2019 1517    RADIOGRAPHIC STUDIES: NM PET Image Initial (PI) Skull Base To Thigh  Result Date: 12/06/2019 CLINICAL DATA:  Initial treatment strategy for lung cancer post spinal fusion for thoracic spine involvement. EXAM: NUCLEAR MEDICINE PET SKULL BASE TO THIGH TECHNIQUE: 6.51 mCi F-18 FDG was injected intravenously. Full-ring PET imaging was performed from the skull base to thigh after the radiotracer. CT data was obtained and used for attenuation correction and anatomic localization. Fasting blood glucose: 96 mg/dl COMPARISON:  CT chest, abdomen and pelvis of 10/31/2019, CT angiography of the chest of 11/29/2019 FINDINGS: Mediastinal blood pool activity: SUV max  1.39 Liver activity: SUV max NA NECK: No hypermetabolic lymph nodes in the neck. Incidental CT findings: none CHEST: Soft tissue mass along the RIGHT lung apex destroying portions of the RIGHT second and third ribs and associated with multiple levels of the thoracic spine now post spinal fusion about the T3 level extending from T1, spanning T3 and T4 with distal pedicle screw placement at T5-T6. Soft tissue mass along the RIGHT paraspinal border measuring 5.9 x 2.4 cm (image 77, series 3) (SUVmax = 12.8, contiguous with soft tissue that extends posteriorly destroying posterior ribs 3 and 4. Pleural thickening along the anterior RIGHT upper lobe contiguous with pulmonary lesion. Lesion extending into the parenchyma measuring 2.2 x 2.2 cm (image 86, series 3) (SUVmax = 5.4. Pleural thickening extends away from the parenchymal portion at least 2 cm. Increased FDG uptake is also noted along intercostal musculature and associated with rib destruction in the posterior RIGHT upper chest. Soft tissue and increased FDG uptake crosses the midline at the T3 and T4 level with slightly less FDG uptake than the greatest area of uptake along the RIGHT of the  spine. No mediastinal adenopathy. No hilar adenopathy. No subcarinal adenopathy. No axillary lymphadenopathy.) Incidental CT findings: Severe pulmonary emphysema. Small RIGHT-sided pleural effusion without nodular areas of FDG avidity. Airways are patent. Calcified atherosclerotic changes of the thoracic aorta. Calcified coronary artery disease. ABDOMEN/PELVIS: Multifocal areas of FDG uptake in the liver 6 in total compatible with metastatic disease also seen on the recent chest abdomen and pelvis CT dimensions appears similar, better seen on previous exam. The largest area in the lateral section of the LEFT hepatic lobe (image 156 of series 3 (SUVmax = 13.2) greatest axial dimension of 4.1 cm as measured based on FDG uptake, this area measured approximately 4.2 cm on the previous CT. Largest area in the RIGHT hepatic lobe with area of uptake approximately 5.2 cm (image 179, series 3) (SUVmax = approximately 11.5, contiguous with an adjacent smaller area that was seen on the previous CT. Other areas display similar FDG uptake. No abnormal uptake in the adrenal glands. No visible adenopathy.) Incidental CT findings: Liver as described. Spleen without suspicious finding. Limited assessment of pancreas is grossly normal. No hydronephrosis. No signs of acute bowel process limited assessment due to respiratory motion and low-dose on the CT evaluation. Calcified atherosclerotic changes of the abdominal aorta tracking in the iliac vasculature. SKELETON: Destructive changes in the spine associated with the RIGHT upper lobe and perispinal mass as described. Also associated with rib destruction also described above. Incidental CT findings: Multilevel laminectomy associated with the spinal fusion as seen on previous studies. Spinal degenerative changes. IMPRESSION: 1. Signs of bony destruction and large pleural and paraspinal mass in the RIGHT upper lobe with a second smaller area of disease in the peripheral anterior RIGHT  upper lobe as described. 2. Signs of spinal fusion and laminectomy associated with T3-T4 destruction also with adjacent destruction of ribs as discussed. 3. Multifocal hepatic metastatic disease. 4. Aortic Atherosclerosis (ICD10-I70.0) and Emphysema (ICD10-J43.9). Electronically Signed   By: Zetta Bills M.D.   On: 12/06/2019 16:51    PERFORMANCE STATUS (ECOG) : 2 - Symptomatic, <50% confined to bed  Review of Systems Unless otherwise noted, a complete review of systems is negative.  Physical Exam General: NAD HEENT: Clear nasal mucosa, mucus present OP, no cervical adenopathy Cardiovascular: regular rate and rhythm Pulmonary: clear ant/posterior fields, on O2 Abdomen: soft, nontender, + bowel sounds GU: no suprapubic tenderness Extremities: no  edema, no joint deformities Skin: no rashes Neurological: Weakness but otherwise nonfocal  Assessment and Plan- Patient is a 64 y.o. male including O2 dependent COPD and stage IV non-small cell lung cancer who presents to the clinic today for evaluation and management of a nonproductive cough for the past 24 hours.  Patient was accompanied by his wife.  Cough -appears to have component of postnasal drip.  Suspect viral or allergic rhinitis.  Chest x-ray did not reveal any acute abnormalities.  Patient does not appear that he would benefit from antibiotics at the present time, although I would have a low threshold for initiation should his symptoms worsen.  I suggested that he try OTC symptomatic management with loratadine, nasal decongestant, and Mucinex.  Will also prescribe benzonatate for cough but could escalate to codeine-based cough syrup if necessary.  Discussed utilization of ER or urgent care in the setting of decline.  Case and plan discussed with Dr. Rogue Bussing who also reviewed patient's chest x-ray    Patient expressed understanding and was in agreement with this plan. He also understands that He can call clinic at any time with any  questions, concerns, or complaints.   Thank you for allowing me to participate in the care of this very pleasant patient.   Time Total: 20 minutes  Visit consisted of counseling and education dealing with the complex and emotionally intense issues of symptom management and palliative care in the setting of serious and potentially life-threatening illness.Greater than 50%  of this time was spent counseling and coordinating care related to the above assessment and plan.  Signed by: Altha Harm, PhD, NP-C

## 2019-12-30 NOTE — Telephone Encounter (Signed)
Orders entered in Epic for chest x ray.

## 2019-12-31 DIAGNOSIS — J9601 Acute respiratory failure with hypoxia: Secondary | ICD-10-CM | POA: Diagnosis not present

## 2019-12-31 DIAGNOSIS — J441 Chronic obstructive pulmonary disease with (acute) exacerbation: Secondary | ICD-10-CM | POA: Diagnosis not present

## 2020-01-02 DIAGNOSIS — C3410 Malignant neoplasm of upper lobe, unspecified bronchus or lung: Secondary | ICD-10-CM | POA: Diagnosis not present

## 2020-01-03 ENCOUNTER — Encounter: Payer: Self-pay | Admitting: Internal Medicine

## 2020-01-03 DIAGNOSIS — C7951 Secondary malignant neoplasm of bone: Secondary | ICD-10-CM | POA: Diagnosis not present

## 2020-01-03 DIAGNOSIS — R269 Unspecified abnormalities of gait and mobility: Secondary | ICD-10-CM | POA: Diagnosis not present

## 2020-01-03 DIAGNOSIS — R531 Weakness: Secondary | ICD-10-CM | POA: Diagnosis not present

## 2020-01-03 DIAGNOSIS — R293 Abnormal posture: Secondary | ICD-10-CM | POA: Diagnosis not present

## 2020-01-03 DIAGNOSIS — M2560 Stiffness of unspecified joint, not elsewhere classified: Secondary | ICD-10-CM | POA: Diagnosis not present

## 2020-01-03 DIAGNOSIS — Z981 Arthrodesis status: Secondary | ICD-10-CM | POA: Diagnosis not present

## 2020-01-05 ENCOUNTER — Telehealth: Payer: Self-pay

## 2020-01-05 ENCOUNTER — Telehealth: Payer: Self-pay | Admitting: Family Medicine

## 2020-01-05 ENCOUNTER — Encounter: Payer: Self-pay | Admitting: Family Medicine

## 2020-01-05 ENCOUNTER — Ambulatory Visit (INDEPENDENT_AMBULATORY_CARE_PROVIDER_SITE_OTHER): Payer: Medicare HMO | Admitting: Family Medicine

## 2020-01-05 ENCOUNTER — Other Ambulatory Visit: Payer: Self-pay

## 2020-01-05 VITALS — BP 116/62 | HR 87 | Temp 97.9°F | Resp 14 | Ht 67.0 in | Wt 118.5 lb

## 2020-01-05 DIAGNOSIS — E440 Moderate protein-calorie malnutrition: Secondary | ICD-10-CM

## 2020-01-05 DIAGNOSIS — C7951 Secondary malignant neoplasm of bone: Secondary | ICD-10-CM

## 2020-01-05 DIAGNOSIS — G4700 Insomnia, unspecified: Secondary | ICD-10-CM

## 2020-01-05 DIAGNOSIS — F419 Anxiety disorder, unspecified: Secondary | ICD-10-CM

## 2020-01-05 DIAGNOSIS — J432 Centrilobular emphysema: Secondary | ICD-10-CM

## 2020-01-05 DIAGNOSIS — R69 Illness, unspecified: Secondary | ICD-10-CM | POA: Diagnosis not present

## 2020-01-05 DIAGNOSIS — I700 Atherosclerosis of aorta: Secondary | ICD-10-CM

## 2020-01-05 DIAGNOSIS — J069 Acute upper respiratory infection, unspecified: Secondary | ICD-10-CM | POA: Diagnosis not present

## 2020-01-05 DIAGNOSIS — C3410 Malignant neoplasm of upper lobe, unspecified bronchus or lung: Secondary | ICD-10-CM

## 2020-01-05 DIAGNOSIS — F341 Dysthymic disorder: Secondary | ICD-10-CM

## 2020-01-05 MED ORDER — TRELEGY ELLIPTA 100-62.5-25 MCG/INH IN AEPB: 1.0000 | INHALATION_SPRAY | Freq: Every day | RESPIRATORY_TRACT | 2 refills | Status: DC

## 2020-01-05 MED ORDER — HYDROCOD POLST-CPM POLST ER 10-8 MG/5ML PO SUER: 5.0000 mL | Freq: Two times a day (BID) | ORAL | 0 refills | Status: DC | PRN

## 2020-01-05 MED ORDER — QUETIAPINE FUMARATE 25 MG PO TABS: 25.0000 mg | ORAL_TABLET | Freq: Every day | ORAL | 1 refills | Status: DC

## 2020-01-05 MED ORDER — ESCITALOPRAM OXALATE 5 MG PO TABS: 5.0000 mg | ORAL_TABLET | Freq: Every day | ORAL | 0 refills | Status: DC

## 2020-01-05 MED ORDER — ALBUTEROL SULFATE HFA 108 (90 BASE) MCG/ACT IN AERS: 2.0000 | INHALATION_SPRAY | RESPIRATORY_TRACT | 0 refills | Status: DC | PRN

## 2020-01-05 NOTE — Telephone Encounter (Signed)
Colletta [place], fromn [facility], called stating that the pt is declining any other [place] services. Please advise.    [phone]

## 2020-01-05 NOTE — Progress Notes (Signed)
Name: Cody [name]   MRN: [id number]    DOB: 09-21-1955   Date:01/05/2020       Progress Note  Subjective  Chief Complaint  Chief Complaint  Patient presents with   Follow-up   Medication Refill    HPI  Depression.Anxiety: he is doing well on Seroquel for sleep takes about 30 minutes, he is doing much better with lexapro in am's, and it has really helped with his mood. Family members have noticed an improvement . He has good bonderies.  Lung Cancer/Emphysema/hypoxia/on oxygen: he finished radiation therapy, going now for chemo and immunotherapy, tolerated it well, he is on 2 liters of oxygen, only walking with a walker inside the house, he is getting PT - graduated from home PT and is getting outpatient PT twice a week. 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 He is out of Trelegy for the past few days and would like refill. He has not used the rescue inhaler for a long time.   URI: productive cough over the past week, having postnasal drainage, seen by oncologist, had CXR was given cough medication but still not controlling symptoms we will add Tussionex, no increase of SOB or wheezing, no fever or chills.   Malnutrition: he gained 8 lbs since last visit 3 weeks ago, he has a good appetite, no nausea , vomiting.   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 ([place]) 11/29/2019   COPD with acute exacerbation ([place]) 11/29/2019   Cancer of upper lobe of right lung ([place]) 11/23/2019   Metastatic cancer to spine ([place]) 11/01/2019   BPH with elevated PSA 07/16/2015   COPD bronchitis 07/16/2015   Seasonal and perennial allergic rhinitis  04/24/2010    Past Surgical History:  Procedure Laterality Date   HERNIA REPAIR Bilateral 1982   HERNIA REPAIR Right 1994   LAMINECTOMY FOR EXCISION / EVACUATION INTRASPINAL LESION  11/07/2019   T1-T 6 at 42     Family History  Problem Relation Age of Onset   Heart disease Father    CVA Mother    Lung cancer Sister    Lung cancer Brother     Social History   Tobacco Use   Smoking status: Former Smoker    Packs/day: 2.00    Years: 30.00    Pack years: 60.00    Quit date: 07/29/2011    Years since quitting: 8.4   Smokeless tobacco: Never Used   Tobacco comment: pt vapes  Substance Use Topics   Alcohol use: Yes    Alcohol/week: 5.0 standard drinks    Types: 5 Standard drinks or equivalent per week    Comment: 1-2/day     Current Outpatient Medications:    acetaminophen (TYLENOL) 500 MG tablet, Take 500-1,000 mg by mouth every 6 (six) hours as needed for mild pain or fever. , Disp: , Rfl:    benzonatate (TESSALON) 100 MG capsule, Take 1 capsule (100 mg total) by mouth 3 (three) times daily as needed for cough., Disp: 20 capsule, Rfl: 0   Calcium Carb-Cholecalciferol (OYSTER SHELL CALCIUM) 500-400 MG-UNIT TABS, Take 500 tablets by mouth., Disp: , Rfl:  escitalopram (LEXAPRO) 5 MG tablet, Take 1 tablet (5 mg total) by mouth daily. am's, Disp: 30 tablet, Rfl: 0   Fluticasone-Umeclidin-Vilant 100-62.5-25 MCG/INH AEPB, Inhale 1 puff into the lungs daily. , Disp: , Rfl:    guaiFENesin (MUCINEX) 600 MG 12 hr tablet, Take by mouth 2 (two) times daily., Disp: , Rfl:    Multiple Vitamins-Minerals (MULTIVITAMIN GUMMIES MENS PO), Take 1 Dose by mouth daily. , Disp: , Rfl:    QUEtiapine (SEROQUEL) 25 MG tablet, Take 1 tablet (25 mg total) by mouth at bedtime., Disp: 30 tablet, Rfl: 0   albuterol (VENTOLIN HFA) 108 (90 Base) MCG/ACT inhaler, Inhale 108 puffs into the lungs. (Patient not taking: Reported on 01/05/2020), Disp: , Rfl:    ondansetron (ZOFRAN) 4 MG  tablet, Take 1 tablet (4 mg total) by mouth every 8 (eight) hours as needed for nausea or vomiting. (Patient not taking: Reported on 12/21/2019), Disp: 20 tablet, Rfl: 2   prochlorperazine (COMPAZINE) 10 MG tablet, Take 1 tablet (10 mg total) by mouth every 6 (six) hours as needed for nausea or vomiting. (Patient not taking: Reported on 01/05/2020), Disp: 40 tablet, Rfl: 1  No Known Allergies  I personally reviewed active problem list, medication list, allergies, family history, social history with the patient/caregiver today.   ROS  Ten systems reviewed and is negative except as mentioned in HPI   Objective  Vitals:   01/05/20 0841  BP: 116/62  Pulse: 87  Resp: 14  Temp: 97.9 F (36.6 C)  TempSrc: Temporal  SpO2: 98%  Weight: 118 lb 8 oz (53.8 kg)  Height: 5\' 7"  (1.702 m)    Body mass index is 18.56 kg/m.  Physical Exam  Constitutional: Patient appears well-developed and malnourished and temporal waisting. No distress.  HEENT: head atraumatic, normocephalic, pupils equal and reactive to light,  neck supple Cardiovascular: Normal rate, regular rhythm and normal heart sounds.  No murmur heard. No BLE edema. Pulmonary/Chest: Effort normal and breath sounds normal. No respiratory distress. Abdominal: Soft.  There is no tenderness. Psychiatric: Patient has a normal mood and affect. behavior is normal. Judgment and thought content normal.  Recent Results (from the past 2160 hour(s))  Comprehensive metabolic panel     Status: Abnormal   Collection Time: 10/21/19  3:17 PM  Result Value Ref Range   Glucose 94 65 - 99 mg/dL   BUN 14 8 - 27 mg/dL   Creatinine, Ser 0.62 (L) 0.76 - 1.27 mg/dL   GFR calc non Af Amer 104 >59 mL/min/1.73   GFR calc Af Amer 120 >59 mL/min/1.73   BUN/Creatinine Ratio 23 10 - 24   Sodium 135 134 - 144 mmol/L   Potassium 4.7 3.5 - 5.2 mmol/L   Chloride 95 (L) 96 - 106 mmol/L   CO2 28 20 - 29 mmol/L   Calcium 9.2 8.6 - 10.2 mg/dL   Total Protein 7.0  6.0 - 8.5 g/dL   Albumin 3.6 (L) 3.8 - 4.8 g/dL   Globulin, Total 3.4 1.5 - 4.5 g/dL   Albumin/Globulin Ratio 1.1 (L) 1.2 - 2.2   Bilirubin Total 0.5 0.0 - 1.2 mg/dL   Alkaline Phosphatase 272 (H) 39 - 117 IU/L   AST 29 0 - 40 IU/L   ALT 23 0 - 44 IU/L  CBC with Differential/Platelet     Status: None   Collection Time: 10/21/19  3:17 PM  Result Value Ref Range   WBC 7.9 3.4 - 10.8 x10E3/uL   RBC 5.02 4.14 -  5.80 x10E6/uL   Hemoglobin 15.2 13.0 - 17.7 g/dL   Hematocrit 43.0 37.5 - 51.0 %   MCV 86 79 - 97 fL   MCH 30.3 26.6 - 33.0 pg   MCHC 35.3 31 - 35 g/dL   RDW 12.3 11.6 - 15.4 %   Platelets 325 150 - 450 x10E3/uL   Neutrophils 75 Not Estab. %   Lymphs 16 Not Estab. %   Monocytes 8 Not Estab. %   Eos 0 Not Estab. %   Basos 1 Not Estab. %   Neutrophils Absolute 5.9 1 - 7 x10E3/uL   Lymphocytes Absolute 1.3 0 - 3 x10E3/uL   Monocytes Absolute 0.6 0 - 0 x10E3/uL   EOS (ABSOLUTE) 0.0 0.0 - 0.4 x10E3/uL   Basophils Absolute 0.1 0 - 0 x10E3/uL   Immature Granulocytes 0 Not Estab. %   Immature Grans (Abs) 0.0 0.0 - 0.1 x10E3/uL  C-reactive protein     Status: Abnormal   Collection Time: 10/21/19  3:17 PM  Result Value Ref Range   CRP 50 (H) 0 - 10 mg/L  Hemoglobin A1c     Status: Abnormal   Collection Time: 10/21/19  3:17 PM  Result Value Ref Range   Hgb A1c MFr Bld 6.1 (H) 4.8 - 5.6 %    Comment:          Prediabetes: 5.7 - 6.4          Diabetes: >6.4          Glycemic control for adults with diabetes: <7.0    Est. average glucose Bld gHb Est-mCnc 128 mg/dL  Hepatitis C antibody     Status: None   Collection Time: 10/21/19  3:17 PM  Result Value Ref Range   Hep C Virus Ab <0.1 0.0 - 0.9 s/co ratio    Comment:                                   Negative:     < 0.8                              Indeterminate: 0.8 - 0.9                                   Positive:     > 0.9  The CDC recommends that a positive HCV antibody result  be followed up with a HCV Nucleic Acid  Amplification  test (939030).   PSA     Status: Abnormal   Collection Time: 10/21/19  3:17 PM  Result Value Ref Range   Prostate Specific Ag, Serum 8.2 (H) 0.0 - 4.0 ng/mL    Comment: Roche ECLIA methodology. According to the American Urological Association, Serum PSA should decrease and remain at undetectable levels after radical prostatectomy. The AUA defines biochemical recurrence as an initial PSA value 0.2 ng/mL or greater followed by a subsequent confirmatory PSA value 0.2 ng/mL or greater. Values obtained with different assay methods or kits cannot be used interchangeably. Results cannot be interpreted as absolute evidence of the presence or absence of malignant disease.   TSH     Status: None   Collection Time: 10/21/19  3:17 PM  Result Value Ref Range   TSH 2.930 0.450 - 4.500 uIU/mL  Sedimentation rate  Status: Abnormal   Collection Time: 10/21/19  3:17 PM  Result Value Ref Range   Sed Rate 48 (H) 0 - 30 mm/hr  CBC with Differential     Status: None   Collection Time: 10/31/19  1:17 PM  Result Value Ref Range   WBC 9.1 4.0 - 10.5 K/uL   RBC 4.93 4.22 - 5.81 MIL/uL   Hemoglobin 14.6 13.0 - 17.0 g/dL   HCT 45.4 39 - 52 %   MCV 92.1 80.0 - 100.0 fL   MCH 29.6 26.0 - 34.0 pg   MCHC 32.2 30.0 - 36.0 g/dL   RDW 13.8 11.5 - 15.5 %   Platelets 372 150 - 400 K/uL   nRBC 0.0 0.0 - 0.2 %   Neutrophils Relative % 78 %   Neutro Abs 7.1 1.7 - 7.7 K/uL   Lymphocytes Relative 14 %   Lymphs Abs 1.2 0.7 - 4.0 K/uL   Monocytes Relative 7 %   Monocytes Absolute 0.6 0 - 1 K/uL   Eosinophils Relative 0 %   Eosinophils Absolute 0.0 0 - 0 K/uL   Basophils Relative 1 %   Basophils Absolute 0.1 0 - 0 K/uL   Immature Granulocytes 0 %   Abs Immature Granulocytes 0.03 0.00 - 0.07 K/uL    Comment: Performed at [facility], [address], [place], [place] [address]  Basic metabolic panel     Status: Abnormal   Collection Time: 10/31/19  1:17 PM  Result Value Ref Range    Sodium 132 (L) 135 - 145 mmol/L   Potassium 4.2 3.5 - 5.1 mmol/L   Chloride 100 98 - 111 mmol/L   CO2 23 22 - 32 mmol/L   Glucose, Bld 155 (H) 70 - 99 mg/dL    Comment: Glucose reference range applies only to samples taken after fasting for at least 8 hours.   BUN 14 8 - 23 mg/dL   Creatinine, Ser 0.58 (L) 0.61 - 1.24 mg/dL   Calcium 8.8 (L) 8.9 - 10.3 mg/dL   GFR calc non Af Amer >60 >60 mL/min   GFR calc Af Amer >60 >60 mL/min   Anion gap 9 5 - 15    Comment: Performed at [facility], [address] [place], [place] [address]  POC SARS Coronavirus 2 Ag     Status: None   Collection Time: 10/31/19  9:31 PM  Result Value Ref Range   SARS Coronavirus 2 Ag NEGATIVE NEGATIVE    Comment: (NOTE) SARS-CoV-2 antigen NOT DETECTED.  Negative results are presumptive.  Negative results do not preclude SARS-CoV-2 infection and should not be used as the sole basis for treatment or other patient management decisions, including infection  control decisions, particularly in the presence of clinical signs and  symptoms consistent with COVID-19, or in those who have been in contact with the virus.  Negative results must be combined with clinical observations, patient history, and epidemiological information. The expected result is Negative. Fact Sheet for Patients: PodPark.tn Fact Sheet for Healthcare Providers: GiftContent.is This test is not yet approved or cleared by the [place] FDA and  has been authorized for detection and/or diagnosis of SARS-CoV-2 by FDA under an Emergency Use Authorization (EUA).  This EUA will remain in effect (meaning this test can be used) for the duration of  the COVID-19 de claration under Section 564(b)(1) of the Act, 21 U.S.C. section 360bbb-3(b)(1), unless the authorization is terminated or revoked sooner.   Lactate dehydrogenase  Status: Abnormal   Collection Time: 11/23/19 12:22  PM  Result Value Ref Range   LDH 422 (H) 98 - 192 U/L    Comment: Performed at [facility], [address] [place], [place] [address]  CEA     Status: Abnormal   Collection Time: 11/23/19 12:22 PM  Result Value Ref Range   CEA 2,113.0 (H) 0.0 - 4.7 ng/mL    Comment: (NOTE) Results confirmed on dilution.                             Nonsmokers          <3.9                             Smokers             <5.6 Roche [employer]ostics Electrochemiluminescence Immunoassay (ECLIA) Values obtained with different assay methods or kits cannot be used interchangeably.  Results cannot be interpreted as absolute evidence of the presence or absence of malignant disease. Performed At: [facility] [place], [place] [id number] Rush Farmer MD HC:[id number]   Comprehensive metabolic panel     Status: Abnormal   Collection Time: 11/23/19 12:22 PM  Result Value Ref Range   Sodium 136 135 - 145 mmol/L   Potassium 4.2 3.5 - 5.1 mmol/L   Chloride 101 98 - 111 mmol/L   CO2 26 22 - 32 mmol/L   Glucose, Bld 141 (H) 70 - 99 mg/dL    Comment: Glucose reference range applies only to samples taken after fasting for at least 8 hours.   BUN 22 8 - 23 mg/dL   Creatinine, Ser 0.59 (L) 0.61 - 1.24 mg/dL   Calcium 8.2 (L) 8.9 - 10.3 mg/dL   Total Protein 6.4 (L) 6.5 - 8.1 g/dL   Albumin 2.8 (L) 3.5 - 5.0 g/dL   AST 33 15 - 41 U/L   ALT 39 0 - 44 U/L   Alkaline Phosphatase 244 (H) 38 - 126 U/L   Total Bilirubin 0.5 0.3 - 1.2 mg/dL   GFR calc non Af Amer >60 >60 mL/min   GFR calc Af Amer >60 >60 mL/min   Anion gap 9 5 - 15    Comment: Performed at [facility], [address] [place], [place] [address]  CBC with Differential/Platelet     Status: Abnormal   Collection Time: 11/23/19 12:22 PM  Result Value Ref Range   WBC 7.1 4.0 - 10.5 K/uL   RBC 3.67 (L) 4.22 - 5.81 MIL/uL   Hemoglobin 11.0 (L) 13.0 - 17.0 g/dL   HCT 33.8 (L) 39 - 52 %   MCV 92.1 80.0 - 100.0 fL    MCH 30.0 26.0 - 34.0 pg   MCHC 32.5 30.0 - 36.0 g/dL   RDW 16.6 (H) 11.5 - 15.5 %   Platelets 336 150 - 400 K/uL   nRBC 0.0 0.0 - 0.2 %   Neutrophils Relative % 75 %   Neutro Abs 5.2 1.7 - 7.7 K/uL   Lymphocytes Relative 13 %   Lymphs Abs 1.0 0.7 - 4.0 K/uL   Monocytes Relative 10 %   Monocytes Absolute 0.7 0 - 1 K/uL   Eosinophils Relative 1 %   Eosinophils Absolute 0.1 0 - 0 K/uL   Basophils Relative 0 %   Basophils Absolute 0.0 0 - 0 K/uL  Immature Granulocytes 1 %   Abs Immature Granulocytes 0.09 (H) 0.00 - 0.07 K/uL    Comment: Performed at [facility], [address] [place], [place] [address]  CBC     Status: Abnormal   Collection Time: 11/29/19  2:47 AM  Result Value Ref Range   WBC 8.1 4.0 - 10.5 K/uL   RBC 4.13 (L) 4.22 - 5.81 MIL/uL   Hemoglobin 12.3 (L) 13.0 - 17.0 g/dL   HCT 38.1 (L) 39 - 52 %   MCV 92.3 80.0 - 100.0 fL   MCH 29.8 26.0 - 34.0 pg   MCHC 32.3 30.0 - 36.0 g/dL   RDW 16.3 (H) 11.5 - 15.5 %   Platelets 434 (H) 150 - 400 K/uL   nRBC 0.0 0.0 - 0.2 %    Comment: Performed at [facility], [address] [place], [place] [address]  Comprehensive metabolic panel     Status: Abnormal   Collection Time: 11/29/19  2:47 AM  Result Value Ref Range   Sodium 134 (L) 135 - 145 mmol/L   Potassium 4.1 3.5 - 5.1 mmol/L   Chloride 97 (L) 98 - 111 mmol/L   CO2 27 22 - 32 mmol/L   Glucose, Bld 108 (H) 70 - 99 mg/dL    Comment: Glucose reference range applies only to samples taken after fasting for at least 8 hours.   BUN 18 8 - 23 mg/dL   Creatinine, Ser 0.62 0.61 - 1.24 mg/dL   Calcium 8.8 (L) 8.9 - 10.3 mg/dL   Total Protein 7.4 6.5 - 8.1 g/dL   Albumin 3.3 (L) 3.5 - 5.0 g/dL   AST 42 (H) 15 - 41 U/L   ALT 35 0 - 44 U/L   Alkaline Phosphatase 266 (H) 38 - 126 U/L   Total Bilirubin 0.7 0.3 - 1.2 mg/dL   GFR calc non Af Amer >60 >60 mL/min   GFR calc Af Amer >60 >60 mL/min   Anion gap 10 5 - 15    Comment: Performed at [facility], [address] [place], [place] [address]  Troponin I (High Sensitivity)     Status: None   Collection Time: 11/29/19  2:47 AM  Result Value Ref Range   Troponin I (High Sensitivity) 7 <18 ng/L    Comment: (NOTE) Elevated high sensitivity troponin I (hsTnI) values and significant  changes across serial measurements may suggest ACS but many other  chronic and acute conditions are known to elevate hsTnI results.  Refer to the "Links" section for chest pain algorithms and additional  guidance. Performed at [facility], [address] [place], [place] [address]   Brain natriuretic peptide     Status: None   Collection Time: 11/29/19  2:47 AM  Result Value Ref Range   B Natriuretic Peptide 68.0 0.0 - 100.0 pg/mL    Comment: Performed at [facility], [address] [place], [place] [address]  Respiratory Panel by RT PCR (Flu A&B, Covid) - Nasopharyngeal Swab     Status: None   Collection Time: 11/29/19  2:59 AM   Specimen: Nasopharyngeal Swab  Result Value Ref Range   SARS Coronavirus 2 by RT PCR NEGATIVE NEGATIVE    Comment: (NOTE) SARS-CoV-2 target nucleic acids are NOT DETECTED. The SARS-CoV-2 RNA is generally detectable in upper respiratoy specimens during the acute phase of infection. The lowest concentration of SARS-CoV-2 viral copies this assay can detect is 131 copies/mL. A negative result does  not preclude SARS-Cov-2 infection and should not be used as the sole basis for treatment or other patient management decisions. A negative result may occur with  improper specimen collection/handling, submission of specimen other than nasopharyngeal swab, presence of viral mutation(s) within the areas targeted by this assay, and inadequate number of viral copies (<131 copies/mL). A negative result must be combined with clinical observations, patient history, and epidemiological information. The expected result is Negative. Fact Sheet for Patients:   PinkCheek.be Fact Sheet for Healthcare Providers:  GravelBags.it This test is not yet ap proved or cleared by the [place] FDA and  has been authorized for detection and/or diagnosis of SARS-CoV-2 by FDA under an Emergency Use Authorization (EUA). This EUA will remain  in effect (meaning this test can be used) for the duration of the COVID-19 declaration under Section 564(b)(1) of the Act, 21 U.S.C. section 360bbb-3(b)(1), unless the authorization is terminated or revoked sooner.    Influenza A by PCR NEGATIVE NEGATIVE   Influenza B by PCR NEGATIVE NEGATIVE    Comment: (NOTE) The Xpert Xpress SARS-CoV-2/FLU/RSV assay is intended as an aid in  the diagnosis of influenza from Nasopharyngeal swab specimens and  should not be used as a sole basis for treatment. Nasal washings and  aspirates are unacceptable for Xpert Xpress SARS-CoV-2/FLU/RSV  testing. Fact Sheet for Patients: PinkCheek.[place] Fact Sheet for Healthcare Providers: GravelBags.it This test is not yet approved or cleared by the [place] FDA and  has been authorized for detection and/or diagnosis of SARS-CoV-2 by  FDA under an Emergency Use Authorization (EUA). This EUA will remain  in effect (meaning this test can be used) for the duration of the  Covid-19 declaration under Section 564(b)(1) of the Act, 21  U.S.C. section 360bbb-3(b)(1), unless the authorization is  terminated or revoked. Performed at [facility], [address] [place], [place] [address]   HIV Antibody (routine testing w rflx)     Status: None   Collection Time: 11/29/19  6:34 AM  Result Value Ref Range   HIV Screen 4th Generation wRfx Non Reactive Non Reactive    Comment: Performed at [facility], [address] [place], [place] [address]  Comprehensive metabolic panel     Status: Abnormal   Collection Time:  11/30/19  6:00 AM  Result Value Ref Range   Sodium 132 (L) 135 - 145 mmol/L   Potassium 4.8 3.5 - 5.1 mmol/L   Chloride 98 98 - 111 mmol/L   CO2 31 22 - 32 mmol/L   Glucose, Bld 162 (H) 70 - 99 mg/dL    Comment: Glucose reference range applies only to samples taken after fasting for at least 8 hours.   BUN 15 8 - 23 mg/dL   Creatinine, Ser 0.39 (L) 0.61 - 1.24 mg/dL   Calcium 8.6 (L) 8.9 - 10.3 mg/dL   Total Protein 6.1 (L) 6.5 - 8.1 g/dL   Albumin 2.6 (L) 3.5 - 5.0 g/dL   AST 26 15 - 41 U/L   ALT 28 0 - 44 U/L   Alkaline Phosphatase 197 (H) 38 - 126 U/L   Total Bilirubin 0.6 0.3 - 1.2 mg/dL   GFR calc non Af Amer >60 >60 mL/min   GFR calc Af Amer >60 >60 mL/min   Anion gap 3 (L) 5 - 15    Comment: Performed at [facility], [address], [place], [place] [address]  CBC     Status: Abnormal   Collection  Time: 11/30/19  6:00 AM  Result Value Ref Range   WBC 9.4 4.0 - 10.5 K/uL   RBC 3.55 (L) 4.22 - 5.81 MIL/uL   Hemoglobin 10.7 (L) 13.0 - 17.0 g/dL   HCT 33.2 (L) 39 - 52 %   MCV 93.5 80.0 - 100.0 fL   MCH 30.1 26.0 - 34.0 pg   MCHC 32.2 30.0 - 36.0 g/dL   RDW 15.8 (H) 11.5 - 15.5 %   Platelets 429 (H) 150 - 400 K/uL   nRBC 0.0 0.0 - 0.2 %    Comment: Performed at [facility], [address] [place], [place] [address]  Glucose, capillary     Status: None   Collection Time: 12/06/19  9:12 AM  Result Value Ref Range   Glucose-Capillary 96 70 - 99 mg/dL    Comment: Glucose reference range applies only to samples taken after fasting for at least 8 hours.  CEA     Status: Abnormal   Collection Time: 12/21/19 10:10 AM  Result Value Ref Range   CEA 2,543.0 (H) 0.0 - 4.7 ng/mL    Comment: (NOTE) Results confirmed on dilution.                             Nonsmokers          <3.9                             Smokers             <5.6 Roche [employer]ostics Electrochemiluminescence Immunoassay (ECLIA) Values obtained with different assay methods or kits cannot  be used interchangeably.  Results cannot be interpreted as absolute evidence of the presence or absence of malignant disease. Performed At: [facility] [place], [place] [id number] Rush Farmer MD VE:[id number]   Comprehensive metabolic panel     Status: Abnormal   Collection Time: 12/21/19 10:10 AM  Result Value Ref Range   Sodium 135 135 - 145 mmol/L   Potassium 3.9 3.5 - 5.1 mmol/L   Chloride 97 (L) 98 - 111 mmol/L   CO2 30 22 - 32 mmol/L   Glucose, Bld 181 (H) 70 - 99 mg/dL    Comment: Glucose reference range applies only to samples taken after fasting for at least 8 hours.   BUN 14 8 - 23 mg/dL   Creatinine, Ser 0.48 (L) 0.61 - 1.24 mg/dL   Calcium 8.6 (L) 8.9 - 10.3 mg/dL   Total Protein 7.3 6.5 - 8.1 g/dL   Albumin 3.0 (L) 3.5 - 5.0 g/dL   AST 29 15 - 41 U/L   ALT 26 0 - 44 U/L   Alkaline Phosphatase 250 (H) 38 - 126 U/L   Total Bilirubin 0.4 0.3 - 1.2 mg/dL   GFR calc non Af Amer >60 >60 mL/min   GFR calc Af Amer >60 >60 mL/min   Anion gap 8 5 - 15    Comment: Performed at [facility], [address] [place], [place] [address]  CBC with Differential     Status: Abnormal   Collection Time: 12/21/19 10:10 AM  Result Value Ref Range   WBC 8.1 4.0 - 10.5 K/uL   RBC 4.25 4.22 - 5.81 MIL/uL   Hemoglobin 12.3 (L) 13.0 - 17.0 g/dL   HCT 38.1 (L) 39 - 52 %   MCV 89.6 80.0 - 100.0 fL  MCH 28.9 26.0 - 34.0 pg   MCHC 32.3 30.0 - 36.0 g/dL   RDW 13.9 11.5 - 15.5 %   Platelets 358 150 - 400 K/uL   nRBC 0.0 0.0 - 0.2 %   Neutrophils Relative % 82 %   Neutro Abs 6.7 1.7 - 7.7 K/uL   Lymphocytes Relative 7 %   Lymphs Abs 0.6 (L) 0.7 - 4.0 K/uL   Monocytes Relative 8 %   Monocytes Absolute 0.7 0 - 1 K/uL   Eosinophils Relative 1 %   Eosinophils Absolute 0.1 0 - 0 K/uL   Basophils Relative 1 %   Basophils Absolute 0.1 0 - 0 K/uL   Immature Granulocytes 1 %   Abs Immature Granulocytes 0.04 0.00 - 0.07 K/uL    Comment: Performed at [facility], [address] [place], [place] [address]  TSH     Status: None   Collection Time: 12/21/19 10:10 AM  Result Value Ref Range   TSH 1.134 0.350 - 4.500 uIU/mL    Comment: Performed by a 3rd Generation assay with a functional sensitivity of <=0.01 uIU/mL. Performed at [facility], [place], [place] [address]     PHQ2/9: Depression screen [facility] 2/9 01/05/2020 12/08/2019 11/25/2019 10/21/2019 03/16/2017  Decreased Interest 0 1 0 0 0  Down, Depressed, Hopeless 0 1 0 1 0  PHQ - 2 Score 0 2 0 1 0  Altered sleeping 1 2 0 0 -  Tired, decreased energy 0 1 0 0 -  Change in appetite 0 0 0 0 -  Feeling bad or failure about yourself  0 1 0 0 -  Trouble concentrating 0 0 0 0 -  Moving slowly or fidgety/restless 0 0 0 0 -  Suicidal thoughts 0 0 0 0 -  PHQ-9 Score 1 6 0 1 -  Difficult doing work/chores Not difficult at all Not difficult at all - Not difficult at all -    phq 9 is negative   Fall Risk: Fall Risk  01/05/2020 12/08/2019 11/25/2019 10/21/2019 10/21/2019  Falls in the past year? 0 0 0 0 0  Number falls in past yr: - 0 0 0 0  Injury with Fall? - 0 0 0 0  Risk for fall due to : Impaired mobility;Impaired balance/gait - - - -     Functional Status Survey: Is the patient deaf or have difficulty hearing?: No Does the patient have difficulty seeing, even when wearing glasses/contacts?: No Does the patient have difficulty concentrating, remembering, or making decisions?: No Does the patient have difficulty walking or climbing stairs?: Yes Does the patient have difficulty dressing or bathing?: Yes Does the patient have difficulty doing errands alone such as visiting a doctor's office or shopping?: Yes    Assessment & Plan  1. Metastatic cancer to spine [facility])  Keep follow up with oncologist   2. 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 Moderate protein-calorie malnutrition ([place])  He has gained weight   4. Anxiety  -  escitalopram (LEXAPRO) 5 MG tablet; Take 1 tablet (5 mg total) by mouth daily. am's  Dispense: 90 tablet; Refill: 0  5. Dysthymia  - escitalopram (LEXAPRO) 5 MG tablet; Take 1 tablet (5 mg total) by mouth daily. am's  Dispense: 90 tablet; Refill: 0  6. Insomnia, unspecified type  - QUEtiapine (SEROQUEL) 25 MG tablet; Take 1 tablet (25 mg total) by mouth at bedtime.  Dispense: 90 tablet; Refill: 1  7. Atherosclerosis of aorta (HCC)  Not on statin,  8. Centrilobular emphysema ([place])  - Fluticasone-Umeclidin-Vilant (TRELEGY ELLIPTA) 100-62.5-25 MCG/INH AEPB; Inhale 1 puff into the lungs daily.  Dispense: 60 each; Refill: 2 - chlorpheniramine-HYDROcodone (TUSSIONEX PENNKINETIC ER) 10-8 MG/5ML SUER; Take 5 mLs by mouth every 12 (twelve) hours as needed.  Dispense: 120 mL; Refill: 0  9. Viral upper respiratory tract infection  Adding tussionex

## 2020-01-05 NOTE — Telephone Encounter (Signed)
Nutrition  Complimentary case of ensure enlive left at registration for patient pick up today.   Khalid Lacko B. Zenia Resides, [place], [place] Registered Dietitian [phone] (pager)

## 2020-01-06 NOTE — Telephone Encounter (Signed)
Cody [name] has been notified.

## 2020-01-09 DIAGNOSIS — R531 Weakness: Secondary | ICD-10-CM | POA: Diagnosis not present

## 2020-01-10 ENCOUNTER — Other Ambulatory Visit: Payer: Self-pay

## 2020-01-10 DIAGNOSIS — C3410 Malignant neoplasm of upper lobe, unspecified bronchus or lung: Secondary | ICD-10-CM

## 2020-01-11 ENCOUNTER — Other Ambulatory Visit: Payer: Self-pay

## 2020-01-11 ENCOUNTER — Encounter: Payer: Self-pay | Admitting: *Deleted

## 2020-01-11 ENCOUNTER — Inpatient Hospital Stay: Payer: Medicare HMO

## 2020-01-11 ENCOUNTER — Inpatient Hospital Stay (HOSPITAL_BASED_OUTPATIENT_CLINIC_OR_DEPARTMENT_OTHER): Payer: Medicare HMO | Admitting: Internal Medicine

## 2020-01-11 ENCOUNTER — Encounter: Payer: Self-pay | Admitting: Internal Medicine

## 2020-01-11 ENCOUNTER — Inpatient Hospital Stay: Payer: Medicare HMO | Admitting: Hospice and Palliative Medicine

## 2020-01-11 DIAGNOSIS — C3411 Malignant neoplasm of upper lobe, right bronchus or lung: Secondary | ICD-10-CM

## 2020-01-11 DIAGNOSIS — Z7189 Other specified counseling: Secondary | ICD-10-CM

## 2020-01-11 DIAGNOSIS — C3410 Malignant neoplasm of upper lobe, unspecified bronchus or lung: Secondary | ICD-10-CM

## 2020-01-11 DIAGNOSIS — Z5112 Encounter for antineoplastic immunotherapy: Secondary | ICD-10-CM | POA: Diagnosis not present

## 2020-01-11 DIAGNOSIS — C7951 Secondary malignant neoplasm of bone: Secondary | ICD-10-CM

## 2020-01-11 LAB — CBC WITH DIFFERENTIAL/PLATELET
Abs Immature Granulocytes: 0.02 10*3/uL (ref 0.00–0.07)
Basophils Absolute: 0.1 10*3/uL (ref 0.0–0.1)
Basophils Relative: 1 %
Eosinophils Absolute: 0.2 10*3/uL (ref 0.0–0.5)
Eosinophils Relative: 4 %
HCT: 41 % (ref 39.0–52.0)
Hemoglobin: 13 g/dL (ref 13.0–17.0)
Immature Granulocytes: 0 %
Lymphocytes Relative: 14 %
Lymphs Abs: 0.8 10*3/uL (ref 0.7–4.0)
MCH: 27.9 pg (ref 26.0–34.0)
MCHC: 31.7 g/dL (ref 30.0–36.0)
MCV: 88 fL (ref 80.0–100.0)
Monocytes Absolute: 0.5 10*3/uL (ref 0.1–1.0)
Monocytes Relative: 9 %
Neutro Abs: 4.2 10*3/uL (ref 1.7–7.7)
Neutrophils Relative %: 72 %
Platelets: 290 10*3/uL (ref 150–400)
RBC: 4.66 MIL/uL (ref 4.22–5.81)
RDW: 13.8 % (ref 11.5–15.5)
WBC: 5.8 10*3/uL (ref 4.0–10.5)
nRBC: 0 % (ref 0.0–0.2)

## 2020-01-11 LAB — COMPREHENSIVE METABOLIC PANEL
ALT: 23 U/L (ref 0–44)
AST: 30 U/L (ref 15–41)
Albumin: 3.1 g/dL — ABNORMAL LOW (ref 3.5–5.0)
Alkaline Phosphatase: 222 U/L — ABNORMAL HIGH (ref 38–126)
Anion gap: 10 (ref 5–15)
BUN: 12 mg/dL (ref 8–23)
CO2: 31 mmol/L (ref 22–32)
Calcium: 8.8 mg/dL — ABNORMAL LOW (ref 8.9–10.3)
Chloride: 95 mmol/L — ABNORMAL LOW (ref 98–111)
Creatinine, Ser: 0.59 mg/dL — ABNORMAL LOW (ref 0.61–1.24)
GFR calc Af Amer: >60 mL/min (ref >60)
GFR calc non Af Amer: >60 mL/min (ref >60)
Glucose, Bld: 174 mg/dL — ABNORMAL HIGH (ref 70–99)
Potassium: 4 mmol/L (ref 3.5–5.1)
Sodium: 136 mmol/L (ref 135–145)
Total Bilirubin: 0.5 mg/dL (ref 0.3–1.2)
Total Protein: 7.2 g/dL (ref 6.5–8.1)

## 2020-01-11 MED ORDER — SODIUM CHLORIDE 0.9 % IV SOLN
Freq: Once | INTRAVENOUS | Status: AC
  Filled 2020-01-11: qty 250

## 2020-01-11 MED ORDER — ZOLEDRONIC ACID 4 MG/5ML IV CONC
3.0000 mg | Freq: Once | INTRAVENOUS | Status: AC
  Administered 2020-01-11: 3 mg via INTRAVENOUS
  Filled 2020-01-11: qty 3.75

## 2020-01-11 MED ORDER — BENZONATATE 100 MG PO CAPS: 100.0000 mg | ORAL_CAPSULE | Freq: Three times a day (TID) | ORAL | 3 refills | Status: DC | PRN

## 2020-01-11 MED ORDER — SODIUM CHLORIDE 0.9 % IV SOLN
200.0000 mg | Freq: Once | INTRAVENOUS | Status: AC
  Administered 2020-01-11: 200 mg via INTRAVENOUS
  Filled 2020-01-11: qty 8

## 2020-01-11 NOTE — Progress Notes (Signed)
[place] CONSULT NOTE  Patient Care Team: Steele Sizer, MD as PCP - General (Family Medicine) Christene Lye, MD (General Surgery) Telford Nab, RN as Oncology Nurse Navigator  CHIEF COMPLAINTS/PURPOSE OF CONSULTATION: Lung cancer  #  Oncology History Overview Note  # April 2021- RUL lung cancer; liver met; spinal cord compression/ vertebral mets [[facility]]; [[facility]]; LIVER Bx- Metastatic adenocarcinoma, consistent with lung primary.- TPS % Interpretation -PD-L1 IHC 10 LOW EXPRESSION POSITIVE   # 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   # MAY 26th-Keytrda.   # NGS/MOLECULAR TESTS:   # PALLIATIVE CARE EVALUATION:  # PAIN MANAGEMENT:    DIAGNOSIS:   STAGE:         ;  GOALS:  CURRENT/MOST RECENT THERAPY :     Cancer of upper lobe of right lung ([place])  11/23/2019 Initial Diagnosis   Cancer of upper lobe of right lung ([place])   12/21/2019 -  Chemotherapy   The patient had dexamethasone (DECADRON) 4 MG tablet, 1 of 1 cycle, Start date: --, End date: -- PALONOSETRON HCL INJECTION 0.25 MG/5ML, 0.25 mg, Intravenous,  Once, 0 of 2 cycles PEMEtrexed (ALIMTA) 800 mg in sodium chloride 0.9 % 100 mL chemo infusion, 500 mg/m2, Intravenous,  Once, 0 of 4 cycles CARBOplatin (PARAPLATIN) in sodium chloride 0.9 % 100 mL chemo infusion, , Intravenous,  Once, 0 of 2 cycles FOSAPREPITANT IV INFUSION 150 MG, 150 mg, Intravenous,  Once, 0 of 2 cycles pembrolizumab (KEYTRUDA) 200 mg in sodium chloride 0.9 % 50 mL chemo infusion, 200 mg, Intravenous, Once, 2 of 6 cycles Administration: 200 mg (12/21/2019)  for chemotherapy treatment.       HISTORY OF PRESENTING ILLNESS:  Cody [name] 64 y.o.  male patient with metastatic non-small cell lung cancer; cord compression status post decompressive surgery s/p radiation on Keytruda is here for follow-up.  In the  interim patient has not had any hospitalizations.  However patient was evaluated recently management clinic after last treatment for shortness of breath chest x-ray negative for any acute process.  Patient finished radiation approximately 2 weeks ago.  Patient states his pain is fairly well controlled; Tylenol as needed.  Patient is currently working with physical therapy outpatient.  Also goes around with a walker at this time.  Denies any worsening shortness of breath or cough.  Review of Systems  Constitutional: Positive for malaise/fatigue and weight loss. Negative for chills, diaphoresis and fever.  HENT: Negative for nosebleeds and sore throat.   Eyes: Negative for double vision.  Respiratory: Positive for shortness of breath. Negative for cough, hemoptysis, sputum production and wheezing.   Cardiovascular: Negative for chest pain, palpitations, orthopnea and leg swelling.  Gastrointestinal: Negative for abdominal pain, blood in stool, constipation, diarrhea, heartburn, melena, nausea and vomiting.  Genitourinary: Negative for dysuria, frequency and urgency.  Musculoskeletal: Positive for back pain and joint pain.  Skin: Negative.  Negative for itching and rash.  Neurological: Negative for dizziness, tingling, focal weakness, weakness and headaches.  Endo/Heme/Allergies: Does not bruise/bleed easily.  Psychiatric/Behavioral: Negative for depression. The patient is not nervous/anxious and does not have insomnia.      MEDICAL HISTORY:  Past Medical History:  Diagnosis Date  . Allergy   . COPD (chronic obstructive pulmonary disease) ([place])   . Prostate enlargement     SURGICAL HISTORY: Past Surgical History:  Procedure Laterality Date  . HERNIA REPAIR Bilateral 1982  .  HERNIA REPAIR Right 1994  . LAMINECTOMY FOR EXCISION / EVACUATION INTRASPINAL LESION  11/07/2019   T1-T 6 at [facility]     SOCIAL HISTORY: Social History   Socioeconomic History  . Marital status: Married     Spouse name: [name]   . Number of children: 4  . Years of education: Not on file  . Highest education level: Some college, no degree  Occupational History  . Occupation: dispatch and delivery   Tobacco Use  . Smoking status: Former Smoker    Packs/day: 2.00    Years: 30.00    Pack years: 60.00    Quit date: 07/29/2011    Years since quitting: 8.4  . Smokeless tobacco: Never Used  . Tobacco comment: pt vapes  Vaping Use  . Vaping Use: Never assessed  Substance and Sexual Activity  . Alcohol use: Yes    Alcohol/week: 5.0 standard drinks    Types: 5 Standard drinks or equivalent per week    Comment: 1-2/day  . Drug use: No  . Sexual activity: Not Currently    Partners: Female  Other Topics Concern  . Not on file  Social History Narrative   Worked in warehouse/ quit smoking in 2015; beer 1/day; in [place]; with wife.    Social Determinants of Health   Financial Resource Strain: Low Risk   . Difficulty of Paying Living Expenses: Not hard at all  Food Insecurity: No Food Insecurity  . Worried About Charity fundraiser in the Last Year: Never true  . Ran Out of Food in the Last Year: Never true  Transportation Needs: No Transportation Needs  . Lack of Transportation (Medical): No  . Lack of Transportation (Non-Medical): No  Physical Activity: Insufficiently Active  . Days of Exercise per Week: 7 days  . Minutes of Exercise per Session: 20 min  Stress: No Stress Concern Present  . Feeling of Stress : Not at all  Social Connections: Socially Integrated  . Frequency of Communication with Friends and Family: More than three times a week  . Frequency of Social Gatherings with Friends and Family: More than three times a week  . Attends Religious Services: More than 4 times per year  . Active Member of Clubs or Organizations: Yes  . Attends Archivist Meetings: More than 4 times per year  . Marital Status: Married  Human resources officer Violence: Not At Risk  . Fear of  Current or Ex-Partner: No  . Emotionally Abused: No  . Physically Abused: No  . Sexually Abused: No    FAMILY HISTORY: Family History  Problem Relation Age of Onset  . Heart disease Father   . CVA Mother   . Lung cancer Sister   . Lung cancer Brother     ALLERGIES:  has No Known Allergies.  MEDICATIONS:  Current Outpatient Medications  Medication Sig Dispense Refill  . acetaminophen (TYLENOL) 500 MG tablet Take 500-1,000 mg by mouth every 6 (six) hours as needed for mild pain or fever.     Marland Kitchen albuterol (VENTOLIN HFA) 108 (90 Base) MCG/ACT inhaler Inhale 2 puffs into the lungs every 4 (four) hours as needed for wheezing or shortness of breath. 18 g 0  . benzonatate (TESSALON) 100 MG capsule Take 1 capsule (100 mg total) by mouth 3 (three) times daily as needed for cough. 30 capsule 3  . Calcium Carb-Cholecalciferol (OYSTER SHELL CALCIUM) 500-400 MG-UNIT TABS Take 500 tablets by mouth.    . chlorpheniramine-HYDROcodone (TUSSIONEX PENNKINETIC ER) 10-8 MG/5ML  SUER Take 5 mLs by mouth every 12 (twelve) hours as needed. 120 mL 0  . escitalopram (LEXAPRO) 5 MG tablet Take 1 tablet (5 mg total) by mouth daily. am's 90 tablet 0  . Fluticasone-Umeclidin-Vilant (TRELEGY ELLIPTA) 100-62.5-25 MCG/INH AEPB Inhale 1 puff into the lungs daily. 60 each 2  . guaiFENesin (MUCINEX) 600 MG 12 hr tablet Take by mouth 2 (two) times daily.    . Multiple Vitamins-Minerals (MULTIVITAMIN GUMMIES MENS PO) Take 1 Dose by mouth daily.     . QUEtiapine (SEROQUEL) 25 MG tablet Take 1 tablet (25 mg total) by mouth at bedtime. 90 tablet 1  . ondansetron (ZOFRAN) 4 MG tablet Take 1 tablet (4 mg total) by mouth every 8 (eight) hours as needed for nausea or vomiting. (Patient not taking: Reported on 12/21/2019) 20 tablet 2  . prochlorperazine (COMPAZINE) 10 MG tablet Take 1 tablet (10 mg total) by mouth every 6 (six) hours as needed for nausea or vomiting. (Patient not taking: Reported on 01/05/2020) 40 tablet 1   No  current facility-administered medications for this visit.   Facility-Administered Medications Ordered in Other Visits  Medication Dose Route Frequency Provider Last Rate Last Admin  . pembrolizumab (KEYTRUDA) 200 mg in sodium chloride 0.9 % 50 mL chemo infusion  200 mg Intravenous Once Charlaine Dalton R, MD      . zolendronic acid (ZOMETA) 3 mg in sodium chloride 0.9 % 100 mL IVPB  3 mg Intravenous Once Charlaine Dalton R, MD          .  PHYSICAL EXAMINATION: ECOG PERFORMANCE STATUS: 3 - Symptomatic, >50% confined to bed  Vitals:   01/11/20 0915  BP: 110/69  Pulse: 85  Resp: 18  Temp: 97.7 F (36.5 C)  SpO2: 100%   Filed Weights   01/11/20 0921  Weight: 118 lb (53.5 kg)    Physical Exam Constitutional:      Comments: Thin built frail appearing male patient.  No acute distress.  HEENT negative.  Accompanied by his daughter  HENT:     Head: Normocephalic and atraumatic.     Mouth/Throat:     Pharynx: No oropharyngeal exudate.  Eyes:     Pupils: Pupils are equal, round, and reactive to light.  Cardiovascular:     Rate and Rhythm: Normal rate and regular rhythm.  Pulmonary:     Effort: No respiratory distress.     Breath sounds: No wheezing.     Comments: Decreased air entry bilaterally. Abdominal:     General: Bowel sounds are normal. There is no distension.     Palpations: Abdomen is soft. There is no mass.     Tenderness: There is no abdominal tenderness. There is no guarding or rebound.  Musculoskeletal:        General: No tenderness. Normal range of motion.     Cervical back: Normal range of motion and neck supple.  Skin:    General: Skin is warm.  Neurological:     Mental Status: He is alert and oriented to person, place, and time.  Psychiatric:        Mood and Affect: Affect normal.      LABORATORY DATA:  I have reviewed the data as listed Lab Results  Component Value Date   WBC 5.8 01/11/2020   HGB 13.0 01/11/2020   HCT 41.0 01/11/2020    MCV 88.0 01/11/2020   PLT 290 01/11/2020   Recent Labs    11/30/19 0600 12/21/19 1010 01/11/20 0841  NA  132* 135 136  K 4.8 3.9 4.0  CL 98 97* 95*  CO2 31 30 31   GLUCOSE 162* 181* 174*  BUN 15 14 12   CREATININE 0.39* 0.48* 0.59*  CALCIUM 8.6* 8.6* 8.8*  GFRNONAA >60 >60 >60  GFRAA >60 >60 >60  PROT 6.1* 7.3 7.2  ALBUMIN 2.6* 3.0* 3.1*  AST 26 29 30   ALT 28 26 23   ALKPHOS 197* 250* 222*  BILITOT 0.6 0.4 0.5    RADIOGRAPHIC STUDIES: I have personally reviewed the radiological images as listed and agreed with the findings in the report. DG Chest 2 View  Result Date: 12/30/2019 CLINICAL DATA:  Cough and congestion.  History of lung cancer. EXAM: CHEST - 2 VIEW COMPARISON:  PET-CT 12/06/2019. Chest CT 11/29/2019. Chest x-ray 11/29/2019. FINDINGS: Mediastinum hilar structures stable. Heart size stable. Stable right apical prominent pleuroparenchymal thickening is again noted. Reference is made to PET-CT report of 12/06/2019 for further discussion right apical mass lesion with adjacent bony destruction. No evidence of acute infiltrate. No prominent pleural effusion. Prior cervical spine fusion. IMPRESSION: No acute abnormality identified. No evidence of acute infiltrate. Stable right apical prominent pleuroparenchymal thickening is again noted. Reference is made to PET-CT report 12/06/2019 for further discussion of right apical mass lesion with adjacent bony destruction. Electronically Signed   By: Marcello Moores  Register   On: 12/30/2019 13:38    ASSESSMENT & PLAN:   Cancer of upper lobe of right lung ([place]) # Stage IV metastatic non-small cell lung cancer favor adeno; mets to bone; liver; NGS pending.  PET scan May 2021-shows multiple liver lesions; lung lesions bone lesions.  Foundation- TMB-H; others NEG. Currently on h Keytruda single agent [given borderline performance status; s/p radiation]   # Proceed with [place] alone. Labs today reviewed;  acceptable for treatment today.  Again  discussed with patient's daughter the reason for single agent Keytruda; not proceeding with chemoimmunotherapy.   #Bone metastases; spinal cord compression status post decompressive surgery and fusion; Zometa-reviewed side effects including infusion reaction; hypocalcemia osteonecrosis of jaw.  The benefits outweigh the risk; calcium today is 8.8 per recommend calcium plus vitamin D.  Proceed with zometa 3 mg today.  #Malnutrition/cachexia-continue weight loss/underlying malignancy.  S/p nutrition evaluation-stable.  # COPD-stable Trelegy.  # Pain-underlying malignancy; stable as needed tramadol/Tylenol.  #Insomnia-continue Seroquel.STABLE.   #Debility-spinal cord compression/status post surgery-on physical therapy stable.  # DISPOSITION: # proceed with [place] today; add  Zometa today;  # follow up in 3 weeks- MD;labs- cbc/cmp; carbo-alimta- Keytruda;  Dr.B   All questions were answered. The patient knows to call the clinic with any problems, questions or concerns.    Cammie Sickle, MD 01/11/2020 10:19 AM

## 2020-01-11 NOTE — Assessment & Plan Note (Addendum)
#  Stage IV metastatic non-small cell lung cancer favor adeno; mets to bone; liver; NGS pending.  PET scan May 2021-shows multiple liver lesions; lung lesions bone lesions.  Foundation- TMB-H; others NEG. reviewed with the daughter.  Currently on h Keytruda single agent [given borderline performance status; s/p radiation]   # Proceed with [place] alone. Labs today reviewed;  acceptable for treatment today.  Again discussed with patient's daughter the reason for single agent Keytruda; not proceeding with chemoimmunotherapy.   #Bone metastases; spinal cord compression status post decompressive surgery and fusion; Zometa-reviewed side effects including infusion reaction; hypocalcemia osteonecrosis of jaw.  The benefits outweigh the risk; calcium today is 8.8 per recommend calcium plus vitamin D.  Proceed with zometa 3 mg today.  #Malnutrition/cachexia-continue weight loss/underlying malignancy.  S/p nutrition evaluation-stable.  # COPD-stable Trelegy.  # Pain-underlying malignancy; stable as needed tramadol/Tylenol.  #Insomnia-continue Seroquel.STABLE.   #Debility-spinal cord compression/status post surgery-on physical therapy stable.  # DISPOSITION: # proceed with [place] today; add  Zometa today;  # follow up in 3 weeks- MD;labs- cbc/cmp; carbo-alimta- Keytruda;  Dr.B

## 2020-01-11 NOTE — Progress Notes (Signed)
  Oncology Nurse Navigator Documentation  Navigator Location: [facility]-Med Onc (01/11/20 1200)   )Navigator Encounter Type: Follow-up Appt (01/11/20 1200)                     Patient Visit Type: MedOnc (01/11/20 1200) Treatment Phase: Treatment (01/11/20 1200) Barriers/Navigation Needs: No Barriers At This Time (01/11/20 1200)   Interventions: None Required (01/11/20 1200)             met with patient during follow up visit with Dr. Rogue Bussing. All questions answered during visit. No needs or barriers at this time. Instructed pt to call with any further questions or needs. Pt verbalized understanding.          Time Spent with Patient: 30 (01/11/20 1200)

## 2020-01-14 DIAGNOSIS — C7949 Secondary malignant neoplasm of other parts of nervous system: Secondary | ICD-10-CM | POA: Diagnosis not present

## 2020-01-16 DIAGNOSIS — R531 Weakness: Secondary | ICD-10-CM | POA: Diagnosis not present

## 2020-01-17 ENCOUNTER — Ambulatory Visit: Payer: Medicare HMO | Admitting: Family Medicine

## 2020-01-19 DIAGNOSIS — R531 Weakness: Secondary | ICD-10-CM | POA: Diagnosis not present

## 2020-01-23 ENCOUNTER — Inpatient Hospital Stay: Payer: Medicare HMO

## 2020-01-23 DIAGNOSIS — R531 Weakness: Secondary | ICD-10-CM | POA: Diagnosis not present

## 2020-01-23 NOTE — Progress Notes (Signed)
Nutrition Follow-up:  Patient with lung cancer, liver mets, spinal cord compression/vertebral mets. S/p T1-T6 laminectomy with posterior fusion at [facility] on 4/12.  Patient has completed radiation to thoracic spine.  Patient receiving carboplatin, alimta, keytruda.  Spoke with patient via phone for nutrition follow-up.  Patient reports appetite is good.  Has been drinking 2 ensure per day.  Eating waffles for breakfast, roast beef sandwich with chips for lunch and sometimes dinner.  Dinner maybe meat and couple of sides.  Has been trying to snack between meals as well.  Has been working with PT as well and getting stronger.    Medications: reviewed  Labs: reviewed  Anthropometrics:   Weight 118 lb on 6/16 increased from 115 lb on 6/3   NUTRITION DIAGNOSIS: Inadequate oral intake improving   INTERVENTION:  Continue ensure at least BID.  Another complimentary case of ensure enlive will be left at registration desk for patient to pick up.  Message sent to provider regarding loose stool/diarrhea as patient not taking anything for diarrhea Patient has contact information    MONITORING, EVALUATION, GOAL: weight trends, intake   NEXT VISIT: July 26 phone f/u   Tonya Wantz B. Zenia Resides, [place], [place] Registered Dietitian [phone] (pager)

## 2020-01-24 ENCOUNTER — Telehealth: Payer: Self-pay | Admitting: *Deleted

## 2020-01-24 NOTE — Telephone Encounter (Signed)
Patient returned my phone call. I personally spoke with patient. He reported 3 loose stools yesterday. No longer reports any loose stools at this time. He will use OTC imodium if diarrhea reoccurs.  Encouraged hydration.  Reviewed with patient that [name] could contribute to his diarrhea. I offered an apt with [facility]; however, pt will continue to monitor his symptoms and use imodium as needed. Patient understands to call our office back if his diarrhea persists and does not resolve on imodium.

## 2020-01-24 NOTE — Telephone Encounter (Signed)
-----   Message from Jennet Maduro, [place] sent at 01/23/2020  2:49 PM EDT ----- Dr. Jacinto Reap,  I just spoke with Mr Johndrow and he is continuing to have loose, diarrhea stools typically 1-2 times per day.  Sometimes he has loose stool as soon as he is done eating other times will happen couple hours after eating. He has tried to identify foods that may trigger diarrhea but has not determined any particular food.   He is not taking anything for this. Can you make a recommendation for what he can take to help diarrhea/loose stool? Can nursing please follow-up with patient?   Thanks [employer]

## 2020-01-24 NOTE — Telephone Encounter (Signed)
msg sent to team yesterday and received this morning from [facility] in dietary. Attempted to reach out to the patient to discuss his diarrhea concerns. Left detailed voice mail for patient.

## 2020-01-30 DIAGNOSIS — J9601 Acute respiratory failure with hypoxia: Secondary | ICD-10-CM | POA: Diagnosis not present

## 2020-01-30 DIAGNOSIS — J441 Chronic obstructive pulmonary disease with (acute) exacerbation: Secondary | ICD-10-CM | POA: Diagnosis not present

## 2020-02-01 ENCOUNTER — Encounter: Payer: Self-pay | Admitting: Internal Medicine

## 2020-02-01 ENCOUNTER — Inpatient Hospital Stay (HOSPITAL_BASED_OUTPATIENT_CLINIC_OR_DEPARTMENT_OTHER): Payer: Medicare HMO | Admitting: Hospice and Palliative Medicine

## 2020-02-01 ENCOUNTER — Inpatient Hospital Stay: Payer: Medicare HMO

## 2020-02-01 ENCOUNTER — Other Ambulatory Visit: Payer: Self-pay

## 2020-02-01 ENCOUNTER — Encounter: Payer: Self-pay | Admitting: *Deleted

## 2020-02-01 ENCOUNTER — Inpatient Hospital Stay: Payer: Medicare HMO | Attending: Internal Medicine

## 2020-02-01 ENCOUNTER — Inpatient Hospital Stay (HOSPITAL_BASED_OUTPATIENT_CLINIC_OR_DEPARTMENT_OTHER): Payer: Medicare HMO | Admitting: Internal Medicine

## 2020-02-01 VITALS — BP 123/74 | HR 80 | Resp 16

## 2020-02-01 DIAGNOSIS — R64 Cachexia: Secondary | ICD-10-CM | POA: Diagnosis not present

## 2020-02-01 DIAGNOSIS — Z923 Personal history of irradiation: Secondary | ICD-10-CM | POA: Diagnosis not present

## 2020-02-01 DIAGNOSIS — C3411 Malignant neoplasm of upper lobe, right bronchus or lung: Secondary | ICD-10-CM | POA: Insufficient documentation

## 2020-02-01 DIAGNOSIS — R0602 Shortness of breath: Secondary | ICD-10-CM | POA: Diagnosis not present

## 2020-02-01 DIAGNOSIS — T451X5A Adverse effect of antineoplastic and immunosuppressive drugs, initial encounter: Secondary | ICD-10-CM | POA: Diagnosis not present

## 2020-02-01 DIAGNOSIS — I700 Atherosclerosis of aorta: Secondary | ICD-10-CM | POA: Insufficient documentation

## 2020-02-01 DIAGNOSIS — Z8249 Family history of ischemic heart disease and other diseases of the circulatory system: Secondary | ICD-10-CM | POA: Insufficient documentation

## 2020-02-01 DIAGNOSIS — Z7189 Other specified counseling: Secondary | ICD-10-CM | POA: Diagnosis not present

## 2020-02-01 DIAGNOSIS — N400 Enlarged prostate without lower urinary tract symptoms: Secondary | ICD-10-CM | POA: Diagnosis not present

## 2020-02-01 DIAGNOSIS — E46 Unspecified protein-calorie malnutrition: Secondary | ICD-10-CM | POA: Insufficient documentation

## 2020-02-01 DIAGNOSIS — Z515 Encounter for palliative care: Secondary | ICD-10-CM

## 2020-02-01 DIAGNOSIS — Z87891 Personal history of nicotine dependence: Secondary | ICD-10-CM | POA: Diagnosis not present

## 2020-02-01 DIAGNOSIS — C7951 Secondary malignant neoplasm of bone: Secondary | ICD-10-CM | POA: Insufficient documentation

## 2020-02-01 DIAGNOSIS — Z5112 Encounter for antineoplastic immunotherapy: Secondary | ICD-10-CM | POA: Diagnosis present

## 2020-02-01 DIAGNOSIS — R635 Abnormal weight gain: Secondary | ICD-10-CM | POA: Insufficient documentation

## 2020-02-01 DIAGNOSIS — J449 Chronic obstructive pulmonary disease, unspecified: Secondary | ICD-10-CM | POA: Insufficient documentation

## 2020-02-01 DIAGNOSIS — M2550 Pain in unspecified joint: Secondary | ICD-10-CM | POA: Insufficient documentation

## 2020-02-01 DIAGNOSIS — R5381 Other malaise: Secondary | ICD-10-CM | POA: Insufficient documentation

## 2020-02-01 DIAGNOSIS — Z5111 Encounter for antineoplastic chemotherapy: Secondary | ICD-10-CM | POA: Insufficient documentation

## 2020-02-01 DIAGNOSIS — R5383 Other fatigue: Secondary | ICD-10-CM | POA: Insufficient documentation

## 2020-02-01 DIAGNOSIS — Z7289 Other problems related to lifestyle: Secondary | ICD-10-CM | POA: Insufficient documentation

## 2020-02-01 DIAGNOSIS — Z79899 Other long term (current) drug therapy: Secondary | ICD-10-CM | POA: Diagnosis not present

## 2020-02-01 DIAGNOSIS — M549 Dorsalgia, unspecified: Secondary | ICD-10-CM | POA: Diagnosis not present

## 2020-02-01 DIAGNOSIS — G4700 Insomnia, unspecified: Secondary | ICD-10-CM | POA: Diagnosis not present

## 2020-02-01 DIAGNOSIS — I2510 Atherosclerotic heart disease of native coronary artery without angina pectoris: Secondary | ICD-10-CM | POA: Diagnosis not present

## 2020-02-01 DIAGNOSIS — R197 Diarrhea, unspecified: Secondary | ICD-10-CM | POA: Insufficient documentation

## 2020-02-01 DIAGNOSIS — C3410 Malignant neoplasm of upper lobe, unspecified bronchus or lung: Secondary | ICD-10-CM

## 2020-02-01 DIAGNOSIS — D701 Agranulocytosis secondary to cancer chemotherapy: Secondary | ICD-10-CM | POA: Diagnosis not present

## 2020-02-01 DIAGNOSIS — Z801 Family history of malignant neoplasm of trachea, bronchus and lung: Secondary | ICD-10-CM | POA: Insufficient documentation

## 2020-02-01 LAB — COMPREHENSIVE METABOLIC PANEL WITH GFR
ALT: 26 U/L (ref 0–44)
AST: 32 U/L (ref 15–41)
Albumin: 3.4 g/dL — ABNORMAL LOW (ref 3.5–5.0)
Alkaline Phosphatase: 159 U/L — ABNORMAL HIGH (ref 38–126)
Anion gap: 10 (ref 5–15)
BUN: 10 mg/dL (ref 8–23)
CO2: 30 mmol/L (ref 22–32)
Calcium: 9.2 mg/dL (ref 8.9–10.3)
Chloride: 97 mmol/L — ABNORMAL LOW (ref 98–111)
Creatinine, Ser: 0.54 mg/dL — ABNORMAL LOW (ref 0.61–1.24)
GFR calc Af Amer: >60 mL/min
GFR calc non Af Amer: >60 mL/min
Glucose, Bld: 141 mg/dL — ABNORMAL HIGH (ref 70–99)
Potassium: 4 mmol/L (ref 3.5–5.1)
Sodium: 137 mmol/L (ref 135–145)
Total Bilirubin: 0.3 mg/dL (ref 0.3–1.2)
Total Protein: 7.4 g/dL (ref 6.5–8.1)

## 2020-02-01 LAB — CBC WITH DIFFERENTIAL/PLATELET
Abs Immature Granulocytes: 0.02 10*3/uL (ref 0.00–0.07)
Basophils Absolute: 0.1 10*3/uL (ref 0.0–0.1)
Basophils Relative: 1 %
Eosinophils Absolute: 0.4 10*3/uL (ref 0.0–0.5)
Eosinophils Relative: 7 %
HCT: 42 % (ref 39.0–52.0)
Hemoglobin: 13.8 g/dL (ref 13.0–17.0)
Immature Granulocytes: 0 %
Lymphocytes Relative: 19 %
Lymphs Abs: 1 10*3/uL (ref 0.7–4.0)
MCH: 28.2 pg (ref 26.0–34.0)
MCHC: 32.9 g/dL (ref 30.0–36.0)
MCV: 85.9 fL (ref 80.0–100.0)
Monocytes Absolute: 0.7 10*3/uL (ref 0.1–1.0)
Monocytes Relative: 13 %
Neutro Abs: 3.1 10*3/uL (ref 1.7–7.7)
Neutrophils Relative %: 60 %
Platelets: 241 10*3/uL (ref 150–400)
RBC: 4.89 MIL/uL (ref 4.22–5.81)
RDW: 14.6 % (ref 11.5–15.5)
WBC: 5.2 10*3/uL (ref 4.0–10.5)
nRBC: 0 % (ref 0.0–0.2)

## 2020-02-01 MED ORDER — SODIUM CHLORIDE 0.9 % IV SOLN
10.0000 mg | Freq: Once | INTRAVENOUS | Status: AC
  Administered 2020-02-01: 10 mg via INTRAVENOUS
  Filled 2020-02-01: qty 10

## 2020-02-01 MED ORDER — SODIUM CHLORIDE 0.9 % IV SOLN
328.8000 mg | Freq: Once | INTRAVENOUS | Status: AC
  Administered 2020-02-01: 330 mg via INTRAVENOUS
  Filled 2020-02-01: qty 33

## 2020-02-01 MED ORDER — SODIUM CHLORIDE 0.9 % IV SOLN
150.0000 mg | Freq: Once | INTRAVENOUS | Status: AC
  Administered 2020-02-01: 150 mg via INTRAVENOUS
  Filled 2020-02-01: qty 150

## 2020-02-01 MED ORDER — SODIUM CHLORIDE 0.9 % IV SOLN
500.0000 mg/m2 | Freq: Once | INTRAVENOUS | Status: AC
  Administered 2020-02-01: 800 mg via INTRAVENOUS
  Filled 2020-02-01: qty 20

## 2020-02-01 MED ORDER — CYANOCOBALAMIN 1000 MCG/ML IJ SOLN
1000.0000 ug | Freq: Once | INTRAMUSCULAR | Status: AC
  Administered 2020-02-01: 1000 ug via INTRAMUSCULAR
  Filled 2020-02-01: qty 1

## 2020-02-01 MED ORDER — FOLIC ACID 1 MG PO TABS
1.0000 mg | ORAL_TABLET | Freq: Every day | ORAL | 1 refills | Status: DC
Start: 2020-02-01 — End: 2020-05-16

## 2020-02-01 MED ORDER — PALONOSETRON HCL INJECTION 0.25 MG/5ML
0.2500 mg | Freq: Once | INTRAVENOUS | Status: AC
  Administered 2020-02-01: 0.25 mg via INTRAVENOUS
  Filled 2020-02-01: qty 5

## 2020-02-01 MED ORDER — SODIUM CHLORIDE 0.9 % IV SOLN
Freq: Once | INTRAVENOUS | Status: AC
  Filled 2020-02-01: qty 250

## 2020-02-01 MED ORDER — SODIUM CHLORIDE 0.9 % IV SOLN
200.0000 mg | Freq: Once | INTRAVENOUS | Status: AC
  Administered 2020-02-01: 200 mg via INTRAVENOUS
  Filled 2020-02-01: qty 8

## 2020-02-01 NOTE — Progress Notes (Signed)
  Oncology Nurse Navigator Documentation  Navigator Location: [facility]-Med Onc (02/01/20 1000)   )Navigator Encounter Type: Treatment (02/01/20 1000)                     Patient Visit Type: MedOnc (02/01/20 1000) Treatment Phase: Treatment (02/01/20 1000) Barriers/Navigation Needs: No Barriers At This Time (02/01/20 1000)   Interventions: None Required (02/01/20 1000)           met with patient during infusion today to assess for any barriers or needs. No barriers identified at this time. All questions answered during visit. Pt is apprehensive about starting chemo with immunotherapy today. Reassurance provided and instructed to call if has any intolerable side effects. Pt verbalized understanding. Nothing further needed at this time.            Time Spent with Patient: 30 (02/01/20 1000)

## 2020-02-01 NOTE — Progress Notes (Signed)
[place]  Telephone:([phone] [phone]   Name: Cody [name] Date: 02/01/2020 MRN: [id number]  DOB: Nov 03, 1955  Patient Care Team: Steele Sizer, MD as PCP - General (Family Medicine) Christene Lye, MD (General Surgery) Telford Nab, RN as Oncology Nurse Navigator    REASON FOR CONSULTATION: Cody [name] is a 64 y.o. 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  Patient was hospitalized from 11/29/2019 to 11/30/2019 with hypoxic respiratory failure.  He was discharged home on O2.  Patient is pending initiation of RT.  He was referred to palliative care to help address goals and manage ongoing symptoms.  SOCIAL HISTORY:     reports that he quit smoking about 8 years ago. He has a 60.00 pack-year smoking history. He has never used smokeless tobacco. He reports current alcohol use of about 5.0 standard drinks of alcohol per week. He reports that he does not use drugs.   Patient is married and lives at home with his wife.  He has 3 daughters who live in [place] and a son in [place].  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  ADVANCE DIRECTIVES:  Does not have  CODE STATUS:   PAST MEDICAL HISTORY: Past Medical History:  Diagnosis Date  . Allergy   . COPD (chronic obstructive pulmonary disease) ([place])   . Prostate enlargement     PAST SURGICAL HISTORY:  Past Surgical History:  Procedure Laterality Date  . HERNIA REPAIR Bilateral 1982  . HERNIA REPAIR Right 1994  . LAMINECTOMY FOR EXCISION / EVACUATION INTRASPINAL LESION  11/07/2019   T1-T 6 at [facility]     HEMATOLOGY/ONCOLOGY HISTORY:  Oncology History Overview Note  #  April 2021- RUL lung cancer; liver met; spinal cord compression/ vertebral mets [[facility]]; [[place]]; LIVER Bx- Metastatic adenocarcinoma, consistent with lung primary.- TPS % Interpretation -PD-L1 IHC 10 LOW EXPRESSION POSITIVE   # 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   # MAY 26th-Keytrda x2 cycles; [borderline PS] July 7th-carbo Alimta Keytruda cycle #1  # NGS/MOLECULAR TESTS:   # PALLIATIVE CARE EVALUATION:  # PAIN MANAGEMENT:    DIAGNOSIS:   STAGE:         ;  GOALS:  CURRENT/MOST RECENT THERAPY :     Cancer of upper lobe of right lung ([place])  11/23/2019 Initial Diagnosis   Cancer of upper lobe of right lung ([place])   12/21/2019 -  Chemotherapy   The patient had dexamethasone (DECADRON) 4 MG tablet, 1 of 1 cycle, Start date: --, End date: -- palonosetron (ALOXI) injection 0.25 mg, 0.25 mg, Intravenous,  Once, 1 of 2 cycles PEMEtrexed (ALIMTA) 800 mg in sodium chloride 0.9 % 100 mL chemo infusion, 500 mg/m2 = 800 mg, Intravenous,  Once, 1 of 4 cycles CARBOplatin (PARAPLATIN) 330 mg in sodium chloride 0.9 % 100 mL chemo infusion, 330 mg (100 % of original dose 328.8 mg), Intravenous,  Once, 1 of 2 cycles Dose modification:   (original dose 328.8 mg, Cycle 3) fosaprepitant (EMEND) 150 mg in sodium chloride 0.9 % 145 mL IVPB, 150 mg, Intravenous,  Once, 1 of 2 cycles pembrolizumab (KEYTRUDA)  200 mg in sodium chloride 0.9 % 50 mL chemo infusion, 200 mg, Intravenous, Once, 3 of 6 cycles Administration: 200 mg (12/21/2019), 200 mg (01/11/2020)  for chemotherapy treatment.      ALLERGIES:  has No Known Allergies.  MEDICATIONS:  Current Outpatient Medications  Medication Sig Dispense Refill  . acetaminophen (TYLENOL) 500 MG tablet Take 500-1,000 mg by mouth every 6 (six) hours as needed for mild pain or fever.     Marland Kitchen albuterol (VENTOLIN HFA) 108 (90 Base) MCG/ACT inhaler Inhale 2  puffs into the lungs every 4 (four) hours as needed for wheezing or shortness of breath. 18 g 0  . benzonatate (TESSALON) 100 MG capsule Take 1 capsule (100 mg total) by mouth 3 (three) times daily as needed for cough. (Patient not taking: Reported on 02/01/2020) 30 capsule 3  . Calcium Carb-Cholecalciferol (OYSTER SHELL CALCIUM) 500-400 MG-UNIT TABS Take 500 tablets by mouth.    . chlorpheniramine-HYDROcodone (TUSSIONEX PENNKINETIC ER) 10-8 MG/5ML SUER Take 5 mLs by mouth every 12 (twelve) hours as needed. 120 mL 0  . escitalopram (LEXAPRO) 5 MG tablet Take 1 tablet (5 mg total) by mouth daily. am's 90 tablet 0  . Fluticasone-Umeclidin-Vilant (TRELEGY ELLIPTA) 100-62.5-25 MCG/INH AEPB Inhale 1 puff into the lungs daily. 60 each 2  . folic acid (FOLVITE) 1 MG tablet Take 1 tablet (1 mg total) by mouth daily. 90 tablet 1  . guaiFENesin (MUCINEX) 600 MG 12 hr tablet Take by mouth 2 (two) times daily.    . Multiple Vitamins-Minerals (MULTIVITAMIN GUMMIES MENS PO) Take 1 Dose by mouth daily.     . ondansetron (ZOFRAN) 4 MG tablet Take 1 tablet (4 mg total) by mouth every 8 (eight) hours as needed for nausea or vomiting. (Patient not taking: Reported on 12/21/2019) 20 tablet 2  . prochlorperazine (COMPAZINE) 10 MG tablet Take 1 tablet (10 mg total) by mouth every 6 (six) hours as needed for nausea or vomiting. (Patient not taking: Reported on 01/05/2020) 40 tablet 1  . QUEtiapine (SEROQUEL) 25 MG tablet Take 1 tablet (25 mg total) by mouth at bedtime. 90 tablet 1   No current facility-administered medications for this visit.    VITAL SIGNS: There were no vitals taken for this visit. There were no vitals filed for this visit.  Estimated body mass index is 19.58 kg/m as calculated from the following:   Height as of an earlier encounter on 02/01/20: _0  (1.702 m).   Weight as of an earlier encounter on 02/01/20: 125 lb (56.7 kg).  LABS: CBC:    Component Value Date/Time   WBC 5.2 02/01/2020 0837   HGB  13.8 02/01/2020 0837   HGB 15.2 10/21/2019 1517   HCT 42.0 02/01/2020 0837   HCT 43.0 10/21/2019 1517   PLT 241 02/01/2020 0837   PLT 325 10/21/2019 1517   MCV 85.9 02/01/2020 0837   MCV 86 10/21/2019 1517   NEUTROABS 3.1 02/01/2020 0837   NEUTROABS 5.9 10/21/2019 1517   LYMPHSABS 1.0 02/01/2020 0837   LYMPHSABS 1.3 10/21/2019 1517   MONOABS 0.7 02/01/2020 0837   EOSABS 0.4 02/01/2020 0837   EOSABS 0.0 10/21/2019 1517   BASOSABS 0.1 02/01/2020 0837   BASOSABS 0.1 10/21/2019 1517   Comprehensive Metabolic Panel:    Component Value Date/Time   NA 137 02/01/2020 0837   NA 135 10/21/2019 1517   K 4.0 02/01/2020 0837   CL 97 (L) 02/01/2020 0837   CO2 30 02/01/2020 0837   BUN 10 02/01/2020 0837  BUN 14 10/21/2019 1517   CREATININE 0.54 (L) 02/01/2020 0837   CREATININE 0.87 03/16/2017 1211   GLUCOSE 141 (H) 02/01/2020 0837   CALCIUM 9.2 02/01/2020 0837   AST 32 02/01/2020 0837   ALT 26 02/01/2020 0837   ALKPHOS 159 (H) 02/01/2020 0837   BILITOT 0.3 02/01/2020 0837   BILITOT 0.5 10/21/2019 1517   PROT 7.4 02/01/2020 0837   PROT 7.0 10/21/2019 1517   ALBUMIN 3.4 (L) 02/01/2020 0837   ALBUMIN 3.6 (L) 10/21/2019 1517    RADIOGRAPHIC STUDIES: No results found.  PERFORMANCE STATUS (ECOG) : 2 - Symptomatic, <50% confined to bed  Review of Systems Unless otherwise noted, a complete review of systems is negative.  Physical Exam General: NAD, frail appearing, in wheelchair Pulmonary: Unlabored, on O2 Extremities: no edema, no joint deformities Skin: no rashes Neurological: Weakness but otherwise nonfocal  IMPRESSION: Routine follow-up visit.  Patient was seen in the infusion area.  Patient last seen by me on 6/4 in [facility] for cough.  He reports that those symptoms resolved with cough suppressants after about a week.  He feels he is doing overall better.  He reports increased oral intake.  This does seem to correspond with increased weight over the past month.  Patient denies  any symptomatic complaints today.  He says he is feeling optimistic regarding treatment.  PLAN: -Continue current scope of treatment -ACP/MOST form previously reviewed -RTC in 3 weeks   Patient expressed understanding and was in agreement with this plan. He also understands that He can call the clinic at any time with any questions, concerns, or complaints.     Time Total: 15 minutes  Visit consisted of counseling and education dealing with the complex and emotionally intense issues of symptom management and palliative care in the setting of serious and potentially life-threatening illness.Greater than 50%  of this time was spent counseling and coordinating care related to the above assessment and plan.  Signed by: Altha Harm, PhD, NP-C

## 2020-02-01 NOTE — Progress Notes (Signed)
[name] CONSULT NOTE  Patient Care Team: Steele Sizer, MD as PCP - General (Family Medicine) Christene Lye, MD (General Surgery) Telford Nab, RN as Oncology Nurse Navigator  CHIEF COMPLAINTS/PURPOSE OF CONSULTATION: Lung cancer  #  Oncology History Overview Note  # April 2021- RUL lung cancer; liver met; spinal cord compression/ vertebral mets [[facility]]; [[place]]; LIVER Bx- Metastatic adenocarcinoma, consistent with lung primary.- TPS % Interpretation -PD-L1 IHC 10 LOW EXPRESSION POSITIVE   # 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   # MAY 26th-Keytrda x2 cycles; [borderline PS] July 7th-carbo Alimta Keytruda cycle #1  # NGS/MOLECULAR TESTS:   # PALLIATIVE CARE EVALUATION:  # PAIN MANAGEMENT:    DIAGNOSIS:   STAGE:         ;  GOALS:  CURRENT/MOST RECENT THERAPY :     Cancer of upper lobe of right lung ([place])  11/23/2019 Initial Diagnosis   Cancer of upper lobe of right lung ([place])   12/21/2019 -  Chemotherapy   The patient had dexamethasone (DECADRON) 4 MG tablet, 1 of 1 cycle, Start date: --, End date: -- palonosetron (ALOXI) injection 0.25 mg, 0.25 mg, Intravenous,  Once, 1 of 2 cycles PEMEtrexed (ALIMTA) 800 mg in sodium chloride 0.9 % 100 mL chemo infusion, 500 mg/m2 = 800 mg, Intravenous,  Once, 1 of 4 cycles CARBOplatin (PARAPLATIN) 330 mg in sodium chloride 0.9 % 100 mL chemo infusion, 330 mg (100 % of original dose 328.8 mg), Intravenous,  Once, 1 of 2 cycles Dose modification:   (original dose 328.8 mg, Cycle 3) fosaprepitant (EMEND) 150 mg in sodium chloride 0.9 % 145 mL IVPB, 150 mg, Intravenous,  Once, 1 of 2 cycles pembrolizumab (KEYTRUDA) 200 mg in sodium chloride 0.9 % 50 mL chemo infusion, 200 mg, Intravenous, Once, 3 of 6 cycles Administration: 200 mg (12/21/2019), 200 mg (01/11/2020)  for chemotherapy treatment.        HISTORY OF PRESENTING ILLNESS:  Cody [name] 64 y.o.  male patient with metastatic non-small cell lung cancer; cord compression status post decompressive surgery s/p radiation on Keytruda is here for follow-up.  Since last visit patient appetite is improving.  No nausea vomiting.  Mild diarrhea late last 1 to 2 days currently resolved.  Did not have to take Imodium.  Is gaining weight.  As per family he is more physically active even though in a wheelchair.  No worsening cough.  No worsening shortness of breath.  Continues to be on 2 L nasal cannula.   Review of Systems  Constitutional: Positive for malaise/fatigue and weight loss. Negative for chills, diaphoresis and fever.  HENT: Negative for nosebleeds and sore throat.   Eyes: Negative for double vision.  Respiratory: Positive for shortness of breath. Negative for cough, hemoptysis, sputum production and wheezing.   Cardiovascular: Negative for chest pain, palpitations, orthopnea and leg swelling.  Gastrointestinal: Negative for abdominal pain, blood in stool, constipation, diarrhea, heartburn, melena, nausea and vomiting.  Genitourinary: Negative for dysuria, frequency and urgency.  Musculoskeletal: Positive for back pain and joint pain.  Skin: Negative.  Negative for itching and rash.  Neurological: Negative for dizziness, tingling, focal weakness, weakness and headaches.  Endo/Heme/Allergies: Does not bruise/bleed easily.  Psychiatric/Behavioral: Negative for depression. The patient is not nervous/anxious and does not have insomnia.      MEDICAL HISTORY:  Past Medical History:  Diagnosis Date  . Allergy   .  COPD (chronic obstructive pulmonary disease) ([place])   . Prostate enlargement     SURGICAL HISTORY: Past Surgical History:  Procedure Laterality Date  . HERNIA REPAIR Bilateral 1982  . HERNIA REPAIR Right 1994  . LAMINECTOMY FOR EXCISION / EVACUATION INTRASPINAL LESION  11/07/2019   T1-T 6 at [facility]     SOCIAL  HISTORY: Social History   Socioeconomic History  . Marital status: Married    Spouse name: [name]   . Number of children: 4  . Years of education: Not on file  . Highest education level: Some college, no degree  Occupational History  . Occupation: dispatch and delivery   Tobacco Use  . Smoking status: Former Smoker    Packs/day: 2.00    Years: 30.00    Pack years: 60.00    Quit date: 07/29/2011    Years since quitting: 8.5  . Smokeless tobacco: Never Used  . Tobacco comment: pt vapes  Vaping Use  . Vaping Use: Never assessed  Substance and Sexual Activity  . Alcohol use: Yes    Alcohol/week: 5.0 standard drinks    Types: 5 Standard drinks or equivalent per week    Comment: 1-2/day  . Drug use: No  . Sexual activity: Not Currently    Partners: Female  Other Topics Concern  . Not on file  Social History Narrative   Worked in warehouse/ quit smoking in 2015; beer 1/day; in [place]; with wife.    Social Determinants of Health   Financial Resource Strain: Low Risk   . Difficulty of Paying Living Expenses: Not hard at all  Food Insecurity: No Food Insecurity  . Worried About Charity fundraiser in the Last Year: Never true  . Ran Out of Food in the Last Year: Never true  Transportation Needs: No Transportation Needs  . Lack of Transportation (Medical): No  . Lack of Transportation (Non-Medical): No  Physical Activity: Insufficiently Active  . Days of Exercise per Week: 7 days  . Minutes of Exercise per Session: 20 min  Stress: No Stress Concern Present  . Feeling of Stress : Not at all  Social Connections: Socially Integrated  . Frequency of Communication with Friends and Family: More than three times a week  . Frequency of Social Gatherings with Friends and Family: More than three times a week  . Attends Religious Services: More than 4 times per year  . Active Member of Clubs or Organizations: Yes  . Attends Archivist Meetings: More than 4 times per year   . Marital Status: Married  Human resources officer Violence: Not At Risk  . Fear of Current or Ex-Partner: No  . Emotionally Abused: No  . Physically Abused: No  . Sexually Abused: No    FAMILY HISTORY: Family History  Problem Relation Age of Onset  . Heart disease Father   . CVA Mother   . Lung cancer Sister   . Lung cancer Brother     ALLERGIES:  has No Known Allergies.  MEDICATIONS:  Current Outpatient Medications  Medication Sig Dispense Refill  . acetaminophen (TYLENOL) 500 MG tablet Take 500-1,000 mg by mouth every 6 (six) hours as needed for mild pain or fever.     Marland Kitchen albuterol (VENTOLIN HFA) 108 (90 Base) MCG/ACT inhaler Inhale 2 puffs into the lungs every 4 (four) hours as needed for wheezing or shortness of breath. 18 g 0  . Calcium Carb-Cholecalciferol (OYSTER SHELL CALCIUM) 500-400 MG-UNIT TABS Take 500 tablets by mouth.    Marland Kitchen  chlorpheniramine-HYDROcodone (TUSSIONEX PENNKINETIC ER) 10-8 MG/5ML SUER Take 5 mLs by mouth every 12 (twelve) hours as needed. 120 mL 0  . escitalopram (LEXAPRO) 5 MG tablet Take 1 tablet (5 mg total) by mouth daily. am's 90 tablet 0  . Fluticasone-Umeclidin-Vilant (TRELEGY ELLIPTA) 100-62.5-25 MCG/INH AEPB Inhale 1 puff into the lungs daily. 60 each 2  . guaiFENesin (MUCINEX) 600 MG 12 hr tablet Take by mouth 2 (two) times daily.    . Multiple Vitamins-Minerals (MULTIVITAMIN GUMMIES MENS PO) Take 1 Dose by mouth daily.     . QUEtiapine (SEROQUEL) 25 MG tablet Take 1 tablet (25 mg total) by mouth at bedtime. 90 tablet 1  . benzonatate (TESSALON) 100 MG capsule Take 1 capsule (100 mg total) by mouth 3 (three) times daily as needed for cough. (Patient not taking: Reported on 02/01/2020) 30 capsule 3  . folic acid (FOLVITE) 1 MG tablet Take 1 tablet (1 mg total) by mouth daily. 90 tablet 1  . ondansetron (ZOFRAN) 4 MG tablet Take 1 tablet (4 mg total) by mouth every 8 (eight) hours as needed for nausea or vomiting. (Patient not taking: Reported on 12/21/2019)  20 tablet 2  . prochlorperazine (COMPAZINE) 10 MG tablet Take 1 tablet (10 mg total) by mouth every 6 (six) hours as needed for nausea or vomiting. (Patient not taking: Reported on 01/05/2020) 40 tablet 1   No current facility-administered medications for this visit.   Facility-Administered Medications Ordered in Other Visits  Medication Dose Route Frequency Provider Last Rate Last Admin  . CARBOplatin (PARAPLATIN) 330 mg in sodium chloride 0.9 % 250 mL chemo infusion  330 mg Intravenous Once Charlaine Dalton R, MD      . pembrolizumab [facility]) 200 mg in sodium chloride 0.9 % 50 mL chemo infusion  200 mg Intravenous Once Cammie Sickle, MD 116 mL/hr at 02/01/20 1203 200 mg at 02/01/20 1203      .  PHYSICAL EXAMINATION: ECOG PERFORMANCE STATUS: 3 - Symptomatic, >50% confined to bed  Vitals:   02/01/20 0911  BP: 123/90  Pulse: 80  Resp: 16  Temp: 98 F (36.7 C)  SpO2: 97%   Filed Weights   02/01/20 0911  Weight: 125 lb (56.7 kg)    Physical Exam Constitutional:      Comments: Thin built frail appearing male patient.  No acute distress.  HEENT negative.  Accompanied by his daughter  HENT:     Head: Normocephalic and atraumatic.     Mouth/Throat:     Pharynx: No oropharyngeal exudate.  Eyes:     Pupils: Pupils are equal, round, and reactive to light.  Cardiovascular:     Rate and Rhythm: Normal rate and regular rhythm.  Pulmonary:     Effort: No respiratory distress.     Breath sounds: No wheezing.     Comments: Decreased air entry bilaterally. Abdominal:     General: Bowel sounds are normal. There is no distension.     Palpations: Abdomen is soft. There is no mass.     Tenderness: There is no abdominal tenderness. There is no guarding or rebound.  Musculoskeletal:        General: No tenderness. Normal range of motion.     Cervical back: Normal range of motion and neck supple.  Skin:    General: Skin is warm.  Neurological:     Mental Status: He is alert  and oriented to person, place, and time.  Psychiatric:        Mood and  Affect: Affect normal.      LABORATORY DATA:  I have reviewed the data as listed Lab Results  Component Value Date   WBC 5.2 02/01/2020   HGB 13.8 02/01/2020   HCT 42.0 02/01/2020   MCV 85.9 02/01/2020   PLT 241 02/01/2020   Recent Labs    12/21/19 1010 01/11/20 0841 02/01/20 0837  NA 135 136 137  K 3.9 4.0 4.0  CL 97* 95* 97*  CO2 30 31 30   GLUCOSE 181* 174* 141*  BUN 14 12 10   CREATININE 0.48* 0.59* 0.54*  CALCIUM 8.6* 8.8* 9.2  GFRNONAA >60 >60 >60  GFRAA >60 >60 >60  PROT 7.3 7.2 7.4  ALBUMIN 3.0* 3.1* 3.4*  AST 29 30 32  ALT 26 23 26   ALKPHOS 250* 222* 159*  BILITOT 0.4 0.5 0.3    RADIOGRAPHIC STUDIES: I have personally reviewed the radiological images as listed and agreed with the findings in the report. No results found.  ASSESSMENT & PLAN:   Cancer of upper lobe of right lung ([place]) # Stage IV metastatic non-small cell lung cancer favor adeno; mets to bone; liver; NGS pending.  PET scan May 2021-shows multiple liver lesions; lung lesions bone lesions.  Foundation- TMB-H; others NEG. reviewed with the daughter.  Currently on Keytruda single agent [given borderline performance status; s/p radiation]   #Proceed with carboplatin-Alimta Keytruda today; Labs today reviewed;  acceptable for treatment today.  Recommend combination chemoimmunotherapy given improvement in his performance status/weight gain overall sense of general wellbeing.  Will order scan at next visit.  Add B12 injection today; folic acid prescription sent to pharmacy.  #Again discussed with the patient and daughter regarding the potential side effects of chemoimmunotherapy include worsening fatigue diarrhea drop in blood counts etc.  So we will monitor him closely.   # Diarrhea- G-1-2; question Keytruda versus others.  Resolved.  Monitor closely.  #Bone metastases- on zometa.  Every 4 weeks.  We will repeat at next  visit  #Malnutrition/cachexia-continue weight loss/underlying malignancy.  S/p nutrition evaluation-Improving.   # COPD-stable Trelegy.  # Pain-underlying malignancy; stable as needed tramadol/Tylenol.  #Insomnia-continue Seroquel.STABLE.   #Debility-spinal cord compression/status post surgery-on physical therapy-STABLE.   # DISPOSITION: # proceed with [place] today;  # iFollow up on July 16th- MD; labs- cbc/bmp;Zometa; Possible IVFs 1 hour # follow up in 3 weeks- MD;labs- cbc/cmp; carbo-alimtaBeryle Fl[place];  Dr.B   All questions were answered. The patient knows to call the clinic with any problems, questions or concerns.    Cammie Sickle, MD 02/01/2020 12:20 PM

## 2020-02-01 NOTE — Assessment & Plan Note (Addendum)
#  Stage IV metastatic non-small cell lung cancer favor adeno; mets to bone; liver; NGS pending.  PET scan May 2021-shows multiple liver lesions; lung lesions bone lesions.  Foundation- TMB-H; others NEG. reviewed with the daughter.  Currently on Keytruda single agent [given borderline performance status; s/p radiation]   #Proceed with carboplatin-Alimta Keytruda today; Labs today reviewed;  acceptable for treatment today.  Recommend combination chemoimmunotherapy given improvement in his performance status/weight gain overall sense of general wellbeing.  Will order scan at next visit.  Add B12 injection today; folic acid prescription sent to pharmacy.  #Again discussed with the patient and daughter regarding the potential side effects of chemoimmunotherapy include worsening fatigue diarrhea drop in blood counts etc.  So we will monitor him closely.   # Diarrhea- G-1-2; question Keytruda versus others.  Resolved.  Monitor closely.  #Bone metastases- on zometa.  Every 4 weeks.  We will repeat at next visit  #Malnutrition/cachexia-continue weight loss/underlying malignancy.  S/p nutrition evaluation-Improving.   # COPD-stable Trelegy.  # Pain-underlying malignancy; stable as needed tramadol/Tylenol.  #Insomnia-continue Seroquel.STABLE.   #Debility-spinal cord compression/status post surgery-on physical therapy-STABLE.   # DISPOSITION: # proceed with [place] today;  # iFollow up on July 16th- MD; labs- cbc/bmp;Zometa; Possible IVFs 1 hour # follow up in 3 weeks- MD;labs- cbc/cmp; carbo-alimta- Keytruda;  Dr.B

## 2020-02-02 ENCOUNTER — Ambulatory Visit
Admission: RE | Admit: 2020-02-02 | Discharge: 2020-02-02 | Disposition: A | Discharge status: Home / Self Care | Payer: Medicare HMO | Source: Ambulatory Visit | Attending: Radiation Oncology | Admitting: Radiation Oncology

## 2020-02-02 ENCOUNTER — Other Ambulatory Visit: Payer: Self-pay

## 2020-02-02 VITALS — BP 142/68 | HR 85 | Temp 95.5°F | Resp 22 | Wt 128.4 lb

## 2020-02-02 DIAGNOSIS — C7951 Secondary malignant neoplasm of bone: Secondary | ICD-10-CM | POA: Diagnosis not present

## 2020-02-02 DIAGNOSIS — C3411 Malignant neoplasm of upper lobe, right bronchus or lung: Secondary | ICD-10-CM | POA: Insufficient documentation

## 2020-02-02 DIAGNOSIS — Z923 Personal history of irradiation: Secondary | ICD-10-CM | POA: Diagnosis not present

## 2020-02-02 NOTE — Progress Notes (Signed)
Radiation Oncology Follow up Note  Name: Cody [name]   Date:   02/02/2020 MRN:  [id number] DOB: 1956-03-23    This 64 y.o. male presents to the clinic today for 1 month follow-up status post palliative radiation therapy to his thoracic spine for stage IV lung cancer.  REFERRING PROVIDER: Steele Sizer, MD  HPI: Patient is a 64 year old male now at 1 month having completed palliative radiation therapy to his thoracic spine for stage IV lung cancer.Marland Kitchen  He originally presented with spinal cord decompression at [facility] also had a right upper lobe lung mass with extensive metastatic metastatic disease to spine and liver biopsy positive for adenocarcinoma.  He is seen today in routine follow-up and is doing well.  He specifically denies pain.  He is having no focal neurologic deficits.  Patient had been on Keytruda is now proceeding with carboplatin Alimta and Keytruda.  COMPLICATIONS OF TREATMENT: none  FOLLOW UP COMPLIANCE: keeps appointments   PHYSICAL EXAM:  BP (!) 142/68 (BP Location: Left Arm, Patient Position: Sitting)   Pulse 85   Temp (!) 95.5 F (35.3 C) (Tympanic)   Resp (!) 22   Wt 128 lb 6.4 oz (58.2 kg)   BMI 20.11 kg/m  No pain is elicited on deep palpation of his spine.  Motor or sensory and DTR levels are equal and symmetric in the upper lower extremities.  Well-developed well-nourished patient in NAD. HEENT reveals PERLA, EOMI, discs not visualized.  Oral cavity is clear. No oral mucosal lesions are identified. Neck is clear without evidence of cervical or supraclavicular adenopathy. Lungs are clear to A&P. Cardiac examination is essentially unremarkable with regular rate and rhythm without murmur rub or thrill. Abdomen is benign with no organomegaly or masses noted. Motor sensory and DTR levels are equal and symmetric in the upper and lower extremities. Cranial nerves II through XII are grossly intact. Proprioception is intact. No peripheral adenopathy or edema is identified. No  motor or sensory levels are noted. Crude visual fields are within normal range.  RADIOLOGY RESULTS: No current films for review  PLAN: Present time patient is doing well excellent palliative benefit to his radiation therapy.  And pleased with his overall progress.  I will turn follow-up care over to medical oncology I be happy to reevaluate the patient in time should further palliative treatment be indicated.  I would like to take this opportunity to thank you for allowing me to participate in the care of your patient.Noreene Filbert, MD

## 2020-02-08 DIAGNOSIS — C7951 Secondary malignant neoplasm of bone: Secondary | ICD-10-CM | POA: Diagnosis not present

## 2020-02-08 DIAGNOSIS — Z981 Arthrodesis status: Secondary | ICD-10-CM | POA: Diagnosis not present

## 2020-02-09 ENCOUNTER — Encounter: Payer: Self-pay | Admitting: Internal Medicine

## 2020-02-09 ENCOUNTER — Other Ambulatory Visit: Payer: Self-pay | Admitting: *Deleted

## 2020-02-09 DIAGNOSIS — R531 Weakness: Secondary | ICD-10-CM | POA: Diagnosis not present

## 2020-02-09 DIAGNOSIS — C3411 Malignant neoplasm of upper lobe, right bronchus or lung: Secondary | ICD-10-CM

## 2020-02-09 DIAGNOSIS — Z7189 Other specified counseling: Secondary | ICD-10-CM

## 2020-02-09 NOTE — Progress Notes (Signed)
Patient called for pre assessment. He denies any concerns or pain at this time.

## 2020-02-10 ENCOUNTER — Inpatient Hospital Stay: Payer: Medicare HMO

## 2020-02-10 ENCOUNTER — Other Ambulatory Visit: Payer: Self-pay

## 2020-02-10 ENCOUNTER — Inpatient Hospital Stay: Payer: Medicare HMO | Admitting: Hospice and Palliative Medicine

## 2020-02-10 ENCOUNTER — Inpatient Hospital Stay (HOSPITAL_BASED_OUTPATIENT_CLINIC_OR_DEPARTMENT_OTHER): Payer: Medicare HMO | Admitting: Internal Medicine

## 2020-02-10 ENCOUNTER — Encounter: Payer: Self-pay | Admitting: Internal Medicine

## 2020-02-10 DIAGNOSIS — C3411 Malignant neoplasm of upper lobe, right bronchus or lung: Secondary | ICD-10-CM | POA: Diagnosis not present

## 2020-02-10 DIAGNOSIS — C7951 Secondary malignant neoplasm of bone: Secondary | ICD-10-CM

## 2020-02-10 DIAGNOSIS — Z7189 Other specified counseling: Secondary | ICD-10-CM

## 2020-02-10 DIAGNOSIS — Z5112 Encounter for antineoplastic immunotherapy: Secondary | ICD-10-CM | POA: Diagnosis not present

## 2020-02-10 LAB — BASIC METABOLIC PANEL
Anion gap: 11 (ref 5–15)
BUN: 16 mg/dL (ref 8–23)
CO2: 28 mmol/L (ref 22–32)
Calcium: 8.8 mg/dL — ABNORMAL LOW (ref 8.9–10.3)
Chloride: 97 mmol/L — ABNORMAL LOW (ref 98–111)
Creatinine, Ser: 0.67 mg/dL (ref 0.61–1.24)
GFR calc Af Amer: >60 mL/min (ref >60)
GFR calc non Af Amer: >60 mL/min (ref >60)
Glucose, Bld: 164 mg/dL — ABNORMAL HIGH (ref 70–99)
Potassium: 4.1 mmol/L (ref 3.5–5.1)
Sodium: 136 mmol/L (ref 135–145)

## 2020-02-10 LAB — CBC WITH DIFFERENTIAL/PLATELET
Abs Immature Granulocytes: 0.01 10*3/uL (ref 0.00–0.07)
Basophils Absolute: 0 10*3/uL (ref 0.0–0.1)
Basophils Relative: 1 %
Eosinophils Absolute: 0.2 10*3/uL (ref 0.0–0.5)
Eosinophils Relative: 7 %
HCT: 40.4 % (ref 39.0–52.0)
Hemoglobin: 13.1 g/dL (ref 13.0–17.0)
Immature Granulocytes: 0 %
Lymphocytes Relative: 34 %
Lymphs Abs: 0.8 10*3/uL (ref 0.7–4.0)
MCH: 27.8 pg (ref 26.0–34.0)
MCHC: 32.4 g/dL (ref 30.0–36.0)
MCV: 85.8 fL (ref 80.0–100.0)
Monocytes Absolute: 0.4 10*3/uL (ref 0.1–1.0)
Monocytes Relative: 20 %
Neutro Abs: 0.9 10*3/uL — ABNORMAL LOW (ref 1.7–7.7)
Neutrophils Relative %: 38 %
Platelets: 156 10*3/uL (ref 150–400)
RBC: 4.71 MIL/uL (ref 4.22–5.81)
RDW: 14.2 % (ref 11.5–15.5)
WBC: 2.2 10*3/uL — ABNORMAL LOW (ref 4.0–10.5)
nRBC: 0 % (ref 0.0–0.2)

## 2020-02-10 MED ORDER — ZOLEDRONIC ACID 4 MG/5ML IV CONC
3.0000 mg | Freq: Once | INTRAVENOUS | Status: AC
  Administered 2020-02-10: 3 mg via INTRAVENOUS
  Filled 2020-02-10: qty 3.75

## 2020-02-10 MED ORDER — SODIUM CHLORIDE 0.9 % IV SOLN
Freq: Once | INTRAVENOUS | Status: AC
  Filled 2020-02-10: qty 250

## 2020-02-10 NOTE — Assessment & Plan Note (Addendum)
#   Stage IV metastatic non-small cell lung cancer favor adeno; mets to bone; liver; NGS pending.  PET scan May 2021-shows multiple liver lesions; lung lesions bone lesions.Currently on Keytruda x2; Keytruda+Carbo [AUC-4]+Alimta.  # proceed with chemo-Immuno in 10 days; #4.  Labs today absolute neutrophil count of 0.9.  Hemoglobin platelets unremarkable.  No signs of infection.   #Neutropenia-ANC 0.9; from chemotherapy.  Monitor for now.  No growth factors needed.  # Diarrhea- G-1-? keytrda- Improved.   #Bone metastases- on zometa 3 mg IVPB.  Every 4 weeks. Ca-8.8; recommend ca+ vit D BID  #Malnutrition/cachexia-continue weight loss/underlying malignancy.  S/p nutrition evaluation-STABLE.   # COPD-STABLE;  Trelegy.  # Pain-underlying malignancy;STABLE; prn qhsTylenol.  #Insomnia-continue Seroquel.STABLE.   #Debility-spinal cord compression/status post surgery-on physical therapy-STABLE.   #IV access-discussed regarding port patient wants to talk to his sister prior.  # DISPOSITION: # proceed with zometa today # CT- chest 7/26  # AS PLANNED -- MD;labs- cbc/cmp; carbo-alimta- Keytruda;  Dr.B

## 2020-02-10 NOTE — Progress Notes (Signed)
[place] CONSULT NOTE  Patient Care Team: Steele Sizer, MD as PCP - General (Family Medicine) Christene Lye, MD (General Surgery) Telford Nab, RN as Oncology Nurse Navigator  CHIEF COMPLAINTS/PURPOSE OF CONSULTATION: Lung cancer  #  Oncology History Overview Note  # April 2021- RUL lung cancer; liver met; spinal cord compression/ vertebral mets [[facility]]; [[place]]; LIVER Bx- Metastatic adenocarcinoma, consistent with lung primary.- TPS % Interpretation -PD-L1 IHC 10 LOW EXPRESSION POSITIVE   # 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   # MAY 26th-Keytrda x2 cycles; [borderline PS] July 7th-carbo Alimta Keytruda cycle #1  # NGS/MOLECULAR TESTS:   # PALLIATIVE CARE EVALUATION:  # PAIN MANAGEMENT:    DIAGNOSIS:   STAGE:         ;  GOALS:  CURRENT/MOST RECENT THERAPY :     Cancer of upper lobe of right lung ([place])  11/23/2019 Initial Diagnosis   Cancer of upper lobe of right lung ([place])   12/21/2019 -  Chemotherapy   The patient had dexamethasone (DECADRON) 4 MG tablet, 1 of 1 cycle, Start date: --, End date: -- palonosetron (ALOXI) injection 0.25 mg, 0.25 mg, Intravenous,  Once, 1 of 2 cycles Administration: 0.25 mg (02/01/2020) PEMEtrexed (ALIMTA) 800 mg in sodium chloride 0.9 % 100 mL chemo infusion, 500 mg/m2 = 800 mg, Intravenous,  Once, 1 of 4 cycles Administration: 800 mg (02/01/2020) CARBOplatin (PARAPLATIN) 330 mg in sodium chloride 0.9 % 250 mL chemo infusion, 330 mg (100 % of original dose 328.8 mg), Intravenous,  Once, 1 of 2 cycles Dose modification:   (original dose 328.8 mg, Cycle 3) Administration: 330 mg (02/01/2020) fosaprepitant (EMEND) 150 mg in sodium chloride 0.9 % 145 mL IVPB, 150 mg, Intravenous,  Once, 1 of 2 cycles Administration: 150 mg (02/01/2020) pembrolizumab (KEYTRUDA) 200 mg in sodium chloride 0.9 % 50 mL chemo  infusion, 200 mg, Intravenous, Once, 3 of 6 cycles Administration: 200 mg (12/21/2019), 200 mg (02/01/2020), 200 mg (01/11/2020)  for chemotherapy treatment.       HISTORY OF PRESENTING ILLNESS:  Cody [name] 64 y.o.  male patient with metastatic non-small cell lung cancer; cord compression status post decompressive surgery s/p radiation on Keytruda_ Carbo-alimta is here for follow-up.  Patient denies any nausea vomiting.  Appetite is good.  Is gaining weight.  1-2 loose stools otherwise not diarrhea.  Physically more active walking with a walker at this time.  Continues to chronic shortness of breath chronic cough.  Not any worse.  Continues to be on O2 nasal cannula.  Review of Systems  Constitutional: Positive for malaise/fatigue and weight loss. Negative for chills, diaphoresis and fever.  HENT: Negative for nosebleeds and sore throat.   Eyes: Negative for double vision.  Respiratory: Positive for shortness of breath. Negative for cough, hemoptysis, sputum production and wheezing.   Cardiovascular: Negative for chest pain, palpitations, orthopnea and leg swelling.  Gastrointestinal: Negative for abdominal pain, blood in stool, constipation, diarrhea, heartburn, melena, nausea and vomiting.  Genitourinary: Negative for dysuria, frequency and urgency.  Musculoskeletal: Positive for back pain and joint pain.  Skin: Negative.  Negative for itching and rash.  Neurological: Negative for dizziness, tingling, focal weakness, weakness and headaches.  Endo/Heme/Allergies: Does not bruise/bleed easily.  Psychiatric/Behavioral: Negative for depression. The patient is not nervous/anxious and does not have insomnia.      MEDICAL HISTORY:  Past Medical History:  Diagnosis Date  .  Allergy   . COPD (chronic obstructive pulmonary disease) ([place])   . Prostate enlargement     SURGICAL HISTORY: Past Surgical History:  Procedure Laterality Date  . HERNIA REPAIR Bilateral 1982  . HERNIA REPAIR  Right 1994  . LAMINECTOMY FOR EXCISION / EVACUATION INTRASPINAL LESION  11/07/2019   T1-T 6 at [facility]     SOCIAL HISTORY: Social History   Socioeconomic History  . Marital status: Married    Spouse name: [name]   . Number of children: 4  . Years of education: Not on file  . Highest education level: Some college, no degree  Occupational History  . Occupation: dispatch and delivery   Tobacco Use  . Smoking status: Former Smoker    Packs/day: 2.00    Years: 30.00    Pack years: 60.00    Quit date: 07/29/2011    Years since quitting: 8.5  . Smokeless tobacco: Never Used  . Tobacco comment: pt vapes  Vaping Use  . Vaping Use: Never assessed  Substance and Sexual Activity  . Alcohol use: Yes    Alcohol/week: 5.0 standard drinks    Types: 5 Standard drinks or equivalent per week    Comment: 1-2/day  . Drug use: No  . Sexual activity: Not Currently    Partners: Female  Other Topics Concern  . Not on file  Social History Narrative   Worked in warehouse/ quit smoking in 2015; beer 1/day; in [place]; with wife.    Social Determinants of Health   Financial Resource Strain: Low Risk   . Difficulty of Paying Living Expenses: Not hard at all  Food Insecurity: No Food Insecurity  . Worried About Charity fundraiser in the Last Year: Never true  . Ran Out of Food in the Last Year: Never true  Transportation Needs: No Transportation Needs  . Lack of Transportation (Medical): No  . Lack of Transportation (Non-Medical): No  Physical Activity: Insufficiently Active  . Days of Exercise per Week: 7 days  . Minutes of Exercise per Session: 20 min  Stress: No Stress Concern Present  . Feeling of Stress : Not at all  Social Connections: Socially Integrated  . Frequency of Communication with Friends and Family: More than three times a week  . Frequency of Social Gatherings with Friends and Family: More than three times a week  . Attends Religious Services: More than 4 times per year  .  Active Member of Clubs or Organizations: Yes  . Attends Archivist Meetings: More than 4 times per year  . Marital Status: Married  Human resources officer Violence: Not At Risk  . Fear of Current or Ex-Partner: No  . Emotionally Abused: No  . Physically Abused: No  . Sexually Abused: No    FAMILY HISTORY: Family History  Problem Relation Age of Onset  . Heart disease Father   . CVA Mother   . Lung cancer Sister   . Lung cancer Brother     ALLERGIES:  has No Known Allergies.  MEDICATIONS:  Current Outpatient Medications  Medication Sig Dispense Refill  . acetaminophen (TYLENOL) 500 MG tablet Take 500-1,000 mg by mouth every 6 (six) hours as needed for mild pain or fever.     Marland Kitchen albuterol (VENTOLIN HFA) 108 (90 Base) MCG/ACT inhaler Inhale 2 puffs into the lungs every 4 (four) hours as needed for wheezing or shortness of breath. 18 g 0  . benzonatate (TESSALON) 100 MG capsule Take 1 capsule (100 mg total) by mouth  3 (three) times daily as needed for cough. 30 capsule 3  . Calcium Carb-Cholecalciferol (OYSTER SHELL CALCIUM) 500-400 MG-UNIT TABS Take 500 tablets by mouth.    . chlorpheniramine-HYDROcodone (TUSSIONEX PENNKINETIC ER) 10-8 MG/5ML SUER Take 5 mLs by mouth every 12 (twelve) hours as needed. 120 mL 0  . escitalopram (LEXAPRO) 5 MG tablet Take 1 tablet (5 mg total) by mouth daily. am's 90 tablet 0  . Fluticasone-Umeclidin-Vilant (TRELEGY ELLIPTA) 100-62.5-25 MCG/INH AEPB Inhale 1 puff into the lungs daily. 60 each 2  . folic acid (FOLVITE) 1 MG tablet Take 1 tablet (1 mg total) by mouth daily. 90 tablet 1  . guaiFENesin (MUCINEX) 600 MG 12 hr tablet Take by mouth 2 (two) times daily.    . Multiple Vitamins-Minerals (MULTIVITAMIN GUMMIES MENS PO) Take 1 Dose by mouth daily.     . ondansetron (ZOFRAN) 4 MG tablet Take 1 tablet (4 mg total) by mouth every 8 (eight) hours as needed for nausea or vomiting. 20 tablet 2  . prochlorperazine (COMPAZINE) 10 MG tablet Take 1 tablet  (10 mg total) by mouth every 6 (six) hours as needed for nausea or vomiting. 40 tablet 1  . QUEtiapine (SEROQUEL) 25 MG tablet Take 1 tablet (25 mg total) by mouth at bedtime. 90 tablet 1   No current facility-administered medications for this visit.      Marland Kitchen  PHYSICAL EXAMINATION: ECOG PERFORMANCE STATUS: 3 - Symptomatic, >50% confined to bed  Vitals:   02/10/20 0841  BP: 122/76  Pulse: 92  Resp: 18  Temp: (!) 96.6 F (35.9 C)  SpO2: 96%   Filed Weights   02/10/20 0841  Weight: 124 lb (56.2 kg)    Physical Exam Constitutional:      Comments: Thin built frail appearing male patient.  No acute distress.  HEENT negative.  Accompanied by his daughter  HENT:     Head: Normocephalic and atraumatic.     Mouth/Throat:     Pharynx: No oropharyngeal exudate.  Eyes:     Pupils: Pupils are equal, round, and reactive to light.  Cardiovascular:     Rate and Rhythm: Normal rate and regular rhythm.  Pulmonary:     Effort: No respiratory distress.     Breath sounds: No wheezing.     Comments: Decreased air entry bilaterally. Abdominal:     General: Bowel sounds are normal. There is no distension.     Palpations: Abdomen is soft. There is no mass.     Tenderness: There is no abdominal tenderness. There is no guarding or rebound.  Musculoskeletal:        General: No tenderness. Normal range of motion.     Cervical back: Normal range of motion and neck supple.  Skin:    General: Skin is warm.  Neurological:     Mental Status: He is alert and oriented to person, place, and time.  Psychiatric:        Mood and Affect: Affect normal.      LABORATORY DATA:  I have reviewed the data as listed Lab Results  Component Value Date   WBC 2.2 (L) 02/10/2020   HGB 13.1 02/10/2020   HCT 40.4 02/10/2020   MCV 85.8 02/10/2020   PLT 156 02/10/2020   Recent Labs    12/21/19 1010 12/21/19 1010 01/11/20 0841 02/01/20 0837 02/10/20 0822  NA 135   < > 136 137 136  K 3.9   < > 4.0 4.0  4.1  CL 97*   < >  95* 97* 97*  CO2 30   < > 31 30 28   GLUCOSE 181*   < > 174* 141* 164*  BUN 14   < > 12 10 16   CREATININE 0.48*   < > 0.59* 0.54* 0.67  CALCIUM 8.6*   < > 8.8* 9.2 8.8*  GFRNONAA >60   < > >60 >60 >60  GFRAA >60   < > >60 >60 >60  PROT 7.3  --  7.2 7.4  --   ALBUMIN 3.0*  --  3.1* 3.4*  --   AST 29  --  30 32  --   ALT 26  --  23 26  --   ALKPHOS 250*  --  222* 159*  --   BILITOT 0.4  --  0.5 0.3  --    < > = values in this interval not displayed.    RADIOGRAPHIC STUDIES: I have personally reviewed the radiological images as listed and agreed with the findings in the report. No results found.  ASSESSMENT & PLAN:   Cancer of upper lobe of right lung ([place]) # Stage IV metastatic non-small cell lung cancer favor adeno; mets to bone; liver; NGS pending.  PET scan May 2021-shows multiple liver lesions; lung lesions bone lesions.Currently on Keytruda x2; Keytruda+Carbo [AUC-4]+Alimta.  # proceed with chemo-Immuno in 10 days; #4.  Labs today absolute neutrophil count of 0.9.  Hemoglobin platelets unremarkable.  No signs of infection.   #Neutropenia-ANC 0.9; from chemotherapy.  Monitor for now.  No growth factors needed.  # Diarrhea- G-1-? keytrda- Improved.   #Bone metastases- on zometa 3 mg IVPB.  Every 4 weeks. Ca-8.8; recommend ca+ vit D BID  #Malnutrition/cachexia-continue weight loss/underlying malignancy.  S/p nutrition evaluation-STABLE.   # COPD-STABLE;  Trelegy.  # Pain-underlying malignancy;STABLE; prn qhsTylenol.  #Insomnia-continue Seroquel.STABLE.   #Debility-spinal cord compression/status post surgery-on physical therapy-STABLE.   #IV access-discussed regarding port patient wants to talk to his sister prior.  # DISPOSITION: # proceed with zometa today # CT- chest 7/26  # AS PLANNED -- MD;labs- cbc/cmp; carbo-alimta- Keytruda;  Dr.B   All questions were answered. The patient knows to call the clinic with any problems, questions or concerns.     Cammie Sickle, MD 02/10/2020 10:19 AM

## 2020-02-13 ENCOUNTER — Inpatient Hospital Stay (HOSPITAL_BASED_OUTPATIENT_CLINIC_OR_DEPARTMENT_OTHER): Payer: Medicare HMO | Admitting: Hospice and Palliative Medicine

## 2020-02-13 DIAGNOSIS — C3411 Malignant neoplasm of upper lobe, right bronchus or lung: Secondary | ICD-10-CM | POA: Diagnosis not present

## 2020-02-13 DIAGNOSIS — Z515 Encounter for palliative care: Secondary | ICD-10-CM | POA: Diagnosis not present

## 2020-02-13 DIAGNOSIS — R531 Weakness: Secondary | ICD-10-CM | POA: Diagnosis not present

## 2020-02-13 DIAGNOSIS — C7949 Secondary malignant neoplasm of other parts of nervous system: Secondary | ICD-10-CM | POA: Diagnosis not present

## 2020-02-13 NOTE — Progress Notes (Signed)
Virtual Visit via Video Note  I connected with [name] on 02/13/20 at 10:30 AM EDT by a video enabled telemedicine application and verified that I am speaking with the correct person using two identifiers.   I discussed the limitations of evaluation and management by telemedicine and the availability of in person appointments. The patient expressed understanding and agreed to proceed.  History of Present Illness: Cody [name] is a 64 y.o. male with multiple medical problems including O2 dependent COPD and stage IV non-small cell lung cancer metastatic to spine status post T1-T4 laminectomy at [facility] on 11/13/2019 secondary to spinal cord compression from a large metastatic mass with residual paraparesis.  Patient was hospitalized from 11/29/2019 to 11/30/2019 with hypoxic respiratory failure.  He was discharged home on O2.  Patient is pending initiation of RT.  He was referred to palliative care to help address goals and manage ongoing symptoms.   Observations/Objective: I spoke with patient via video chat.  Patient reports he is doing reasonably well.  He denies any significant changes or concerns today.  He reports improved appetite and slow weight gain.  He has an appointment later today with nutrition.  He continues to use ensures daily.  No symptomatic complaints today.  Assessment and Plan: Stage IV NSCLC -on treatment with Keytruda/carbo/Alimta.  Seems to be doing well symptomatically.  Patient has follow-up CT next week.  Will follow  Follow Up Instructions: Follow-up MyChart visit in about a month   I discussed the assessment and treatment plan with the patient. The patient was provided an opportunity to ask questions and all were answered. The patient agreed with the plan and demonstrated an understanding of the instructions.   The patient was advised to call back or seek an in-person evaluation if the symptoms worsen or if the condition fails to improve as anticipated.  I  provided 10 minutes of non-face-to-face time during this encounter.   Irean Hong, NP

## 2020-02-16 DIAGNOSIS — R531 Weakness: Secondary | ICD-10-CM | POA: Diagnosis not present

## 2020-02-20 ENCOUNTER — Other Ambulatory Visit: Payer: Self-pay

## 2020-02-20 ENCOUNTER — Ambulatory Visit
Admission: RE | Admit: 2020-02-20 | Discharge: 2020-02-20 | Disposition: A | Discharge status: Home / Self Care | Payer: Medicare HMO | Source: Ambulatory Visit | Attending: Internal Medicine | Admitting: Internal Medicine

## 2020-02-20 ENCOUNTER — Inpatient Hospital Stay: Payer: Medicare HMO

## 2020-02-20 DIAGNOSIS — I700 Atherosclerosis of aorta: Secondary | ICD-10-CM | POA: Diagnosis not present

## 2020-02-20 DIAGNOSIS — C7951 Secondary malignant neoplasm of bone: Secondary | ICD-10-CM | POA: Diagnosis not present

## 2020-02-20 DIAGNOSIS — C3491 Malignant neoplasm of unspecified part of right bronchus or lung: Secondary | ICD-10-CM | POA: Diagnosis not present

## 2020-02-20 DIAGNOSIS — I2510 Atherosclerotic heart disease of native coronary artery without angina pectoris: Secondary | ICD-10-CM | POA: Diagnosis not present

## 2020-02-20 DIAGNOSIS — C3411 Malignant neoplasm of upper lobe, right bronchus or lung: Secondary | ICD-10-CM

## 2020-02-20 MED ORDER — IOHEXOL 300 MG/ML  SOLN
75.0000 mL | Freq: Once | INTRAMUSCULAR | Status: AC | PRN
  Administered 2020-02-20: 75 mL via INTRAVENOUS

## 2020-02-20 NOTE — Progress Notes (Signed)
Nutrition Follow-up:  Patient with lung cancer, liver mets, spinal cord compression/vertebral mets.  S/p T1-T6 laminectomy with posterior fusion at [facility] on 4/12.  Patient receiving carboplatin, alimta, keytruda.    Spoke with patient via phone for nutrition follow-up.  Patient reports appetite is good most day.  Reports that he continues to drink 2 ensure per day.  Reports that he usually eats 2-3 meals per day. Reports spicy foods tend to upset stomach (ie diarrhea).      Medications: reviewed  Labs: reviewed  Anthropometrics:   Weight 124 lb on 7/16 increased from 118 lb on 6/16.    115 lb on 6/3   NUTRITION DIAGNOSIS: Inadequate oral intake improving   INTERVENTION:  Continue oral nutrition supplement.  Will leave another case of ensure at registration desk for patient to pick up on 7/28.  Patient to continue high calorie, high protein foods to promote continued weight gain.  Patient has contact information    MONITORING, EVALUATION, GOAL: weight trends, intake   NEXT VISIT: August 30, phone f/u  Nassir Neidert B. Zenia Resides, [place], [place] Registered Dietitian [phone] (mobile)

## 2020-02-21 ENCOUNTER — Encounter: Payer: Self-pay | Admitting: Internal Medicine

## 2020-02-22 ENCOUNTER — Other Ambulatory Visit: Payer: Self-pay

## 2020-02-22 ENCOUNTER — Inpatient Hospital Stay (HOSPITAL_BASED_OUTPATIENT_CLINIC_OR_DEPARTMENT_OTHER): Payer: Medicare HMO | Admitting: Internal Medicine

## 2020-02-22 ENCOUNTER — Inpatient Hospital Stay: Payer: Medicare HMO

## 2020-02-22 DIAGNOSIS — C3410 Malignant neoplasm of upper lobe, unspecified bronchus or lung: Secondary | ICD-10-CM

## 2020-02-22 DIAGNOSIS — C3411 Malignant neoplasm of upper lobe, right bronchus or lung: Secondary | ICD-10-CM

## 2020-02-22 DIAGNOSIS — Z7189 Other specified counseling: Secondary | ICD-10-CM

## 2020-02-22 DIAGNOSIS — Z5112 Encounter for antineoplastic immunotherapy: Secondary | ICD-10-CM | POA: Diagnosis not present

## 2020-02-22 LAB — CBC WITH DIFFERENTIAL/PLATELET
Abs Immature Granulocytes: 0.01 10*3/uL (ref 0.00–0.07)
Basophils Absolute: 0 10*3/uL (ref 0.0–0.1)
Basophils Relative: 1 %
Eosinophils Absolute: 0.3 10*3/uL (ref 0.0–0.5)
Eosinophils Relative: 9 %
HCT: 41.6 % (ref 39.0–52.0)
Hemoglobin: 13.2 g/dL (ref 13.0–17.0)
Immature Granulocytes: 0 %
Lymphocytes Relative: 21 %
Lymphs Abs: 0.8 10*3/uL (ref 0.7–4.0)
MCH: 28.1 pg (ref 26.0–34.0)
MCHC: 31.7 g/dL (ref 30.0–36.0)
MCV: 88.7 fL (ref 80.0–100.0)
Monocytes Absolute: 0.5 10*3/uL (ref 0.1–1.0)
Monocytes Relative: 15 %
Neutro Abs: 1.9 10*3/uL (ref 1.7–7.7)
Neutrophils Relative %: 54 %
Platelets: 250 10*3/uL (ref 150–400)
RBC: 4.69 MIL/uL (ref 4.22–5.81)
RDW: 15.4 % (ref 11.5–15.5)
WBC: 3.5 10*3/uL — ABNORMAL LOW (ref 4.0–10.5)
nRBC: 0 % (ref 0.0–0.2)

## 2020-02-22 LAB — COMPREHENSIVE METABOLIC PANEL
ALT: 20 U/L (ref 0–44)
AST: 23 U/L (ref 15–41)
Albumin: 3.7 g/dL (ref 3.5–5.0)
Alkaline Phosphatase: 94 U/L (ref 38–126)
Anion gap: 6 (ref 5–15)
BUN: 16 mg/dL (ref 8–23)
CO2: 31 mmol/L (ref 22–32)
Calcium: 8.7 mg/dL — ABNORMAL LOW (ref 8.9–10.3)
Chloride: 100 mmol/L (ref 98–111)
Creatinine, Ser: 0.59 mg/dL — ABNORMAL LOW (ref 0.61–1.24)
GFR calc Af Amer: >60 mL/min (ref >60)
GFR calc non Af Amer: >60 mL/min (ref >60)
Glucose, Bld: 117 mg/dL — ABNORMAL HIGH (ref 70–99)
Potassium: 4 mmol/L (ref 3.5–5.1)
Sodium: 137 mmol/L (ref 135–145)
Total Bilirubin: 0.5 mg/dL (ref 0.3–1.2)
Total Protein: 6.7 g/dL (ref 6.5–8.1)

## 2020-02-22 MED ORDER — SODIUM CHLORIDE 0.9 % IV SOLN
10.0000 mg | Freq: Once | INTRAVENOUS | Status: AC
  Administered 2020-02-22: 10 mg via INTRAVENOUS
  Filled 2020-02-22: qty 10

## 2020-02-22 MED ORDER — SODIUM CHLORIDE 0.9 % IV SOLN
150.0000 mg | Freq: Once | INTRAVENOUS | Status: AC
  Administered 2020-02-22: 150 mg via INTRAVENOUS
  Filled 2020-02-22: qty 150

## 2020-02-22 MED ORDER — PALONOSETRON HCL INJECTION 0.25 MG/5ML
0.2500 mg | Freq: Once | INTRAVENOUS | Status: AC
  Administered 2020-02-22: 0.25 mg via INTRAVENOUS
  Filled 2020-02-22: qty 5

## 2020-02-22 MED ORDER — SODIUM CHLORIDE 0.9 % IV SOLN
328.8000 mg | Freq: Once | INTRAVENOUS | Status: AC
  Administered 2020-02-22: 330 mg via INTRAVENOUS
  Filled 2020-02-22: qty 33

## 2020-02-22 MED ORDER — SODIUM CHLORIDE 0.9 % IV SOLN
Freq: Once | INTRAVENOUS | Status: AC
  Filled 2020-02-22: qty 250

## 2020-02-22 MED ORDER — SODIUM CHLORIDE 0.9 % IV SOLN
500.0000 mg/m2 | Freq: Once | INTRAVENOUS | Status: AC
  Administered 2020-02-22: 800 mg via INTRAVENOUS
  Filled 2020-02-22: qty 20

## 2020-02-22 MED ORDER — SODIUM CHLORIDE 0.9 % IV SOLN
200.0000 mg | Freq: Once | INTRAVENOUS | Status: AC
  Administered 2020-02-22: 200 mg via INTRAVENOUS
  Filled 2020-02-22: qty 8

## 2020-02-22 NOTE — Assessment & Plan Note (Addendum)
#   Stage IV metastatic non-small cell lung cancer favor adeno; mets to bone; liver; NGS pending.  PET scan May 2021-shows multiple liver lesions; lung lesions bone lesions.Currently on Keytruda x2; Keytruda+Carbo [AUC-4]+Alimta. CT scan July 27th-partial response [stable lung lesion; improved para spinal mass; improvement of the liver lesions]  # proceed with carbo-alimta-keytruda #2  Labs today reviewed;  acceptable for treatment today. Discussed patient will need total of 4 cycles of chemoimmunotherapy; followed by maintenance Keytruda.  # Diarrhea- G-1-? keytrda-  STABLE.   #Bone metastases- on zometa 3 mg IVPB.  Every 4 weeks. Ca-8.8; recommend ca+ vit D BID  #Malnutrition/cachexia-continue weight loss/underlying malignancy.  S/p nutrition evaluation-STABLE.   # COPD-STABLE;  Trelegy.  # Pain-underlying malignancy; STABLE; prn qhsTylenol.  # Insomnia-continue Seroquel.STABLE.   #Debility-spinal cord compression/status post surgery-on physical therapy-STABLE.   #I IV access-interested will proceed with port referral.   # DISPOSITION: # refer to IR re: port placement.  # Chemo today- # follow up in 3 weeks-  MD;labs- cbc/cmp; carbo-alimta- Keytruda; Zometa Dr.B

## 2020-02-22 NOTE — Progress Notes (Signed)
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   # MAY 26th-Keytrda x2 cycles; [borderline PS] July 7th-carbo Alimta Keytruda cycle #1  # NGS/MOLECULAR TESTS:   # PALLIATIVE CARE EVALUATION:  # PAIN MANAGEMENT:    DIAGNOSIS:   STAGE:         ;  GOALS:  CURRENT/MOST RECENT THERAPY :     Cancer of upper lobe of right lung ([place])  11/23/2019 Initial Diagnosis   Cancer of upper lobe of right lung ([place])   12/21/2019 -  Chemotherapy   The patient had dexamethasone (DECADRON) 4 MG tablet, 1 of 1 cycle, Start date: --, End date: -- palonosetron (ALOXI) injection 0.25 mg, 0.25 mg, Intravenous,  Once, 2 of 2 cycles Administration: 0.25 mg (02/01/2020) PEMEtrexed (ALIMTA) 800 mg in sodium chloride 0.9 % 100 mL chemo infusion, 500 mg/m2 = 800 mg, Intravenous,  Once, 2 of 4 cycles Administration: 800 mg (02/01/2020) CARBOplatin (PARAPLATIN) 330 mg in sodium chloride 0.9 % 250 mL chemo infusion, 330 mg (100 % of original dose 328.8 mg), Intravenous,  Once, 2 of 2 cycles Dose modification:   (original dose 328.8 mg, Cycle 3) Administration: 330 mg (02/01/2020) fosaprepitant (EMEND) 150 mg in sodium chloride 0.9 % 145 mL IVPB, 150 mg, Intravenous,  Once, 2 of 2 cycles Administration: 150 mg (02/01/2020) pembrolizumab (KEYTRUDA) 200 mg in sodium chloride 0.9 % 50 mL chemo  infusion, 200 mg, Intravenous, Once, 4 of 6 cycles Administration: 200 mg (12/21/2019), 200 mg (02/01/2020), 200 mg (01/11/2020)  for chemotherapy treatment.     HISTORY OF PRESENTING ILLNESS:  Cody [name] 64 y.o.  male patient with metastatic non-small cell lung cancer; cord compression status post decompressive surgery; currently on Keytruda_ Carbo-alimta is here for follow-up/review results of the CT scan.  Patient appetite is improving.  Is gaining weight.  No nausea no vomiting no diarrhea.  Appetite is fair.  Continues have chronic shortness of breath.  Continues on home oxygen.  Ambulates using a walker.  No falls.  Review of Systems  Constitutional: Positive for malaise/fatigue and weight loss. Negative for chills, diaphoresis and fever.  HENT: Negative for nosebleeds and sore throat.   Eyes: Negative for double vision.  Respiratory: Positive for shortness of breath. Negative for cough, hemoptysis, sputum production and wheezing.   Cardiovascular: Negative for chest pain, palpitations, orthopnea and leg swelling.  Gastrointestinal: Negative for abdominal pain, blood in stool, constipation, diarrhea, heartburn, melena, nausea and vomiting.  Genitourinary: Negative for dysuria, frequency and urgency.  Musculoskeletal: Positive for back pain and joint pain.  Skin: Negative.  Negative for itching and rash.  Neurological: Negative for dizziness, tingling, focal weakness, weakness and headaches.  Endo/Heme/Allergies: Does not bruise/bleed easily.  Psychiatric/Behavioral: Negative for depression. The patient is not nervous/anxious and does not have insomnia.      MEDICAL HISTORY:  Past Medical History:  Diagnosis Date  . Allergy   . COPD (chronic obstructive pulmonary disease) ([place])   .  Prostate enlargement     SURGICAL HISTORY: Past Surgical History:  Procedure Laterality Date  . HERNIA REPAIR Bilateral 1982  . HERNIA REPAIR Right 1994  . LAMINECTOMY FOR EXCISION / EVACUATION  INTRASPINAL LESION  11/07/2019   T1-T 6 at [facility]     SOCIAL HISTORY: Social History   Socioeconomic History  . Marital status: Married    Spouse name: [name]   . Number of children: 4  . Years of education: Not on file  . Highest education level: Some college, no degree  Occupational History  . Occupation: dispatch and delivery   Tobacco Use  . Smoking status: Former Smoker    Packs/day: 2.00    Years: 30.00    Pack years: 60.00    Quit date: 07/29/2011    Years since quitting: 8.5  . Smokeless tobacco: Never Used  . Tobacco comment: pt vapes  Vaping Use  . Vaping Use: Never assessed  Substance and Sexual Activity  . Alcohol use: Yes    Alcohol/week: 5.0 standard drinks    Types: 5 Standard drinks or equivalent per week    Comment: 1-2/day  . Drug use: No  . Sexual activity: Not Currently    Partners: Female  Other Topics Concern  . Not on file  Social History Narrative   Worked in warehouse/ quit smoking in 2015; beer 1/day; in [place]; with wife.    Social Determinants of Health   Financial Resource Strain: Low Risk   . Difficulty of Paying Living Expenses: Not hard at all  Food Insecurity: No Food Insecurity  . Worried About Charity fundraiser in the Last Year: Never true  . Ran Out of Food in the Last Year: Never true  Transportation Needs: No Transportation Needs  . Lack of Transportation (Medical): No  . Lack of Transportation (Non-Medical): No  Physical Activity: Insufficiently Active  . Days of Exercise per Week: 7 days  . Minutes of Exercise per Session: 20 min  Stress: No Stress Concern Present  . Feeling of Stress : Not at all  Social Connections: Socially Integrated  . Frequency of Communication with Friends and Family: More than three times a week  . Frequency of Social Gatherings with Friends and Family: More than three times a week  . Attends Religious Services: More than 4 times per year  . Active Member of Clubs or Organizations: Yes  .  Attends Archivist Meetings: More than 4 times per year  . Marital Status: Married  Human resources officer Violence: Not At Risk  . Fear of Current or Ex-Partner: No  . Emotionally Abused: No  . Physically Abused: No  . Sexually Abused: No    FAMILY HISTORY: Family History  Problem Relation Age of Onset  . Heart disease Father   . CVA Mother   . Lung cancer Sister   . Lung cancer Brother     ALLERGIES:  has No Known Allergies.  MEDICATIONS:  Current Outpatient Medications  Medication Sig Dispense Refill  . acetaminophen (TYLENOL) 500 MG tablet Take 500-1,000 mg by mouth every 6 (six) hours as needed for mild pain or fever.     Marland Kitchen albuterol (VENTOLIN HFA) 108 (90 Base) MCG/ACT inhaler Inhale 2 puffs into the lungs every 4 (four) hours as needed for wheezing or shortness of breath. 18 g 0  . benzonatate (TESSALON) 100 MG capsule Take 1 capsule (100 mg total) by mouth 3 (three) times daily as needed for cough. 30 capsule 3  .  Calcium Carb-Cholecalciferol (OYSTER SHELL CALCIUM) 500-400 MG-UNIT TABS Take 500 tablets by mouth.    . chlorpheniramine-HYDROcodone (TUSSIONEX PENNKINETIC ER) 10-8 MG/5ML SUER Take 5 mLs by mouth every 12 (twelve) hours as needed. 120 mL 0  . escitalopram (LEXAPRO) 5 MG tablet Take 1 tablet (5 mg total) by mouth daily. am's 90 tablet 0  . Fluticasone-Umeclidin-Vilant (TRELEGY ELLIPTA) 100-62.5-25 MCG/INH AEPB Inhale 1 puff into the lungs daily. 60 each 2  . folic acid (FOLVITE) 1 MG tablet Take 1 tablet (1 mg total) by mouth daily. 90 tablet 1  . guaiFENesin (MUCINEX) 600 MG 12 hr tablet Take by mouth 2 (two) times daily.    . Multiple Vitamins-Minerals (MULTIVITAMIN GUMMIES MENS PO) Take 1 Dose by mouth daily.     . ondansetron (ZOFRAN) 4 MG tablet Take 1 tablet (4 mg total) by mouth every 8 (eight) hours as needed for nausea or vomiting. 20 tablet 2  . prochlorperazine (COMPAZINE) 10 MG tablet Take 1 tablet (10 mg total) by mouth every 6 (six) hours as  needed for nausea or vomiting. 40 tablet 1  . QUEtiapine (SEROQUEL) 25 MG tablet Take 1 tablet (25 mg total) by mouth at bedtime. 90 tablet 1   No current facility-administered medications for this visit.   Facility-Administered Medications Ordered in Other Visits  Medication Dose Route Frequency Provider Last Rate Last Admin  . CARBOplatin (PARAPLATIN) 330 mg in sodium chloride 0.9 % 250 mL chemo infusion  330 mg Intravenous Once Cammie Sickle, MD      . pembrolizumab [facility]) 200 mg in sodium chloride 0.9 % 50 mL chemo infusion  200 mg Intravenous Once Charlaine Dalton R, MD      . PEMEtrexed (ALIMTA) 800 mg in sodium chloride 0.9 % 100 mL chemo infusion  500 mg/m2 (Treatment Plan Recorded) Intravenous Once Cammie Sickle, MD          .  PHYSICAL EXAMINATION: ECOG PERFORMANCE STATUS: 3 - Symptomatic, >50% confined to bed  Vitals:   02/22/20 0846  BP: (!) 112/63  Pulse: 72  Resp: 18  Temp: (!) 97.2 F (36.2 C)  SpO2: 99%   Filed Weights   02/22/20 0846  Weight: 127 lb 8 oz (57.8 kg)    Physical Exam Constitutional:      Comments: Thin built frail appearing male patient.  No acute distress.  HEENT negative.  Accompanied by his daughter  HENT:     Head: Normocephalic and atraumatic.     Mouth/Throat:     Pharynx: No oropharyngeal exudate.  Eyes:     Pupils: Pupils are equal, round, and reactive to light.  Cardiovascular:     Rate and Rhythm: Normal rate and regular rhythm.  Pulmonary:     Effort: No respiratory distress.     Breath sounds: No wheezing.     Comments: Decreased air entry bilaterally. Abdominal:     General: Bowel sounds are normal. There is no distension.     Palpations: Abdomen is soft. There is no mass.     Tenderness: There is no abdominal tenderness. There is no guarding or rebound.  Musculoskeletal:        General: No tenderness. Normal range of motion.     Cervical back: Normal range of motion and neck supple.  Skin:     General: Skin is warm.  Neurological:     Mental Status: He is alert and oriented to person, place, and time.  Psychiatric:  Mood and Affect: Affect normal.      LABORATORY DATA:  I have reviewed the data as listed Lab Results  Component Value Date   WBC 3.5 (L) 02/22/2020   HGB 13.2 02/22/2020   HCT 41.6 02/22/2020   MCV 88.7 02/22/2020   PLT 250 02/22/2020   Recent Labs    01/11/20 0841 01/11/20 0841 02/01/20 0837 02/10/20 0822 02/22/20 0831  NA 136   < > 137 136 137  K 4.0   < > 4.0 4.1 4.0  CL 95*   < > 97* 97* 100  CO2 31   < > 30 28 31   GLUCOSE 174*   < > 141* 164* 117*  BUN 12   < > 10 16 16   CREATININE 0.59*   < > 0.54* 0.67 0.59*  CALCIUM 8.8*   < > 9.2 8.8* 8.7*  GFRNONAA >60   < > >60 >60 >60  GFRAA >60   < > >60 >60 >60  PROT 7.2  --  7.4  --  6.7  ALBUMIN 3.1*  --  3.4*  --  3.7  AST 30  --  32  --  23  ALT 23  --  26  --  20  ALKPHOS 222*  --  159*  --  94  BILITOT 0.5  --  0.3  --  0.5   < > = values in this interval not displayed.    RADIOGRAPHIC STUDIES: I have personally reviewed the radiological images as listed and agreed with the findings in the report. CT Chest W Contrast  Result Date: 02/20/2020 CLINICAL DATA:  Follow-up right lung cancer EXAM: CT CHEST WITH CONTRAST TECHNIQUE: Multidetector CT imaging of the chest was performed during intravenous contrast administration. CONTRAST:  66m OMNIPAQUE IOHEXOL 300 MG/ML  SOLN COMPARISON:  PET-CT dated 12/06/2019. CTA chest dated 11/29/2019. CT chest abdomen pelvis dated 10/31/2019. FINDINGS: Cardiovascular: The heart is normal in size. No pericardial effusion. No evidence of thoracic aneurysm. Atherosclerotic calcifications of the aortic arch. Coronary atherosclerosis of the LAD. Mediastinum/Nodes: No suspicious mediastinal lymphadenopathy. Visualized thyroid is unremarkable. Lungs/Pleura: 2.2 x 1.9 cm spiculated nodule in the anterior right upper lobe, previously 2.4 x 2.0 cm on prior CT chest  dated 11/29/2019. Associated pleural-based opacity beneath the anterior right hemithorax (series 2/image 43), similar. Suspected involvement of the overlying right anterior 1st/2nd and posterior 3rd/4th ribs, unchanged. Severe centrilobular and paraseptal emphysematous changes, upper lung predominant. No focal consolidation. No pleural effusion or pneumothorax. Upper Abdomen: Visualized upper abdomen is notable for at least 6 hepatic metastases, including a dominant 2.5 cm mass in the left hepatic dome (series 2/image 147), improved from April 2021. Musculoskeletal: Severe pathologic compression fracture deformity at T3. Mild superior endplate pathologic compression fracture deformity at T4. Status post T1-6 posterior fixation hardware with associated streak artifact, obscuring evaluation, although residual soft tissue in the right paraspinal region measuring 2.5 x 3.4 cm is suspected at the T3-4 level (series 3/image 34), previously 2.5 x 4.8 cm. Mild degenerative changes of the visualized thoracolumbar spine. IMPRESSION: 2.2 cm spiculated nodule in the anterior right upper lobe, corresponding to known primary bronchogenic neoplasm, possibly mildly improved. Associated pleural-based opacity with involvement of the right upper chest wall/ribs, as described above, grossly unchanged. Osseous destruction involving the L3-4 vertebral bodies, status post T1-6 posterior fixation. Associated 2.5 x 3.4 cm right paraspinal soft tissue mass, improved. Multifocal hepatic metastases, improved. Aortic Atherosclerosis (ICD10-I70.0) and Emphysema (ICD10-J43.9). Electronically Signed   By: SBertis Ruddy  [place] Pink M.D.   On: 02/20/2020 17:01    ASSESSMENT & PLAN:   Cancer of upper lobe of right lung ([place]) # Stage IV metastatic non-small cell lung cancer favor adeno; mets to bone; liver; NGS pending.  PET scan May 2021-shows multiple liver lesions; lung lesions bone lesions.Currently on Keytruda x2; Keytruda+Carbo [AUC-4]+Alimta. CT  scan July 27th-partial response [stable lung lesion; improved para spinal mass; improvement of the liver lesions]  # proceed with carbo-alimta-keytruda #2  Labs today reviewed;  acceptable for treatment today. Discussed patient will need total of 4 cycles of chemoimmunotherapy; followed by maintenance Keytruda.  # Diarrhea- G-1-? keytrda-  STABLE.   #Bone metastases- on zometa 3 mg IVPB.  Every 4 weeks. Ca-8.8; recommend ca+ vit D BID  #Malnutrition/cachexia-continue weight loss/underlying malignancy.  S/p nutrition evaluation-STABLE.   # COPD-STABLE;  Trelegy.  # Pain-underlying malignancy; STABLE; prn qhsTylenol.  # Insomnia-continue Seroquel.STABLE.   #Debility-spinal cord compression/status post surgery-on physical therapy-STABLE.   #I IV access-interested will proceed with port referral.   # DISPOSITION: # refer to IR re: port placement.  # Chemo today- # follow up in 3 weeks-  MD;labs- cbc/cmp; carbo-alimta- Keytruda; Zometa Dr.B   All questions were answered. The patient knows to call the clinic with any problems, questions or concerns.    Cammie Sickle, MD 02/22/2020 10:23 AM

## 2020-02-22 NOTE — Progress Notes (Signed)
Patient on schedule for [facility] Placement 02/24/2020, spoke with patient with pre procedure instructions given, made aware to be here at 0730, NPO after [place] as well as driver for home after discharge, stated understanding.

## 2020-02-24 ENCOUNTER — Other Ambulatory Visit: Payer: Self-pay

## 2020-02-24 ENCOUNTER — Ambulatory Visit
Admission: RE | Admit: 2020-02-24 | Discharge: 2020-02-24 | Disposition: A | Discharge status: Home / Self Care | Payer: Medicare HMO | Source: Ambulatory Visit | Attending: Internal Medicine | Admitting: Internal Medicine

## 2020-02-24 DIAGNOSIS — C787 Secondary malignant neoplasm of liver and intrahepatic bile duct: Secondary | ICD-10-CM | POA: Diagnosis not present

## 2020-02-24 DIAGNOSIS — Z9981 Dependence on supplemental oxygen: Secondary | ICD-10-CM | POA: Insufficient documentation

## 2020-02-24 DIAGNOSIS — Z452 Encounter for adjustment and management of vascular access device: Secondary | ICD-10-CM | POA: Diagnosis not present

## 2020-02-24 DIAGNOSIS — Z923 Personal history of irradiation: Secondary | ICD-10-CM | POA: Diagnosis not present

## 2020-02-24 DIAGNOSIS — Z85118 Personal history of other malignant neoplasm of bronchus and lung: Secondary | ICD-10-CM | POA: Diagnosis not present

## 2020-02-24 DIAGNOSIS — J449 Chronic obstructive pulmonary disease, unspecified: Secondary | ICD-10-CM | POA: Diagnosis not present

## 2020-02-24 DIAGNOSIS — Z7951 Long term (current) use of inhaled steroids: Secondary | ICD-10-CM | POA: Diagnosis not present

## 2020-02-24 DIAGNOSIS — C3411 Malignant neoplasm of upper lobe, right bronchus or lung: Secondary | ICD-10-CM | POA: Diagnosis present

## 2020-02-24 DIAGNOSIS — Z87891 Personal history of nicotine dependence: Secondary | ICD-10-CM | POA: Insufficient documentation

## 2020-02-24 DIAGNOSIS — Z5111 Encounter for antineoplastic chemotherapy: Secondary | ICD-10-CM | POA: Diagnosis not present

## 2020-02-24 DIAGNOSIS — C7951 Secondary malignant neoplasm of bone: Secondary | ICD-10-CM | POA: Diagnosis not present

## 2020-02-24 DIAGNOSIS — Z79899 Other long term (current) drug therapy: Secondary | ICD-10-CM | POA: Insufficient documentation

## 2020-02-24 DIAGNOSIS — C3490 Malignant neoplasm of unspecified part of unspecified bronchus or lung: Secondary | ICD-10-CM | POA: Diagnosis not present

## 2020-02-24 HISTORY — PX: IR IMAGING GUIDED PORT INSERTION: IMG5740

## 2020-02-24 MED ORDER — CEFAZOLIN SODIUM-DEXTROSE 2-4 GM/100ML-% IV SOLN
2.0000 g | Freq: Once | INTRAVENOUS | Status: AC
  Filled 2020-02-24: qty 100

## 2020-02-24 MED ORDER — CEFAZOLIN SODIUM-DEXTROSE 2-4 GM/100ML-% IV SOLN
INTRAVENOUS | Status: AC
  Filled 2020-02-24: qty 100

## 2020-02-24 MED ORDER — MIDAZOLAM HCL 2 MG/2ML IJ SOLN
INTRAMUSCULAR | Status: AC
  Administered 2020-02-24: 1 mg
  Filled 2020-02-24: qty 2

## 2020-02-24 MED ORDER — FENTANYL CITRATE (PF) 100 MCG/2ML IJ SOLN
INTRAMUSCULAR | Status: AC
  Administered 2020-02-24: 25 ug
  Filled 2020-02-24: qty 2

## 2020-02-24 MED ORDER — FENTANYL CITRATE (PF) 100 MCG/2ML IJ SOLN
INTRAMUSCULAR | Status: AC | PRN
  Administered 2020-02-24: 25 ug via INTRAVENOUS

## 2020-02-24 MED ORDER — SODIUM CHLORIDE 0.9 % IV SOLN
INTRAVENOUS | Status: DC
  Administered 2020-02-24: 08:00:00 1000 mL via INTRAVENOUS

## 2020-02-24 NOTE — Discharge Instructions (Signed)
Implanted Port Insertion, Care After °This sheet gives you information about how to care for yourself after your procedure. Your health care provider may also give you more specific instructions. If you have problems or questions, contact your health care provider. °What can I expect after the procedure? °After the procedure, it is common to have: °· Discomfort at the port insertion site. °· Bruising on the skin over the port. This should improve over 3-4 days. °Follow these instructions at home: °Port care °· After your port is placed, you will get a manufacturer's information card. The card has information about your port. Keep this card with you at all times. °· Take care of the port as told by your health care provider. Ask your health care provider if you or a family member can get training for taking care of the port at home. A home health care nurse may also take care of the port. °· Make sure to remember what type of port you have. °Incision care ° °  ° °· Follow instructions from your health care provider about how to take care of your port insertion site. Make sure you: °? Wash your hands with soap and water before and after you change your bandage (dressing). If soap and water are not available, use hand sanitizer. °? Change your dressing as told by your health care provider. °? Leave stitches (sutures), skin glue, or adhesive strips in place. These skin closures may need to stay in place for 2 weeks or longer. If adhesive strip edges start to loosen and curl up, you may trim the loose edges. Do not remove adhesive strips completely unless your health care provider tells you to do that. °· Check your port insertion site every day for signs of infection. Check for: °? Redness, swelling, or pain. °? Fluid or blood. °? Warmth. °? Pus or a bad smell. °Activity °· Return to your normal activities as told by your health care provider. Ask your health care provider what activities are safe for you. °· Do not  lift anything that is heavier than 10 lb (4.5 kg), or the limit that you are told, until your health care provider says that it is safe. °General instructions °· Take over-the-counter and prescription medicines only as told by your health care provider. °· Do not take baths, swim, or use a hot tub until your health care provider approves. Ask your health care provider if you may take showers. You may only be allowed to take sponge baths. °· Do not drive for 24 hours if you were given a sedative during your procedure. °· Wear a medical alert bracelet in case of an emergency. This will tell any health care providers that you have a port. °· Keep all follow-up visits as told by your health care provider. This is important. °Contact a health care provider if: °· You cannot flush your port with saline as directed, or you cannot draw blood from the port. °· You have a fever or chills. °· You have redness, swelling, or pain around your port insertion site. °· You have fluid or blood coming from your port insertion site. °· Your port insertion site feels warm to the touch. °· You have pus or a bad smell coming from the port insertion site. °Get help right away if: °· You have chest pain or shortness of breath. °· You have bleeding from your port that you cannot control. °Summary °· Take care of the port as told by your health   care provider. Keep the manufacturer's information card with you at all times. °· Change your dressing as told by your health care provider. °· Contact a health care provider if you have a fever or chills or if you have redness, swelling, or pain around your port insertion site. °· Keep all follow-up visits as told by your health care provider. °This information is not intended to replace advice given to you by your health care provider. Make sure you discuss any questions you have with your health care provider. °Document Revised: 02/09/2018 Document Reviewed: 02/09/2018 °Elsevier Patient Education ©  2020 [employer] ° °

## 2020-02-24 NOTE — Procedures (Signed)
Interventional Radiology Procedure Note  Procedure: Single Lumen Power Port Placement    Access:  Right IJ vein.  Findings: Catheter tip positioned at SVC/RA junction. Port is ready for immediate use.   Complications: None  EBL: < 10 mL  Recommendations:  - Ok to shower in 24 hours - Do not submerge for 7 days - Routine line care   Mckenzee Beem T. Ndrew Creason, M.D Pager:  [phone]   

## 2020-02-24 NOTE — H&P (Signed)
Chief Complaint: Patient was seen in consultation today for port placement at the request of Brahmanday,Govinda R  Referring Physician(s): Brahmanday,Govinda R  Patient Status: [facility] - Out-pt  History of Present Illness: Cody [name] is a 64 y.o. 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 Undergoing chemotherapy since May and s/p tumor resection and radiation therapy to spine. Now presents for port placement due to poor intravenous access and need for continued chemotherapy and blood draws. On 2L continuous home O2. Denies any current symptoms.  Past Medical History:  Diagnosis Date  . Allergy   . COPD (chronic obstructive pulmonary disease) ([place])   . Prostate enlargement     Past Surgical History:  Procedure Laterality Date  . HERNIA REPAIR Bilateral 1982  . HERNIA REPAIR Right 1994  . LAMINECTOMY FOR EXCISION / EVACUATION INTRASPINAL LESION  11/07/2019   T1-T 6 at [facility]     Allergies: Patient has no known allergies.  Medications: Prior to Admission medications   Medication Sig Start Date End Date Taking? Authorizing Provider  Calcium Carb-Cholecalciferol (OYSTER SHELL CALCIUM) 500-400 MG-UNIT TABS Take 500 tablets by mouth.   Yes [provider]  escitalopram (LEXAPRO) 5 MG tablet Take 1 tablet (5 mg total) by mouth daily. am's 01/05/20  Yes Sowles, Drue Stager, MD  Fluticasone-Umeclidin-Vilant (TRELEGY ELLIPTA) 100-62.5-25 MCG/INH AEPB Inhale 1 puff into the lungs daily. 01/05/20  Yes Sowles, Drue Stager, MD  folic acid (FOLVITE) 1 MG tablet Take 1 tablet (1 mg total) by mouth daily. 02/01/20  Yes Cammie Sickle, MD  Multiple Vitamins-Minerals (MULTIVITAMIN GUMMIES MENS PO) Take 1 Dose by mouth daily.    Yes [provider]  ondansetron (ZOFRAN) 4 MG tablet Take 1 tablet (4 mg total) by mouth every 8 (eight) hours as needed for nausea or vomiting. 12/07/19  Yes Borders, Kirt Boys, NP  prochlorperazine  (COMPAZINE) 10 MG tablet Take 1 tablet (10 mg total) by mouth every 6 (six) hours as needed for nausea or vomiting. 12/21/19  Yes Cammie Sickle, MD  QUEtiapine (SEROQUEL) 25 MG tablet Take 1 tablet (25 mg total) by mouth at bedtime. 01/05/20  Yes Sowles, Drue Stager, MD  acetaminophen (TYLENOL) 500 MG tablet Take 500-1,000 mg by mouth every 6 (six) hours as needed for mild pain or fever.     [provider]  albuterol (VENTOLIN HFA) 108 (90 Base) MCG/ACT inhaler Inhale 2 puffs into the lungs every 4 (four) hours as needed for wheezing or shortness of breath. 01/05/20   Steele Sizer, MD  benzonatate (TESSALON) 100 MG capsule Take 1 capsule (100 mg total) by mouth 3 (three) times daily as needed for cough. Patient not taking: Reported on 02/24/2020 01/11/20   Cammie Sickle, MD  chlorpheniramine-HYDROcodone [facility] PENNKINETIC ER) 10-8 MG/5ML SUER Take 5 mLs by mouth every 12 (twelve) hours as needed. Patient not taking: Reported on 02/24/2020 01/05/20   Steele Sizer, MD  guaiFENesin (MUCINEX) 600 MG 12 hr tablet Take by mouth 2 (two) times daily.    [provider]     Family History  Problem Relation Age of Onset  . Heart disease Father   . CVA Mother   . Lung cancer Sister   . Lung cancer Brother     Social History   Socioeconomic History  . Marital status: Married    Spouse name: [name]   . Number of children: 4  . Years of education: Not on file  .  Highest education level: Some college, no degree  Occupational History  . Occupation: dispatch and delivery   Tobacco Use  . Smoking status: Former Smoker    Packs/day: 2.00    Years: 30.00    Pack years: 60.00    Quit date: 07/29/2011    Years since quitting: 8.5  . Smokeless tobacco: Never Used  . Tobacco comment: pt vapes  Vaping Use  . Vaping Use: Never assessed  Substance and Sexual Activity  . Alcohol use: Yes    Alcohol/week: 5.0 standard drinks    Types: 5 Standard drinks or equivalent per  week    Comment: 1-2/day  . Drug use: No  . Sexual activity: Not Currently    Partners: Female  Other Topics Concern  . Not on file  Social History Narrative   Worked in warehouse/ quit smoking in 2015; beer 1/day; in [place]; with wife.    Social Determinants of Health   Financial Resource Strain: Low Risk   . Difficulty of Paying Living Expenses: Not hard at all  Food Insecurity: No Food Insecurity  . Worried About Charity fundraiser in the Last Year: Never true  . Ran Out of Food in the Last Year: Never true  Transportation Needs: No Transportation Needs  . Lack of Transportation (Medical): No  . Lack of Transportation (Non-Medical): No  Physical Activity: Insufficiently Active  . Days of Exercise per Week: 7 days  . Minutes of Exercise per Session: 20 min  Stress: No Stress Concern Present  . Feeling of Stress : Not at all  Social Connections: Socially Integrated  . Frequency of Communication with Friends and Family: More than three times a week  . Frequency of Social Gatherings with Friends and Family: More than three times a week  . Attends Religious Services: More than 4 times per year  . Active Member of Clubs or Organizations: Yes  . Attends Archivist Meetings: More than 4 times per year  . Marital Status: Married    ECOG Status: 0 - Asymptomatic  Review of Systems: A 12 point ROS discussed and pertinent positives are indicated in the HPI above.  All other systems are negative.  Review of Systems  Constitutional: Negative.   Respiratory: Negative.   Cardiovascular: Negative.   Gastrointestinal: Negative.   Genitourinary: Negative.   Musculoskeletal: Negative.   Neurological: Negative.     Vital Signs: BP 117/73   Pulse 64   Temp 97.9 F (36.6 C) (Oral)   Resp 20   Ht 5\' 9"  (1.753 m)   Wt 56.7 kg   SpO2 100%   BMI 18.46 kg/m   Physical Exam Vitals reviewed.  Constitutional:      General: He is not in acute distress.     Appearance: He is not toxic-appearing or diaphoretic.     Comments: cachectic  HENT:     Head: Normocephalic and atraumatic.  Cardiovascular:     Rate and Rhythm: Normal rate and regular rhythm.     Heart sounds: Normal heart sounds. No murmur heard.  No friction rub. No gallop.   Pulmonary:     Effort: Pulmonary effort is normal. No respiratory distress.     Breath sounds: No stridor. No wheezing, rhonchi or rales.     Comments: Distant bilateral breath sounds Abdominal:     General: Abdomen is flat. There is no distension.     Palpations: Abdomen is soft. There is no mass.     Tenderness: There is  no abdominal tenderness. There is no guarding or rebound.  Musculoskeletal:        General: No swelling.     Cervical back: Neck supple.  Skin:    General: Skin is warm and dry.  Neurological:     General: No focal deficit present.     Mental Status: He is alert and oriented to person, place, and time.     Imaging: CT Chest W Contrast  Result Date: 02/20/2020 CLINICAL DATA:  Follow-up right lung cancer EXAM: CT CHEST WITH CONTRAST TECHNIQUE: Multidetector CT imaging of the chest was performed during intravenous contrast administration. CONTRAST:  83mL OMNIPAQUE IOHEXOL 300 MG/ML  SOLN COMPARISON:  PET-CT dated 12/06/2019. CTA chest dated 11/29/2019. CT chest abdomen pelvis dated 10/31/2019. FINDINGS: Cardiovascular: The heart is normal in size. No pericardial effusion. No evidence of thoracic aneurysm. Atherosclerotic calcifications of the aortic arch. Coronary atherosclerosis of the LAD. Mediastinum/Nodes: No suspicious mediastinal lymphadenopathy. Visualized thyroid is unremarkable. Lungs/Pleura: 2.2 x 1.9 cm spiculated nodule in the anterior right upper lobe, previously 2.4 x 2.0 cm on prior CT chest dated 11/29/2019. Associated pleural-based opacity beneath the anterior right hemithorax (series 2/image 43), similar. Suspected involvement of the overlying right anterior 1st/2nd and  posterior 3rd/4th ribs, unchanged. Severe centrilobular and paraseptal emphysematous changes, upper lung predominant. No focal consolidation. No pleural effusion or pneumothorax. Upper Abdomen: Visualized upper abdomen is notable for at least 6 hepatic metastases, including a dominant 2.5 cm mass in the left hepatic dome (series 2/image 147), improved from April 2021. Musculoskeletal: Severe pathologic compression fracture deformity at T3. Mild superior endplate pathologic compression fracture deformity at T4. Status post T1-6 posterior fixation hardware with associated streak artifact, obscuring evaluation, although residual soft tissue in the right paraspinal region measuring 2.5 x 3.4 cm is suspected at the T3-4 level (series 3/image 34), previously 2.5 x 4.8 cm. Mild degenerative changes of the visualized thoracolumbar spine. IMPRESSION: 2.2 cm spiculated nodule in the anterior right upper lobe, corresponding to known primary bronchogenic neoplasm, possibly mildly improved. Associated pleural-based opacity with involvement of the right upper chest wall/ribs, as described above, grossly unchanged. Osseous destruction involving the L3-4 vertebral bodies, status post T1-6 posterior fixation. Associated 2.5 x 3.4 cm right paraspinal soft tissue mass, improved. Multifocal hepatic metastases, improved. Aortic Atherosclerosis (ICD10-I70.0) and Emphysema (ICD10-J43.9). Electronically Signed   By: Julian Hy M.D.   On: 02/20/2020 17:01    Labs:  CBC: Recent Labs    01/11/20 0841 02/01/20 0837 02/10/20 0822 02/22/20 0831  WBC 5.8 5.2 2.2* 3.5*  HGB 13.0 13.8 13.1 13.2  HCT 41.0 42.0 40.4 41.6  PLT 290 241 156 250    COAGS: No results for input(s): INR, APTT in the last 8760 hours.  BMP: Recent Labs    01/11/20 0841 02/01/20 0837 02/10/20 0822 02/22/20 0831  NA 136 137 136 137  K 4.0 4.0 4.1 4.0  CL 95* 97* 97* 100  CO2 31 30 28 31   GLUCOSE 174* 141* 164* 117*  BUN 12 10 16 16     CALCIUM 8.8* 9.2 8.8* 8.7*  CREATININE 0.59* 0.54* 0.67 0.59*  GFRNONAA >60 >60 >60 >60  GFRAA >60 >60 >60 >60    LIVER FUNCTION TESTS: Recent Labs    12/21/19 1010 01/11/20 0841 02/01/20 0837 02/22/20 0831  BILITOT 0.4 0.5 0.3 0.5  AST 29 30 32 23  ALT 26 23 26 20   ALKPHOS 250* 222* 159* 94  PROT 7.3 7.2 7.4 6.7  ALBUMIN 3.0* 3.1* 3.4*  3.7    TUMOR MARKERS: No results for input(s): AFPTM, CEA, CA199, CHROMGRNA in the last 8760 hours.  Assessment and Plan:  For port placement today. Risksand benefits of image guided port-a-catheter placement was discussed with the patient including, but not limited to bleeding, infection, pneumothorax, or fibrin sheath development and need for additional procedures.  All of the patient's questions were answered, patient is agreeable to proceed. Consent signed and in chart.  Thank you for this interesting consult.  I greatly enjoyed meeting ADRIENNE DELAY and look forward to participating in their care.  A copy of this report was sent to the requesting provider on this date.  Electronically Signed: Azzie Roup, MD 02/24/2020, 8:08 AM     I spent a total of 15 Minutes in face to face in clinical consultation, greater than 50% of which was counseling/coordinating care for port placement.

## 2020-02-27 DIAGNOSIS — R69 Illness, unspecified: Secondary | ICD-10-CM | POA: Diagnosis not present

## 2020-02-27 DIAGNOSIS — R531 Weakness: Secondary | ICD-10-CM | POA: Diagnosis not present

## 2020-02-27 DIAGNOSIS — R269 Unspecified abnormalities of gait and mobility: Secondary | ICD-10-CM | POA: Diagnosis not present

## 2020-02-27 DIAGNOSIS — C412 Malignant neoplasm of vertebral column: Secondary | ICD-10-CM | POA: Diagnosis not present

## 2020-02-27 DIAGNOSIS — C7951 Secondary malignant neoplasm of bone: Secondary | ICD-10-CM | POA: Diagnosis not present

## 2020-02-27 DIAGNOSIS — C7800 Secondary malignant neoplasm of unspecified lung: Secondary | ICD-10-CM | POA: Diagnosis not present

## 2020-02-27 DIAGNOSIS — J449 Chronic obstructive pulmonary disease, unspecified: Secondary | ICD-10-CM | POA: Diagnosis not present

## 2020-02-27 DIAGNOSIS — Z809 Family history of malignant neoplasm, unspecified: Secondary | ICD-10-CM | POA: Diagnosis not present

## 2020-02-27 DIAGNOSIS — C787 Secondary malignant neoplasm of liver and intrahepatic bile duct: Secondary | ICD-10-CM | POA: Diagnosis not present

## 2020-02-27 DIAGNOSIS — Z823 Family history of stroke: Secondary | ICD-10-CM | POA: Diagnosis not present

## 2020-03-01 DIAGNOSIS — R531 Weakness: Secondary | ICD-10-CM | POA: Diagnosis not present

## 2020-03-01 DIAGNOSIS — J9601 Acute respiratory failure with hypoxia: Secondary | ICD-10-CM | POA: Diagnosis not present

## 2020-03-01 DIAGNOSIS — J441 Chronic obstructive pulmonary disease with (acute) exacerbation: Secondary | ICD-10-CM | POA: Diagnosis not present

## 2020-03-06 DIAGNOSIS — R531 Weakness: Secondary | ICD-10-CM | POA: Diagnosis not present

## 2020-03-07 ENCOUNTER — Other Ambulatory Visit: Payer: Self-pay | Admitting: *Deleted

## 2020-03-07 ENCOUNTER — Telehealth: Payer: Self-pay | Admitting: *Deleted

## 2020-03-07 DIAGNOSIS — C3411 Malignant neoplasm of upper lobe, right bronchus or lung: Secondary | ICD-10-CM

## 2020-03-07 DIAGNOSIS — C7951 Secondary malignant neoplasm of bone: Secondary | ICD-10-CM

## 2020-03-07 MED ORDER — LIDOCAINE-PRILOCAINE 2.5-2.5 % EX CREA
1.0000 | TOPICAL_CREAM | CUTANEOUS | 1 refills | Status: DC | PRN
Start: 2020-03-07 — End: 2023-05-20

## 2020-03-07 NOTE — Telephone Encounter (Signed)
-----   Message from Adelina Mings, [facility] sent at 03/07/2020  8:54 AM EDT ----- Regarding: tsh Last TSH level drawn in May with cycle 1, consider ordering another level with cycle 5 on 03/14/20.  Thanks.

## 2020-03-07 NOTE — Telephone Encounter (Signed)
Patient called reporting that he had port inserted and that he needs EMLA Cream sent to [place]

## 2020-03-08 DIAGNOSIS — R531 Weakness: Secondary | ICD-10-CM | POA: Diagnosis not present

## 2020-03-12 ENCOUNTER — Inpatient Hospital Stay: Payer: Medicare HMO | Admitting: Hospice and Palliative Medicine

## 2020-03-14 ENCOUNTER — Inpatient Hospital Stay: Payer: Medicare HMO | Attending: Internal Medicine

## 2020-03-14 ENCOUNTER — Inpatient Hospital Stay (HOSPITAL_BASED_OUTPATIENT_CLINIC_OR_DEPARTMENT_OTHER): Payer: Medicare HMO | Admitting: Hospice and Palliative Medicine

## 2020-03-14 ENCOUNTER — Encounter: Payer: Self-pay | Admitting: Internal Medicine

## 2020-03-14 ENCOUNTER — Inpatient Hospital Stay (HOSPITAL_BASED_OUTPATIENT_CLINIC_OR_DEPARTMENT_OTHER): Payer: Medicare HMO | Admitting: Internal Medicine

## 2020-03-14 ENCOUNTER — Other Ambulatory Visit: Payer: Self-pay

## 2020-03-14 ENCOUNTER — Inpatient Hospital Stay: Payer: Medicare HMO

## 2020-03-14 DIAGNOSIS — C787 Secondary malignant neoplasm of liver and intrahepatic bile duct: Secondary | ICD-10-CM | POA: Insufficient documentation

## 2020-03-14 DIAGNOSIS — Z8249 Family history of ischemic heart disease and other diseases of the circulatory system: Secondary | ICD-10-CM | POA: Insufficient documentation

## 2020-03-14 DIAGNOSIS — R635 Abnormal weight gain: Secondary | ICD-10-CM | POA: Insufficient documentation

## 2020-03-14 DIAGNOSIS — R531 Weakness: Secondary | ICD-10-CM | POA: Diagnosis not present

## 2020-03-14 DIAGNOSIS — J449 Chronic obstructive pulmonary disease, unspecified: Secondary | ICD-10-CM | POA: Diagnosis not present

## 2020-03-14 DIAGNOSIS — R64 Cachexia: Secondary | ICD-10-CM | POA: Diagnosis not present

## 2020-03-14 DIAGNOSIS — E46 Unspecified protein-calorie malnutrition: Secondary | ICD-10-CM | POA: Diagnosis not present

## 2020-03-14 DIAGNOSIS — Z5111 Encounter for antineoplastic chemotherapy: Secondary | ICD-10-CM | POA: Insufficient documentation

## 2020-03-14 DIAGNOSIS — C3410 Malignant neoplasm of upper lobe, unspecified bronchus or lung: Secondary | ICD-10-CM | POA: Diagnosis not present

## 2020-03-14 DIAGNOSIS — C7951 Secondary malignant neoplasm of bone: Secondary | ICD-10-CM

## 2020-03-14 DIAGNOSIS — Z7289 Other problems related to lifestyle: Secondary | ICD-10-CM | POA: Diagnosis not present

## 2020-03-14 DIAGNOSIS — Z801 Family history of malignant neoplasm of trachea, bronchus and lung: Secondary | ICD-10-CM | POA: Diagnosis not present

## 2020-03-14 DIAGNOSIS — Z79899 Other long term (current) drug therapy: Secondary | ICD-10-CM | POA: Diagnosis not present

## 2020-03-14 DIAGNOSIS — C3411 Malignant neoplasm of upper lobe, right bronchus or lung: Secondary | ICD-10-CM

## 2020-03-14 DIAGNOSIS — R5381 Other malaise: Secondary | ICD-10-CM | POA: Diagnosis not present

## 2020-03-14 DIAGNOSIS — M549 Dorsalgia, unspecified: Secondary | ICD-10-CM | POA: Insufficient documentation

## 2020-03-14 DIAGNOSIS — Z5112 Encounter for antineoplastic immunotherapy: Secondary | ICD-10-CM | POA: Insufficient documentation

## 2020-03-14 DIAGNOSIS — I700 Atherosclerosis of aorta: Secondary | ICD-10-CM | POA: Diagnosis not present

## 2020-03-14 DIAGNOSIS — M2550 Pain in unspecified joint: Secondary | ICD-10-CM | POA: Diagnosis not present

## 2020-03-14 DIAGNOSIS — N400 Enlarged prostate without lower urinary tract symptoms: Secondary | ICD-10-CM | POA: Diagnosis not present

## 2020-03-14 DIAGNOSIS — J9691 Respiratory failure, unspecified with hypoxia: Secondary | ICD-10-CM | POA: Diagnosis not present

## 2020-03-14 DIAGNOSIS — Z87891 Personal history of nicotine dependence: Secondary | ICD-10-CM | POA: Insufficient documentation

## 2020-03-14 DIAGNOSIS — Z515 Encounter for palliative care: Secondary | ICD-10-CM

## 2020-03-14 DIAGNOSIS — G4700 Insomnia, unspecified: Secondary | ICD-10-CM | POA: Diagnosis not present

## 2020-03-14 DIAGNOSIS — I2510 Atherosclerotic heart disease of native coronary artery without angina pectoris: Secondary | ICD-10-CM | POA: Diagnosis not present

## 2020-03-14 DIAGNOSIS — Z7189 Other specified counseling: Secondary | ICD-10-CM

## 2020-03-14 LAB — COMPREHENSIVE METABOLIC PANEL
ALT: 20 U/L (ref 0–44)
AST: 24 U/L (ref 15–41)
Albumin: 3.8 g/dL (ref 3.5–5.0)
Alkaline Phosphatase: 82 U/L (ref 38–126)
Anion gap: 7 (ref 5–15)
BUN: 19 mg/dL (ref 8–23)
CO2: 29 mmol/L (ref 22–32)
Calcium: 8.8 mg/dL — ABNORMAL LOW (ref 8.9–10.3)
Chloride: 103 mmol/L (ref 98–111)
Creatinine, Ser: 0.56 mg/dL — ABNORMAL LOW (ref 0.61–1.24)
GFR calc Af Amer: >60 mL/min (ref >60)
GFR calc non Af Amer: >60 mL/min (ref >60)
Glucose, Bld: 104 mg/dL — ABNORMAL HIGH (ref 70–99)
Potassium: 3.9 mmol/L (ref 3.5–5.1)
Sodium: 139 mmol/L (ref 135–145)
Total Bilirubin: 0.5 mg/dL (ref 0.3–1.2)
Total Protein: 6.9 g/dL (ref 6.5–8.1)

## 2020-03-14 LAB — CBC WITH DIFFERENTIAL/PLATELET
Abs Immature Granulocytes: 0.01 10*3/uL (ref 0.00–0.07)
Basophils Absolute: 0 10*3/uL (ref 0.0–0.1)
Basophils Relative: 1 %
Eosinophils Absolute: 0.2 10*3/uL (ref 0.0–0.5)
Eosinophils Relative: 4 %
HCT: 40.3 % (ref 39.0–52.0)
Hemoglobin: 13.2 g/dL (ref 13.0–17.0)
Immature Granulocytes: 0 %
Lymphocytes Relative: 17 %
Lymphs Abs: 0.8 10*3/uL (ref 0.7–4.0)
MCH: 28.2 pg (ref 26.0–34.0)
MCHC: 32.8 g/dL (ref 30.0–36.0)
MCV: 86.1 fL (ref 80.0–100.0)
Monocytes Absolute: 0.6 10*3/uL (ref 0.1–1.0)
Monocytes Relative: 13 %
Neutro Abs: 3 10*3/uL (ref 1.7–7.7)
Neutrophils Relative %: 65 %
Platelets: 198 10*3/uL (ref 150–400)
RBC: 4.68 MIL/uL (ref 4.22–5.81)
RDW: 16 % — ABNORMAL HIGH (ref 11.5–15.5)
WBC: 4.7 10*3/uL (ref 4.0–10.5)
nRBC: 0 % (ref 0.0–0.2)

## 2020-03-14 LAB — TSH: TSH: 2.719 u[IU]/mL (ref 0.350–4.500)

## 2020-03-14 MED ORDER — SODIUM CHLORIDE 0.9 % IV SOLN
328.8000 mg | Freq: Once | INTRAVENOUS | Status: AC
  Administered 2020-03-14: 330 mg via INTRAVENOUS
  Filled 2020-03-14: qty 33

## 2020-03-14 MED ORDER — SODIUM CHLORIDE 0.9 % IV SOLN
500.0000 mg/m2 | Freq: Once | INTRAVENOUS | Status: AC
  Administered 2020-03-14: 800 mg via INTRAVENOUS
  Filled 2020-03-14: qty 20

## 2020-03-14 MED ORDER — SODIUM CHLORIDE 0.9 % IV SOLN
200.0000 mg | Freq: Once | INTRAVENOUS | Status: AC
  Administered 2020-03-14: 200 mg via INTRAVENOUS
  Filled 2020-03-14: qty 8

## 2020-03-14 MED ORDER — DEXAMETHASONE SODIUM PHOSPHATE 10 MG/ML IJ SOLN
10.0000 mg | Freq: Once | INTRAMUSCULAR | Status: AC
  Administered 2020-03-14: 10 mg via INTRAVENOUS
  Filled 2020-03-14: qty 1

## 2020-03-14 MED ORDER — SODIUM CHLORIDE 0.9 % IV SOLN
Freq: Once | INTRAVENOUS | Status: AC
  Filled 2020-03-14: qty 250

## 2020-03-14 MED ORDER — SODIUM CHLORIDE 0.9 % IV SOLN
150.0000 mg | Freq: Once | INTRAVENOUS | Status: AC
  Administered 2020-03-14: 150 mg via INTRAVENOUS
  Filled 2020-03-14: qty 5

## 2020-03-14 MED ORDER — HEPARIN SOD (PORK) LOCK FLUSH 100 UNIT/ML IV SOLN
INTRAVENOUS | Status: AC
  Filled 2020-03-14: qty 5

## 2020-03-14 MED ORDER — PALONOSETRON HCL INJECTION 0.25 MG/5ML
0.2500 mg | Freq: Once | INTRAVENOUS | Status: AC
  Administered 2020-03-14: 0.25 mg via INTRAVENOUS
  Filled 2020-03-14: qty 5

## 2020-03-14 MED ORDER — ZOLEDRONIC ACID 4 MG/5ML IV CONC
3.0000 mg | Freq: Once | INTRAVENOUS | Status: AC
  Administered 2020-03-14: 3 mg via INTRAVENOUS
  Filled 2020-03-14: qty 3.75

## 2020-03-14 NOTE — Progress Notes (Signed)
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   # MAY 26th-Keytrda x2 cycles; [borderline PS] July 7th-carbo Alimta Keytruda cycle #1  # NGS/MOLECULAR TESTS:   # PALLIATIVE CARE EVALUATION:  # PAIN MANAGEMENT:    DIAGNOSIS:   STAGE:         ;  GOALS:  CURRENT/MOST RECENT THERAPY :     Cancer of upper lobe of right lung ([place])  11/23/2019 Initial Diagnosis   Cancer of upper lobe of right lung ([place])   12/21/2019 -  Chemotherapy   The patient had dexamethasone (DECADRON) 4 MG tablet, 1 of 1 cycle, Start date: --, End date: -- palonosetron (ALOXI) injection 0.25 mg, 0.25 mg, Intravenous,  Once, 3 of 4 cycles Administration: 0.25 mg (02/01/2020), 0.25 mg (02/22/2020), 0.25 mg (03/14/2020) PEMEtrexed (ALIMTA) 800 mg in sodium chloride 0.9 % 100 mL chemo infusion, 500 mg/m2 = 800 mg, Intravenous,  Once, 3 of 4 cycles Administration: 800 mg (02/01/2020), 800 mg (02/22/2020), 800 mg (03/14/2020) CARBOplatin (PARAPLATIN) 330 mg in sodium chloride 0.9 % 250 mL chemo infusion, 330 mg (100 % of original dose 328.8 mg), Intravenous,  Once, 3 of 4 cycles Dose modification:   (original dose 328.8 mg, Cycle 3),   (original dose 328.8 mg, Cycle 5) Administration: 330 mg (02/01/2020), 330 mg (02/22/2020), 330 mg (03/14/2020) fosaprepitant (EMEND) 150 mg in sodium chloride 0.9 % 145 mL  IVPB, 150 mg, Intravenous,  Once, 3 of 4 cycles Administration: 150 mg (02/01/2020), 150 mg (02/22/2020), 150 mg (03/14/2020) pembrolizumab (KEYTRUDA) 200 mg in sodium chloride 0.9 % 50 mL chemo infusion, 200 mg, Intravenous, Once, 5 of 6 cycles Administration: 200 mg (12/21/2019), 200 mg (02/01/2020), 200 mg (02/22/2020), 200 mg (03/14/2020), 200 mg (01/11/2020)  for chemotherapy treatment.     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  male patient with metastatic non-small cell lung cancer; cord compression status post decompressive surgery; currently on Keytruda- Carbo-alimta is here for follow-up.  Appetite is improving. Gaining weight. No nausea no vomiting pain no diarrhea. Is able to ambulate using a walker. No falls. Chronic shortness of breath home oxygen on 2 L.   Review of Systems  Constitutional: Positive for malaise/fatigue and weight loss. Negative for chills, diaphoresis and fever.  HENT: Negative for nosebleeds and sore throat.   Eyes: Negative for double vision.  Respiratory: Positive for shortness of breath. Negative for cough, hemoptysis, sputum production and wheezing.   Cardiovascular: Negative for chest pain, palpitations, orthopnea and leg swelling.  Gastrointestinal: Negative for abdominal pain, blood in stool, constipation, diarrhea, heartburn, melena, nausea and vomiting.  Genitourinary: Negative for dysuria, frequency and urgency.  Musculoskeletal: Positive for back pain and joint pain.  Skin: Negative.  Negative for itching and rash.  Neurological: Negative for dizziness, tingling, focal weakness, weakness and headaches.  Endo/Heme/Allergies: Does not bruise/bleed easily.  Psychiatric/Behavioral: Negative for depression. The patient is not nervous/anxious and does not have insomnia.  MEDICAL HISTORY:  Past Medical History:  Diagnosis Date  . Allergy   . COPD (chronic obstructive pulmonary disease) ([place])   . Prostate enlargement     SURGICAL  HISTORY: Past Surgical History:  Procedure Laterality Date  . HERNIA REPAIR Bilateral 1982  . HERNIA REPAIR Right 1994  . IR IMAGING GUIDED PORT INSERTION  02/24/2020  . LAMINECTOMY FOR EXCISION / EVACUATION INTRASPINAL LESION  11/07/2019   T1-T 6 at [facility]     SOCIAL HISTORY: Social History   Socioeconomic History  . Marital status: Married    Spouse name: [name]   . Number of children: 4  . Years of education: Not on file  . Highest education level: Some college, no degree  Occupational History  . Occupation: dispatch and delivery   Tobacco Use  . Smoking status: Former Smoker    Packs/day: 2.00    Years: 30.00    Pack years: 60.00    Quit date: 07/29/2011    Years since quitting: 8.6  . Smokeless tobacco: Never Used  . Tobacco comment: pt vapes  Vaping Use  . Vaping Use: Never assessed  Substance and Sexual Activity  . Alcohol use: Yes    Alcohol/week: 5.0 standard drinks    Types: 5 Standard drinks or equivalent per week    Comment: 1-2/day  . Drug use: No  . Sexual activity: Not Currently    Partners: Female  Other Topics Concern  . Not on file  Social History Narrative   Worked in warehouse/ quit smoking in 2015; beer 1/day; in [place]; with wife.    Social Determinants of Health   Financial Resource Strain: Low Risk   . Difficulty of Paying Living Expenses: Not hard at all  Food Insecurity: No Food Insecurity  . Worried About Charity fundraiser in the Last Year: Never true  . Ran Out of Food in the Last Year: Never true  Transportation Needs: No Transportation Needs  . Lack of Transportation (Medical): No  . Lack of Transportation (Non-Medical): No  Physical Activity: Insufficiently Active  . Days of Exercise per Week: 7 days  . Minutes of Exercise per Session: 20 min  Stress: No Stress Concern Present  . Feeling of Stress : Not at all  Social Connections: Socially Integrated  . Frequency of Communication with Friends and Family: More than three times  a week  . Frequency of Social Gatherings with Friends and Family: More than three times a week  . Attends Religious Services: More than 4 times per year  . Active Member of Clubs or Organizations: Yes  . Attends Archivist Meetings: More than 4 times per year  . Marital Status: Married  Human resources officer Violence: Not At Risk  . Fear of Current or Ex-Partner: No  . Emotionally Abused: No  . Physically Abused: No  . Sexually Abused: No    FAMILY HISTORY: Family History  Problem Relation Age of Onset  . Heart disease Father   . CVA Mother   . Lung cancer Sister   . Lung cancer Brother     ALLERGIES:  has No Known Allergies.  MEDICATIONS:  Current Outpatient Medications  Medication Sig Dispense Refill  . acetaminophen (TYLENOL) 500 MG tablet Take 500-1,000 mg by mouth every 6 (six) hours as needed for mild pain or fever.     Marland Kitchen albuterol (VENTOLIN HFA) 108 (90 Base) MCG/ACT inhaler Inhale 2 puffs into the lungs every 4 (four) hours as needed for wheezing or shortness  of breath. 18 g 0  . Calcium Carb-Cholecalciferol (OYSTER SHELL CALCIUM) 500-400 MG-UNIT TABS Take 500 tablets by mouth.    . escitalopram (LEXAPRO) 5 MG tablet Take 1 tablet (5 mg total) by mouth daily. am's 90 tablet 0  . Fluticasone-Umeclidin-Vilant (TRELEGY ELLIPTA) 100-62.5-25 MCG/INH AEPB Inhale 1 puff into the lungs daily. 60 each 2  . folic acid (FOLVITE) 1 MG tablet Take 1 tablet (1 mg total) by mouth daily. 90 tablet 1  . lidocaine-prilocaine (EMLA) cream Apply 1 application topically as needed. Apply small amount to port site at least 1 hour prior to it being accessed, cover with plastic wrap 30 g 1  . Multiple Vitamins-Minerals (MULTIVITAMIN GUMMIES MENS PO) Take 1 Dose by mouth daily.     . ondansetron (ZOFRAN) 4 MG tablet Take 1 tablet (4 mg total) by mouth every 8 (eight) hours as needed for nausea or vomiting. 20 tablet 2  . prochlorperazine (COMPAZINE) 10 MG tablet Take 1 tablet (10 mg total) by  mouth every 6 (six) hours as needed for nausea or vomiting. 40 tablet 1  . QUEtiapine (SEROQUEL) 25 MG tablet Take 1 tablet (25 mg total) by mouth at bedtime. 90 tablet 1   No current facility-administered medications for this visit.      Marland Kitchen  PHYSICAL EXAMINATION: ECOG PERFORMANCE STATUS: 3 - Symptomatic, >50% confined to bed  Vitals:   03/14/20 0842  BP: 102/69  Pulse: 70  Resp: 18  Temp: (!) 97 F (36.1 C)  SpO2: 100%   Filed Weights   03/14/20 0842  Weight: 126 lb (57.2 kg)    Physical Exam Constitutional:      Comments: Thin built frail appearing male patient.  No acute distress.  HEENT negative.  Accompanied by his daughter  HENT:     Head: Normocephalic and atraumatic.     Mouth/Throat:     Pharynx: No oropharyngeal exudate.  Eyes:     Pupils: Pupils are equal, round, and reactive to light.  Cardiovascular:     Rate and Rhythm: Normal rate and regular rhythm.  Pulmonary:     Effort: No respiratory distress.     Breath sounds: No wheezing.     Comments: Decreased air entry bilaterally. Abdominal:     General: Bowel sounds are normal. There is no distension.     Palpations: Abdomen is soft. There is no mass.     Tenderness: There is no abdominal tenderness. There is no guarding or rebound.  Musculoskeletal:        General: No tenderness. Normal range of motion.     Cervical back: Normal range of motion and neck supple.  Skin:    General: Skin is warm.  Neurological:     Mental Status: He is alert and oriented to person, place, and time.  Psychiatric:        Mood and Affect: Affect normal.      LABORATORY DATA:  I have reviewed the data as listed Lab Results  Component Value Date   WBC 4.7 03/14/2020   HGB 13.2 03/14/2020   HCT 40.3 03/14/2020   MCV 86.1 03/14/2020   PLT 198 03/14/2020   Recent Labs    02/01/20 0837 02/01/20 0837 02/10/20 0822 02/22/20 0831 03/14/20 0831  NA 137   < > 136 137 139  K 4.0   < > 4.1 4.0 3.9  CL 97*   < > 97*  100 103  CO2 30   < > 28 31 29  GLUCOSE 141*   < > 164* 117* 104*  BUN 10   < > 16 16 19   CREATININE 0.54*   < > 0.67 0.59* 0.56*  CALCIUM 9.2   < > 8.8* 8.7* 8.8*  GFRNONAA >60   < > >60 >60 >60  GFRAA >60   < > >60 >60 >60  PROT 7.4  --   --  6.7 6.9  ALBUMIN 3.4*  --   --  3.7 3.8  AST 32  --   --  23 24  ALT 26  --   --  20 20  ALKPHOS 159*  --   --  94 82  BILITOT 0.3  --   --  0.5 0.5   < > = values in this interval not displayed.    RADIOGRAPHIC STUDIES: I have personally reviewed the radiological images as listed and agreed with the findings in the report. CT Chest W Contrast  Result Date: 02/20/2020 CLINICAL DATA:  Follow-up right lung cancer EXAM: CT CHEST WITH CONTRAST TECHNIQUE: Multidetector CT imaging of the chest was performed during intravenous contrast administration. CONTRAST:  40m OMNIPAQUE IOHEXOL 300 MG/ML  SOLN COMPARISON:  PET-CT dated 12/06/2019. CTA chest dated 11/29/2019. CT chest abdomen pelvis dated 10/31/2019. FINDINGS: Cardiovascular: The heart is normal in size. No pericardial effusion. No evidence of thoracic aneurysm. Atherosclerotic calcifications of the aortic arch. Coronary atherosclerosis of the LAD. Mediastinum/Nodes: No suspicious mediastinal lymphadenopathy. Visualized thyroid is unremarkable. Lungs/Pleura: 2.2 x 1.9 cm spiculated nodule in the anterior right upper lobe, previously 2.4 x 2.0 cm on prior CT chest dated 11/29/2019. Associated pleural-based opacity beneath the anterior right hemithorax (series 2/image 43), similar. Suspected involvement of the overlying right anterior 1st/2nd and posterior 3rd/4th ribs, unchanged. Severe centrilobular and paraseptal emphysematous changes, upper lung predominant. No focal consolidation. No pleural effusion or pneumothorax. Upper Abdomen: Visualized upper abdomen is notable for at least 6 hepatic metastases, including a dominant 2.5 cm mass in the left hepatic dome (series 2/image 147), improved from April  2021. Musculoskeletal: Severe pathologic compression fracture deformity at T3. Mild superior endplate pathologic compression fracture deformity at T4. Status post T1-6 posterior fixation hardware with associated streak artifact, obscuring evaluation, although residual soft tissue in the right paraspinal region measuring 2.5 x 3.4 cm is suspected at the T3-4 level (series 3/image 34), previously 2.5 x 4.8 cm. Mild degenerative changes of the visualized thoracolumbar spine. IMPRESSION: 2.2 cm spiculated nodule in the anterior right upper lobe, corresponding to known primary bronchogenic neoplasm, possibly mildly improved. Associated pleural-based opacity with involvement of the right upper chest wall/ribs, as described above, grossly unchanged. Osseous destruction involving the L3-4 vertebral bodies, status post T1-6 posterior fixation. Associated 2.5 x 3.4 cm right paraspinal soft tissue mass, improved. Multifocal hepatic metastases, improved. Aortic Atherosclerosis (ICD10-I70.0) and Emphysema (ICD10-J43.9). Electronically Signed   By: SJulian HyM.D.   On: 02/20/2020 17:01   IR IMAGING GUIDED PORT INSERTION  Result Date: 02/24/2020 CLINICAL DATA:  Stage IV adenocarcinoma of the lung and poor intravenous access. Port-A-Cath placement requested for continued chemotherapy administration and blood draws. EXAM: IMPLANTED PORT A CATH PLACEMENT WITH ULTRASOUND AND FLUOROSCOPIC GUIDANCE ANESTHESIA/SEDATION: 1.0 mg IV Versed; 50 mcg IV Fentanyl Total Moderate Sedation Time:  35 minutes The patient's level of consciousness and physiologic status were continuously monitored during the procedure by Radiology nursing. Additional Medications: 2 g IV Ancef. FLUOROSCOPY TIME:  24 seconds.  2.1 mGy. PROCEDURE: The procedure, risks, benefits, and alternatives were explained to the  patient. Questions regarding the procedure were encouraged and answered. The patient understands and consents to the procedure. A time-out was  performed prior to initiating the procedure. Ultrasound was utilized to confirm patency of the right internal jugular vein. The right neck and chest were prepped with chlorhexidine in a sterile fashion, and a sterile drape was applied covering the operative field. Maximum barrier sterile technique with sterile gowns and gloves were used for the procedure. Local anesthesia was provided with 1% lidocaine. After creating a small venotomy incision, a 21 gauge needle was advanced into the right internal jugular vein under direct, real-time ultrasound guidance. Ultrasound image documentation was performed. After securing guidewire access, an 8 Fr dilator was placed. A J-wire was kinked to measure appropriate catheter length. A subcutaneous port pocket was then created along the upper chest wall utilizing sharp and blunt dissection. Portable cautery was utilized. The pocket was irrigated with sterile saline. A single lumen power injectable port was chosen for placement. The 8 Fr catheter was tunneled from the port pocket site to the venotomy incision. The port was placed in the pocket. External catheter was trimmed to appropriate length based on guidewire measurement. At the venotomy, an 8 Fr peel-away sheath was placed over a guidewire. The catheter was then placed through the sheath and the sheath removed. Final catheter positioning was confirmed and documented with a fluoroscopic spot image. The port was accessed with a needle and aspirated and flushed with heparinized saline. The access needle was removed. The venotomy and port pocket incisions were closed with subcutaneous 3-0 Monocryl and subcuticular 4-0 Vicryl. Dermabond was applied to both incisions. COMPLICATIONS: COMPLICATIONS None FINDINGS: After catheter placement, the tip lies at the cavo-atrial junction. The catheter aspirates normally and is ready for immediate use. IMPRESSION: Placement of single lumen port a cath via right internal jugular vein. The  catheter tip lies at the cavo-atrial junction. A power injectable port a cath was placed and is ready for immediate use. Electronically Signed   By: Aletta Edouard M.D.   On: 02/24/2020 09:40    ASSESSMENT & PLAN:   Cancer of upper lobe of right lung ([address]) # Stage IV metastatic non-small cell lung cancer favor adeno; mets to bone; liver; NGS pending.  PET scan May 2021-shows multiple liver lesions; lung lesions bone lesions. Currently on Keytruda+Carbo [AUC-4]+Alimta. CT scan July 27th-partial response [stable lung lesion; improved para spinal mass; improvement of the liver lesions]  # proceed with carbo-alimta-keytruda #3  Labs today reviewed;  acceptable for treatment today. Discussed that would recommend total of 4 cycles of carbo Alimta Keytruda; follow-up imaging. Recommend maintenance Keytruda.  #Bone metastases- on zometa 3 mg IVPB.  Every 6 weeks. Ca-8.8; on  ca+ vit D BID  #Malnutrition/cachexia-continue weight loss/underlying malignancy.  S/p nutrition evaluation- STABLE.   # COPD- stable-Trelegy.  # Insomnia-continue Seroquel.stable.   #Debility-spinal cord compression/status post surgery-on physical therapy-stable  # DISPOSITION: # Chemo today; Zometa  # follow up in 3 weeks-  MD;labs- cbc/cmp; carbo-alimta- Keytruda; Dr.B   All questions were answered. The patient knows to call the clinic with any problems, questions or concerns.    Cammie Sickle, MD 03/14/2020 1:34 PM

## 2020-03-14 NOTE — Progress Notes (Signed)
[place]  [phone] [phone]   Name: Cody [name] Date: 03/14/2020 MRN: [id number]  DOB: 11-03-1955  Patient Care Team: Steele Sizer, MD as PCP - General (Family Medicine) Christene Lye, MD (General Surgery) Telford Nab, RN as Oncology Nurse Navigator    REASON FOR CONSULTATION: Cody [name] is a 64 y.o. 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  Patient was hospitalized from 11/29/2019 to 11/30/2019 with hypoxic respiratory failure.  He was discharged home on O2.  Patient is pending initiation of RT.  He was referred to palliative care to help address goals and manage ongoing symptoms.  SOCIAL HISTORY:     reports that he quit smoking about 8 years ago. He has a 60.00 pack-year smoking history. He has never used smokeless tobacco. He reports current alcohol use of about 5.0 standard drinks of alcohol per week. He reports that he does not use drugs.   Patient is married and lives at home with his wife.  He has 3 daughters who live in [place] and a son in [place].  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  ADVANCE DIRECTIVES:  Does not have  CODE STATUS:   PAST MEDICAL HISTORY: Past Medical History:  Diagnosis Date  . Allergy   . COPD (chronic obstructive pulmonary disease) ([place])   . Prostate enlargement     PAST SURGICAL HISTORY:  Past Surgical History:  Procedure Laterality Date  . HERNIA REPAIR Bilateral 1982  . HERNIA REPAIR Right 1994  . IR IMAGING GUIDED PORT INSERTION  02/24/2020  . LAMINECTOMY FOR EXCISION / EVACUATION INTRASPINAL LESION  11/07/2019   T1-T 6 at [facility]     HEMATOLOGY/ONCOLOGY  HISTORY:  Oncology History Overview Note  # April 2021- RUL lung cancer; liver met; spinal cord compression/ vertebral mets [[facility]]; [[place]]; LIVER Bx- Metastatic adenocarcinoma, consistent with lung primary.- TPS % Interpretation -PD-L1 IHC 10 LOW EXPRESSION POSITIVE   # 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   # MAY 26th-Keytrda x2 cycles; [borderline PS] July 7th-carbo Alimta Keytruda cycle #1  # NGS/MOLECULAR TESTS:   # PALLIATIVE CARE EVALUATION:  # PAIN MANAGEMENT:    DIAGNOSIS:   STAGE:         ;  GOALS:  CURRENT/MOST RECENT THERAPY :     Cancer of upper lobe of right lung ([place])  11/23/2019 Initial Diagnosis   Cancer of upper lobe of right lung ([place])   12/21/2019 -  Chemotherapy   The patient had dexamethasone (DECADRON) 4 MG tablet, 1 of 1 cycle, Start date: --, End date: -- palonosetron (ALOXI) injection 0.25 mg, 0.25 mg, Intravenous,  Once, 3 of 4 cycles Administration: 0.25 mg (02/01/2020), 0.25 mg (02/22/2020) PEMEtrexed (ALIMTA) 800 mg in sodium chloride 0.9 % 100 mL chemo infusion, 500 mg/m2 = 800 mg, Intravenous,  Once, 3 of 4 cycles Administration: 800 mg (02/01/2020), 800 mg (02/22/2020) CARBOplatin (PARAPLATIN) 330 mg in sodium chloride 0.9 % 250 mL chemo infusion, 330 mg (100 % of original dose 328.8 mg), Intravenous,  Once, 3 of 4 cycles Dose modification:   (original dose 328.8 mg, Cycle 3),   (  original dose 328.8 mg, Cycle 5) Administration: 330 mg (02/01/2020), 330 mg (02/22/2020) fosaprepitant (EMEND) 150 mg in sodium chloride 0.9 % 145 mL IVPB, 150 mg, Intravenous,  Once, 3 of 4 cycles Administration: 150 mg (02/01/2020), 150 mg (02/22/2020) pembrolizumab (KEYTRUDA) 200 mg in sodium chloride 0.9 % 50 mL chemo infusion, 200 mg, Intravenous, Once, 5 of 6 cycles Administration: 200 mg (12/21/2019), 200 mg (02/01/2020), 200 mg (02/22/2020), 200 mg (01/11/2020)  for  chemotherapy treatment.      ALLERGIES:  has No Known Allergies.  MEDICATIONS:  Current Outpatient Medications  Medication Sig Dispense Refill  . acetaminophen (TYLENOL) 500 MG tablet Take 500-1,000 mg by mouth every 6 (six) hours as needed for mild pain or fever.     Marland Kitchen albuterol (VENTOLIN HFA) 108 (90 Base) MCG/ACT inhaler Inhale 2 puffs into the lungs every 4 (four) hours as needed for wheezing or shortness of breath. 18 g 0  . Calcium Carb-Cholecalciferol (OYSTER SHELL CALCIUM) 500-400 MG-UNIT TABS Take 500 tablets by mouth.    . escitalopram (LEXAPRO) 5 MG tablet Take 1 tablet (5 mg total) by mouth daily. am's 90 tablet 0  . Fluticasone-Umeclidin-Vilant (TRELEGY ELLIPTA) 100-62.5-25 MCG/INH AEPB Inhale 1 puff into the lungs daily. 60 each 2  . folic acid (FOLVITE) 1 MG tablet Take 1 tablet (1 mg total) by mouth daily. 90 tablet 1  . lidocaine-prilocaine (EMLA) cream Apply 1 application topically as needed. Apply small amount to port site at least 1 hour prior to it being accessed, cover with plastic wrap 30 g 1  . Multiple Vitamins-Minerals (MULTIVITAMIN GUMMIES MENS PO) Take 1 Dose by mouth daily.     . ondansetron (ZOFRAN) 4 MG tablet Take 1 tablet (4 mg total) by mouth every 8 (eight) hours as needed for nausea or vomiting. 20 tablet 2  . prochlorperazine (COMPAZINE) 10 MG tablet Take 1 tablet (10 mg total) by mouth every 6 (six) hours as needed for nausea or vomiting. 40 tablet 1  . QUEtiapine (SEROQUEL) 25 MG tablet Take 1 tablet (25 mg total) by mouth at bedtime. 90 tablet 1   No current facility-administered medications for this visit.   Facility-Administered Medications Ordered in Other Visits  Medication Dose Route Frequency Provider Last Rate Last Admin  . CARBOplatin (PARAPLATIN) 330 mg in sodium chloride 0.9 % 250 mL chemo infusion  330 mg Intravenous Once Charlaine Dalton R, MD      . fosaprepitant (EMEND) 150 mg in sodium chloride 0.9 % 145 mL IVPB  150 mg Intravenous  Once Charlaine Dalton R, MD 450 mL/hr at 03/14/20 1000 150 mg at 03/14/20 1000  . pembrolizumab (KEYTRUDA) 200 mg in sodium chloride 0.9 % 50 mL chemo infusion  200 mg Intravenous Once Charlaine Dalton R, MD      . PEMEtrexed (ALIMTA) 800 mg in sodium chloride 0.9 % 100 mL chemo infusion  500 mg/m2 (Treatment Plan Recorded) Intravenous Once Cammie Sickle, MD      . zolendronic acid (ZOMETA) 3 mg in sodium chloride 0.9 % 100 mL IVPB  3 mg Intravenous Once Cammie Sickle, MD        VITAL SIGNS: There were no vitals taken for this visit. There were no vitals filed for this visit.  Estimated body mass index is 18.61 kg/m as calculated from the following:   Height as of an earlier encounter on 03/14/20: 5' 9"  (1.753 m).   Weight as of an earlier encounter on 03/14/20: 126 lb (57.2 kg).  LABS:  CBC:    Component Value Date/Time   WBC 4.7 03/14/2020 0831   HGB 13.2 03/14/2020 0831   HGB 15.2 10/21/2019 1517   HCT 40.3 03/14/2020 0831   HCT 43.0 10/21/2019 1517   PLT 198 03/14/2020 0831   PLT 325 10/21/2019 1517   MCV 86.1 03/14/2020 0831   MCV 86 10/21/2019 1517   NEUTROABS 3.0 03/14/2020 0831   NEUTROABS 5.9 10/21/2019 1517   LYMPHSABS 0.8 03/14/2020 0831   LYMPHSABS 1.3 10/21/2019 1517   MONOABS 0.6 03/14/2020 0831   EOSABS 0.2 03/14/2020 0831   EOSABS 0.0 10/21/2019 1517   BASOSABS 0.0 03/14/2020 0831   BASOSABS 0.1 10/21/2019 1517   Comprehensive Metabolic Panel:    Component Value Date/Time   NA 139 03/14/2020 0831   NA 135 10/21/2019 1517   K 3.9 03/14/2020 0831   CL 103 03/14/2020 0831   CO2 29 03/14/2020 0831   BUN 19 03/14/2020 0831   BUN 14 10/21/2019 1517   CREATININE 0.56 (L) 03/14/2020 0831   CREATININE 0.87 03/16/2017 1211   GLUCOSE 104 (H) 03/14/2020 0831   CALCIUM 8.8 (L) 03/14/2020 0831   AST 24 03/14/2020 0831   ALT 20 03/14/2020 0831   ALKPHOS 82 03/14/2020 0831   BILITOT 0.5 03/14/2020 0831   BILITOT 0.5 10/21/2019 1517   PROT 6.9  03/14/2020 0831   PROT 7.0 10/21/2019 1517   ALBUMIN 3.8 03/14/2020 0831   ALBUMIN 3.6 (L) 10/21/2019 1517    RADIOGRAPHIC STUDIES: CT Chest W Contrast  Result Date: 02/20/2020 CLINICAL DATA:  Follow-up right lung cancer EXAM: CT CHEST WITH CONTRAST TECHNIQUE: Multidetector CT imaging of the chest was performed during intravenous contrast administration. CONTRAST:  55m OMNIPAQUE IOHEXOL 300 MG/ML  SOLN COMPARISON:  PET-CT dated 12/06/2019. CTA chest dated 11/29/2019. CT chest abdomen pelvis dated 10/31/2019. FINDINGS: Cardiovascular: The heart is normal in size. No pericardial effusion. No evidence of thoracic aneurysm. Atherosclerotic calcifications of the aortic arch. Coronary atherosclerosis of the LAD. Mediastinum/Nodes: No suspicious mediastinal lymphadenopathy. Visualized thyroid is unremarkable. Lungs/Pleura: 2.2 x 1.9 cm spiculated nodule in the anterior right upper lobe, previously 2.4 x 2.0 cm on prior CT chest dated 11/29/2019. Associated pleural-based opacity beneath the anterior right hemithorax (series 2/image 43), similar. Suspected involvement of the overlying right anterior 1st/2nd and posterior 3rd/4th ribs, unchanged. Severe centrilobular and paraseptal emphysematous changes, upper lung predominant. No focal consolidation. No pleural effusion or pneumothorax. Upper Abdomen: Visualized upper abdomen is notable for at least 6 hepatic metastases, including a dominant 2.5 cm mass in the left hepatic dome (series 2/image 147), improved from April 2021. Musculoskeletal: Severe pathologic compression fracture deformity at T3. Mild superior endplate pathologic compression fracture deformity at T4. Status post T1-6 posterior fixation hardware with associated streak artifact, obscuring evaluation, although residual soft tissue in the right paraspinal region measuring 2.5 x 3.4 cm is suspected at the T3-4 level (series 3/image 34), previously 2.5 x 4.8 cm. Mild degenerative changes of the  visualized thoracolumbar spine. IMPRESSION: 2.2 cm spiculated nodule in the anterior right upper lobe, corresponding to known primary bronchogenic neoplasm, possibly mildly improved. Associated pleural-based opacity with involvement of the right upper chest wall/ribs, as described above, grossly unchanged. Osseous destruction involving the L3-4 vertebral bodies, status post T1-6 posterior fixation. Associated 2.5 x 3.4 cm right paraspinal soft tissue mass, improved. Multifocal hepatic metastases, improved. Aortic Atherosclerosis (ICD10-I70.0) and Emphysema (ICD10-J43.9). Electronically Signed   By: SJulian HyM.D.   On: 02/20/2020 17:01   IR IMAGING GUIDED  PORT INSERTION  Result Date: 02/24/2020 CLINICAL DATA:  Stage IV adenocarcinoma of the lung and poor intravenous access. Port-A-Cath placement requested for continued chemotherapy administration and blood draws. EXAM: IMPLANTED PORT A CATH PLACEMENT WITH ULTRASOUND AND FLUOROSCOPIC GUIDANCE ANESTHESIA/SEDATION: 1.0 mg IV Versed; 50 mcg IV Fentanyl Total Moderate Sedation Time:  35 minutes The patient's level of consciousness and physiologic status were continuously monitored during the procedure by Radiology nursing. Additional Medications: 2 g IV Ancef. FLUOROSCOPY TIME:  24 seconds.  2.1 mGy. PROCEDURE: The procedure, risks, benefits, and alternatives were explained to the patient. Questions regarding the procedure were encouraged and answered. The patient understands and consents to the procedure. A time-out was performed prior to initiating the procedure. Ultrasound was utilized to confirm patency of the right internal jugular vein. The right neck and chest were prepped with chlorhexidine in a sterile fashion, and a sterile drape was applied covering the operative field. Maximum barrier sterile technique with sterile gowns and gloves were used for the procedure. Local anesthesia was provided with 1% lidocaine. After creating a small venotomy  incision, a 21 gauge needle was advanced into the right internal jugular vein under direct, real-time ultrasound guidance. Ultrasound image documentation was performed. After securing guidewire access, an 8 Fr dilator was placed. A J-wire was kinked to measure appropriate catheter length. A subcutaneous port pocket was then created along the upper chest wall utilizing sharp and blunt dissection. Portable cautery was utilized. The pocket was irrigated with sterile saline. A single lumen power injectable port was chosen for placement. The 8 Fr catheter was tunneled from the port pocket site to the venotomy incision. The port was placed in the pocket. External catheter was trimmed to appropriate length based on guidewire measurement. At the venotomy, an 8 Fr peel-away sheath was placed over a guidewire. The catheter was then placed through the sheath and the sheath removed. Final catheter positioning was confirmed and documented with a fluoroscopic spot image. The port was accessed with a needle and aspirated and flushed with heparinized saline. The access needle was removed. The venotomy and port pocket incisions were closed with subcutaneous 3-0 Monocryl and subcuticular 4-0 Vicryl. Dermabond was applied to both incisions. COMPLICATIONS: COMPLICATIONS None FINDINGS: After catheter placement, the tip lies at the cavo-atrial junction. The catheter aspirates normally and is ready for immediate use. IMPRESSION: Placement of single lumen port a cath via right internal jugular vein. The catheter tip lies at the cavo-atrial junction. A power injectable port a cath was placed and is ready for immediate use. Electronically Signed   By: Aletta Edouard M.D.   On: 02/24/2020 09:40    PERFORMANCE STATUS (ECOG) : 2 - Symptomatic, <50% confined to bed  Review of Systems Unless otherwise noted, a complete review of systems is negative.  Physical Exam General: NAD, frail appearing, in wheelchair Pulmonary: Unlabored, on  O2 Extremities: no edema, no joint deformities Skin: no rashes Neurological: Weakness but otherwise nonfocal  IMPRESSION: Routine follow-up visit.  Patient seen in the infusion area.  Patient reports that he is doing better.  He continues to work with home health PT/OT and feels like his strength is improving.  He is ambulating with use of a walker at home.  He denies falls.  He says he is mostly independent with his ADLs.  His children are staying with him less frequently.  Patient denies any symptomatic complaints today.  He reports good oral intake.  No issues with medications.  Patient says that he thinks  he discussed the MOST form with his daughters.  I requested that he bring it into the clinic so that we can review and complete it if he is interested.  PLAN: -Continue current scope of treatment -ACP/MOST form previously reviewed -RTC in 3 weeks   Patient expressed understanding and was in agreement with this plan. He also understands that He can call the clinic at any time with any questions, concerns, or complaints.     Time Total: 15 minutes  Visit consisted of counseling and education dealing with the complex and emotionally intense issues of symptom management and palliative care in the setting of serious and potentially life-threatening illness.Greater than 50%  of this time was spent counseling and coordinating care related to the above assessment and plan.  Signed by: Altha Harm, PhD, NP-C

## 2020-03-14 NOTE — Assessment & Plan Note (Addendum)
#   Stage IV metastatic non-small cell lung cancer favor adeno; mets to bone; liver; NGS pending.  PET scan May 2021-shows multiple liver lesions; lung lesions bone lesions. Currently on Keytruda+Carbo [AUC-4]+Alimta. CT scan July 27th-partial response [stable lung lesion; improved para spinal mass; improvement of the liver lesions]  # proceed with carbo-alimta-keytruda #3  Labs today reviewed;  acceptable for treatment today. Discussed that would recommend total of 4 cycles of carbo Alimta Keytruda; follow-up imaging. Recommend maintenance Keytruda.  #Bone metastases- on zometa 3 mg IVPB.  Every 6 weeks. Ca-8.8; on  ca+ vit D BID  #Malnutrition/cachexia-continue weight loss/underlying malignancy.  S/p nutrition evaluation- STABLE.   # COPD- stable-Trelegy.  # Insomnia-continue Seroquel.stable.   #Debility-spinal cord compression/status post surgery-on physical therapy-stable  # DISPOSITION: # Chemo today; Zometa  # follow up in 3 weeks-  MD;labs- cbc/cmp; carbo-alimta- Keytruda; Dr.B

## 2020-03-22 DIAGNOSIS — R531 Weakness: Secondary | ICD-10-CM | POA: Diagnosis not present

## 2020-03-26 ENCOUNTER — Inpatient Hospital Stay: Payer: Medicare HMO

## 2020-03-26 DIAGNOSIS — R531 Weakness: Secondary | ICD-10-CM | POA: Diagnosis not present

## 2020-03-26 NOTE — Progress Notes (Signed)
Nutrition Follow-up:  Patient with lung cancer, liver mets, spinal cord compression/vertebral mets.  Patient receiving carboplatin, alimta, keytruda.  Spoke with patient via phone for nutrition follow-up.  Patient reports that appetite is good.  Has been able to tolerate cheeseburger recently that did not upset stomach.  Reports that he is eating 3 meals per day and eating snacks. Drinks ensure enlive as well.      Medications: reviewed  Labs: reviewed  Anthropometrics:   Weight 126 lb 8/18 increased from 124 lb on 7/16.  118 lb on 6/16 115 lb on 6/3   NUTRITION DIAGNOSIS:  Inadequate oral intake improving   INTERVENTION:  Will leave another case of ensure enlive at registration for patient to pick up.   Continue high calorie, high protein diet to prevent weight loss.  Patient has contact information    MONITORING, EVALUATION, GOAL: weight trends, intake   NEXT VISIT: October 11 phone f/u  Millette Halberstam B. Zenia Resides, [place], [place] Registered Dietitian [phone] (mobile)

## 2020-04-01 DIAGNOSIS — J9601 Acute respiratory failure with hypoxia: Secondary | ICD-10-CM | POA: Diagnosis not present

## 2020-04-01 DIAGNOSIS — J441 Chronic obstructive pulmonary disease with (acute) exacerbation: Secondary | ICD-10-CM | POA: Diagnosis not present

## 2020-04-03 ENCOUNTER — Other Ambulatory Visit: Payer: Self-pay

## 2020-04-03 ENCOUNTER — Encounter: Payer: Self-pay | Admitting: Internal Medicine

## 2020-04-04 ENCOUNTER — Inpatient Hospital Stay: Payer: Medicare HMO

## 2020-04-04 ENCOUNTER — Inpatient Hospital Stay: Payer: Medicare HMO | Attending: Internal Medicine

## 2020-04-04 ENCOUNTER — Inpatient Hospital Stay (HOSPITAL_BASED_OUTPATIENT_CLINIC_OR_DEPARTMENT_OTHER): Payer: Medicare HMO | Admitting: Internal Medicine

## 2020-04-04 ENCOUNTER — Inpatient Hospital Stay (HOSPITAL_BASED_OUTPATIENT_CLINIC_OR_DEPARTMENT_OTHER): Payer: Medicare HMO | Admitting: Hospice and Palliative Medicine

## 2020-04-04 DIAGNOSIS — Z79899 Other long term (current) drug therapy: Secondary | ICD-10-CM | POA: Diagnosis not present

## 2020-04-04 DIAGNOSIS — R5381 Other malaise: Secondary | ICD-10-CM | POA: Insufficient documentation

## 2020-04-04 DIAGNOSIS — G9520 Unspecified cord compression: Secondary | ICD-10-CM | POA: Diagnosis not present

## 2020-04-04 DIAGNOSIS — C787 Secondary malignant neoplasm of liver and intrahepatic bile duct: Secondary | ICD-10-CM | POA: Insufficient documentation

## 2020-04-04 DIAGNOSIS — C3411 Malignant neoplasm of upper lobe, right bronchus or lung: Secondary | ICD-10-CM

## 2020-04-04 DIAGNOSIS — N400 Enlarged prostate without lower urinary tract symptoms: Secondary | ICD-10-CM | POA: Diagnosis not present

## 2020-04-04 DIAGNOSIS — R5383 Other fatigue: Secondary | ICD-10-CM | POA: Insufficient documentation

## 2020-04-04 DIAGNOSIS — C3410 Malignant neoplasm of upper lobe, unspecified bronchus or lung: Secondary | ICD-10-CM

## 2020-04-04 DIAGNOSIS — R64 Cachexia: Secondary | ICD-10-CM | POA: Diagnosis not present

## 2020-04-04 DIAGNOSIS — Z7189 Other specified counseling: Secondary | ICD-10-CM

## 2020-04-04 DIAGNOSIS — Z7289 Other problems related to lifestyle: Secondary | ICD-10-CM | POA: Insufficient documentation

## 2020-04-04 DIAGNOSIS — E46 Unspecified protein-calorie malnutrition: Secondary | ICD-10-CM | POA: Diagnosis not present

## 2020-04-04 DIAGNOSIS — M549 Dorsalgia, unspecified: Secondary | ICD-10-CM | POA: Diagnosis not present

## 2020-04-04 DIAGNOSIS — Z8249 Family history of ischemic heart disease and other diseases of the circulatory system: Secondary | ICD-10-CM | POA: Diagnosis not present

## 2020-04-04 DIAGNOSIS — Z801 Family history of malignant neoplasm of trachea, bronchus and lung: Secondary | ICD-10-CM | POA: Diagnosis not present

## 2020-04-04 DIAGNOSIS — Z515 Encounter for palliative care: Secondary | ICD-10-CM | POA: Diagnosis not present

## 2020-04-04 DIAGNOSIS — J00 Acute nasopharyngitis [common cold]: Secondary | ICD-10-CM | POA: Diagnosis not present

## 2020-04-04 DIAGNOSIS — R0981 Nasal congestion: Secondary | ICD-10-CM | POA: Diagnosis not present

## 2020-04-04 DIAGNOSIS — M2550 Pain in unspecified joint: Secondary | ICD-10-CM | POA: Insufficient documentation

## 2020-04-04 DIAGNOSIS — J449 Chronic obstructive pulmonary disease, unspecified: Secondary | ICD-10-CM | POA: Insufficient documentation

## 2020-04-04 DIAGNOSIS — Z5112 Encounter for antineoplastic immunotherapy: Secondary | ICD-10-CM | POA: Insufficient documentation

## 2020-04-04 DIAGNOSIS — C7951 Secondary malignant neoplasm of bone: Secondary | ICD-10-CM | POA: Diagnosis not present

## 2020-04-04 DIAGNOSIS — Z5111 Encounter for antineoplastic chemotherapy: Secondary | ICD-10-CM | POA: Insufficient documentation

## 2020-04-04 DIAGNOSIS — G4700 Insomnia, unspecified: Secondary | ICD-10-CM | POA: Insufficient documentation

## 2020-04-04 DIAGNOSIS — Z87891 Personal history of nicotine dependence: Secondary | ICD-10-CM | POA: Insufficient documentation

## 2020-04-04 LAB — COMPREHENSIVE METABOLIC PANEL
ALT: 20 U/L (ref 0–44)
AST: 29 U/L (ref 15–41)
Albumin: 3.7 g/dL (ref 3.5–5.0)
Alkaline Phosphatase: 77 U/L (ref 38–126)
Anion gap: 10 (ref 5–15)
BUN: 19 mg/dL (ref 8–23)
CO2: 28 mmol/L (ref 22–32)
Calcium: 8.8 mg/dL — ABNORMAL LOW (ref 8.9–10.3)
Chloride: 99 mmol/L (ref 98–111)
Creatinine, Ser: 0.61 mg/dL (ref 0.61–1.24)
GFR calc Af Amer: >60 mL/min (ref >60)
GFR calc non Af Amer: >60 mL/min (ref >60)
Glucose, Bld: 181 mg/dL — ABNORMAL HIGH (ref 70–99)
Potassium: 4.3 mmol/L (ref 3.5–5.1)
Sodium: 137 mmol/L (ref 135–145)
Total Bilirubin: 0.6 mg/dL (ref 0.3–1.2)
Total Protein: 6.7 g/dL (ref 6.5–8.1)

## 2020-04-04 LAB — CBC WITH DIFFERENTIAL/PLATELET
Abs Immature Granulocytes: 0.02 10*3/uL (ref 0.00–0.07)
Basophils Absolute: 0 10*3/uL (ref 0.0–0.1)
Basophils Relative: 1 %
Eosinophils Absolute: 0.2 10*3/uL (ref 0.0–0.5)
Eosinophils Relative: 4 %
HCT: 39.7 % (ref 39.0–52.0)
Hemoglobin: 13.5 g/dL (ref 13.0–17.0)
Immature Granulocytes: 0 %
Lymphocytes Relative: 18 %
Lymphs Abs: 0.8 10*3/uL (ref 0.7–4.0)
MCH: 30 pg (ref 26.0–34.0)
MCHC: 34 g/dL (ref 30.0–36.0)
MCV: 88.2 fL (ref 80.0–100.0)
Monocytes Absolute: 0.5 10*3/uL (ref 0.1–1.0)
Monocytes Relative: 11 %
Neutro Abs: 3 10*3/uL (ref 1.7–7.7)
Neutrophils Relative %: 66 %
Platelets: 188 10*3/uL (ref 150–400)
RBC: 4.5 MIL/uL (ref 4.22–5.81)
RDW: 16.7 % — ABNORMAL HIGH (ref 11.5–15.5)
WBC: 4.5 10*3/uL (ref 4.0–10.5)
nRBC: 0 % (ref 0.0–0.2)

## 2020-04-04 MED ORDER — SODIUM CHLORIDE 0.9 % IV SOLN
150.0000 mg | Freq: Once | INTRAVENOUS | Status: AC
  Administered 2020-04-04: 150 mg via INTRAVENOUS
  Filled 2020-04-04: qty 150

## 2020-04-04 MED ORDER — HEPARIN SOD (PORK) LOCK FLUSH 100 UNIT/ML IV SOLN
500.0000 [IU] | Freq: Once | INTRAVENOUS | Status: AC
  Administered 2020-04-04: 500 [IU] via INTRAVENOUS
  Filled 2020-04-04: qty 5

## 2020-04-04 MED ORDER — DEXAMETHASONE SODIUM PHOSPHATE 10 MG/ML IJ SOLN
10.0000 mg | Freq: Once | INTRAMUSCULAR | Status: AC
  Administered 2020-04-04: 10 mg via INTRAVENOUS
  Filled 2020-04-04: qty 1

## 2020-04-04 MED ORDER — CYANOCOBALAMIN 1000 MCG/ML IJ SOLN
1000.0000 ug | Freq: Once | INTRAMUSCULAR | Status: AC
  Administered 2020-04-04: 1000 ug via INTRAMUSCULAR
  Filled 2020-04-04: qty 1

## 2020-04-04 MED ORDER — SODIUM CHLORIDE 0.9 % IV SOLN
200.0000 mg | Freq: Once | INTRAVENOUS | Status: AC
  Administered 2020-04-04: 200 mg via INTRAVENOUS
  Filled 2020-04-04: qty 8

## 2020-04-04 MED ORDER — PALONOSETRON HCL INJECTION 0.25 MG/5ML
0.2500 mg | Freq: Once | INTRAVENOUS | Status: AC
  Administered 2020-04-04: 0.25 mg via INTRAVENOUS
  Filled 2020-04-04: qty 5

## 2020-04-04 MED ORDER — SODIUM CHLORIDE 0.9% FLUSH
10.0000 mL | Freq: Once | INTRAVENOUS | Status: AC
  Administered 2020-04-04: 10 mL via INTRAVENOUS
  Filled 2020-04-04: qty 10

## 2020-04-04 MED ORDER — SODIUM CHLORIDE 0.9 % IV SOLN
500.0000 mg/m2 | Freq: Once | INTRAVENOUS | Status: AC
  Administered 2020-04-04: 800 mg via INTRAVENOUS
  Filled 2020-04-04: qty 20

## 2020-04-04 MED ORDER — SODIUM CHLORIDE 0.9 % IV SOLN
Freq: Once | INTRAVENOUS | Status: AC
  Filled 2020-04-04: qty 250

## 2020-04-04 MED ORDER — SODIUM CHLORIDE 0.9 % IV SOLN
328.8000 mg | Freq: Once | INTRAVENOUS | Status: AC
  Administered 2020-04-04: 330 mg via INTRAVENOUS
  Filled 2020-04-04: qty 33

## 2020-04-04 NOTE — Progress Notes (Signed)
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   # MAY 26th-Keytrda x2 cycles; [borderline PS] July 7th-carbo Alimta Keytruda cycle #1  # NGS/MOLECULAR TESTS:   # PALLIATIVE CARE EVALUATION:  # PAIN MANAGEMENT:    DIAGNOSIS:   STAGE:         ;  GOALS:  CURRENT/MOST RECENT THERAPY :     Cancer of upper lobe of right lung ([place])  11/23/2019 Initial Diagnosis   Cancer of upper lobe of right lung ([place])   12/21/2019 -  Chemotherapy   The patient had dexamethasone (DECADRON) 4 MG tablet, 1 of 1 cycle, Start date: --, End date: -- palonosetron (ALOXI) injection 0.25 mg, 0.25 mg, Intravenous,  Once, 4 of 4 cycles Administration: 0.25 mg (02/01/2020), 0.25 mg (02/22/2020), 0.25 mg (03/14/2020) PEMEtrexed (ALIMTA) 800 mg in sodium chloride 0.9 % 100 mL chemo infusion, 500 mg/m2 = 800 mg, Intravenous,  Once, 4 of 4 cycles Administration: 800 mg (02/01/2020), 800 mg (02/22/2020), 800 mg (03/14/2020) CARBOplatin (PARAPLATIN) 330 mg in sodium chloride 0.9 % 250 mL chemo infusion, 330 mg (100 % of original dose 328.8 mg), Intravenous,  Once, 4 of 4 cycles Dose modification:   (original dose 328.8 mg, Cycle 3),   (original dose 328.8 mg, Cycle 5) Administration: 330 mg (02/01/2020), 330 mg (02/22/2020), 330 mg (03/14/2020) fosaprepitant (EMEND) 150 mg in sodium chloride 0.9 % 145 mL  IVPB, 150 mg, Intravenous,  Once, 4 of 4 cycles Administration: 150 mg (02/01/2020), 150 mg (02/22/2020), 150 mg (03/14/2020) pembrolizumab (KEYTRUDA) 200 mg in sodium chloride 0.9 % 50 mL chemo infusion, 200 mg, Intravenous, Once, 6 of 7 cycles Administration: 200 mg (12/21/2019), 200 mg (02/01/2020), 200 mg (02/22/2020), 200 mg (03/14/2020), 200 mg (01/11/2020)  for chemotherapy treatment.     HISTORY OF PRESENTING ILLNESS:  Cody [name] 64 y.o.  male patient with metastatic non-small cell lung cancer; cord compression status post decompressive surgery; currently on Keytruda- Carbo-alimta is here for follow-up.  Appetite is improving.  No weight loss.  No nausea no vomiting no diarrhea.  Continues to ambulate with a walker.  No falls.  He continues to be on home O2 2 L.  Complains of nasal congestion.  No worsening cough or fevers.  Review of Systems  Constitutional: Positive for malaise/fatigue and weight loss. Negative for chills, diaphoresis and fever.  HENT: Negative for nosebleeds and sore throat.   Eyes: Negative for double vision.  Respiratory: Positive for shortness of breath. Negative for cough, hemoptysis, sputum production and wheezing.   Cardiovascular: Negative for chest pain, palpitations, orthopnea and leg swelling.  Gastrointestinal: Negative for abdominal pain, blood in stool, constipation, diarrhea, heartburn, melena, nausea and vomiting.  Genitourinary: Negative for dysuria, frequency and urgency.  Musculoskeletal: Positive for back pain and joint pain.  Skin: Negative.  Negative for itching and rash.  Neurological: Negative for dizziness, tingling, focal weakness, weakness and headaches.  Endo/Heme/Allergies: Does not bruise/bleed easily.  Psychiatric/Behavioral:  Negative for depression. The patient is not nervous/anxious and does not have insomnia.      MEDICAL HISTORY:  Past Medical History:  Diagnosis Date  . Allergy   . COPD (chronic obstructive pulmonary disease)  ([place])   . Prostate enlargement     SURGICAL HISTORY: Past Surgical History:  Procedure Laterality Date  . HERNIA REPAIR Bilateral 1982  . HERNIA REPAIR Right 1994  . IR IMAGING GUIDED PORT INSERTION  02/24/2020  . LAMINECTOMY FOR EXCISION / EVACUATION INTRASPINAL LESION  11/07/2019   T1-T 6 at [facility]     SOCIAL HISTORY: Social History   Socioeconomic History  . Marital status: Married    Spouse name: [name]   . Number of children: 4  . Years of education: Not on file  . Highest education level: Some college, no degree  Occupational History  . Occupation: dispatch and delivery   Tobacco Use  . Smoking status: Former Smoker    Packs/day: 2.00    Years: 30.00    Pack years: 60.00    Quit date: 07/29/2011    Years since quitting: 8.6  . Smokeless tobacco: Never Used  . Tobacco comment: pt vapes  Vaping Use  . Vaping Use: Never assessed  Substance and Sexual Activity  . Alcohol use: Yes    Alcohol/week: 5.0 standard drinks    Types: 5 Standard drinks or equivalent per week    Comment: 1-2/day  . Drug use: No  . Sexual activity: Not Currently    Partners: Female  Other Topics Concern  . Not on file  Social History Narrative   Worked in warehouse/ quit smoking in 2015; beer 1/day; in [place]; with wife.    Social Determinants of Health   Financial Resource Strain: Low Risk   . Difficulty of Paying Living Expenses: Not hard at all  Food Insecurity: No Food Insecurity  . Worried About Charity fundraiser in the Last Year: Never true  . Ran Out of Food in the Last Year: Never true  Transportation Needs: No Transportation Needs  . Lack of Transportation (Medical): No  . Lack of Transportation (Non-Medical): No  Physical Activity: Insufficiently Active  . Days of Exercise per Week: 7 days  . Minutes of Exercise per Session: 20 min  Stress: No Stress Concern Present  . Feeling of Stress : Not at all  Social Connections: Socially Integrated  . Frequency of  Communication with Friends and Family: More than three times a week  . Frequency of Social Gatherings with Friends and Family: More than three times a week  . Attends Religious Services: More than 4 times per year  . Active Member of Clubs or Organizations: Yes  . Attends Archivist Meetings: More than 4 times per year  . Marital Status: Married  Human resources officer Violence: Not At Risk  . Fear of Current or Ex-Partner: No  . Emotionally Abused: No  . Physically Abused: No  . Sexually Abused: No    FAMILY HISTORY: Family History  Problem Relation Age of Onset  . Heart disease Father   . CVA Mother   . Lung cancer Sister   . Lung cancer Brother     ALLERGIES:  has No Known Allergies.  MEDICATIONS:  Current Outpatient Medications  Medication Sig Dispense Refill  . acetaminophen (TYLENOL) 500 MG tablet Take 500-1,000 mg by mouth every 6 (six) hours as needed for mild pain or fever.     Marland Kitchen albuterol (VENTOLIN HFA) 108 (90 Base)  MCG/ACT inhaler Inhale 2 puffs into the lungs every 4 (four) hours as needed for wheezing or shortness of breath. 18 g 0  . Calcium Carb-Cholecalciferol (OYSTER SHELL CALCIUM) 500-400 MG-UNIT TABS Take 500 tablets by mouth.    . escitalopram (LEXAPRO) 5 MG tablet Take 1 tablet (5 mg total) by mouth daily. am's 90 tablet 0  . Fluticasone-Umeclidin-Vilant (TRELEGY ELLIPTA) 100-62.5-25 MCG/INH AEPB Inhale 1 puff into the lungs daily. 60 each 2  . folic acid (FOLVITE) 1 MG tablet Take 1 tablet (1 mg total) by mouth daily. 90 tablet 1  . lidocaine-prilocaine (EMLA) cream Apply 1 application topically as needed. Apply small amount to port site at least 1 hour prior to it being accessed, cover with plastic wrap 30 g 1  . Multiple Vitamins-Minerals (MULTIVITAMIN GUMMIES MENS PO) Take 1 Dose by mouth daily.     . ondansetron (ZOFRAN) 4 MG tablet Take 1 tablet (4 mg total) by mouth every 8 (eight) hours as needed for nausea or vomiting. 20 tablet 2  .  prochlorperazine (COMPAZINE) 10 MG tablet Take 1 tablet (10 mg total) by mouth every 6 (six) hours as needed for nausea or vomiting. 40 tablet 1  . QUEtiapine (SEROQUEL) 25 MG tablet Take 1 tablet (25 mg total) by mouth at bedtime. 90 tablet 1   No current facility-administered medications for this visit.   Facility-Administered Medications Ordered in Other Visits  Medication Dose Route Frequency Provider Last Rate Last Admin  . heparin lock flush 100 unit/mL  500 Units Intravenous Once Charlaine Dalton R, MD          .  PHYSICAL EXAMINATION: ECOG PERFORMANCE STATUS: 3 - Symptomatic, >50% confined to bed  Vitals:   04/04/20 0839  BP: 121/74  Pulse: 81  Resp: 20  Temp: (!) 96.2 F (35.7 C)  SpO2: 99%   Filed Weights   04/04/20 0839  Weight: 125 lb 9.6 oz (57 kg)    Physical Exam Constitutional:      Comments: Thin built frail appearing male patient.  No acute distress.  HEENT negative.  Accompanied by his daughter  HENT:     Head: Normocephalic and atraumatic.     Mouth/Throat:     Pharynx: No oropharyngeal exudate.  Eyes:     Pupils: Pupils are equal, round, and reactive to light.  Cardiovascular:     Rate and Rhythm: Normal rate and regular rhythm.  Pulmonary:     Effort: No respiratory distress.     Breath sounds: No wheezing.     Comments: Decreased air entry bilaterally. Abdominal:     General: Bowel sounds are normal. There is no distension.     Palpations: Abdomen is soft. There is no mass.     Tenderness: There is no abdominal tenderness. There is no guarding or rebound.  Musculoskeletal:        General: No tenderness. Normal range of motion.     Cervical back: Normal range of motion and neck supple.  Skin:    General: Skin is warm.  Neurological:     Mental Status: He is alert and oriented to person, place, and time.  Psychiatric:        Mood and Affect: Affect normal.      LABORATORY DATA:  I have reviewed the data as listed Lab Results   Component Value Date   WBC 4.5 04/04/2020   HGB 13.5 04/04/2020   HCT 39.7 04/04/2020   MCV 88.2 04/04/2020   PLT 188 04/04/2020  Recent Labs    02/22/20 0831 03/14/20 0831 04/04/20 0827  NA 137 139 137  K 4.0 3.9 4.3  CL 100 103 99  CO2 31 29 28   GLUCOSE 117* 104* 181*  BUN 16 19 19   CREATININE 0.59* 0.56* 0.61  CALCIUM 8.7* 8.8* 8.8*  GFRNONAA >60 >60 >60  GFRAA >60 >60 >60  PROT 6.7 6.9 6.7  ALBUMIN 3.7 3.8 3.7  AST 23 24 29   ALT 20 20 20   ALKPHOS 94 82 77  BILITOT 0.5 0.5 0.6    RADIOGRAPHIC STUDIES: I have personally reviewed the radiological images as listed and agreed with the findings in the report. No results found.  ASSESSMENT & PLAN:   Cancer of upper lobe of right lung ([place]) # Stage IV metastatic non-small cell lung cancer favor adeno; mets to bone; liver; NGS pending.  PET scan May 2021-shows multiple liver lesions; lung lesions bone lesions. Currently on Keytruda+Carbo [AUC-4]+Alimta. CT scan July 27th-partial response [stable lung lesion; improved para spinal mass; improvement of the liver lesions].  Stable.  # proceed with carbo-alimta-keytruda #4  Labs today reviewed;  acceptable for treatment today. Plan imaging in October 2021/ will order at next; maintenance [place].   #Bone metastases- on zometa 3 mg IVPB.  Every 6 weeks. Ca-8.8; on  ca+ vit D BID; stable.  #Malnutrition/cachexia-continue weight loss/underlying malignancy.  S/p nutrition evaluation- STABLE.   # COPD- stable-Trelegy.  # Insomnia-continue Seroquel.stable  #Debility-spinal cord compression/status post surgery-on physical therapy--stable  # "Head cold"- sinus congestin; add claritin/ nasal saline for dryness from [facility].   # DISPOSITION: # Chemo today; # follow up in 3 weeks-  MD;labs- cbc/cmp;Zometa Keytruda  Dr.B   All questions were answered. The patient knows to call the clinic with any problems, questions or concerns.    Cammie Sickle, MD 04/04/2020 12:23  PM

## 2020-04-04 NOTE — Progress Notes (Signed)
[facility]  [phone] [phone]   Name: Cody [name] Date: 04/04/2020 MRN: [id number]  DOB: 01/03/1956  Patient Care Team: Steele Sizer, MD as PCP - General (Family Medicine) Christene Lye, MD (General Surgery) Telford Nab, RN as Oncology Nurse Navigator    REASON FOR CONSULTATION: Cody [name] is a 64 y.o. male with multiple medical problems including O2 dependent COPD and stage IV non-small cell lung cancer metastatic to spine status post T1-T4 laminectomy at [facility] on 11/13/2019 secondary to spinal cord compression from a large metastatic mass with residual paraparesis.  Patient was hospitalized from 11/29/2019 to 11/30/2019 with hypoxic respiratory failure.  He was discharged home on O2.  Patient is pending initiation of RT.  He was referred to palliative care to help address goals and manage ongoing symptoms.  SOCIAL HISTORY:     reports that he quit smoking about 8 years ago. He has a 60.00 pack-year smoking history. He has never used smokeless tobacco. He reports current alcohol use of about 5.0 standard drinks of alcohol per week. He reports that he does not use drugs.   Patient is married and lives at home with his wife.  He has 3 daughters who live in [place] and a son in [place].  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  ADVANCE DIRECTIVES:  Does not have  CODE STATUS:   PAST MEDICAL HISTORY: Past Medical History:  Diagnosis Date  . Allergy   . COPD (chronic obstructive pulmonary disease) ([place])   . Prostate enlargement     PAST SURGICAL HISTORY:  Past Surgical History:  Procedure Laterality Date  . HERNIA REPAIR Bilateral 1982  . HERNIA REPAIR Right 1994  . IR IMAGING GUIDED PORT INSERTION  02/24/2020  . 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   # MAY 26th-Keytrda x2 cycles; [borderline PS] July 7th-carbo Alimta Keytruda cycle #1  # NGS/MOLECULAR TESTS:   # PALLIATIVE CARE EVALUATION:  # PAIN MANAGEMENT:    DIAGNOSIS:   STAGE:         ;  GOALS:  CURRENT/MOST RECENT THERAPY :     Cancer of upper lobe of right lung ([place])  11/23/2019 Initial Diagnosis   Cancer of upper lobe of right lung ([place])   12/21/2019 -  Chemotherapy   The patient had dexamethasone (DECADRON) 4 MG tablet, 1 of 1 cycle, Start date: --, End date: -- palonosetron (ALOXI) injection 0.25 mg, 0.25 mg, Intravenous,  Once, 4 of 4 cycles Administration: 0.25 mg (02/01/2020), 0.25 mg (02/22/2020), 0.25 mg (03/14/2020) PEMEtrexed (ALIMTA) 800 mg in sodium chloride 0.9 % 100 mL chemo infusion, 500 mg/m2 = 800 mg, Intravenous,  Once, 4 of 4 cycles Administration: 800 mg (02/01/2020), 800 mg (02/22/2020), 800 mg (03/14/2020) CARBOplatin (PARAPLATIN) 330 mg in sodium chloride 0.9 % 250 mL chemo infusion, 330 mg (100 % of original dose 328.8 mg), Intravenous,  Once, 4 of 4 cycles Dose modification:   (  original dose 328.8 mg, Cycle 3),   (original dose 328.8 mg, Cycle 5) Administration: 330 mg (02/01/2020), 330 mg (02/22/2020), 330 mg (03/14/2020) fosaprepitant (EMEND) 150 mg in sodium chloride 0.9 % 145 mL IVPB, 150 mg, Intravenous,  Once, 4 of 4 cycles Administration: 150 mg (02/01/2020), 150 mg (02/22/2020), 150 mg (03/14/2020) pembrolizumab (KEYTRUDA) 200 mg in sodium chloride 0.9 % 50 mL chemo infusion, 200 mg, Intravenous, Once, 6 of 7 cycles Administration: 200  mg (12/21/2019), 200 mg (02/01/2020), 200 mg (02/22/2020), 200 mg (03/14/2020), 200 mg (01/11/2020)  for chemotherapy treatment.      ALLERGIES:  has No Known Allergies.  MEDICATIONS:  Current Outpatient Medications  Medication Sig Dispense Refill  . acetaminophen (TYLENOL) 500 MG tablet Take 500-1,000 mg by mouth every 6 (six) hours as needed for mild pain or fever.     Marland Kitchen albuterol (VENTOLIN HFA) 108 (90 Base) MCG/ACT inhaler Inhale 2 puffs into the lungs every 4 (four) hours as needed for wheezing or shortness of breath. 18 g 0  . Calcium Carb-Cholecalciferol (OYSTER SHELL CALCIUM) 500-400 MG-UNIT TABS Take 500 tablets by mouth.    . escitalopram (LEXAPRO) 5 MG tablet Take 1 tablet (5 mg total) by mouth daily. am's 90 tablet 0  . Fluticasone-Umeclidin-Vilant (TRELEGY ELLIPTA) 100-62.5-25 MCG/INH AEPB Inhale 1 puff into the lungs daily. 60 each 2  . folic acid (FOLVITE) 1 MG tablet Take 1 tablet (1 mg total) by mouth daily. 90 tablet 1  . lidocaine-prilocaine (EMLA) cream Apply 1 application topically as needed. Apply small amount to port site at least 1 hour prior to it being accessed, cover with plastic wrap 30 g 1  . Multiple Vitamins-Minerals (MULTIVITAMIN GUMMIES MENS PO) Take 1 Dose by mouth daily.     . ondansetron (ZOFRAN) 4 MG tablet Take 1 tablet (4 mg total) by mouth every 8 (eight) hours as needed for nausea or vomiting. 20 tablet 2  . prochlorperazine (COMPAZINE) 10 MG tablet Take 1 tablet (10 mg total) by mouth every 6 (six) hours as needed for nausea or vomiting. 40 tablet 1  . QUEtiapine (SEROQUEL) 25 MG tablet Take 1 tablet (25 mg total) by mouth at bedtime. 90 tablet 1   No current facility-administered medications for this visit.   Facility-Administered Medications Ordered in Other Visits  Medication Dose Route Frequency Provider Last Rate Last Admin  . CARBOplatin (PARAPLATIN) 330 mg in sodium chloride 0.9 % 250 mL chemo infusion  330 mg Intravenous Once Cammie Sickle, MD 566 mL/hr at 04/04/20 1119 330 mg at 04/04/20 1119  . cyanocobalamin ((VITAMIN B-12)) injection 1,000 mcg  1,000 mcg Intramuscular Once Charlaine Dalton R, MD      . heparin lock flush 100 unit/mL  500 Units Intravenous Once Charlaine Dalton R, MD      . PEMEtrexed (ALIMTA) 800 mg in sodium chloride 0.9 % 100 mL chemo infusion  500 mg/m2 (Treatment Plan Recorded) Intravenous Once Cammie Sickle, MD        VITAL SIGNS: There were no vitals taken for this visit. There were no vitals filed for this visit.  Estimated body mass index is 18.55 kg/m as calculated from the following:   Height as of an earlier encounter on 04/04/20: 5' 9"  (1.753 m).   Weight as of an earlier encounter on 04/04/20: 125 lb 9.6 oz (57 kg).  LABS: CBC:    Component Value Date/Time   WBC 4.5 04/04/2020 0827   HGB 13.5 04/04/2020 0827   HGB 15.2  10/21/2019 1517   HCT 39.7 04/04/2020 0827   HCT 43.0 10/21/2019 1517   PLT 188 04/04/2020 0827   PLT 325 10/21/2019 1517   MCV 88.2 04/04/2020 0827   MCV 86 10/21/2019 1517   NEUTROABS 3.0 04/04/2020 0827   NEUTROABS 5.9 10/21/2019 1517   LYMPHSABS 0.8 04/04/2020 0827   LYMPHSABS 1.3 10/21/2019 1517   MONOABS 0.5 04/04/2020 0827   EOSABS 0.2 04/04/2020 0827   EOSABS 0.0 10/21/2019 1517   BASOSABS 0.0 04/04/2020 0827   BASOSABS 0.1 10/21/2019 1517   Comprehensive Metabolic Panel:    Component Value Date/Time   NA 137 04/04/2020 0827   NA 135 10/21/2019 1517   K 4.3 04/04/2020 0827   CL 99 04/04/2020 0827   CO2 28 04/04/2020 0827   BUN 19 04/04/2020 0827   BUN 14 10/21/2019 1517   CREATININE 0.61 04/04/2020 0827   CREATININE 0.87 03/16/2017 1211   GLUCOSE 181 (H) 04/04/2020 0827   CALCIUM 8.8 (L) 04/04/2020 0827   AST 29 04/04/2020 0827   ALT 20 04/04/2020 0827   ALKPHOS 77 04/04/2020 0827   BILITOT 0.6 04/04/2020 0827   BILITOT 0.5 10/21/2019 1517   PROT 6.7 04/04/2020 0827   PROT 7.0 10/21/2019 1517   ALBUMIN 3.7 04/04/2020 0827    ALBUMIN 3.6 (L) 10/21/2019 1517    RADIOGRAPHIC STUDIES: No results found.  PERFORMANCE STATUS (ECOG) : 2 - Symptomatic, <50% confined to bed  Review of Systems Unless otherwise noted, a complete review of systems is negative.  Physical Exam General: NAD, frail appearing, in wheelchair Pulmonary: Unlabored, on O2 Extremities: no edema, no joint deformities Skin: no rashes Neurological: Weakness but otherwise nonfocal  IMPRESSION: Routine follow-up visit.  Patient seen in the infusion area.  Overall, patient reports he is doing well.  He denies any significant changes or concerns today.  No symptomatic complaints at present.  Patient reports stable performance status.  Appetite is adequate.  He is drinking supplements daily.  He is sleeping well.  I rereviewed with him a MOST form, which she took on the discussed with family.  He says that he has recently completed ACP documents and that his daughter is his healthcare power of attorney.  I requested that he bring those documents into the clinic to be scanned into his chart.  PLAN: -Continue current scope of treatment -ACP/MOST form reviewed -Follow-up MyChart visit in 1 to 2 months   Patient expressed understanding and was in agreement with this plan. He also understands that He can call the clinic at any time with any questions, concerns, or complaints.     Time Total: 15 minutes  Visit consisted of counseling and education dealing with the complex and emotionally intense issues of symptom management and palliative care in the setting of serious and potentially life-threatening illness.Greater than 50%  of this time was spent counseling and coordinating care related to the above assessment and plan.  Signed by: Altha Harm, PhD, NP-C

## 2020-04-04 NOTE — Assessment & Plan Note (Addendum)
#   Stage IV metastatic non-small cell lung cancer favor adeno; mets to bone; liver; NGS pending.  PET scan May 2021-shows multiple liver lesions; lung lesions bone lesions. Currently on Keytruda+Carbo [AUC-4]+Alimta. CT scan July 27th-partial response [stable lung lesion; improved para spinal mass; improvement of the liver lesions].  Stable.  # proceed with carbo-alimta-keytruda #4  Labs today reviewed;  acceptable for treatment today. Plan imaging in October 2021/ will order at next; maintenance [place].   #Bone metastases- on zometa 3 mg IVPB.  Every 6 weeks. Ca-8.8; on  ca+ vit D BID; stable.  #Malnutrition/cachexia-continue weight loss/underlying malignancy.  S/p nutrition evaluation- STABLE.   # COPD- stable-Trelegy.  # Insomnia-continue Seroquel.stable  #Debility-spinal cord compression/status post surgery-on physical therapy--stable  # "Head cold"- sinus congestin; add claritin/ nasal saline for dryness from [facility].   # DISPOSITION: # Chemo today; # follow up in 3 weeks-  MD;labs- cbc/cmp;Zometa Keytruda  Dr.B

## 2020-04-09 DIAGNOSIS — R531 Weakness: Secondary | ICD-10-CM | POA: Diagnosis not present

## 2020-04-09 NOTE — Progress Notes (Addendum)
Name: Cody [name]   MRN: [id number]    DOB: September 25, 1955   Date:04/10/2020       Progress Note  Subjective  Chief Complaint  Follow up   HPI  Depression.Anxiety: he is doing well on Seroquel for sleep takes about 30 minutes, he is doing much better with lexapro in am's, and it has really helped with his mood. He is coping well with cancer therapy. Wife is back at work, kids are still supportive, but he has been able to stay home alone a few days a week, it has helped him to stay more activity , he is going to rehab and is feeling stronger.   Lung Cancer/Emphysema/hypoxia/on oxygen: he finished radiation therapy, going now for chemo and immunotherapy, tolerated it well, he is on 2 liters of oxygen, he is using a walker since the compression fracture from tumor, h e still has outpatient PT and is getting outpatient PT twice a week to improve strength on upper an lower body.  He is no longer using a back brace . He has been using Trelegy and seems to be stable on medication. His sob is stable on 2 liters of oxygen, no cough or wheezing.   Malnutrition: he has gained more weight since last visit. He is trying to gain weight by having more protein and carbohydrates. His last A1C was high and during chemo treatment last week glucose was over 180, we will recheck A1C  Atherosclerosis: under palliative care at this time, we will not give statin therapy. Unchanged   BPH with elevated PSA: not doing any further investigation because of palliative treatment for lung cancer . He has urgency only and stable, he does not want to try any new medications at this time  Senile purpura: both arms, reassurance given   Patient Active Problem List   Diagnosis Date Noted  . S/P spinal fusion 12/23/2019  . Goals of care, counseling/discussion 12/11/2019  . Hypoxia 11/30/2019  . 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 COPD with acute exacerbation ([place]) 11/29/2019  . Cancer of upper  lobe of right lung ([place]) 11/23/2019  . Metastatic cancer to spine ([place]) 11/01/2019  . BPH with elevated PSA 07/16/2015  . COPD bronchitis 07/16/2015  . Seasonal and perennial allergic rhinitis 04/24/2010    Past Surgical History:  Procedure Laterality Date  . HERNIA REPAIR Bilateral 1982  . HERNIA REPAIR Right 1994  . IR IMAGING GUIDED PORT INSERTION  02/24/2020  . LAMINECTOMY FOR EXCISION / EVACUATION INTRASPINAL LESION  11/07/2019   T1-T 6 at [facility]     Family History  Problem Relation Age of Onset  . Heart disease Father   . CVA Mother   . Lung cancer Sister   . Lung cancer Brother     Social History   Tobacco Use  . Smoking status: Former Smoker    Packs/day: 2.00    Years: 30.00    Pack years: 60.00    Quit date: 07/29/2011    Years since quitting: 8.7  . Smokeless tobacco: Never Used  . Tobacco comment: pt vapes  Substance Use Topics  . Alcohol use: Yes    Alcohol/week: 5.0 standard drinks    Types: 5 Standard drinks or equivalent per week    Comment: 1-2/day     Current Outpatient Medications:  .  acetaminophen (TYLENOL) 500 MG tablet, Take 500-1,000 mg by mouth every 6 (six) hours as needed for mild pain or fever. , Disp: ,  Rfl:  .  albuterol (VENTOLIN HFA) 108 (90 Base) MCG/ACT inhaler, Inhale 2 puffs into the lungs every 4 (four) hours as needed for wheezing or shortness of breath., Disp: 18 g, Rfl: 0 .  Calcium Carb-Cholecalciferol (OYSTER SHELL CALCIUM) 500-400 MG-UNIT TABS, Take 500 tablets by mouth., Disp: , Rfl:  .  escitalopram (LEXAPRO) 5 MG tablet, Take 1 tablet (5 mg total) by mouth daily. am's, Disp: 90 tablet, Rfl: 0 .  Fluticasone-Umeclidin-Vilant (TRELEGY ELLIPTA) 100-62.5-25 MCG/INH AEPB, Inhale 1 puff into the lungs daily., Disp: 60 each, Rfl: 2 .  folic acid (FOLVITE) 1 MG tablet, Take 1 tablet (1 mg total) by mouth daily., Disp: 90 tablet, Rfl: 1 .  lidocaine-prilocaine (EMLA) cream, Apply 1 application topically as needed. Apply small amount to  port site at least 1 hour prior to it being accessed, cover with plastic wrap, Disp: 30 g, Rfl: 1 .  Multiple Vitamins-Minerals (MULTIVITAMIN GUMMIES MENS PO), Take 1 Dose by mouth daily. , Disp: , Rfl:  .  ondansetron (ZOFRAN) 4 MG tablet, Take 1 tablet (4 mg total) by mouth every 8 (eight) hours as needed for nausea or vomiting., Disp: 20 tablet, Rfl: 2 .  prochlorperazine (COMPAZINE) 10 MG tablet, Take 1 tablet (10 mg total) by mouth every 6 (six) hours as needed for nausea or vomiting., Disp: 40 tablet, Rfl: 1 .  QUEtiapine (SEROQUEL) 25 MG tablet, Take 1 tablet (25 mg total) by mouth at bedtime., Disp: 90 tablet, Rfl: 1  No Known Allergies  I personally reviewed active problem list, medication list, allergies, family history, social history, health maintenance with the patient/caregiver today.   ROS  Constitutional: Negative for fever, positive for  weight change.  Respiratory: Negative for cough but has stable  shortness of breath.   Cardiovascular: Negative for chest pain or palpitations.  Gastrointestinal: Negative for abdominal pain, no bowel changes.  Musculoskeletal: positive  for gait problem but no joint swelling.  Skin: Negative for rash.  Neurological: Negative for dizziness or headache.  No other specific complaints in a complete review of systems (except as listed in HPI above). Objective  Vitals:   04/10/20 1051  BP: 110/78  Pulse: 100  Resp: 20  Temp: 97.9 F (36.6 C)  TempSrc: Oral  SpO2: 99%  Weight: 124 lb 14.4 oz (56.7 kg)  Height: 5\' 9"  (1.753 m)    Body mass index is 18.44 kg/m.  Physical Exam  Constitutional: Patient appears well-developed but very thin   No distress.  HEENT: head atraumatic, normocephalic, pupils equal and reactive to light,neck supple Cardiovascular: Normal rate, regular rhythm and normal heart sounds.  No murmur heard. No BLE edema. Pulmonary/Chest: Effort normal and breath sounds normal. No respiratory distress. Abdominal:  Soft.  There is no tenderness. Muscular Skeletal: using a walker  Skin: senile purpura left wrist and right arm  Psychiatric: Patient has a normal mood and affect. behavior is normal. Judgment and thought content normal.  Recent Results (from the past 2160 hour(s))  Comprehensive metabolic panel     Status: Abnormal   Collection Time: 02/01/20  8:37 AM  Result Value Ref Range   Sodium 137 135 - 145 mmol/L   Potassium 4.0 3.5 - 5.1 mmol/L   Chloride 97 (L) 98 - 111 mmol/L   CO2 30 22 - 32 mmol/L   Glucose, Bld 141 (H) 70 - 99 mg/dL    Comment: Glucose reference range applies only to samples taken after fasting for at least 8 hours.  BUN 10 8 - 23 mg/dL   Creatinine, Ser 0.54 (L) 0.61 - 1.24 mg/dL   Calcium 9.2 8.9 - 10.3 mg/dL   Total Protein 7.4 6.5 - 8.1 g/dL   Albumin 3.4 (L) 3.5 - 5.0 g/dL   AST 32 15 - 41 U/L   ALT 26 0 - 44 U/L   Alkaline Phosphatase 159 (H) 38 - 126 U/L   Total Bilirubin 0.3 0.3 - 1.2 mg/dL   GFR calc non Af Amer >60 >60 mL/min   GFR calc Af Amer >60 >60 mL/min   Anion gap 10 5 - 15    Comment: Performed at [facility], [address] [place], [place] [address]  CBC with Differential/Platelet     Status: None   Collection Time: 02/01/20  8:37 AM  Result Value Ref Range   WBC 5.2 4.0 - 10.5 K/uL   RBC 4.89 4.22 - 5.81 MIL/uL   Hemoglobin 13.8 13.0 - 17.0 g/dL   HCT 42.0 39 - 52 %   MCV 85.9 80.0 - 100.0 fL   MCH 28.2 26.0 - 34.0 pg   MCHC 32.9 30.0 - 36.0 g/dL   RDW 14.6 11.5 - 15.5 %   Platelets 241 150 - 400 K/uL   nRBC 0.0 0.0 - 0.2 %   Neutrophils Relative % 60 %   Neutro Abs 3.1 1.7 - 7.7 K/uL   Lymphocytes Relative 19 %   Lymphs Abs 1.0 0.7 - 4.0 K/uL   Monocytes Relative 13 %   Monocytes Absolute 0.7 0 - 1 K/uL   Eosinophils Relative 7 %   Eosinophils Absolute 0.4 0 - 0 K/uL   Basophils Relative 1 %   Basophils Absolute 0.1 0 - 0 K/uL   Immature Granulocytes 0 %   Abs Immature Granulocytes 0.02 0.00 - 0.07 K/uL    Comment:  Performed at [facility], [address] [place], [place] [address]  Basic metabolic panel     Status: Abnormal   Collection Time: 02/10/20  8:22 AM  Result Value Ref Range   Sodium 136 135 - 145 mmol/L   Potassium 4.1 3.5 - 5.1 mmol/L   Chloride 97 (L) 98 - 111 mmol/L   CO2 28 22 - 32 mmol/L   Glucose, Bld 164 (H) 70 - 99 mg/dL    Comment: Glucose reference range applies only to samples taken after fasting for at least 8 hours.   BUN 16 8 - 23 mg/dL   Creatinine, Ser 0.67 0.61 - 1.24 mg/dL   Calcium 8.8 (L) 8.9 - 10.3 mg/dL   GFR calc non Af Amer >60 >60 mL/min   GFR calc Af Amer >60 >60 mL/min   Anion gap 11 5 - 15    Comment: Performed at [facility], [address] [place], [place] [address]  CBC with Differential     Status: Abnormal   Collection Time: 02/10/20  8:22 AM  Result Value Ref Range   WBC 2.2 (L) 4.0 - 10.5 K/uL   RBC 4.71 4.22 - 5.81 MIL/uL   Hemoglobin 13.1 13.0 - 17.0 g/dL   HCT 40.4 39 - 52 %   MCV 85.8 80.0 - 100.0 fL   MCH 27.8 26.0 - 34.0 pg   MCHC 32.4 30.0 - 36.0 g/dL   RDW 14.2 11.5 - 15.5 %   Platelets 156 150 - 400 K/uL   nRBC 0.0 0.0 - 0.2 %   Neutrophils Relative % 38 %   Neutro  Abs 0.9 (L) 1.7 - 7.7 K/uL   Lymphocytes Relative 34 %   Lymphs Abs 0.8 0.7 - 4.0 K/uL   Monocytes Relative 20 %   Monocytes Absolute 0.4 0 - 1 K/uL   Eosinophils Relative 7 %   Eosinophils Absolute 0.2 0 - 0 K/uL   Basophils Relative 1 %   Basophils Absolute 0.0 0 - 0 K/uL   Immature Granulocytes 0 %   Abs Immature Granulocytes 0.01 0.00 - 0.07 K/uL    Comment: Performed at [facility], [address] [place], [place] [address]  Comprehensive metabolic panel     Status: Abnormal   Collection Time: 02/22/20  8:31 AM  Result Value Ref Range   Sodium 137 135 - 145 mmol/L   Potassium 4.0 3.5 - 5.1 mmol/L   Chloride 100 98 - 111 mmol/L   CO2 31 22 - 32 mmol/L   Glucose, Bld 117 (H) 70 - 99 mg/dL    Comment: Glucose reference range applies  only to samples taken after fasting for at least 8 hours.   BUN 16 8 - 23 mg/dL   Creatinine, Ser 0.59 (L) 0.61 - 1.24 mg/dL   Calcium 8.7 (L) 8.9 - 10.3 mg/dL   Total Protein 6.7 6.5 - 8.1 g/dL   Albumin 3.7 3.5 - 5.0 g/dL   AST 23 15 - 41 U/L   ALT 20 0 - 44 U/L   Alkaline Phosphatase 94 38 - 126 U/L   Total Bilirubin 0.5 0.3 - 1.2 mg/dL   GFR calc non Af Amer >60 >60 mL/min   GFR calc Af Amer >60 >60 mL/min   Anion gap 6 5 - 15    Comment: Performed at [facility], [address] [place], [place] [address]  CBC with Differential/Platelet     Status: Abnormal   Collection Time: 02/22/20  8:31 AM  Result Value Ref Range   WBC 3.5 (L) 4.0 - 10.5 K/uL   RBC 4.69 4.22 - 5.81 MIL/uL   Hemoglobin 13.2 13.0 - 17.0 g/dL   HCT 41.6 39 - 52 %   MCV 88.7 80.0 - 100.0 fL   MCH 28.1 26.0 - 34.0 pg   MCHC 31.7 30.0 - 36.0 g/dL   RDW 15.4 11.5 - 15.5 %   Platelets 250 150 - 400 K/uL   nRBC 0.0 0.0 - 0.2 %   Neutrophils Relative % 54 %   Neutro Abs 1.9 1.7 - 7.7 K/uL   Lymphocytes Relative 21 %   Lymphs Abs 0.8 0.7 - 4.0 K/uL   Monocytes Relative 15 %   Monocytes Absolute 0.5 0 - 1 K/uL   Eosinophils Relative 9 %   Eosinophils Absolute 0.3 0 - 0 K/uL   Basophils Relative 1 %   Basophils Absolute 0.0 0 - 0 K/uL   Immature Granulocytes 0 %   Abs Immature Granulocytes 0.01 0.00 - 0.07 K/uL    Comment: Performed at [facility], [address] [place], [place] [address]  TSH     Status: None   Collection Time: 03/14/20  8:31 AM  Result Value Ref Range   TSH 2.719 0.350 - 4.500 uIU/mL    Comment: Performed by a 3rd Generation assay with a functional sensitivity of <=0.01 uIU/mL. Performed at [facility], [address], [place], [place] [address]   CBC with Differential/Platelet     Status: Abnormal   Collection Time: 03/14/20  8:31 AM  Result Value Ref Range  WBC 4.7 4.0 - 10.5 K/uL   RBC 4.68 4.22 - 5.81 MIL/uL   Hemoglobin 13.2 13.0 - 17.0 g/dL   HCT  40.3 39 - 52 %   MCV 86.1 80.0 - 100.0 fL   MCH 28.2 26.0 - 34.0 pg   MCHC 32.8 30.0 - 36.0 g/dL   RDW 16.0 (H) 11.5 - 15.5 %   Platelets 198 150 - 400 K/uL   nRBC 0.0 0.0 - 0.2 %   Neutrophils Relative % 65 %   Neutro Abs 3.0 1.7 - 7.7 K/uL   Lymphocytes Relative 17 %   Lymphs Abs 0.8 0.7 - 4.0 K/uL   Monocytes Relative 13 %   Monocytes Absolute 0.6 0 - 1 K/uL   Eosinophils Relative 4 %   Eosinophils Absolute 0.2 0 - 0 K/uL   Basophils Relative 1 %   Basophils Absolute 0.0 0 - 0 K/uL   Immature Granulocytes 0 %   Abs Immature Granulocytes 0.01 0.00 - 0.07 K/uL    Comment: Performed at [facility], [address] [place], [place] [address]  Comprehensive metabolic panel     Status: Abnormal   Collection Time: 03/14/20  8:31 AM  Result Value Ref Range   Sodium 139 135 - 145 mmol/L   Potassium 3.9 3.5 - 5.1 mmol/L   Chloride 103 98 - 111 mmol/L   CO2 29 22 - 32 mmol/L   Glucose, Bld 104 (H) 70 - 99 mg/dL    Comment: Glucose reference range applies only to samples taken after fasting for at least 8 hours.   BUN 19 8 - 23 mg/dL   Creatinine, Ser 0.56 (L) 0.61 - 1.24 mg/dL   Calcium 8.8 (L) 8.9 - 10.3 mg/dL   Total Protein 6.9 6.5 - 8.1 g/dL   Albumin 3.8 3.5 - 5.0 g/dL   AST 24 15 - 41 U/L   ALT 20 0 - 44 U/L   Alkaline Phosphatase 82 38 - 126 U/L   Total Bilirubin 0.5 0.3 - 1.2 mg/dL   GFR calc non Af Amer >60 >60 mL/min   GFR calc Af Amer >60 >60 mL/min   Anion gap 7 5 - 15    Comment: Performed at [facility], [address] [place], [place] [address]  Comprehensive metabolic panel     Status: Abnormal   Collection Time: 04/04/20  8:27 AM  Result Value Ref Range   Sodium 137 135 - 145 mmol/L   Potassium 4.3 3.5 - 5.1 mmol/L   Chloride 99 98 - 111 mmol/L   CO2 28 22 - 32 mmol/L   Glucose, Bld 181 (H) 70 - 99 mg/dL    Comment: Glucose reference range applies only to samples taken after fasting for at least 8 hours.   BUN 19 8 - 23 mg/dL   Creatinine,  Ser 0.61 0.61 - 1.24 mg/dL   Calcium 8.8 (L) 8.9 - 10.3 mg/dL   Total Protein 6.7 6.5 - 8.1 g/dL   Albumin 3.7 3.5 - 5.0 g/dL   AST 29 15 - 41 U/L   ALT 20 0 - 44 U/L   Alkaline Phosphatase 77 38 - 126 U/L   Total Bilirubin 0.6 0.3 - 1.2 mg/dL   GFR calc non Af Amer >60 >60 mL/min   GFR calc Af Amer >60 >60 mL/min   Anion gap 10 5 - 15    Comment: Performed at [facility], [address], [place], [place] [address]  CBC  with Differential/Platelet     Status: Abnormal   Collection Time: 04/04/20  8:27 AM  Result Value Ref Range   WBC 4.5 4.0 - 10.5 K/uL   RBC 4.50 4.22 - 5.81 MIL/uL   Hemoglobin 13.5 13.0 - 17.0 g/dL   HCT 39.7 39 - 52 %   MCV 88.2 80.0 - 100.0 fL   MCH 30.0 26.0 - 34.0 pg   MCHC 34.0 30.0 - 36.0 g/dL   RDW 16.7 (H) 11.5 - 15.5 %   Platelets 188 150 - 400 K/uL   nRBC 0.0 0.0 - 0.2 %   Neutrophils Relative % 66 %   Neutro Abs 3.0 1.7 - 7.7 K/uL   Lymphocytes Relative 18 %   Lymphs Abs 0.8 0.7 - 4.0 K/uL   Monocytes Relative 11 %   Monocytes Absolute 0.5 0 - 1 K/uL   Eosinophils Relative 4 %   Eosinophils Absolute 0.2 0 - 0 K/uL   Basophils Relative 1 %   Basophils Absolute 0.0 0 - 0 K/uL   Immature Granulocytes 0 %   Abs Immature Granulocytes 0.02 0.00 - 0.07 K/uL    Comment: Performed at [facility], [place], [place] [address]      PHQ2/9: Depression screen [facility] 2/9 04/10/2020 01/05/2020 12/08/2019 11/25/2019 10/21/2019  Decreased Interest 0 0 1 0 0  Down, Depressed, Hopeless 0 0 1 0 1  PHQ - 2 Score 0 0 2 0 1  Altered sleeping 0 1 2 0 0  Tired, decreased energy 0 0 1 0 0  Change in appetite 0 0 0 0 0  Feeling bad or failure about yourself  0 0 1 0 0  Trouble concentrating 0 0 0 0 0  Moving slowly or fidgety/restless 0 0 0 0 0  Suicidal thoughts 0 0 0 0 0  PHQ-9 Score 0 1 6 0 1  Difficult doing work/chores - Not difficult at all Not difficult at all - Not difficult at all    phq 9 is negative   Fall Risk: Fall Risk   04/10/2020 01/05/2020 12/08/2019 11/25/2019 10/21/2019  Falls in the past year? 0 0 0 0 0  Number falls in past yr: 0 - 0 0 0  Injury with Fall? 0 - 0 0 0  Risk for fall due to : - Impaired mobility;Impaired balance/gait - - -     Functional Status Survey: Is the patient deaf or have difficulty hearing?: No Does the patient have difficulty seeing, even when wearing glasses/contacts?: No Does the patient have difficulty concentrating, remembering, or making decisions?: No Does the patient have difficulty walking or climbing stairs?: Yes Does the patient have difficulty dressing or bathing?: No Does the patient have difficulty doing errands alone such as visiting a doctor's office or shopping?: Yes    Assessment & Plan  1. Metastatic cancer to spine [facility])  Keep follow up with oncologist   2. 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 Need for immunization against influenza  - Flu Vaccine QUAD High Dose(Fluad)  4. Dysthymia  - escitalopram (LEXAPRO) 5 MG tablet; Take 1 tablet (5 mg total) by mouth daily. am's  Dispense: 90 tablet; Refill: 1  5. Insomnia, unspecified type  - QUEtiapine (SEROQUEL) 25 MG tablet; Take 1 tablet (25 mg total) by mouth at bedtime.  Dispense: 90 tablet; Refill: 1  6. Atherosclerosis of aorta [facility])  Not on statin , palliative care   7. Centrilobular emphysema (  [place])  - Fluticasone-Umeclidin-Vilant (TRELEGY ELLIPTA) 100-62.5-25 MCG/INH AEPB; Inhale 1 puff into the lungs daily.  Dispense: 180 each; Refill: 1  8. Gait instability  Still getting PT   9. Bilateral leg numbness   10. BPH with elevated PSA   11. Hyperglycemia  - POCT HgB A1C  12. Anxiety  - escitalopram (LEXAPRO) 5 MG tablet; Take 1 tablet (5 mg total) by mouth daily. am's  Dispense: 90 tablet; Refill: 1   13. Senile purpura ([facility])  Both arms

## 2020-04-10 ENCOUNTER — Ambulatory Visit (INDEPENDENT_AMBULATORY_CARE_PROVIDER_SITE_OTHER): Payer: Medicare HMO | Admitting: Family Medicine

## 2020-04-10 ENCOUNTER — Encounter: Payer: Self-pay | Admitting: Family Medicine

## 2020-04-10 ENCOUNTER — Other Ambulatory Visit: Payer: Self-pay

## 2020-04-10 VITALS — BP 110/78 | HR 100 | Temp 97.9°F | Resp 20 | Ht 69.0 in | Wt 124.9 lb

## 2020-04-10 DIAGNOSIS — R739 Hyperglycemia, unspecified: Secondary | ICD-10-CM | POA: Diagnosis not present

## 2020-04-10 DIAGNOSIS — I700 Atherosclerosis of aorta: Secondary | ICD-10-CM | POA: Diagnosis not present

## 2020-04-10 DIAGNOSIS — F341 Dysthymic disorder: Secondary | ICD-10-CM | POA: Diagnosis not present

## 2020-04-10 DIAGNOSIS — G4700 Insomnia, unspecified: Secondary | ICD-10-CM | POA: Diagnosis not present

## 2020-04-10 DIAGNOSIS — R69 Illness, unspecified: Secondary | ICD-10-CM | POA: Diagnosis not present

## 2020-04-10 DIAGNOSIS — R200 Anesthesia of skin: Secondary | ICD-10-CM | POA: Diagnosis not present

## 2020-04-10 DIAGNOSIS — J432 Centrilobular emphysema: Secondary | ICD-10-CM

## 2020-04-10 DIAGNOSIS — C3410 Malignant neoplasm of upper lobe, unspecified bronchus or lung: Secondary | ICD-10-CM | POA: Diagnosis not present

## 2020-04-10 DIAGNOSIS — F419 Anxiety disorder, unspecified: Secondary | ICD-10-CM

## 2020-04-10 DIAGNOSIS — R2681 Unsteadiness on feet: Secondary | ICD-10-CM | POA: Diagnosis not present

## 2020-04-10 DIAGNOSIS — R972 Elevated prostate specific antigen [PSA]: Secondary | ICD-10-CM

## 2020-04-10 DIAGNOSIS — Z23 Encounter for immunization: Secondary | ICD-10-CM | POA: Diagnosis not present

## 2020-04-10 DIAGNOSIS — C7951 Secondary malignant neoplasm of bone: Secondary | ICD-10-CM | POA: Diagnosis not present

## 2020-04-10 DIAGNOSIS — D692 Other nonthrombocytopenic purpura: Secondary | ICD-10-CM | POA: Insufficient documentation

## 2020-04-10 DIAGNOSIS — N400 Enlarged prostate without lower urinary tract symptoms: Secondary | ICD-10-CM

## 2020-04-10 LAB — POCT GLYCOSYLATED HEMOGLOBIN (HGB A1C): Hemoglobin A1C: 5.8 % — AB (ref 4.0–5.6)

## 2020-04-10 MED ORDER — ESCITALOPRAM OXALATE 5 MG PO TABS: 5.0000 mg | ORAL_TABLET | Freq: Every day | ORAL | 1 refills | Status: DC

## 2020-04-10 MED ORDER — QUETIAPINE FUMARATE 25 MG PO TABS: 25.0000 mg | ORAL_TABLET | Freq: Every day | ORAL | 1 refills | Status: DC

## 2020-04-10 MED ORDER — TRELEGY ELLIPTA 100-62.5-25 MCG/INH IN AEPB: 1.0000 | INHALATION_SPRAY | Freq: Every day | RESPIRATORY_TRACT | 1 refills | Status: DC

## 2020-04-10 NOTE — Patient Instructions (Signed)

## 2020-04-12 DIAGNOSIS — R531 Weakness: Secondary | ICD-10-CM | POA: Diagnosis not present

## 2020-04-16 ENCOUNTER — Other Ambulatory Visit: Payer: Self-pay

## 2020-04-16 ENCOUNTER — Ambulatory Visit (INDEPENDENT_AMBULATORY_CARE_PROVIDER_SITE_OTHER): Payer: Medicare HMO

## 2020-04-16 DIAGNOSIS — Z136 Encounter for screening for cardiovascular disorders: Secondary | ICD-10-CM

## 2020-04-16 DIAGNOSIS — Z87891 Personal history of nicotine dependence: Secondary | ICD-10-CM | POA: Diagnosis not present

## 2020-04-18 DIAGNOSIS — H524 Presbyopia: Secondary | ICD-10-CM | POA: Diagnosis not present

## 2020-04-18 DIAGNOSIS — Q150 Congenital glaucoma: Secondary | ICD-10-CM | POA: Diagnosis not present

## 2020-04-25 ENCOUNTER — Inpatient Hospital Stay (HOSPITAL_BASED_OUTPATIENT_CLINIC_OR_DEPARTMENT_OTHER): Payer: Medicare HMO | Admitting: Internal Medicine

## 2020-04-25 ENCOUNTER — Inpatient Hospital Stay: Payer: Medicare HMO

## 2020-04-25 ENCOUNTER — Other Ambulatory Visit: Payer: Self-pay

## 2020-04-25 ENCOUNTER — Encounter: Payer: Self-pay | Admitting: Internal Medicine

## 2020-04-25 DIAGNOSIS — Z7189 Other specified counseling: Secondary | ICD-10-CM

## 2020-04-25 DIAGNOSIS — C3411 Malignant neoplasm of upper lobe, right bronchus or lung: Secondary | ICD-10-CM

## 2020-04-25 DIAGNOSIS — Z5112 Encounter for antineoplastic immunotherapy: Secondary | ICD-10-CM | POA: Diagnosis not present

## 2020-04-25 DIAGNOSIS — C7951 Secondary malignant neoplasm of bone: Secondary | ICD-10-CM

## 2020-04-25 DIAGNOSIS — C3410 Malignant neoplasm of upper lobe, unspecified bronchus or lung: Secondary | ICD-10-CM

## 2020-04-25 LAB — CBC WITH DIFFERENTIAL/PLATELET
Abs Immature Granulocytes: 0.01 10*3/uL (ref 0.00–0.07)
Basophils Absolute: 0 10*3/uL (ref 0.0–0.1)
Basophils Relative: 1 %
Eosinophils Absolute: 0.2 10*3/uL (ref 0.0–0.5)
Eosinophils Relative: 4 %
HCT: 39 % (ref 39.0–52.0)
Hemoglobin: 13.5 g/dL (ref 13.0–17.0)
Immature Granulocytes: 0 %
Lymphocytes Relative: 21 %
Lymphs Abs: 0.8 10*3/uL (ref 0.7–4.0)
MCH: 31.2 pg (ref 26.0–34.0)
MCHC: 34.6 g/dL (ref 30.0–36.0)
MCV: 90.1 fL (ref 80.0–100.0)
Monocytes Absolute: 0.6 10*3/uL (ref 0.1–1.0)
Monocytes Relative: 16 %
Neutro Abs: 2.3 10*3/uL (ref 1.7–7.7)
Neutrophils Relative %: 58 %
Platelets: 207 10*3/uL (ref 150–400)
RBC: 4.33 MIL/uL (ref 4.22–5.81)
RDW: 17 % — ABNORMAL HIGH (ref 11.5–15.5)
WBC: 3.9 10*3/uL — ABNORMAL LOW (ref 4.0–10.5)
nRBC: 0 % (ref 0.0–0.2)

## 2020-04-25 LAB — COMPREHENSIVE METABOLIC PANEL
ALT: 19 U/L (ref 0–44)
AST: 23 U/L (ref 15–41)
Albumin: 3.7 g/dL (ref 3.5–5.0)
Alkaline Phosphatase: 66 U/L (ref 38–126)
Anion gap: 9 (ref 5–15)
BUN: 15 mg/dL (ref 8–23)
CO2: 28 mmol/L (ref 22–32)
Calcium: 8.8 mg/dL — ABNORMAL LOW (ref 8.9–10.3)
Chloride: 102 mmol/L (ref 98–111)
Creatinine, Ser: 0.6 mg/dL — ABNORMAL LOW (ref 0.61–1.24)
GFR calc Af Amer: >60 mL/min (ref >60)
GFR calc non Af Amer: >60 mL/min (ref >60)
Glucose, Bld: 102 mg/dL — ABNORMAL HIGH (ref 70–99)
Potassium: 4.2 mmol/L (ref 3.5–5.1)
Sodium: 139 mmol/L (ref 135–145)
Total Bilirubin: 0.6 mg/dL (ref 0.3–1.2)
Total Protein: 6.4 g/dL — ABNORMAL LOW (ref 6.5–8.1)

## 2020-04-25 MED ORDER — HEPARIN SOD (PORK) LOCK FLUSH 100 UNIT/ML IV SOLN
INTRAVENOUS | Status: AC
  Filled 2020-04-25: qty 5

## 2020-04-25 MED ORDER — SODIUM CHLORIDE 0.9 % IV SOLN
200.0000 mg | Freq: Once | INTRAVENOUS | Status: AC
  Administered 2020-04-25: 200 mg via INTRAVENOUS
  Filled 2020-04-25: qty 8

## 2020-04-25 MED ORDER — HEPARIN SOD (PORK) LOCK FLUSH 100 UNIT/ML IV SOLN
500.0000 [IU] | Freq: Once | INTRAVENOUS | Status: DC
  Filled 2020-04-25: qty 5

## 2020-04-25 MED ORDER — HEPARIN SOD (PORK) LOCK FLUSH 100 UNIT/ML IV SOLN
500.0000 [IU] | Freq: Once | INTRAVENOUS | Status: AC | PRN
  Administered 2020-04-25: 500 [IU]
  Filled 2020-04-25: qty 5

## 2020-04-25 MED ORDER — ZOLEDRONIC ACID 4 MG/5ML IV CONC
3.0000 mg | Freq: Once | INTRAVENOUS | Status: AC
  Administered 2020-04-25: 3 mg via INTRAVENOUS
  Filled 2020-04-25: qty 3.75

## 2020-04-25 MED ORDER — SODIUM CHLORIDE 0.9 % IV SOLN
Freq: Once | INTRAVENOUS | Status: AC
  Filled 2020-04-25: qty 250

## 2020-04-25 MED ORDER — SODIUM CHLORIDE 0.9% FLUSH
10.0000 mL | INTRAVENOUS | Status: DC | PRN
  Administered 2020-04-25: 10 mL via INTRAVENOUS
  Filled 2020-04-25: qty 10

## 2020-04-25 NOTE — Progress Notes (Signed)
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   # MAY 26th-Keytrda x2 cycles; [borderline PS] July 7th-carbo Alimta Keytruda cycle #1  # NGS/MOLECULAR TESTS:   # PALLIATIVE CARE EVALUATION:  # PAIN MANAGEMENT:    DIAGNOSIS:   STAGE:         ;  GOALS:  CURRENT/MOST RECENT THERAPY :     Cancer of upper lobe of right lung ([place])  11/23/2019 Initial Diagnosis   Cancer of upper lobe of right lung ([place])   12/21/2019 -  Chemotherapy   The patient had dexamethasone (DECADRON) 4 MG tablet, 1 of 1 cycle, Start date: --, End date: -- palonosetron (ALOXI) injection 0.25 mg, 0.25 mg, Intravenous,  Once, 4 of 4 cycles Administration: 0.25 mg (02/01/2020), 0.25 mg (02/22/2020), 0.25 mg (03/14/2020), 0.25 mg (04/04/2020) PEMEtrexed (ALIMTA) 800 mg in sodium chloride 0.9 % 100 mL chemo infusion, 500 mg/m2 = 800 mg, Intravenous,  Once, 4 of 4 cycles Administration: 800 mg (02/01/2020), 800 mg (02/22/2020), 800 mg (03/14/2020), 800 mg (04/04/2020) CARBOplatin (PARAPLATIN) 330 mg in sodium chloride 0.9 % 250 mL chemo infusion, 330 mg (100 % of original dose 328.8 mg), Intravenous,  Once, 4 of 4 cycles Dose modification:   (original dose 328.8 mg, Cycle 3),   (original dose 328.8 mg, Cycle 5) Administration: 330 mg (02/01/2020), 330 mg (02/22/2020), 330 mg (03/14/2020), 330 mg  (04/04/2020) fosaprepitant (EMEND) 150 mg in sodium chloride 0.9 % 145 mL IVPB, 150 mg, Intravenous,  Once, 4 of 4 cycles Administration: 150 mg (02/01/2020), 150 mg (02/22/2020), 150 mg (03/14/2020), 150 mg (04/04/2020) pembrolizumab (KEYTRUDA) 200 mg in sodium chloride 0.9 % 50 mL chemo infusion, 200 mg, Intravenous, Once, 7 of 10 cycles Administration: 200 mg (12/21/2019), 200 mg (02/01/2020), 200 mg (02/22/2020), 200 mg (03/14/2020), 200 mg (04/04/2020), 200 mg (01/11/2020)  for chemotherapy treatment.     HISTORY OF PRESENTING ILLNESS:  Cody [name] 64 y.o.  male patient with metastatic non-small cell lung cancer; cord compression status post decompressive surgery; currently on Keytruda- Carbo-alimta is here for follow-up.  Patient denies any nausea vomiting.  No diarrhea.  He continues to ambulate with a walker.  No falls.  He continues to be on home O2 2 L  Complains of sinus congestion; stuffy nose.  Otherwise no headaches.  No fevers or chills. Review of Systems  Constitutional: Positive for malaise/fatigue and weight loss. Negative for chills, diaphoresis and fever.  HENT: Negative for nosebleeds and sore throat.   Eyes: Negative for double vision.  Respiratory: Positive for shortness of breath. Negative for cough, hemoptysis, sputum production and wheezing.   Cardiovascular: Negative for chest pain, palpitations, orthopnea and leg swelling.  Gastrointestinal: Negative for abdominal pain, blood in stool, constipation, diarrhea, heartburn, melena, nausea and vomiting.  Genitourinary: Negative for dysuria, frequency and urgency.  Musculoskeletal: Positive for back pain and joint pain.  Skin: Negative.  Negative for itching and rash.  Neurological: Negative for dizziness,  tingling, focal weakness, weakness and headaches.  Endo/Heme/Allergies: Does not bruise/bleed easily.  Psychiatric/Behavioral: Negative for depression. The patient is not nervous/anxious and does not have insomnia.       MEDICAL HISTORY:  Past Medical History:  Diagnosis Date  . Allergy   . COPD (chronic obstructive pulmonary disease) ([place])   . Prostate enlargement     SURGICAL HISTORY: Past Surgical History:  Procedure Laterality Date  . HERNIA REPAIR Bilateral 1982  . HERNIA REPAIR Right 1994  . IR IMAGING GUIDED PORT INSERTION  02/24/2020  . LAMINECTOMY FOR EXCISION / EVACUATION INTRASPINAL LESION  11/07/2019   T1-T 6 at [facility]     SOCIAL HISTORY: Social History   Socioeconomic History  . Marital status: Married    Spouse name: [name]   . Number of children: 4  . Years of education: Not on file  . Highest education level: Some college, no degree  Occupational History  . Occupation: dispatch and delivery   Tobacco Use  . Smoking status: Former Smoker    Packs/day: 2.00    Years: 30.00    Pack years: 60.00    Quit date: 07/29/2011    Years since quitting: 8.7  . Smokeless tobacco: Never Used  . Tobacco comment: pt vapes  Vaping Use  . Vaping Use: Never assessed  Substance and Sexual Activity  . Alcohol use: Yes    Alcohol/week: 5.0 standard drinks    Types: 5 Standard drinks or equivalent per week    Comment: 1-2/day  . Drug use: No  . Sexual activity: Not Currently    Partners: Female  Other Topics Concern  . Not on file  Social History Narrative   Worked in warehouse/ quit smoking in 2015; beer 1/day; in [place]; with wife.    Social Determinants of Health   Financial Resource Strain: Low Risk   . Difficulty of Paying Living Expenses: Not hard at all  Food Insecurity: No Food Insecurity  . Worried About Charity fundraiser in the Last Year: Never true  . Ran Out of Food in the Last Year: Never true  Transportation Needs: No Transportation Needs  . Lack of Transportation (Medical): No  . Lack of Transportation (Non-Medical): No  Physical Activity: Insufficiently Active  . Days of Exercise per Week: 7 days  . Minutes of Exercise per Session: 20 min  Stress: No  Stress Concern Present  . Feeling of Stress : Not at all  Social Connections: Socially Integrated  . Frequency of Communication with Friends and Family: More than three times a week  . Frequency of Social Gatherings with Friends and Family: More than three times a week  . Attends Religious Services: More than 4 times per year  . Active Member of Clubs or Organizations: Yes  . Attends Archivist Meetings: More than 4 times per year  . Marital Status: Married  Human resources officer Violence: Not At Risk  . Fear of Current or Ex-Partner: No  . Emotionally Abused: No  . Physically Abused: No  . Sexually Abused: No    FAMILY HISTORY: Family History  Problem Relation Age of Onset  . Heart disease Father   . CVA Mother   . Lung cancer Sister   . Lung cancer Brother     ALLERGIES:  has No Known Allergies.  MEDICATIONS:  Current Outpatient Medications  Medication Sig Dispense Refill  . acetaminophen (TYLENOL) 500 MG tablet Take 500-1,000 mg by mouth every 6 (six) hours as needed for mild  pain or fever.     Marland Kitchen albuterol (VENTOLIN HFA) 108 (90 Base) MCG/ACT inhaler Inhale 2 puffs into the lungs every 4 (four) hours as needed for wheezing or shortness of breath. 18 g 0  . Calcium Carb-Cholecalciferol (OYSTER SHELL CALCIUM) 500-400 MG-UNIT TABS Take 500 tablets by mouth.    . escitalopram (LEXAPRO) 5 MG tablet Take 1 tablet (5 mg total) by mouth daily. am's 90 tablet 1  . Fluticasone-Umeclidin-Vilant (TRELEGY ELLIPTA) 100-62.5-25 MCG/INH AEPB Inhale 1 puff into the lungs daily. 563 each 1  . folic acid (FOLVITE) 1 MG tablet Take 1 tablet (1 mg total) by mouth daily. 90 tablet 1  . lidocaine-prilocaine (EMLA) cream Apply 1 application topically as needed. Apply small amount to port site at least 1 hour prior to it being accessed, cover with plastic wrap 30 g 1  . Multiple Vitamins-Minerals (MULTIVITAMIN GUMMIES MENS PO) Take 1 Dose by mouth daily.     . QUEtiapine (SEROQUEL) 25 MG tablet  Take 1 tablet (25 mg total) by mouth at bedtime. 90 tablet 1  . ondansetron (ZOFRAN) 4 MG tablet Take 1 tablet (4 mg total) by mouth every 8 (eight) hours as needed for nausea or vomiting. (Patient not taking: Reported on 04/25/2020) 20 tablet 2  . prochlorperazine (COMPAZINE) 10 MG tablet Take 1 tablet (10 mg total) by mouth every 6 (six) hours as needed for nausea or vomiting. (Patient not taking: Reported on 04/25/2020) 40 tablet 1   No current facility-administered medications for this visit.   Facility-Administered Medications Ordered in Other Visits  Medication Dose Route Frequency Provider Last Rate Last Admin  . 0.9 %  sodium chloride infusion   Intravenous Once Charlaine Dalton R, MD      . heparin lock flush 100 unit/mL  500 Units Intravenous Once Charlaine Dalton R, MD      . heparin lock flush 100 unit/mL  500 Units Intracatheter Once PRN Cammie Sickle, MD      . pembrolizumab [facility]) 200 mg in sodium chloride 0.9 % 50 mL chemo infusion  200 mg Intravenous Once Charlaine Dalton R, MD      . sodium chloride flush (NS) 0.9 % injection 10 mL  10 mL Intravenous PRN Charlaine Dalton R, MD   10 mL at 04/25/20 0839  . zolendronic acid (ZOMETA) 3 mg in sodium chloride 0.9 % 100 mL IVPB  3 mg Intravenous Once Charlaine Dalton R, MD          .  PHYSICAL EXAMINATION: ECOG PERFORMANCE STATUS: 3 - Symptomatic, >50% confined to bed  Vitals:   04/25/20 0850  BP: 114/82  Pulse: 71  Resp: (!) 21  Temp: (!) 97.2 F (36.2 C)  SpO2: 100%   Filed Weights   04/25/20 0850  Weight: 127 lb 12.8 oz (58 kg)    Physical Exam Constitutional:      Comments: Thin built frail appearing male patient.  No acute distress.  HEENT negative.  Accompanied by his daughter  HENT:     Head: Normocephalic and atraumatic.     Mouth/Throat:     Pharynx: No oropharyngeal exudate.  Eyes:     Pupils: Pupils are equal, round, and reactive to light.  Cardiovascular:     Rate and  Rhythm: Normal rate and regular rhythm.  Pulmonary:     Effort: No respiratory distress.     Breath sounds: No wheezing.     Comments: Decreased air entry bilaterally. Abdominal:     General: Bowel  sounds are normal. There is no distension.     Palpations: Abdomen is soft. There is no mass.     Tenderness: There is no abdominal tenderness. There is no guarding or rebound.  Musculoskeletal:        General: No tenderness. Normal range of motion.     Cervical back: Normal range of motion and neck supple.  Skin:    General: Skin is warm.  Neurological:     Mental Status: He is alert and oriented to person, place, and time.  Psychiatric:        Mood and Affect: Affect normal.      LABORATORY DATA:  I have reviewed the data as listed Lab Results  Component Value Date   WBC 3.9 (L) 04/25/2020   HGB 13.5 04/25/2020   HCT 39.0 04/25/2020   MCV 90.1 04/25/2020   PLT 207 04/25/2020   Recent Labs    03/14/20 0831 04/04/20 0827 04/25/20 0834  NA 139 137 139  K 3.9 4.3 4.2  CL 103 99 102  CO2 _0 GLUCOSE 104* 181* 102*  BUN _1 CREATININE 0.56* 0.61 0.60*  CALCIUM 8.8* 8.8* 8.8*  GFRNONAA >60 >60 >60  GFRAA >60 >60 >60  PROT 6.9 6.7 6.4*  ALBUMIN 3.8 3.7 3.7  AST _2 ALT _3 ALKPHOS 82 77 66  BILITOT 0.5 0.6 0.6    RADIOGRAPHIC STUDIES: I have personally reviewed the radiological images as listed and agreed with the findings in the report. VAS US AORTA MEDICARE SCREEN  Result Date: 04/19/2020 ABDOMINAL AORTA STUDY Indications: medicare screening for AAA. Patient denies any abdominal pain and              no family history of AAA. Risk Factors: Past history of smoking.  Performing Technologist: Wilkie Aye RVT  Examination Guidelines: A complete evaluation includes B-mode imaging, spectral Doppler, color Doppler, and power Doppler as needed of all accessible portions of each vessel. Bilateral testing is considered an integral part of a complete  examination. Limited examinations for reoccurring indications may be performed as noted.  Abdominal Aorta Findings: +-----------+-------+----------+----------+--------+--------+--------+ Location   AP (cm)Trans (cm)PSV (cm/s)WaveformThrombusComments +-----------+-------+----------+----------+--------+--------+--------+ Proximal   1.90   1.80      59        biphasic                 +-----------+-------+----------+----------+--------+--------+--------+ Mid        1.90   2.00      49        biphasic                 +-----------+-------+----------+----------+--------+--------+--------+ Distal     1.70   1.70      43        biphasic                 +-----------+-------+----------+----------+--------+--------+--------+ RT CIA Prox1.1    1.1       81        biphasic                 +-----------+-------+----------+----------+--------+--------+--------+ RT EIA Prox0.8    0.6       63        biphasic                 +-----------+-------+----------+----------+--------+--------+--------+ LT CIA Prox1.2    1.1       74  biphasic                 +-----------+-------+----------+----------+--------+--------+--------+ LT EIA Prox0.9    1.0       66        biphasic                 +-----------+-------+----------+----------+--------+--------+--------+ IVC/Iliac Findings: +--------+------+   IVC   Patent +--------+------+ IVC Proxpatent +--------+------+   Summary: Abdominal Aorta: No evidence of an abdominal aortic aneurysm was visualized. The largest aortic measurement is 2.0 cm. IVC/Iliac: There is no evidence of thrombus involving the IVC.  *See table(s) above for measurements and observations.  Electronically signed by Carlyle Dolly MD on 04/19/2020 at 3:11:40 PM.   Final     ASSESSMENT & PLAN:   Cancer of upper lobe of right lung ([place]) # Stage IV metastatic non-small cell lung cancer favor adeno; mets to bone; liver.  Currently on Keytruda+Carbo  [AUC-4]+Alimta. CT scan July 27th-partial response [stable lung lesion; improved para spinal mass; improvement of the liver lesions]. STABLE.   # proceed with maintenance [place]; Labs today reviewed;  acceptable for treatment today.  #Bone metastases- on zometa 3 mg IVPB.  Every 6 weeks. Ca-8.8; on  ca+ vit D BID; STABLE; take extra Ca+vitD.   #Malnutrition/cachexia-continue weight loss/underlying malignancy.  S/p nutrition evaluation- STABLE   # COPD- STABLE;-Trelegy.  # Insomnia-continue Seroquel.stable  #Debility-spinal cord compression/status post surgery-on physical therapy--STABLE  # "Head cold"- sinus congestin; add claritin/ nasal saline for dryness from [facility].   # DISPOSITION: # Keytruda today;Zometa today # follow up in 3 weeks-  MD;labs- cbc/cmp; Keytruda; CT C/A/P prior-  Dr.B   All questions were answered. The patient knows to call the clinic with any problems, questions or concerns.    Cammie Sickle, MD 04/25/2020 9:39 AM

## 2020-04-25 NOTE — Progress Notes (Signed)
Pt and daughter in for follow up, reports pt staying alone and doing all of his own ADL's now.  Improved appetite. Pt does reports some sinus and chest congestion that is new.

## 2020-04-25 NOTE — Assessment & Plan Note (Addendum)
#   Stage IV metastatic non-small cell lung cancer favor adeno; mets to bone; liver.  Currently on Keytruda+Carbo [AUC-4]+Alimta. CT scan July 27th-partial response [stable lung lesion; improved para spinal mass; improvement of the liver lesions]. STABLE.   # proceed with maintenance [place]; Labs today reviewed;  acceptable for treatment today.  We will repeat imaging CT chest and pelvis prior to next visit.  #Bone metastases- on zometa 3 mg IVPB.  Every 6 weeks. Ca-8.8; on  ca+ vit D BID; STABLE; take extra Ca+vitD.   #Malnutrition/cachexia-continue weight loss/underlying malignancy.  S/p nutrition evaluation- STABLE   # COPD- STABLE;-Trelegy.  # Insomnia-continue Seroquel.stable  #Debility-spinal cord compression/status post surgery-on physical therapy--STABLE  # "sinus congestion"- nasal saline for dryness from [facility]. ; recommend Vaseline/nasal saline spray.   # DISPOSITION: # Keytruda today;Zometa today # follow up in 3 weeks-  MD;labs- cbc/cmp; Keytruda; CT C/A/P prior-  Dr.B

## 2020-04-30 ENCOUNTER — Other Ambulatory Visit: Payer: Self-pay

## 2020-04-30 ENCOUNTER — Telehealth: Payer: Self-pay | Admitting: Internal Medicine

## 2020-04-30 ENCOUNTER — Ambulatory Visit
Admission: RE | Admit: 2020-04-30 | Discharge: 2020-04-30 | Disposition: A | Discharge status: Home / Self Care | Payer: Medicare HMO | Source: Ambulatory Visit | Attending: Internal Medicine | Admitting: Internal Medicine

## 2020-04-30 DIAGNOSIS — J439 Emphysema, unspecified: Secondary | ICD-10-CM | POA: Diagnosis not present

## 2020-04-30 DIAGNOSIS — I700 Atherosclerosis of aorta: Secondary | ICD-10-CM | POA: Diagnosis not present

## 2020-04-30 DIAGNOSIS — C3411 Malignant neoplasm of upper lobe, right bronchus or lung: Secondary | ICD-10-CM | POA: Insufficient documentation

## 2020-04-30 DIAGNOSIS — L989 Disorder of the skin and subcutaneous tissue, unspecified: Secondary | ICD-10-CM | POA: Diagnosis not present

## 2020-04-30 DIAGNOSIS — Z981 Arthrodesis status: Secondary | ICD-10-CM | POA: Diagnosis not present

## 2020-04-30 DIAGNOSIS — R531 Weakness: Secondary | ICD-10-CM | POA: Diagnosis not present

## 2020-04-30 DIAGNOSIS — C787 Secondary malignant neoplasm of liver and intrahepatic bile duct: Secondary | ICD-10-CM | POA: Diagnosis not present

## 2020-04-30 MED ORDER — IOHEXOL 300 MG/ML  SOLN
100.0000 mL | Freq: Once | INTRAMUSCULAR | Status: AC | PRN
  Administered 2020-04-30: 100 mL via INTRAVENOUS

## 2020-04-30 NOTE — Telephone Encounter (Signed)
On 10/04-spoke to patient regarding results of the CT scan-improvement noted on chemotherapy.  Continue current treatment plan follow-up as planned

## 2020-05-01 DIAGNOSIS — J441 Chronic obstructive pulmonary disease with (acute) exacerbation: Secondary | ICD-10-CM | POA: Diagnosis not present

## 2020-05-01 DIAGNOSIS — J9601 Acute respiratory failure with hypoxia: Secondary | ICD-10-CM | POA: Diagnosis not present

## 2020-05-02 ENCOUNTER — Inpatient Hospital Stay: Payer: Medicare HMO | Attending: Hospice and Palliative Medicine | Admitting: Hospice and Palliative Medicine

## 2020-05-02 ENCOUNTER — Other Ambulatory Visit: Payer: Self-pay

## 2020-05-02 DIAGNOSIS — C7951 Secondary malignant neoplasm of bone: Secondary | ICD-10-CM | POA: Insufficient documentation

## 2020-05-02 DIAGNOSIS — N400 Enlarged prostate without lower urinary tract symptoms: Secondary | ICD-10-CM | POA: Insufficient documentation

## 2020-05-02 DIAGNOSIS — E46 Unspecified protein-calorie malnutrition: Secondary | ICD-10-CM | POA: Insufficient documentation

## 2020-05-02 DIAGNOSIS — Z801 Family history of malignant neoplasm of trachea, bronchus and lung: Secondary | ICD-10-CM | POA: Insufficient documentation

## 2020-05-02 DIAGNOSIS — M2550 Pain in unspecified joint: Secondary | ICD-10-CM | POA: Insufficient documentation

## 2020-05-02 DIAGNOSIS — M549 Dorsalgia, unspecified: Secondary | ICD-10-CM | POA: Insufficient documentation

## 2020-05-02 DIAGNOSIS — Z5112 Encounter for antineoplastic immunotherapy: Secondary | ICD-10-CM | POA: Insufficient documentation

## 2020-05-02 DIAGNOSIS — Z79899 Other long term (current) drug therapy: Secondary | ICD-10-CM | POA: Insufficient documentation

## 2020-05-02 DIAGNOSIS — C787 Secondary malignant neoplasm of liver and intrahepatic bile duct: Secondary | ICD-10-CM | POA: Insufficient documentation

## 2020-05-02 DIAGNOSIS — G9520 Unspecified cord compression: Secondary | ICD-10-CM | POA: Insufficient documentation

## 2020-05-02 DIAGNOSIS — G4700 Insomnia, unspecified: Secondary | ICD-10-CM | POA: Insufficient documentation

## 2020-05-02 DIAGNOSIS — Z515 Encounter for palliative care: Secondary | ICD-10-CM

## 2020-05-02 DIAGNOSIS — I700 Atherosclerosis of aorta: Secondary | ICD-10-CM | POA: Insufficient documentation

## 2020-05-02 DIAGNOSIS — R0602 Shortness of breath: Secondary | ICD-10-CM | POA: Insufficient documentation

## 2020-05-02 DIAGNOSIS — R64 Cachexia: Secondary | ICD-10-CM | POA: Insufficient documentation

## 2020-05-02 DIAGNOSIS — C3411 Malignant neoplasm of upper lobe, right bronchus or lung: Secondary | ICD-10-CM | POA: Insufficient documentation

## 2020-05-02 DIAGNOSIS — R0989 Other specified symptoms and signs involving the circulatory and respiratory systems: Secondary | ICD-10-CM | POA: Insufficient documentation

## 2020-05-02 DIAGNOSIS — Z8249 Family history of ischemic heart disease and other diseases of the circulatory system: Secondary | ICD-10-CM | POA: Insufficient documentation

## 2020-05-02 DIAGNOSIS — R5383 Other fatigue: Secondary | ICD-10-CM | POA: Insufficient documentation

## 2020-05-02 DIAGNOSIS — Z7289 Other problems related to lifestyle: Secondary | ICD-10-CM | POA: Insufficient documentation

## 2020-05-02 DIAGNOSIS — R0981 Nasal congestion: Secondary | ICD-10-CM | POA: Insufficient documentation

## 2020-05-02 DIAGNOSIS — Z87891 Personal history of nicotine dependence: Secondary | ICD-10-CM | POA: Insufficient documentation

## 2020-05-02 NOTE — Progress Notes (Signed)
Was unable to reach patient for scheduled virtual visit.  Voicemail left.  Will reschedule.

## 2020-05-07 ENCOUNTER — Inpatient Hospital Stay: Payer: Medicare HMO

## 2020-05-07 ENCOUNTER — Other Ambulatory Visit: Payer: Self-pay

## 2020-05-07 NOTE — Progress Notes (Signed)
Nutrition Follow-up:  Patient with lung cancer, liver mets, spinal cord compression/vertebral mets.  Patient receiving [place].    Spoke with patient via phone for nutrition follow-up.  Patient reports that his appetite is good.  He continues to drink ensure enlive shakes.  Denies any nutrition impact symptoms at this time.      Medications: reviewed  Labs: reviewed  Anthropometrics:   Weight 127 lb 12.8 oz on 9/29 increased from 126 lb on 8/18 124 lb on 7/16 118 lb on 6/16 115 lb on 6/3   NUTRITION DIAGNOSIS: Inadequate oral intake improved   INTERVENTION:  Will leave another case of ensure enlive at registration desk for patient to pick up Continue high calorie, high protein foods to prevent weight loss    MONITORING, EVALUATION, GOAL: weight trends, intake   NEXT VISIT: November 29 phone f/u  Torria Fromer B. Zenia Resides, [place], [place] Registered Dietitian [phone] (mobile)

## 2020-05-16 ENCOUNTER — Other Ambulatory Visit: Payer: Self-pay

## 2020-05-16 ENCOUNTER — Encounter: Payer: Self-pay | Admitting: Internal Medicine

## 2020-05-16 ENCOUNTER — Inpatient Hospital Stay: Payer: Medicare HMO

## 2020-05-16 ENCOUNTER — Inpatient Hospital Stay (HOSPITAL_BASED_OUTPATIENT_CLINIC_OR_DEPARTMENT_OTHER): Payer: Medicare HMO | Admitting: Internal Medicine

## 2020-05-16 DIAGNOSIS — Z5112 Encounter for antineoplastic immunotherapy: Secondary | ICD-10-CM | POA: Diagnosis present

## 2020-05-16 DIAGNOSIS — R0981 Nasal congestion: Secondary | ICD-10-CM | POA: Diagnosis not present

## 2020-05-16 DIAGNOSIS — R0989 Other specified symptoms and signs involving the circulatory and respiratory systems: Secondary | ICD-10-CM | POA: Diagnosis not present

## 2020-05-16 DIAGNOSIS — C7951 Secondary malignant neoplasm of bone: Secondary | ICD-10-CM | POA: Diagnosis not present

## 2020-05-16 DIAGNOSIS — R0602 Shortness of breath: Secondary | ICD-10-CM | POA: Diagnosis not present

## 2020-05-16 DIAGNOSIS — E46 Unspecified protein-calorie malnutrition: Secondary | ICD-10-CM | POA: Diagnosis not present

## 2020-05-16 DIAGNOSIS — Z801 Family history of malignant neoplasm of trachea, bronchus and lung: Secondary | ICD-10-CM | POA: Diagnosis not present

## 2020-05-16 DIAGNOSIS — R5383 Other fatigue: Secondary | ICD-10-CM | POA: Diagnosis not present

## 2020-05-16 DIAGNOSIS — Z87891 Personal history of nicotine dependence: Secondary | ICD-10-CM | POA: Diagnosis not present

## 2020-05-16 DIAGNOSIS — C3411 Malignant neoplasm of upper lobe, right bronchus or lung: Secondary | ICD-10-CM

## 2020-05-16 DIAGNOSIS — Z7189 Other specified counseling: Secondary | ICD-10-CM

## 2020-05-16 DIAGNOSIS — C787 Secondary malignant neoplasm of liver and intrahepatic bile duct: Secondary | ICD-10-CM | POA: Diagnosis not present

## 2020-05-16 DIAGNOSIS — Z79899 Other long term (current) drug therapy: Secondary | ICD-10-CM | POA: Diagnosis not present

## 2020-05-16 DIAGNOSIS — N400 Enlarged prostate without lower urinary tract symptoms: Secondary | ICD-10-CM | POA: Diagnosis not present

## 2020-05-16 DIAGNOSIS — R64 Cachexia: Secondary | ICD-10-CM | POA: Diagnosis not present

## 2020-05-16 DIAGNOSIS — I700 Atherosclerosis of aorta: Secondary | ICD-10-CM | POA: Diagnosis not present

## 2020-05-16 DIAGNOSIS — G9520 Unspecified cord compression: Secondary | ICD-10-CM | POA: Diagnosis not present

## 2020-05-16 DIAGNOSIS — M549 Dorsalgia, unspecified: Secondary | ICD-10-CM | POA: Diagnosis not present

## 2020-05-16 DIAGNOSIS — Z8249 Family history of ischemic heart disease and other diseases of the circulatory system: Secondary | ICD-10-CM | POA: Diagnosis not present

## 2020-05-16 DIAGNOSIS — Z7289 Other problems related to lifestyle: Secondary | ICD-10-CM | POA: Diagnosis not present

## 2020-05-16 DIAGNOSIS — G4700 Insomnia, unspecified: Secondary | ICD-10-CM | POA: Diagnosis not present

## 2020-05-16 DIAGNOSIS — M2550 Pain in unspecified joint: Secondary | ICD-10-CM | POA: Diagnosis not present

## 2020-05-16 LAB — CBC WITH DIFFERENTIAL/PLATELET
Abs Immature Granulocytes: 0.07 10*3/uL (ref 0.00–0.07)
Basophils Absolute: 0.1 10*3/uL (ref 0.0–0.1)
Basophils Relative: 1 %
Eosinophils Absolute: 0.3 10*3/uL (ref 0.0–0.5)
Eosinophils Relative: 6 %
HCT: 42.2 % (ref 39.0–52.0)
Hemoglobin: 14.5 g/dL (ref 13.0–17.0)
Immature Granulocytes: 1 %
Lymphocytes Relative: 17 %
Lymphs Abs: 0.8 10*3/uL (ref 0.7–4.0)
MCH: 31.7 pg (ref 26.0–34.0)
MCHC: 34.4 g/dL (ref 30.0–36.0)
MCV: 92.1 fL (ref 80.0–100.0)
Monocytes Absolute: 0.5 10*3/uL (ref 0.1–1.0)
Monocytes Relative: 11 %
Neutro Abs: 3.1 10*3/uL (ref 1.7–7.7)
Neutrophils Relative %: 64 %
Platelets: 201 10*3/uL (ref 150–400)
RBC: 4.58 MIL/uL (ref 4.22–5.81)
RDW: 14.8 % (ref 11.5–15.5)
WBC: 4.9 10*3/uL (ref 4.0–10.5)
nRBC: 0 % (ref 0.0–0.2)

## 2020-05-16 LAB — COMPREHENSIVE METABOLIC PANEL
ALT: 19 U/L (ref 0–44)
AST: 28 U/L (ref 15–41)
Albumin: 3.8 g/dL (ref 3.5–5.0)
Alkaline Phosphatase: 71 U/L (ref 38–126)
Anion gap: 10 (ref 5–15)
BUN: 18 mg/dL (ref 8–23)
CO2: 30 mmol/L (ref 22–32)
Calcium: 9 mg/dL (ref 8.9–10.3)
Chloride: 98 mmol/L (ref 98–111)
Creatinine, Ser: 0.56 mg/dL — ABNORMAL LOW (ref 0.61–1.24)
GFR, Estimated: >60 mL/min (ref >60)
Glucose, Bld: 176 mg/dL — ABNORMAL HIGH (ref 70–99)
Potassium: 3.9 mmol/L (ref 3.5–5.1)
Sodium: 138 mmol/L (ref 135–145)
Total Bilirubin: 1 mg/dL (ref 0.3–1.2)
Total Protein: 6.9 g/dL (ref 6.5–8.1)

## 2020-05-16 MED ORDER — SODIUM CHLORIDE 0.9 % IV SOLN
Freq: Once | INTRAVENOUS | Status: AC
  Filled 2020-05-16: qty 250

## 2020-05-16 MED ORDER — HEPARIN SOD (PORK) LOCK FLUSH 100 UNIT/ML IV SOLN
500.0000 [IU] | Freq: Once | INTRAVENOUS | Status: AC | PRN
  Administered 2020-05-16: 500 [IU]
  Filled 2020-05-16: qty 5

## 2020-05-16 MED ORDER — HEPARIN SOD (PORK) LOCK FLUSH 100 UNIT/ML IV SOLN
INTRAVENOUS | Status: AC
  Filled 2020-05-16: qty 5

## 2020-05-16 MED ORDER — SODIUM CHLORIDE 0.9 % IV SOLN
200.0000 mg | Freq: Once | INTRAVENOUS | Status: AC
  Administered 2020-05-16: 200 mg via INTRAVENOUS
  Filled 2020-05-16: qty 8

## 2020-05-16 NOTE — Progress Notes (Signed)
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   # MAY 26th-Keytrda x2 cycles; [borderline PS] July 7th-carbo Alimta Keytruda cycle #1  # NGS/MOLECULAR TESTS:   # PALLIATIVE CARE EVALUATION:  # PAIN MANAGEMENT:    DIAGNOSIS:   STAGE:         ;  GOALS:  CURRENT/MOST RECENT THERAPY :     Cancer of upper lobe of right lung ([place])  11/23/2019 Initial Diagnosis   Cancer of upper lobe of right lung ([place])   12/21/2019 -  Chemotherapy   The patient had dexamethasone (DECADRON) 4 MG tablet, 1 of 1 cycle, Start date: --, End date: -- palonosetron (ALOXI) injection 0.25 mg, 0.25 mg, Intravenous,  Once, 4 of 4 cycles Administration: 0.25 mg (02/01/2020), 0.25 mg (02/22/2020), 0.25 mg (03/14/2020), 0.25 mg (04/04/2020) PEMEtrexed (ALIMTA) 800 mg in sodium chloride 0.9 % 100 mL chemo infusion, 500 mg/m2 = 800 mg, Intravenous,  Once, 4 of 4 cycles Administration: 800 mg (02/01/2020), 800 mg (02/22/2020), 800 mg (03/14/2020), 800 mg (04/04/2020) CARBOplatin (PARAPLATIN) 330 mg in sodium chloride 0.9 % 250 mL chemo infusion, 330 mg (100 % of original dose 328.8 mg), Intravenous,  Once, 4 of 4 cycles Dose modification:   (original dose 328.8 mg, Cycle 3),   (original dose 328.8 mg, Cycle 5) Administration: 330 mg (02/01/2020), 330 mg (02/22/2020), 330 mg (03/14/2020), 330 mg  (04/04/2020) fosaprepitant (EMEND) 150 mg in sodium chloride 0.9 % 145 mL IVPB, 150 mg, Intravenous,  Once, 4 of 4 cycles Administration: 150 mg (02/01/2020), 150 mg (02/22/2020), 150 mg (03/14/2020), 150 mg (04/04/2020) pembrolizumab (KEYTRUDA) 200 mg in sodium chloride 0.9 % 50 mL chemo infusion, 200 mg, Intravenous, Once, 8 of 10 cycles Administration: 200 mg (12/21/2019), 200 mg (02/01/2020), 200 mg (02/22/2020), 200 mg (03/14/2020), 200 mg (04/04/2020), 200 mg (01/11/2020), 200 mg (04/25/2020)  for chemotherapy treatment.     HISTORY OF PRESENTING ILLNESS:  Cody [name] 64 y.o.  male patient with metastatic non-small cell lung cancer; cord compression status post decompressive surgery; currently on Keytruda-is here for follow-up/review results of the CT scan  Patient denies any nausea vomiting diarrhea.  He continues to ambulate with a walker at home.  He continues to use O2 nasal cannula at 2 L.  Stuffy nose runny nose improved.  No headaches.  No fevers or chills. . Review of Systems  Constitutional: Positive for malaise/fatigue. Negative for chills, diaphoresis and fever.  HENT: Negative for nosebleeds and sore throat.   Eyes: Negative for double vision.  Respiratory: Positive for shortness of breath. Negative for cough, hemoptysis, sputum production and wheezing.   Cardiovascular: Negative for chest pain, palpitations, orthopnea and leg swelling.  Gastrointestinal: Negative for abdominal pain, blood in stool, constipation, diarrhea, heartburn, melena, nausea and vomiting.  Genitourinary: Negative for dysuria, frequency and urgency.  Musculoskeletal: Positive for back pain and joint pain.  Skin: Negative.  Negative for itching and rash.  Neurological: Negative for dizziness,  tingling, focal weakness, weakness and headaches.  Endo/Heme/Allergies: Does not bruise/bleed easily.  Psychiatric/Behavioral: Negative for depression. The patient is not nervous/anxious and does not have insomnia.       MEDICAL HISTORY:  Past Medical History:  Diagnosis Date  . Allergy   . Cancer of upper lobe of right lung ([place]) 10/2019  . COPD (chronic obstructive pulmonary disease) ([place])   . Prostate enlargement     SURGICAL HISTORY: Past Surgical History:  Procedure Laterality Date  . HERNIA REPAIR Bilateral 1982  . HERNIA REPAIR Right 1994  . IR IMAGING GUIDED PORT INSERTION  02/24/2020  . LAMINECTOMY FOR EXCISION / EVACUATION INTRASPINAL LESION  11/07/2019   T1-T 6 at [facility]     SOCIAL HISTORY: Social History   Socioeconomic History  . Marital status: Married    Spouse name: [name]   . Number of children: 4  . Years of education: Not on file  . Highest education level: Some college, no degree  Occupational History  . Occupation: dispatch and delivery   Tobacco Use  . Smoking status: Former Smoker    Packs/day: 2.00    Years: 30.00    Pack years: 60.00    Quit date: 07/29/2011    Years since quitting: 8.8  . Smokeless tobacco: Never Used  . Tobacco comment: pt vapes  Vaping Use  . Vaping Use: Never assessed  Substance and Sexual Activity  . Alcohol use: Yes    Alcohol/week: 5.0 standard drinks    Types: 5 Standard drinks or equivalent per week    Comment: 1-2/day  . Drug use: No  . Sexual activity: Not Currently    Partners: Female  Other Topics Concern  . Not on file  Social History Narrative   Worked in warehouse/ quit smoking in 2015; beer 1/day; in [place]; with wife.    Social Determinants of Health   Financial Resource Strain: Low Risk   . Difficulty of Paying Living Expenses: Not hard at all  Food Insecurity: No Food Insecurity  . Worried About Charity fundraiser in the Last Year: Never true  . Ran Out of Food in the Last Year: Never true  Transportation Needs: No Transportation Needs  . Lack of Transportation (Medical): No  . Lack of Transportation (Non-Medical): No  Physical Activity: Insufficiently Active  . Days of Exercise per Week: 7 days  .  Minutes of Exercise per Session: 20 min  Stress: No Stress Concern Present  . Feeling of Stress : Not at all  Social Connections: Socially Integrated  . Frequency of Communication with Friends and Family: More than three times a week  . Frequency of Social Gatherings with Friends and Family: More than three times a week  . Attends Religious Services: More than 4 times per year  . Active Member of Clubs or Organizations: Yes  . Attends Archivist Meetings: More than 4 times per year  . Marital Status: Married  Human resources officer Violence: Not At Risk  . Fear of Current or Ex-Partner: No  . Emotionally Abused: No  . Physically Abused: No  . Sexually Abused: No    FAMILY HISTORY: Family History  Problem Relation Age of Onset  . Heart disease Father   . CVA Mother   . Lung cancer Sister   . Lung cancer Brother     ALLERGIES:  has No Known Allergies.  MEDICATIONS:  Current Outpatient Medications  Medication Sig Dispense Refill  . acetaminophen (TYLENOL) 500 MG tablet Take 500-1,000  mg by mouth every 6 (six) hours as needed for mild pain or fever.     Marland Kitchen albuterol (VENTOLIN HFA) 108 (90 Base) MCG/ACT inhaler Inhale 2 puffs into the lungs every 4 (four) hours as needed for wheezing or shortness of breath. 18 g 0  . Calcium Carb-Cholecalciferol (OYSTER SHELL CALCIUM) 500-400 MG-UNIT TABS Take 500 tablets by mouth.    . escitalopram (LEXAPRO) 5 MG tablet Take 1 tablet (5 mg total) by mouth daily. am's 90 tablet 1  . Fluticasone-Umeclidin-Vilant (TRELEGY ELLIPTA) 100-62.5-25 MCG/INH AEPB Inhale 1 puff into the lungs daily. 180 each 1  . lidocaine-prilocaine (EMLA) cream Apply 1 application topically as needed. Apply small amount to port site at least 1 hour prior to it being accessed, cover with plastic wrap 30 g 1  . Multiple Vitamins-Minerals (MULTIVITAMIN GUMMIES MENS PO) Take 1 Dose by mouth daily.     . QUEtiapine (SEROQUEL) 25 MG tablet Take 1 tablet (25 mg total) by mouth at  bedtime. 90 tablet 1  . ondansetron (ZOFRAN) 4 MG tablet Take 1 tablet (4 mg total) by mouth every 8 (eight) hours as needed for nausea or vomiting. (Patient not taking: Reported on 04/25/2020) 20 tablet 2  . prochlorperazine (COMPAZINE) 10 MG tablet Take 1 tablet (10 mg total) by mouth every 6 (six) hours as needed for nausea or vomiting. (Patient not taking: Reported on 04/25/2020) 40 tablet 1   No current facility-administered medications for this visit.   Facility-Administered Medications Ordered in Other Visits  Medication Dose Route Frequency Provider Last Rate Last Admin  . heparin lock flush 100 unit/mL  500 Units Intracatheter Once PRN Cammie Sickle, MD      . pembrolizumab [facility]) 200 mg in sodium chloride 0.9 % 50 mL chemo infusion  200 mg Intravenous Once Cammie Sickle, MD 116 mL/hr at 05/16/20 1009 200 mg at 05/16/20 1009      .  PHYSICAL EXAMINATION: ECOG PERFORMANCE STATUS: 3 - Symptomatic, >50% confined to bed  Vitals:   05/16/20 0835  BP: 118/83  Pulse: 92  Resp: 16  Temp: 97.8 F (36.6 C)  SpO2: 100%   Filed Weights   05/16/20 0835  Weight: 124 lb (56.2 kg)    Physical Exam Constitutional:      Comments: Thin built frail appearing male patient.  No acute distress.  HEENT negative.  Accompanied by his daughter  HENT:     Head: Normocephalic and atraumatic.     Mouth/Throat:     Pharynx: No oropharyngeal exudate.  Eyes:     Pupils: Pupils are equal, round, and reactive to light.  Cardiovascular:     Rate and Rhythm: Normal rate and regular rhythm.  Pulmonary:     Effort: No respiratory distress.     Breath sounds: No wheezing.     Comments: Decreased air entry bilaterally. Abdominal:     General: Bowel sounds are normal. There is no distension.     Palpations: Abdomen is soft. There is no mass.     Tenderness: There is no abdominal tenderness. There is no guarding or rebound.  Musculoskeletal:        General: No tenderness. Normal  range of motion.     Cervical back: Normal range of motion and neck supple.  Skin:    General: Skin is warm.  Neurological:     Mental Status: He is alert and oriented to person, place, and time.  Psychiatric:        Mood and  Affect: Affect normal.      LABORATORY DATA:  I have reviewed the data as listed Lab Results  Component Value Date   WBC 4.9 05/16/2020   HGB 14.5 05/16/2020   HCT 42.2 05/16/2020   MCV 92.1 05/16/2020   PLT 201 05/16/2020   Recent Labs    03/14/20 0831 03/14/20 0831 04/04/20 0827 04/25/20 0834 05/16/20 0824  NA 139   < > 137 139 138  K 3.9   < > 4.3 4.2 3.9  CL 103   < > 99 102 98  CO2 29   < > _0 GLUCOSE 104*   < > 181* 102* 176*  BUN 19   < > _1 CREATININE 0.56*   < > 0.61 0.60* 0.56*  CALCIUM 8.8*   < > 8.8* 8.8* 9.0  GFRNONAA >60   < > >60 >60 >60  GFRAA >60  --  >60 >60  --   PROT 6.9   < > 6.7 6.4* 6.9  ALBUMIN 3.8   < > 3.7 3.7 3.8  AST 24   < > _2 ALT 20   < > _3 ALKPHOS 82   < > 77 66 71  BILITOT 0.5   < > 0.6 0.6 1.0   < > = values in this interval not displayed.    RADIOGRAPHIC STUDIES: I have personally reviewed the radiological images as listed and agreed with the findings in the report. CT CHEST ABDOMEN PELVIS W CONTRAST  Result Date: 04/30/2020 CLINICAL DATA:  Non-small-cell lung cancer. Evaluate treatment response. Right upper lobe primary diagnosed in April. Spinal metastasis. Chemotherapy and immunotherapy. Short of breath. Liver metastasis. Status post T1-6 laminectomy and posterior fusion. EXAM: CT CHEST, ABDOMEN, AND PELVIS WITH CONTRAST TECHNIQUE: Multidetector CT imaging of the chest, abdomen and pelvis was performed following the standard protocol during bolus administration of intravenous contrast. CONTRAST:  170m OMNIPAQUE IOHEXOL 300 MG/ML  SOLN COMPARISON:  02/20/2020 chest CT.  PET 12/06/2019. FINDINGS: CT CHEST FINDINGS Cardiovascular: Aortic atherosclerosis. Normal heart size,  without pericardial effusion. Lad coronary artery calcification. Right Port-A-Cath tip at high right atrium. No central pulmonary embolism, on this non-dedicated study. Mediastinum/Nodes: No mediastinal or hilar adenopathy. The right apical paravertebral lesion is suboptimally evaluated secondary to the extent of beam hardening artifact from adjacent spinal hardware. On the order of 2.9 x 2.2 cm on 15/2. Possibly mildly decreased compared to 3.4 x 2.5 cm on the prior CT. Redemonstration of osseous destruction of posteromedial third and likely fourth right ribs. Lungs/Pleura: No pleural fluid.  Advanced bullous emphysema. Anterior right upper lobe pulmonary nodule measures on the order of 2.3 x 2.0 cm on 41/3 versus 2.2 x 1.9 cm on 02/20/2020. Right apical similar. Clear left lung. Musculoskeletal: Posteromedial right rib involvement as detailed above. T1-6 fixation with similar T3 and T4 osseous involvement. Vertebral body height loss at T3 and less so T4 is also not significantly changed. CT ABDOMEN PELVIS FINDINGS Hepatobiliary: Hepatic metastasis. Given comparison to the previous chest CT, and differences in bolus timing, improved. Example high right hepatic lobe 1.1 cm lesion on 63/2 measured 2.3 cm on the prior chest CT (when remeasured). Segment 2 lesion measures 1.5 cm on 59/2 versus 2.5 cm on the prior diagnostic CT. Normal gallbladder, without biliary ductal dilatation. Pancreas: Normal, without mass or ductal dilatation. Spleen: Normal in size, without focal abnormality. Adrenals/Urinary Tract: Normal adrenal glands. Normal kidneys, without hydronephrosis. Normal  urinary bladder. Stomach/Bowel: Normal stomach, without wall thickening. Normal colon and terminal ileum. Normal small bowel. Vascular/Lymphatic: Aortic atherosclerosis. Circumaortic left renal vein. No abdominopelvic adenopathy. Reproductive: Mild prostatomegaly. Other: No significant free fluid. Surgical changes in the right groin possibly of  inguinal hernia repair. No evidence of omental or peritoneal disease. Subcutaneous soft tissue density structure in the right inguinal region measures 2.0 cm on 118/2 and is new or more well-defined than on the prior PET. Musculoskeletal: A 9 mm right iliac lucent lesion is similar, likely benign. IMPRESSION: 1. Since the chest CT of 02/20/2020, significant improvement in hepatic metastasis. 2. The right upper lobe pulmonary nodule is similar. Right paravertebral soft tissue metastasis may be slightly decreased. 3. Similar right rib rib and thoracic vertebral body involvement with no new sites of disease. 4. Aortic atherosclerosis (ICD10-I70.0) and emphysema (ICD10-J43.9). 5. Right inguinal subcutaneous lesion which is new or more distinct since the prior PET. Recommend attention on follow-up. Electronically Signed   By: Abigail Miyamoto M.D.   On: 04/30/2020 11:02    ASSESSMENT & PLAN:   Cancer of upper lobe of right lung ([place]) # Stage IV metastatic non-small cell lung cancer favor adeno; mets to bone; liver.  Currently on Keytruda maintenance.  Oct 10th, 2021- .partial response Priscille Loveless lung lesion; improved para spinal mass; improvement of the liver lesions]. STABLE.   # proceed with maintenance [place]; Labs today reviewed;  acceptable for treatment today.  Discussed that is recommended maintenance Keytruda until progression or intolerance.  #Bone metastases- on zometa 3 mg IVPB.  Every 6 weeks. Ca-9.0; on  ca+ vit D BID;  STABLE  #Malnutrition/cachexia-continue weight loss/underlying malignancy.  S/p nutrition evaluation- STABLE.   # COPD- STABLE;-Trelegy.  # Insomnia-continue Seroquel.stable  #Debility-spinal cord compression/status post surgery-on physical therapy--STABLE  # DISPOSITION: # Keytruda today; # follow up in 3 weeks-  MD;labs- cbc/cmp;TSH Keytruda; Zometa-Dr.B  # I reviewed the blood work- with the patient in detail; also reviewed the imaging independently [as summarized  above]; and with the patient in detail.     All questions were answered. The patient knows to call the clinic with any problems, questions or concerns.    Cammie Sickle, MD 05/16/2020 10:14 AM

## 2020-05-16 NOTE — Assessment & Plan Note (Addendum)
#   Stage IV metastatic non-small cell lung cancer favor adeno; mets to bone; liver.  Currently on Keytruda maintenance.  Oct 10th, 2021- .partial response Priscille Loveless lung lesion; improved para spinal mass; improvement of the liver lesions]. STABLE.   # proceed with maintenance [place]; Labs today reviewed;  acceptable for treatment today.  Discussed that is recommended maintenance Keytruda until progression or intolerance.  #Bone metastases- on zometa 3 mg IVPB.  Every 6 weeks. Ca-9.0; on  ca+ vit D BID;  STABLE  #Malnutrition/cachexia-continue weight loss/underlying malignancy.  S/p nutrition evaluation- STABLE.   # COPD- STABLE;-Trelegy.  # Insomnia-continue Seroquel.stable  #Debility-spinal cord compression/status post surgery-on physical therapy--STABLE  # DISPOSITION: # Keytruda today; # follow up in 3 weeks-  MD;labs- cbc/cmp;TSH Keytruda; Zometa-Dr.B  # I reviewed the blood work- with the patient in detail; also reviewed the imaging independently [as summarized above]; and with the patient in detail.

## 2020-06-01 DIAGNOSIS — J441 Chronic obstructive pulmonary disease with (acute) exacerbation: Secondary | ICD-10-CM | POA: Diagnosis not present

## 2020-06-01 DIAGNOSIS — J9601 Acute respiratory failure with hypoxia: Secondary | ICD-10-CM | POA: Diagnosis not present

## 2020-06-05 ENCOUNTER — Other Ambulatory Visit: Payer: Self-pay

## 2020-06-05 DIAGNOSIS — C3411 Malignant neoplasm of upper lobe, right bronchus or lung: Secondary | ICD-10-CM

## 2020-06-06 ENCOUNTER — Other Ambulatory Visit: Payer: Self-pay

## 2020-06-06 ENCOUNTER — Encounter: Payer: Self-pay | Admitting: Internal Medicine

## 2020-06-06 ENCOUNTER — Inpatient Hospital Stay: Payer: Medicare HMO | Attending: Internal Medicine

## 2020-06-06 ENCOUNTER — Inpatient Hospital Stay (HOSPITAL_BASED_OUTPATIENT_CLINIC_OR_DEPARTMENT_OTHER): Payer: Medicare HMO | Admitting: Internal Medicine

## 2020-06-06 ENCOUNTER — Inpatient Hospital Stay: Payer: Medicare HMO

## 2020-06-06 VITALS — BP 115/73 | HR 72 | Temp 97.2°F | Resp 16 | Ht 69.0 in | Wt 123.0 lb

## 2020-06-06 DIAGNOSIS — C3411 Malignant neoplasm of upper lobe, right bronchus or lung: Secondary | ICD-10-CM | POA: Diagnosis not present

## 2020-06-06 DIAGNOSIS — C7951 Secondary malignant neoplasm of bone: Secondary | ICD-10-CM | POA: Insufficient documentation

## 2020-06-06 DIAGNOSIS — J449 Chronic obstructive pulmonary disease, unspecified: Secondary | ICD-10-CM | POA: Insufficient documentation

## 2020-06-06 DIAGNOSIS — R5383 Other fatigue: Secondary | ICD-10-CM | POA: Diagnosis not present

## 2020-06-06 DIAGNOSIS — G4700 Insomnia, unspecified: Secondary | ICD-10-CM | POA: Diagnosis not present

## 2020-06-06 DIAGNOSIS — Z5112 Encounter for antineoplastic immunotherapy: Secondary | ICD-10-CM | POA: Diagnosis not present

## 2020-06-06 DIAGNOSIS — Z7289 Other problems related to lifestyle: Secondary | ICD-10-CM | POA: Insufficient documentation

## 2020-06-06 DIAGNOSIS — Z87891 Personal history of nicotine dependence: Secondary | ICD-10-CM | POA: Diagnosis not present

## 2020-06-06 DIAGNOSIS — M25511 Pain in right shoulder: Secondary | ICD-10-CM | POA: Insufficient documentation

## 2020-06-06 DIAGNOSIS — C787 Secondary malignant neoplasm of liver and intrahepatic bile duct: Secondary | ICD-10-CM | POA: Diagnosis not present

## 2020-06-06 DIAGNOSIS — Z801 Family history of malignant neoplasm of trachea, bronchus and lung: Secondary | ICD-10-CM | POA: Diagnosis not present

## 2020-06-06 DIAGNOSIS — Z7189 Other specified counseling: Secondary | ICD-10-CM

## 2020-06-06 DIAGNOSIS — M549 Dorsalgia, unspecified: Secondary | ICD-10-CM | POA: Insufficient documentation

## 2020-06-06 DIAGNOSIS — R197 Diarrhea, unspecified: Secondary | ICD-10-CM | POA: Insufficient documentation

## 2020-06-06 DIAGNOSIS — Z8249 Family history of ischemic heart disease and other diseases of the circulatory system: Secondary | ICD-10-CM | POA: Insufficient documentation

## 2020-06-06 DIAGNOSIS — C3410 Malignant neoplasm of upper lobe, unspecified bronchus or lung: Secondary | ICD-10-CM

## 2020-06-06 DIAGNOSIS — Z79899 Other long term (current) drug therapy: Secondary | ICD-10-CM | POA: Diagnosis not present

## 2020-06-06 DIAGNOSIS — R0602 Shortness of breath: Secondary | ICD-10-CM | POA: Insufficient documentation

## 2020-06-06 DIAGNOSIS — M2550 Pain in unspecified joint: Secondary | ICD-10-CM | POA: Insufficient documentation

## 2020-06-06 LAB — COMPREHENSIVE METABOLIC PANEL
ALT: 19 U/L (ref 0–44)
AST: 26 U/L (ref 15–41)
Albumin: 3.9 g/dL (ref 3.5–5.0)
Alkaline Phosphatase: 61 U/L (ref 38–126)
Anion gap: 9 (ref 5–15)
BUN: 17 mg/dL (ref 8–23)
CO2: 29 mmol/L (ref 22–32)
Calcium: 9.2 mg/dL (ref 8.9–10.3)
Chloride: 98 mmol/L (ref 98–111)
Creatinine, Ser: 0.66 mg/dL (ref 0.61–1.24)
GFR, Estimated: >60 mL/min (ref >60)
Glucose, Bld: 93 mg/dL (ref 70–99)
Potassium: 4.1 mmol/L (ref 3.5–5.1)
Sodium: 136 mmol/L (ref 135–145)
Total Bilirubin: 0.9 mg/dL (ref 0.3–1.2)
Total Protein: 6.9 g/dL (ref 6.5–8.1)

## 2020-06-06 LAB — CBC WITH DIFFERENTIAL/PLATELET
Abs Immature Granulocytes: 0.01 10*3/uL (ref 0.00–0.07)
Basophils Absolute: 0.1 10*3/uL (ref 0.0–0.1)
Basophils Relative: 1 %
Eosinophils Absolute: 0.4 10*3/uL (ref 0.0–0.5)
Eosinophils Relative: 8 %
HCT: 41 % (ref 39.0–52.0)
Hemoglobin: 14.1 g/dL (ref 13.0–17.0)
Immature Granulocytes: 0 %
Lymphocytes Relative: 15 %
Lymphs Abs: 0.8 10*3/uL (ref 0.7–4.0)
MCH: 32.5 pg (ref 26.0–34.0)
MCHC: 34.4 g/dL (ref 30.0–36.0)
MCV: 94.5 fL (ref 80.0–100.0)
Monocytes Absolute: 0.6 10*3/uL (ref 0.1–1.0)
Monocytes Relative: 11 %
Neutro Abs: 3.4 10*3/uL (ref 1.7–7.7)
Neutrophils Relative %: 65 %
Platelets: 180 10*3/uL (ref 150–400)
RBC: 4.34 MIL/uL (ref 4.22–5.81)
RDW: 12.7 % (ref 11.5–15.5)
WBC: 5.2 10*3/uL (ref 4.0–10.5)
nRBC: 0 % (ref 0.0–0.2)

## 2020-06-06 LAB — TSH: TSH: 3.759 u[IU]/mL (ref 0.350–4.500)

## 2020-06-06 MED ORDER — HEPARIN SOD (PORK) LOCK FLUSH 100 UNIT/ML IV SOLN
INTRAVENOUS | Status: AC
  Filled 2020-06-06: qty 5

## 2020-06-06 MED ORDER — HEPARIN SOD (PORK) LOCK FLUSH 100 UNIT/ML IV SOLN
500.0000 [IU] | Freq: Once | INTRAVENOUS | Status: AC
  Administered 2020-06-06: 500 [IU] via INTRAVENOUS
  Filled 2020-06-06: qty 5

## 2020-06-06 MED ORDER — SODIUM CHLORIDE 0.9% FLUSH
10.0000 mL | INTRAVENOUS | Status: DC | PRN
  Administered 2020-06-06: 10 mL via INTRAVENOUS
  Filled 2020-06-06: qty 10

## 2020-06-06 MED ORDER — SODIUM CHLORIDE 0.9 % IV SOLN
Freq: Once | INTRAVENOUS | Status: AC
  Filled 2020-06-06: qty 250

## 2020-06-06 MED ORDER — ZOLEDRONIC ACID 4 MG/5ML IV CONC
3.0000 mg | Freq: Once | INTRAVENOUS | Status: AC
  Administered 2020-06-06: 3 mg via INTRAVENOUS
  Filled 2020-06-06: qty 3.75

## 2020-06-06 NOTE — Progress Notes (Signed)
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   # MAY 26th-Keytrda x2 cycles; [borderline PS] July 7th-carbo Alimta Keytruda cycle #1  # NGS/MOLECULAR TESTS:   # PALLIATIVE CARE EVALUATION:  # PAIN MANAGEMENT:    DIAGNOSIS:   STAGE:         ;  GOALS:  CURRENT/MOST RECENT THERAPY :     Cancer of upper lobe of right lung ([place])  11/23/2019 Initial Diagnosis   Cancer of upper lobe of right lung ([place])   12/21/2019 -  Chemotherapy   The patient had dexamethasone (DECADRON) 4 MG tablet, 1 of 1 cycle, Start date: --, End date: -- palonosetron (ALOXI) injection 0.25 mg, 0.25 mg, Intravenous,  Once, 4 of 4 cycles Administration: 0.25 mg (02/01/2020), 0.25 mg (02/22/2020), 0.25 mg (03/14/2020), 0.25 mg (04/04/2020) PEMEtrexed (ALIMTA) 800 mg in sodium chloride 0.9 % 100 mL chemo infusion, 500 mg/m2 = 800 mg, Intravenous,  Once, 4 of 4 cycles Administration: 800 mg (02/01/2020), 800 mg (02/22/2020), 800 mg (03/14/2020), 800 mg (04/04/2020) CARBOplatin (PARAPLATIN) 330 mg in sodium chloride 0.9 % 250 mL chemo infusion, 330 mg (100 % of original dose 328.8 mg), Intravenous,  Once, 4 of 4 cycles Dose modification:   (original dose 328.8 mg, Cycle 3),   (original dose 328.8 mg, Cycle 5) Administration: 330 mg (02/01/2020), 330 mg (02/22/2020), 330 mg (03/14/2020), 330 mg  (04/04/2020) fosaprepitant (EMEND) 150 mg in sodium chloride 0.9 % 145 mL IVPB, 150 mg, Intravenous,  Once, 4 of 4 cycles Administration: 150 mg (02/01/2020), 150 mg (02/22/2020), 150 mg (03/14/2020), 150 mg (04/04/2020) pembrolizumab (KEYTRUDA) 200 mg in sodium chloride 0.9 % 50 mL chemo infusion, 200 mg, Intravenous, Once, 8 of 10 cycles Administration: 200 mg (12/21/2019), 200 mg (02/01/2020), 200 mg (02/22/2020), 200 mg (03/14/2020), 200 mg (04/04/2020), 200 mg (01/11/2020), 200 mg (04/25/2020), 200 mg (05/16/2020)  for chemotherapy treatment.     HISTORY OF PRESENTING ILLNESS:  Cody [name] 64 y.o.  male patient with metastatic non-small cell lung cancer; cord compression status post decompressive surgery; currently on Keytruda-is here for follow-up.  Patient complains of diarrhea up to 3 loose stools a day.  Started about 2 to 3 days ago.  No blood in stools or mucus.  Denies any abdominal cramping.  No recent antibiotics.  No change in diet.  No nausea no vomiting.  Continues to ambulate with a walker at home.  Continues to use nasal cannula 2 L.  Noted to have worsening pain in his right shoulder progressive getting worse. . Review of Systems  Constitutional: Positive for malaise/fatigue. Negative for chills, diaphoresis and fever.  HENT: Negative for nosebleeds and sore throat.   Eyes: Negative for double vision.  Respiratory: Positive for shortness of breath. Negative for cough, hemoptysis, sputum production and wheezing.   Cardiovascular: Negative for chest pain, palpitations, orthopnea and leg swelling.  Gastrointestinal: Positive for diarrhea. Negative for abdominal pain, blood in stool, constipation, heartburn, melena,  nausea and vomiting.  Genitourinary: Negative for dysuria, frequency and urgency.  Musculoskeletal: Positive for back pain and joint pain.  Skin: Negative.  Negative for itching and rash.  Neurological: Negative for dizziness, tingling, focal weakness, weakness and headaches.   Endo/Heme/Allergies: Does not bruise/bleed easily.  Psychiatric/Behavioral: Negative for depression. The patient is not nervous/anxious and does not have insomnia.      MEDICAL HISTORY:  Past Medical History:  Diagnosis Date  . Allergy   . Cancer of upper lobe of right lung ([place]) 10/2019  . COPD (chronic obstructive pulmonary disease) ([place])   . Prostate enlargement     SURGICAL HISTORY: Past Surgical History:  Procedure Laterality Date  . HERNIA REPAIR Bilateral 1982  . HERNIA REPAIR Right 1994  . IR IMAGING GUIDED PORT INSERTION  02/24/2020  . LAMINECTOMY FOR EXCISION / EVACUATION INTRASPINAL LESION  11/07/2019   T1-T 6 at [facility]     SOCIAL HISTORY: Social History   Socioeconomic History  . Marital status: Married    Spouse name: [name]   . Number of children: 4  . Years of education: Not on file  . Highest education level: Some college, no degree  Occupational History  . Occupation: dispatch and delivery   Tobacco Use  . Smoking status: Former Smoker    Packs/day: 2.00    Years: 30.00    Pack years: 60.00    Quit date: 07/29/2011    Years since quitting: 8.8  . Smokeless tobacco: Never Used  . Tobacco comment: pt vapes  Vaping Use  . Vaping Use: Never assessed  Substance and Sexual Activity  . Alcohol use: Yes    Alcohol/week: 5.0 standard drinks    Types: 5 Standard drinks or equivalent per week    Comment: 1-2/day  . Drug use: No  . Sexual activity: Not Currently    Partners: Female  Other Topics Concern  . Not on file  Social History Narrative   Worked in warehouse/ quit smoking in 2015; beer 1/day; in [place]; with wife.    Social Determinants of Health   Financial Resource Strain: Low Risk   . Difficulty of Paying Living Expenses: Not hard at all  Food Insecurity: No Food Insecurity  . Worried About Charity fundraiser in the Last Year: Never true  . Ran Out of Food in the Last Year: Never true  Transportation Needs: No Transportation Needs  .  Lack of Transportation (Medical): No  . Lack of Transportation (Non-Medical): No  Physical Activity: Insufficiently Active  . Days of Exercise per Week: 7 days  . Minutes of Exercise per Session: 20 min  Stress: No Stress Concern Present  . Feeling of Stress : Not at all  Social Connections: Socially Integrated  . Frequency of Communication with Friends and Family: More than three times a week  . Frequency of Social Gatherings with Friends and Family: More than three times a week  . Attends Religious Services: More than 4 times per year  . Active Member of Clubs or Organizations: Yes  . Attends Archivist Meetings: More than 4 times per year  . Marital Status: Married  Human resources officer Violence: Not At Risk  . Fear of Current or Ex-Partner: No  . Emotionally Abused: No  . Physically Abused: No  . Sexually Abused: No    FAMILY HISTORY: Family History  Problem Relation Age of Onset  . Heart disease Father   . CVA Mother   . Lung cancer Sister   .  Lung cancer Brother     ALLERGIES:  has No Known Allergies.  MEDICATIONS:  Current Outpatient Medications  Medication Sig Dispense Refill  . acetaminophen (TYLENOL) 500 MG tablet Take 500-1,000 mg by mouth every 6 (six) hours as needed for mild pain or fever.     Marland Kitchen albuterol (VENTOLIN HFA) 108 (90 Base) MCG/ACT inhaler Inhale 2 puffs into the lungs every 4 (four) hours as needed for wheezing or shortness of breath. 18 g 0  . Calcium Carb-Cholecalciferol (OYSTER SHELL CALCIUM) 500-400 MG-UNIT TABS Take 500 tablets by mouth.    . escitalopram (LEXAPRO) 5 MG tablet Take 1 tablet (5 mg total) by mouth daily. am's 90 tablet 1  . Fluticasone-Umeclidin-Vilant (TRELEGY ELLIPTA) 100-62.5-25 MCG/INH AEPB Inhale 1 puff into the lungs daily. 180 each 1  . lidocaine-prilocaine (EMLA) cream Apply 1 application topically as needed. Apply small amount to port site at least 1 hour prior to it being accessed, cover with plastic wrap 30 g 1  .  Multiple Vitamins-Minerals (MULTIVITAMIN GUMMIES MENS PO) Take 1 Dose by mouth daily.     . QUEtiapine (SEROQUEL) 25 MG tablet Take 1 tablet (25 mg total) by mouth at bedtime. 90 tablet 1  . ondansetron (ZOFRAN) 4 MG tablet Take 1 tablet (4 mg total) by mouth every 8 (eight) hours as needed for nausea or vomiting. (Patient not taking: Reported on 04/25/2020) 20 tablet 2  . prochlorperazine (COMPAZINE) 10 MG tablet Take 1 tablet (10 mg total) by mouth every 6 (six) hours as needed for nausea or vomiting. (Patient not taking: Reported on 04/25/2020) 40 tablet 1   No current facility-administered medications for this visit.   Facility-Administered Medications Ordered in Other Visits  Medication Dose Route Frequency Provider Last Rate Last Admin  . 0.9 %  sodium chloride infusion   Intravenous Once Charlaine Dalton R, MD      . heparin lock flush 100 unit/mL  500 Units Intravenous Once Charlaine Dalton R, MD      . sodium chloride flush (NS) 0.9 % injection 10 mL  10 mL Intravenous PRN Charlaine Dalton R, MD   10 mL at 06/06/20 0833  . zolendronic acid (ZOMETA) 3 mg in sodium chloride 0.9 % 100 mL IVPB  3 mg Intravenous Once Charlaine Dalton R, MD          .  PHYSICAL EXAMINATION: ECOG PERFORMANCE STATUS: 3 - Symptomatic, >50% confined to bed  Vitals:   06/06/20 0844 06/06/20 0847  BP: 115/73   Pulse: 62 72  Resp: 16 16  Temp: (!) 97.2 F (36.2 C) (!) 97.2 F (36.2 C)  SpO2: 100% 100%   Filed Weights   06/06/20 0844  Weight: 123 lb (55.8 kg)    Physical Exam Constitutional:      Comments: Thin built frail appearing male patient.  No acute distress.  HEENT negative.  Accompanied by his daughter  HENT:     Head: Normocephalic and atraumatic.     Mouth/Throat:     Pharynx: No oropharyngeal exudate.  Eyes:     Pupils: Pupils are equal, round, and reactive to light.  Cardiovascular:     Rate and Rhythm: Normal rate and regular rhythm.  Pulmonary:     Effort: No  respiratory distress.     Breath sounds: No wheezing.     Comments: Decreased air entry bilaterally. Abdominal:     General: Bowel sounds are normal. There is no distension.     Palpations: Abdomen is soft. There is  no mass.     Tenderness: There is no abdominal tenderness. There is no guarding or rebound.  Musculoskeletal:        General: No tenderness. Normal range of motion.     Cervical back: Normal range of motion and neck supple.  Skin:    General: Skin is warm.  Neurological:     Mental Status: He is alert and oriented to person, place, and time.  Psychiatric:        Mood and Affect: Affect normal.      LABORATORY DATA:  I have reviewed the data as listed Lab Results  Component Value Date   WBC 5.2 06/06/2020   HGB 14.1 06/06/2020   HCT 41.0 06/06/2020   MCV 94.5 06/06/2020   PLT 180 06/06/2020   Recent Labs    03/14/20 0831 03/14/20 0831 04/04/20 0827 04/04/20 0827 04/25/20 0834 05/16/20 0824 06/06/20 0833  NA 139   < > 137   < > 139 138 136  K 3.9   < > 4.3   < > 4.2 3.9 4.1  CL 103   < > 99   < > 102 98 98  CO2 29   < > 28   < > 28 30 29   GLUCOSE 104*   < > 181*   < > 102* 176* 93  BUN 19   < > 19   < > 15 18 17   CREATININE 0.56*   < > 0.61   < > 0.60* 0.56* 0.66  CALCIUM 8.8*   < > 8.8*   < > 8.8* 9.0 9.2  GFRNONAA >60   < > >60   < > >60 >60 >60  GFRAA >60  --  >60  --  >60  --   --   PROT 6.9   < > 6.7   < > 6.4* 6.9 6.9  ALBUMIN 3.8   < > 3.7   < > 3.7 3.8 3.9  AST 24   < > 29   < > 23 28 26   ALT 20   < > 20   < > 19 19 19   ALKPHOS 82   < > 77   < > 66 71 61  BILITOT 0.5   < > 0.6   < > 0.6 1.0 0.9   < > = values in this interval not displayed.    RADIOGRAPHIC STUDIES: I have personally reviewed the radiological images as listed and agreed with the findings in the report. No results found.  ASSESSMENT & PLAN:   Cancer of upper lobe of right lung ([place]) # Stage IV metastatic non-small cell lung cancer favor adeno; mets to bone; liver.   Currently on Keytruda maintenance.  Oct 10th, 2021- .partial response Priscille Loveless lung lesion; improved para spinal mass; improvement of the liver lesions]. STABLE.   # HOLD maintenance [name]; Labs today reviewed;  acceptable for treatment today- see diarrhea  # Diarrhea-G-1 [3 loose stools]; no cramping/blood- none today; the etiology is unclear.  Immunotherapy versus others.  Hold off steroids at this time.  Let us know if diarrhea does not improve over the weekend.  Hold Keytruda today.  # Bone metastases- on zometa 3 mg IVPB.  Every 6 weeks. Ca-9.2; on  ca+ vit D BID;  STABLE; Zometa today.  #Malnutrition/cachexia-continue weight loss/underlying malignancy.  S/p nutrition evaluation-STABLE.   # COPD- STABLE;-Trelegy.  # Insomnia-continue Seroquel.STABLE  #Debility-spinal cord compression/status post surgery-on physical therapy--STABLE  # Right shoulder pain/limiting movement- check  X-rays right shoulder.   # DISPOSITION: # HOLD Keytruda today; proceed with Zometa today # follow up on NOV 22nd-  MD;labs- cbc/cmp; Keytruda;Dr.B     All questions were answered. The patient knows to call the clinic with any problems, questions or concerns.    Cammie Sickle, MD 06/06/2020 9:25 AM

## 2020-06-06 NOTE — Patient Instructions (Signed)
#   call us if diarrhea not better by te end of the week  # get X-rays of the shoudler.

## 2020-06-06 NOTE — Assessment & Plan Note (Addendum)
#   Stage IV metastatic non-small cell lung cancer favor adeno; mets to bone; liver.  Currently on Keytruda maintenance.  Oct 10th, 2021- .partial response Cody [name] lung lesion; improved para spinal mass; improvement of the liver lesions]. STABLE.   # HOLD maintenance [name]; Labs today reviewed;  acceptable for treatment today- see diarrhea  # Diarrhea-G-1 [3 loose stools]; no cramping/blood- none today; the etiology is unclear.  Immunotherapy versus others.  Hold off steroids at this time.  Let us know if diarrhea does not improve over the weekend.  Hold Keytruda today.  # Bone metastases- on zometa 3 mg IVPB.  Every 6 weeks. Ca-9.2; on  ca+ vit D BID;  STABLE; Zometa today.  #Malnutrition/cachexia-continue weight loss/underlying malignancy.  S/p nutrition evaluation-STABLE.   # COPD- STABLE;-Trelegy.  # Insomnia-continue Seroquel.STABLE  #Debility-spinal cord compression/status post surgery-on physical therapy--STABLE  # Right shoulder pain/limiting movement- check X-rays right shoulder.   # DISPOSITION: # HOLD Keytruda today; proceed with Zometa today # follow up on NOV 22nd-  MD;labs- cbc/cmp; Keytruda;Dr.B

## 2020-06-08 ENCOUNTER — Telehealth: Payer: Self-pay | Admitting: *Deleted

## 2020-06-08 NOTE — Telephone Encounter (Signed)
Patient called and wanted to speak to [facility]. Patient reports that his diarrhea has resolved and he is feeling much better. He will keep his next apts with Dr. B as scheduled on 11/22

## 2020-06-15 ENCOUNTER — Other Ambulatory Visit: Payer: Self-pay

## 2020-06-18 ENCOUNTER — Other Ambulatory Visit: Payer: Self-pay | Admitting: Family Medicine

## 2020-06-18 ENCOUNTER — Inpatient Hospital Stay: Payer: Medicare HMO

## 2020-06-18 ENCOUNTER — Inpatient Hospital Stay (HOSPITAL_BASED_OUTPATIENT_CLINIC_OR_DEPARTMENT_OTHER): Payer: Medicare HMO | Admitting: Internal Medicine

## 2020-06-18 ENCOUNTER — Encounter: Payer: Self-pay | Admitting: Internal Medicine

## 2020-06-18 DIAGNOSIS — G4700 Insomnia, unspecified: Secondary | ICD-10-CM

## 2020-06-18 DIAGNOSIS — Z5112 Encounter for antineoplastic immunotherapy: Secondary | ICD-10-CM | POA: Diagnosis not present

## 2020-06-18 DIAGNOSIS — C3411 Malignant neoplasm of upper lobe, right bronchus or lung: Secondary | ICD-10-CM | POA: Diagnosis not present

## 2020-06-18 DIAGNOSIS — Z7189 Other specified counseling: Secondary | ICD-10-CM

## 2020-06-18 DIAGNOSIS — C3410 Malignant neoplasm of upper lobe, unspecified bronchus or lung: Secondary | ICD-10-CM

## 2020-06-18 LAB — COMPREHENSIVE METABOLIC PANEL
ALT: 18 U/L (ref 0–44)
AST: 24 U/L (ref 15–41)
Albumin: 3.7 g/dL (ref 3.5–5.0)
Alkaline Phosphatase: 58 U/L (ref 38–126)
Anion gap: 7 (ref 5–15)
BUN: 14 mg/dL (ref 8–23)
CO2: 31 mmol/L (ref 22–32)
Calcium: 8.6 mg/dL — ABNORMAL LOW (ref 8.9–10.3)
Chloride: 101 mmol/L (ref 98–111)
Creatinine, Ser: 0.57 mg/dL — ABNORMAL LOW (ref 0.61–1.24)
GFR, Estimated: >60 mL/min (ref >60)
Glucose, Bld: 96 mg/dL (ref 70–99)
Potassium: 4 mmol/L (ref 3.5–5.1)
Sodium: 139 mmol/L (ref 135–145)
Total Bilirubin: 0.7 mg/dL (ref 0.3–1.2)
Total Protein: 6.8 g/dL (ref 6.5–8.1)

## 2020-06-18 LAB — CBC WITH DIFFERENTIAL/PLATELET
Abs Immature Granulocytes: 0.01 10*3/uL (ref 0.00–0.07)
Basophils Absolute: 0.1 10*3/uL (ref 0.0–0.1)
Basophils Relative: 1 %
Eosinophils Absolute: 0.5 10*3/uL (ref 0.0–0.5)
Eosinophils Relative: 10 %
HCT: 41.2 % (ref 39.0–52.0)
Hemoglobin: 13.8 g/dL (ref 13.0–17.0)
Immature Granulocytes: 0 %
Lymphocytes Relative: 18 %
Lymphs Abs: 0.9 10*3/uL (ref 0.7–4.0)
MCH: 31.7 pg (ref 26.0–34.0)
MCHC: 33.5 g/dL (ref 30.0–36.0)
MCV: 94.5 fL (ref 80.0–100.0)
Monocytes Absolute: 0.7 10*3/uL (ref 0.1–1.0)
Monocytes Relative: 14 %
Neutro Abs: 2.7 10*3/uL (ref 1.7–7.7)
Neutrophils Relative %: 57 %
Platelets: 185 10*3/uL (ref 150–400)
RBC: 4.36 MIL/uL (ref 4.22–5.81)
RDW: 12.4 % (ref 11.5–15.5)
WBC: 4.8 10*3/uL (ref 4.0–10.5)
nRBC: 0 % (ref 0.0–0.2)

## 2020-06-18 MED ORDER — QUETIAPINE FUMARATE 25 MG PO TABS: 25.0000 mg | ORAL_TABLET | Freq: Every day | ORAL | 0 refills | Status: DC

## 2020-06-18 MED ORDER — SODIUM CHLORIDE 0.9 % IV SOLN
Freq: Once | INTRAVENOUS | Status: AC
  Filled 2020-06-18: qty 250

## 2020-06-18 MED ORDER — SODIUM CHLORIDE 0.9% FLUSH
10.0000 mL | Freq: Once | INTRAVENOUS | Status: AC
  Administered 2020-06-18: 10 mL via INTRAVENOUS
  Filled 2020-06-18: qty 10

## 2020-06-18 MED ORDER — SODIUM CHLORIDE 0.9 % IV SOLN
200.0000 mg | Freq: Once | INTRAVENOUS | Status: AC
  Administered 2020-06-18: 200 mg via INTRAVENOUS
  Filled 2020-06-18: qty 8

## 2020-06-18 MED ORDER — HEPARIN SOD (PORK) LOCK FLUSH 100 UNIT/ML IV SOLN
INTRAVENOUS | Status: AC
  Filled 2020-06-18: qty 5

## 2020-06-18 MED ORDER — HEPARIN SOD (PORK) LOCK FLUSH 100 UNIT/ML IV SOLN
500.0000 [IU] | Freq: Once | INTRAVENOUS | Status: AC
  Administered 2020-06-18: 500 [IU] via INTRAVENOUS
  Filled 2020-06-18: qty 5

## 2020-06-18 NOTE — Telephone Encounter (Signed)
Requested medication (s) are due for refill today: no  Requested medication (s) are on the active medication list: yes  Last refill:  04/10/20 #90 with 1 refill  Future visit scheduled: yes, 07/10/20  Notes to clinic:  Please review for refill. Refill not delegated per protocol. Patient states he is completely out of medication.    Requested Prescriptions  Pending Prescriptions Disp Refills   QUEtiapine (SEROQUEL) 25 MG tablet 90 tablet 1    Sig: Take 1 tablet (25 mg total) by mouth at bedtime.      Not Delegated - Psychiatry:  Antipsychotics - Second Generation (Atypical) - quetiapine Failed - 06/18/2020  2:51 PM      Failed - This refill cannot be delegated      Passed - ALT in normal range and within 180 days    ALT  Date Value Ref Range Status  06/18/2020 18 0 - 44 U/L Final          Passed - AST in normal range and within 180 days    AST  Date Value Ref Range Status  06/18/2020 24 15 - 41 U/L Final          Passed - Last BP in normal range    BP Readings from Last 1 Encounters:  06/18/20 113/68          Passed - Valid encounter within last 6 months    Recent Outpatient Visits           2 months ago Metastatic cancer to spine [facility])   [facility] Steele Sizer, MD   5 months ago Metastatic cancer to spine [facility])   [facility] Steele Sizer, MD   6 months ago Hospital discharge follow-up   [facility] Steele Sizer, MD   6 months ago Lung cancer metastatic to bone [facility])   [facility] Steele Sizer, MD   8 months ago Pulmonary emphysema, unspecified emphysema type [facility])   [facility] Steele Sizer, MD       Future Appointments             In 2 weeks Borders, Kirt Boys, NP [facility]   In 3 weeks Steele Sizer, MD [facility]

## 2020-06-18 NOTE — Assessment & Plan Note (Signed)
#   Stage IV metastatic non-small cell lung cancer favor adeno; mets to bone; liver.  Currently on Keytruda maintenance.  Oct 10th, 2021- .partial response Priscille Loveless lung lesion; improved para spinal mass; improvement of the liver lesions]. STABLE.  # Proceed with [place]. Labs today reviewed;  acceptable for treatment today. wil plan repeat scan in Jan 2022.   # Diarrhea-G-1-no cramping/blood- none today- resolved.   # Bone metastases- on zometa 3 mg IVPB.  Every 6 weeks. Ca-9.2; on  ca+ vit D BID;  STABLE.   #Malnutrition/cachexia-continue weight loss/underlying malignancy.  S/p nutrition evaluation-STABLE.   # COPD- STABLE;-Trelegy.  # Insomnia-continue Seroquel.STABLE  #Debility-spinal cord compression/status post surgery-on physical therapy--STABLE  # Right shoulder pain/limiting movement- check X-rays right shoulder.   # DISPOSITION: # Keytruda today # follow up in 3 weeks  MD;labs- cbc/cmp; Keytruda;ZometaDr.B

## 2020-06-18 NOTE — Progress Notes (Signed)
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   # MAY 26th-Keytrda x2 cycles; [borderline PS] July 7th-carbo Alimta Keytruda cycle #1  # NGS/MOLECULAR TESTS:   # PALLIATIVE CARE EVALUATION:  # PAIN MANAGEMENT:    DIAGNOSIS:   STAGE:         ;  GOALS:  CURRENT/MOST RECENT THERAPY :     Cancer of upper lobe of right lung ([place])  11/23/2019 Initial Diagnosis   Cancer of upper lobe of right lung ([place])   12/21/2019 -  Chemotherapy   The patient had dexamethasone (DECADRON) 4 MG tablet, 1 of 1 cycle, Start date: --, End date: -- palonosetron (ALOXI) injection 0.25 mg, 0.25 mg, Intravenous,  Once, 4 of 4 cycles Administration: 0.25 mg (02/01/2020), 0.25 mg (02/22/2020), 0.25 mg (03/14/2020), 0.25 mg (04/04/2020) PEMEtrexed (ALIMTA) 800 mg in sodium chloride 0.9 % 100 mL chemo infusion, 500 mg/m2 = 800 mg, Intravenous,  Once, 4 of 4 cycles Administration: 800 mg (02/01/2020), 800 mg (02/22/2020), 800 mg (03/14/2020), 800 mg (04/04/2020) CARBOplatin (PARAPLATIN) 330 mg in sodium chloride 0.9 % 250 mL chemo infusion, 330 mg (100 % of original dose 328.8 mg), Intravenous,  Once, 4 of 4 cycles Dose modification:   (original dose 328.8 mg, Cycle 3),   (original dose 328.8 mg, Cycle 5) Administration: 330 mg (02/01/2020), 330 mg (02/22/2020), 330 mg (03/14/2020), 330 mg  (04/04/2020) fosaprepitant (EMEND) 150 mg in sodium chloride 0.9 % 145 mL IVPB, 150 mg, Intravenous,  Once, 4 of 4 cycles Administration: 150 mg (02/01/2020), 150 mg (02/22/2020), 150 mg (03/14/2020), 150 mg (04/04/2020) pembrolizumab (KEYTRUDA) 200 mg in sodium chloride 0.9 % 50 mL chemo infusion, 200 mg, Intravenous, Once, 9 of 12 cycles Administration: 200 mg (12/21/2019), 200 mg (02/01/2020), 200 mg (02/22/2020), 200 mg (03/14/2020), 200 mg (04/04/2020), 200 mg (01/11/2020), 200 mg (04/25/2020), 200 mg (05/16/2020), 200 mg (06/18/2020)  for chemotherapy treatment.     HISTORY OF PRESENTING ILLNESS:  Cody [name] 64 y.o.  male patient with metastatic non-small cell lung cancer; cord compression status post decompressive surgery; currently on Keytruda-is here for follow-up.  Patient's [name] was held 2 weeks ago because of diarrhea.  Currently resolved.  Not had to be on steroids or any other medications.  He feels back to baseline.  Continues to have right shoulder pain.  He continues to be on 2 L of nasal cannula for his chronic shortness of breath.  No nausea no vomiting. . Review of Systems  Constitutional: Positive for malaise/fatigue. Negative for chills, diaphoresis and fever.  HENT: Negative for nosebleeds and sore throat.   Eyes: Negative for double vision.  Respiratory: Positive for shortness of breath. Negative for cough, hemoptysis, sputum production and wheezing.   Cardiovascular: Negative for chest pain, palpitations, orthopnea and leg swelling.  Gastrointestinal: Negative for abdominal pain, blood in stool, constipation, heartburn, melena, nausea and vomiting.  Genitourinary: Negative for dysuria, frequency and urgency.  Musculoskeletal: Positive for back  pain and joint pain.  Skin: Negative.  Negative for itching and rash.  Neurological: Negative for dizziness, tingling, focal weakness, weakness and headaches.  Endo/Heme/Allergies: Does not bruise/bleed easily.   Psychiatric/Behavioral: Negative for depression. The patient is not nervous/anxious and does not have insomnia.      MEDICAL HISTORY:  Past Medical History:  Diagnosis Date  . Allergy   . Cancer of upper lobe of right lung ([place]) 10/2019  . COPD (chronic obstructive pulmonary disease) ([place])   . Prostate enlargement     SURGICAL HISTORY: Past Surgical History:  Procedure Laterality Date  . HERNIA REPAIR Bilateral 1982  . HERNIA REPAIR Right 1994  . IR IMAGING GUIDED PORT INSERTION  02/24/2020  . LAMINECTOMY FOR EXCISION / EVACUATION INTRASPINAL LESION  11/07/2019   T1-T 6 at [facility]     SOCIAL HISTORY: Social History   Socioeconomic History  . Marital status: Married    Spouse name: [name]   . Number of children: 4  . Years of education: Not on file  . Highest education level: Some college, no degree  Occupational History  . Occupation: dispatch and delivery   Tobacco Use  . Smoking status: Former Smoker    Packs/day: 2.00    Years: 30.00    Pack years: 60.00    Quit date: 07/29/2011    Years since quitting: 8.9  . Smokeless tobacco: Never Used  . Tobacco comment: pt vapes  Vaping Use  . Vaping Use: Never assessed  Substance and Sexual Activity  . Alcohol use: Yes    Alcohol/week: 5.0 standard drinks    Types: 5 Standard drinks or equivalent per week    Comment: 1-2/day  . Drug use: No  . Sexual activity: Not Currently    Partners: Female  Other Topics Concern  . Not on file  Social History Narrative   Worked in warehouse/ quit smoking in 2015; beer 1/day; in [place]; with wife.    Social Determinants of Health   Financial Resource Strain: Low Risk   . Difficulty of Paying Living Expenses: Not hard at all  Food Insecurity: No Food Insecurity  . Worried About Charity fundraiser in the Last Year: Never true  . Ran Out of Food in the Last Year: Never true  Transportation Needs: No Transportation Needs  . Lack of Transportation (Medical): No  . Lack of  Transportation (Non-Medical): No  Physical Activity: Insufficiently Active  . Days of Exercise per Week: 7 days  . Minutes of Exercise per Session: 20 min  Stress: No Stress Concern Present  . Feeling of Stress : Not at all  Social Connections: Socially Integrated  . Frequency of Communication with Friends and Family: More than three times a week  . Frequency of Social Gatherings with Friends and Family: More than three times a week  . Attends Religious Services: More than 4 times per year  . Active Member of Clubs or Organizations: Yes  . Attends Archivist Meetings: More than 4 times per year  . Marital Status: Married  Human resources officer Violence: Not At Risk  . Fear of Current or Ex-Partner: No  . Emotionally Abused: No  . Physically Abused: No  . Sexually Abused: No    FAMILY HISTORY: Family History  Problem Relation Age of Onset  . Heart disease Father   . CVA Mother   . Lung cancer Sister   . Lung cancer Brother     ALLERGIES:  has No Known Allergies.  MEDICATIONS:  Current Outpatient Medications  Medication Sig Dispense Refill  . acetaminophen (TYLENOL) 500 MG tablet Take 500-1,000 mg by mouth every 6 (six) hours as needed for mild pain or fever.     Marland Kitchen albuterol (VENTOLIN HFA) 108 (90 Base) MCG/ACT inhaler Inhale 2 puffs into the lungs every 4 (four) hours as needed for wheezing or shortness of breath. 18 g 0  . Calcium Carb-Cholecalciferol (OYSTER SHELL CALCIUM) 500-400 MG-UNIT TABS Take 500 tablets by mouth.    . escitalopram (LEXAPRO) 5 MG tablet Take 1 tablet (5 mg total) by mouth daily. am's 90 tablet 1  . Fluticasone-Umeclidin-Vilant (TRELEGY ELLIPTA) 100-62.5-25 MCG/INH AEPB Inhale 1 puff into the lungs daily. 180 each 1  . lidocaine-prilocaine (EMLA) cream Apply 1 application topically as needed. Apply small amount to port site at least 1 hour prior to it being accessed, cover with plastic wrap 30 g 1  . Multiple Vitamins-Minerals (MULTIVITAMIN GUMMIES  MENS PO) Take 1 Dose by mouth daily.     . ondansetron (ZOFRAN) 4 MG tablet Take 1 tablet (4 mg total) by mouth every 8 (eight) hours as needed for nausea or vomiting. (Patient not taking: Reported on 04/25/2020) 20 tablet 2  . prochlorperazine (COMPAZINE) 10 MG tablet Take 1 tablet (10 mg total) by mouth every 6 (six) hours as needed for nausea or vomiting. (Patient not taking: Reported on 04/25/2020) 40 tablet 1  . QUEtiapine (SEROQUEL) 25 MG tablet Take 1 tablet (25 mg total) by mouth at bedtime. 90 tablet 0   No current facility-administered medications for this visit.      Marland Kitchen  PHYSICAL EXAMINATION: ECOG PERFORMANCE STATUS: 3 - Symptomatic, >50% confined to bed  Vitals:   06/18/20 0840  BP: 113/68  Pulse: 68  Resp: 16  Temp: (!) 96.1 F (35.6 C)  SpO2: 97%   Filed Weights   06/18/20 0840  Weight: 127 lb (57.6 kg)    Physical Exam Constitutional:      Comments: Thin built frail appearing male patient.  No acute distress.  HEENT negative.  Accompanied by his daughter  HENT:     Head: Normocephalic and atraumatic.     Mouth/Throat:     Pharynx: No oropharyngeal exudate.  Eyes:     Pupils: Pupils are equal, round, and reactive to light.  Cardiovascular:     Rate and Rhythm: Normal rate and regular rhythm.  Pulmonary:     Effort: No respiratory distress.     Breath sounds: No wheezing.     Comments: Decreased air entry bilaterally. Abdominal:     General: Bowel sounds are normal. There is no distension.     Palpations: Abdomen is soft. There is no mass.     Tenderness: There is no abdominal tenderness. There is no guarding or rebound.  Musculoskeletal:        General: No tenderness. Normal range of motion.     Cervical back: Normal range of motion and neck supple.  Skin:    General: Skin is warm.  Neurological:     Mental Status: He is alert and oriented to person, place, and time.  Psychiatric:        Mood and Affect: Affect normal.      LABORATORY DATA:  I  have reviewed the data as listed Lab Results  Component Value Date   WBC 4.8 06/18/2020   HGB 13.8 06/18/2020   HCT 41.2 06/18/2020   MCV 94.5 06/18/2020   PLT 185 06/18/2020   Recent Labs  03/14/20 0831 03/14/20 0831 04/04/20 0827 04/04/20 0827 04/25/20 8588 04/25/20 5027 05/16/20 0824 06/06/20 0833 06/18/20 0826  NA 139   < > 137   < > 139   < > 138 136 139  K 3.9   < > 4.3   < > 4.2   < > 3.9 4.1 4.0  CL 103   < > 99   < > 102   < > 98 98 101  CO2 29   < > 28   < > 28   < > 30 29 31   GLUCOSE 104*   < > 181*   < > 102*   < > 176* 93 96  BUN 19   < > 19   < > 15   < > 18 17 14   CREATININE 0.56*   < > 0.61   < > 0.60*   < > 0.56* 0.66 0.57*  CALCIUM 8.8*   < > 8.8*   < > 8.8*   < > 9.0 9.2 8.6*  GFRNONAA >60   < > >60   < > >60  --  >60 >60 >60  GFRAA >60  --  >60  --  >60  --   --   --   --   PROT 6.9   < > 6.7   < > 6.4*   < > 6.9 6.9 6.8  ALBUMIN 3.8   < > 3.7   < > 3.7   < > 3.8 3.9 3.7  AST 24   < > 29   < > 23   < > 28 26 24   ALT 20   < > 20   < > 19   < > 19 19 18   ALKPHOS 82   < > 77   < > 66   < > 71 61 58  BILITOT 0.5   < > 0.6   < > 0.6   < > 1.0 0.9 0.7   < > = values in this interval not displayed.    RADIOGRAPHIC STUDIES: I have personally reviewed the radiological images as listed and agreed with the findings in the report. No results found.  ASSESSMENT & PLAN:   Cancer of upper lobe of right lung ([place]) # Stage IV metastatic non-small cell lung cancer favor adeno; mets to bone; liver.  Currently on Keytruda maintenance.  Oct 10th, 2021- .partial response Priscille Loveless lung lesion; improved para spinal mass; improvement of the liver lesions]. STABLE.  # Proceed with [place]. Labs today reviewed;  acceptable for treatment today. wil plan repeat scan in Jan 2022.   # Diarrhea-G-1-no cramping/blood- none today- resolved.   # Bone metastases- on zometa 3 mg IVPB.  Every 6 weeks. Ca-9.2; on  ca+ vit D BID;  STABLE.   #Malnutrition/cachexia-continue weight  loss/underlying malignancy.  S/p nutrition evaluation-STABLE.   # COPD- STABLE;-Trelegy.  # Insomnia-continue Seroquel.STABLE  #Debility-spinal cord compression/status post surgery-on physical therapy--STABLE  # Right shoulder pain/limiting movement- check X-rays right shoulder.   # DISPOSITION: # Keytruda today # follow up in 3 weeks  MD;labs- cbc/cmp; Keytruda;ZometaDr.B     All questions were answered. The patient knows to call the clinic with any problems, questions or concerns.    Cammie Sickle, MD 06/25/2020 7:09 AM

## 2020-06-18 NOTE — Progress Notes (Signed)
1045- Patient tolerated treatment well. Patient discharged to home at this time.

## 2020-06-18 NOTE — Telephone Encounter (Signed)
Medication Refill - Medication: QUEtiapine (SEROQUEL) 25 MG tablet (Patient would like a supply of medication sent to pharmacy to hold him until upcoming appt. Patient is completely out and would like requested expedited. )   Has the patient contacted their pharmacy? yes (Agent: If no, request that the patient contact the pharmacy for the refill.) (Agent: If yes, when and what did the pharmacy advise?)Contact pcp  Preferred Pharmacy (with phone number or street name):  [place], [place] Phone:  [phone]  Fax:  [phone]       Agent: Please be advised that RX refills may take up to 3 business days. We ask that you follow-up with your pharmacy.

## 2020-06-25 ENCOUNTER — Other Ambulatory Visit: Payer: Self-pay

## 2020-06-25 ENCOUNTER — Inpatient Hospital Stay: Payer: Medicare HMO

## 2020-06-25 NOTE — Progress Notes (Signed)
Nutrition Follow-up:  Patient with lung cancer, liver mets, spinal cord compression/vertebral mets.  Patient receiving [place].    Spoke with patient via phone.  Patient reports appetite is good eating 2-3 good meals per day.  Reports diarrhea has resolved.  Denies any nutrition impact symptoms at this time.     Medications: reviewed  Labs: reviewed  Anthropometrics:   Weight 127 lb 11/22 stable from 127 lb on 9/29  124 lb on 7/16 118 lb on 6/16 115 lb on 6/3   NUTRITION DIAGNOSIS: Inadequate oral intake improved   INTERVENTION:  Complimentary case of ensure plus left at registration desk for patient to pick up.  Encouraged patient to continue eating high calorie, high protein foods to maintain weight. Patient has contact information and encouraged patient to call RD if something changes in nutritional status.  Patient is staying within UBW of 120-125 lb or higher    NEXT VISIT: no follow-up  Cody [name], [place], [place] Registered Dietitian [phone] (mobile)

## 2020-07-01 DIAGNOSIS — J441 Chronic obstructive pulmonary disease with (acute) exacerbation: Secondary | ICD-10-CM | POA: Diagnosis not present

## 2020-07-01 DIAGNOSIS — J9601 Acute respiratory failure with hypoxia: Secondary | ICD-10-CM | POA: Diagnosis not present

## 2020-07-04 ENCOUNTER — Inpatient Hospital Stay: Payer: Medicare HMO | Attending: Hospice and Palliative Medicine | Admitting: Hospice and Palliative Medicine

## 2020-07-04 ENCOUNTER — Other Ambulatory Visit: Payer: Self-pay

## 2020-07-04 DIAGNOSIS — M2550 Pain in unspecified joint: Secondary | ICD-10-CM | POA: Insufficient documentation

## 2020-07-04 DIAGNOSIS — G4700 Insomnia, unspecified: Secondary | ICD-10-CM | POA: Insufficient documentation

## 2020-07-04 DIAGNOSIS — Z515 Encounter for palliative care: Secondary | ICD-10-CM | POA: Diagnosis not present

## 2020-07-04 DIAGNOSIS — R2681 Unsteadiness on feet: Secondary | ICD-10-CM | POA: Insufficient documentation

## 2020-07-04 DIAGNOSIS — Z87891 Personal history of nicotine dependence: Secondary | ICD-10-CM | POA: Insufficient documentation

## 2020-07-04 DIAGNOSIS — C3410 Malignant neoplasm of upper lobe, unspecified bronchus or lung: Secondary | ICD-10-CM | POA: Diagnosis not present

## 2020-07-04 DIAGNOSIS — R0602 Shortness of breath: Secondary | ICD-10-CM | POA: Insufficient documentation

## 2020-07-04 DIAGNOSIS — C787 Secondary malignant neoplasm of liver and intrahepatic bile duct: Secondary | ICD-10-CM | POA: Insufficient documentation

## 2020-07-04 DIAGNOSIS — C3411 Malignant neoplasm of upper lobe, right bronchus or lung: Secondary | ICD-10-CM | POA: Insufficient documentation

## 2020-07-04 DIAGNOSIS — Z801 Family history of malignant neoplasm of trachea, bronchus and lung: Secondary | ICD-10-CM | POA: Insufficient documentation

## 2020-07-04 DIAGNOSIS — J449 Chronic obstructive pulmonary disease, unspecified: Secondary | ICD-10-CM | POA: Insufficient documentation

## 2020-07-04 DIAGNOSIS — Z5112 Encounter for antineoplastic immunotherapy: Secondary | ICD-10-CM | POA: Insufficient documentation

## 2020-07-04 DIAGNOSIS — E46 Unspecified protein-calorie malnutrition: Secondary | ICD-10-CM | POA: Insufficient documentation

## 2020-07-04 DIAGNOSIS — M549 Dorsalgia, unspecified: Secondary | ICD-10-CM | POA: Insufficient documentation

## 2020-07-04 DIAGNOSIS — R5383 Other fatigue: Secondary | ICD-10-CM | POA: Insufficient documentation

## 2020-07-04 DIAGNOSIS — Z79899 Other long term (current) drug therapy: Secondary | ICD-10-CM | POA: Insufficient documentation

## 2020-07-04 DIAGNOSIS — Z8249 Family history of ischemic heart disease and other diseases of the circulatory system: Secondary | ICD-10-CM | POA: Insufficient documentation

## 2020-07-04 DIAGNOSIS — N400 Enlarged prostate without lower urinary tract symptoms: Secondary | ICD-10-CM | POA: Insufficient documentation

## 2020-07-04 DIAGNOSIS — G8929 Other chronic pain: Secondary | ICD-10-CM | POA: Insufficient documentation

## 2020-07-04 DIAGNOSIS — R64 Cachexia: Secondary | ICD-10-CM | POA: Insufficient documentation

## 2020-07-04 DIAGNOSIS — C7951 Secondary malignant neoplasm of bone: Secondary | ICD-10-CM | POA: Insufficient documentation

## 2020-07-04 NOTE — Progress Notes (Signed)
Virtual Visit via telephone note  I connected with Cody [name] on 07/04/20 at 10:30 AM EST by a video enabled telemedicine application and verified that I am speaking with the correct person using two identifiers.   I discussed the limitations of evaluation and management by telemedicine and the availability of in person appointments. The patient expressed understanding and agreed to proceed.  History of Present Illness: Cody [name] is a 64 y.o. male with multiple medical problems including O2 dependent COPD and stage IV non-small cell lung cancer metastatic to spine status post T1-T4 laminectomy at [facility] on 11/13/2019 secondary to spinal cord compression from a large metastatic mass with residual paraparesis.  Patient was hospitalized from 11/29/2019 to 11/30/2019 with hypoxic respiratory failure.  He was discharged home on O2.  Patient is pending initiation of RT.  He was referred to palliative care to help address goals and manage ongoing symptoms.   Observations/Objective: I spoke with patient via telephone.  Patient reports he is doing well.  He denies any significant changes or concerns.  No symptomatic complaints at present.  He reports that appetite is stable.  No weight loss reported.  Patient seems to be tolerating treatments well.  No issues with medications nor need for refills today.  Assessment and Plan: Stage IV NSCLC -follow-up with Dr. Rogue Bussing next week.  Follow Up Instructions: Follow-up MyChart visit in 1 to 2 months   I discussed the assessment and treatment plan with the patient. The patient was provided an opportunity to ask questions and all were answered. The patient agreed with the plan and demonstrated an understanding of the instructions.   The patient was advised to call back or seek an in-person evaluation if the symptoms worsen or if the condition fails to improve as anticipated.  I provided 5 minutes of non-face-to-face time during this encounter.   Irean Hong, NP

## 2020-07-06 ENCOUNTER — Encounter: Payer: Self-pay | Admitting: Internal Medicine

## 2020-07-06 NOTE — Progress Notes (Signed)
Patient called/ pre- screened for appoinment with oncologist, no concerns voiced at this time.

## 2020-07-09 ENCOUNTER — Inpatient Hospital Stay: Payer: Medicare HMO

## 2020-07-09 ENCOUNTER — Other Ambulatory Visit: Payer: Self-pay

## 2020-07-09 ENCOUNTER — Inpatient Hospital Stay (HOSPITAL_BASED_OUTPATIENT_CLINIC_OR_DEPARTMENT_OTHER): Payer: Medicare HMO | Admitting: Internal Medicine

## 2020-07-09 ENCOUNTER — Encounter: Payer: Self-pay | Admitting: Internal Medicine

## 2020-07-09 DIAGNOSIS — C7951 Secondary malignant neoplasm of bone: Secondary | ICD-10-CM | POA: Diagnosis not present

## 2020-07-09 DIAGNOSIS — M549 Dorsalgia, unspecified: Secondary | ICD-10-CM | POA: Diagnosis not present

## 2020-07-09 DIAGNOSIS — R0602 Shortness of breath: Secondary | ICD-10-CM | POA: Diagnosis not present

## 2020-07-09 DIAGNOSIS — G8929 Other chronic pain: Secondary | ICD-10-CM | POA: Diagnosis not present

## 2020-07-09 DIAGNOSIS — Z87891 Personal history of nicotine dependence: Secondary | ICD-10-CM | POA: Diagnosis not present

## 2020-07-09 DIAGNOSIS — Z7189 Other specified counseling: Secondary | ICD-10-CM

## 2020-07-09 DIAGNOSIS — C787 Secondary malignant neoplasm of liver and intrahepatic bile duct: Secondary | ICD-10-CM | POA: Diagnosis not present

## 2020-07-09 DIAGNOSIS — M2550 Pain in unspecified joint: Secondary | ICD-10-CM | POA: Diagnosis not present

## 2020-07-09 DIAGNOSIS — G4700 Insomnia, unspecified: Secondary | ICD-10-CM | POA: Diagnosis not present

## 2020-07-09 DIAGNOSIS — E46 Unspecified protein-calorie malnutrition: Secondary | ICD-10-CM | POA: Diagnosis not present

## 2020-07-09 DIAGNOSIS — C3410 Malignant neoplasm of upper lobe, unspecified bronchus or lung: Secondary | ICD-10-CM

## 2020-07-09 DIAGNOSIS — J449 Chronic obstructive pulmonary disease, unspecified: Secondary | ICD-10-CM | POA: Diagnosis not present

## 2020-07-09 DIAGNOSIS — R64 Cachexia: Secondary | ICD-10-CM | POA: Diagnosis not present

## 2020-07-09 DIAGNOSIS — Z8249 Family history of ischemic heart disease and other diseases of the circulatory system: Secondary | ICD-10-CM | POA: Diagnosis not present

## 2020-07-09 DIAGNOSIS — R5383 Other fatigue: Secondary | ICD-10-CM | POA: Diagnosis not present

## 2020-07-09 DIAGNOSIS — R2681 Unsteadiness on feet: Secondary | ICD-10-CM | POA: Diagnosis not present

## 2020-07-09 DIAGNOSIS — N400 Enlarged prostate without lower urinary tract symptoms: Secondary | ICD-10-CM | POA: Diagnosis not present

## 2020-07-09 DIAGNOSIS — C3411 Malignant neoplasm of upper lobe, right bronchus or lung: Secondary | ICD-10-CM

## 2020-07-09 DIAGNOSIS — Z79899 Other long term (current) drug therapy: Secondary | ICD-10-CM | POA: Diagnosis not present

## 2020-07-09 DIAGNOSIS — Z5112 Encounter for antineoplastic immunotherapy: Secondary | ICD-10-CM | POA: Diagnosis not present

## 2020-07-09 DIAGNOSIS — Z801 Family history of malignant neoplasm of trachea, bronchus and lung: Secondary | ICD-10-CM | POA: Diagnosis not present

## 2020-07-09 LAB — CBC WITH DIFFERENTIAL/PLATELET
Abs Immature Granulocytes: 0.01 10*3/uL (ref 0.00–0.07)
Basophils Absolute: 0.1 10*3/uL (ref 0.0–0.1)
Basophils Relative: 1 %
Eosinophils Absolute: 0.4 10*3/uL (ref 0.0–0.5)
Eosinophils Relative: 9 %
HCT: 45.3 % (ref 39.0–52.0)
Hemoglobin: 15.3 g/dL (ref 13.0–17.0)
Immature Granulocytes: 0 %
Lymphocytes Relative: 20 %
Lymphs Abs: 1 10*3/uL (ref 0.7–4.0)
MCH: 31.8 pg (ref 26.0–34.0)
MCHC: 33.8 g/dL (ref 30.0–36.0)
MCV: 94.2 fL (ref 80.0–100.0)
Monocytes Absolute: 0.6 10*3/uL (ref 0.1–1.0)
Monocytes Relative: 12 %
Neutro Abs: 2.7 10*3/uL (ref 1.7–7.7)
Neutrophils Relative %: 58 %
Platelets: 181 10*3/uL (ref 150–400)
RBC: 4.81 MIL/uL (ref 4.22–5.81)
RDW: 12 % (ref 11.5–15.5)
WBC: 4.7 10*3/uL (ref 4.0–10.5)
nRBC: 0 % (ref 0.0–0.2)

## 2020-07-09 LAB — COMPREHENSIVE METABOLIC PANEL
ALT: 17 U/L (ref 0–44)
AST: 26 U/L (ref 15–41)
Albumin: 4.1 g/dL (ref 3.5–5.0)
Alkaline Phosphatase: 57 U/L (ref 38–126)
Anion gap: 10 (ref 5–15)
BUN: 14 mg/dL (ref 8–23)
CO2: 30 mmol/L (ref 22–32)
Calcium: 9.7 mg/dL (ref 8.9–10.3)
Chloride: 98 mmol/L (ref 98–111)
Creatinine, Ser: 0.73 mg/dL (ref 0.61–1.24)
GFR, Estimated: >60 mL/min (ref >60)
Glucose, Bld: 101 mg/dL — ABNORMAL HIGH (ref 70–99)
Potassium: 3.9 mmol/L (ref 3.5–5.1)
Sodium: 138 mmol/L (ref 135–145)
Total Bilirubin: 0.6 mg/dL (ref 0.3–1.2)
Total Protein: 7 g/dL (ref 6.5–8.1)

## 2020-07-09 MED ORDER — SODIUM CHLORIDE 0.9% FLUSH
10.0000 mL | INTRAVENOUS | Status: DC | PRN
  Administered 2020-07-09: 10 mL via INTRAVENOUS
  Filled 2020-07-09: qty 10

## 2020-07-09 MED ORDER — ZOLEDRONIC ACID 4 MG/5ML IV CONC
3.0000 mg | Freq: Once | INTRAVENOUS | Status: AC
  Administered 2020-07-09: 3 mg via INTRAVENOUS
  Filled 2020-07-09: qty 3.75

## 2020-07-09 MED ORDER — SODIUM CHLORIDE 0.9 % IV SOLN
Freq: Once | INTRAVENOUS | Status: AC
  Filled 2020-07-09: qty 250

## 2020-07-09 MED ORDER — HEPARIN SOD (PORK) LOCK FLUSH 100 UNIT/ML IV SOLN
500.0000 [IU] | Freq: Once | INTRAVENOUS | Status: AC
  Administered 2020-07-09: 500 [IU] via INTRAVENOUS
  Filled 2020-07-09: qty 5

## 2020-07-09 MED ORDER — HEPARIN SOD (PORK) LOCK FLUSH 100 UNIT/ML IV SOLN
INTRAVENOUS | Status: AC
  Filled 2020-07-09: qty 5

## 2020-07-09 MED ORDER — SODIUM CHLORIDE 0.9 % IV SOLN
200.0000 mg | Freq: Once | INTRAVENOUS | Status: AC
  Administered 2020-07-09: 200 mg via INTRAVENOUS
  Filled 2020-07-09: qty 8

## 2020-07-09 NOTE — Assessment & Plan Note (Addendum)
#   Stage IV metastatic non-small cell lung cancer favor adeno; mets to bone; liver.  Currently on Keytruda maintenance.  Oct 10th, 2021- .partial response Cody [name] lung lesion; improved para spinal mass; improvement of the liver lesions]. STABLE.   # Proceed with [place]. Labs today reviewed;  acceptable for treatment today. wil plan repeat scan in Jan 2022/ordered for 6 weeks from now.    # Bone metastases- on zometa 3 mg IVPB.  Every 6 weeks. Ca-9.2; on  ca+ vit D BID;  STABLE.  #Malnutrition/cachexia-continue weight loss/underlying malignancy.  S/p nutrition evaluation-STABLE   # COPD- STABLE; Trelegy.  # Insomnia-continue Seroquel.STABLE  #Debility-spinal cord compression/status post surgery-on physical therapy--STABLE.    # DISPOSITION: # Keytruda; zometa today  # follow up in 3 weeks X- MD;labs- cbc/cmp; Keytruda;Zometa  # follow up in 6 weeks- MD;labs- cbc/cmp; Keytruda ;Zometa; CT CA/P prior Dr.B

## 2020-07-09 NOTE — Progress Notes (Signed)
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   # MAY 26th-Keytrda x2 cycles; [borderline PS] July 7th-carbo Alimta Keytruda cycle #1  # NGS/MOLECULAR TESTS:   # PALLIATIVE CARE EVALUATION:  # PAIN MANAGEMENT:    DIAGNOSIS:   STAGE:         ;  GOALS:  CURRENT/MOST RECENT THERAPY :     Cancer of upper lobe of right lung ([place])  11/23/2019 Initial Diagnosis   Cancer of upper lobe of right lung ([place])   12/21/2019 -  Chemotherapy   The patient had dexamethasone (DECADRON) 4 MG tablet, 1 of 1 cycle, Start date: --, End date: -- palonosetron (ALOXI) injection 0.25 mg, 0.25 mg, Intravenous,  Once, 4 of 4 cycles Administration: 0.25 mg (02/01/2020), 0.25 mg (02/22/2020), 0.25 mg (03/14/2020), 0.25 mg (04/04/2020) PEMEtrexed (ALIMTA) 800 mg in sodium chloride 0.9 % 100 mL chemo infusion, 500 mg/m2 = 800 mg, Intravenous,  Once, 4 of 4 cycles Administration: 800 mg (02/01/2020), 800 mg (02/22/2020), 800 mg (03/14/2020), 800 mg (04/04/2020) CARBOplatin (PARAPLATIN) 330 mg in sodium chloride 0.9 % 250 mL chemo infusion, 330 mg (100 % of original dose 328.8 mg), Intravenous,  Once, 4 of 4 cycles Dose modification:   (original dose 328.8 mg, Cycle 3),   (original dose 328.8 mg, Cycle 5) Administration: 330 mg (02/01/2020), 330 mg (02/22/2020), 330 mg (03/14/2020), 330 mg  (04/04/2020) fosaprepitant (EMEND) 150 mg in sodium chloride 0.9 % 145 mL IVPB, 150 mg, Intravenous,  Once, 4 of 4 cycles Administration: 150 mg (02/01/2020), 150 mg (02/22/2020), 150 mg (03/14/2020), 150 mg (04/04/2020) pembrolizumab (KEYTRUDA) 200 mg in sodium chloride 0.9 % 50 mL chemo infusion, 200 mg, Intravenous, Once, 10 of 12 cycles Administration: 200 mg (12/21/2019), 200 mg (02/01/2020), 200 mg (02/22/2020), 200 mg (03/14/2020), 200 mg (04/04/2020), 200 mg (01/11/2020), 200 mg (04/25/2020), 200 mg (05/16/2020), 200 mg (06/18/2020)  for chemotherapy treatment.     HISTORY OF PRESENTING ILLNESS:  Cody [name] 64 y.o.  male patient with metastatic non-small cell lung cancer; cord compression status post decompressive surgery; currently on Keytruda-is here for follow-up.  Patient denies any diarrhea.  Denies any nausea vomiting.  Patient has been able to walk without any device at home the last few days.  Unsteady gait but thankfully no falls.  Chronic joint pains-not any worse.  Continues to be on 2 L of nasal cannula oxygen.  . Review of Systems  Constitutional: Positive for malaise/fatigue. Negative for chills, diaphoresis and fever.  HENT: Negative for nosebleeds and sore throat.   Eyes: Negative for double vision.  Respiratory: Positive for shortness of breath. Negative for cough, hemoptysis, sputum production and wheezing.   Cardiovascular: Negative for chest pain, palpitations, orthopnea and leg swelling.  Gastrointestinal: Negative for abdominal pain, blood in stool, constipation, heartburn, melena, nausea and vomiting.  Genitourinary: Negative for dysuria, frequency and urgency.  Musculoskeletal: Positive for back pain and joint pain.  Skin: Negative.  Negative  for itching and rash.  Neurological: Negative for dizziness, tingling, focal weakness, weakness and headaches.  Endo/Heme/Allergies: Does not bruise/bleed easily.  Psychiatric/Behavioral: Negative for depression. The patient is not  nervous/anxious and does not have insomnia.      MEDICAL HISTORY:  Past Medical History:  Diagnosis Date  . Allergy   . Cancer of upper lobe of right lung ([place]) 10/2019  . COPD (chronic obstructive pulmonary disease) ([place])   . Prostate enlargement     SURGICAL HISTORY: Past Surgical History:  Procedure Laterality Date  . HERNIA REPAIR Bilateral 1982  . HERNIA REPAIR Right 1994  . IR IMAGING GUIDED PORT INSERTION  02/24/2020  . LAMINECTOMY FOR EXCISION / EVACUATION INTRASPINAL LESION  11/07/2019   T1-T 6 at [facility]     SOCIAL HISTORY: Social History   Socioeconomic History  . Marital status: Married    Spouse name: [name]   . Number of children: 4  . Years of education: Not on file  . Highest education level: Some college, no degree  Occupational History  . Occupation: dispatch and delivery   Tobacco Use  . Smoking status: Former Smoker    Packs/day: 2.00    Years: 30.00    Pack years: 60.00    Quit date: 07/29/2011    Years since quitting: 8.9  . Smokeless tobacco: Never Used  . Tobacco comment: pt vapes  Vaping Use  . Vaping Use: Not on file  Substance and Sexual Activity  . Alcohol use: Yes    Alcohol/week: 5.0 standard drinks    Types: 5 Standard drinks or equivalent per week    Comment: 1-2/day  . Drug use: No  . Sexual activity: Not Currently    Partners: Female  Other Topics Concern  . Not on file  Social History Narrative   Worked in warehouse/ quit smoking in 2015; beer 1/day; in [place]; with wife.    Social Determinants of Health   Financial Resource Strain: Low Risk   . Difficulty of Paying Living Expenses: Not hard at all  Food Insecurity: No Food Insecurity  . Worried About Charity fundraiser in the Last Year: Never true  . Ran Out of Food in the Last Year: Never true  Transportation Needs: No Transportation Needs  . Lack of Transportation (Medical): No  . Lack of Transportation (Non-Medical): No  Physical Activity: Insufficiently Active   . Days of Exercise per Week: 7 days  . Minutes of Exercise per Session: 20 min  Stress: No Stress Concern Present  . Feeling of Stress : Not at all  Social Connections: Socially Integrated  . Frequency of Communication with Friends and Family: More than three times a week  . Frequency of Social Gatherings with Friends and Family: More than three times a week  . Attends Religious Services: More than 4 times per year  . Active Member of Clubs or Organizations: Yes  . Attends Archivist Meetings: More than 4 times per year  . Marital Status: Married  Human resources officer Violence: Not At Risk  . Fear of Current or Ex-Partner: No  . Emotionally Abused: No  . Physically Abused: No  . Sexually Abused: No    FAMILY HISTORY: Family History  Problem Relation Age of Onset  . Heart disease Father   . CVA Mother   . Lung cancer Sister   . Lung cancer Brother     ALLERGIES:  has No Known Allergies.  MEDICATIONS:  Current Outpatient Medications  Medication Sig Dispense  Refill  . acetaminophen (TYLENOL) 500 MG tablet Take 500-1,000 mg by mouth every 6 (six) hours as needed for mild pain or fever.     Marland Kitchen albuterol (VENTOLIN HFA) 108 (90 Base) MCG/ACT inhaler Inhale 2 puffs into the lungs every 4 (four) hours as needed for wheezing or shortness of breath. 18 g 0  . Calcium Carb-Cholecalciferol (OYSTER SHELL CALCIUM) 500-400 MG-UNIT TABS Take 500 tablets by mouth.    . escitalopram (LEXAPRO) 5 MG tablet Take 1 tablet (5 mg total) by mouth daily. am's 90 tablet 1  . Fluticasone-Umeclidin-Vilant (TRELEGY ELLIPTA) 100-62.5-25 MCG/INH AEPB Inhale 1 puff into the lungs daily. 180 each 1  . lidocaine-prilocaine (EMLA) cream Apply 1 application topically as needed. Apply small amount to port site at least 1 hour prior to it being accessed, cover with plastic wrap 30 g 1  . Multiple Vitamins-Minerals (MULTIVITAMIN GUMMIES MENS PO) Take 1 Dose by mouth daily.     . ondansetron (ZOFRAN) 4 MG tablet  Take 1 tablet (4 mg total) by mouth every 8 (eight) hours as needed for nausea or vomiting. 20 tablet 2  . prochlorperazine (COMPAZINE) 10 MG tablet Take 1 tablet (10 mg total) by mouth every 6 (six) hours as needed for nausea or vomiting. 40 tablet 1  . QUEtiapine (SEROQUEL) 25 MG tablet Take 1 tablet (25 mg total) by mouth at bedtime. 90 tablet 0   No current facility-administered medications for this visit.   Facility-Administered Medications Ordered in Other Visits  Medication Dose Route Frequency Provider Last Rate Last Admin  . 0.9 %  sodium chloride infusion   Intravenous Once Charlaine Dalton R, MD      . heparin lock flush 100 unit/mL  500 Units Intravenous Once Cammie Sickle, MD      . pembrolizumab [facility]) 200 mg in sodium chloride 0.9 % 50 mL chemo infusion  200 mg Intravenous Once Charlaine Dalton R, MD      . sodium chloride flush (NS) 0.9 % injection 10 mL  10 mL Intravenous PRN Charlaine Dalton R, MD   10 mL at 07/09/20 0834  . zolendronic acid (ZOMETA) 3 mg in sodium chloride 0.9 % 100 mL IVPB  3 mg Intravenous Once Charlaine Dalton R, MD          .  PHYSICAL EXAMINATION: ECOG PERFORMANCE STATUS: 3 - Symptomatic, >50% confined to bed  Vitals:   07/09/20 0904  BP: 108/77  Pulse: 73  Resp: 16  Temp: 98.1 F (36.7 C)   Filed Weights   07/09/20 0904  Weight: 124 lb 3.2 oz (56.3 kg)    Physical Exam Constitutional:      Comments: Thin built frail appearing male patient.  No acute distress.  HEENT negative.  Accompanied by his daughter  HENT:     Head: Normocephalic and atraumatic.     Mouth/Throat:     Pharynx: No oropharyngeal exudate.  Eyes:     Pupils: Pupils are equal, round, and reactive to light.  Cardiovascular:     Rate and Rhythm: Normal rate and regular rhythm.  Pulmonary:     Effort: No respiratory distress.     Breath sounds: No wheezing.     Comments: Decreased air entry bilaterally. Abdominal:     General: Bowel sounds  are normal. There is no distension.     Palpations: Abdomen is soft. There is no mass.     Tenderness: There is no abdominal tenderness. There is no guarding or rebound.  Musculoskeletal:  General: No tenderness. Normal range of motion.     Cervical back: Normal range of motion and neck supple.  Skin:    General: Skin is warm.  Neurological:     Mental Status: He is alert and oriented to person, place, and time.  Psychiatric:        Mood and Affect: Affect normal.      LABORATORY DATA:  I have reviewed the data as listed Lab Results  Component Value Date   WBC 4.7 07/09/2020   HGB 15.3 07/09/2020   HCT 45.3 07/09/2020   MCV 94.2 07/09/2020   PLT 181 07/09/2020   Recent Labs    03/14/20 0831 04/04/20 0827 04/25/20 0834 05/16/20 0824 06/06/20 0833 06/18/20 0826 07/09/20 0834  NA 139 137 139   < > 136 139 138  K 3.9 4.3 4.2   < > 4.1 4.0 3.9  CL 103 99 102   < > 98 101 98  CO2 29 28 28    < > 29 31 30   GLUCOSE 104* 181* 102*   < > 93 96 101*  BUN 19 19 15    < > 17 14 14   CREATININE 0.56* 0.61 0.60*   < > 0.66 0.57* 0.73  CALCIUM 8.8* 8.8* 8.8*   < > 9.2 8.6* 9.7  GFRNONAA >60 >60 >60   < > >60 >60 >60  GFRAA >60 >60 >60  --   --   --   --   PROT 6.9 6.7 6.4*   < > 6.9 6.8 7.0  ALBUMIN 3.8 3.7 3.7   < > 3.9 3.7 4.1  AST 24 29 23    < > 26 24 26   ALT 20 20 19    < > 19 18 17   ALKPHOS 82 77 66   < > 61 58 57  BILITOT 0.5 0.6 0.6   < > 0.9 0.7 0.6   < > = values in this interval not displayed.    RADIOGRAPHIC STUDIES: I have personally reviewed the radiological images as listed and agreed with the findings in the report. No results found.  ASSESSMENT & PLAN:   Cancer of upper lobe of right lung ([place]) # Stage IV metastatic non-small cell lung cancer favor adeno; mets to bone; liver.  Currently on Keytruda maintenance.  Oct 10th, 2021- .partial response Priscille Loveless lung lesion; improved para spinal mass; improvement of the liver lesions]. STABLE.   # Proceed with  [place]. Labs today reviewed;  acceptable for treatment today. wil plan repeat scan in Jan 2022/ordered for 6 weeks from now.    # Bone metastases- on zometa 3 mg IVPB.  Every 6 weeks. Ca-9.2; on  ca+ vit D BID;  STABLE.  #Malnutrition/cachexia-continue weight loss/underlying malignancy.  S/p nutrition evaluation-STABLE   # COPD- STABLE; Trelegy.  # Insomnia-continue Seroquel.STABLE  #Debility-spinal cord compression/status post surgery-on physical therapy--STABLE.    # DISPOSITION: # Keytruda; zometa today  # follow up in 3 weeks X- MD;labs- cbc/cmp; Keytruda;Zometa  # follow up in 6 weeks- MD;labs- cbc/cmp; Keytruda ;Zometa; CT CA/P prior Dr.B    All questions were answered. The patient knows to call the clinic with any problems, questions or concerns.    Cammie Sickle, MD 07/09/2020 9:51 AM

## 2020-07-09 NOTE — Progress Notes (Signed)
1137- Patient tolerated treatment well. Patient stable and discharged to home at this time.

## 2020-07-10 ENCOUNTER — Other Ambulatory Visit: Payer: Self-pay

## 2020-07-10 ENCOUNTER — Ambulatory Visit (INDEPENDENT_AMBULATORY_CARE_PROVIDER_SITE_OTHER): Payer: Medicare HMO | Admitting: Family Medicine

## 2020-07-10 ENCOUNTER — Encounter: Payer: Self-pay | Admitting: Family Medicine

## 2020-07-10 VITALS — BP 123/72 | HR 74 | Temp 98.0°F | Resp 14 | Ht 69.0 in | Wt 124.0 lb

## 2020-07-10 DIAGNOSIS — N400 Enlarged prostate without lower urinary tract symptoms: Secondary | ICD-10-CM | POA: Diagnosis not present

## 2020-07-10 DIAGNOSIS — C3411 Malignant neoplasm of upper lobe, right bronchus or lung: Secondary | ICD-10-CM

## 2020-07-10 DIAGNOSIS — F419 Anxiety disorder, unspecified: Secondary | ICD-10-CM

## 2020-07-10 DIAGNOSIS — J432 Centrilobular emphysema: Secondary | ICD-10-CM | POA: Diagnosis not present

## 2020-07-10 DIAGNOSIS — I700 Atherosclerosis of aorta: Secondary | ICD-10-CM

## 2020-07-10 DIAGNOSIS — R2681 Unsteadiness on feet: Secondary | ICD-10-CM | POA: Diagnosis not present

## 2020-07-10 DIAGNOSIS — Z515 Encounter for palliative care: Secondary | ICD-10-CM

## 2020-07-10 DIAGNOSIS — G4700 Insomnia, unspecified: Secondary | ICD-10-CM | POA: Diagnosis not present

## 2020-07-10 DIAGNOSIS — C7951 Secondary malignant neoplasm of bone: Secondary | ICD-10-CM | POA: Diagnosis not present

## 2020-07-10 DIAGNOSIS — F341 Dysthymic disorder: Secondary | ICD-10-CM

## 2020-07-10 DIAGNOSIS — R972 Elevated prostate specific antigen [PSA]: Secondary | ICD-10-CM

## 2020-07-10 DIAGNOSIS — M7541 Impingement syndrome of right shoulder: Secondary | ICD-10-CM

## 2020-07-10 DIAGNOSIS — R69 Illness, unspecified: Secondary | ICD-10-CM | POA: Diagnosis not present

## 2020-07-10 DIAGNOSIS — D692 Other nonthrombocytopenic purpura: Secondary | ICD-10-CM | POA: Diagnosis not present

## 2020-07-10 MED ORDER — ESCITALOPRAM OXALATE 5 MG PO TABS: 5.0000 mg | ORAL_TABLET | Freq: Every day | ORAL | 1 refills | Status: DC

## 2020-07-10 MED ORDER — QUETIAPINE FUMARATE 25 MG PO TABS
25.0000 mg | ORAL_TABLET | Freq: Every day | ORAL | 1 refills | Status: DC
Start: 2020-07-10 — End: 2021-01-14

## 2020-07-10 NOTE — Progress Notes (Signed)
Name: Cody [name]   MRN: [id number]    DOB: 05-23-1956   Date:07/10/2020       Progress Note  Subjective  Chief Complaint  Follow Up  HPI  Depression.Anxiety: he is doing well on Seroquel for sleep takes about 30 minutes, he is doing much better with lexapro in am's, and it has really helped with his mood. He is coping well with cancer therapy. Wife is back at work, kids are still supportive, able to move more independently  around the house . He wants to continue medication   Lung Cancer/Emphysema/hypoxia/on oxygen: he finished radiation therapy, going finished chemo and now on  Immunotherapy Keytruda, no side effects, appetite is okay. 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 He has been using Trelegy and seems to be stable on medication. He stopped PT, but is doing well moving around the house without the walker. He states no cough or wheezing he has stable SOB  Chronic right shoulder pain: going on for a long time, but getting worse and now he has difficulty reaching behind his back, pain with abduction and his right hand is getting weak. Discussed PT, steroid injection or referral to surgeon . He will return for steroid injection  Malnutrition:weight is stable, he is trying to eat at least twice daily  Discussed high protein diet with patient again  His last A1C has been stable, last level at 5.8 %, glucose spikes during infusion during cancer center, he gets decadron   Atherosclerosis: under palliative care at this time, we will not give statin therapy. Unchanged   BPH with elevated PSA: not doing any further investigation because of palliative treatment for lung cancer . He has urgency only and stable, he does not want to try any new medications at this time  Senile purpura: both arms, he states stable   Patient Active Problem List   Diagnosis Date Noted  . Senile purpura ([place]) 04/10/2020  . S/P spinal fusion  12/23/2019  . Goals of care, counseling/discussion 12/11/2019  . Hypoxia 11/30/2019  . 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 COPD with acute exacerbation ([place]) 11/29/2019  . Cancer of upper lobe of right lung ([place]) 11/23/2019  . Metastatic cancer to spine ([place]) 11/01/2019  . BPH with elevated PSA 07/16/2015  . COPD bronchitis 07/16/2015  . Seasonal and perennial allergic rhinitis 04/24/2010    Past Surgical History:  Procedure Laterality Date  . HERNIA REPAIR Bilateral 1982  . HERNIA REPAIR Right 1994  . IR IMAGING GUIDED PORT INSERTION  02/24/2020  . LAMINECTOMY FOR EXCISION / EVACUATION INTRASPINAL LESION  11/07/2019   T1-T 6 at [facility]     Family History  Problem Relation Age of Onset  . Heart disease Father   . CVA Mother   . Lung cancer Sister   . Lung cancer Brother     Social History   Tobacco Use  . Smoking status: Former Smoker    Packs/day: 2.00    Years: 30.00    Pack years: 60.00    Quit date: 07/29/2011    Years since quitting: 8.9  . Smokeless tobacco: Never Used  . Tobacco comment: pt vapes  Substance Use Topics  . Alcohol use: Yes    Alcohol/week: 5.0 standard drinks    Types: 5 Standard drinks or equivalent per week    Comment: 1-2/day  Current Outpatient Medications:  .  acetaminophen (TYLENOL) 500 MG tablet, Take 500-1,000 mg by mouth every 6 (six) hours as needed for mild pain or fever. , Disp: , Rfl:  .  albuterol (VENTOLIN HFA) 108 (90 Base) MCG/ACT inhaler, Inhale 2 puffs into the lungs every 4 (four) hours as needed for wheezing or shortness of breath., Disp: 18 g, Rfl: 0 .  Calcium Carb-Cholecalciferol (OYSTER SHELL CALCIUM) 500-400 MG-UNIT TABS, Take 500 tablets by mouth., Disp: , Rfl:  .  escitalopram (LEXAPRO) 5 MG tablet, Take 1 tablet (5 mg total) by mouth daily. am's, Disp: 90 tablet, Rfl: 1 .  Fluticasone-Umeclidin-Vilant (TRELEGY ELLIPTA) 100-62.5-25 MCG/INH AEPB, Inhale 1 puff into the lungs daily., Disp:  180 each, Rfl: 1 .  lidocaine-prilocaine (EMLA) cream, Apply 1 application topically as needed. Apply small amount to port site at least 1 hour prior to it being accessed, cover with plastic wrap, Disp: 30 g, Rfl: 1 .  Multiple Vitamins-Minerals (MULTIVITAMIN GUMMIES MENS PO), Take 1 Dose by mouth daily. , Disp: , Rfl:  .  ondansetron (ZOFRAN) 4 MG tablet, Take 1 tablet (4 mg total) by mouth every 8 (eight) hours as needed for nausea or vomiting., Disp: 20 tablet, Rfl: 2 .  prochlorperazine (COMPAZINE) 10 MG tablet, Take 1 tablet (10 mg total) by mouth every 6 (six) hours as needed for nausea or vomiting., Disp: 40 tablet, Rfl: 1 .  QUEtiapine (SEROQUEL) 25 MG tablet, Take 1 tablet (25 mg total) by mouth at bedtime., Disp: 90 tablet, Rfl: 0  No Known Allergies  I personally reviewed active problem list, medication list, allergies, family history, social history, health maintenance with the patient/caregiver today.   ROS  Constitutional: Negative for fever or weight change.  Respiratory: Negative for cough , positive  shortness of breath.   Cardiovascular: Negative for chest pain or palpitations.  Gastrointestinal: Negative for abdominal pain, no bowel changes.  Musculoskeletal: Negative for gait problem or joint swelling.  Skin: Negative for rash.  Neurological: Negative for dizziness or headache.  No other specific complaints in a complete review of systems (except as listed in HPI above).  Objective  Vitals:   07/10/20 1355  BP: 123/72  Pulse: 74  Resp: 14  Temp: 98 F (36.7 C)  TempSrc: Oral  SpO2: 100%  Weight: 127 lb (57.6 kg)  Height: 5\' 9"  (1.753 m)    Body mass index is 18.75 kg/m.  Physical Exam  Constitutional: Patient appears well-developed and malnourished No distress.  HEENT: head atraumatic, normocephalic, pupils equal and reactive to light,  neck supple Cardiovascular: Normal rate, regular rhythm and normal heart sounds.  No murmur heard. No BLE  edema. Pulmonary/Chest: Effort normal and breath sounds normal. No respiratory distress. Abdominal: Soft.  There is no tenderness. Psychiatric: Patient has a normal mood and affect. behavior is normal. Judgment and thought content normal. Muscular skeletal: pain with abduction and internal rotation of right shoulder, no redness or increase in warmth. Normal grip and sensation   Recent Results (from the past 2160 hour(s))  Comprehensive metabolic panel     Status: Abnormal   Collection Time: 04/25/20  8:34 AM  Result Value Ref Range   Sodium 139 135 - 145 mmol/L   Potassium 4.2 3.5 - 5.1 mmol/L   Chloride 102 98 - 111 mmol/L   CO2 28 22 - 32 mmol/L   Glucose, Bld 102 (H) 70 - 99 mg/dL    Comment: Glucose reference range applies only to samples taken after fasting  for at least 8 hours.   BUN 15 8 - 23 mg/dL   Creatinine, Ser 0.60 (L) 0.61 - 1.24 mg/dL   Calcium 8.8 (L) 8.9 - 10.3 mg/dL   Total Protein 6.4 (L) 6.5 - 8.1 g/dL   Albumin 3.7 3.5 - 5.0 g/dL   AST 23 15 - 41 U/L   ALT 19 0 - 44 U/L   Alkaline Phosphatase 66 38 - 126 U/L   Total Bilirubin 0.6 0.3 - 1.2 mg/dL   GFR calc non Af Amer >60 >60 mL/min   GFR calc Af Amer >60 >60 mL/min   Anion gap 9 5 - 15    Comment: Performed at [facility], [address] [place], [place] [address]  CBC with Differential/Platelet     Status: Abnormal   Collection Time: 04/25/20  8:34 AM  Result Value Ref Range   WBC 3.9 (L) 4.0 - 10.5 K/uL   RBC 4.33 4.22 - 5.81 MIL/uL   Hemoglobin 13.5 13.0 - 17.0 g/dL   HCT 39.0 39.0 - 52.0 %   MCV 90.1 80.0 - 100.0 fL   MCH 31.2 26.0 - 34.0 pg   MCHC 34.6 30.0 - 36.0 g/dL   RDW 17.0 (H) 11.5 - 15.5 %   Platelets 207 150 - 400 K/uL   nRBC 0.0 0.0 - 0.2 %   Neutrophils Relative % 58 %   Neutro Abs 2.3 1.7 - 7.7 K/uL   Lymphocytes Relative 21 %   Lymphs Abs 0.8 0.7 - 4.0 K/uL   Monocytes Relative 16 %   Monocytes Absolute 0.6 0.1 - 1.0 K/uL   Eosinophils Relative 4 %   Eosinophils  Absolute 0.2 0.0 - 0.5 K/uL   Basophils Relative 1 %   Basophils Absolute 0.0 0.0 - 0.1 K/uL   Immature Granulocytes 0 %   Abs Immature Granulocytes 0.01 0.00 - 0.07 K/uL    Comment: Performed at [facility], [address] [place], [place] [address]  CBC with Differential/Platelet     Status: None   Collection Time: 05/16/20  8:24 AM  Result Value Ref Range   WBC 4.9 4.0 - 10.5 K/uL   RBC 4.58 4.22 - 5.81 MIL/uL   Hemoglobin 14.5 13.0 - 17.0 g/dL   HCT 42.2 39.0 - 52.0 %   MCV 92.1 80.0 - 100.0 fL   MCH 31.7 26.0 - 34.0 pg   MCHC 34.4 30.0 - 36.0 g/dL   RDW 14.8 11.5 - 15.5 %   Platelets 201 150 - 400 K/uL   nRBC 0.0 0.0 - 0.2 %   Neutrophils Relative % 64 %   Neutro Abs 3.1 1.7 - 7.7 K/uL   Lymphocytes Relative 17 %   Lymphs Abs 0.8 0.7 - 4.0 K/uL   Monocytes Relative 11 %   Monocytes Absolute 0.5 0.1 - 1.0 K/uL   Eosinophils Relative 6 %   Eosinophils Absolute 0.3 0.0 - 0.5 K/uL   Basophils Relative 1 %   Basophils Absolute 0.1 0.0 - 0.1 K/uL   Immature Granulocytes 1 %   Abs Immature Granulocytes 0.07 0.00 - 0.07 K/uL    Comment: Performed at [facility], [address] [place], [place] [address]  Comprehensive metabolic panel     Status: Abnormal   Collection Time: 05/16/20  8:24 AM  Result Value Ref Range   Sodium 138 135 - 145 mmol/L   Potassium 3.9 3.5 - 5.1 mmol/L   Chloride 98 98 - 111 mmol/L  CO2 30 22 - 32 mmol/L   Glucose, Bld 176 (H) 70 - 99 mg/dL    Comment: Glucose reference range applies only to samples taken after fasting for at least 8 hours.   BUN 18 8 - 23 mg/dL   Creatinine, Ser 0.56 (L) 0.61 - 1.24 mg/dL   Calcium 9.0 8.9 - 10.3 mg/dL   Total Protein 6.9 6.5 - 8.1 g/dL   Albumin 3.8 3.5 - 5.0 g/dL   AST 28 15 - 41 U/L   ALT 19 0 - 44 U/L   Alkaline Phosphatase 71 38 - 126 U/L   Total Bilirubin 1.0 0.3 - 1.2 mg/dL   GFR, Estimated >60 >60 mL/min   Anion gap 10 5 - 15    Comment: Performed at [facility], [address] [place], [place] [address]  TSH     Status: None   Collection Time: 06/06/20  8:33 AM  Result Value Ref Range   TSH 3.759 0.350 - 4.500 uIU/mL    Comment: Performed by a 3rd Generation assay with a functional sensitivity of <=0.01 uIU/mL. Performed at [facility], [address] [place], [place] [address]   Comprehensive metabolic panel     Status: None   Collection Time: 06/06/20  8:33 AM  Result Value Ref Range   Sodium 136 135 - 145 mmol/L   Potassium 4.1 3.5 - 5.1 mmol/L   Chloride 98 98 - 111 mmol/L   CO2 29 22 - 32 mmol/L   Glucose, Bld 93 70 - 99 mg/dL    Comment: Glucose reference range applies only to samples taken after fasting for at least 8 hours.   BUN 17 8 - 23 mg/dL   Creatinine, Ser 0.66 0.61 - 1.24 mg/dL   Calcium 9.2 8.9 - 10.3 mg/dL   Total Protein 6.9 6.5 - 8.1 g/dL   Albumin 3.9 3.5 - 5.0 g/dL   AST 26 15 - 41 U/L   ALT 19 0 - 44 U/L   Alkaline Phosphatase 61 38 - 126 U/L   Total Bilirubin 0.9 0.3 - 1.2 mg/dL   GFR, Estimated >60 >60 mL/min    Comment: (NOTE) Calculated using the CKD-EPI Creatinine Equation (2021)    Anion gap 9 5 - 15    Comment: Performed at [facility], [address] [place], [place] [address]  CBC with Differential/Platelet     Status: None   Collection Time: 06/06/20  8:33 AM  Result Value Ref Range   WBC 5.2 4.0 - 10.5 K/uL   RBC 4.34 4.22 - 5.81 MIL/uL   Hemoglobin 14.1 13.0 - 17.0 g/dL   HCT 41.0 39.0 - 52.0 %   MCV 94.5 80.0 - 100.0 fL   MCH 32.5 26.0 - 34.0 pg   MCHC 34.4 30.0 - 36.0 g/dL   RDW 12.7 11.5 - 15.5 %   Platelets 180 150 - 400 K/uL   nRBC 0.0 0.0 - 0.2 %   Neutrophils Relative % 65 %   Neutro Abs 3.4 1.7 - 7.7 K/uL   Lymphocytes Relative 15 %   Lymphs Abs 0.8 0.7 - 4.0 K/uL   Monocytes Relative 11 %   Monocytes Absolute 0.6 0.1 - 1.0 K/uL   Eosinophils Relative 8 %   Eosinophils Absolute 0.4 0.0 - 0.5 K/uL   Basophils Relative 1 %   Basophils Absolute 0.1 0.0 - 0.1 K/uL    Immature Granulocytes 0 %   Abs Immature Granulocytes 0.01 0.00 -  0.07 K/uL    Comment: Performed at [facility], [address] [place], [place] [address]  Comprehensive metabolic panel     Status: Abnormal   Collection Time: 06/18/20  8:26 AM  Result Value Ref Range   Sodium 139 135 - 145 mmol/L   Potassium 4.0 3.5 - 5.1 mmol/L   Chloride 101 98 - 111 mmol/L   CO2 31 22 - 32 mmol/L   Glucose, Bld 96 70 - 99 mg/dL    Comment: Glucose reference range applies only to samples taken after fasting for at least 8 hours.   BUN 14 8 - 23 mg/dL   Creatinine, Ser 0.57 (L) 0.61 - 1.24 mg/dL   Calcium 8.6 (L) 8.9 - 10.3 mg/dL   Total Protein 6.8 6.5 - 8.1 g/dL   Albumin 3.7 3.5 - 5.0 g/dL   AST 24 15 - 41 U/L   ALT 18 0 - 44 U/L   Alkaline Phosphatase 58 38 - 126 U/L   Total Bilirubin 0.7 0.3 - 1.2 mg/dL   GFR, Estimated >60 >60 mL/min    Comment: (NOTE) Calculated using the CKD-EPI Creatinine Equation (2021)    Anion gap 7 5 - 15    Comment: Performed at [facility], [address] [place], [place] [address]  CBC with Differential/Platelet     Status: None   Collection Time: 06/18/20  8:26 AM  Result Value Ref Range   WBC 4.8 4.0 - 10.5 K/uL   RBC 4.36 4.22 - 5.81 MIL/uL   Hemoglobin 13.8 13.0 - 17.0 g/dL   HCT 41.2 39.0 - 52.0 %   MCV 94.5 80.0 - 100.0 fL   MCH 31.7 26.0 - 34.0 pg   MCHC 33.5 30.0 - 36.0 g/dL   RDW 12.4 11.5 - 15.5 %   Platelets 185 150 - 400 K/uL   nRBC 0.0 0.0 - 0.2 %   Neutrophils Relative % 57 %   Neutro Abs 2.7 1.7 - 7.7 K/uL   Lymphocytes Relative 18 %   Lymphs Abs 0.9 0.7 - 4.0 K/uL   Monocytes Relative 14 %   Monocytes Absolute 0.7 0.1 - 1.0 K/uL   Eosinophils Relative 10 %   Eosinophils Absolute 0.5 0.0 - 0.5 K/uL   Basophils Relative 1 %   Basophils Absolute 0.1 0.0 - 0.1 K/uL   Immature Granulocytes 0 %   Abs Immature Granulocytes 0.01 0.00 - 0.07 K/uL    Comment: Performed at [facility], [address]  [place], [place] [address]  Comprehensive metabolic panel     Status: Abnormal   Collection Time: 07/09/20  8:34 AM  Result Value Ref Range   Sodium 138 135 - 145 mmol/L   Potassium 3.9 3.5 - 5.1 mmol/L   Chloride 98 98 - 111 mmol/L   CO2 30 22 - 32 mmol/L   Glucose, Bld 101 (H) 70 - 99 mg/dL    Comment: Glucose reference range applies only to samples taken after fasting for at least 8 hours.   BUN 14 8 - 23 mg/dL   Creatinine, Ser 0.73 0.61 - 1.24 mg/dL   Calcium 9.7 8.9 - 10.3 mg/dL   Total Protein 7.0 6.5 - 8.1 g/dL   Albumin 4.1 3.5 - 5.0 g/dL   AST 26 15 - 41 U/L   ALT 17 0 - 44 U/L   Alkaline Phosphatase 57 38 - 126 U/L   Total Bilirubin 0.6 0.3 - 1.2 mg/dL   GFR, Estimated >60 >  60 mL/min    Comment: (NOTE) Calculated using the CKD-EPI Creatinine Equation (2021)    Anion gap 10 5 - 15    Comment: Performed at [facility], [address] [place], [place] [address]  CBC with Differential/Platelet     Status: None   Collection Time: 07/09/20  8:34 AM  Result Value Ref Range   WBC 4.7 4.0 - 10.5 K/uL   RBC 4.81 4.22 - 5.81 MIL/uL   Hemoglobin 15.3 13.0 - 17.0 g/dL   HCT 45.3 39.0 - 52.0 %   MCV 94.2 80.0 - 100.0 fL   MCH 31.8 26.0 - 34.0 pg   MCHC 33.8 30.0 - 36.0 g/dL   RDW 12.0 11.5 - 15.5 %   Platelets 181 150 - 400 K/uL   nRBC 0.0 0.0 - 0.2 %   Neutrophils Relative % 58 %   Neutro Abs 2.7 1.7 - 7.7 K/uL   Lymphocytes Relative 20 %   Lymphs Abs 1.0 0.7 - 4.0 K/uL   Monocytes Relative 12 %   Monocytes Absolute 0.6 0.1 - 1.0 K/uL   Eosinophils Relative 9 %   Eosinophils Absolute 0.4 0.0 - 0.5 K/uL   Basophils Relative 1 %   Basophils Absolute 0.1 0.0 - 0.1 K/uL   Immature Granulocytes 0 %   Abs Immature Granulocytes 0.01 0.00 - 0.07 K/uL    Comment: Performed at [facility] [place], [place] [address]    PHQ2/9: Depression screen [facility] 2/9 07/10/2020 04/10/2020 01/05/2020 12/08/2019 11/25/2019  Decreased Interest 0 0 0 1 0  Down,  Depressed, Hopeless 0 0 0 1 0  PHQ - 2 Score 0 0 0 2 0  Altered sleeping 1 0 1 2 0  Tired, decreased energy 1 0 0 1 0  Change in appetite 1 0 0 0 0  Feeling bad or failure about yourself  0 0 0 1 0  Trouble concentrating 0 0 0 0 0  Moving slowly or fidgety/restless 0 0 0 0 0  Suicidal thoughts 0 0 0 0 0  PHQ-9 Score 3 0 1 6 0  Difficult doing work/chores Not difficult at all - Not difficult at all Not difficult at all -    phq 9 is negative   Fall Risk: Fall Risk  07/10/2020 04/10/2020 01/05/2020 12/08/2019 11/25/2019  Falls in the past year? 0 0 0 0 0  Number falls in past yr: 0 0 - 0 0  Injury with Fall? 0 0 - 0 0  Risk for fall due to : Impaired mobility - Impaired mobility;Impaired balance/gait - -     Functional Status Survey: Is the patient deaf or have difficulty hearing?: No Does the patient have difficulty seeing, even when wearing glasses/contacts?: No Does the patient have difficulty concentrating, remembering, or making decisions?: No Does the patient have difficulty walking or climbing stairs?: Yes Does the patient have difficulty dressing or bathing?: Yes Does the patient have difficulty doing errands alone such as visiting a doctor's office or shopping?: Yes    Assessment & Plan  1. Dysthymia  - escitalopram (LEXAPRO) 5 MG tablet; Take 1 tablet (5 mg total) by mouth daily. am's  Dispense: 90 tablet; Refill: 1  2. Anxiety  - escitalopram (LEXAPRO) 5 MG tablet; Take 1 tablet (5 mg total) by mouth daily. am's  Dispense: 90 tablet; Refill: 1  3. Centrilobular emphysema ([place])  He quit smoking   4. Insomnia, unspecified type  - QUEtiapine (SEROQUEL) 25 MG  tablet; Take 1 tablet (25 mg total) by mouth at bedtime.  Dispense: 90 tablet; Refill: 1  5. Metastatic cancer to spine ([place])   6. Gait instability  Doing slightly better   7. Atherosclerosis of aorta [facility])  Not on statin, palliative care   8. BPH with elevated PSA   9. Senile purpura  ([facility])  Stable  10. Cancer of upper lobe of right lung ([place])  On immunotherapy   12. Impingement syndrome of right shoulder

## 2020-07-24 ENCOUNTER — Telehealth: Payer: Self-pay | Admitting: *Deleted

## 2020-07-24 NOTE — Telephone Encounter (Signed)
Patient's wife tested positive for covid. I asked the patient to contact the [place] testing site at [phone] to get tested asap. He will contact the testing site. He is currently asymptomatic. Patient's wife has mild covid-19 symptoms of headaches"  Note: Patient has a [place] and zometa planned for Monday 1/3 with Dr. Janese Banks.  Dr. Alfonso Patten- patient will get tested today. When should we r/s patient's treatment given the exposure.

## 2020-07-24 NOTE — Telephone Encounter (Signed)
Spoke with patient. He was unable to get any apts with [facility] until 1/3. He will take a home test now and get a test done at a different testing site tomorrow morning. I explained to him that if the tests are negative, then Dr. Janese Banks would like him to retest in approximately 5 days. He gave verbal understanding of the plan of care. Colette, please move his treatment to 08/03/20 for lab/md/keytruda and zometa  with Dr. Alfonso Patten. This will give him plenty of time to r/s and quarantine and get testing results back.

## 2020-07-24 NOTE — Telephone Encounter (Signed)
Is he vaccinated and boosted? Is his wife isolating from him? If he is isolating from his wife at this point, recommend testing 5 days after th exposure. If he tests negative, he can come. I would imagine that will not be back on 1/3. If it is back, he can come for 1/3. If its not back push it out by 1 more week. Would not test today. Test after 5 days

## 2020-07-24 NOTE — Telephone Encounter (Signed)
Yes, he is vaccinated but did not receive his covid booster injection at this time.  He is self- isolating from his wife as best as he can. His wife was tested positive yesterday.

## 2020-07-26 DIAGNOSIS — Z03818 Encounter for observation for suspected exposure to other biological agents ruled out: Secondary | ICD-10-CM | POA: Diagnosis not present

## 2020-07-26 DIAGNOSIS — Z1152 Encounter for screening for COVID-19: Secondary | ICD-10-CM | POA: Diagnosis not present

## 2020-07-26 NOTE — Telephone Encounter (Signed)
Update- Patient went to get rapid testing yesterday. He is still waiting on testing results. He should get these in the next 24 hours. He is still isolating from his wife. He is currently asymptomatic. He will send [place] a mychart message with updates when available.

## 2020-07-27 ENCOUNTER — Encounter: Payer: Self-pay | Admitting: *Deleted

## 2020-07-30 ENCOUNTER — Inpatient Hospital Stay: Payer: Medicare HMO | Admitting: Oncology

## 2020-07-30 ENCOUNTER — Telehealth: Payer: Self-pay | Admitting: *Deleted

## 2020-07-30 ENCOUNTER — Inpatient Hospital Stay: Payer: Medicare HMO

## 2020-07-30 NOTE — Telephone Encounter (Signed)
Cody [name] - see my phone notes- updated today. Lauren will reach out to patient.

## 2020-07-30 NOTE — Telephone Encounter (Signed)
I spoke to patient. Onset 12/31. He is on treatment for lung cancer and history of copd. He has been vaccinated but no booster. Referral for anti-virals and mab sent to mab infusions clinic.

## 2020-07-30 NOTE — Telephone Encounter (Signed)
Per patient - his rapid testing was negative. I will ask patient to send [place] a copy of the results. Do you want him to retest?

## 2020-07-30 NOTE — Telephone Encounter (Signed)
Update- per Dr. Grayland Ormond- patient tested positive for covid on retest. Lauren, NP will contact patient to consider mab therapy.

## 2020-07-30 NOTE — Telephone Encounter (Signed)
Treatment needs to be r/s out 2 weeks. Colette, please r/s patient's treatment.

## 2020-07-30 NOTE — Telephone Encounter (Addendum)
Patient called reporting that he has tested positive for COVID and is taking mucinex etc. Asking if he needs to be doing anything else. Please advise. I told him that he may qualify for MAB treatment and would send message to NP. He has an appointment 08/03/20 that should be rescheduled as well

## 2020-07-30 NOTE — Telephone Encounter (Signed)
Please cancel his treatment appt on 1/7, reschedule with Dr.B once he recovers.

## 2020-07-31 ENCOUNTER — Encounter: Payer: Self-pay | Admitting: Oncology

## 2020-07-31 ENCOUNTER — Other Ambulatory Visit: Payer: Self-pay | Admitting: Oncology

## 2020-07-31 ENCOUNTER — Telehealth: Payer: Self-pay | Admitting: Oncology

## 2020-07-31 ENCOUNTER — Telehealth: Payer: Self-pay

## 2020-07-31 DIAGNOSIS — U071 COVID-19: Secondary | ICD-10-CM

## 2020-07-31 MED ORDER — NIRMATRELVIR/RITONAVIR (PAXLOVID)TABLET: 3.0000 | ORAL_TABLET | Freq: Two times a day (BID) | ORAL | 0 refills | Status: AC

## 2020-07-31 NOTE — Telephone Encounter (Signed)
Patient was prescribed oral covid treatment Paxlovid and treatment note was reviewed. Medication has been received by [place] and reviewed for appropriateness.  Drug Interactions or Dosage Adjustments Noted: Patient was instructed to hold Trelegy while taking Paxlovid.  Delivery Method: Pick-up  Patient contacted for counseling on 07/31/20 and verbalized understanding.   Delivery or Pick-Up Date: 07/31/20   Carolynne Edouard 07/31/2020, 2:42 PM [facility] Outpatient Pharmacist Phone# [phone]

## 2020-07-31 NOTE — Telephone Encounter (Signed)
Outpatient Oral COVID Treatment Note  I connected with Cody [name] on 07/31/2020/12:47 PM by telephone and verified that I am speaking with the correct person using two identifiers.  I discussed the limitations, risks, security, and privacy concerns of performing an evaluation and management service by telephone and the availability of in person appointments. I also discussed with the patient that there may be a patient responsible charge related to this service. The patient expressed understanding and agreed to proceed.  Patient location: Home Provider location: Clinic   Diagnosis: COVID-19 infection  Purpose of visit: Discussion of potential use of Molnupiravir or Paxlovid, a new treatment for mild to moderate COVID-19 viral infection in non-hospitalized patients.   Subjective: Patient is a 65 y.o. male who has been diagnosed with COVID 19 viral infection.  Their symptoms began on 07/27/20.   Past Medical History:  Diagnosis Date  . Allergy   . Cancer of upper lobe of right lung ([place]) 10/2019  . COPD (chronic obstructive pulmonary disease) ([place])   . Prostate enlargement     No Known Allergies  Objective: Patient sounds well.  They are in no apparent distress.  Breathing is non labored.  Mood and behavior are normal.  Laboratory Data:  Recent Results (from the past 2160 hour(s))  CBC with Differential/Platelet     Status: None   Collection Time: 05/16/20  8:24 AM  Result Value Ref Range   WBC 4.9 4.0 - 10.5 K/uL   RBC 4.58 4.22 - 5.81 MIL/uL   Hemoglobin 14.5 13.0 - 17.0 g/dL   HCT 42.2 39.0 - 52.0 %   MCV 92.1 80.0 - 100.0 fL   MCH 31.7 26.0 - 34.0 pg   MCHC 34.4 30.0 - 36.0 g/dL   RDW 14.8 11.5 - 15.5 %   Platelets 201 150 - 400 K/uL   nRBC 0.0 0.0 - 0.2 %   Neutrophils Relative % 64 %   Neutro Abs 3.1 1.7 - 7.7 K/uL   Lymphocytes Relative 17 %   Lymphs Abs 0.8 0.7 - 4.0 K/uL   Monocytes Relative 11 %   Monocytes Absolute 0.5 0.1 - 1.0 K/uL   Eosinophils Relative 6 %    Eosinophils Absolute 0.3 0.0 - 0.5 K/uL   Basophils Relative 1 %   Basophils Absolute 0.1 0.0 - 0.1 K/uL   Immature Granulocytes 1 %   Abs Immature Granulocytes 0.07 0.00 - 0.07 K/uL    Comment: Performed at [facility], [address] [place], [place] [address]  Comprehensive metabolic panel     Status: Abnormal   Collection Time: 05/16/20  8:24 AM  Result Value Ref Range   Sodium 138 135 - 145 mmol/L   Potassium 3.9 3.5 - 5.1 mmol/L   Chloride 98 98 - 111 mmol/L   CO2 30 22 - 32 mmol/L   Glucose, Bld 176 (H) 70 - 99 mg/dL    Comment: Glucose reference range applies only to samples taken after fasting for at least 8 hours.   BUN 18 8 - 23 mg/dL   Creatinine, Ser 0.56 (L) 0.61 - 1.24 mg/dL   Calcium 9.0 8.9 - 10.3 mg/dL   Total Protein 6.9 6.5 - 8.1 g/dL   Albumin 3.8 3.5 - 5.0 g/dL   AST 28 15 - 41 U/L   ALT 19 0 - 44 U/L   Alkaline Phosphatase 71 38 - 126 U/L   Total Bilirubin 1.0 0.3 - 1.2 mg/dL   GFR, Estimated >60 >60 mL/min  Anion gap 10 5 - 15    Comment: Performed at [facility], [address] [place], [place] [address]  TSH     Status: None   Collection Time: 06/06/20  8:33 AM  Result Value Ref Range   TSH 3.759 0.350 - 4.500 uIU/mL    Comment: Performed by a 3rd Generation assay with a functional sensitivity of <=0.01 uIU/mL. Performed at [facility], [address] [place], [place] [address]   Comprehensive metabolic panel     Status: None   Collection Time: 06/06/20  8:33 AM  Result Value Ref Range   Sodium 136 135 - 145 mmol/L   Potassium 4.1 3.5 - 5.1 mmol/L   Chloride 98 98 - 111 mmol/L   CO2 29 22 - 32 mmol/L   Glucose, Bld 93 70 - 99 mg/dL    Comment: Glucose reference range applies only to samples taken after fasting for at least 8 hours.   BUN 17 8 - 23 mg/dL   Creatinine, Ser 0.66 0.61 - 1.24 mg/dL   Calcium 9.2 8.9 - 10.3 mg/dL   Total Protein 6.9 6.5 - 8.1 g/dL   Albumin 3.9 3.5 - 5.0 g/dL   AST 26 15 - 41 U/L    ALT 19 0 - 44 U/L   Alkaline Phosphatase 61 38 - 126 U/L   Total Bilirubin 0.9 0.3 - 1.2 mg/dL   GFR, Estimated >60 >60 mL/min    Comment: (NOTE) Calculated using the CKD-EPI Creatinine Equation (2021)    Anion gap 9 5 - 15    Comment: Performed at [facility], [address] [place], [place] [address]  CBC with Differential/Platelet     Status: None   Collection Time: 06/06/20  8:33 AM  Result Value Ref Range   WBC 5.2 4.0 - 10.5 K/uL   RBC 4.34 4.22 - 5.81 MIL/uL   Hemoglobin 14.1 13.0 - 17.0 g/dL   HCT 41.0 39.0 - 52.0 %   MCV 94.5 80.0 - 100.0 fL   MCH 32.5 26.0 - 34.0 pg   MCHC 34.4 30.0 - 36.0 g/dL   RDW 12.7 11.5 - 15.5 %   Platelets 180 150 - 400 K/uL   nRBC 0.0 0.0 - 0.2 %   Neutrophils Relative % 65 %   Neutro Abs 3.4 1.7 - 7.7 K/uL   Lymphocytes Relative 15 %   Lymphs Abs 0.8 0.7 - 4.0 K/uL   Monocytes Relative 11 %   Monocytes Absolute 0.6 0.1 - 1.0 K/uL   Eosinophils Relative 8 %   Eosinophils Absolute 0.4 0.0 - 0.5 K/uL   Basophils Relative 1 %   Basophils Absolute 0.1 0.0 - 0.1 K/uL   Immature Granulocytes 0 %   Abs Immature Granulocytes 0.01 0.00 - 0.07 K/uL    Comment: Performed at [facility], [address] [place], [place] [address]  Comprehensive metabolic panel     Status: Abnormal   Collection Time: 06/18/20  8:26 AM  Result Value Ref Range   Sodium 139 135 - 145 mmol/L   Potassium 4.0 3.5 - 5.1 mmol/L   Chloride 101 98 - 111 mmol/L   CO2 31 22 - 32 mmol/L   Glucose, Bld 96 70 - 99 mg/dL    Comment: Glucose reference range applies only to samples taken after fasting for at least 8 hours.   BUN 14 8 - 23 mg/dL   Creatinine, Ser 0.57 (L) 0.61 - 1.24 mg/dL   Calcium  8.6 (L) 8.9 - 10.3 mg/dL   Total Protein 6.8 6.5 - 8.1 g/dL   Albumin 3.7 3.5 - 5.0 g/dL   AST 24 15 - 41 U/L   ALT 18 0 - 44 U/L   Alkaline Phosphatase 58 38 - 126 U/L   Total Bilirubin 0.7 0.3 - 1.2 mg/dL   GFR, Estimated >60 >60 mL/min    Comment:  (NOTE) Calculated using the CKD-EPI Creatinine Equation (2021)    Anion gap 7 5 - 15    Comment: Performed at [facility], [address] [place], [place] [address]  CBC with Differential/Platelet     Status: None   Collection Time: 06/18/20  8:26 AM  Result Value Ref Range   WBC 4.8 4.0 - 10.5 K/uL   RBC 4.36 4.22 - 5.81 MIL/uL   Hemoglobin 13.8 13.0 - 17.0 g/dL   HCT 41.2 39.0 - 52.0 %   MCV 94.5 80.0 - 100.0 fL   MCH 31.7 26.0 - 34.0 pg   MCHC 33.5 30.0 - 36.0 g/dL   RDW 12.4 11.5 - 15.5 %   Platelets 185 150 - 400 K/uL   nRBC 0.0 0.0 - 0.2 %   Neutrophils Relative % 57 %   Neutro Abs 2.7 1.7 - 7.7 K/uL   Lymphocytes Relative 18 %   Lymphs Abs 0.9 0.7 - 4.0 K/uL   Monocytes Relative 14 %   Monocytes Absolute 0.7 0.1 - 1.0 K/uL   Eosinophils Relative 10 %   Eosinophils Absolute 0.5 0.0 - 0.5 K/uL   Basophils Relative 1 %   Basophils Absolute 0.1 0.0 - 0.1 K/uL   Immature Granulocytes 0 %   Abs Immature Granulocytes 0.01 0.00 - 0.07 K/uL    Comment: Performed at [facility], [address] [place], [place] [address]  Comprehensive metabolic panel     Status: Abnormal   Collection Time: 07/09/20  8:34 AM  Result Value Ref Range   Sodium 138 135 - 145 mmol/L   Potassium 3.9 3.5 - 5.1 mmol/L   Chloride 98 98 - 111 mmol/L   CO2 30 22 - 32 mmol/L   Glucose, Bld 101 (H) 70 - 99 mg/dL    Comment: Glucose reference range applies only to samples taken after fasting for at least 8 hours.   BUN 14 8 - 23 mg/dL   Creatinine, Ser 0.73 0.61 - 1.24 mg/dL   Calcium 9.7 8.9 - 10.3 mg/dL   Total Protein 7.0 6.5 - 8.1 g/dL   Albumin 4.1 3.5 - 5.0 g/dL   AST 26 15 - 41 U/L   ALT 17 0 - 44 U/L   Alkaline Phosphatase 57 38 - 126 U/L   Total Bilirubin 0.6 0.3 - 1.2 mg/dL   GFR, Estimated >60 >60 mL/min    Comment: (NOTE) Calculated using the CKD-EPI Creatinine Equation (2021)    Anion gap 10 5 - 15    Comment: Performed at [facility], [address]  [place], [place] [address]  CBC with Differential/Platelet     Status: None   Collection Time: 07/09/20  8:34 AM  Result Value Ref Range   WBC 4.7 4.0 - 10.5 K/uL   RBC 4.81 4.22 - 5.81 MIL/uL   Hemoglobin 15.3 13.0 - 17.0 g/dL   HCT 45.3 39.0 - 52.0 %   MCV 94.2 80.0 - 100.0 fL   MCH 31.8 26.0 - 34.0 pg   MCHC 33.8 30.0 - 36.0 g/dL   RDW  12.0 11.5 - 15.5 %   Platelets 181 150 - 400 K/uL   nRBC 0.0 0.0 - 0.2 %   Neutrophils Relative % 58 %   Neutro Abs 2.7 1.7 - 7.7 K/uL   Lymphocytes Relative 20 %   Lymphs Abs 1.0 0.7 - 4.0 K/uL   Monocytes Relative 12 %   Monocytes Absolute 0.6 0.1 - 1.0 K/uL   Eosinophils Relative 9 %   Eosinophils Absolute 0.4 0.0 - 0.5 K/uL   Basophils Relative 1 %   Basophils Absolute 0.1 0.0 - 0.1 K/uL   Immature Granulocytes 0 %   Abs Immature Granulocytes 0.01 0.00 - 0.07 K/uL    Comment: Performed at [facility], [address], [place], [place] [address]     Assessment: 65 y.o. male with mild/moderate COVID 19 viral infection diagnosed on 12/31 at high risk for progression to severe COVID 19.  Plan:  This patient is a 65 y.o. male that meets the following criteria for Emergency Use Authorization of: Paxlovid 1. Age >12 yr AND > 40 kg 2. SARS-COV-2 positive test 3. Symptom onset < 5 days 4. Mild-to-moderate COVID disease with high risk for severe progression to hospitalization or death  I have spoken and communicated the following to the patient or parent/caregiver regarding: 1. Paxlovid is an unapproved drug that is authorized for use under an Emergency Use Authorization.  2. There are no adequate, approved, available products for the treatment of COVID-19 in adults who have mild-to-moderate COVID-19 and are at high risk for progressing to severe COVID-19, including hospitalization or death. 3. Other therapeutics are currently authorized. For additional information on all products authorized for treatment or prevention of COVID-19, please see  [email or web address].  4. There are benefits and risks of taking this treatment as outlined in the "Fact Sheet for Patients and Caregivers."  5. "Fact Sheet for Patients and Caregivers" was reviewed with patient. A hard copy will be provided to patient from pharmacy prior to the patient receiving treatment. 6. Patients should continue to self-isolate and use infection control measures (e.g., wear mask, isolate, social distance, avoid sharing personal items, clean and disinfect "high touch" surfaces, and frequent handwashing) according to CDC guidelines.  7. The patient or parent/caregiver has the option to accept or refuse treatment. 8. Patient medication history was reviewed for potential drug interactions:Interaction with home meds: Trelegy- He was asked to hold this medication while taking paxlovid 9. Patient's creatinine clearance was calculated to be 79, and they were therefore prescribed Normal dose (CrCl>60) - nirmatrelvir 150mg  tab (2 tablet) by mouth twice daily AND ritonavir 100mg  tab (1 tablet) by mouth twice daily   After reviewing above information with the patient, the patient agrees to receive Paxlovid.  Follow up instructions:    . Take prescription BID x 5 days as directed . Reach out to pharmacist for counseling on medication if desired . For concerns regarding further COVID symptoms please follow up with your PCP or urgent care . For urgent or life-threatening issues, seek care at your local emergency department  The patient was provided an opportunity to ask questions, and all were answered. The patient agreed with the plan and demonstrated an understanding of the instructions.   Script sent to [place] and opted to pick up RX.  The patient was advised to call their PCP or seek an in-person evaluation if the symptoms worsen or if the condition fails to  improve as anticipated.   I provided 15 minutes of  non face-to-face telephone visit time during this encounter, and > 50% was spent counseling as documented under my assessment & plan.  [name], NP 07/31/2020 /12:47 PM

## 2020-07-31 NOTE — Progress Notes (Signed)
I connected by phone with Cody [name] on 07/31/2020 at 3:13 PM to discuss the potential use of a new treatment for mild to moderate COVID-19 viral infection in non-hospitalized patients.  This patient is a 65 y.o. male that meets the FDA criteria for Emergency Use Authorization of COVID monoclonal antibody casirivimab/imdevimab, bamlanivimab/etesevimab, or sotrovimab.  Has a (+) direct SARS-CoV-2 viral test result  Has mild or moderate COVID-19   Is NOT hospitalized due to COVID-19  Is within 10 days of symptom onset  Has at least one of the high risk factor(s) for progression to severe COVID-19 and/or hospitalization as defined in EUA.  Specific high risk criteria : Older age (>/= 67 yo), Immunosuppressive Disease or Treatment and Chronic Lung Disease   I have spoken and communicated the following to the patient or parent/caregiver regarding COVID monoclonal antibody treatment:  1. FDA has authorized the emergency use for the treatment of mild to moderate COVID-19 in adults and pediatric patients with positive results of direct SARS-CoV-2 viral testing who are 81 years of age and older weighing at least 40 kg, and who are at high risk for progressing to severe COVID-19 and/or hospitalization.  2. The significant known and potential risks and benefits of COVID monoclonal antibody, and the extent to which such potential risks and benefits are unknown.  3. Information on available alternative treatments and the risks and benefits of those alternatives, including clinical trials.  4. Patients treated with COVID monoclonal antibody should continue to self-isolate and use infection control measures (e.g., wear mask, isolate, social distance, avoid sharing personal items, clean and disinfect "high touch" surfaces, and frequent handwashing) according to CDC guidelines.   5. The patient or parent/caregiver has the option to accept or refuse COVID monoclonal antibody treatment.  After reviewing  this information with the patient, the patient has agreed to receive one of the available covid 19 monoclonal antibodies and will be provided an appropriate fact sheet prior to infusion.   [name], NP 07/31/2020 3:13 PM

## 2020-08-01 ENCOUNTER — Encounter (HOSPITAL_COMMUNITY): Payer: Self-pay

## 2020-08-01 ENCOUNTER — Ambulatory Visit (HOSPITAL_COMMUNITY)
Admission: RE | Admit: 2020-08-01 | Discharge: 2020-08-01 | Disposition: A | Discharge status: Home / Self Care | Payer: Medicare HMO | Source: Ambulatory Visit | Attending: Pulmonary Disease | Admitting: Pulmonary Disease

## 2020-08-01 DIAGNOSIS — J9601 Acute respiratory failure with hypoxia: Secondary | ICD-10-CM | POA: Diagnosis not present

## 2020-08-01 DIAGNOSIS — U071 COVID-19: Secondary | ICD-10-CM | POA: Insufficient documentation

## 2020-08-01 DIAGNOSIS — J441 Chronic obstructive pulmonary disease with (acute) exacerbation: Secondary | ICD-10-CM | POA: Diagnosis not present

## 2020-08-01 MED ORDER — METHYLPREDNISOLONE SODIUM SUCC 125 MG IJ SOLR: 125.0000 mg | Freq: Once | INTRAMUSCULAR | Status: DC | PRN

## 2020-08-01 MED ORDER — SOTROVIMAB 500 MG/8ML IV SOLN
500.0000 mg | Freq: Once | INTRAVENOUS | Status: AC
  Administered 2020-08-01: 500 mg via INTRAVENOUS

## 2020-08-01 MED ORDER — ALBUTEROL SULFATE HFA 108 (90 BASE) MCG/ACT IN AERS: 2.0000 | INHALATION_SPRAY | Freq: Once | RESPIRATORY_TRACT | Status: DC | PRN

## 2020-08-01 MED ORDER — FAMOTIDINE IN NACL 20-0.9 MG/50ML-% IV SOLN: 20.0000 mg | Freq: Once | INTRAVENOUS | Status: DC | PRN

## 2020-08-01 MED ORDER — EPINEPHRINE 0.3 MG/0.3ML IJ SOAJ: 0.3000 mg | Freq: Once | INTRAMUSCULAR | Status: DC | PRN

## 2020-08-01 MED ORDER — DIPHENHYDRAMINE HCL 50 MG/ML IJ SOLN: 50.0000 mg | Freq: Once | INTRAMUSCULAR | Status: DC | PRN

## 2020-08-01 MED ORDER — SODIUM CHLORIDE 0.9 % IV SOLN: INTRAVENOUS | Status: DC | PRN

## 2020-08-01 NOTE — Discharge Instructions (Signed)
10 Things You Can Do to Manage Your COVID-19 Symptoms at Home If you have possible or confirmed COVID-19: 1. Stay home from work and school. And stay away from other public places. If you must go out, avoid using any kind of public transportation, ridesharing, or taxis. 2. Monitor your symptoms carefully. If your symptoms get worse, call your healthcare provider immediately. 3. Get rest and stay hydrated. 4. If you have a medical appointment, call the healthcare provider ahead of time and tell them that you have or may have COVID-19. 5. For medical emergencies, call 911 and notify the dispatch personnel that you have or may have COVID-19. 6. Cover your cough and sneezes with a tissue or use the inside of your elbow. 7. Wash your hands often with soap and water for at least 20 seconds or clean your hands with an alcohol-based hand sanitizer that contains at least 60% alcohol. 8. As much as possible, stay in a specific room and away from other people in your home. Also, you should use a separate bathroom, if available. If you need to be around other people in or outside of the home, wear a mask. 9. Avoid sharing personal items with other people in your household, like dishes, towels, and bedding. 10. Clean all surfaces that are touched often, like counters, tabletops, and doorknobs. Use household cleaning sprays or wipes according to the label instructions. cdc.gov/coronavirus 01/26/2019 This information is not intended to replace advice given to you by your health care provider. Make sure you discuss any questions you have with your health care provider. Document Revised: 06/30/2019 Document Reviewed: 06/30/2019 Elsevier Patient Education  2020 [employer] Inc. What types of side effects do monoclonal antibody drugs cause?  Common side effects  In general, the more common side effects caused by monoclonal antibody drugs include: . Allergic reactions, such as hives or itching . Flu-like signs and  symptoms, including chills, fatigue, fever, and muscle aches and pains . Nausea, vomiting . Diarrhea . Skin rashes . Low blood pressure   The CDC is recommending patients who receive monoclonal antibody treatments wait at least 90 days before being vaccinated.  Currently, there are no data on the safety and efficacy of mRNA COVID-19 vaccines in persons who received monoclonal antibodies or convalescent plasma as part of COVID-19 treatment. Based on the estimated half-life of such therapies as well as evidence suggesting that reinfection is uncommon in the 90 days after initial infection, vaccination should be deferred for at least 90 days, as a precautionary measure until additional information becomes available, to avoid interference of the antibody treatment with vaccine-induced immune responses. If you have any questions or concerns after the infusion please call the Advanced Practice Provider on call at [phone]. This number is ONLY intended for your use regarding questions or concerns about the infusion post-treatment side-effects.  Please do not provide this number to others for use. For return to work notes please contact your primary care provider.   If someone you know is interested in receiving treatment please have them call the COVID hotline at [phone].   

## 2020-08-01 NOTE — Progress Notes (Signed)
Patient reviewed Fact Sheet for Patients, Parents, and Caregivers for Emergency Use Authorization (EUA) of sotrovimab for the Treatment of Coronavirus. Patient also reviewed and is agreeable to the estimated cost of treatment. Patient is agreeable to proceed.   

## 2020-08-01 NOTE — Progress Notes (Signed)
Diagnosis: COVID-19  Physician: Dr. Patrick Wright  Procedure: Covid Infusion Clinic Med: Sotrovimab infusion - Provided patient with sotrovimab fact sheet for patients, parents, and caregivers prior to infusion.   Complications: No immediate complications noted  Discharge: Discharged home    

## 2020-08-03 ENCOUNTER — Ambulatory Visit: Payer: Medicare HMO | Admitting: Oncology

## 2020-08-03 ENCOUNTER — Other Ambulatory Visit: Payer: Medicare HMO

## 2020-08-03 ENCOUNTER — Ambulatory Visit: Payer: Medicare HMO

## 2020-08-06 ENCOUNTER — Encounter: Payer: Self-pay | Admitting: *Deleted

## 2020-08-15 ENCOUNTER — Ambulatory Visit
Admission: RE | Admit: 2020-08-15 | Discharge: 2020-08-15 | Disposition: A | Discharge status: Home / Self Care | Payer: Medicare HMO | Source: Ambulatory Visit | Attending: Internal Medicine | Admitting: Internal Medicine

## 2020-08-15 ENCOUNTER — Other Ambulatory Visit: Payer: Self-pay

## 2020-08-15 DIAGNOSIS — C3490 Malignant neoplasm of unspecified part of unspecified bronchus or lung: Secondary | ICD-10-CM | POA: Diagnosis not present

## 2020-08-15 DIAGNOSIS — I700 Atherosclerosis of aorta: Secondary | ICD-10-CM | POA: Diagnosis not present

## 2020-08-15 DIAGNOSIS — S2241XA Multiple fractures of ribs, right side, initial encounter for closed fracture: Secondary | ICD-10-CM | POA: Diagnosis not present

## 2020-08-15 DIAGNOSIS — K7689 Other specified diseases of liver: Secondary | ICD-10-CM | POA: Diagnosis not present

## 2020-08-15 DIAGNOSIS — C3411 Malignant neoplasm of upper lobe, right bronchus or lung: Secondary | ICD-10-CM | POA: Insufficient documentation

## 2020-08-15 DIAGNOSIS — J90 Pleural effusion, not elsewhere classified: Secondary | ICD-10-CM | POA: Diagnosis not present

## 2020-08-15 DIAGNOSIS — S22049A Unspecified fracture of fourth thoracic vertebra, initial encounter for closed fracture: Secondary | ICD-10-CM | POA: Diagnosis not present

## 2020-08-15 DIAGNOSIS — J984 Other disorders of lung: Secondary | ICD-10-CM | POA: Diagnosis not present

## 2020-08-15 LAB — POCT I-STAT CREATININE: Creatinine, Ser: 0.7 mg/dL (ref 0.61–1.24)

## 2020-08-15 MED ORDER — IOHEXOL 300 MG/ML  SOLN
100.0000 mL | Freq: Once | INTRAMUSCULAR | Status: AC | PRN
  Administered 2020-08-15: 100 mL via INTRAVENOUS

## 2020-08-17 ENCOUNTER — Encounter: Payer: Self-pay | Admitting: Internal Medicine

## 2020-08-20 ENCOUNTER — Inpatient Hospital Stay: Payer: Medicare HMO

## 2020-08-20 ENCOUNTER — Inpatient Hospital Stay: Payer: Medicare HMO | Attending: Internal Medicine | Admitting: Internal Medicine

## 2020-08-20 ENCOUNTER — Encounter: Payer: Self-pay | Admitting: Internal Medicine

## 2020-08-20 DIAGNOSIS — Z8249 Family history of ischemic heart disease and other diseases of the circulatory system: Secondary | ICD-10-CM | POA: Insufficient documentation

## 2020-08-20 DIAGNOSIS — J45909 Unspecified asthma, uncomplicated: Secondary | ICD-10-CM | POA: Insufficient documentation

## 2020-08-20 DIAGNOSIS — R64 Cachexia: Secondary | ICD-10-CM | POA: Insufficient documentation

## 2020-08-20 DIAGNOSIS — C7951 Secondary malignant neoplasm of bone: Secondary | ICD-10-CM | POA: Insufficient documentation

## 2020-08-20 DIAGNOSIS — C3411 Malignant neoplasm of upper lobe, right bronchus or lung: Secondary | ICD-10-CM | POA: Insufficient documentation

## 2020-08-20 DIAGNOSIS — M4854XA Collapsed vertebra, not elsewhere classified, thoracic region, initial encounter for fracture: Secondary | ICD-10-CM | POA: Insufficient documentation

## 2020-08-20 DIAGNOSIS — Z5112 Encounter for antineoplastic immunotherapy: Secondary | ICD-10-CM | POA: Diagnosis not present

## 2020-08-20 DIAGNOSIS — Z801 Family history of malignant neoplasm of trachea, bronchus and lung: Secondary | ICD-10-CM | POA: Diagnosis not present

## 2020-08-20 DIAGNOSIS — R5381 Other malaise: Secondary | ICD-10-CM | POA: Diagnosis not present

## 2020-08-20 DIAGNOSIS — E46 Unspecified protein-calorie malnutrition: Secondary | ICD-10-CM | POA: Diagnosis not present

## 2020-08-20 DIAGNOSIS — Z7289 Other problems related to lifestyle: Secondary | ICD-10-CM | POA: Insufficient documentation

## 2020-08-20 DIAGNOSIS — I700 Atherosclerosis of aorta: Secondary | ICD-10-CM | POA: Diagnosis not present

## 2020-08-20 DIAGNOSIS — J439 Emphysema, unspecified: Secondary | ICD-10-CM | POA: Diagnosis not present

## 2020-08-20 DIAGNOSIS — N400 Enlarged prostate without lower urinary tract symptoms: Secondary | ICD-10-CM | POA: Insufficient documentation

## 2020-08-20 DIAGNOSIS — Z87891 Personal history of nicotine dependence: Secondary | ICD-10-CM | POA: Insufficient documentation

## 2020-08-20 DIAGNOSIS — R5383 Other fatigue: Secondary | ICD-10-CM | POA: Diagnosis not present

## 2020-08-20 DIAGNOSIS — Z7189 Other specified counseling: Secondary | ICD-10-CM

## 2020-08-20 DIAGNOSIS — R0602 Shortness of breath: Secondary | ICD-10-CM | POA: Diagnosis not present

## 2020-08-20 DIAGNOSIS — G4700 Insomnia, unspecified: Secondary | ICD-10-CM | POA: Diagnosis not present

## 2020-08-20 DIAGNOSIS — Z79899 Other long term (current) drug therapy: Secondary | ICD-10-CM | POA: Diagnosis not present

## 2020-08-20 DIAGNOSIS — C3410 Malignant neoplasm of upper lobe, unspecified bronchus or lung: Secondary | ICD-10-CM

## 2020-08-20 LAB — CBC WITH DIFFERENTIAL/PLATELET
Abs Immature Granulocytes: 0.01 10*3/uL (ref 0.00–0.07)
Basophils Absolute: 0.1 10*3/uL (ref 0.0–0.1)
Basophils Relative: 1 %
Eosinophils Absolute: 0.5 10*3/uL (ref 0.0–0.5)
Eosinophils Relative: 11 %
HCT: 42.4 % (ref 39.0–52.0)
Hemoglobin: 14.4 g/dL (ref 13.0–17.0)
Immature Granulocytes: 0 %
Lymphocytes Relative: 19 %
Lymphs Abs: 0.9 10*3/uL (ref 0.7–4.0)
MCH: 31.4 pg (ref 26.0–34.0)
MCHC: 34 g/dL (ref 30.0–36.0)
MCV: 92.4 fL (ref 80.0–100.0)
Monocytes Absolute: 0.7 10*3/uL (ref 0.1–1.0)
Monocytes Relative: 14 %
Neutro Abs: 2.6 10*3/uL (ref 1.7–7.7)
Neutrophils Relative %: 55 %
Platelets: 161 10*3/uL (ref 150–400)
RBC: 4.59 MIL/uL (ref 4.22–5.81)
RDW: 11.9 % (ref 11.5–15.5)
WBC: 4.8 10*3/uL (ref 4.0–10.5)
nRBC: 0 % (ref 0.0–0.2)

## 2020-08-20 LAB — COMPREHENSIVE METABOLIC PANEL
ALT: 16 U/L (ref 0–44)
AST: 20 U/L (ref 15–41)
Albumin: 3.7 g/dL (ref 3.5–5.0)
Alkaline Phosphatase: 58 U/L (ref 38–126)
Anion gap: 8 (ref 5–15)
BUN: 10 mg/dL (ref 8–23)
CO2: 29 mmol/L (ref 22–32)
Calcium: 8.5 mg/dL — ABNORMAL LOW (ref 8.9–10.3)
Chloride: 97 mmol/L — ABNORMAL LOW (ref 98–111)
Creatinine, Ser: 0.58 mg/dL — ABNORMAL LOW (ref 0.61–1.24)
GFR, Estimated: >60 mL/min (ref >60)
Glucose, Bld: 95 mg/dL (ref 70–99)
Potassium: 4 mmol/L (ref 3.5–5.1)
Sodium: 134 mmol/L — ABNORMAL LOW (ref 135–145)
Total Bilirubin: 0.4 mg/dL (ref 0.3–1.2)
Total Protein: 6.6 g/dL (ref 6.5–8.1)

## 2020-08-20 MED ORDER — SODIUM CHLORIDE 0.9 % IV SOLN
Freq: Once | INTRAVENOUS | Status: AC
  Filled 2020-08-20: qty 250

## 2020-08-20 MED ORDER — SODIUM CHLORIDE 0.9 % IV SOLN
200.0000 mg | Freq: Once | INTRAVENOUS | Status: AC
  Administered 2020-08-20: 200 mg via INTRAVENOUS
  Filled 2020-08-20: qty 8

## 2020-08-20 MED ORDER — HEPARIN SOD (PORK) LOCK FLUSH 100 UNIT/ML IV SOLN
500.0000 [IU] | Freq: Once | INTRAVENOUS | Status: AC | PRN
  Administered 2020-08-20: 500 [IU]
  Filled 2020-08-20: qty 5

## 2020-08-20 MED ORDER — SODIUM CHLORIDE 0.9% FLUSH
10.0000 mL | Freq: Once | INTRAVENOUS | Status: AC
  Administered 2020-08-20: 10 mL via INTRAVENOUS
  Filled 2020-08-20: qty 10

## 2020-08-20 NOTE — Progress Notes (Signed)
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   # MAY 26th-Keytrda x2 cycles; [borderline PS] July 7th-carbo Alimta Keytruda cycle #1  # NGS/MOLECULAR TESTS:   # PALLIATIVE CARE EVALUATION:  # PAIN MANAGEMENT:    DIAGNOSIS:   STAGE:         ;  GOALS:  CURRENT/MOST RECENT THERAPY :     Cancer of upper lobe of right lung ([place])  11/23/2019 Initial Diagnosis   Cancer of upper lobe of right lung ([place])   12/21/2019 -  Chemotherapy    Patient is on Treatment Plan: LUNG CARBOPLATIN / PEMETREXED / PEMBROLIZUMAB Q21D INDUCTION X 4 CYCLES / MAINTENANCE PEMETREXED + PEMBROLIZUMAB       HISTORY OF PRESENTING ILLNESS:  Cody [name] 65 y.o.  male patient with metastatic non-small cell lung cancer; cord compression status post decompressive surgery; currently on Keytruda-is here for follow-up/ review results of CT scan   Patient's Keytruda was held 3 weeks ago because of recent Covid infection.  Patient underwent monoclonal antibody therapy.  Patient did not have to be hospitalized.  Patient was vaccinated.  Patient denies any diarrhea.  No nausea vomiting.  He is able to walk small distances at home without any assistive devices.  He continues to be on 2 L nasal cannula oxygen.  . Review of Systems  Constitutional: Positive for malaise/fatigue. Negative for  chills, diaphoresis and fever.  HENT: Negative for nosebleeds and sore throat.   Eyes: Negative for double vision.  Respiratory: Positive for shortness of breath. Negative for cough, hemoptysis, sputum production and wheezing.   Cardiovascular: Negative for chest pain, palpitations, orthopnea and leg swelling.  Gastrointestinal: Negative for abdominal pain, blood in stool, constipation, heartburn, melena, nausea and vomiting.  Genitourinary: Negative for dysuria, frequency and urgency.  Musculoskeletal: Positive for back pain and joint pain.  Skin: Negative.  Negative for itching and rash.  Neurological: Negative for dizziness, tingling, focal weakness, weakness and headaches.  Endo/Heme/Allergies: Does not bruise/bleed easily.  Psychiatric/Behavioral: Negative for depression. The patient is not nervous/anxious and does not have insomnia.      MEDICAL HISTORY:  Past Medical History:  Diagnosis Date  . Allergy   . Cancer of upper lobe of right lung ([place]) 10/2019  . COPD (chronic obstructive pulmonary disease) ([place])   . Prostate enlargement     SURGICAL HISTORY: Past Surgical History:  Procedure Laterality Date  . HERNIA REPAIR Bilateral 1982  . HERNIA REPAIR Right 1994  . IR IMAGING GUIDED PORT INSERTION  02/24/2020  . LAMINECTOMY FOR EXCISION / EVACUATION INTRASPINAL LESION  11/07/2019   T1-T 6 at [facility]     SOCIAL HISTORY: Social History   Socioeconomic History  . Marital status: Married    Spouse name: [name]   . Number of children: 4  . Years of education: Not on file  . Highest education level: Some college, no degree  Occupational History  . Occupation: dispatch and delivery  Tobacco Use  . Smoking status: Former Smoker    Packs/day: 2.00    Years: 30.00    Pack years: 60.00    Quit date: 07/29/2011    Years since quitting: 9.0  . Smokeless tobacco: Never Used  . Tobacco comment: pt vapes  Vaping Use  . Vaping Use: Not on file  Substance and Sexual Activity  .  Alcohol use: Yes    Alcohol/week: 5.0 standard drinks    Types: 5 Standard drinks or equivalent per week    Comment: 1-2/day  . Drug use: No  . Sexual activity: Not Currently    Partners: Female  Other Topics Concern  . Not on file  Social History Narrative   Worked in warehouse/ quit smoking in 2015; beer 1/day; in [place]; with wife.    Social Determinants of Health   Financial Resource Strain: Low Risk   . Difficulty of Paying Living Expenses: Not hard at all  Food Insecurity: No Food Insecurity  . Worried About Charity fundraiser in the Last Year: Never true  . Ran Out of Food in the Last Year: Never true  Transportation Needs: No Transportation Needs  . Lack of Transportation (Medical): No  . Lack of Transportation (Non-Medical): No  Physical Activity: Insufficiently Active  . Days of Exercise per Week: 7 days  . Minutes of Exercise per Session: 20 min  Stress: No Stress Concern Present  . Feeling of Stress : Not at all  Social Connections: Socially Integrated  . Frequency of Communication with Friends and Family: More than three times a week  . Frequency of Social Gatherings with Friends and Family: More than three times a week  . Attends Religious Services: More than 4 times per year  . Active Member of Clubs or Organizations: Yes  . Attends Archivist Meetings: More than 4 times per year  . Marital Status: Married  Human resources officer Violence: Not At Risk  . Fear of Current or Ex-Partner: No  . Emotionally Abused: No  . Physically Abused: No  . Sexually Abused: No    FAMILY HISTORY: Family History  Problem Relation Age of Onset  . Heart disease Father   . CVA Mother   . Lung cancer Sister   . Lung cancer Brother     ALLERGIES:  has No Known Allergies.  MEDICATIONS:  Current Outpatient Medications  Medication Sig Dispense Refill  . acetaminophen (TYLENOL) 500 MG tablet Take 500-1,000 mg by mouth every 6 (six) hours as needed for mild pain or  fever.     Marland Kitchen albuterol (VENTOLIN HFA) 108 (90 Base) MCG/ACT inhaler Inhale 2 puffs into the lungs every 4 (four) hours as needed for wheezing or shortness of breath. 18 g 0  . Calcium Carb-Cholecalciferol (OYSTER SHELL CALCIUM) 500-400 MG-UNIT TABS Take 500 tablets by mouth.    . escitalopram (LEXAPRO) 5 MG tablet Take 1 tablet (5 mg total) by mouth daily. am's 90 tablet 1  . Fluticasone-Umeclidin-Vilant (TRELEGY ELLIPTA) 100-62.5-25 MCG/INH AEPB Inhale 1 puff into the lungs daily. 180 each 1  . lidocaine-prilocaine (EMLA) cream Apply 1 application topically as needed. Apply small amount to port site at least 1 hour prior to it being accessed, cover with plastic wrap 30 g 1  . Multiple Vitamins-Minerals (MULTIVITAMIN GUMMIES MENS PO) Take 1 Dose by mouth daily.     . ondansetron (ZOFRAN) 4 MG tablet Take 1 tablet (4 mg total) by mouth every 8 (eight) hours as needed for nausea or  vomiting. 20 tablet 2  . prochlorperazine (COMPAZINE) 10 MG tablet Take 1 tablet (10 mg total) by mouth every 6 (six) hours as needed for nausea or vomiting. 40 tablet 1  . QUEtiapine (SEROQUEL) 25 MG tablet Take 1 tablet (25 mg total) by mouth at bedtime. 90 tablet 1   No current facility-administered medications for this visit.      Marland Kitchen  PHYSICAL EXAMINATION: ECOG PERFORMANCE STATUS: 3 - Symptomatic, >50% confined to bed  Vitals:   08/20/20 0933  BP: 113/76  Pulse: 68  Resp: 16  Temp: (!) 96.8 F (36 C)  SpO2: 100%   Filed Weights   08/20/20 0933  Weight: 121 lb (54.9 kg)    Physical Exam Constitutional:      Comments: Thin built frail appearing male patient.  No acute distress.  HEENT negative.  Accompanied by his daughter  HENT:     Head: Normocephalic and atraumatic.     Mouth/Throat:     Pharynx: No oropharyngeal exudate.  Eyes:     Pupils: Pupils are equal, round, and reactive to light.  Cardiovascular:     Rate and Rhythm: Normal rate and regular rhythm.  Pulmonary:     Effort: No  respiratory distress.     Breath sounds: No wheezing.     Comments: Decreased air entry bilaterally. Abdominal:     General: Bowel sounds are normal. There is no distension.     Palpations: Abdomen is soft. There is no mass.     Tenderness: There is no abdominal tenderness. There is no guarding or rebound.  Musculoskeletal:        General: No tenderness. Normal range of motion.     Cervical back: Normal range of motion and neck supple.  Skin:    General: Skin is warm.  Neurological:     Mental Status: He is alert and oriented to person, place, and time.  Psychiatric:        Mood and Affect: Affect normal.      LABORATORY DATA:  I have reviewed the data as listed Lab Results  Component Value Date   WBC 4.8 08/20/2020   HGB 14.4 08/20/2020   HCT 42.4 08/20/2020   MCV 92.4 08/20/2020   PLT 161 08/20/2020   Recent Labs    03/14/20 0831 04/04/20 0827 04/25/20 0834 05/16/20 0824 06/18/20 0826 07/09/20 0834 08/15/20 1014 08/20/20 0900  NA 139 137 139   < > 139 138  --  134*  K 3.9 4.3 4.2   < > 4.0 3.9  --  4.0  CL 103 99 102   < > 101 98  --  97*  CO2 29 28 28    < > 31 30  --  29  GLUCOSE 104* 181* 102*   < > 96 101*  --  95  BUN 19 19 15    < > 14 14  --  10  CREATININE 0.56* 0.61 0.60*   < > 0.57* 0.73 0.70 0.58*  CALCIUM 8.8* 8.8* 8.8*   < > 8.6* 9.7  --  8.5*  GFRNONAA >60 >60 >60   < > >60 >60  --  >60  GFRAA >60 >60 >60  --   --   --   --   --   PROT 6.9 6.7 6.4*   < > 6.8 7.0  --  6.6  ALBUMIN 3.8 3.7 3.7   < > 3.7 4.1  --  3.7  AST 24 29 23    < >  24 26  --  20  ALT 20 20 19    < > 18 17  --  16  ALKPHOS 82 77 66   < > 58 57  --  58  BILITOT 0.5 0.6 0.6   < > 0.7 0.6  --  0.4   < > = values in this interval not displayed.    RADIOGRAPHIC STUDIES: I have personally reviewed the radiological images as listed and agreed with the findings in the report. CT CHEST ABDOMEN PELVIS W CONTRAST  Result Date: 08/15/2020 CLINICAL DATA:  Non-small cell lung cancer  assess treatment response. EXAM: CT CHEST, ABDOMEN, AND PELVIS WITH CONTRAST TECHNIQUE: Multidetector CT imaging of the chest, abdomen and pelvis was performed following the standard protocol during bolus administration of intravenous contrast. CONTRAST:  149m OMNIPAQUE IOHEXOL 300 MG/ML  SOLN COMPARISON:  April 30, 2020 FINDINGS: CT CHEST FINDINGS Cardiovascular: RIGHT-sided Port-A-Cath terminates at the caval to atrial junction. Aortic caliber is normal. Calcified and noncalcified atheromatous plaque in the thoracic aorta as before. Heart size normal without pericardial effusion. Central pulmonary vasculature is normal caliber. Mediastinum/Nodes: No adenopathy in the chest. Esophagus grossly normal. Thoracic inlet structures unremarkable. Lungs/Pleura: Pulmonary emphysema with pleural and parenchymal scarring at the RIGHT lung apex with similar appearance. Nodular area along the anterior RIGHT chest (image 32, series 4) this measures 1.8 x 1.7 cm and is unchanged. Other areas of pleural thickening and bandlike scarring are similar as well. Small intrapulmonary lymph node along the minor fissure in the RIGHT chest is stable. No consolidation. No pleural effusion. No new pulmonary nodule. Musculoskeletal: See below for full musculoskeletal detail. Thoracic spinal fusion and laminectomy partially imaged. Signs of bony destruction in the thoracic spine with similar appearance. CT ABDOMEN PELVIS FINDINGS Hepatobiliary: Areas of low attenuation in the liver compatible with metastatic lesions. (Image 60, series 2) 1.4 x 1.2 cm lesion previously approximately 1.4 x 1.4 cm. This is in the RIGHT hepatic lobe, hepatic subsegment VIII Lesion adjacent to RIGHT portal pedicle (image 68, series 2) 1.4 x 1.8 cm previously approximately 1.8 x 1.9 cm. Adjacent smaller lesion and associated biliary duct distension without change. Small lesion in the periphery of the RIGHT hepatic lobe (image 61, series 2) unchanged approximately 1  cm. No new lesion in the liver. The portal vein is patent. Pancreas: Normal, without mass, inflammation or ductal dilatation. Spleen: Spleen is normal. Adrenals/Urinary Tract: Adrenal glands are normal. Symmetric renal enhancement. No hydronephrosis. Stomach/Bowel: No acute gastrointestinal process. Vascular/Lymphatic: Calcified and noncalcified atheromatous plaque in the abdominal aorta. No aneurysmal dilation. There is no gastrohepatic or hepatoduodenal ligament lymphadenopathy. No retroperitoneal or mesenteric lymphadenopathy. No pelvic sidewall lymphadenopathy. Reproductive: Prostate heterogeneous, nonspecific finding on CT. Other: No ascites. Musculoskeletal: Soft tissue component of paravertebral mass is not well-defined, adjacent to T1 through T6 fusion, essentially not measurable due to streak artifact from adjacent fusion hardware and unchanged from the study of October of 2021 fusion hardware expansions from T1 through T6 as before. Also with unchanged appearance of irregularity of posterior ribs 4 and 5 as well as 6 the RIGHT and associated fractures of T3 and T4. No new where she destructive bone lesion. Ovoid low-density area along the lower margin of RIGHT inguinal herniorrhaphy changes anterior to the inguinal canal unchanged approximately 1.8 x 1.5 cm IMPRESSION: 1. Stable nodular area amidst pleural and parenchymal scarring in the RIGHT upper lobe. 2. Potential slight decrease in size of hepatic metastatic lesions. 3. Signs of bony involvement in ribs  with spine and compression T3-T4 compression fractures spanned by T1-T6 spinal fusion and laminectomy with similar appearance. Associated soft tissue mass essentially not measurable on today's study grossly unchanged. 4. No new signs of disease. 5. Ovoid low-density area along the lower margin of RIGHT inguinal herniorrhaphy changes anterior to the inguinal canal unchanged, significance uncertain. Likely related to prior herniorrhaphy. Could consider  focused ultrasound for further evaluation as warranted, potentially a seroma or even sebaceous cyst. 6. Aortic atherosclerosis. Aortic Atherosclerosis (ICD10-I70.0) and Emphysema (ICD10-J43.9). Electronically Signed   By: Zetta Bills M.D.   On: 08/15/2020 11:01    ASSESSMENT & PLAN:   Cancer of upper lobe of right lung ([place]) # Stage IV metastatic non-small cell lung cancer favor adeno; mets to bone; liver.  Currently on Keytruda maintenance; JAN 19th, 2022-stable right upper lobe lesion; slight decrease in the hepatic metastatic disease; stable T3-T4 compression fractures/T1 T6 spinal fusion.  # Proceed with [place]. Labs today reviewed;  acceptable for treatment today.   # Bone metastases- on zometa 3 mg IVPB.  Every 6 weeks. Ca-8.5 on  ca+ vit D BID; HOLD Zometa today. .  #Malnutrition/cachexia-continue weight loss/underlying malignancy.  S/p nutrition evaluation-STABLE   # COPD- STABLE; Trelegy.  # Insomnia-continue Seroquel.STABLE  #Debility-spinal cord compression/status post surgery-on physical therapy--STABLE. .  # DISPOSITION: # Keytruda; HOLD ZOMETA # follow up in 3 weeks - MD;labs- cbc/cmp; Keytruda;Zometa; TSH- Dr.B  # I reviewed the blood work- with the patient in detail; also reviewed the imaging independently [as summarized above]; and with the patient in detail.  '     All questions were answered. The patient knows to call the clinic with any problems, questions or concerns.    Cammie Sickle, MD 08/20/2020 3:01 PM

## 2020-08-20 NOTE — Progress Notes (Signed)
Pt tolerated treatment well with no signs of complications. VSS. Pt stable for discharge.   Kymberlyn Eckford [employer]

## 2020-08-20 NOTE — Progress Notes (Signed)
Per Dr. Rogue Bussing okay to to proceed with Keytruda, hold Zometa at this time.

## 2020-08-20 NOTE — Assessment & Plan Note (Addendum)
#   Stage IV metastatic non-small cell lung cancer favor adeno; mets to bone; liver.  Currently on Keytruda maintenance; JAN 19th, 2022-stable right upper lobe lesion; slight decrease in the hepatic metastatic disease; stable T3-T4 compression fractures/T1 T6 spinal fusion.  # Proceed with [place]. Labs today reviewed;  acceptable for treatment today.   # Bone metastases- on zometa 3 mg IVPB.  Every 6 weeks. Ca-8.5 on  ca+ vit D BID; HOLD Zometa today. .  #Malnutrition/cachexia-continue weight loss/underlying malignancy.  S/p nutrition evaluation-STABLE   # COPD- STABLE; Trelegy.  # Insomnia-continue Seroquel.STABLE  #Debility-spinal cord compression/status post surgery-on physical therapy--STABLE. .  # DISPOSITION: # Keytruda; HOLD ZOMETA # follow up in 3 weeks - MD;labs- cbc/cmp; Keytruda;Zometa; TSH- Dr.B  # I reviewed the blood work- with the patient in detail; also reviewed the imaging independently [as summarized above]; and with the patient in detail.  '

## 2020-08-24 NOTE — Progress Notes (Signed)
Name: Cody [name]   MRN: [id number]    DOB: 1955-08-11   Date:08/27/2020       Progress Note  Subjective  Chief Complaint  Right shoulder steroid injection  HPI   Chronic right shoulder pain: going on for a long time, but getting worse and now he has difficulty reaching behind his back, pain with abduction and his right hand is getting weak. On his last visit we discussed PT, steroid injection or referral to surgeon . He returned today to get the steroid injection   Lung cancer and deconditioning: doing well, he walked here today, using oxygen and pulse ox is above 98 %   Cerumen: he asked if he needed ear lavage, mild wax on left ear canal, discussed debrox otc prn , no need for ear lavage today   Patient Active Problem List   Diagnosis Date Noted  . Senile purpura ([place]) 04/10/2020  . S/P spinal fusion 12/23/2019  . Goals of care, counseling/discussion 12/11/2019  . Hypoxia 11/30/2019  . 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 COPD with acute exacerbation ([place]) 11/29/2019  . Cancer of upper lobe of right lung ([place]) 11/23/2019  . Metastatic cancer to spine ([place]) 11/01/2019  . BPH with elevated PSA 07/16/2015  . COPD bronchitis 07/16/2015  . Seasonal and perennial allergic rhinitis 04/24/2010    Past Surgical History:  Procedure Laterality Date  . HERNIA REPAIR Bilateral 1982  . HERNIA REPAIR Right 1994  . IR IMAGING GUIDED PORT INSERTION  02/24/2020  . LAMINECTOMY FOR EXCISION / EVACUATION INTRASPINAL LESION  11/07/2019   T1-T 6 at [facility]     Family History  Problem Relation Age of Onset  . Heart disease Father   . CVA Mother   . Lung cancer Sister   . Lung cancer Brother     Social History   Tobacco Use  . Smoking status: Former Smoker    Packs/day: 2.00    Years: 30.00    Pack years: 60.00    Quit date: 07/29/2011    Years since quitting: 9.0  . Smokeless tobacco: Never Used  . Tobacco comment: pt vapes  Substance Use Topics  .  Alcohol use: Yes    Alcohol/week: 5.0 standard drinks    Types: 5 Standard drinks or equivalent per week    Comment: 1-2/day     Current Outpatient Medications:  .  acetaminophen (TYLENOL) 500 MG tablet, Take 500-1,000 mg by mouth every 6 (six) hours as needed for mild pain or fever. , Disp: , Rfl:  .  albuterol (VENTOLIN HFA) 108 (90 Base) MCG/ACT inhaler, Inhale 2 puffs into the lungs every 4 (four) hours as needed for wheezing or shortness of breath., Disp: 18 g, Rfl: 0 .  Calcium Carb-Cholecalciferol (OYSTER SHELL CALCIUM) 500-400 MG-UNIT TABS, Take 500 tablets by mouth., Disp: , Rfl:  .  escitalopram (LEXAPRO) 5 MG tablet, Take 1 tablet (5 mg total) by mouth daily. am's, Disp: 90 tablet, Rfl: 1 .  Fluticasone-Umeclidin-Vilant (TRELEGY ELLIPTA) 100-62.5-25 MCG/INH AEPB, Inhale 1 puff into the lungs daily., Disp: 180 each, Rfl: 1 .  lidocaine-prilocaine (EMLA) cream, Apply 1 application topically as needed. Apply small amount to port site at least 1 hour prior to it being accessed, cover with plastic wrap, Disp: 30 g, Rfl: 1 .  Multiple Vitamins-Minerals (MULTIVITAMIN GUMMIES MENS PO), Take 1 Dose by mouth daily. , Disp: , Rfl:  .  PAXLOVID 20 x 150 MG & 10 x 100MG   TBPK, , Disp: , Rfl:  .  QUEtiapine (SEROQUEL) 25 MG tablet, Take 1 tablet (25 mg total) by mouth at bedtime., Disp: 90 tablet, Rfl: 1 .  ondansetron (ZOFRAN) 4 MG tablet, Take 1 tablet (4 mg total) by mouth every 8 (eight) hours as needed for nausea or vomiting. (Patient not taking: Reported on 08/27/2020), Disp: 20 tablet, Rfl: 2 .  prochlorperazine (COMPAZINE) 10 MG tablet, Take 1 tablet (10 mg total) by mouth every 6 (six) hours as needed for nausea or vomiting. (Patient not taking: Reported on 08/27/2020), Disp: 40 tablet, Rfl: 1  No Known Allergies  I personally reviewed active problem list, medication list, allergies, family history, social history, health maintenance with the patient/caregiver today.   ROS  Ten systems  reviewed and is negative except as mentioned in HPI  He still has pain , able to walk slowly without walker, denies cough, on oxygen for SOB  Objective  Vitals:   08/27/20 0947  BP: 120/72  Pulse: 90  Resp: 16  Temp: 97.9 F (36.6 C)  TempSrc: Oral  SpO2: 98%  Weight: 121 lb 1.6 oz (54.9 kg)  Height: 5\' 9"  (1.753 m)    Body mass index is 17.88 kg/m.  Physical Exam  Constitutional: Patient appears cachetic  HEENT: head atraumatic, normocephalic, pupils equal and reactive to light, ears : mild amount of cerumen on left ear canal , neck supple Cardiovascular: Normal rate, regular rhythm and normal heart sounds.  No murmur heard. No BLE edema. Pulmonary/Chest: Effort normal and breath sounds normal. No respiratory distress. Abdominal: Soft.  There is no tenderness. Psychiatric: Patient has a normal mood and affect. behavior is normal. Judgment and thought content normal. Muscular skeletal: decrease rom of right shoulder, pain with abduction, positive empty can sign  Recent Results (from the past 2160 hour(s))  TSH     Status: None   Collection Time: 06/06/20  8:33 AM  Result Value Ref Range   TSH 3.759 0.350 - 4.500 uIU/mL    Comment: Performed by a 3rd Generation assay with a functional sensitivity of <=0.01 uIU/mL. Performed at [facility], [address] [place], [place] [address]   Comprehensive metabolic panel     Status: None   Collection Time: 06/06/20  8:33 AM  Result Value Ref Range   Sodium 136 135 - 145 mmol/L   Potassium 4.1 3.5 - 5.1 mmol/L   Chloride 98 98 - 111 mmol/L   CO2 29 22 - 32 mmol/L   Glucose, Bld 93 70 - 99 mg/dL    Comment: Glucose reference range applies only to samples taken after fasting for at least 8 hours.   BUN 17 8 - 23 mg/dL   Creatinine, Ser 0.66 0.61 - 1.24 mg/dL   Calcium 9.2 8.9 - 10.3 mg/dL   Total Protein 6.9 6.5 - 8.1 g/dL   Albumin 3.9 3.5 - 5.0 g/dL   AST 26 15 - 41 U/L   ALT 19 0 - 44 U/L   Alkaline  Phosphatase 61 38 - 126 U/L   Total Bilirubin 0.9 0.3 - 1.2 mg/dL   GFR, Estimated >60 >60 mL/min    Comment: (NOTE) Calculated using the CKD-EPI Creatinine Equation (2021)    Anion gap 9 5 - 15    Comment: Performed at [facility], [address], [place],  [address]  CBC with Differential/Platelet     Status: None   Collection Time: 06/06/20  8:33 AM  Result Value Ref Range  WBC 5.2 4.0 - 10.5 K/uL   RBC 4.34 4.22 - 5.81 MIL/uL   Hemoglobin 14.1 13.0 - 17.0 g/dL   HCT 41.0 39.0 - 52.0 %   MCV 94.5 80.0 - 100.0 fL   MCH 32.5 26.0 - 34.0 pg   MCHC 34.4 30.0 - 36.0 g/dL   RDW 12.7 11.5 - 15.5 %   Platelets 180 150 - 400 K/uL   nRBC 0.0 0.0 - 0.2 %   Neutrophils Relative % 65 %   Neutro Abs 3.4 1.7 - 7.7 K/uL   Lymphocytes Relative 15 %   Lymphs Abs 0.8 0.7 - 4.0 K/uL   Monocytes Relative 11 %   Monocytes Absolute 0.6 0.1 - 1.0 K/uL   Eosinophils Relative 8 %   Eosinophils Absolute 0.4 0.0 - 0.5 K/uL   Basophils Relative 1 %   Basophils Absolute 0.1 0.0 - 0.1 K/uL   Immature Granulocytes 0 %   Abs Immature Granulocytes 0.01 0.00 - 0.07 K/uL    Comment: Performed at [facility], [address] [place], [place] [address]  Comprehensive metabolic panel     Status: Abnormal   Collection Time: 06/18/20  8:26 AM  Result Value Ref Range   Sodium 139 135 - 145 mmol/L   Potassium 4.0 3.5 - 5.1 mmol/L   Chloride 101 98 - 111 mmol/L   CO2 31 22 - 32 mmol/L   Glucose, Bld 96 70 - 99 mg/dL    Comment: Glucose reference range applies only to samples taken after fasting for at least 8 hours.   BUN 14 8 - 23 mg/dL   Creatinine, Ser 0.57 (L) 0.61 - 1.24 mg/dL   Calcium 8.6 (L) 8.9 - 10.3 mg/dL   Total Protein 6.8 6.5 - 8.1 g/dL   Albumin 3.7 3.5 - 5.0 g/dL   AST 24 15 - 41 U/L   ALT 18 0 - 44 U/L   Alkaline Phosphatase 58 38 - 126 U/L   Total Bilirubin 0.7 0.3 - 1.2 mg/dL   GFR, Estimated >60 >60 mL/min    Comment: (NOTE) Calculated using the CKD-EPI  Creatinine Equation (2021)    Anion gap 7 5 - 15    Comment: Performed at [facility], [address] [place], [place] [address]  CBC with Differential/Platelet     Status: None   Collection Time: 06/18/20  8:26 AM  Result Value Ref Range   WBC 4.8 4.0 - 10.5 K/uL   RBC 4.36 4.22 - 5.81 MIL/uL   Hemoglobin 13.8 13.0 - 17.0 g/dL   HCT 41.2 39.0 - 52.0 %   MCV 94.5 80.0 - 100.0 fL   MCH 31.7 26.0 - 34.0 pg   MCHC 33.5 30.0 - 36.0 g/dL   RDW 12.4 11.5 - 15.5 %   Platelets 185 150 - 400 K/uL   nRBC 0.0 0.0 - 0.2 %   Neutrophils Relative % 57 %   Neutro Abs 2.7 1.7 - 7.7 K/uL   Lymphocytes Relative 18 %   Lymphs Abs 0.9 0.7 - 4.0 K/uL   Monocytes Relative 14 %   Monocytes Absolute 0.7 0.1 - 1.0 K/uL   Eosinophils Relative 10 %   Eosinophils Absolute 0.5 0.0 - 0.5 K/uL   Basophils Relative 1 %   Basophils Absolute 0.1 0.0 - 0.1 K/uL   Immature Granulocytes 0 %   Abs Immature Granulocytes 0.01 0.00 - 0.07 K/uL    Comment: Performed at [facility], [address],  [place], [place] [address]  Comprehensive metabolic panel     Status: Abnormal   Collection Time: 07/09/20  8:34 AM  Result Value Ref Range   Sodium 138 135 - 145 mmol/L   Potassium 3.9 3.5 - 5.1 mmol/L   Chloride 98 98 - 111 mmol/L   CO2 30 22 - 32 mmol/L   Glucose, Bld 101 (H) 70 - 99 mg/dL    Comment: Glucose reference range applies only to samples taken after fasting for at least 8 hours.   BUN 14 8 - 23 mg/dL   Creatinine, Ser 0.73 0.61 - 1.24 mg/dL   Calcium 9.7 8.9 - 10.3 mg/dL   Total Protein 7.0 6.5 - 8.1 g/dL   Albumin 4.1 3.5 - 5.0 g/dL   AST 26 15 - 41 U/L   ALT 17 0 - 44 U/L   Alkaline Phosphatase 57 38 - 126 U/L   Total Bilirubin 0.6 0.3 - 1.2 mg/dL   GFR, Estimated >60 >60 mL/min    Comment: (NOTE) Calculated using the CKD-EPI Creatinine Equation (2021)    Anion gap 10 5 - 15    Comment: Performed at [facility], [address] [place], [place] [address]  CBC with  Differential/Platelet     Status: None   Collection Time: 07/09/20  8:34 AM  Result Value Ref Range   WBC 4.7 4.0 - 10.5 K/uL   RBC 4.81 4.22 - 5.81 MIL/uL   Hemoglobin 15.3 13.0 - 17.0 g/dL   HCT 45.3 39.0 - 52.0 %   MCV 94.2 80.0 - 100.0 fL   MCH 31.8 26.0 - 34.0 pg   MCHC 33.8 30.0 - 36.0 g/dL   RDW 12.0 11.5 - 15.5 %   Platelets 181 150 - 400 K/uL   nRBC 0.0 0.0 - 0.2 %   Neutrophils Relative % 58 %   Neutro Abs 2.7 1.7 - 7.7 K/uL   Lymphocytes Relative 20 %   Lymphs Abs 1.0 0.7 - 4.0 K/uL   Monocytes Relative 12 %   Monocytes Absolute 0.6 0.1 - 1.0 K/uL   Eosinophils Relative 9 %   Eosinophils Absolute 0.4 0.0 - 0.5 K/uL   Basophils Relative 1 %   Basophils Absolute 0.1 0.0 - 0.1 K/uL   Immature Granulocytes 0 %   Abs Immature Granulocytes 0.01 0.00 - 0.07 K/uL    Comment: Performed at [facility], [address] [place], [place] [address]  I-STAT creatinine     Status: None   Collection Time: 08/15/20 10:14 AM  Result Value Ref Range   Creatinine, Ser 0.70 0.61 - 1.24 mg/dL  Comprehensive metabolic panel     Status: Abnormal   Collection Time: 08/20/20  9:00 AM  Result Value Ref Range   Sodium 134 (L) 135 - 145 mmol/L   Potassium 4.0 3.5 - 5.1 mmol/L   Chloride 97 (L) 98 - 111 mmol/L   CO2 29 22 - 32 mmol/L   Glucose, Bld 95 70 - 99 mg/dL    Comment: Glucose reference range applies only to samples taken after fasting for at least 8 hours.   BUN 10 8 - 23 mg/dL   Creatinine, Ser 0.58 (L) 0.61 - 1.24 mg/dL   Calcium 8.5 (L) 8.9 - 10.3 mg/dL   Total Protein 6.6 6.5 - 8.1 g/dL   Albumin 3.7 3.5 - 5.0 g/dL   AST 20 15 - 41 U/L   ALT 16 0 - 44 U/L   Alkaline Phosphatase 58 38 -  126 U/L   Total Bilirubin 0.4 0.3 - 1.2 mg/dL   GFR, Estimated >60 >60 mL/min    Comment: (NOTE) Calculated using the CKD-EPI Creatinine Equation (2021)    Anion gap 8 5 - 15    Comment: Performed at [facility], [address] [place], [place] [address]  CBC with  Differential/Platelet     Status: None   Collection Time: 08/20/20  9:00 AM  Result Value Ref Range   WBC 4.8 4.0 - 10.5 K/uL   RBC 4.59 4.22 - 5.81 MIL/uL   Hemoglobin 14.4 13.0 - 17.0 g/dL   HCT 42.4 39.0 - 52.0 %   MCV 92.4 80.0 - 100.0 fL   MCH 31.4 26.0 - 34.0 pg   MCHC 34.0 30.0 - 36.0 g/dL   RDW 11.9 11.5 - 15.5 %   Platelets 161 150 - 400 K/uL   nRBC 0.0 0.0 - 0.2 %   Neutrophils Relative % 55 %   Neutro Abs 2.6 1.7 - 7.7 K/uL   Lymphocytes Relative 19 %   Lymphs Abs 0.9 0.7 - 4.0 K/uL   Monocytes Relative 14 %   Monocytes Absolute 0.7 0.1 - 1.0 K/uL   Eosinophils Relative 11 %   Eosinophils Absolute 0.5 0.0 - 0.5 K/uL   Basophils Relative 1 %   Basophils Absolute 0.1 0.0 - 0.1 K/uL   Immature Granulocytes 0 %   Abs Immature Granulocytes 0.01 0.00 - 0.07 K/uL    Comment: Performed at [facility], [place], [place] [address]      PHQ2/9: Depression screen [facility] 2/9 08/27/2020 07/10/2020 04/10/2020 01/05/2020 12/08/2019  Decreased Interest 0 0 0 0 1  Down, Depressed, Hopeless 0 0 0 0 1  PHQ - 2 Score 0 0 0 0 2  Altered sleeping - 1 0 1 2  Tired, decreased energy - 1 0 0 1  Change in appetite - 1 0 0 0  Feeling bad or failure about yourself  - 0 0 0 1  Trouble concentrating - 0 0 0 0  Moving slowly or fidgety/restless - 0 0 0 0  Suicidal thoughts - 0 0 0 0  PHQ-9 Score - 3 0 1 6  Difficult doing work/chores - Not difficult at all - Not difficult at all Not difficult at all    phq 9 is negative   Fall Risk: Fall Risk  08/27/2020 07/10/2020 04/10/2020 01/05/2020 12/08/2019  Falls in the past year? 0 0 0 0 0  Number falls in past yr: 0 0 0 - 0  Injury with Fall? 0 0 0 - 0  Risk for fall due to : - Impaired mobility - Impaired mobility;Impaired balance/gait -    Functional Status Survey: Is the patient deaf or have difficulty hearing?: No Does the patient have difficulty seeing, even when wearing glasses/contacts?: No Does the patient have  difficulty concentrating, remembering, or making decisions?: No Does the patient have difficulty walking or climbing stairs?: Yes Does the patient have difficulty dressing or bathing?: No Does the patient have difficulty doing errands alone such as visiting a doctor's office or shopping?: No    Assessment & Plan  1. Impingement syndrome of right shoulder   Consent signed: YES  Procedure:shoulder Joint Injection Location: right shoulder  Injection approach: anterior  Equipment used: 25 gauge 1.5 inch needle Medication: 2 mL Kenalog (40mg /41mL) Anesthesia: 1% Lidocaine w/o Epinephrine Cleaned and prepped: Betadine  The risks, benefits, treatment options discussed with patient prior  to procedure.  Consent signed. Area cleansed with sterile betadine. .    Patient tolerated procedure well with no complications and no bleeding. Bandage placed at site of injection. Patient instructed on potential for steroid reaction pain within the initial 24-48hr period. May use ice packs directly on injected site as needed.  2. Cancer of upper lobe of right lung ([place])  Explained he will need a X-ray of his shoulder if no improvement with steroid injection

## 2020-08-27 ENCOUNTER — Encounter: Payer: Self-pay | Admitting: Family Medicine

## 2020-08-27 ENCOUNTER — Other Ambulatory Visit: Payer: Self-pay

## 2020-08-27 ENCOUNTER — Ambulatory Visit (INDEPENDENT_AMBULATORY_CARE_PROVIDER_SITE_OTHER): Payer: Medicare HMO | Admitting: Family Medicine

## 2020-08-27 VITALS — BP 120/72 | HR 90 | Temp 97.9°F | Resp 16 | Ht 69.0 in | Wt 121.1 lb

## 2020-08-27 DIAGNOSIS — H6120 Impacted cerumen, unspecified ear: Secondary | ICD-10-CM

## 2020-08-27 DIAGNOSIS — M7541 Impingement syndrome of right shoulder: Secondary | ICD-10-CM | POA: Diagnosis not present

## 2020-08-27 DIAGNOSIS — C3411 Malignant neoplasm of upper lobe, right bronchus or lung: Secondary | ICD-10-CM

## 2020-09-01 DIAGNOSIS — J9601 Acute respiratory failure with hypoxia: Secondary | ICD-10-CM | POA: Diagnosis not present

## 2020-09-01 DIAGNOSIS — J441 Chronic obstructive pulmonary disease with (acute) exacerbation: Secondary | ICD-10-CM | POA: Diagnosis not present

## 2020-09-05 ENCOUNTER — Other Ambulatory Visit: Payer: Self-pay

## 2020-09-05 ENCOUNTER — Inpatient Hospital Stay: Payer: Medicare HMO | Attending: Hospice and Palliative Medicine | Admitting: Hospice and Palliative Medicine

## 2020-09-05 DIAGNOSIS — N400 Enlarged prostate without lower urinary tract symptoms: Secondary | ICD-10-CM | POA: Insufficient documentation

## 2020-09-05 DIAGNOSIS — C787 Secondary malignant neoplasm of liver and intrahepatic bile duct: Secondary | ICD-10-CM | POA: Insufficient documentation

## 2020-09-05 DIAGNOSIS — M2550 Pain in unspecified joint: Secondary | ICD-10-CM | POA: Insufficient documentation

## 2020-09-05 DIAGNOSIS — C3410 Malignant neoplasm of upper lobe, unspecified bronchus or lung: Secondary | ICD-10-CM

## 2020-09-05 DIAGNOSIS — Z7289 Other problems related to lifestyle: Secondary | ICD-10-CM | POA: Insufficient documentation

## 2020-09-05 DIAGNOSIS — I700 Atherosclerosis of aorta: Secondary | ICD-10-CM | POA: Insufficient documentation

## 2020-09-05 DIAGNOSIS — Z8249 Family history of ischemic heart disease and other diseases of the circulatory system: Secondary | ICD-10-CM | POA: Insufficient documentation

## 2020-09-05 DIAGNOSIS — M549 Dorsalgia, unspecified: Secondary | ICD-10-CM | POA: Insufficient documentation

## 2020-09-05 DIAGNOSIS — Z87891 Personal history of nicotine dependence: Secondary | ICD-10-CM | POA: Insufficient documentation

## 2020-09-05 DIAGNOSIS — J449 Chronic obstructive pulmonary disease, unspecified: Secondary | ICD-10-CM | POA: Insufficient documentation

## 2020-09-05 DIAGNOSIS — C3411 Malignant neoplasm of upper lobe, right bronchus or lung: Secondary | ICD-10-CM | POA: Insufficient documentation

## 2020-09-05 DIAGNOSIS — R5383 Other fatigue: Secondary | ICD-10-CM | POA: Insufficient documentation

## 2020-09-05 DIAGNOSIS — E46 Unspecified protein-calorie malnutrition: Secondary | ICD-10-CM | POA: Insufficient documentation

## 2020-09-05 DIAGNOSIS — R64 Cachexia: Secondary | ICD-10-CM | POA: Insufficient documentation

## 2020-09-05 DIAGNOSIS — C7951 Secondary malignant neoplasm of bone: Secondary | ICD-10-CM | POA: Insufficient documentation

## 2020-09-05 DIAGNOSIS — Z515 Encounter for palliative care: Secondary | ICD-10-CM | POA: Diagnosis not present

## 2020-09-05 DIAGNOSIS — Z801 Family history of malignant neoplasm of trachea, bronchus and lung: Secondary | ICD-10-CM | POA: Insufficient documentation

## 2020-09-05 DIAGNOSIS — Z79899 Other long term (current) drug therapy: Secondary | ICD-10-CM | POA: Insufficient documentation

## 2020-09-05 DIAGNOSIS — R0602 Shortness of breath: Secondary | ICD-10-CM | POA: Insufficient documentation

## 2020-09-05 DIAGNOSIS — Z5112 Encounter for antineoplastic immunotherapy: Secondary | ICD-10-CM | POA: Insufficient documentation

## 2020-09-05 DIAGNOSIS — J439 Emphysema, unspecified: Secondary | ICD-10-CM | POA: Insufficient documentation

## 2020-09-05 DIAGNOSIS — M4854XA Collapsed vertebra, not elsewhere classified, thoracic region, initial encounter for fracture: Secondary | ICD-10-CM | POA: Insufficient documentation

## 2020-09-05 NOTE — Progress Notes (Signed)
Virtual Visit via telephone note  I connected with Cody [name] on 09/05/20 at 10:30 AM EST by a video enabled telemedicine application and verified that I am speaking with the correct person using two identifiers.   I discussed the limitations of evaluation and management by telemedicine and the availability of in person appointments. The patient expressed understanding and agreed to proceed.  History of Present Illness: Cody [name] is a 65 y.o. 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  Patient was hospitalized from 11/29/2019 to 11/30/2019 with hypoxic respiratory failure.  He was discharged home on O2.  Patient is s/p RT/chemotherapy and is now on maintenance immunotherapy.  He was referred to palliative care to help address goals and manage ongoing symptoms.    Observations/Objective: Patient reports that he is doing well.  He denies any significant changes or concerns today.  He reports that performance status has improved.  He is now ambulating independently with some support from the O2 cart.  He denies any changes to his breathing.  He remains on O2 3 to 4 L.  No pain.  Appetite is reportedly "fair".  He is drinking oral nutritional supplements once daily.  Weight down trended slightly over the last month.  Discussed increasing his oral nutritional supplements to twice daily or 3 times daily.  Assessment and Plan: Stage IV NSCLC -follow-up with Dr. Rogue Bussing next week.  Symptomatically, seems to be doing well. Will follow.   Follow Up Instructions: Follow-up MyChart visit in 1 to 2 months   I discussed the assessment and treatment plan with the patient. The patient was provided an opportunity to ask questions and all were answered. The patient agreed with the  plan and demonstrated an understanding of the instructions.   The patient was advised to call back or seek an in-person evaluation if the symptoms worsen or if the condition fails to improve as anticipated.  I provided 10 minutes of non-face-to-face time during this encounter.   Irean Hong, NP

## 2020-09-10 ENCOUNTER — Encounter: Payer: Self-pay | Admitting: Internal Medicine

## 2020-09-10 ENCOUNTER — Inpatient Hospital Stay: Payer: Medicare HMO

## 2020-09-10 ENCOUNTER — Inpatient Hospital Stay (HOSPITAL_BASED_OUTPATIENT_CLINIC_OR_DEPARTMENT_OTHER): Payer: Medicare HMO | Admitting: Internal Medicine

## 2020-09-10 DIAGNOSIS — Z79899 Other long term (current) drug therapy: Secondary | ICD-10-CM | POA: Diagnosis not present

## 2020-09-10 DIAGNOSIS — Z5112 Encounter for antineoplastic immunotherapy: Secondary | ICD-10-CM | POA: Diagnosis not present

## 2020-09-10 DIAGNOSIS — M2550 Pain in unspecified joint: Secondary | ICD-10-CM | POA: Diagnosis not present

## 2020-09-10 DIAGNOSIS — Z7189 Other specified counseling: Secondary | ICD-10-CM

## 2020-09-10 DIAGNOSIS — C7951 Secondary malignant neoplasm of bone: Secondary | ICD-10-CM | POA: Diagnosis not present

## 2020-09-10 DIAGNOSIS — M549 Dorsalgia, unspecified: Secondary | ICD-10-CM | POA: Diagnosis not present

## 2020-09-10 DIAGNOSIS — R64 Cachexia: Secondary | ICD-10-CM | POA: Diagnosis not present

## 2020-09-10 DIAGNOSIS — C787 Secondary malignant neoplasm of liver and intrahepatic bile duct: Secondary | ICD-10-CM | POA: Diagnosis not present

## 2020-09-10 DIAGNOSIS — C3411 Malignant neoplasm of upper lobe, right bronchus or lung: Secondary | ICD-10-CM

## 2020-09-10 DIAGNOSIS — Z8249 Family history of ischemic heart disease and other diseases of the circulatory system: Secondary | ICD-10-CM | POA: Diagnosis not present

## 2020-09-10 DIAGNOSIS — Z7289 Other problems related to lifestyle: Secondary | ICD-10-CM | POA: Diagnosis not present

## 2020-09-10 DIAGNOSIS — M4854XA Collapsed vertebra, not elsewhere classified, thoracic region, initial encounter for fracture: Secondary | ICD-10-CM | POA: Diagnosis not present

## 2020-09-10 DIAGNOSIS — I700 Atherosclerosis of aorta: Secondary | ICD-10-CM | POA: Diagnosis not present

## 2020-09-10 DIAGNOSIS — R5383 Other fatigue: Secondary | ICD-10-CM | POA: Diagnosis not present

## 2020-09-10 DIAGNOSIS — R0602 Shortness of breath: Secondary | ICD-10-CM | POA: Diagnosis not present

## 2020-09-10 DIAGNOSIS — E46 Unspecified protein-calorie malnutrition: Secondary | ICD-10-CM | POA: Diagnosis not present

## 2020-09-10 DIAGNOSIS — J439 Emphysema, unspecified: Secondary | ICD-10-CM | POA: Diagnosis not present

## 2020-09-10 DIAGNOSIS — N400 Enlarged prostate without lower urinary tract symptoms: Secondary | ICD-10-CM | POA: Diagnosis not present

## 2020-09-10 DIAGNOSIS — Z87891 Personal history of nicotine dependence: Secondary | ICD-10-CM | POA: Diagnosis not present

## 2020-09-10 DIAGNOSIS — C3410 Malignant neoplasm of upper lobe, unspecified bronchus or lung: Secondary | ICD-10-CM

## 2020-09-10 DIAGNOSIS — J449 Chronic obstructive pulmonary disease, unspecified: Secondary | ICD-10-CM | POA: Diagnosis not present

## 2020-09-10 DIAGNOSIS — Z801 Family history of malignant neoplasm of trachea, bronchus and lung: Secondary | ICD-10-CM | POA: Diagnosis not present

## 2020-09-10 LAB — CBC WITH DIFFERENTIAL/PLATELET
Abs Immature Granulocytes: 0.01 10*3/uL (ref 0.00–0.07)
Basophils Absolute: 0.1 10*3/uL (ref 0.0–0.1)
Basophils Relative: 1 %
Eosinophils Absolute: 0.4 10*3/uL (ref 0.0–0.5)
Eosinophils Relative: 7 %
HCT: 45 % (ref 39.0–52.0)
Hemoglobin: 15.4 g/dL (ref 13.0–17.0)
Immature Granulocytes: 0 %
Lymphocytes Relative: 22 %
Lymphs Abs: 1.1 10*3/uL (ref 0.7–4.0)
MCH: 31.1 pg (ref 26.0–34.0)
MCHC: 34.2 g/dL (ref 30.0–36.0)
MCV: 90.9 fL (ref 80.0–100.0)
Monocytes Absolute: 0.6 10*3/uL (ref 0.1–1.0)
Monocytes Relative: 12 %
Neutro Abs: 3.1 10*3/uL (ref 1.7–7.7)
Neutrophils Relative %: 58 %
Platelets: 174 10*3/uL (ref 150–400)
RBC: 4.95 MIL/uL (ref 4.22–5.81)
RDW: 12.7 % (ref 11.5–15.5)
WBC: 5.2 10*3/uL (ref 4.0–10.5)
nRBC: 0 % (ref 0.0–0.2)

## 2020-09-10 LAB — COMPREHENSIVE METABOLIC PANEL
ALT: 16 U/L (ref 0–44)
AST: 23 U/L (ref 15–41)
Albumin: 4.1 g/dL (ref 3.5–5.0)
Alkaline Phosphatase: 64 U/L (ref 38–126)
Anion gap: 10 (ref 5–15)
BUN: 11 mg/dL (ref 8–23)
CO2: 31 mmol/L (ref 22–32)
Calcium: 9 mg/dL (ref 8.9–10.3)
Chloride: 95 mmol/L — ABNORMAL LOW (ref 98–111)
Creatinine, Ser: 0.61 mg/dL (ref 0.61–1.24)
GFR, Estimated: >60 mL/min (ref >60)
Glucose, Bld: 98 mg/dL (ref 70–99)
Potassium: 3.9 mmol/L (ref 3.5–5.1)
Sodium: 136 mmol/L (ref 135–145)
Total Bilirubin: 0.6 mg/dL (ref 0.3–1.2)
Total Protein: 7.6 g/dL (ref 6.5–8.1)

## 2020-09-10 MED ORDER — ZOLEDRONIC ACID 4 MG/5ML IV CONC
3.0000 mg | Freq: Once | INTRAVENOUS | Status: AC
  Administered 2020-09-10: 3 mg via INTRAVENOUS
  Filled 2020-09-10: qty 3.75

## 2020-09-10 MED ORDER — SODIUM CHLORIDE 0.9 % IV SOLN
200.0000 mg | Freq: Once | INTRAVENOUS | Status: AC
  Administered 2020-09-10: 200 mg via INTRAVENOUS
  Filled 2020-09-10: qty 8

## 2020-09-10 MED ORDER — HEPARIN SOD (PORK) LOCK FLUSH 100 UNIT/ML IV SOLN
500.0000 [IU] | Freq: Once | INTRAVENOUS | Status: AC | PRN
  Administered 2020-09-10: 500 [IU]
  Filled 2020-09-10: qty 5

## 2020-09-10 MED ORDER — SODIUM CHLORIDE 0.9 % IV SOLN
Freq: Once | INTRAVENOUS | Status: AC
  Filled 2020-09-10: qty 250

## 2020-09-10 MED ORDER — HEPARIN SOD (PORK) LOCK FLUSH 100 UNIT/ML IV SOLN
INTRAVENOUS | Status: AC
  Filled 2020-09-10: qty 5

## 2020-09-10 NOTE — Progress Notes (Signed)
1238- Patient tolerated treatment well. Patient stable and discharged to home at this time.

## 2020-09-10 NOTE — Assessment & Plan Note (Addendum)
#   Stage IV metastatic non-small cell lung cancer favor adeno; mets to bone; liver.  Currently on Keytruda maintenance; JAN 19th, 2022-stable right upper lobe lesion; slight decrease in the hepatic metastatic disease; stable T3-T4 compression fractures/T1 T6 spinal fusion. STABLE.   # Proceed with [place]. Labs today reviewed;  acceptable for treatment today.   # Bone metastases- on zometa 3 mg IVPB.  Every 6 weeks. Ca-9.0;  on  ca+ vit D BID; proceed with Zometa today.  #Malnutrition/cachexia-continue weight loss/underlying malignancy.  S/p nutrition evaluation-STABLE.   # COPD- STABLE;  Trelegy.  #Debility-spinal cord compression/status post surgery-on physical therapy--STABLE  # DISPOSITION: # Keytruda;  ZOMETA # follow up in 3 weeks - MD;labs- cbc/cmp; Keytruda;;Dr.B

## 2020-09-10 NOTE — Progress Notes (Signed)
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   # MAY 26th-Keytrda x2 cycles; [borderline PS] July 7th-carbo Alimta Keytruda cycle #1  # NGS/MOLECULAR TESTS:   # PALLIATIVE CARE EVALUATION:  # PAIN MANAGEMENT:    DIAGNOSIS:   STAGE:         ;  GOALS:  CURRENT/MOST RECENT THERAPY :     Cancer of upper lobe of right lung ([place])  11/23/2019 Initial Diagnosis   Cancer of upper lobe of right lung ([place])   12/21/2019 -  Chemotherapy    Patient is on Treatment Plan: LUNG CARBOPLATIN / PEMETREXED / PEMBROLIZUMAB Q21D INDUCTION X 4 CYCLES / MAINTENANCE PEMETREXED + PEMBROLIZUMAB       HISTORY OF PRESENTING ILLNESS:  Cody [name] 65 y.o.  male patient with metastatic non-small cell lung cancer; cord compression status post decompressive surgery; currently on Keytruda-is here for follow-up.  Patient denies any worsening shortness of breath or cough.  Denies any nausea vomiting.  No headaches.  He is able to walk short distances without any assistive devices.  He continues spent 2 L nasal cannula oxygen.  . Review of Systems  Constitutional: Positive for malaise/fatigue. Negative for chills, diaphoresis and fever.  HENT: Negative for nosebleeds and sore throat.   Eyes: Negative for double vision.  Respiratory: Positive for shortness of breath. Negative for  cough, hemoptysis, sputum production and wheezing.   Cardiovascular: Negative for chest pain, palpitations, orthopnea and leg swelling.  Gastrointestinal: Negative for abdominal pain, blood in stool, constipation, heartburn, melena, nausea and vomiting.  Genitourinary: Negative for dysuria, frequency and urgency.  Musculoskeletal: Positive for back pain and joint pain.  Skin: Negative.  Negative for itching and rash.  Neurological: Negative for dizziness, tingling, focal weakness, weakness and headaches.  Endo/Heme/Allergies: Does not bruise/bleed easily.  Psychiatric/Behavioral: Negative for depression. The patient is not nervous/anxious and does not have insomnia.      MEDICAL HISTORY:  Past Medical History:  Diagnosis Date  . Allergy   . Cancer of upper lobe of right lung ([place]) 10/2019  . COPD (chronic obstructive pulmonary disease) ([place])   . Prostate enlargement     SURGICAL HISTORY: Past Surgical History:  Procedure Laterality Date  . HERNIA REPAIR Bilateral 1982  . HERNIA REPAIR Right 1994  . IR IMAGING GUIDED PORT INSERTION  02/24/2020  . LAMINECTOMY FOR EXCISION / EVACUATION INTRASPINAL LESION  11/07/2019   T1-T 6 at [facility]     SOCIAL HISTORY: Social History   Socioeconomic History  . Marital status: Married    Spouse name: [name]   . Number of children: 4  . Years of education: Not on file  . Highest education level: Some college, no degree  Occupational History  . Occupation: dispatch and delivery   Tobacco Use  . Smoking status: Former Smoker    Packs/day: 2.00    Years: 30.00    Pack years: 60.00    Quit date: 07/29/2011  Years since quitting: 9.1  . Smokeless tobacco: Never Used  . Tobacco comment: pt vapes  Vaping Use  . Vaping Use: Not on file  Substance and Sexual Activity  . Alcohol use: Yes    Alcohol/week: 5.0 standard drinks    Types: 5 Standard drinks or equivalent per week    Comment: 1-2/day  . Drug use: No  . Sexual activity: Not Currently     Partners: Female  Other Topics Concern  . Not on file  Social History Narrative   Worked in warehouse/ quit smoking in 2015; beer 1/day; in [place]; with wife.    Social Determinants of Health   Financial Resource Strain: Low Risk   . Difficulty of Paying Living Expenses: Not hard at all  Food Insecurity: No Food Insecurity  . Worried About Charity fundraiser in the Last Year: Never true  . Ran Out of Food in the Last Year: Never true  Transportation Needs: No Transportation Needs  . Lack of Transportation (Medical): No  . Lack of Transportation (Non-Medical): No  Physical Activity: Insufficiently Active  . Days of Exercise per Week: 7 days  . Minutes of Exercise per Session: 20 min  Stress: No Stress Concern Present  . Feeling of Stress : Not at all  Social Connections: Socially Integrated  . Frequency of Communication with Friends and Family: More than three times a week  . Frequency of Social Gatherings with Friends and Family: More than three times a week  . Attends Religious Services: More than 4 times per year  . Active Member of Clubs or Organizations: Yes  . Attends Archivist Meetings: More than 4 times per year  . Marital Status: Married  Human resources officer Violence: Not At Risk  . Fear of Current or Ex-Partner: No  . Emotionally Abused: No  . Physically Abused: No  . Sexually Abused: No    FAMILY HISTORY: Family History  Problem Relation Age of Onset  . Heart disease Father   . CVA Mother   . Lung cancer Sister   . Lung cancer Brother     ALLERGIES:  has No Known Allergies.  MEDICATIONS:  Current Outpatient Medications  Medication Sig Dispense Refill  . acetaminophen (TYLENOL) 500 MG tablet Take 500-1,000 mg by mouth every 6 (six) hours as needed for mild pain or fever.     Marland Kitchen albuterol (VENTOLIN HFA) 108 (90 Base) MCG/ACT inhaler Inhale 2 puffs into the lungs every 4 (four) hours as needed for wheezing or shortness of breath. 18 g 0  .  Calcium Carb-Cholecalciferol (OYSTER SHELL CALCIUM) 500-400 MG-UNIT TABS Take 500 tablets by mouth.    . escitalopram (LEXAPRO) 5 MG tablet Take 1 tablet (5 mg total) by mouth daily. am's 90 tablet 1  . Fluticasone-Umeclidin-Vilant (TRELEGY ELLIPTA) 100-62.5-25 MCG/INH AEPB Inhale 1 puff into the lungs daily. 180 each 1  . lidocaine-prilocaine (EMLA) cream Apply 1 application topically as needed. Apply small amount to port site at least 1 hour prior to it being accessed, cover with plastic wrap 30 g 1  . Multiple Vitamins-Minerals (MULTIVITAMIN GUMMIES MENS PO) Take 1 Dose by mouth daily.     . ondansetron (ZOFRAN) 4 MG tablet Take 1 tablet (4 mg total) by mouth every 8 (eight) hours as needed for nausea or vomiting. 20 tablet 2  . prochlorperazine (COMPAZINE) 10 MG tablet Take 1 tablet (10 mg total) by mouth every 6 (six) hours as needed for nausea or vomiting. 40 tablet 1  .  QUEtiapine (SEROQUEL) 25 MG tablet Take 1 tablet (25 mg total) by mouth at bedtime. 90 tablet 1   No current facility-administered medications for this visit.      Marland Kitchen  PHYSICAL EXAMINATION: ECOG PERFORMANCE STATUS: 3 - Symptomatic, >50% confined to bed  Vitals:   09/10/20 0919  BP: 126/84  Pulse: 71  Resp: 16  Temp: (!) 96.8 F (36 C)  SpO2: 100%   Filed Weights   09/10/20 0919  Weight: 119 lb (54 kg)    Physical Exam Constitutional:      Comments: Thin built frail appearing male patient.  No acute distress.  HEENT negative.  Accompanied by his daughter  HENT:     Head: Normocephalic and atraumatic.     Mouth/Throat:     Pharynx: No oropharyngeal exudate.  Eyes:     Pupils: Pupils are equal, round, and reactive to light.  Cardiovascular:     Rate and Rhythm: Normal rate and regular rhythm.  Pulmonary:     Effort: No respiratory distress.     Breath sounds: No wheezing.     Comments: Decreased air entry bilaterally. Abdominal:     General: Bowel sounds are normal. There is no distension.      Palpations: Abdomen is soft. There is no mass.     Tenderness: There is no abdominal tenderness. There is no guarding or rebound.  Musculoskeletal:        General: No tenderness. Normal range of motion.     Cervical back: Normal range of motion and neck supple.  Skin:    General: Skin is warm.  Neurological:     Mental Status: He is alert and oriented to person, place, and time.  Psychiatric:        Mood and Affect: Affect normal.      LABORATORY DATA:  I have reviewed the data as listed Lab Results  Component Value Date   WBC 5.2 09/10/2020   HGB 15.4 09/10/2020   HCT 45.0 09/10/2020   MCV 90.9 09/10/2020   PLT 174 09/10/2020   Recent Labs    03/14/20 0831 04/04/20 0827 04/25/20 0834 05/16/20 0824 07/09/20 0834 08/15/20 1014 08/20/20 0900 09/10/20 0857  NA 139 137 139   < > 138  --  134* 136  K 3.9 4.3 4.2   < > 3.9  --  4.0 3.9  CL 103 99 102   < > 98  --  97* 95*  CO2 29 28 28    < > 30  --  29 31  GLUCOSE 104* 181* 102*   < > 101*  --  95 98  BUN 19 19 15    < > 14  --  10 11  CREATININE 0.56* 0.61 0.60*   < > 0.73 0.70 0.58* 0.61  CALCIUM 8.8* 8.8* 8.8*   < > 9.7  --  8.5* 9.0  GFRNONAA >60 >60 >60   < > >60  --  >60 >60  GFRAA >60 >60 >60  --   --   --   --   --   PROT 6.9 6.7 6.4*   < > 7.0  --  6.6 7.6  ALBUMIN 3.8 3.7 3.7   < > 4.1  --  3.7 4.1  AST 24 29 23    < > 26  --  20 23  ALT 20 20 19    < > 17  --  16 16  ALKPHOS 82 77 66   < > 57  --  58 64  BILITOT 0.5 0.6 0.6   < > 0.6  --  0.4 0.6   < > = values in this interval not displayed.    RADIOGRAPHIC STUDIES: I have personally reviewed the radiological images as listed and agreed with the findings in the report. CT CHEST ABDOMEN PELVIS W CONTRAST  Result Date: 08/15/2020 CLINICAL DATA:  Non-small cell lung cancer assess treatment response. EXAM: CT CHEST, ABDOMEN, AND PELVIS WITH CONTRAST TECHNIQUE: Multidetector CT imaging of the chest, abdomen and pelvis was performed following the standard  protocol during bolus administration of intravenous contrast. CONTRAST:  170m OMNIPAQUE IOHEXOL 300 MG/ML  SOLN COMPARISON:  April 30, 2020 FINDINGS: CT CHEST FINDINGS Cardiovascular: RIGHT-sided Port-A-Cath terminates at the caval to atrial junction. Aortic caliber is normal. Calcified and noncalcified atheromatous plaque in the thoracic aorta as before. Heart size normal without pericardial effusion. Central pulmonary vasculature is normal caliber. Mediastinum/Nodes: No adenopathy in the chest. Esophagus grossly normal. Thoracic inlet structures unremarkable. Lungs/Pleura: Pulmonary emphysema with pleural and parenchymal scarring at the RIGHT lung apex with similar appearance. Nodular area along the anterior RIGHT chest (image 32, series 4) this measures 1.8 x 1.7 cm and is unchanged. Other areas of pleural thickening and bandlike scarring are similar as well. Small intrapulmonary lymph node along the minor fissure in the RIGHT chest is stable. No consolidation. No pleural effusion. No new pulmonary nodule. Musculoskeletal: See below for full musculoskeletal detail. Thoracic spinal fusion and laminectomy partially imaged. Signs of bony destruction in the thoracic spine with similar appearance. CT ABDOMEN PELVIS FINDINGS Hepatobiliary: Areas of low attenuation in the liver compatible with metastatic lesions. (Image 60, series 2) 1.4 x 1.2 cm lesion previously approximately 1.4 x 1.4 cm. This is in the RIGHT hepatic lobe, hepatic subsegment VIII Lesion adjacent to RIGHT portal pedicle (image 68, series 2) 1.4 x 1.8 cm previously approximately 1.8 x 1.9 cm. Adjacent smaller lesion and associated biliary duct distension without change. Small lesion in the periphery of the RIGHT hepatic lobe (image 61, series 2) unchanged approximately 1 cm. No new lesion in the liver. The portal vein is patent. Pancreas: Normal, without mass, inflammation or ductal dilatation. Spleen: Spleen is normal. Adrenals/Urinary Tract:  Adrenal glands are normal. Symmetric renal enhancement. No hydronephrosis. Stomach/Bowel: No acute gastrointestinal process. Vascular/Lymphatic: Calcified and noncalcified atheromatous plaque in the abdominal aorta. No aneurysmal dilation. There is no gastrohepatic or hepatoduodenal ligament lymphadenopathy. No retroperitoneal or mesenteric lymphadenopathy. No pelvic sidewall lymphadenopathy. Reproductive: Prostate heterogeneous, nonspecific finding on CT. Other: No ascites. Musculoskeletal: Soft tissue component of paravertebral mass is not well-defined, adjacent to T1 through T6 fusion, essentially not measurable due to streak artifact from adjacent fusion hardware and unchanged from the study of October of 2021 fusion hardware expansions from T1 through T6 as before. Also with unchanged appearance of irregularity of posterior ribs 4 and 5 as well as 6 the RIGHT and associated fractures of T3 and T4. No new where she destructive bone lesion. Ovoid low-density area along the lower margin of RIGHT inguinal herniorrhaphy changes anterior to the inguinal canal unchanged approximately 1.8 x 1.5 cm IMPRESSION: 1. Stable nodular area amidst pleural and parenchymal scarring in the RIGHT upper lobe. 2. Potential slight decrease in size of hepatic metastatic lesions. 3. Signs of bony involvement in ribs with spine and compression T3-T4 compression fractures spanned by T1-T6 spinal fusion and laminectomy with similar appearance. Associated soft tissue mass essentially not measurable on today's study grossly unchanged. 4. No new signs of  disease. 5. Ovoid low-density area along the lower margin of RIGHT inguinal herniorrhaphy changes anterior to the inguinal canal unchanged, significance uncertain. Likely related to prior herniorrhaphy. Could consider focused ultrasound for further evaluation as warranted, potentially a seroma or even sebaceous cyst. 6. Aortic atherosclerosis. Aortic Atherosclerosis (ICD10-I70.0) and  Emphysema (ICD10-J43.9). Electronically Signed   By: Zetta Bills M.D.   On: 08/15/2020 11:01    ASSESSMENT & PLAN:   Cancer of upper lobe of right lung ([place]) # Stage IV metastatic non-small cell lung cancer favor adeno; mets to bone; liver.  Currently on Keytruda maintenance; JAN 19th, 2022-stable right upper lobe lesion; slight decrease in the hepatic metastatic disease; stable T3-T4 compression fractures/T1 T6 spinal fusion. STABLE.   # Proceed with [place]. Labs today reviewed;  acceptable for treatment today.   # Bone metastases- on zometa 3 mg IVPB.  Every 6 weeks. Ca-9.0;  on  ca+ vit D BID; proceed with Zometa today.  #Malnutrition/cachexia-continue weight loss/underlying malignancy.  S/p nutrition evaluation-STABLE.   # COPD- STABLE;  Trelegy.  #Debility-spinal cord compression/status post surgery-on physical therapy--STABLE  # DISPOSITION: # Keytruda;  ZOMETA # follow up in 3 weeks - MD;labs- cbc/cmp; Keytruda;;Dr.B     All questions were answered. The patient knows to call the clinic with any problems, questions or concerns.    Cammie Sickle, MD 09/10/2020 10:24 AM

## 2020-09-29 DIAGNOSIS — J9601 Acute respiratory failure with hypoxia: Secondary | ICD-10-CM | POA: Diagnosis not present

## 2020-09-29 DIAGNOSIS — J441 Chronic obstructive pulmonary disease with (acute) exacerbation: Secondary | ICD-10-CM | POA: Diagnosis not present

## 2020-10-01 ENCOUNTER — Inpatient Hospital Stay: Payer: Medicare HMO | Attending: Internal Medicine

## 2020-10-01 ENCOUNTER — Inpatient Hospital Stay (HOSPITAL_BASED_OUTPATIENT_CLINIC_OR_DEPARTMENT_OTHER): Payer: Medicare HMO | Admitting: Internal Medicine

## 2020-10-01 ENCOUNTER — Inpatient Hospital Stay: Payer: Medicare HMO

## 2020-10-01 DIAGNOSIS — C3411 Malignant neoplasm of upper lobe, right bronchus or lung: Secondary | ICD-10-CM

## 2020-10-01 DIAGNOSIS — Z801 Family history of malignant neoplasm of trachea, bronchus and lung: Secondary | ICD-10-CM | POA: Insufficient documentation

## 2020-10-01 DIAGNOSIS — E46 Unspecified protein-calorie malnutrition: Secondary | ICD-10-CM | POA: Diagnosis not present

## 2020-10-01 DIAGNOSIS — M549 Dorsalgia, unspecified: Secondary | ICD-10-CM | POA: Diagnosis not present

## 2020-10-01 DIAGNOSIS — M2550 Pain in unspecified joint: Secondary | ICD-10-CM | POA: Diagnosis not present

## 2020-10-01 DIAGNOSIS — R5383 Other fatigue: Secondary | ICD-10-CM | POA: Diagnosis not present

## 2020-10-01 DIAGNOSIS — C7951 Secondary malignant neoplasm of bone: Secondary | ICD-10-CM | POA: Insufficient documentation

## 2020-10-01 DIAGNOSIS — J449 Chronic obstructive pulmonary disease, unspecified: Secondary | ICD-10-CM | POA: Insufficient documentation

## 2020-10-01 DIAGNOSIS — Z7189 Other specified counseling: Secondary | ICD-10-CM

## 2020-10-01 DIAGNOSIS — Z981 Arthrodesis status: Secondary | ICD-10-CM | POA: Insufficient documentation

## 2020-10-01 DIAGNOSIS — Z7289 Other problems related to lifestyle: Secondary | ICD-10-CM | POA: Diagnosis not present

## 2020-10-01 DIAGNOSIS — M4854XA Collapsed vertebra, not elsewhere classified, thoracic region, initial encounter for fracture: Secondary | ICD-10-CM | POA: Insufficient documentation

## 2020-10-01 DIAGNOSIS — C787 Secondary malignant neoplasm of liver and intrahepatic bile duct: Secondary | ICD-10-CM | POA: Insufficient documentation

## 2020-10-01 DIAGNOSIS — Z8249 Family history of ischemic heart disease and other diseases of the circulatory system: Secondary | ICD-10-CM | POA: Diagnosis not present

## 2020-10-01 DIAGNOSIS — Z5112 Encounter for antineoplastic immunotherapy: Secondary | ICD-10-CM | POA: Diagnosis not present

## 2020-10-01 DIAGNOSIS — Z79899 Other long term (current) drug therapy: Secondary | ICD-10-CM | POA: Insufficient documentation

## 2020-10-01 DIAGNOSIS — Z87891 Personal history of nicotine dependence: Secondary | ICD-10-CM | POA: Diagnosis not present

## 2020-10-01 DIAGNOSIS — G9529 Other cord compression: Secondary | ICD-10-CM | POA: Diagnosis not present

## 2020-10-01 DIAGNOSIS — R64 Cachexia: Secondary | ICD-10-CM | POA: Insufficient documentation

## 2020-10-01 DIAGNOSIS — R0602 Shortness of breath: Secondary | ICD-10-CM | POA: Insufficient documentation

## 2020-10-01 DIAGNOSIS — C3410 Malignant neoplasm of upper lobe, unspecified bronchus or lung: Secondary | ICD-10-CM

## 2020-10-01 LAB — COMPREHENSIVE METABOLIC PANEL
ALT: 18 U/L (ref 0–44)
AST: 24 U/L (ref 15–41)
Albumin: 4 g/dL (ref 3.5–5.0)
Alkaline Phosphatase: 69 U/L (ref 38–126)
Anion gap: 10 (ref 5–15)
BUN: 9 mg/dL (ref 8–23)
CO2: 30 mmol/L (ref 22–32)
Calcium: 8.9 mg/dL (ref 8.9–10.3)
Chloride: 97 mmol/L — ABNORMAL LOW (ref 98–111)
Creatinine, Ser: 0.67 mg/dL (ref 0.61–1.24)
GFR, Estimated: >60 mL/min (ref >60)
Glucose, Bld: 96 mg/dL (ref 70–99)
Potassium: 4.1 mmol/L (ref 3.5–5.1)
Sodium: 137 mmol/L (ref 135–145)
Total Bilirubin: 0.8 mg/dL (ref 0.3–1.2)
Total Protein: 7.3 g/dL (ref 6.5–8.1)

## 2020-10-01 LAB — CBC WITH DIFFERENTIAL/PLATELET
Abs Immature Granulocytes: 0.01 10*3/uL (ref 0.00–0.07)
Basophils Absolute: 0.1 10*3/uL (ref 0.0–0.1)
Basophils Relative: 1 %
Eosinophils Absolute: 0.4 10*3/uL (ref 0.0–0.5)
Eosinophils Relative: 8 %
HCT: 44.9 % (ref 39.0–52.0)
Hemoglobin: 15 g/dL (ref 13.0–17.0)
Immature Granulocytes: 0 %
Lymphocytes Relative: 19 %
Lymphs Abs: 1.1 10*3/uL (ref 0.7–4.0)
MCH: 31.1 pg (ref 26.0–34.0)
MCHC: 33.4 g/dL (ref 30.0–36.0)
MCV: 93.2 fL (ref 80.0–100.0)
Monocytes Absolute: 0.6 10*3/uL (ref 0.1–1.0)
Monocytes Relative: 11 %
Neutro Abs: 3.4 10*3/uL (ref 1.7–7.7)
Neutrophils Relative %: 61 %
Platelets: 183 10*3/uL (ref 150–400)
RBC: 4.82 MIL/uL (ref 4.22–5.81)
RDW: 13.2 % (ref 11.5–15.5)
WBC: 5.6 10*3/uL (ref 4.0–10.5)
nRBC: 0 % (ref 0.0–0.2)

## 2020-10-01 MED ORDER — SODIUM CHLORIDE 0.9% FLUSH
10.0000 mL | Freq: Once | INTRAVENOUS | Status: AC
  Administered 2020-10-01: 10 mL via INTRAVENOUS
  Filled 2020-10-01: qty 10

## 2020-10-01 MED ORDER — SODIUM CHLORIDE 0.9 % IV SOLN
200.0000 mg | Freq: Once | INTRAVENOUS | Status: AC
  Administered 2020-10-01: 200 mg via INTRAVENOUS
  Filled 2020-10-01: qty 8

## 2020-10-01 MED ORDER — HEPARIN SOD (PORK) LOCK FLUSH 100 UNIT/ML IV SOLN
500.0000 [IU] | Freq: Once | INTRAVENOUS | Status: AC
  Administered 2020-10-01: 500 [IU] via INTRAVENOUS
  Filled 2020-10-01: qty 5

## 2020-10-01 MED ORDER — SODIUM CHLORIDE 0.9 % IV SOLN
Freq: Once | INTRAVENOUS | Status: AC
  Filled 2020-10-01: qty 250

## 2020-10-01 MED ORDER — HEPARIN SOD (PORK) LOCK FLUSH 100 UNIT/ML IV SOLN
INTRAVENOUS | Status: AC
  Filled 2020-10-01: qty 5

## 2020-10-01 NOTE — Assessment & Plan Note (Addendum)
#   Stage IV metastatic non-small cell lung cancer favor adeno; mets to bone; liver.  Currently on Keytruda maintenance; JAN 19th, 2022-stable right upper lobe lesion; slight decrease in the hepatic metastatic disease; stable T3-T4 compression fractures/T1 T6 spinal fusion. STABLE.    # Proceed with [place]. Labs today reviewed;  acceptable for treatment today.   # Bone metastases- on zometa 3 mg IVPB.  Every 6 weeks. Ca-9.0;  on  ca+ vit D BID  #Malnutrition/cachexia-continue weight loss/underlying malignancy.  S/p nutrition evaluation-STABLE.   # COPD- STABLE;  Trelegy.  #Debility-spinal cord compression/status post surgery-s/p physical therapy--STABLE  # DISPOSITION: # Keytruda;   # follow up in 3 weeks - MD;labs- cbc/cmp; Keytruda;Zometa;;Dr.B

## 2020-10-01 NOTE — Progress Notes (Signed)
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   # MAY 26th-Keytrda x2 cycles; [borderline PS] July 7th-carbo Alimta Keytruda cycle #1  # NGS/MOLECULAR TESTS:   # PALLIATIVE CARE EVALUATION:  # PAIN MANAGEMENT:    DIAGNOSIS:   STAGE:         ;  GOALS:  CURRENT/MOST RECENT THERAPY :     Cancer of upper lobe of right lung ([place])  11/23/2019 Initial Diagnosis   Cancer of upper lobe of right lung ([place])   12/21/2019 -  Chemotherapy    Patient is on Treatment Plan: LUNG CARBOPLATIN / PEMETREXED / PEMBROLIZUMAB Q21D INDUCTION X 4 CYCLES / MAINTENANCE PEMETREXED + PEMBROLIZUMAB       HISTORY OF PRESENTING ILLNESS:  Cody [name] 65 y.o.  male patient with metastatic non-small cell lung cancer; cord compression status post decompressive surgery; currently on Keytruda-is here for follow-up.  Patient denies any nausea vomiting or diarrhea.  No headaches.  No falls.  He continues to walk small distances without any assistive devices.  Continues to be on 2 L of nasal cannula oxygen.  . Review of Systems  Constitutional: Positive for malaise/fatigue. Negative for chills, diaphoresis and fever.  HENT: Negative for nosebleeds and sore throat.   Eyes: Negative for double vision.   Respiratory: Positive for shortness of breath. Negative for cough, hemoptysis, sputum production and wheezing.   Cardiovascular: Negative for chest pain, palpitations, orthopnea and leg swelling.  Gastrointestinal: Negative for abdominal pain, blood in stool, constipation, heartburn, melena, nausea and vomiting.  Genitourinary: Negative for dysuria, frequency and urgency.  Musculoskeletal: Positive for back pain and joint pain.  Skin: Negative.  Negative for itching and rash.  Neurological: Negative for dizziness, tingling, focal weakness, weakness and headaches.  Endo/Heme/Allergies: Does not bruise/bleed easily.  Psychiatric/Behavioral: Negative for depression. The patient is not nervous/anxious and does not have insomnia.      MEDICAL HISTORY:  Past Medical History:  Diagnosis Date  . Allergy   . Cancer of upper lobe of right lung ([place]) 10/2019  . COPD (chronic obstructive pulmonary disease) ([place])   . Prostate enlargement     SURGICAL HISTORY: Past Surgical History:  Procedure Laterality Date  . HERNIA REPAIR Bilateral 1982  . HERNIA REPAIR Right 1994  . IR IMAGING GUIDED PORT INSERTION  02/24/2020  . LAMINECTOMY FOR EXCISION / EVACUATION INTRASPINAL LESION  11/07/2019   T1-T 6 at [facility]     SOCIAL HISTORY: Social History   Socioeconomic History  . Marital status: Married    Spouse name: [name]   . Number of children: 4  . Years of education: Not on file  . Highest education level: Some college, no degree  Occupational History  . Occupation: dispatch and delivery   Tobacco Use  . Smoking status: Former Smoker    Packs/day: 2.00    Years: 30.00    Pack years:  60.00    Quit date: 07/29/2011    Years since quitting: 9.1  . Smokeless tobacco: Never Used  . Tobacco comment: pt vapes  Vaping Use  . Vaping Use: Not on file  Substance and Sexual Activity  . Alcohol use: Yes    Alcohol/week: 5.0 standard drinks    Types: 5 Standard drinks or equivalent per week     Comment: 1-2/day  . Drug use: No  . Sexual activity: Not Currently    Partners: Female  Other Topics Concern  . Not on file  Social History Narrative   Worked in warehouse/ quit smoking in 2015; beer 1/day; in [place]; with wife.    Social Determinants of Health   Financial Resource Strain: Low Risk   . Difficulty of Paying Living Expenses: Not hard at all  Food Insecurity: No Food Insecurity  . Worried About Charity fundraiser in the Last Year: Never true  . Ran Out of Food in the Last Year: Never true  Transportation Needs: No Transportation Needs  . Lack of Transportation (Medical): No  . Lack of Transportation (Non-Medical): No  Physical Activity: Insufficiently Active  . Days of Exercise per Week: 7 days  . Minutes of Exercise per Session: 20 min  Stress: No Stress Concern Present  . Feeling of Stress : Not at all  Social Connections: Socially Integrated  . Frequency of Communication with Friends and Family: More than three times a week  . Frequency of Social Gatherings with Friends and Family: More than three times a week  . Attends Religious Services: More than 4 times per year  . Active Member of Clubs or Organizations: Yes  . Attends Archivist Meetings: More than 4 times per year  . Marital Status: Married  Human resources officer Violence: Not At Risk  . Fear of Current or Ex-Partner: No  . Emotionally Abused: No  . Physically Abused: No  . Sexually Abused: No    FAMILY HISTORY: Family History  Problem Relation Age of Onset  . Heart disease Father   . CVA Mother   . Lung cancer Sister   . Lung cancer Brother     ALLERGIES:  has No Known Allergies.  MEDICATIONS:  Current Outpatient Medications  Medication Sig Dispense Refill  . acetaminophen (TYLENOL) 500 MG tablet Take 500-1,000 mg by mouth every 6 (six) hours as needed for mild pain or fever.     Marland Kitchen albuterol (VENTOLIN HFA) 108 (90 Base) MCG/ACT inhaler Inhale 2 puffs into the lungs every 4  (four) hours as needed for wheezing or shortness of breath. 18 g 0  . Calcium Carb-Cholecalciferol (OYSTER SHELL CALCIUM) 500-400 MG-UNIT TABS Take 500 tablets by mouth.    . escitalopram (LEXAPRO) 5 MG tablet Take 1 tablet (5 mg total) by mouth daily. am's 90 tablet 1  . Fluticasone-Umeclidin-Vilant (TRELEGY ELLIPTA) 100-62.5-25 MCG/INH AEPB Inhale 1 puff into the lungs daily. 180 each 1  . lidocaine-prilocaine (EMLA) cream Apply 1 application topically as needed. Apply small amount to port site at least 1 hour prior to it being accessed, cover with plastic wrap 30 g 1  . Multiple Vitamins-Minerals (MULTIVITAMIN GUMMIES MENS PO) Take 1 Dose by mouth daily.     . ondansetron (ZOFRAN) 4 MG tablet Take 1 tablet (4 mg total) by mouth every 8 (eight) hours as needed for nausea or vomiting. 20 tablet 2  . prochlorperazine (COMPAZINE) 10 MG tablet Take 1 tablet (10 mg total) by mouth every 6 (six) hours  as needed for nausea or vomiting. 40 tablet 1  . QUEtiapine (SEROQUEL) 25 MG tablet Take 1 tablet (25 mg total) by mouth at bedtime. 90 tablet 1   No current facility-administered medications for this visit.   Facility-Administered Medications Ordered in Other Visits  Medication Dose Route Frequency Provider Last Rate Last Admin  . heparin lock flush 100 unit/mL  500 Units Intravenous Once Charlaine Dalton R, MD      . pembrolizumab [facility]) 200 mg in sodium chloride 0.9 % 50 mL chemo infusion  200 mg Intravenous Once Charlaine Dalton R, MD          .  PHYSICAL EXAMINATION: ECOG PERFORMANCE STATUS: 3 - Symptomatic, >50% confined to bed  Vitals:   10/01/20 0854  BP: 137/84  Pulse: 85  Resp: 20  Temp: (!) 96.1 F (35.6 C)  SpO2: 93%   Filed Weights   10/01/20 0854  Weight: 121 lb 6.4 oz (55.1 kg)    Physical Exam Constitutional:      Comments: Thin built frail appearing male patient.  No acute distress.  HEENT negative.  Accompanied by his daughter  HENT:     Head:  Normocephalic and atraumatic.     Mouth/Throat:     Pharynx: No oropharyngeal exudate.  Eyes:     Pupils: Pupils are equal, round, and reactive to light.  Cardiovascular:     Rate and Rhythm: Normal rate and regular rhythm.  Pulmonary:     Effort: No respiratory distress.     Breath sounds: No wheezing.     Comments: Decreased air entry bilaterally. Abdominal:     General: Bowel sounds are normal. There is no distension.     Palpations: Abdomen is soft. There is no mass.     Tenderness: There is no abdominal tenderness. There is no guarding or rebound.  Musculoskeletal:        General: No tenderness. Normal range of motion.     Cervical back: Normal range of motion and neck supple.  Skin:    General: Skin is warm.  Neurological:     Mental Status: He is alert and oriented to person, place, and time.  Psychiatric:        Mood and Affect: Affect normal.      LABORATORY DATA:  I have reviewed the data as listed Lab Results  Component Value Date   WBC 5.6 10/01/2020   HGB 15.0 10/01/2020   HCT 44.9 10/01/2020   MCV 93.2 10/01/2020   PLT 183 10/01/2020   Recent Labs    03/14/20 0831 04/04/20 0827 04/25/20 0834 05/16/20 0824 08/20/20 0900 09/10/20 0857 10/01/20 0839  NA 139 137 139   < > 134* 136 137  K 3.9 4.3 4.2   < > 4.0 3.9 4.1  CL 103 99 102   < > 97* 95* 97*  CO2 _0 < > _1 GLUCOSE 104* 181* 102*   < > 95 98 96  BUN _2 < > _3 CREATININE 0.56* 0.61 0.60*   < > 0.58* 0.61 0.67  CALCIUM 8.8* 8.8* 8.8*   < > 8.5* 9.0 8.9  GFRNONAA >60 >60 >60   < > >60 >60 >60  GFRAA >60 >60 >60  --   --   --   --   PROT 6.9 6.7 6.4*   < > 6.6 7.6 7.3  ALBUMIN 3.8 3.7 3.7   < > 3.7  4.1 4.0  AST _0 < > _1 ALT _2 < > _3 ALKPHOS 82 77 66   < > 58 64 69  BILITOT 0.5 0.6 0.6   < > 0.4 0.6 0.8   < > = values in this interval not displayed.    RADIOGRAPHIC STUDIES: I have personally reviewed the radiological images as  listed and agreed with the findings in the report. No results found.  ASSESSMENT & PLAN:   Cancer of upper lobe of right lung ([place]) # Stage IV metastatic non-small cell lung cancer favor adeno; mets to bone; liver.  Currently on Keytruda maintenance; JAN 19th, 2022-stable right upper lobe lesion; slight decrease in the hepatic metastatic disease; stable T3-T4 compression fractures/T1 T6 spinal fusion. STABLE.    # Proceed with [place]. Labs today reviewed;  acceptable for treatment today.   # Bone metastases- on zometa 3 mg IVPB.  Every 6 weeks. Ca-9.0;  on  ca+ vit D BID  #Malnutrition/cachexia-continue weight loss/underlying malignancy.  S/p nutrition evaluation-STABLE.   # COPD- STABLE;  Trelegy.  #Debility-spinal cord compression/status post surgery-s/p physical therapy--STABLE  # DISPOSITION: # Keytruda;   # follow up in 3 weeks - MD;labs- cbc/cmp; Keytruda;Zometa;;Dr.B     All questions were answered. The patient knows to call the clinic with any problems, questions or concerns.    Cammie Sickle, MD 10/01/2020 10:06 AM

## 2020-10-03 ENCOUNTER — Other Ambulatory Visit: Payer: Self-pay

## 2020-10-03 ENCOUNTER — Inpatient Hospital Stay (HOSPITAL_BASED_OUTPATIENT_CLINIC_OR_DEPARTMENT_OTHER): Payer: Medicare HMO | Admitting: Hospice and Palliative Medicine

## 2020-10-03 DIAGNOSIS — Z515 Encounter for palliative care: Secondary | ICD-10-CM

## 2020-10-03 DIAGNOSIS — C3410 Malignant neoplasm of upper lobe, unspecified bronchus or lung: Secondary | ICD-10-CM | POA: Diagnosis not present

## 2020-10-03 NOTE — Progress Notes (Signed)
Virtual Visit via telephone note  I connected with [name] on 10/03/20 at 10:30 AM EST by a video enabled telemedicine application and verified that I am speaking with the correct person using two identifiers.   I discussed the limitations of evaluation and management by telemedicine and the availability of in person appointments. The patient expressed understanding and agreed to proceed.  History of Present Illness: Cody [name] is a 65 y.o. 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  Patient was hospitalized from 11/29/2019 to 11/30/2019 with hypoxic respiratory failure.  He was discharged home on O2.  Patient is s/p RT/chemotherapy and is now on maintenance immunotherapy.  He was referred to palliative care to help address goals and manage ongoing symptoms.    Observations/Objective: Patient reports he is doing well.  He denies any significant changes or concerns today.  No symptomatic complaints at present.  Patient reports his functional status has improved.  He is ambulating around the house without use of assistive device.  No recent falls.  Appetite has improved and weight is up slightly.  No issues with medications nor need for refills today.  Overall, patient seems to be doing better.  We discussed the MOST form.  Patient previously completed it and brought into the clinic but I never received a copy.  He no longer has the original. Patient is in agreement to discuss and complete this during a future visit.  Assessment and Plan: Stage IV NSCLC -on maintenance immunotherapy.  Seems to be symptomatically doing well.  Recommend to discuss and complete MOST Form in the future.  Will follow  Follow Up Instructions: RTC 3 weeks   I discussed the assessment and  treatment plan with the patient. The patient was provided an opportunity to ask questions and all were answered. The patient agreed with the plan and demonstrated an understanding of the instructions.   The patient was advised to call back or seek an in-person evaluation if the symptoms worsen or if the condition fails to improve as anticipated.  I provided 5 minutes of non-face-to-face time during this encounter.   Irean Hong, NP

## 2020-10-22 ENCOUNTER — Inpatient Hospital Stay: Payer: Medicare HMO

## 2020-10-22 ENCOUNTER — Inpatient Hospital Stay: Payer: Medicare HMO | Admitting: Internal Medicine

## 2020-10-22 ENCOUNTER — Other Ambulatory Visit: Payer: Self-pay

## 2020-10-22 VITALS — BP 113/78 | HR 81 | Temp 96.0°F | Wt 120.7 lb

## 2020-10-22 DIAGNOSIS — C7951 Secondary malignant neoplasm of bone: Secondary | ICD-10-CM | POA: Diagnosis not present

## 2020-10-22 DIAGNOSIS — C3411 Malignant neoplasm of upper lobe, right bronchus or lung: Secondary | ICD-10-CM

## 2020-10-22 DIAGNOSIS — Z5112 Encounter for antineoplastic immunotherapy: Secondary | ICD-10-CM | POA: Diagnosis not present

## 2020-10-22 DIAGNOSIS — E46 Unspecified protein-calorie malnutrition: Secondary | ICD-10-CM | POA: Diagnosis not present

## 2020-10-22 DIAGNOSIS — C3490 Malignant neoplasm of unspecified part of unspecified bronchus or lung: Secondary | ICD-10-CM

## 2020-10-22 DIAGNOSIS — C3410 Malignant neoplasm of upper lobe, unspecified bronchus or lung: Secondary | ICD-10-CM

## 2020-10-22 DIAGNOSIS — M549 Dorsalgia, unspecified: Secondary | ICD-10-CM | POA: Diagnosis not present

## 2020-10-22 DIAGNOSIS — R0602 Shortness of breath: Secondary | ICD-10-CM | POA: Diagnosis not present

## 2020-10-22 DIAGNOSIS — R5383 Other fatigue: Secondary | ICD-10-CM | POA: Diagnosis not present

## 2020-10-22 DIAGNOSIS — C787 Secondary malignant neoplasm of liver and intrahepatic bile duct: Secondary | ICD-10-CM | POA: Diagnosis not present

## 2020-10-22 DIAGNOSIS — J449 Chronic obstructive pulmonary disease, unspecified: Secondary | ICD-10-CM | POA: Diagnosis not present

## 2020-10-22 DIAGNOSIS — Z7189 Other specified counseling: Secondary | ICD-10-CM

## 2020-10-22 DIAGNOSIS — G9529 Other cord compression: Secondary | ICD-10-CM | POA: Diagnosis not present

## 2020-10-22 LAB — CBC WITH DIFFERENTIAL/PLATELET
Abs Immature Granulocytes: 0.01 10*3/uL (ref 0.00–0.07)
Basophils Absolute: 0.1 10*3/uL (ref 0.0–0.1)
Basophils Relative: 1 %
Eosinophils Absolute: 0.4 10*3/uL (ref 0.0–0.5)
Eosinophils Relative: 7 %
HCT: 44.2 % (ref 39.0–52.0)
Hemoglobin: 14.7 g/dL (ref 13.0–17.0)
Immature Granulocytes: 0 %
Lymphocytes Relative: 17 %
Lymphs Abs: 0.8 10*3/uL (ref 0.7–4.0)
MCH: 31 pg (ref 26.0–34.0)
MCHC: 33.3 g/dL (ref 30.0–36.0)
MCV: 93.2 fL (ref 80.0–100.0)
Monocytes Absolute: 0.6 10*3/uL (ref 0.1–1.0)
Monocytes Relative: 13 %
Neutro Abs: 3.1 10*3/uL (ref 1.7–7.7)
Neutrophils Relative %: 62 %
Platelets: 202 10*3/uL (ref 150–400)
RBC: 4.74 MIL/uL (ref 4.22–5.81)
RDW: 13.6 % (ref 11.5–15.5)
WBC: 5 10*3/uL (ref 4.0–10.5)
nRBC: 0 % (ref 0.0–0.2)

## 2020-10-22 LAB — COMPREHENSIVE METABOLIC PANEL
ALT: 14 U/L (ref 0–44)
AST: 23 U/L (ref 15–41)
Albumin: 3.7 g/dL (ref 3.5–5.0)
Alkaline Phosphatase: 69 U/L (ref 38–126)
Anion gap: 9 (ref 5–15)
BUN: 13 mg/dL (ref 8–23)
CO2: 31 mmol/L (ref 22–32)
Calcium: 8.7 mg/dL — ABNORMAL LOW (ref 8.9–10.3)
Chloride: 97 mmol/L — ABNORMAL LOW (ref 98–111)
Creatinine, Ser: 0.59 mg/dL — ABNORMAL LOW (ref 0.61–1.24)
GFR, Estimated: >60 mL/min (ref >60)
Glucose, Bld: 98 mg/dL (ref 70–99)
Potassium: 4.1 mmol/L (ref 3.5–5.1)
Sodium: 137 mmol/L (ref 135–145)
Total Bilirubin: 1.2 mg/dL (ref 0.3–1.2)
Total Protein: 7.1 g/dL (ref 6.5–8.1)

## 2020-10-22 LAB — TSH: TSH: 7.381 u[IU]/mL — ABNORMAL HIGH (ref 0.350–4.500)

## 2020-10-22 MED ORDER — HEPARIN SOD (PORK) LOCK FLUSH 100 UNIT/ML IV SOLN
500.0000 [IU] | Freq: Once | INTRAVENOUS | Status: AC
  Administered 2020-10-22: 500 [IU] via INTRAVENOUS
  Filled 2020-10-22: qty 5

## 2020-10-22 MED ORDER — SODIUM CHLORIDE 0.9 % IV SOLN
200.0000 mg | Freq: Once | INTRAVENOUS | Status: AC
  Administered 2020-10-22: 200 mg via INTRAVENOUS
  Filled 2020-10-22: qty 8

## 2020-10-22 MED ORDER — SODIUM CHLORIDE 0.9 % IV SOLN
Freq: Once | INTRAVENOUS | Status: AC
  Filled 2020-10-22: qty 250

## 2020-10-22 MED ORDER — SODIUM CHLORIDE 0.9% FLUSH
10.0000 mL | Freq: Once | INTRAVENOUS | Status: AC
  Administered 2020-10-22: 10 mL via INTRAVENOUS
  Filled 2020-10-22: qty 10

## 2020-10-22 MED ORDER — HEPARIN SOD (PORK) LOCK FLUSH 100 UNIT/ML IV SOLN
500.0000 [IU] | Freq: Once | INTRAVENOUS | Status: DC | PRN
  Filled 2020-10-22: qty 5

## 2020-10-22 NOTE — Progress Notes (Signed)
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   # MAY 26th-Keytrda x2 cycles; [borderline PS] July 7th-carbo Alimta Keytruda cycle #1  # NGS/MOLECULAR TESTS:   # PALLIATIVE CARE EVALUATION:  # PAIN MANAGEMENT:    DIAGNOSIS:   STAGE:         ;  GOALS:  CURRENT/MOST RECENT THERAPY :     Cancer of upper lobe of right lung ([place])  11/23/2019 Initial Diagnosis   Cancer of upper lobe of right lung ([place])   12/21/2019 -  Chemotherapy    Patient is on Treatment Plan: LUNG CARBOPLATIN / PEMETREXED / PEMBROLIZUMAB Q21D INDUCTION X 4 CYCLES / MAINTENANCE PEMETREXED + PEMBROLIZUMAB       HISTORY OF PRESENTING ILLNESS:  Cody [name] 65 y.o.  male patient with metastatic non-small cell lung cancer; cord compression status post decompressive surgery; currently on Keytruda-is here for follow-up.  Patient denies any abdominal pain nausea vomiting or diarrhea.  No skin rash.  He continues to be ambulatory at home.  Continues to be on 2 L of nasal cannula oxygen.  . Review of Systems  Constitutional: Positive for malaise/fatigue. Negative for chills, diaphoresis and fever.  HENT: Negative for nosebleeds and sore throat.   Eyes: Negative for double vision.  Respiratory: Positive for  shortness of breath. Negative for cough, hemoptysis, sputum production and wheezing.   Cardiovascular: Negative for chest pain, palpitations, orthopnea and leg swelling.  Gastrointestinal: Negative for abdominal pain, blood in stool, constipation, heartburn, melena, nausea and vomiting.  Genitourinary: Negative for dysuria, frequency and urgency.  Musculoskeletal: Positive for back pain and joint pain.  Skin: Negative.  Negative for itching and rash.  Neurological: Negative for dizziness, tingling, focal weakness, weakness and headaches.  Endo/Heme/Allergies: Does not bruise/bleed easily.  Psychiatric/Behavioral: Negative for depression. The patient is not nervous/anxious and does not have insomnia.      MEDICAL HISTORY:  Past Medical History:  Diagnosis Date  . Allergy   . Cancer of upper lobe of right lung ([place]) 10/2019  . COPD (chronic obstructive pulmonary disease) ([place])   . Prostate enlargement     SURGICAL HISTORY: Past Surgical History:  Procedure Laterality Date  . HERNIA REPAIR Bilateral 1982  . HERNIA REPAIR Right 1994  . IR IMAGING GUIDED PORT INSERTION  02/24/2020  . LAMINECTOMY FOR EXCISION / EVACUATION INTRASPINAL LESION  11/07/2019   T1-T 6 at [facility]     SOCIAL HISTORY: Social History   Socioeconomic History  . Marital status: Married    Spouse name: [name]   . Number of children: 4  . Years of education: Not on file  . Highest education level: Some college, no degree  Occupational History  . Occupation: dispatch and delivery   Tobacco Use  . Smoking status: Former Smoker    Packs/day: 2.00    Years: 30.00    Pack years: 60.00  Quit date: 07/29/2011    Years since quitting: 9.2  . Smokeless tobacco: Never Used  . Tobacco comment: pt vapes  Vaping Use  . Vaping Use: Not on file  Substance and Sexual Activity  . Alcohol use: Yes    Alcohol/week: 5.0 standard drinks    Types: 5 Standard drinks or equivalent per week    Comment: 1-2/day  . Drug use: No   . Sexual activity: Not Currently    Partners: Female  Other Topics Concern  . Not on file  Social History Narrative   Worked in warehouse/ quit smoking in 2015; beer 1/day; in [place]; with wife.    Social Determinants of Health   Financial Resource Strain: Not on file  Food Insecurity: Not on file  Transportation Needs: Not on file  Physical Activity: Insufficiently Active  . Days of Exercise per Week: 7 days  . Minutes of Exercise per Session: 20 min  Stress: Not on file  Social Connections: Not on file  Intimate Partner Violence: Not on file    FAMILY HISTORY: Family History  Problem Relation Age of Onset  . Heart disease Father   . CVA Mother   . Lung cancer Sister   . Lung cancer Brother     ALLERGIES:  has No Known Allergies.  MEDICATIONS:  Current Outpatient Medications  Medication Sig Dispense Refill  . acetaminophen (TYLENOL) 500 MG tablet Take 500-1,000 mg by mouth every 6 (six) hours as needed for mild pain or fever.     Marland Kitchen albuterol (VENTOLIN HFA) 108 (90 Base) MCG/ACT inhaler Inhale 2 puffs into the lungs every 4 (four) hours as needed for wheezing or shortness of breath. 18 g 0  . Calcium Carb-Cholecalciferol (OYSTER SHELL CALCIUM) 500-400 MG-UNIT TABS Take 500 tablets by mouth.    . escitalopram (LEXAPRO) 5 MG tablet Take 1 tablet (5 mg total) by mouth daily. am's 90 tablet 1  . Fluticasone-Umeclidin-Vilant (TRELEGY ELLIPTA) 100-62.5-25 MCG/INH AEPB Inhale 1 puff into the lungs daily. 180 each 1  . lidocaine-prilocaine (EMLA) cream Apply 1 application topically as needed. Apply small amount to port site at least 1 hour prior to it being accessed, cover with plastic wrap 30 g 1  . Multiple Vitamins-Minerals (MULTIVITAMIN GUMMIES MENS PO) Take 1 Dose by mouth daily.     . ondansetron (ZOFRAN) 4 MG tablet Take 1 tablet (4 mg total) by mouth every 8 (eight) hours as needed for nausea or vomiting. 20 tablet 2  . prochlorperazine (COMPAZINE) 10 MG tablet Take 1  tablet (10 mg total) by mouth every 6 (six) hours as needed for nausea or vomiting. 40 tablet 1  . QUEtiapine (SEROQUEL) 25 MG tablet Take 1 tablet (25 mg total) by mouth at bedtime. 90 tablet 1   No current facility-administered medications for this visit.   Facility-Administered Medications Ordered in Other Visits  Medication Dose Route Frequency Provider Last Rate Last Admin  . heparin lock flush 100 unit/mL  500 Units Intracatheter Once PRN Cammie Sickle, MD          .  PHYSICAL EXAMINATION: ECOG PERFORMANCE STATUS: 3 - Symptomatic, >50% confined to bed  Vitals:   10/22/20 0916  BP: 113/78  Pulse: 81  Temp: (!) 96 F (35.6 C)   Filed Weights   10/22/20 0916  Weight: 120 lb 11.2 oz (54.7 kg)    Physical Exam Constitutional:      Comments: Thin built frail appearing male patient.  No acute distress.  HEENT negative.  Accompanied by his daughter  HENT:     Head: Normocephalic and atraumatic.     Mouth/Throat:     Pharynx: No oropharyngeal exudate.  Eyes:     Pupils: Pupils are equal, round, and reactive to light.  Cardiovascular:     Rate and Rhythm: Normal rate and regular rhythm.  Pulmonary:     Effort: No respiratory distress.     Breath sounds: No wheezing.     Comments: Decreased air entry bilaterally. Abdominal:     General: Bowel sounds are normal. There is no distension.     Palpations: Abdomen is soft. There is no mass.     Tenderness: There is no abdominal tenderness. There is no guarding or rebound.  Musculoskeletal:        General: No tenderness. Normal range of motion.     Cervical back: Normal range of motion and neck supple.  Skin:    General: Skin is warm.  Neurological:     Mental Status: He is alert and oriented to person, place, and time.  Psychiatric:        Mood and Affect: Affect normal.      LABORATORY DATA:  I have reviewed the data as listed Lab Results  Component Value Date   WBC 5.0 10/22/2020   HGB 14.7 10/22/2020    HCT 44.2 10/22/2020   MCV 93.2 10/22/2020   PLT 202 10/22/2020   Recent Labs    03/14/20 0831 04/04/20 0827 04/25/20 0834 05/16/20 0824 09/10/20 0857 10/01/20 0839 10/22/20 0846  NA 139 137 139   < > 136 137 137  K 3.9 4.3 4.2   < > 3.9 4.1 4.1  CL 103 99 102   < > 95* 97* 97*  CO2 _0 < > _1 GLUCOSE 104* 181* 102*   < > 98 96 98  BUN _2 < > _3 CREATININE 0.56* 0.61 0.60*   < > 0.61 0.67 0.59*  CALCIUM 8.8* 8.8* 8.8*   < > 9.0 8.9 8.7*  GFRNONAA >60 >60 >60   < > >60 >60 >60  GFRAA >60 >60 >60  --   --   --   --   PROT 6.9 6.7 6.4*   < > 7.6 7.3 7.1  ALBUMIN 3.8 3.7 3.7   < > 4.1 4.0 3.7  AST _4 < > _5 ALT _6 < > _7 ALKPHOS 82 77 66   < > 64 69 69  BILITOT 0.5 0.6 0.6   < > 0.6 0.8 1.2   < > = values in this interval not displayed.    RADIOGRAPHIC STUDIES: I have personally reviewed the radiological images as listed and agreed with the findings in the report. No results found.  ASSESSMENT & PLAN:   Cancer of upper lobe of right lung ([place]) # Stage IV metastatic non-small cell lung cancer favor adeno; mets to bone; liver.  Currently on Keytruda maintenance; JAN 19th, 2022-stable right upper lobe lesion; slight decrease in the hepatic metastatic disease; stable T3-T4 compression fractures/T1 T6 spinal fusion. STABLE.  # Proceed with [place]. Labs today reviewed;  acceptable for treatment today.  Discussed that would get scans 3 to 4 months interval.   # Bone metastases- on zometa 3 mg IVPB.  Every 6 weeks. Ca-8.7; HOLD zometa today on  ca+ vit D BID  #  Malnutrition/cachexia-continue weight loss/underlying malignancy.  S/p nutrition evaluation- STABLE.   # COPD- STABLE;  Trelegy.  #Debility-spinal cord compression/status post surgery-s/p physical therapy--STABLE  # DISPOSITION: # Keytruda; HOLD zometa   # follow up in 3 weeks - MD;labs- cbc/cmp; Keytruda;Zometa;;Dr.B     All questions were answered. The  patient knows to call the clinic with any problems, questions or concerns.    Cammie Sickle, MD 10/22/2020 1:51 PM

## 2020-10-22 NOTE — Assessment & Plan Note (Addendum)
#   Stage IV metastatic non-small cell lung cancer favor adeno; mets to bone; liver.  Currently on Keytruda maintenance; JAN 19th, 2022-stable right upper lobe lesion; slight decrease in the hepatic metastatic disease; stable T3-T4 compression fractures/T1 T6 spinal fusion. STABLE.  # Proceed with [place]. Labs today reviewed;  acceptable for treatment today.  Discussed that would get scans 3 to 4 months interval.   # Bone metastases- on zometa 3 mg IVPB.  Every 6 weeks. Ca-8.7; HOLD zometa today on  ca+ vit D BID  #Malnutrition/cachexia-continue weight loss/underlying malignancy.  S/p nutrition evaluation- STABLE.   # COPD- STABLE;  Trelegy.  #Debility-spinal cord compression/status post surgery-s/p physical therapy--STABLE  # DISPOSITION: # Keytruda; HOLD zometa   # follow up in 3 weeks - MD;labs- cbc/cmp; Keytruda;Zometa;;Dr.B

## 2020-10-23 ENCOUNTER — Telehealth: Payer: Self-pay | Admitting: Internal Medicine

## 2020-10-23 DIAGNOSIS — R7989 Other specified abnormal findings of blood chemistry: Secondary | ICD-10-CM

## 2020-10-23 NOTE — Telephone Encounter (Signed)
On 3/29-spoke to patient regarding slightly elevated TSH.  Will monitor for now.  T-please add thyroid profile at next visit.  GB

## 2020-10-24 NOTE — Telephone Encounter (Signed)
TSH added

## 2020-10-24 NOTE — Addendum Note (Signed)
Addended by: Delice Bison E on: 10/24/2020 09:02 AM   Modules accepted: Orders

## 2020-10-30 DIAGNOSIS — J9601 Acute respiratory failure with hypoxia: Secondary | ICD-10-CM | POA: Diagnosis not present

## 2020-10-30 DIAGNOSIS — J441 Chronic obstructive pulmonary disease with (acute) exacerbation: Secondary | ICD-10-CM | POA: Diagnosis not present

## 2020-11-12 ENCOUNTER — Other Ambulatory Visit: Payer: Self-pay

## 2020-11-12 ENCOUNTER — Inpatient Hospital Stay: Payer: Medicare HMO

## 2020-11-12 ENCOUNTER — Inpatient Hospital Stay: Payer: Medicare HMO | Attending: Internal Medicine

## 2020-11-12 ENCOUNTER — Inpatient Hospital Stay: Payer: Medicare HMO | Admitting: Internal Medicine

## 2020-11-12 DIAGNOSIS — C7951 Secondary malignant neoplasm of bone: Secondary | ICD-10-CM

## 2020-11-12 DIAGNOSIS — R5381 Other malaise: Secondary | ICD-10-CM | POA: Insufficient documentation

## 2020-11-12 DIAGNOSIS — Z79899 Other long term (current) drug therapy: Secondary | ICD-10-CM | POA: Insufficient documentation

## 2020-11-12 DIAGNOSIS — C787 Secondary malignant neoplasm of liver and intrahepatic bile duct: Secondary | ICD-10-CM | POA: Insufficient documentation

## 2020-11-12 DIAGNOSIS — M4854XA Collapsed vertebra, not elsewhere classified, thoracic region, initial encounter for fracture: Secondary | ICD-10-CM | POA: Diagnosis not present

## 2020-11-12 DIAGNOSIS — C3411 Malignant neoplasm of upper lobe, right bronchus or lung: Secondary | ICD-10-CM | POA: Diagnosis not present

## 2020-11-12 DIAGNOSIS — M549 Dorsalgia, unspecified: Secondary | ICD-10-CM | POA: Diagnosis not present

## 2020-11-12 DIAGNOSIS — Z7189 Other specified counseling: Secondary | ICD-10-CM

## 2020-11-12 DIAGNOSIS — Z87891 Personal history of nicotine dependence: Secondary | ICD-10-CM | POA: Diagnosis not present

## 2020-11-12 DIAGNOSIS — Z8249 Family history of ischemic heart disease and other diseases of the circulatory system: Secondary | ICD-10-CM | POA: Diagnosis not present

## 2020-11-12 DIAGNOSIS — R7989 Other specified abnormal findings of blood chemistry: Secondary | ICD-10-CM

## 2020-11-12 DIAGNOSIS — J449 Chronic obstructive pulmonary disease, unspecified: Secondary | ICD-10-CM | POA: Diagnosis not present

## 2020-11-12 DIAGNOSIS — R0602 Shortness of breath: Secondary | ICD-10-CM | POA: Insufficient documentation

## 2020-11-12 DIAGNOSIS — R64 Cachexia: Secondary | ICD-10-CM | POA: Insufficient documentation

## 2020-11-12 DIAGNOSIS — C3410 Malignant neoplasm of upper lobe, unspecified bronchus or lung: Secondary | ICD-10-CM

## 2020-11-12 DIAGNOSIS — Z801 Family history of malignant neoplasm of trachea, bronchus and lung: Secondary | ICD-10-CM | POA: Insufficient documentation

## 2020-11-12 DIAGNOSIS — N400 Enlarged prostate without lower urinary tract symptoms: Secondary | ICD-10-CM | POA: Diagnosis not present

## 2020-11-12 DIAGNOSIS — M2550 Pain in unspecified joint: Secondary | ICD-10-CM | POA: Insufficient documentation

## 2020-11-12 DIAGNOSIS — E039 Hypothyroidism, unspecified: Secondary | ICD-10-CM | POA: Diagnosis not present

## 2020-11-12 DIAGNOSIS — Z5112 Encounter for antineoplastic immunotherapy: Secondary | ICD-10-CM | POA: Insufficient documentation

## 2020-11-12 DIAGNOSIS — Z981 Arthrodesis status: Secondary | ICD-10-CM | POA: Diagnosis not present

## 2020-11-12 DIAGNOSIS — E46 Unspecified protein-calorie malnutrition: Secondary | ICD-10-CM | POA: Diagnosis not present

## 2020-11-12 DIAGNOSIS — R5383 Other fatigue: Secondary | ICD-10-CM | POA: Diagnosis not present

## 2020-11-12 LAB — CBC WITH DIFFERENTIAL/PLATELET
Abs Immature Granulocytes: 0.02 10*3/uL (ref 0.00–0.07)
Basophils Absolute: 0 10*3/uL (ref 0.0–0.1)
Basophils Relative: 1 %
Eosinophils Absolute: 0.5 10*3/uL (ref 0.0–0.5)
Eosinophils Relative: 9 %
HCT: 43.1 % (ref 39.0–52.0)
Hemoglobin: 14.3 g/dL (ref 13.0–17.0)
Immature Granulocytes: 0 %
Lymphocytes Relative: 16 %
Lymphs Abs: 0.9 10*3/uL (ref 0.7–4.0)
MCH: 31.2 pg (ref 26.0–34.0)
MCHC: 33.2 g/dL (ref 30.0–36.0)
MCV: 94.1 fL (ref 80.0–100.0)
Monocytes Absolute: 0.7 10*3/uL (ref 0.1–1.0)
Monocytes Relative: 13 %
Neutro Abs: 3.2 10*3/uL (ref 1.7–7.7)
Neutrophils Relative %: 61 %
Platelets: 214 10*3/uL (ref 150–400)
RBC: 4.58 MIL/uL (ref 4.22–5.81)
RDW: 13.2 % (ref 11.5–15.5)
WBC: 5.2 10*3/uL (ref 4.0–10.5)
nRBC: 0 % (ref 0.0–0.2)

## 2020-11-12 LAB — COMPREHENSIVE METABOLIC PANEL
ALT: 13 U/L (ref 0–44)
AST: 21 U/L (ref 15–41)
Albumin: 3.7 g/dL (ref 3.5–5.0)
Alkaline Phosphatase: 70 U/L (ref 38–126)
Anion gap: 7 (ref 5–15)
BUN: 14 mg/dL (ref 8–23)
CO2: 31 mmol/L (ref 22–32)
Calcium: 9 mg/dL (ref 8.9–10.3)
Chloride: 98 mmol/L (ref 98–111)
Creatinine, Ser: 0.54 mg/dL — ABNORMAL LOW (ref 0.61–1.24)
GFR, Estimated: >60 mL/min (ref >60)
Glucose, Bld: 102 mg/dL — ABNORMAL HIGH (ref 70–99)
Potassium: 4 mmol/L (ref 3.5–5.1)
Sodium: 136 mmol/L (ref 135–145)
Total Bilirubin: 0.7 mg/dL (ref 0.3–1.2)
Total Protein: 7.2 g/dL (ref 6.5–8.1)

## 2020-11-12 LAB — TSH: TSH: 6.83 u[IU]/mL — ABNORMAL HIGH (ref 0.350–4.500)

## 2020-11-12 MED ORDER — HEPARIN SOD (PORK) LOCK FLUSH 100 UNIT/ML IV SOLN
INTRAVENOUS | Status: AC
  Filled 2020-11-12: qty 5

## 2020-11-12 MED ORDER — SODIUM CHLORIDE 0.9 % IV SOLN
200.0000 mg | Freq: Once | INTRAVENOUS | Status: AC
  Administered 2020-11-12: 200 mg via INTRAVENOUS
  Filled 2020-11-12: qty 8

## 2020-11-12 MED ORDER — SODIUM CHLORIDE 0.9 % IV SOLN
Freq: Once | INTRAVENOUS | Status: AC
  Filled 2020-11-12: qty 250

## 2020-11-12 MED ORDER — ZOLEDRONIC ACID 4 MG/5ML IV CONC
3.0000 mg | Freq: Once | INTRAVENOUS | Status: AC
  Administered 2020-11-12: 3 mg via INTRAVENOUS
  Filled 2020-11-12: qty 3.75

## 2020-11-12 MED ORDER — HEPARIN SOD (PORK) LOCK FLUSH 100 UNIT/ML IV SOLN
500.0000 [IU] | Freq: Once | INTRAVENOUS | Status: AC | PRN
  Administered 2020-11-12: 500 [IU]
  Filled 2020-11-12: qty 5

## 2020-11-12 NOTE — Progress Notes (Signed)
Here for follow-up with [place] and zometa today. Reports a clear "discharge" in the corners of his eyes. Has been going on since the winter. Wants to know if OTC drops would be ok to use.

## 2020-11-12 NOTE — Progress Notes (Signed)
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   # MAY 26th-Keytrda x2 cycles; [borderline PS] July 7th-carbo Alimta Keytruda cycle #1  # NGS/MOLECULAR TESTS:   # PALLIATIVE CARE EVALUATION:  # PAIN MANAGEMENT:    DIAGNOSIS:   STAGE:         ;  GOALS:  CURRENT/MOST RECENT THERAPY :     Cancer of upper lobe of right lung (HCC)  11/23/2019 Initial Diagnosis   Cancer of upper lobe of right lung (HCC)   12/21/2019 -  Chemotherapy    Patient is on Treatment Plan: LUNG CARBOPLATIN / PEMETREXED / PEMBROLIZUMAB Q21D INDUCTION X 4 CYCLES / MAINTENANCE PEMETREXED + PEMBROLIZUMAB       HISTORY OF PRESENTING ILLNESS:  Lorne C Voorhis 64 y.o.  male patient with metastatic non-small cell lung cancer; cord compression status post decompressive surgery; currently on Keytruda-is here for follow-up.  Patient denies any nausea vomiting.  Patient denies any abdominal pain.  Patient was able to go to church; and play musical instrument.  He continues to be on nasal cannula oxygen.  Review of Systems  Constitutional: Positive for malaise/fatigue. Negative for chills, diaphoresis and fever.  HENT: Negative for nosebleeds and sore throat.   Eyes: Negative for double vision.  Respiratory: Positive  for shortness of breath. Negative for cough, hemoptysis, sputum production and wheezing.   Cardiovascular: Negative for chest pain, palpitations, orthopnea and leg swelling.  Gastrointestinal: Negative for abdominal pain, blood in stool, constipation, heartburn, melena, nausea and vomiting.  Genitourinary: Negative for dysuria, frequency and urgency.  Musculoskeletal: Positive for back pain and joint pain.  Skin: Negative.  Negative for itching and rash.  Neurological: Negative for dizziness, tingling, focal weakness, weakness and headaches.  Endo/Heme/Allergies: Does not bruise/bleed easily.  Psychiatric/Behavioral: Negative for depression. The patient is not nervous/anxious and does not have insomnia.      MEDICAL HISTORY:  Past Medical History:  Diagnosis Date  . Allergy   . Cancer of upper lobe of right lung (HCC) 10/2019  . COPD (chronic obstructive pulmonary disease) (HCC)   . Prostate enlargement     SURGICAL HISTORY: Past Surgical History:  Procedure Laterality Date  . HERNIA REPAIR Bilateral 1982  . HERNIA REPAIR Right 1994  . IR IMAGING GUIDED PORT INSERTION  02/24/2020  . LAMINECTOMY FOR EXCISION / EVACUATION INTRASPINAL LESION  11/07/2019   T1-T 6 at [facility]     SOCIAL HISTORY: Social History   Socioeconomic History  . Marital status: Married    Spouse name: [name]   . Number of children: 4  . Years of education: Not on file  . Highest education level: Some college, no degree  Occupational History  . Occupation: dispatch and delivery   Tobacco Use  . Smoking status: Former Smoker    Packs/day: 2.00    Years: 30.00    Pack years: 60.00      Quit date: 07/29/2011    Years since quitting: 9.2  . Smokeless tobacco: Never Used  . Tobacco comment: pt vapes  Vaping Use  . Vaping Use: Not on file  Substance and Sexual Activity  . Alcohol use: Yes    Alcohol/week: 5.0 standard drinks    Types: 5 Standard drinks or equivalent per week    Comment: 1-2/day  . Drug use:  No  . Sexual activity: Not Currently    Partners: Female  Other Topics Concern  . Not on file  Social History Narrative   Worked in warehouse/ quit smoking in 2015; beer 1/day; in [place]; with wife.    Social Determinants of Health   Financial Resource Strain: Not on file  Food Insecurity: Not on file  Transportation Needs: Not on file  Physical Activity: Insufficiently Active  . Days of Exercise per Week: 7 days  . Minutes of Exercise per Session: 20 min  Stress: Not on file  Social Connections: Not on file  Intimate Partner Violence: Not on file    FAMILY HISTORY: Family History  Problem Relation Age of Onset  . Heart disease Father   . CVA Mother   . Lung cancer Sister   . Lung cancer Brother     ALLERGIES:  has No Known Allergies.  MEDICATIONS:  Current Outpatient Medications  Medication Sig Dispense Refill  . acetaminophen (TYLENOL) 500 MG tablet Take 500-1,000 mg by mouth every 6 (six) hours as needed for mild pain or fever.     . albuterol (VENTOLIN HFA) 108 (90 Base) MCG/ACT inhaler Inhale 2 puffs into the lungs every 4 (four) hours as needed for wheezing or shortness of breath. 18 g 0  . Calcium Carb-Cholecalciferol (OYSTER SHELL CALCIUM) 500-400 MG-UNIT TABS Take 500 tablets by mouth.    . escitalopram (LEXAPRO) 5 MG tablet Take 1 tablet (5 mg total) by mouth daily. am's 90 tablet 1  . Fluticasone-Umeclidin-Vilant (TRELEGY ELLIPTA) 100-62.5-25 MCG/INH AEPB Inhale 1 puff into the lungs daily. 180 each 1  . lidocaine-prilocaine (EMLA) cream Apply 1 application topically as needed. Apply small amount to port site at least 1 hour prior to it being accessed, cover with plastic wrap 30 g 1  . Multiple Vitamins-Minerals (MULTIVITAMIN GUMMIES MENS PO) Take 1 Dose by mouth daily.     . QUEtiapine (SEROQUEL) 25 MG tablet Take 1 tablet (25 mg total) by mouth at bedtime. 90 tablet 1  . ondansetron (ZOFRAN) 4 MG tablet Take 1 tablet (4 mg total) by mouth every 8 (eight)  hours as needed for nausea or vomiting. (Patient not taking: Reported on 11/12/2020) 20 tablet 2  . prochlorperazine (COMPAZINE) 10 MG tablet Take 1 tablet (10 mg total) by mouth every 6 (six) hours as needed for nausea or vomiting. (Patient not taking: Reported on 11/12/2020) 40 tablet 1   No current facility-administered medications for this visit.      .  PHYSICAL EXAMINATION: ECOG PERFORMANCE STATUS: 3 - Symptomatic, >50% confined to bed  Vitals:   11/12/20 0921  BP: 116/75  Pulse: 69  Resp: 17  Temp: (!) 96.7 F (35.9 C)  SpO2: 100%   Filed Weights   11/12/20 0921  Weight: 120 lb 7 oz (54.6 kg)    Physical Exam Constitutional:      Comments: Thin built frail appearing male patient.  No acute distress.  Accompanied by his family.   HENT:     Head: Normocephalic and atraumatic.     Mouth/Throat:       Pharynx: No oropharyngeal exudate.  Eyes:     Pupils: Pupils are equal, round, and reactive to light.  Cardiovascular:     Rate and Rhythm: Normal rate and regular rhythm.  Pulmonary:     Effort: No respiratory distress.     Breath sounds: No wheezing.     Comments: Decreased air entry bilaterally. Abdominal:     General: Bowel sounds are normal. There is no distension.     Palpations: Abdomen is soft. There is no mass.     Tenderness: There is no abdominal tenderness. There is no guarding or rebound.  Musculoskeletal:        General: No tenderness. Normal range of motion.     Cervical back: Normal range of motion and neck supple.  Skin:    General: Skin is warm.  Neurological:     Mental Status: He is alert and oriented to person, place, and time.  Psychiatric:        Mood and Affect: Affect normal.      LABORATORY DATA:  I have reviewed the data as listed Lab Results  Component Value Date   WBC 5.2 11/12/2020   HGB 14.3 11/12/2020   HCT 43.1 11/12/2020   MCV 94.1 11/12/2020   PLT 214 11/12/2020   Recent Labs    03/14/20 0831 04/04/20 0827  04/25/20 0834 05/16/20 0824 10/01/20 0839 10/22/20 0846 11/12/20 0836  NA 139 137 139   < > 137 137 136  K 3.9 4.3 4.2   < > 4.1 4.1 4.0  CL 103 99 102   < > 97* 97* 98  CO2 _0 < > _1 GLUCOSE 104* 181* 102*   < > 96 98 102*  BUN _2 < > _3 CREATININE 0.56* 0.61 0.60*   < > 0.67 0.59* 0.54*  CALCIUM 8.8* 8.8* 8.8*   < > 8.9 8.7* 9.0  GFRNONAA >60 >60 >60   < > >60 >60 >60  GFRAA >60 >60 >60  --   --   --   --   PROT 6.9 6.7 6.4*   < > 7.3 7.1 7.2  ALBUMIN 3.8 3.7 3.7   < > 4.0 3.7 3.7  AST _4 < > _5 ALT _6 < > _7 ALKPHOS 82 77 66   < > 69 69 70  BILITOT 0.5 0.6 0.6   < > 0.8 1.2 0.7   < > = values in this interval not displayed.    RADIOGRAPHIC STUDIES: I have personally reviewed the radiological images as listed and agreed with the findings in the report. No results found.  ASSESSMENT & PLAN:   Cancer of upper lobe of right lung ([place]) # Stage IV metastatic non-small cell lung cancer favor adeno; mets to bone; liver.  Currently on Keytruda maintenance; JAN 19th, 2022-stable right upper lobe lesion; slight decrease in the hepatic metastatic disease; stable T3-T4 compression fractures/T1 T6 spinal fusion. STABLE.   # Proceed with [place]. Labs today reviewed;  acceptable for treatment today.  # Bone metastases- on zometa 3 mg IVPB.  Every 6 weeks. Ca-9.0 proceed with zometa today on  ca+ vit D BID  #Malnutrition/cachexia-continue weight loss/underlying malignancy.  S/p nutrition evaluation- STABLE.   # COPD- STABLE;  Trelegy.  #Debility-spinal cord compression/status post surgery-s/p physical therapy--STABLE  # sublicnial hypothyroidism- TSH slightlly elevated-7.3- await repeat  labs from today  # DISPOSITION: # Keytruda; zometa   # follow up in 3 weeks - MD;labs- cbc/cmp;THYROID PROFILE Keytruda;;Dr.B     All questions were answered. The patient knows to call the clinic with any problems, questions or  concerns.    Cammie Sickle, MD 11/12/2020 11:34 AM

## 2020-11-12 NOTE — Assessment & Plan Note (Addendum)
#   Stage IV metastatic non-small cell lung cancer favor adeno; mets to bone; liver.  Currently on Keytruda maintenance; JAN 19th, 2022-stable right upper lobe lesion; slight decrease in the hepatic metastatic disease; stable T3-T4 compression fractures/T1 T6 spinal fusion. STABLE.   # Proceed with [place]. Labs today reviewed;  acceptable for treatment today.  # Bone metastases- on zometa 3 mg IVPB.  Every 6 weeks. Ca-9.0 proceed with zometa today on  ca+ vit D BID  #Malnutrition/cachexia-continue weight loss/underlying malignancy.  S/p nutrition evaluation- STABLE.   # COPD- STABLE;  Trelegy.  #Debility-spinal cord compression/status post surgery-s/p physical therapy--STABLE  # sublicnial hypothyroidism- TSH slightlly elevated-7.3- await repeat labs from today  # DISPOSITION: # Keytruda; zometa   # follow up in 3 weeks - MD;labs- cbc/cmp;THYROID PROFILE Keytruda;;Dr.B

## 2020-11-13 ENCOUNTER — Telehealth: Payer: Self-pay | Admitting: Internal Medicine

## 2020-11-13 DIAGNOSIS — R7989 Other specified abnormal findings of blood chemistry: Secondary | ICD-10-CM

## 2020-11-13 NOTE — Telephone Encounter (Signed)
On 4/19-spoke to patient regarding abnormal TSH/overall stable from last visit on 6.    H/T-please order thyroid panel at next visit

## 2020-11-14 NOTE — Telephone Encounter (Signed)
Thyroid panel Lab added for 5/9

## 2020-11-14 NOTE — Addendum Note (Signed)
Addended by: Gloris Ham on: 11/14/2020 09:29 AM   Modules accepted: Orders

## 2020-11-29 DIAGNOSIS — J9601 Acute respiratory failure with hypoxia: Secondary | ICD-10-CM | POA: Diagnosis not present

## 2020-11-29 DIAGNOSIS — J441 Chronic obstructive pulmonary disease with (acute) exacerbation: Secondary | ICD-10-CM | POA: Diagnosis not present

## 2020-12-03 ENCOUNTER — Inpatient Hospital Stay: Payer: Medicare HMO

## 2020-12-03 ENCOUNTER — Inpatient Hospital Stay: Payer: Medicare HMO | Attending: Internal Medicine

## 2020-12-03 ENCOUNTER — Encounter: Payer: Self-pay | Admitting: Internal Medicine

## 2020-12-03 ENCOUNTER — Inpatient Hospital Stay: Payer: Medicare HMO | Admitting: Internal Medicine

## 2020-12-03 DIAGNOSIS — Z8249 Family history of ischemic heart disease and other diseases of the circulatory system: Secondary | ICD-10-CM | POA: Diagnosis not present

## 2020-12-03 DIAGNOSIS — Z5112 Encounter for antineoplastic immunotherapy: Secondary | ICD-10-CM | POA: Diagnosis not present

## 2020-12-03 DIAGNOSIS — Z801 Family history of malignant neoplasm of trachea, bronchus and lung: Secondary | ICD-10-CM | POA: Diagnosis not present

## 2020-12-03 DIAGNOSIS — R64 Cachexia: Secondary | ICD-10-CM | POA: Diagnosis not present

## 2020-12-03 DIAGNOSIS — C7951 Secondary malignant neoplasm of bone: Secondary | ICD-10-CM | POA: Diagnosis not present

## 2020-12-03 DIAGNOSIS — R0602 Shortness of breath: Secondary | ICD-10-CM | POA: Insufficient documentation

## 2020-12-03 DIAGNOSIS — C787 Secondary malignant neoplasm of liver and intrahepatic bile duct: Secondary | ICD-10-CM | POA: Diagnosis not present

## 2020-12-03 DIAGNOSIS — G9529 Other cord compression: Secondary | ICD-10-CM | POA: Diagnosis not present

## 2020-12-03 DIAGNOSIS — C3411 Malignant neoplasm of upper lobe, right bronchus or lung: Secondary | ICD-10-CM

## 2020-12-03 DIAGNOSIS — Z7289 Other problems related to lifestyle: Secondary | ICD-10-CM | POA: Insufficient documentation

## 2020-12-03 DIAGNOSIS — M549 Dorsalgia, unspecified: Secondary | ICD-10-CM | POA: Insufficient documentation

## 2020-12-03 DIAGNOSIS — Z87891 Personal history of nicotine dependence: Secondary | ICD-10-CM | POA: Insufficient documentation

## 2020-12-03 DIAGNOSIS — E039 Hypothyroidism, unspecified: Secondary | ICD-10-CM | POA: Insufficient documentation

## 2020-12-03 DIAGNOSIS — J449 Chronic obstructive pulmonary disease, unspecified: Secondary | ICD-10-CM | POA: Insufficient documentation

## 2020-12-03 DIAGNOSIS — J439 Emphysema, unspecified: Secondary | ICD-10-CM | POA: Diagnosis not present

## 2020-12-03 DIAGNOSIS — R7989 Other specified abnormal findings of blood chemistry: Secondary | ICD-10-CM

## 2020-12-03 DIAGNOSIS — R5383 Other fatigue: Secondary | ICD-10-CM | POA: Diagnosis not present

## 2020-12-03 DIAGNOSIS — Z79899 Other long term (current) drug therapy: Secondary | ICD-10-CM | POA: Diagnosis not present

## 2020-12-03 DIAGNOSIS — Z981 Arthrodesis status: Secondary | ICD-10-CM | POA: Insufficient documentation

## 2020-12-03 DIAGNOSIS — R55 Syncope and collapse: Secondary | ICD-10-CM | POA: Insufficient documentation

## 2020-12-03 DIAGNOSIS — I2510 Atherosclerotic heart disease of native coronary artery without angina pectoris: Secondary | ICD-10-CM | POA: Diagnosis not present

## 2020-12-03 DIAGNOSIS — Z7189 Other specified counseling: Secondary | ICD-10-CM

## 2020-12-03 DIAGNOSIS — N400 Enlarged prostate without lower urinary tract symptoms: Secondary | ICD-10-CM | POA: Insufficient documentation

## 2020-12-03 DIAGNOSIS — M2550 Pain in unspecified joint: Secondary | ICD-10-CM | POA: Diagnosis not present

## 2020-12-03 DIAGNOSIS — E46 Unspecified protein-calorie malnutrition: Secondary | ICD-10-CM | POA: Insufficient documentation

## 2020-12-03 DIAGNOSIS — R5381 Other malaise: Secondary | ICD-10-CM | POA: Insufficient documentation

## 2020-12-03 DIAGNOSIS — C3410 Malignant neoplasm of upper lobe, unspecified bronchus or lung: Secondary | ICD-10-CM

## 2020-12-03 LAB — CBC WITH DIFFERENTIAL/PLATELET
Abs Immature Granulocytes: 0.01 10*3/uL (ref 0.00–0.07)
Basophils Absolute: 0.1 10*3/uL (ref 0.0–0.1)
Basophils Relative: 1 %
Eosinophils Absolute: 0.5 10*3/uL (ref 0.0–0.5)
Eosinophils Relative: 9 %
HCT: 45.4 % (ref 39.0–52.0)
Hemoglobin: 15.2 g/dL (ref 13.0–17.0)
Immature Granulocytes: 0 %
Lymphocytes Relative: 15 %
Lymphs Abs: 0.9 10*3/uL (ref 0.7–4.0)
MCH: 31.2 pg (ref 26.0–34.0)
MCHC: 33.5 g/dL (ref 30.0–36.0)
MCV: 93.2 fL (ref 80.0–100.0)
Monocytes Absolute: 0.7 10*3/uL (ref 0.1–1.0)
Monocytes Relative: 12 %
Neutro Abs: 3.8 10*3/uL (ref 1.7–7.7)
Neutrophils Relative %: 63 %
Platelets: 224 10*3/uL (ref 150–400)
RBC: 4.87 MIL/uL (ref 4.22–5.81)
RDW: 12.7 % (ref 11.5–15.5)
WBC: 6 10*3/uL (ref 4.0–10.5)
nRBC: 0 % (ref 0.0–0.2)

## 2020-12-03 LAB — TSH: TSH: 6.39 u[IU]/mL — ABNORMAL HIGH (ref 0.350–4.500)

## 2020-12-03 LAB — COMPREHENSIVE METABOLIC PANEL
ALT: 11 U/L (ref 0–44)
AST: 20 U/L (ref 15–41)
Albumin: 3.6 g/dL (ref 3.5–5.0)
Alkaline Phosphatase: 74 U/L (ref 38–126)
Anion gap: 8 (ref 5–15)
BUN: 12 mg/dL (ref 8–23)
CO2: 31 mmol/L (ref 22–32)
Calcium: 9.2 mg/dL (ref 8.9–10.3)
Chloride: 96 mmol/L — ABNORMAL LOW (ref 98–111)
Creatinine, Ser: 0.56 mg/dL — ABNORMAL LOW (ref 0.61–1.24)
GFR, Estimated: >60 mL/min (ref >60)
Glucose, Bld: 116 mg/dL — ABNORMAL HIGH (ref 70–99)
Potassium: 4.3 mmol/L (ref 3.5–5.1)
Sodium: 135 mmol/L (ref 135–145)
Total Bilirubin: 0.5 mg/dL (ref 0.3–1.2)
Total Protein: 7.3 g/dL (ref 6.5–8.1)

## 2020-12-03 MED ORDER — HEPARIN SOD (PORK) LOCK FLUSH 100 UNIT/ML IV SOLN
500.0000 [IU] | Freq: Once | INTRAVENOUS | Status: AC
  Administered 2020-12-03: 500 [IU] via INTRAVENOUS
  Filled 2020-12-03: qty 5

## 2020-12-03 MED ORDER — PEMBROLIZUMAB CHEMO INJECTION 100 MG/4ML
200.0000 mg | Freq: Once | INTRAVENOUS | Status: AC
  Administered 2020-12-03: 200 mg via INTRAVENOUS
  Filled 2020-12-03: qty 8

## 2020-12-03 MED ORDER — SODIUM CHLORIDE 0.9% FLUSH
10.0000 mL | INTRAVENOUS | Status: DC | PRN
  Administered 2020-12-03: 10 mL via INTRAVENOUS
  Filled 2020-12-03: qty 10

## 2020-12-03 MED ORDER — HEPARIN SOD (PORK) LOCK FLUSH 100 UNIT/ML IV SOLN
INTRAVENOUS | Status: AC
  Filled 2020-12-03: qty 5

## 2020-12-03 MED ORDER — SODIUM CHLORIDE 0.9 % IV SOLN
Freq: Once | INTRAVENOUS | Status: AC
  Filled 2020-12-03: qty 250

## 2020-12-03 MED ORDER — HEPARIN SOD (PORK) LOCK FLUSH 100 UNIT/ML IV SOLN
500.0000 [IU] | Freq: Once | INTRAVENOUS | Status: DC | PRN
  Filled 2020-12-03: qty 5

## 2020-12-03 NOTE — Patient Instructions (Signed)
[facility]  Discharge Instructions: Thank you for choosing [facility] to provide your oncology and hematology care.  If you have a lab appointment with the [facility], please go directly to the [facility] and check in at the registration area.  Wear comfortable clothing and clothing appropriate for easy access to any Portacath or PICC line.   We strive to give you quality time with your provider. You may need to reschedule your appointment if you arrive late (15 or more minutes).  Arriving late affects you and other patients whose appointments are after yours.  Also, if you miss three or more appointments without notifying the office, you may be dismissed from the clinic at the provider's discretion.      For prescription refill requests, have your pharmacy contact our office and allow 72 hours for refills to be completed.    Today you received the following chemotherapy and/or immunotherapy agents :Keytruda      To help prevent nausea and vomiting after your treatment, we encourage you to take your nausea medication as directed.  BELOW ARE SYMPTOMS THAT SHOULD BE REPORTED IMMEDIATELY: . *FEVER GREATER THAN 100.4 F (38 C) OR HIGHER . *CHILLS OR SWEATING . *NAUSEA AND VOMITING THAT IS NOT CONTROLLED WITH YOUR NAUSEA MEDICATION . *UNUSUAL SHORTNESS OF BREATH . *UNUSUAL BRUISING OR BLEEDING . *URINARY PROBLEMS (pain or burning when urinating, or frequent urination) . *BOWEL PROBLEMS (unusual diarrhea, constipation, pain near the anus) . TENDERNESS IN MOUTH AND THROAT WITH OR WITHOUT PRESENCE OF ULCERS (sore throat, sores in mouth, or a toothache) . UNUSUAL RASH, SWELLING OR PAIN  . UNUSUAL VAGINAL DISCHARGE OR ITCHING   Items with * indicate a potential emergency and should be followed up as soon as possible or go to the Emergency Department if any problems should occur.  Please show the CHEMOTHERAPY ALERT CARD or IMMUNOTHERAPY  ALERT CARD at check-in to the Emergency Department and triage nurse.  Should you have questions after your visit or need to cancel or reschedule your appointment, please contact [facility]  [phone] and follow the prompts.  Office hours are 8:00 a.m. to 4:30 p.m. Monday - Friday. Please note that voicemails left after 4:00 p.m. may not be returned until the following business day.  We are closed weekends and major holidays. You have access to a nurse at all times for urgent questions. Please call the main number to the clinic [phone] and follow the prompts.  For any non-urgent questions, you may also contact your provider using MyChart. We now offer e-Visits for anyone 47 and older to request care online for non-urgent symptoms. For details visit [email or web address].   Also download the MyChart app! Go to the app store, search "MyChart", open the app, select [facility], and log in with your MyChart username and password.  Due to Covid, a mask is required upon entering the hospital/clinic. If you do not have a mask, one will be given to you upon arrival. For doctor visits, patients may have 1 support person aged 16 or older with them. For treatment visits, patients cannot have anyone with them due to current Covid guidelines and our immunocompromised population.   Pembrolizumab injection What is this medicine? PEMBROLIZUMAB (pem broe liz ue mab) is a monoclonal antibody. It is used to treat certain types of cancer. This medicine may be used for other purposes; ask your health care provider or pharmacist if you have questions. COMMON  BRAND NAME(S): Keytruda What should I tell my health care provider before I take this medicine? They need to know if you have any of these conditions:  autoimmune diseases like Crohn's disease, ulcerative colitis, or lupus  have had or planning to have an allogeneic stem cell transplant (uses someone else's stem  cells)  history of organ transplant  history of chest radiation  nervous system problems like myasthenia gravis or Guillain-Barre syndrome  an unusual or allergic reaction to pembrolizumab, other medicines, foods, dyes, or preservatives  pregnant or trying to get pregnant  breast-feeding How should I use this medicine? This medicine is for infusion into a vein. It is given by a health care professional in a hospital or clinic setting. A special MedGuide will be given to you before each treatment. Be sure to read this information carefully each time. Talk to your pediatrician regarding the use of this medicine in children. While this drug may be prescribed for children as young as 6 months for selected conditions, precautions do apply. Overdosage: If you think you have taken too much of this medicine contact a poison control center or emergency room at once. NOTE: This medicine is only for you. Do not share this medicine with others. What if I miss a dose? It is important not to miss your dose. Call your doctor or health care professional if you are unable to keep an appointment. What may interact with this medicine? Interactions have not been studied. This list may not describe all possible interactions. Give your health care provider a list of all the medicines, herbs, non-prescription drugs, or dietary supplements you use. Also tell them if you smoke, drink alcohol, or use illegal drugs. Some items may interact with your medicine. What should I watch for while using this medicine? Your condition will be monitored carefully while you are receiving this medicine. You may need blood work done while you are taking this medicine. Do not become pregnant while taking this medicine or for 4 months after stopping it. Women should inform their doctor if they wish to become pregnant or think they might be pregnant. There is a potential for serious side effects to an unborn child. Talk to your  health care professional or pharmacist for more information. Do not breast-feed an infant while taking this medicine or for 4 months after the last dose. What side effects may I notice from receiving this medicine? Side effects that you should report to your doctor or health care professional as soon as possible:  allergic reactions like skin rash, itching or hives, swelling of the face, lips, or tongue  bloody or black, tarry  breathing problems  changes in vision  chest pain  chills  confusion  constipation  cough  diarrhea  dizziness or feeling faint or lightheaded  fast or irregular heartbeat  fever  flushing  joint pain  low blood counts - this medicine may decrease the number of white blood cells, red blood cells and platelets. You may be at increased risk for infections and bleeding.  muscle pain  muscle weakness  pain, tingling, numbness in the hands or feet  persistent headache  redness, blistering, peeling or loosening of the skin, including inside the mouth  signs and symptoms of high blood sugar such as dizziness; dry mouth; dry skin; fruity breath; nausea; stomach pain; increased hunger or thirst; increased urination  signs and symptoms of kidney injury like trouble passing urine or change in the amount of urine  signs and  symptoms of liver injury like dark urine, light-colored stools, loss of appetite, nausea, right upper belly pain, yellowing of the eyes or skin  sweating  swollen lymph nodes  weight loss Side effects that usually do not require medical attention (report to your doctor or health care professional if they continue or are bothersome):  decreased appetite  hair loss  tiredness This list may not describe all possible side effects. Call your doctor for medical advice about side effects. You may report side effects to FDA at 1-800-FDA-1088. Where should I keep my medicine? This drug is given in a hospital or clinic and will  not be stored at home. NOTE: This sheet is a summary. It may not cover all possible information. If you have questions about this medicine, talk to your doctor, pharmacist, or health care provider.  2021 Elsevier/Gold Standard (2019-06-15 21:44:53)

## 2020-12-03 NOTE — Assessment & Plan Note (Addendum)
#   Stage IV metastatic non-small cell lung cancer favor adeno; mets to bone; liver.  Currently on Keytruda maintenance; JAN 19th, 2022-stable right upper lobe lesion; slight decrease in the hepatic metastatic disease; stable T3-T4 compression fractures/T1 T6 spinal fusion- STABLE.   # Proceed with [place]. Labs today reviewed;  acceptable for treatment today.  We will repeat a scan prior to next visit.  # Bone metastases- on zometa 3 mg IVPB.  Every 6 weeks. Ca-9.0 proceed with zometa today on  ca+ vit D BID  #Malnutrition/cachexia-continue weight loss/underlying malignancy.  S/p nutrition evaluation- STABLE   #Debility-spinal cord compression/status post surgery-s/p physical therapy--STABLE  # sublicnial hypothyroidism- TSH slightlly elevated-6.9 await repeat labs from today  # COPD- STABLE  Trelegy.  # DISPOSITION: # Keytruda;  # follow up in 3 weeks - MD;labs- cbc/cmp; Keytruda;Zometa; CT C/A/P prior-;;Dr.B

## 2020-12-03 NOTE — Progress Notes (Signed)
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   # MAY 26th-Keytrda x2 cycles; [borderline PS] July 7th-carbo Alimta Keytruda cycle #1  # NGS/MOLECULAR TESTS:   # PALLIATIVE CARE EVALUATION:  # PAIN MANAGEMENT:    DIAGNOSIS:   STAGE:         ;  GOALS:  CURRENT/MOST RECENT THERAPY :     Cancer of upper lobe of right lung ([place])  11/23/2019 Initial Diagnosis   Cancer of upper lobe of right lung ([place])   12/21/2019 -  Chemotherapy    Patient is on Treatment Plan: LUNG CARBOPLATIN / PEMETREXED / PEMBROLIZUMAB Q21D INDUCTION X 4 CYCLES / MAINTENANCE PEMETREXED + PEMBROLIZUMAB       HISTORY OF PRESENTING ILLNESS:  Cody [name] 65 y.o.  male patient with metastatic non-small cell lung cancer; cord compression status post decompressive surgery; currently on Keytruda-is here for follow-up.  Patient denies any headaches.  Denies any abdominal pain.  No nausea no vomiting.  He continues to be on nasal cannula oxygen. Review of Systems  Constitutional: Positive for malaise/fatigue. Negative for chills, diaphoresis and fever.  HENT: Negative for nosebleeds and sore throat.   Eyes: Negative for double vision.  Respiratory: Positive for shortness of breath. Negative for cough, hemoptysis,  sputum production and wheezing.   Cardiovascular: Negative for chest pain, palpitations, orthopnea and leg swelling.  Gastrointestinal: Negative for abdominal pain, blood in stool, constipation, heartburn, melena, nausea and vomiting.  Genitourinary: Negative for dysuria, frequency and urgency.  Musculoskeletal: Positive for back pain and joint pain.  Skin: Negative.  Negative for itching and rash.  Neurological: Negative for dizziness, tingling, focal weakness, weakness and headaches.  Endo/Heme/Allergies: Does not bruise/bleed easily.  Psychiatric/Behavioral: Negative for depression. The patient is not nervous/anxious and does not have insomnia.      MEDICAL HISTORY:  Past Medical History:  Diagnosis Date  . Allergy   . Cancer of upper lobe of right lung ([place]) 10/2019  . COPD (chronic obstructive pulmonary disease) ([place])   . Prostate enlargement     SURGICAL HISTORY: Past Surgical History:  Procedure Laterality Date  . HERNIA REPAIR Bilateral 1982  . HERNIA REPAIR Right 1994  . IR IMAGING GUIDED PORT INSERTION  02/24/2020  . LAMINECTOMY FOR EXCISION / EVACUATION INTRASPINAL LESION  11/07/2019   T1-T 6 at [facility]     SOCIAL HISTORY: Social History   Socioeconomic History  . Marital status: Married    Spouse name: [name]   . Number of children: 4  . Years of education: Not on file  . Highest education level: Some college, no degree  Occupational History  . Occupation: dispatch and delivery   Tobacco Use  . Smoking status: Former Smoker    Packs/day: 2.00    Years: 30.00    Pack years: 60.00    Quit date: 07/29/2011    Years since quitting: 9.3  .  Smokeless tobacco: Never Used  . Tobacco comment: pt vapes  Vaping Use  . Vaping Use: Not on file  Substance and Sexual Activity  . Alcohol use: Yes    Alcohol/week: 5.0 standard drinks    Types: 5 Standard drinks or equivalent per week    Comment: 1-2/day  . Drug use: No  . Sexual activity: Not Currently    Partners:  Female  Other Topics Concern  . Not on file  Social History Narrative   Worked in warehouse/ quit smoking in 2015; beer 1/day; in [place]; with wife.    Social Determinants of Health   Financial Resource Strain: Not on file  Food Insecurity: Not on file  Transportation Needs: Not on file  Physical Activity: Not on file  Stress: Not on file  Social Connections: Not on file  Intimate Partner Violence: Not on file    FAMILY HISTORY: Family History  Problem Relation Age of Onset  . Heart disease Father   . CVA Mother   . Lung cancer Sister   . Lung cancer Brother     ALLERGIES:  has No Known Allergies.  MEDICATIONS:  Current Outpatient Medications  Medication Sig Dispense Refill  . acetaminophen (TYLENOL) 500 MG tablet Take 500-1,000 mg by mouth every 6 (six) hours as needed for mild pain or fever.     Marland Kitchen albuterol (VENTOLIN HFA) 108 (90 Base) MCG/ACT inhaler Inhale 2 puffs into the lungs every 4 (four) hours as needed for wheezing or shortness of breath. 18 g 0  . Calcium Carb-Cholecalciferol (OYSTER SHELL CALCIUM) 500-400 MG-UNIT TABS Take 500 tablets by mouth.    . escitalopram (LEXAPRO) 5 MG tablet Take 1 tablet (5 mg total) by mouth daily. am's 90 tablet 1  . Fluticasone-Umeclidin-Vilant (TRELEGY ELLIPTA) 100-62.5-25 MCG/INH AEPB Inhale 1 puff into the lungs daily. 180 each 1  . lidocaine-prilocaine (EMLA) cream Apply 1 application topically as needed. Apply small amount to port site at least 1 hour prior to it being accessed, cover with plastic wrap 30 g 1  . Multiple Vitamins-Minerals (MULTIVITAMIN GUMMIES MENS PO) Take 1 Dose by mouth daily.     . QUEtiapine (SEROQUEL) 25 MG tablet Take 1 tablet (25 mg total) by mouth at bedtime. 90 tablet 1  . ondansetron (ZOFRAN) 4 MG tablet Take 1 tablet (4 mg total) by mouth every 8 (eight) hours as needed for nausea or vomiting. (Patient not taking: No sig reported) 20 tablet 2  . prochlorperazine (COMPAZINE) 10 MG tablet Take 1  tablet (10 mg total) by mouth every 6 (six) hours as needed for nausea or vomiting. (Patient not taking: No sig reported) 40 tablet 1   No current facility-administered medications for this visit.   Facility-Administered Medications Ordered in Other Visits  Medication Dose Route Frequency Provider Last Rate Last Admin  . heparin lock flush 100 unit/mL  500 Units Intravenous Once Charlaine Dalton R, MD      . heparin lock flush 100 unit/mL  500 Units Intracatheter Once PRN Cammie Sickle, MD      . pembrolizumab [facility]) 200 mg in sodium chloride 0.9 % 50 mL chemo infusion  200 mg Intravenous Once Charlaine Dalton R, MD      . sodium chloride flush (NS) 0.9 % injection 10 mL  10 mL Intravenous PRN Charlaine Dalton R, MD   10 mL at 12/03/20 0900      .  PHYSICAL EXAMINATION: ECOG PERFORMANCE STATUS: 3 - Symptomatic, >50% confined to bed  Vitals:  12/03/20 0908  BP: 133/89  Pulse: 76  Resp: 18  Temp: (!) 97.2 F (36.2 C)  SpO2: 99%   Filed Weights   12/03/20 0908  Weight: 118 lb (53.5 kg)    Physical Exam Constitutional:      Comments: Thin built frail appearing male patient.  No acute distress.  Accompanied by his family.   HENT:     Head: Normocephalic and atraumatic.     Mouth/Throat:     Pharynx: No oropharyngeal exudate.  Eyes:     Pupils: Pupils are equal, round, and reactive to light.  Cardiovascular:     Rate and Rhythm: Normal rate and regular rhythm.  Pulmonary:     Effort: No respiratory distress.     Breath sounds: No wheezing.     Comments: Decreased air entry bilaterally. Abdominal:     General: Bowel sounds are normal. There is no distension.     Palpations: Abdomen is soft. There is no mass.     Tenderness: There is no abdominal tenderness. There is no guarding or rebound.  Musculoskeletal:        General: No tenderness. Normal range of motion.     Cervical back: Normal range of motion and neck supple.  Skin:    General: Skin is  warm.  Neurological:     Mental Status: He is alert and oriented to person, place, and time.  Psychiatric:        Mood and Affect: Affect normal.      LABORATORY DATA:  I have reviewed the data as listed Lab Results  Component Value Date   WBC 6.0 12/03/2020   HGB 15.2 12/03/2020   HCT 45.4 12/03/2020   MCV 93.2 12/03/2020   PLT 224 12/03/2020   Recent Labs    03/14/20 0831 04/04/20 0827 04/25/20 0834 05/16/20 0824 10/22/20 0846 11/12/20 0836 12/03/20 0847  NA 139 137 139   < > 137 136 135  K 3.9 4.3 4.2   < > 4.1 4.0 4.3  CL 103 99 102   < > 97* 98 96*  CO2 _0 < > _1 GLUCOSE 104* 181* 102*   < > 98 102* 116*  BUN _2 < > _3 CREATININE 0.56* 0.61 0.60*   < > 0.59* 0.54* 0.56*  CALCIUM 8.8* 8.8* 8.8*   < > 8.7* 9.0 9.2  GFRNONAA >60 >60 >60   < > >60 >60 >60  GFRAA >60 >60 >60  --   --   --   --   PROT 6.9 6.7 6.4*   < > 7.1 7.2 7.3  ALBUMIN 3.8 3.7 3.7   < > 3.7 3.7 3.6  AST _4 < > _5 ALT _6 < > _7 ALKPHOS 82 77 66   < > 69 70 74  BILITOT 0.5 0.6 0.6   < > 1.2 0.7 0.5   < > = values in this interval not displayed.    RADIOGRAPHIC STUDIES: I have personally reviewed the radiological images as listed and agreed with the findings in the report. No results found.  ASSESSMENT & PLAN:   Cancer of upper lobe of right lung ([place]) # Stage IV metastatic non-small cell lung cancer favor adeno; mets to bone; liver.  Currently on Keytruda maintenance; JAN 19th, 2022-stable right upper lobe lesion; slight decrease in the hepatic  metastatic disease; stable T3-T4 compression fractures/T1 T6 spinal fusion- STABLE.   # Proceed with [place]. Labs today reviewed;  acceptable for treatment today.  We will repeat a scan prior to next visit.  # Bone metastases- on zometa 3 mg IVPB.  Every 6 weeks. Ca-9.0 proceed with zometa today on  ca+ vit D BID  #Malnutrition/cachexia-continue weight loss/underlying malignancy.  S/p  nutrition evaluation- STABLE   #Debility-spinal cord compression/status post surgery-s/p physical therapy--STABLE  # sublicnial hypothyroidism- TSH slightlly elevated-6.9 await repeat labs from today  # COPD- STABLE  Trelegy.  # DISPOSITION: # Keytruda;  # follow up in 3 weeks - MD;labs- cbc/cmp; Keytruda;Zometa; CT C/A/P prior-;;Dr.B     All questions were answered. The patient knows to call the clinic with any problems, questions or concerns.    Cammie Sickle, MD 12/03/2020 10:06 AM

## 2020-12-04 LAB — THYROID PANEL WITH TSH
Free Thyroxine Index: 2.3 (ref 1.2–4.9)
T3 Uptake Ratio: 30 % (ref 24–39)
T4, Total: 7.6 ug/dL (ref 4.5–12.0)
TSH: 5.69 u[IU]/mL — ABNORMAL HIGH (ref 0.450–4.500)

## 2020-12-04 NOTE — Progress Notes (Signed)
FYI -------------- Hi-your thyroid labs are still slightly abnormal.  I would recommend watching thyroid labs for now.  Hold off any treatment at this time.   GB

## 2020-12-20 ENCOUNTER — Other Ambulatory Visit: Payer: Self-pay

## 2020-12-20 ENCOUNTER — Ambulatory Visit
Admission: RE | Admit: 2020-12-20 | Discharge: 2020-12-20 | Disposition: A | Discharge status: Home / Self Care | Payer: Medicare HMO | Source: Ambulatory Visit | Attending: Internal Medicine | Admitting: Internal Medicine

## 2020-12-20 DIAGNOSIS — K7689 Other specified diseases of liver: Secondary | ICD-10-CM | POA: Diagnosis not present

## 2020-12-20 DIAGNOSIS — C3411 Malignant neoplasm of upper lobe, right bronchus or lung: Secondary | ICD-10-CM | POA: Insufficient documentation

## 2020-12-20 DIAGNOSIS — J439 Emphysema, unspecified: Secondary | ICD-10-CM | POA: Diagnosis not present

## 2020-12-20 DIAGNOSIS — C7951 Secondary malignant neoplasm of bone: Secondary | ICD-10-CM | POA: Diagnosis not present

## 2020-12-20 DIAGNOSIS — I2510 Atherosclerotic heart disease of native coronary artery without angina pectoris: Secondary | ICD-10-CM | POA: Diagnosis not present

## 2020-12-20 DIAGNOSIS — M8448XA Pathological fracture, other site, initial encounter for fracture: Secondary | ICD-10-CM | POA: Diagnosis not present

## 2020-12-20 DIAGNOSIS — K838 Other specified diseases of biliary tract: Secondary | ICD-10-CM | POA: Diagnosis not present

## 2020-12-20 MED ORDER — IOHEXOL 300 MG/ML  SOLN
80.0000 mL | Freq: Once | INTRAMUSCULAR | Status: AC | PRN
  Administered 2020-12-20: 80 mL via INTRAVENOUS

## 2020-12-25 ENCOUNTER — Encounter: Payer: Self-pay | Admitting: Internal Medicine

## 2020-12-25 ENCOUNTER — Inpatient Hospital Stay: Payer: Medicare HMO

## 2020-12-25 ENCOUNTER — Inpatient Hospital Stay: Payer: Medicare HMO | Admitting: Internal Medicine

## 2020-12-25 ENCOUNTER — Ambulatory Visit: Payer: Medicare HMO

## 2020-12-25 ENCOUNTER — Ambulatory Visit: Payer: Medicare HMO | Admitting: Internal Medicine

## 2020-12-25 ENCOUNTER — Other Ambulatory Visit: Payer: Medicare HMO

## 2020-12-25 DIAGNOSIS — Z7189 Other specified counseling: Secondary | ICD-10-CM

## 2020-12-25 DIAGNOSIS — Z5112 Encounter for antineoplastic immunotherapy: Secondary | ICD-10-CM | POA: Diagnosis not present

## 2020-12-25 DIAGNOSIS — C787 Secondary malignant neoplasm of liver and intrahepatic bile duct: Secondary | ICD-10-CM | POA: Diagnosis not present

## 2020-12-25 DIAGNOSIS — M2550 Pain in unspecified joint: Secondary | ICD-10-CM | POA: Diagnosis not present

## 2020-12-25 DIAGNOSIS — C3411 Malignant neoplasm of upper lobe, right bronchus or lung: Secondary | ICD-10-CM | POA: Diagnosis not present

## 2020-12-25 DIAGNOSIS — R0602 Shortness of breath: Secondary | ICD-10-CM | POA: Diagnosis not present

## 2020-12-25 DIAGNOSIS — G9529 Other cord compression: Secondary | ICD-10-CM | POA: Diagnosis not present

## 2020-12-25 DIAGNOSIS — C7951 Secondary malignant neoplasm of bone: Secondary | ICD-10-CM | POA: Diagnosis not present

## 2020-12-25 DIAGNOSIS — M549 Dorsalgia, unspecified: Secondary | ICD-10-CM | POA: Diagnosis not present

## 2020-12-25 DIAGNOSIS — R5383 Other fatigue: Secondary | ICD-10-CM | POA: Diagnosis not present

## 2020-12-25 DIAGNOSIS — J449 Chronic obstructive pulmonary disease, unspecified: Secondary | ICD-10-CM | POA: Diagnosis not present

## 2020-12-25 DIAGNOSIS — C3410 Malignant neoplasm of upper lobe, unspecified bronchus or lung: Secondary | ICD-10-CM

## 2020-12-25 LAB — CBC WITH DIFFERENTIAL/PLATELET
Abs Immature Granulocytes: 0.02 10*3/uL (ref 0.00–0.07)
Basophils Absolute: 0 10*3/uL (ref 0.0–0.1)
Basophils Relative: 1 %
Eosinophils Absolute: 0.3 10*3/uL (ref 0.0–0.5)
Eosinophils Relative: 5 %
HCT: 44.8 % (ref 39.0–52.0)
Hemoglobin: 14.9 g/dL (ref 13.0–17.0)
Immature Granulocytes: 0 %
Lymphocytes Relative: 14 %
Lymphs Abs: 0.9 10*3/uL (ref 0.7–4.0)
MCH: 31 pg (ref 26.0–34.0)
MCHC: 33.3 g/dL (ref 30.0–36.0)
MCV: 93.3 fL (ref 80.0–100.0)
Monocytes Absolute: 0.6 10*3/uL (ref 0.1–1.0)
Monocytes Relative: 9 %
Neutro Abs: 4.9 10*3/uL (ref 1.7–7.7)
Neutrophils Relative %: 71 %
Platelets: 239 10*3/uL (ref 150–400)
RBC: 4.8 MIL/uL (ref 4.22–5.81)
RDW: 12.1 % (ref 11.5–15.5)
WBC: 6.8 10*3/uL (ref 4.0–10.5)
nRBC: 0 % (ref 0.0–0.2)

## 2020-12-25 LAB — COMPREHENSIVE METABOLIC PANEL
ALT: 11 U/L (ref 0–44)
AST: 20 U/L (ref 15–41)
Albumin: 3.5 g/dL (ref 3.5–5.0)
Alkaline Phosphatase: 86 U/L (ref 38–126)
Anion gap: 10 (ref 5–15)
BUN: 10 mg/dL (ref 8–23)
CO2: 33 mmol/L — ABNORMAL HIGH (ref 22–32)
Calcium: 9.4 mg/dL (ref 8.9–10.3)
Chloride: 90 mmol/L — ABNORMAL LOW (ref 98–111)
Creatinine, Ser: 0.57 mg/dL — ABNORMAL LOW (ref 0.61–1.24)
GFR, Estimated: >60 mL/min (ref >60)
Glucose, Bld: 107 mg/dL — ABNORMAL HIGH (ref 70–99)
Potassium: 4.1 mmol/L (ref 3.5–5.1)
Sodium: 133 mmol/L — ABNORMAL LOW (ref 135–145)
Total Bilirubin: 0.6 mg/dL (ref 0.3–1.2)
Total Protein: 7.4 g/dL (ref 6.5–8.1)

## 2020-12-25 MED ORDER — HEPARIN SOD (PORK) LOCK FLUSH 100 UNIT/ML IV SOLN
500.0000 [IU] | Freq: Once | INTRAVENOUS | Status: AC | PRN
  Administered 2020-12-25: 500 [IU]
  Filled 2020-12-25: qty 5

## 2020-12-25 MED ORDER — SODIUM CHLORIDE 0.9 % IV SOLN
Freq: Once | INTRAVENOUS | Status: AC
Start: 2020-12-25 — End: 2020-12-25
  Filled 2020-12-25: qty 250

## 2020-12-25 MED ORDER — SODIUM CHLORIDE 0.9 % IV SOLN
200.0000 mg | Freq: Once | INTRAVENOUS | Status: AC
  Administered 2020-12-25: 200 mg via INTRAVENOUS
  Filled 2020-12-25: qty 8

## 2020-12-25 MED ORDER — HEPARIN SOD (PORK) LOCK FLUSH 100 UNIT/ML IV SOLN
INTRAVENOUS | Status: AC
  Filled 2020-12-25: qty 5

## 2020-12-25 MED ORDER — HEPARIN SOD (PORK) LOCK FLUSH 100 UNIT/ML IV SOLN
500.0000 [IU] | Freq: Once | INTRAVENOUS | Status: DC
Start: 2020-12-25 — End: 2020-12-25
  Filled 2020-12-25: qty 5

## 2020-12-25 MED ORDER — SODIUM CHLORIDE 0.9% FLUSH
10.0000 mL | INTRAVENOUS | Status: DC | PRN
  Administered 2020-12-25: 10 mL via INTRAVENOUS
  Filled 2020-12-25: qty 10

## 2020-12-25 MED ORDER — ZOLEDRONIC ACID 4 MG/5ML IV CONC
3.0000 mg | Freq: Once | INTRAVENOUS | Status: AC
  Administered 2020-12-25: 3 mg via INTRAVENOUS
  Filled 2020-12-25: qty 3.75

## 2020-12-25 NOTE — Progress Notes (Signed)
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   # MAY 26th-Keytrda x2 cycles; [borderline PS] July 7th-carbo Alimta Keytruda cycle #1  # NGS/MOLECULAR TESTS:   # PALLIATIVE CARE EVALUATION:  # PAIN MANAGEMENT:    DIAGNOSIS:   STAGE:         ;  GOALS:  CURRENT/MOST RECENT THERAPY :     Cancer of upper lobe of right lung ([place])  11/23/2019 Initial Diagnosis   Cancer of upper lobe of right lung ([place])   12/21/2019 -  Chemotherapy    Patient is on Treatment Plan: LUNG CARBOPLATIN / PEMETREXED / PEMBROLIZUMAB Q21D INDUCTION X 4 CYCLES / MAINTENANCE PEMETREXED + PEMBROLIZUMAB       HISTORY OF PRESENTING ILLNESS:  Cody [name] 65 y.o.  male patient with metastatic non-small cell lung cancer; cord compression status post decompressive surgery; currently on Keytruda-is here for follow-up/review results of the CT scan.  Patient denies any worsening shortness of breath or cough.  He continues to be nasal cannula oxygen.  No nausea no vomiting no diarrhea.   Review of Systems  Constitutional: Positive for malaise/fatigue. Negative for chills, diaphoresis and fever.  HENT: Negative for nosebleeds and sore throat.   Eyes: Negative for double vision.  Respiratory: Positive for shortness  of breath. Negative for cough, hemoptysis, sputum production and wheezing.   Cardiovascular: Negative for chest pain, palpitations, orthopnea and leg swelling.  Gastrointestinal: Negative for abdominal pain, blood in stool, constipation, heartburn, melena, nausea and vomiting.  Genitourinary: Negative for dysuria, frequency and urgency.  Musculoskeletal: Positive for back pain and joint pain.  Skin: Negative.  Negative for itching and rash.  Neurological: Negative for dizziness, tingling, focal weakness, weakness and headaches.  Endo/Heme/Allergies: Does not bruise/bleed easily.  Psychiatric/Behavioral: Negative for depression. The patient is not nervous/anxious and does not have insomnia.      MEDICAL HISTORY:  Past Medical History:  Diagnosis Date  . Allergy   . Cancer of upper lobe of right lung ([place]) 10/2019  . COPD (chronic obstructive pulmonary disease) ([place])   . Prostate enlargement     SURGICAL HISTORY: Past Surgical History:  Procedure Laterality Date  . HERNIA REPAIR Bilateral 1982  . HERNIA REPAIR Right 1994  . IR IMAGING GUIDED PORT INSERTION  02/24/2020  . LAMINECTOMY FOR EXCISION / EVACUATION INTRASPINAL LESION  11/07/2019   T1-T 6 at [facility]     SOCIAL HISTORY: Social History   Socioeconomic History  . Marital status: Married    Spouse name: [name]   . Number of children: 4  . Years of education: Not on file  . Highest education level: Some college, no degree  Occupational History  . Occupation: dispatch and delivery   Tobacco Use  . Smoking status: Former Smoker    Packs/day: 2.00    Years: 30.00    Pack years: 60.00    Quit date:  07/29/2011    Years since quitting: 9.4  . Smokeless tobacco: Never Used  . Tobacco comment: pt vapes  Vaping Use  . Vaping Use: Not on file  Substance and Sexual Activity  . Alcohol use: Yes    Alcohol/week: 5.0 standard drinks    Types: 5 Standard drinks or equivalent per week    Comment: 1-2/day  . Drug use: No  . Sexual  activity: Not Currently    Partners: Female  Other Topics Concern  . Not on file  Social History Narrative   Worked in warehouse/ quit smoking in 2015; beer 1/day; in [place]; with wife.    Social Determinants of Health   Financial Resource Strain: Not on file  Food Insecurity: Not on file  Transportation Needs: Not on file  Physical Activity: Not on file  Stress: Not on file  Social Connections: Not on file  Intimate Partner Violence: Not on file    FAMILY HISTORY: Family History  Problem Relation Age of Onset  . Heart disease Father   . CVA Mother   . Lung cancer Sister   . Lung cancer Brother     ALLERGIES:  has No Known Allergies.  MEDICATIONS:  Current Outpatient Medications  Medication Sig Dispense Refill  . acetaminophen (TYLENOL) 500 MG tablet Take 500-1,000 mg by mouth every 6 (six) hours as needed for mild pain or fever.     Marland Kitchen albuterol (VENTOLIN HFA) 108 (90 Base) MCG/ACT inhaler Inhale 2 puffs into the lungs every 4 (four) hours as needed for wheezing or shortness of breath. 18 g 0  . Calcium Carb-Cholecalciferol (OYSTER SHELL CALCIUM) 500-400 MG-UNIT TABS Take 500 tablets by mouth.    . escitalopram (LEXAPRO) 5 MG tablet Take 1 tablet (5 mg total) by mouth daily. am's 90 tablet 1  . Fluticasone-Umeclidin-Vilant (TRELEGY ELLIPTA) 100-62.5-25 MCG/INH AEPB Inhale 1 puff into the lungs daily. 180 each 1  . lidocaine-prilocaine (EMLA) cream Apply 1 application topically as needed. Apply small amount to port site at least 1 hour prior to it being accessed, cover with plastic wrap 30 g 1  . Multiple Vitamins-Minerals (MULTIVITAMIN GUMMIES MENS PO) Take 1 Dose by mouth daily.     . ondansetron (ZOFRAN) 4 MG tablet Take 1 tablet (4 mg total) by mouth every 8 (eight) hours as needed for nausea or vomiting. 20 tablet 2  . prochlorperazine (COMPAZINE) 10 MG tablet Take 1 tablet (10 mg total) by mouth every 6 (six) hours as needed for nausea or vomiting. 40 tablet 1  .  QUEtiapine (SEROQUEL) 25 MG tablet Take 1 tablet (25 mg total) by mouth at bedtime. 90 tablet 1   No current facility-administered medications for this visit.      Marland Kitchen  PHYSICAL EXAMINATION: ECOG PERFORMANCE STATUS: 3 - Symptomatic, >50% confined to bed  Vitals:   12/25/20 1412  BP: (!) 141/84  Pulse: 84  Resp: 20  Temp: (!) 97.5 F (36.4 C)   Filed Weights   12/25/20 1412  Weight: 54.1 kg    Physical Exam Constitutional:      Comments: Thin built frail appearing male patient.  No acute distress.  Accompanied by his family.   HENT:     Head: Normocephalic and atraumatic.     Mouth/Throat:     Pharynx: No oropharyngeal exudate.  Eyes:     Pupils: Pupils are equal, round, and reactive to light.  Cardiovascular:     Rate and Rhythm: Normal rate and regular rhythm.  Pulmonary:  Effort: No respiratory distress.     Breath sounds: No wheezing.     Comments: Decreased air entry bilaterally. Abdominal:     General: Bowel sounds are normal. There is no distension.     Palpations: Abdomen is soft. There is no mass.     Tenderness: There is no abdominal tenderness. There is no guarding or rebound.  Musculoskeletal:        General: No tenderness. Normal range of motion.     Cervical back: Normal range of motion and neck supple.  Skin:    General: Skin is warm.  Neurological:     Mental Status: He is alert and oriented to person, place, and time.  Psychiatric:        Mood and Affect: Affect normal.      LABORATORY DATA:  I have reviewed the data as listed Lab Results  Component Value Date   WBC 6.8 12/25/2020   HGB 14.9 12/25/2020   HCT 44.8 12/25/2020   MCV 93.3 12/25/2020   PLT 239 12/25/2020   Recent Labs    03/14/20 0831 04/04/20 0827 04/25/20 0834 05/16/20 0824 11/12/20 0836 12/03/20 0847 12/25/20 1357  NA 139 137 139   < > 136 135 133*  K 3.9 4.3 4.2   < > 4.0 4.3 4.1  CL 103 99 102   < > 98 96* 90*  CO2 _0 < > 31 31 33*  GLUCOSE 104*  181* 102*   < > 102* 116* 107*  BUN _1 < > _2 CREATININE 0.56* 0.61 0.60*   < > 0.54* 0.56* 0.57*  CALCIUM 8.8* 8.8* 8.8*   < > 9.0 9.2 9.4  GFRNONAA >60 >60 >60   < > >60 >60 >60  GFRAA >60 >60 >60  --   --   --   --   PROT 6.9 6.7 6.4*   < > 7.2 7.3 7.4  ALBUMIN 3.8 3.7 3.7   < > 3.7 3.6 3.5  AST _3 < > _4 ALT _5 < > _6 ALKPHOS 82 77 66   < > 70 74 86  BILITOT 0.5 0.6 0.6   < > 0.7 0.5 0.6   < > = values in this interval not displayed.    RADIOGRAPHIC STUDIES: I have personally reviewed the radiological images as listed and agreed with the findings in the report. CT CHEST ABDOMEN PELVIS W CONTRAST  Result Date: 12/21/2020 CLINICAL DATA:  Non-small cell lung cancer, metastatic. Assess treatment response. EXAM: CT CHEST, ABDOMEN, AND PELVIS WITH CONTRAST TECHNIQUE: Multidetector CT imaging of the chest, abdomen and pelvis was performed following the standard protocol during bolus administration of intravenous contrast. CONTRAST:  19m OMNIPAQUE IOHEXOL 300 MG/ML  SOLN COMPARISON:  August 15, 2020. FINDINGS: CT CHEST FINDINGS Cardiovascular: Calcified atheromatous plaque in the thoracic aorta, no sign of aneurysm. Normal caliber of the central pulmonary vessels. Normal heart size without pericardial effusion. Three-vessel coronary artery disease. RIGHT-sided Port-A-Cath terminates at the caval to atrial junction. Mediastinum/Nodes: Thoracic inlet structures are normal. No axillary lymphadenopathy. No mediastinal lymphadenopathy. No gross hilar lymphadenopathy. Lungs/Pleura: Pleural and parenchymal scarring in the RIGHT upper lobe is similar to the prior study. Nodular area along the anterior RIGHT upper lobe on image 38/10 measuring 1.9 x 1.5 cm is within 1 mm of its previous size. No lobar consolidation. No sign of  pleural effusion. Severe pulmonary emphysema. No new suspicious pulmonary nodule. Airways are patent. Musculoskeletal: Signs of spinal  fusion and laminectomy in the upper thoracic spine with destructive changes bridged by rod and pedicle screw fixation showing a similar appearance. Destructive changes in ribs with well-defined margins and signs of sclerosis in the RIGHT posterior upper chest with similar appearance. CT ABDOMEN PELVIS FINDINGS Hepatobiliary: Peripheral biliary duct distension extending into the RIGHT hepatic lobe with small central RIGHT hepatic lobe lesion. (Image 73/9) 1.3 x 1.1 cm central hepatic lesion previously 1.5 x 1.8 cm. Biliary duct distension is unchanged. No new hepatic lesion. No pericholecystic stranding. Pancreas: Normal, without mass, inflammation or ductal dilatation. Spleen: Spleen normal size and contour. Adrenals/Urinary Tract: Adrenal glands are normal. Symmetric renal enhancement. No sign of hydronephrosis. No suspicious renal lesion. Urinary bladder is collapsed limiting assessment. Stomach/Bowel: No acute gastrointestinal finding. Ingested oral contrast media passes throughout the small bowel and into the colon. Vascular/Lymphatic: Calcified atheromatous plaque and noncalcified plaque in the abdominal aorta. Smooth contour of the IVC. There is no gastrohepatic or hepatoduodenal ligament lymphadenopathy. No retroperitoneal or mesenteric lymphadenopathy. No pelvic sidewall lymphadenopathy. Reproductive: Heterogeneous prostate. Nonspecific on CT and unchanged. Other: 2.1 x 1.8 cm area in the RIGHT inguinal region along the margin of herniorrhaphy changes previously approximately 1.9 x 1.7 cm. No ascites. Musculoskeletal: No acute musculoskeletal process. Signs of spinal fusion extending from T1 through T6 without interval change spanning vertebral body and bony destruction at the T3 and T4 levels and pathologic fracture of T3, grossly similar loss of height without associated soft tissue or notable change from previous imaging. IMPRESSION: 1. Slight interval decrease in size of the central RIGHT hepatic lobe  lesion. 2. Stable post treatment changes in the RIGHT upper lobe. 3. No new or progressive findings. 4. Unchanged appearance of multilevel fusion spanning destructive changes and pathologic fractures at T3 and T4. 5. Stable segmental biliary duct distension 6. RIGHT inguinal nodule adjacent to herniorrhaphy changes may represent postoperative process or complex sebaceous cyst. Minimally enlarged since April of 2021 and showing no increased metabolic activity on previous PET imaging. Could consider focused ultrasound of this area would also correlate with any symptoms as this is likely amenable to direct clinical inspection/palpation. 7. Emphysema and aortic atherosclerosis. 8. Three-vessel coronary artery disease. Electronically Signed   By: Zetta Bills M.D.   On: 12/21/2020 17:09    ASSESSMENT & PLAN:   Cancer of upper lobe of right lung ([place]) # Stage IV metastatic non-small cell lung cancer favor adeno; mets to bone; liver.  Currently on Keytruda maintenance; MAY 26th, 2022- STABLE;  right upper lobe lesion; slight decrease in the hepatic metastatic disease; stable T3-T4 compression fractures/T1 T6 spinal fusion.    # Proceed with [place]. Labs today reviewed;  acceptable for treatment today.   # Bone metastases- on zometa 3 mg IVPB.  Every 6 weeks. Ca-9.0 proceed with zometa today on  ca+ vit D BID   #Debility-spinal cord compression/status post surgery-s/p physical therapy--STABLE  # sublicnial hypothyroidism- TSH slightlly elevated-6.9; but T3-T4 normal.  Monitor for now.  Discussed with patient.  # COPD- STABLE  Trelegy.  # DISPOSITION: # Keytruda; Zometa  # follow up on June 20th- MD;labs- cbc/cmp; Keytruda;;Dr.B  # I reviewed the blood work- with the patient in detail; also reviewed the imaging independently [as summarized above]; and with the patient in detail.       All questions were answered. The patient knows to call the clinic with  any problems, questions or concerns.     Cammie Sickle, MD 12/25/2020 2:50 PM

## 2020-12-25 NOTE — Assessment & Plan Note (Addendum)
#   Stage IV metastatic non-small cell lung cancer favor adeno; mets to bone; liver.  Currently on Keytruda maintenance; MAY 26th, 2022- STABLE;  right upper lobe lesion; slight decrease in the hepatic metastatic disease; stable T3-T4 compression fractures/T1 T6 spinal fusion.    # Proceed with [place]. Labs today reviewed;  acceptable for treatment today.   # Bone metastases- on zometa 3 mg IVPB.  Every 6 weeks. Ca-9.0 proceed with zometa today on  ca+ vit D BID   #Debility-spinal cord compression/status post surgery-s/p physical therapy--STABLE  # sublicnial hypothyroidism- TSH slightlly elevated-6.9; but T3-T4 normal.  Monitor for now.  Discussed with patient.  # COPD- STABLE  Trelegy.  # DISPOSITION: # Keytruda; Zometa  # follow up on June 20th- MD;labs- cbc/cmp; Keytruda;;Dr.B  # I reviewed the blood work- with the patient in detail; also reviewed the imaging independently [as summarized above]; and with the patient in detail.

## 2020-12-25 NOTE — Patient Instructions (Signed)
[facility]  Discharge Instructions: Thank you for choosing [facility] to provide your oncology and hematology care.  If you have a lab appointment with the [facility], please go directly to the [facility] and check in at the registration area.  Wear comfortable clothing and clothing appropriate for easy access to any Portacath or PICC line.   We strive to give you quality time with your provider. You may need to reschedule your appointment if you arrive late (15 or more minutes).  Arriving late affects you and other patients whose appointments are after yours.  Also, if you miss three or more appointments without notifying the office, you may be dismissed from the clinic at the provider's discretion.      For prescription refill requests, have your pharmacy contact our office and allow 72 hours for refills to be completed.    Today you received the following chemotherapy and/or immunotherapy agents: Keytruda    To help prevent nausea and vomiting after your treatment, we encourage you to take your nausea medication as directed.  BELOW ARE SYMPTOMS THAT SHOULD BE REPORTED IMMEDIATELY: . *FEVER GREATER THAN 100.4 F (38 C) OR HIGHER . *CHILLS OR SWEATING . *NAUSEA AND VOMITING THAT IS NOT CONTROLLED WITH YOUR NAUSEA MEDICATION . *UNUSUAL SHORTNESS OF BREATH . *UNUSUAL BRUISING OR BLEEDING . *URINARY PROBLEMS (pain or burning when urinating, or frequent urination) . *BOWEL PROBLEMS (unusual diarrhea, constipation, pain near the anus) . TENDERNESS IN MOUTH AND THROAT WITH OR WITHOUT PRESENCE OF ULCERS (sore throat, sores in mouth, or a toothache) . UNUSUAL RASH, SWELLING OR PAIN  . UNUSUAL VAGINAL DISCHARGE OR ITCHING   Items with * indicate a potential emergency and should be followed up as soon as possible or go to the Emergency Department if any problems should occur.  Please show the CHEMOTHERAPY ALERT CARD or IMMUNOTHERAPY ALERT  CARD at check-in to the Emergency Department and triage nurse.  Should you have questions after your visit or need to cancel or reschedule your appointment, please contact [facility]  [phone] and follow the prompts.  Office hours are 8:00 a.m. to 4:30 p.m. Monday - Friday. Please note that voicemails left after 4:00 p.m. may not be returned until the following business day.  We are closed weekends and major holidays. You have access to a nurse at all times for urgent questions. Please call the main number to the clinic [phone] and follow the prompts.  For any non-urgent questions, you may also contact your provider using MyChart. We now offer e-Visits for anyone 37 and older to request care online for non-urgent symptoms. For details visit [email or web address].   Also download the MyChart app! Go to the app store, search "MyChart", open the app, select [facility], and log in with your MyChart username and password.  Due to Covid, a mask is required upon entering the hospital/clinic. If you do not have a mask, one will be given to you upon arrival. For doctor visits, patients may have 1 support person aged 20 or older with them. For treatment visits, patients cannot have anyone with them due to current Covid guidelines and our immunocompromised population.   Zoledronic Acid Injection (Hypercalcemia, Oncology) What is this medicine? ZOLEDRONIC ACID (ZOE le dron ik AS id) slows calcium loss from bones. It high calcium levels in the blood from some kinds of cancer. It may be used in other people at risk for bone loss. This  medicine may be used for other purposes; ask your health care provider or pharmacist if you have questions. COMMON BRAND NAME(S): Zometa What should I tell my health care provider before I take this medicine? They need to know if you have any of these conditions:  cancer  dehydration  dental disease  kidney disease  liver  disease  low levels of calcium in the blood  lung or breathing disease (asthma)  receiving steroids like dexamethasone or prednisone  an unusual or allergic reaction to zoledronic acid, other medicines, foods, dyes, or preservatives  pregnant or trying to get pregnant  breast-feeding How should I use this medicine? This drug is injected into a vein. It is given by a health care provider in a hospital or clinic setting. Talk to your health care provider about the use of this drug in children. Special care may be needed. Overdosage: If you think you have taken too much of this medicine contact a poison control center or emergency room at once. NOTE: This medicine is only for you. Do not share this medicine with others. What if I miss a dose? Keep appointments for follow-up doses. It is important not to miss your dose. Call your health care provider if you are unable to keep an appointment. What may interact with this medicine?  certain antibiotics given by injection  NSAIDs, medicines for pain and inflammation, like ibuprofen or naproxen  some diuretics like bumetanide, furosemide  teriparatide  thalidomide This list may not describe all possible interactions. Give your health care provider a list of all the medicines, herbs, non-prescription drugs, or dietary supplements you use. Also tell them if you smoke, drink alcohol, or use illegal drugs. Some items may interact with your medicine. What should I watch for while using this medicine? Visit your health care provider for regular checks on your progress. It may be some time before you see the benefit from this drug. Some people who take this drug have severe bone, joint, or muscle pain. This drug may also increase your risk for jaw problems or a broken thigh bone. Tell your health care provider right away if you have severe pain in your jaw, bones, joints, or muscles. Tell you health care provider if you have any pain that does not  go away or that gets worse. Tell your dentist and dental surgeon that you are taking this drug. You should not have major dental surgery while on this drug. See your dentist to have a dental exam and fix any dental problems before starting this drug. Take good care of your teeth while on this drug. Make sure you see your dentist for regular follow-up appointments. You should make sure you get enough calcium and vitamin D while you are taking this drug. Discuss the foods you eat and the vitamins you take with your health care provider. Check with your health care provider if you have severe diarrhea, nausea, and vomiting, or if you sweat a lot. The loss of too much body fluid may make it dangerous for you to take this drug. You may need blood work done while you are taking this drug. Do not become pregnant while taking this drug. Women should inform their health care provider if they wish to become pregnant or think they might be pregnant. There is potential for serious harm to an unborn child. Talk to your health care provider for more information. What side effects may I notice from receiving this medicine? Side effects that you should  report to your doctor or health care provider as soon as possible:  allergic reactions (skin rash, itching or hives; swelling of the face, lips, or tongue)  bone pain  infection (fever, chills, cough, sore throat, pain or trouble passing urine)  jaw pain, especially after dental work  joint pain  kidney injury (trouble passing urine or change in the amount of urine)  low blood pressure (dizziness; feeling faint or lightheaded, falls; unusually weak or tired)  low calcium levels (fast heartbeat; muscle cramps or pain; pain, tingling, or numbness in the hands or feet; seizures)  low magnesium levels (fast, irregular heartbeat; muscle cramp or pain; muscle weakness; tremors; seizures)  low red blood cell counts (trouble breathing; feeling faint; lightheaded,  falls; unusually weak or tired)  muscle pain  redness, blistering, peeling, or loosening of the skin, including inside the mouth  severe diarrhea  swelling of the ankles, feet, hands  trouble breathing Side effects that usually do not require medical attention (report to your doctor or health care provider if they continue or are bothersome):  anxious  constipation  coughing  depressed mood  eye irritation, itching, or pain  fever  general ill feeling or flu-like symptoms  nausea  pain, redness, or irritation at site where injected  trouble sleeping This list may not describe all possible side effects. Call your doctor for medical advice about side effects. You may report side effects to FDA at 1-800-FDA-1088. Where should I keep my medicine? This drug is given in a hospital or clinic. It will not be stored at home. NOTE: This sheet is a summary. It may not cover all possible information. If you have questions about this medicine, talk to your doctor, pharmacist, or health care provider.  2021 [employer] Standard (2019-04-28 09:13:00)

## 2020-12-25 NOTE — Progress Notes (Signed)
Patient here for pre treatment check, he has no complaints today.

## 2020-12-30 DIAGNOSIS — J9601 Acute respiratory failure with hypoxia: Secondary | ICD-10-CM | POA: Diagnosis not present

## 2020-12-30 DIAGNOSIS — J441 Chronic obstructive pulmonary disease with (acute) exacerbation: Secondary | ICD-10-CM | POA: Diagnosis not present

## 2021-01-11 NOTE — Progress Notes (Signed)
Name: Cody [name]   MRN: [id number]    DOB: 1956-05-29   Date:01/14/2021       Progress Note  Subjective  Chief Complaint  Follow up   HPI   Depression.Anxiety: he is doing well on Seroquel for sleep takes about 30 minutes, he is doing much better with lexapro in am's, and it has really helped with his mood. He is coping well with cancer therapy. Wife is back at work, kids are still supportive, able to move more independently  around the house . Stable on medication    Lung Cancer/Emphysema/hypoxia/on oxygen: he finished radiation therapy, going finished chemo and now on  Immunotherapy Keytruda, no side effects, appetite waxes and wanes. He  is on 2-3 liters of oxygen depending on activity level.  He is no longer using a back brace - used for bone metastasis on his spine , but now able to push his own oxygen tank  . He has been using Trelegy and seems to be stable on medication.  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 He has noticed intermittent tingling on left hand , also pain when raising left arm ( at shoulder level), he asked for a steroid injection, but since he is going for cancer infusion tomorrow. He will return if needed.    Malnutrition: weight is stable, he states his appetite waxes and wanes, sometimes eats once a day and other times twice daily. His last A1C has been stable, last level at 5.8 %, glucose spikes during infusion during cancer center, he gets decadron. Reminded again importance of protein shakes.    Atherosclerosis: under palliative care at this time, we will not give statin therapy. Unchanged    BPH with elevated PSA: not  doing any further investigation because of palliative treatment for lung cancer . He states urinary urgency has improved    Senile purpura: both arms, he states stable . 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 ([place]) 11/29/2019   COPD with acute exacerbation ([place]) 11/29/2019   Cancer of upper lobe of right lung ([place]) 11/23/2019   Metastatic cancer to spine ([place]) 11/01/2019   BPH with elevated PSA 07/16/2015   COPD bronchitis 07/16/2015   Seasonal and perennial allergic rhinitis 04/24/2010    Past Surgical History:  Procedure Laterality Date   HERNIA REPAIR Bilateral 1982   HERNIA REPAIR Right 1994   IR IMAGING GUIDED PORT INSERTION  02/24/2020   LAMINECTOMY FOR EXCISION / EVACUATION INTRASPINAL LESION  11/07/2019   T1-T 6 at 47     Family History  Problem Relation Age of Onset   Heart disease Father    CVA Mother    Lung cancer Sister    Lung cancer Brother     Social History   Tobacco Use   Smoking status: Former  Packs/day: 2.00    Years: 30.00    Pack years: 60.00    Types: Cigarettes    Quit date: 07/29/2011    Years since quitting: 9.4   Smokeless tobacco: Never   Tobacco comments:    pt vapes  Substance Use Topics   Alcohol use: Yes    Alcohol/week: 5.0 standard drinks    Types: 5 Standard drinks or equivalent per week    Comment: 1-2/day     Current Outpatient Medications:    acetaminophen (TYLENOL) 500 MG tablet, Take 500-1,000 mg by mouth every 6 (six) hours as needed for mild pain or fever. , Disp: , Rfl:    albuterol (VENTOLIN HFA) 108 (90 Base) MCG/ACT inhaler, Inhale 2 puffs into the lungs every 4 (four) hours as needed for  wheezing or shortness of breath., Disp: 18 g, Rfl: 0   Calcium Carb-Cholecalciferol (OYSTER SHELL CALCIUM) 500-400 MG-UNIT TABS, Take 500 tablets by mouth., Disp: , Rfl:    escitalopram (LEXAPRO) 5 MG tablet, Take 1 tablet (5 mg total) by mouth daily. am's, Disp: 90 tablet, Rfl: 1   Fluticasone-Umeclidin-Vilant (TRELEGY ELLIPTA) 100-62.5-25 MCG/INH AEPB, Inhale 1 puff into the lungs daily., Disp: 180 each, Rfl: 1   lidocaine-prilocaine (EMLA) cream, Apply 1 application topically as needed. Apply small amount to port site at least 1 hour prior to it being accessed, cover with plastic wrap, Disp: 30 g, Rfl: 1   Multiple Vitamins-Minerals (MULTIVITAMIN GUMMIES MENS PO), Take 1 Dose by mouth daily. , Disp: , Rfl:    ondansetron (ZOFRAN) 4 MG tablet, Take 1 tablet (4 mg total) by mouth every 8 (eight) hours as needed for nausea or vomiting., Disp: 20 tablet, Rfl: 2   prochlorperazine (COMPAZINE) 10 MG tablet, Take 1 tablet (10 mg total) by mouth every 6 (six) hours as needed for nausea or vomiting., Disp: 40 tablet, Rfl: 1   QUEtiapine (SEROQUEL) 25 MG tablet, Take 1 tablet (25 mg total) by mouth at bedtime., Disp: 90 tablet, Rfl: 1  No Known Allergies  I personally reviewed active problem list, medication list, allergies, family history, social history, health maintenance with the patient/caregiver today.   ROS  Constitutional: Negative for fever or weight change.  Respiratory: Negative for cough and shortness of breath.   Cardiovascular: Negative for chest pain or palpitations.  Gastrointestinal: Negative for abdominal pain, no bowel changes.  Musculoskeletal: Negative for gait problem or joint swelling.  Skin: Negative for rash.  Neurological: Negative for dizziness or headache.  No other specific complaints in a complete review of systems (except as listed in HPI above).   Objective  Vitals:   01/14/21 1049  BP: 136/78  Pulse: 83  Resp: 16  Temp: 98 F (36.7 C)  TempSrc: Oral   SpO2: 99%  Weight: 121 lb (54.9 kg)  Height: 5\' 9"  (1.753 m)    Body mass index is 17.87 kg/m.  Physical Exam  Constitutional: Patient appears well-developed and malnourished, temporal waisting, BMI below 18  HEENT: head atraumatic, normocephalic, pupils equal and reactive to light, neck supple Cardiovascular: Normal rate, regular rhythm and normal heart sounds.  No murmur heard. No BLE edema. Pulmonary/Chest: Effort normal and breath sounds normal. No respiratory distress. Abdominal: Soft.  There is no tenderness. Psychiatric: Patient has a normal mood and affect. behavior is normal. Judgment and thought content normal.   Recent Results (from the past 2160 hour(s))  Comprehensive metabolic panel     Status: Abnormal   Collection Time: 10/22/20  8:46 AM  Result Value Ref Range   Sodium 137 135 - 145 mmol/L   Potassium 4.1 3.5 - 5.1 mmol/L   Chloride 97 (L) 98 - 111 mmol/L   CO2 31 22 - 32 mmol/L   Glucose, Bld 98 70 - 99 mg/dL    Comment: Glucose reference range applies only to samples taken after fasting for at least 8 hours.   BUN 13 8 - 23 mg/dL   Creatinine, Ser 0.59 (L) 0.61 - 1.24 mg/dL   Calcium 8.7 (L) 8.9 - 10.3 mg/dL   Total Protein 7.1 6.5 - 8.1 g/dL   Albumin 3.7 3.5 - 5.0 g/dL   AST 23 15 - 41 U/L   ALT 14 0 - 44 U/L   Alkaline Phosphatase 69 38 - 126 U/L   Total Bilirubin 1.2 0.3 - 1.2 mg/dL   GFR, Estimated >60 >60 mL/min    Comment: (NOTE) Calculated using the CKD-EPI Creatinine Equation (2021)    Anion gap 9 5 - 15    Comment: Performed at [facility], [address] [place], [place] [address]  CBC with Differential/Platelet     Status: None   Collection Time: 10/22/20  8:46 AM  Result Value Ref Range   WBC 5.0 4.0 - 10.5 K/uL   RBC 4.74 4.22 - 5.81 MIL/uL   Hemoglobin 14.7 13.0 - 17.0 g/dL   HCT 44.2 39.0 - 52.0 %   MCV 93.2 80.0 - 100.0 fL   MCH 31.0 26.0 - 34.0 pg   MCHC 33.3 30.0 - 36.0 g/dL   RDW 13.6 11.5 - 15.5 %   Platelets 202  150 - 400 K/uL   nRBC 0.0 0.0 - 0.2 %   Neutrophils Relative % 62 %   Neutro Abs 3.1 1.7 - 7.7 K/uL   Lymphocytes Relative 17 %   Lymphs Abs 0.8 0.7 - 4.0 K/uL   Monocytes Relative 13 %   Monocytes Absolute 0.6 0.1 - 1.0 K/uL   Eosinophils Relative 7 %   Eosinophils Absolute 0.4 0.0 - 0.5 K/uL   Basophils Relative 1 %   Basophils Absolute 0.1 0.0 - 0.1 K/uL   Immature Granulocytes 0 %   Abs Immature Granulocytes 0.01 0.00 - 0.07 K/uL    Comment: Performed at [facility], [address] [place], [place] [address]  TSH     Status: Abnormal   Collection Time: 10/22/20  8:46 AM  Result Value Ref Range   TSH 7.381 (H) 0.350 - 4.500 uIU/mL    Comment: Performed by a 3rd Generation assay with a functional sensitivity of <=0.01 uIU/mL. Performed at [facility], [address] [place], [place] [address]   Comprehensive metabolic panel     Status: Abnormal   Collection Time: 11/12/20  8:36 AM  Result Value Ref Range   Sodium 136 135 - 145 mmol/L   Potassium 4.0 3.5 - 5.1 mmol/L   Chloride 98 98 - 111 mmol/L   CO2 31 22 - 32 mmol/L   Glucose, Bld 102 (H) 70 - 99 mg/dL    Comment: Glucose reference range applies only to samples taken after fasting for at least 8 hours.   BUN 14 8 - 23 mg/dL   Creatinine, Ser 0.54 (L) 0.61 - 1.24 mg/dL   Calcium 9.0 8.9 - 10.3 mg/dL   Total Protein 7.2 6.5 - 8.1 g/dL   Albumin 3.7 3.5 - 5.0 g/dL   AST 21 15 - 41 U/L   ALT 13 0 -  44 U/L   Alkaline Phosphatase 70 38 - 126 U/L   Total Bilirubin 0.7 0.3 - 1.2 mg/dL   GFR, Estimated >60 >60 mL/min    Comment: (NOTE) Calculated using the CKD-EPI Creatinine Equation (2021)    Anion gap 7 5 - 15    Comment: Performed at [facility], [address] [place], [place] [address]  CBC with Differential/Platelet     Status: None   Collection Time: 11/12/20  8:36 AM  Result Value Ref Range   WBC 5.2 4.0 - 10.5 K/uL   RBC 4.58 4.22 - 5.81 MIL/uL   Hemoglobin 14.3 13.0 - 17.0 g/dL    HCT 43.1 39.0 - 52.0 %   MCV 94.1 80.0 - 100.0 fL   MCH 31.2 26.0 - 34.0 pg   MCHC 33.2 30.0 - 36.0 g/dL   RDW 13.2 11.5 - 15.5 %   Platelets 214 150 - 400 K/uL   nRBC 0.0 0.0 - 0.2 %   Neutrophils Relative % 61 %   Neutro Abs 3.2 1.7 - 7.7 K/uL   Lymphocytes Relative 16 %   Lymphs Abs 0.9 0.7 - 4.0 K/uL   Monocytes Relative 13 %   Monocytes Absolute 0.7 0.1 - 1.0 K/uL   Eosinophils Relative 9 %   Eosinophils Absolute 0.5 0.0 - 0.5 K/uL   Basophils Relative 1 %   Basophils Absolute 0.0 0.0 - 0.1 K/uL   Immature Granulocytes 0 %   Abs Immature Granulocytes 0.02 0.00 - 0.07 K/uL    Comment: Performed at [facility], [address] [place], [place] [address]  TSH     Status: Abnormal   Collection Time: 11/12/20  8:36 AM  Result Value Ref Range   TSH 6.830 (H) 0.350 - 4.500 uIU/mL    Comment: Performed by a 3rd Generation assay with a functional sensitivity of <=0.01 uIU/mL. Performed at [facility], [address] [place], [place] [address]   Thyroid Panel With TSH     Status: Abnormal   Collection Time: 12/03/20  8:47 AM  Result Value Ref Range   TSH 5.690 (H) 0.450 - 4.500 uIU/mL   T4, Total 7.6 4.5 - 12.0 ug/dL   T3 Uptake Ratio 30 24 - 39 %   Free Thyroxine Index 2.3 1.2 - 4.9    Comment: (NOTE) Performed At: [facility] [place], [place] [id number] Rush Farmer MD [id number]   TSH     Status: Abnormal   Collection Time: 12/03/20  8:47 AM  Result Value Ref Range   TSH 6.390 (H) 0.350 - 4.500 uIU/mL    Comment: Performed by a 3rd Generation assay with a functional sensitivity of <=0.01 uIU/mL. Performed at [facility], [address] [place], [place] [address]   Comprehensive metabolic panel     Status: Abnormal   Collection Time: 12/03/20  8:47 AM  Result Value Ref Range   Sodium 135 135 - 145 mmol/L   Potassium 4.3 3.5 - 5.1 mmol/L   Chloride 96 (L) 98 - 111 mmol/L   CO2 31 22 - 32 mmol/L   Glucose,  Bld 116 (H) 70 - 99 mg/dL    Comment: Glucose reference range applies only to samples taken after fasting for at least 8 hours.   BUN 12 8 - 23 mg/dL   Creatinine, Ser 0.56 (L) 0.61 - 1.24 mg/dL   Calcium 9.2 8.9 - 10.3 mg/dL   Total Protein 7.3 6.5 - 8.1 g/dL  Albumin 3.6 3.5 - 5.0 g/dL   AST 20 15 - 41 U/L   ALT 11 0 - 44 U/L   Alkaline Phosphatase 74 38 - 126 U/L   Total Bilirubin 0.5 0.3 - 1.2 mg/dL   GFR, Estimated >60 >60 mL/min    Comment: (NOTE) Calculated using the CKD-EPI Creatinine Equation (2021)    Anion gap 8 5 - 15    Comment: Performed at [facility], [address] [place], [place] [address]  CBC with Differential/Platelet     Status: None   Collection Time: 12/03/20  8:47 AM  Result Value Ref Range   WBC 6.0 4.0 - 10.5 K/uL   RBC 4.87 4.22 - 5.81 MIL/uL   Hemoglobin 15.2 13.0 - 17.0 g/dL   HCT 45.4 39.0 - 52.0 %   MCV 93.2 80.0 - 100.0 fL   MCH 31.2 26.0 - 34.0 pg   MCHC 33.5 30.0 - 36.0 g/dL   RDW 12.7 11.5 - 15.5 %   Platelets 224 150 - 400 K/uL   nRBC 0.0 0.0 - 0.2 %   Neutrophils Relative % 63 %   Neutro Abs 3.8 1.7 - 7.7 K/uL   Lymphocytes Relative 15 %   Lymphs Abs 0.9 0.7 - 4.0 K/uL   Monocytes Relative 12 %   Monocytes Absolute 0.7 0.1 - 1.0 K/uL   Eosinophils Relative 9 %   Eosinophils Absolute 0.5 0.0 - 0.5 K/uL   Basophils Relative 1 %   Basophils Absolute 0.1 0.0 - 0.1 K/uL   Immature Granulocytes 0 %   Abs Immature Granulocytes 0.01 0.00 - 0.07 K/uL    Comment: Performed at [facility], [address] [place], [place] [address]  Comprehensive metabolic panel     Status: Abnormal   Collection Time: 12/25/20  1:57 PM  Result Value Ref Range   Sodium 133 (L) 135 - 145 mmol/L   Potassium 4.1 3.5 - 5.1 mmol/L   Chloride 90 (L) 98 - 111 mmol/L   CO2 33 (H) 22 - 32 mmol/L   Glucose, Bld 107 (H) 70 - 99 mg/dL    Comment: Glucose reference range applies only to samples taken after fasting for at least 8 hours.   BUN 10 8 -  23 mg/dL   Creatinine, Ser 0.57 (L) 0.61 - 1.24 mg/dL   Calcium 9.4 8.9 - 10.3 mg/dL   Total Protein 7.4 6.5 - 8.1 g/dL   Albumin 3.5 3.5 - 5.0 g/dL   AST 20 15 - 41 U/L   ALT 11 0 - 44 U/L   Alkaline Phosphatase 86 38 - 126 U/L   Total Bilirubin 0.6 0.3 - 1.2 mg/dL   GFR, Estimated >60 >60 mL/min    Comment: (NOTE) Calculated using the CKD-EPI Creatinine Equation (2021)    Anion gap 10 5 - 15    Comment: Performed at [facility], [address] [place], [place] [address]  CBC with Differential/Platelet     Status: None   Collection Time: 12/25/20  1:57 PM  Result Value Ref Range   WBC 6.8 4.0 - 10.5 K/uL   RBC 4.80 4.22 - 5.81 MIL/uL   Hemoglobin 14.9 13.0 - 17.0 g/dL   HCT 44.8 39.0 - 52.0 %   MCV 93.3 80.0 - 100.0 fL   MCH 31.0 26.0 - 34.0 pg   MCHC 33.3 30.0 - 36.0 g/dL   RDW 12.1 11.5 - 15.5 %   Platelets 239 150 - 400 K/uL  nRBC 0.0 0.0 - 0.2 %   Neutrophils Relative % 71 %   Neutro Abs 4.9 1.7 - 7.7 K/uL   Lymphocytes Relative 14 %   Lymphs Abs 0.9 0.7 - 4.0 K/uL   Monocytes Relative 9 %   Monocytes Absolute 0.6 0.1 - 1.0 K/uL   Eosinophils Relative 5 %   Eosinophils Absolute 0.3 0.0 - 0.5 K/uL   Basophils Relative 1 %   Basophils Absolute 0.0 0.0 - 0.1 K/uL   Immature Granulocytes 0 %   Abs Immature Granulocytes 0.02 0.00 - 0.07 K/uL    Comment: Performed at [facility], [address] [place], [place] [address]     PHQ2/9: Depression screen [facility] 2/9 01/14/2021 08/27/2020 07/10/2020 04/10/2020 01/05/2020  Decreased Interest 0 0 0 0 0  Down, Depressed, Hopeless 1 0 0 0 0  PHQ - 2 Score 1 0 0 0 0  Altered sleeping - - 1 0 1  Tired, decreased energy 1 - 1 0 0  Change in appetite 0 - 1 0 0  Feeling bad or failure about yourself  1 - 0 0 0  Trouble concentrating 0 - 0 0 0  Moving slowly or fidgety/restless 0 - 0 0 0  Suicidal thoughts 0 - 0 0 0  PHQ-9 Score - - 3 0 1  Difficult doing work/chores - - Not difficult at all - Not difficult at all     phq 9 is negative   Fall Risk: Fall Risk  01/14/2021 08/27/2020 07/10/2020 04/10/2020 01/05/2020  Falls in the past year? 0 0 0 0 0  Number falls in past yr: 0 0 0 0 -  Injury with Fall? 0 0 0 0 -  Risk for fall due to : - - Impaired mobility - Impaired mobility;Impaired balance/gait     Functional Status Survey: Is the patient deaf or have difficulty hearing?: No Does the patient have difficulty seeing, even when wearing glasses/contacts?: No Does the patient have difficulty concentrating, remembering, or making decisions?: No Does the patient have difficulty walking or climbing stairs?: Yes Does the patient have difficulty dressing or bathing?: Yes Does the patient have difficulty doing errands alone such as visiting a doctor's office or shopping?: Yes    Assessment & Plan  1. Moderate protein-calorie malnutrition ([place])  Discussed protein   2. Atherosclerosis of aorta [facility])  Not on statin on palliative care  3. Metastatic cancer to spine [facility])  Doing well   4. Centrilobular emphysema ([place])  - Fluticasone-Umeclidin-Vilant (TRELEGY ELLIPTA) 100-62.5-25 MCG/INH AEPB; Inhale 1 puff into the lungs daily.  Dispense: 180 each; Refill: 1  5. Senile purpura ([place])  Reassurance given  6. BPH with elevated PSA  Not interested in getting it checked  7. Dysthymia  - escitalopram (LEXAPRO) 5 MG tablet; Take 1 tablet (5 mg total) by mouth daily. am's  Dispense: 90 tablet; Refill: 1  8. Anxiety  - escitalopram (LEXAPRO) 5 MG tablet; Take 1 tablet (5 mg total) by mouth daily. am's  Dispense: 90 tablet; Refill: 1  9. Centrilobular emphysema ([place])  - Fluticasone-Umeclidin-Vilant (TRELEGY ELLIPTA) 100-62.5-25 MCG/INH AEPB; Inhale 1 puff into the lungs daily.  Dispense: 180 each; Refill: 1  10. Insomnia, unspecified type  - QUEtiapine (SEROQUEL) 25 MG tablet; Take 1 tablet (25 mg total) by mouth at bedtime.  Dispense: 90 tablet; Refill: 1   11. Acute pain of left  shoulder  He will return for steroid injection if okay by hematologist/oncologist   12. Left  hand paresthesia  Intermittent now, likely from shoulder but discussed spinal cord compression symptoms

## 2021-01-14 ENCOUNTER — Encounter: Payer: Self-pay | Admitting: Family Medicine

## 2021-01-14 ENCOUNTER — Ambulatory Visit (INDEPENDENT_AMBULATORY_CARE_PROVIDER_SITE_OTHER): Payer: Medicare HMO | Admitting: Family Medicine

## 2021-01-14 ENCOUNTER — Other Ambulatory Visit: Payer: Self-pay

## 2021-01-14 VITALS — BP 136/78 | HR 83 | Temp 98.0°F | Resp 16 | Ht 69.0 in | Wt 121.0 lb

## 2021-01-14 DIAGNOSIS — C7951 Secondary malignant neoplasm of bone: Secondary | ICD-10-CM

## 2021-01-14 DIAGNOSIS — G4700 Insomnia, unspecified: Secondary | ICD-10-CM

## 2021-01-14 DIAGNOSIS — N400 Enlarged prostate without lower urinary tract symptoms: Secondary | ICD-10-CM | POA: Diagnosis not present

## 2021-01-14 DIAGNOSIS — R202 Paresthesia of skin: Secondary | ICD-10-CM

## 2021-01-14 DIAGNOSIS — R972 Elevated prostate specific antigen [PSA]: Secondary | ICD-10-CM

## 2021-01-14 DIAGNOSIS — F341 Dysthymic disorder: Secondary | ICD-10-CM

## 2021-01-14 DIAGNOSIS — I700 Atherosclerosis of aorta: Secondary | ICD-10-CM | POA: Diagnosis not present

## 2021-01-14 DIAGNOSIS — E440 Moderate protein-calorie malnutrition: Secondary | ICD-10-CM

## 2021-01-14 DIAGNOSIS — M25512 Pain in left shoulder: Secondary | ICD-10-CM

## 2021-01-14 DIAGNOSIS — D692 Other nonthrombocytopenic purpura: Secondary | ICD-10-CM

## 2021-01-14 DIAGNOSIS — E43 Unspecified severe protein-calorie malnutrition: Secondary | ICD-10-CM | POA: Insufficient documentation

## 2021-01-14 DIAGNOSIS — J432 Centrilobular emphysema: Secondary | ICD-10-CM

## 2021-01-14 DIAGNOSIS — F419 Anxiety disorder, unspecified: Secondary | ICD-10-CM

## 2021-01-14 DIAGNOSIS — R69 Illness, unspecified: Secondary | ICD-10-CM | POA: Diagnosis not present

## 2021-01-14 MED ORDER — TRELEGY ELLIPTA 100-62.5-25 MCG/INH IN AEPB
1.0000 | INHALATION_SPRAY | Freq: Every day | RESPIRATORY_TRACT | 1 refills | Status: DC
Start: 2021-01-14 — End: 2022-06-11

## 2021-01-14 MED ORDER — ESCITALOPRAM OXALATE 5 MG PO TABS: 5.0000 mg | ORAL_TABLET | Freq: Every day | ORAL | 1 refills | Status: DC

## 2021-01-14 MED ORDER — QUETIAPINE FUMARATE 25 MG PO TABS: 25.0000 mg | ORAL_TABLET | Freq: Every day | ORAL | 1 refills | Status: DC

## 2021-01-15 ENCOUNTER — Other Ambulatory Visit: Payer: Self-pay | Admitting: *Deleted

## 2021-01-15 ENCOUNTER — Inpatient Hospital Stay: Payer: Medicare HMO | Attending: Internal Medicine | Admitting: Internal Medicine

## 2021-01-15 ENCOUNTER — Inpatient Hospital Stay: Payer: Medicare HMO

## 2021-01-15 DIAGNOSIS — C3411 Malignant neoplasm of upper lobe, right bronchus or lung: Secondary | ICD-10-CM | POA: Diagnosis not present

## 2021-01-15 DIAGNOSIS — I2510 Atherosclerotic heart disease of native coronary artery without angina pectoris: Secondary | ICD-10-CM | POA: Diagnosis not present

## 2021-01-15 DIAGNOSIS — R0602 Shortness of breath: Secondary | ICD-10-CM | POA: Insufficient documentation

## 2021-01-15 DIAGNOSIS — Z79899 Other long term (current) drug therapy: Secondary | ICD-10-CM | POA: Diagnosis not present

## 2021-01-15 DIAGNOSIS — Z5112 Encounter for antineoplastic immunotherapy: Secondary | ICD-10-CM | POA: Diagnosis not present

## 2021-01-15 DIAGNOSIS — C787 Secondary malignant neoplasm of liver and intrahepatic bile duct: Secondary | ICD-10-CM | POA: Insufficient documentation

## 2021-01-15 DIAGNOSIS — Z7289 Other problems related to lifestyle: Secondary | ICD-10-CM | POA: Diagnosis not present

## 2021-01-15 DIAGNOSIS — M2550 Pain in unspecified joint: Secondary | ICD-10-CM | POA: Insufficient documentation

## 2021-01-15 DIAGNOSIS — R55 Syncope and collapse: Secondary | ICD-10-CM | POA: Diagnosis not present

## 2021-01-15 DIAGNOSIS — Z8249 Family history of ischemic heart disease and other diseases of the circulatory system: Secondary | ICD-10-CM | POA: Insufficient documentation

## 2021-01-15 DIAGNOSIS — Z801 Family history of malignant neoplasm of trachea, bronchus and lung: Secondary | ICD-10-CM | POA: Insufficient documentation

## 2021-01-15 DIAGNOSIS — J449 Chronic obstructive pulmonary disease, unspecified: Secondary | ICD-10-CM | POA: Insufficient documentation

## 2021-01-15 DIAGNOSIS — C7951 Secondary malignant neoplasm of bone: Secondary | ICD-10-CM | POA: Insufficient documentation

## 2021-01-15 DIAGNOSIS — N400 Enlarged prostate without lower urinary tract symptoms: Secondary | ICD-10-CM | POA: Diagnosis not present

## 2021-01-15 DIAGNOSIS — M25512 Pain in left shoulder: Secondary | ICD-10-CM | POA: Insufficient documentation

## 2021-01-15 DIAGNOSIS — E039 Hypothyroidism, unspecified: Secondary | ICD-10-CM | POA: Insufficient documentation

## 2021-01-15 DIAGNOSIS — J439 Emphysema, unspecified: Secondary | ICD-10-CM | POA: Insufficient documentation

## 2021-01-15 DIAGNOSIS — R5383 Other fatigue: Secondary | ICD-10-CM | POA: Diagnosis not present

## 2021-01-15 DIAGNOSIS — M4854XA Collapsed vertebra, not elsewhere classified, thoracic region, initial encounter for fracture: Secondary | ICD-10-CM | POA: Insufficient documentation

## 2021-01-15 DIAGNOSIS — Z7189 Other specified counseling: Secondary | ICD-10-CM

## 2021-01-15 DIAGNOSIS — Z87891 Personal history of nicotine dependence: Secondary | ICD-10-CM | POA: Diagnosis not present

## 2021-01-15 DIAGNOSIS — M549 Dorsalgia, unspecified: Secondary | ICD-10-CM | POA: Insufficient documentation

## 2021-01-15 LAB — COMPREHENSIVE METABOLIC PANEL
ALT: 11 U/L (ref 0–44)
AST: 19 U/L (ref 15–41)
Albumin: 3.5 g/dL (ref 3.5–5.0)
Alkaline Phosphatase: 76 U/L (ref 38–126)
Anion gap: 9 (ref 5–15)
BUN: 10 mg/dL (ref 8–23)
CO2: 37 mmol/L — ABNORMAL HIGH (ref 22–32)
Calcium: 8.9 mg/dL (ref 8.9–10.3)
Chloride: 90 mmol/L — ABNORMAL LOW (ref 98–111)
Creatinine, Ser: 0.53 mg/dL — ABNORMAL LOW (ref 0.61–1.24)
GFR, Estimated: >60 mL/min (ref >60)
Glucose, Bld: 101 mg/dL — ABNORMAL HIGH (ref 70–99)
Potassium: 4 mmol/L (ref 3.5–5.1)
Sodium: 136 mmol/L (ref 135–145)
Total Bilirubin: 0.6 mg/dL (ref 0.3–1.2)
Total Protein: 7.4 g/dL (ref 6.5–8.1)

## 2021-01-15 LAB — CBC WITH DIFFERENTIAL/PLATELET
Abs Immature Granulocytes: 0.01 10*3/uL (ref 0.00–0.07)
Basophils Absolute: 0.1 10*3/uL (ref 0.0–0.1)
Basophils Relative: 1 %
Eosinophils Absolute: 0.4 10*3/uL (ref 0.0–0.5)
Eosinophils Relative: 7 %
HCT: 43.6 % (ref 39.0–52.0)
Hemoglobin: 14.5 g/dL (ref 13.0–17.0)
Immature Granulocytes: 0 %
Lymphocytes Relative: 15 %
Lymphs Abs: 0.9 10*3/uL (ref 0.7–4.0)
MCH: 31.2 pg (ref 26.0–34.0)
MCHC: 33.3 g/dL (ref 30.0–36.0)
MCV: 93.8 fL (ref 80.0–100.0)
Monocytes Absolute: 0.7 10*3/uL (ref 0.1–1.0)
Monocytes Relative: 12 %
Neutro Abs: 4 10*3/uL (ref 1.7–7.7)
Neutrophils Relative %: 65 %
Platelets: 234 10*3/uL (ref 150–400)
RBC: 4.65 MIL/uL (ref 4.22–5.81)
RDW: 11.9 % (ref 11.5–15.5)
WBC: 6.1 10*3/uL (ref 4.0–10.5)
nRBC: 0 % (ref 0.0–0.2)

## 2021-01-15 MED ORDER — HEPARIN SOD (PORK) LOCK FLUSH 100 UNIT/ML IV SOLN
INTRAVENOUS | Status: AC
  Filled 2021-01-15: qty 5

## 2021-01-15 MED ORDER — HEPARIN SOD (PORK) LOCK FLUSH 100 UNIT/ML IV SOLN
500.0000 [IU] | Freq: Once | INTRAVENOUS | Status: AC | PRN
  Administered 2021-01-15: 500 [IU]
  Filled 2021-01-15: qty 5

## 2021-01-15 MED ORDER — SODIUM CHLORIDE 0.9 % IV SOLN
Freq: Once | INTRAVENOUS | Status: AC
  Filled 2021-01-15: qty 250

## 2021-01-15 MED ORDER — SODIUM CHLORIDE 0.9 % IV SOLN
200.0000 mg | Freq: Once | INTRAVENOUS | Status: AC
  Administered 2021-01-15: 200 mg via INTRAVENOUS
  Filled 2021-01-15: qty 8

## 2021-01-15 MED ORDER — HEPARIN SOD (PORK) LOCK FLUSH 100 UNIT/ML IV SOLN
500.0000 [IU] | Freq: Once | INTRAVENOUS | Status: AC
  Filled 2021-01-15: qty 5

## 2021-01-15 MED ORDER — SODIUM CHLORIDE 0.9% FLUSH
10.0000 mL | INTRAVENOUS | Status: DC | PRN
  Administered 2021-01-15: 10 mL via INTRAVENOUS
  Filled 2021-01-15: qty 10

## 2021-01-15 NOTE — Progress Notes (Signed)
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   # MAY 26th-Keytrda x2 cycles; [borderline PS] July 7th-carbo Alimta Keytruda cycle #1  # NGS/MOLECULAR TESTS:   # PALLIATIVE CARE EVALUATION:  # PAIN MANAGEMENT:    DIAGNOSIS:   STAGE:         ;  GOALS:  CURRENT/MOST RECENT THERAPY :     Cancer of upper lobe of right lung ([place])  11/23/2019 Initial Diagnosis   Cancer of upper lobe of right lung ([place])   12/21/2019 -  Chemotherapy    Patient is on Treatment Plan: LUNG CARBOPLATIN / PEMETREXED / PEMBROLIZUMAB Q21D INDUCTION X 4 CYCLES / MAINTENANCE PEMETREXED + PEMBROLIZUMAB        HISTORY OF PRESENTING ILLNESS:  Cody [name] 65 y.o.  male patient with metastatic non-small cell lung cancer; cord compression status post decompressive surgery; currently on Keytruda-is here for follow-up.  Patient notes to have pain in his left shoulder joint.  Also noted to have phlebitis of the left arm post IV injection 3 weeks ago.   Denies any worsening shortness of breath or cough.  Continues plan chronic nasal oxygen.  No nausea no vomiting or diarrhea.    Review of Systems  Constitutional:  Positive for malaise/fatigue. Negative for chills, diaphoresis and fever.  HENT:  Negative for  nosebleeds and sore throat.   Eyes:  Negative for double vision.  Respiratory:  Positive for shortness of breath. Negative for cough, hemoptysis, sputum production and wheezing.   Cardiovascular:  Negative for chest pain, palpitations, orthopnea and leg swelling.  Gastrointestinal:  Negative for abdominal pain, blood in stool, constipation, heartburn, melena, nausea and vomiting.  Genitourinary:  Negative for dysuria, frequency and urgency.  Musculoskeletal:  Positive for back pain and joint pain.  Skin: Negative.  Negative for itching and rash.  Neurological:  Negative for dizziness, tingling, focal weakness, weakness and headaches.  Endo/Heme/Allergies:  Does not bruise/bleed easily.  Psychiatric/Behavioral:  Negative for depression. The patient is not nervous/anxious and does not have insomnia.     MEDICAL HISTORY:  Past Medical History:  Diagnosis Date   Allergy    Cancer of upper lobe of right lung ([place]) 10/2019   COPD (chronic obstructive pulmonary disease) (HCC)    Prostate enlargement     SURGICAL HISTORY: Past Surgical History:  Procedure Laterality Date   HERNIA REPAIR Bilateral 1982   HERNIA REPAIR Right 1994   IR IMAGING GUIDED PORT INSERTION  02/24/2020   LAMINECTOMY FOR EXCISION / EVACUATION INTRASPINAL LESION  11/07/2019   T1-T 6 at [facility]     SOCIAL HISTORY: Social History   Socioeconomic History   Marital status: Married    Spouse name: [name]    Number of children: 4   Years of education: Not on file   Highest education level: Some college, no degree  Occupational History   Occupation: dispatch and  delivery   Tobacco Use   Smoking status: Former    Packs/day: 2.00    Years: 30.00    Pack years: 60.00    Types: Cigarettes    Quit date: 07/29/2011    Years since quitting: 9.4   Smokeless tobacco: Never   Tobacco comments:    pt vapes  Vaping Use   Vaping Use: Not on file  Substance and Sexual Activity   Alcohol use: Yes    Alcohol/week: 5.0 standard  drinks    Types: 5 Standard drinks or equivalent per week    Comment: 1-2/day   Drug use: No   Sexual activity: Not Currently    Partners: Female  Other Topics Concern   Not on file  Social History Narrative   Worked in warehouse/ quit smoking in 2015; beer 1/day; in [place]; with wife.    Social Determinants of Health   Financial Resource Strain: Not on file  Food Insecurity: Not on file  Transportation Needs: Not on file  Physical Activity: Not on file  Stress: Not on file  Social Connections: Not on file  Intimate Partner Violence: Not on file    FAMILY HISTORY: Family History  Problem Relation Age of Onset   Heart disease Father    CVA Mother    Lung cancer Sister    Lung cancer Brother     ALLERGIES:  has No Known Allergies.  MEDICATIONS:  Current Outpatient Medications  Medication Sig Dispense Refill   acetaminophen (TYLENOL) 500 MG tablet Take 500-1,000 mg by mouth every 6 (six) hours as needed for mild pain or fever.      albuterol (VENTOLIN HFA) 108 (90 Base) MCG/ACT inhaler Inhale 2 puffs into the lungs every 4 (four) hours as needed for wheezing or shortness of breath. 18 g 0   Calcium Carb-Cholecalciferol (OYSTER SHELL CALCIUM) 500-400 MG-UNIT TABS Take 500 tablets by mouth.     escitalopram (LEXAPRO) 5 MG tablet Take 1 tablet (5 mg total) by mouth daily. am's 90 tablet 1   Fluticasone-Umeclidin-Vilant (TRELEGY ELLIPTA) 100-62.5-25 MCG/INH AEPB Inhale 1 puff into the lungs daily. 180 each 1   lidocaine-prilocaine (EMLA) cream Apply 1 application topically as needed. Apply small amount to port site at least 1 hour prior to it being accessed, cover with plastic wrap 30 g 1   Multiple Vitamins-Minerals (MULTIVITAMIN GUMMIES MENS PO) Take 1 Dose by mouth daily.      ondansetron (ZOFRAN) 4 MG tablet Take 1 tablet (4 mg total) by mouth every 8 (eight) hours as needed for nausea or vomiting. 20 tablet 2   prochlorperazine (COMPAZINE) 10 MG tablet Take 1 tablet (10  mg total) by mouth every 6 (six) hours as needed for nausea or vomiting. 40 tablet 1   QUEtiapine (SEROQUEL) 25 MG tablet Take 1 tablet (25 mg total) by mouth at bedtime. 90 tablet 1   No current facility-administered medications for this visit.      Marland Kitchen  PHYSICAL EXAMINATION: ECOG PERFORMANCE STATUS: 3 - Symptomatic, >50% confined to bed  Vitals:   01/15/21 0916  BP: (!) 120/93  Pulse: 75  Resp: (!) 22  Temp: (!) 95.4 F (35.2 C)  SpO2: 100%   Filed Weights   01/15/21 0916  Weight: 119 lb 12.8 oz (54.3 kg)    Physical Exam Constitutional:      Comments: Thin built frail appearing male patient.  No acute distress.  Accompanied by his family.   HENT:     Head: Normocephalic and  atraumatic.     Mouth/Throat:     Pharynx: No oropharyngeal exudate.  Eyes:     Pupils: Pupils are equal, round, and reactive to light.  Cardiovascular:     Rate and Rhythm: Normal rate and regular rhythm.  Pulmonary:     Effort: No respiratory distress.     Breath sounds: No wheezing.     Comments: Decreased air entry bilaterally. Abdominal:     General: Bowel sounds are normal. There is no distension.     Palpations: Abdomen is soft. There is no mass.     Tenderness: There is no abdominal tenderness. There is no guarding or rebound.  Musculoskeletal:        General: No tenderness. Normal range of motion.     Cervical back: Normal range of motion and neck supple.  Skin:    General: Skin is warm.  Neurological:     Mental Status: He is alert and oriented to person, place, and time.  Psychiatric:        Mood and Affect: Affect normal.     LABORATORY DATA:  I have reviewed the data as listed Lab Results  Component Value Date   WBC 6.1 01/15/2021   HGB 14.5 01/15/2021   HCT 43.6 01/15/2021   MCV 93.8 01/15/2021   PLT 234 01/15/2021   Recent Labs    03/14/20 0831 04/04/20 0827 04/25/20 0834 05/16/20 0824 12/03/20 0847 12/25/20 1357 01/15/21 0907  NA 139 137 139   < > 135  133* 136  K 3.9 4.3 4.2   < > 4.3 4.1 4.0  CL 103 99 102   < > 96* 90* 90*  CO2 29 28 28    < > 31 33* 37*  GLUCOSE 104* 181* 102*   < > 116* 107* 101*  BUN 19 19 15    < > 12 10 10   CREATININE 0.56* 0.61 0.60*   < > 0.56* 0.57* 0.53*  CALCIUM 8.8* 8.8* 8.8*   < > 9.2 9.4 8.9  GFRNONAA >60 >60 >60   < > >60 >60 >60  GFRAA >60 >60 >60  --   --   --   --   PROT 6.9 6.7 6.4*   < > 7.3 7.4 7.4  ALBUMIN 3.8 3.7 3.7   < > 3.6 3.5 3.5  AST 24 29 23    < > 20 20 19   ALT 20 20 19    < > 11 11 11   ALKPHOS 82 77 66   < > 74 86 76  BILITOT 0.5 0.6 0.6   < > 0.5 0.6 0.6   < > = values in this interval not displayed.    RADIOGRAPHIC STUDIES: I have personally reviewed the radiological images as listed and agreed with the findings in the report. CT CHEST ABDOMEN PELVIS W CONTRAST  Result Date: 12/21/2020 CLINICAL DATA:  Non-small cell lung cancer, metastatic. Assess treatment response. EXAM: CT CHEST, ABDOMEN, AND PELVIS WITH CONTRAST TECHNIQUE: Multidetector CT imaging of the chest, abdomen and pelvis was performed following the standard protocol during bolus administration of intravenous contrast. CONTRAST:  44m OMNIPAQUE IOHEXOL 300 MG/ML  SOLN COMPARISON:  August 15, 2020. FINDINGS: CT CHEST FINDINGS Cardiovascular: Calcified atheromatous plaque in the thoracic aorta, no sign of aneurysm. Normal caliber of the central pulmonary vessels. Normal heart size without pericardial effusion. Three-vessel coronary artery disease. RIGHT-sided Port-A-Cath terminates at the caval to atrial junction. Mediastinum/Nodes: Thoracic inlet structures are normal. No axillary lymphadenopathy. No mediastinal lymphadenopathy. No gross  hilar lymphadenopathy. Lungs/Pleura: Pleural and parenchymal scarring in the RIGHT upper lobe is similar to the prior study. Nodular area along the anterior RIGHT upper lobe on image 38/10 measuring 1.9 x 1.5 cm is within 1 mm of its previous size. No lobar consolidation. No sign of pleural  effusion. Severe pulmonary emphysema. No new suspicious pulmonary nodule. Airways are patent. Musculoskeletal: Signs of spinal fusion and laminectomy in the upper thoracic spine with destructive changes bridged by rod and pedicle screw fixation showing a similar appearance. Destructive changes in ribs with well-defined margins and signs of sclerosis in the RIGHT posterior upper chest with similar appearance. CT ABDOMEN PELVIS FINDINGS Hepatobiliary: Peripheral biliary duct distension extending into the RIGHT hepatic lobe with small central RIGHT hepatic lobe lesion. (Image 73/9) 1.3 x 1.1 cm central hepatic lesion previously 1.5 x 1.8 cm. Biliary duct distension is unchanged. No new hepatic lesion. No pericholecystic stranding. Pancreas: Normal, without mass, inflammation or ductal dilatation. Spleen: Spleen normal size and contour. Adrenals/Urinary Tract: Adrenal glands are normal. Symmetric renal enhancement. No sign of hydronephrosis. No suspicious renal lesion. Urinary bladder is collapsed limiting assessment. Stomach/Bowel: No acute gastrointestinal finding. Ingested oral contrast media passes throughout the small bowel and into the colon. Vascular/Lymphatic: Calcified atheromatous plaque and noncalcified plaque in the abdominal aorta. Smooth contour of the IVC. There is no gastrohepatic or hepatoduodenal ligament lymphadenopathy. No retroperitoneal or mesenteric lymphadenopathy. No pelvic sidewall lymphadenopathy. Reproductive: Heterogeneous prostate. Nonspecific on CT and unchanged. Other: 2.1 x 1.8 cm area in the RIGHT inguinal region along the margin of herniorrhaphy changes previously approximately 1.9 x 1.7 cm. No ascites. Musculoskeletal: No acute musculoskeletal process. Signs of spinal fusion extending from T1 through T6 without interval change spanning vertebral body and bony destruction at the T3 and T4 levels and pathologic fracture of T3, grossly similar loss of height without associated soft  tissue or notable change from previous imaging. IMPRESSION: 1. Slight interval decrease in size of the central RIGHT hepatic lobe lesion. 2. Stable post treatment changes in the RIGHT upper lobe. 3. No new or progressive findings. 4. Unchanged appearance of multilevel fusion spanning destructive changes and pathologic fractures at T3 and T4. 5. Stable segmental biliary duct distension 6. RIGHT inguinal nodule adjacent to herniorrhaphy changes may represent postoperative process or complex sebaceous cyst. Minimally enlarged since April of 2021 and showing no increased metabolic activity on previous PET imaging. Could consider focused ultrasound of this area would also correlate with any symptoms as this is likely amenable to direct clinical inspection/palpation. 7. Emphysema and aortic atherosclerosis. 8. Three-vessel coronary artery disease. Electronically Signed   By: Zetta Bills M.D.   On: 12/21/2020 17:09     ASSESSMENT & PLAN:   Cancer of upper lobe of right lung ([place]) # Stage IV metastatic non-small cell lung cancer favor adeno; mets to bone; liver.  Currently on Keytruda maintenance; MAY 26th, 2022- STABLE;  right upper lobe lesion; slight decrease in the hepatic metastatic disease; stable T3-T4 compression fractures/T1 T6 spinal fusion.  STABLE.   # Proceed with [place]. Labs today reviewed;  acceptable for treatment today.   # Bone metastases- on zometa 3 mg IVPB.  Every 6 weeks. Ca-8.8.  # left shoulder pain- [facility]- recommend NSAIDs/Topical.   #Debility-spinal cord compression/status post surgery-s/p physical therapy--STABLE  # sublicnial hypothyroidism- TSH slightlly elevated-6.9; but T3-T4 normal.  Monitor for now.  STABLE.   # COPD-STABLE; on 2 lit O2; Trelegy.  # DISPOSITION: # Keytruda;  # follow up on July  11th/Monday MD;labs- cbc/cmp; Keytruda;zometa;Dr.B    All questions were answered. The patient knows to call the clinic with any problems, questions or concerns.     Cammie Sickle, MD 01/15/2021 10:17 PM

## 2021-01-15 NOTE — Patient Instructions (Signed)
[name]  Discharge Instructions: Thank you for choosing [facility] to provide your oncology and hematology care.  If you have a lab appointment with the [facility], please go directly to the [facility] and check in at the registration area.  Wear comfortable clothing and clothing appropriate for easy access to any Portacath or PICC line.   We strive to give you quality time with your provider. You may need to reschedule your appointment if you arrive late (15 or more minutes).  Arriving late affects you and other patients whose appointments are after yours.  Also, if you miss three or more appointments without notifying the office, you may be dismissed from the clinic at the provider's discretion.      For prescription refill requests, have your pharmacy contact our office and allow 72 hours for refills to be completed.    Today you received the following chemotherapy and/or immunotherapy agents Keytruda      To help prevent nausea and vomiting after your treatment, we encourage you to take your nausea medication as directed.  BELOW ARE SYMPTOMS THAT SHOULD BE REPORTED IMMEDIATELY: *FEVER GREATER THAN 100.4 F (38 C) OR HIGHER *CHILLS OR SWEATING *NAUSEA AND VOMITING THAT IS NOT CONTROLLED WITH YOUR NAUSEA MEDICATION *UNUSUAL SHORTNESS OF BREATH *UNUSUAL BRUISING OR BLEEDING *URINARY PROBLEMS (pain or burning when urinating, or frequent urination) *BOWEL PROBLEMS (unusual diarrhea, constipation, pain near the anus) TENDERNESS IN MOUTH AND THROAT WITH OR WITHOUT PRESENCE OF ULCERS (sore throat, sores in mouth, or a toothache) UNUSUAL RASH, SWELLING OR PAIN  UNUSUAL VAGINAL DISCHARGE OR ITCHING   Items with * indicate a potential emergency and should be followed up as soon as possible or go to the Emergency Department if any problems should occur.  Please show the CHEMOTHERAPY ALERT CARD or IMMUNOTHERAPY ALERT CARD at check-in to  the Emergency Department and triage nurse.  Should you have questions after your visit or need to cancel or reschedule your appointment, please contact [facility]  [phone] and follow the prompts.  Office hours are 8:00 a.m. to 4:30 p.m. Monday - Friday. Please note that voicemails left after 4:00 p.m. may not be returned until the following business day.  We are closed weekends and major holidays. You have access to a nurse at all times for urgent questions. Please call the main number to the clinic [phone] and follow the prompts.  For any non-urgent questions, you may also contact your provider using MyChart. We now offer e-Visits for anyone 27 and older to request care online for non-urgent symptoms. For details visit [email or web address].   Also download the MyChart app! Go to the app store, search "MyChart", open the app, select , and log in with your MyChart username and password.  Due to Covid, a mask is required upon entering the hospital/clinic. If you do not have a mask, one will be given to you upon arrival. For doctor visits, patients may have 1 support person aged 48 or older with them. For treatment visits, patients cannot have anyone with them due to current Covid guidelines and our immunocompromised population. Pembrolizumab injection What is this medication? PEMBROLIZUMAB (pem broe liz ue mab) is a monoclonal antibody. It is used totreat certain types of cancer. This medicine may be used for other purposes; ask your health care provider orpharmacist if you have questions. COMMON BRAND NAME(S): Keytruda What should I tell my care team before I take this  medication? They need to know if you have any of these conditions: autoimmune diseases like Crohn's disease, ulcerative colitis, or lupus have had or planning to have an allogeneic stem cell transplant (uses someone else's stem cells) history of organ transplant history of  chest radiation nervous system problems like myasthenia gravis or Guillain-Barre syndrome an unusual or allergic reaction to pembrolizumab, other medicines, foods, dyes, or preservatives pregnant or trying to get pregnant breast-feeding How should I use this medication? This medicine is for infusion into a vein. It is given by a health careprofessional in a hospital or clinic setting. A special MedGuide will be given to you before each treatment. Be sure to readthis information carefully each time. Talk to your pediatrician regarding the use of this medicine in children. While this drug may be prescribed for children as young as 6 months for selectedconditions, precautions do apply. Overdosage: If you think you have taken too much of this medicine contact apoison control center or emergency room at once. NOTE: This medicine is only for you. Do not share this medicine with others. What if I miss a dose? It is important not to miss your dose. Call your doctor or health careprofessional if you are unable to keep an appointment. What may interact with this medication? Interactions have not been studied. This list may not describe all possible interactions. Give your health care provider a list of all the medicines, herbs, non-prescription drugs, or dietary supplements you use. Also tell them if you smoke, drink alcohol, or use illegaldrugs. Some items may interact with your medicine. What should I watch for while using this medication? Your condition will be monitored carefully while you are receiving thismedicine. You may need blood work done while you are taking this medicine. Do not become pregnant while taking this medicine or for 4 months after stopping it. Women should inform their doctor if they wish to become pregnant or think they might be pregnant. There is a potential for serious side effects to an unborn child. Talk to your health care professional or pharmacist for more information. Do  not breast-feed an infant while taking this medicine orfor 4 months after the last dose. What side effects may I notice from receiving this medication? Side effects that you should report to your doctor or health care professionalas soon as possible: allergic reactions like skin rash, itching or hives, swelling of the face, lips, or tongue bloody or black, tarry breathing problems changes in vision chest pain chills confusion constipation cough diarrhea dizziness or feeling faint or lightheaded fast or irregular heartbeat fever flushing joint pain low blood counts - this medicine may decrease the number of white blood cells, red blood cells and platelets. You may be at increased risk for infections and bleeding. muscle pain muscle weakness pain, tingling, numbness in the hands or feet persistent headache redness, blistering, peeling or loosening of the skin, including inside the mouth signs and symptoms of high blood sugar such as dizziness; dry mouth; dry skin; fruity breath; nausea; stomach pain; increased hunger or thirst; increased urination signs and symptoms of kidney injury like trouble passing urine or change in the amount of urine signs and symptoms of liver injury like dark urine, light-colored stools, loss of appetite, nausea, right upper belly pain, yellowing of the eyes or skin sweating swollen lymph nodes weight loss Side effects that usually do not require medical attention (report to yourdoctor or health care professional if they continue or are bothersome): decreased appetite hair loss tiredness  This list may not describe all possible side effects. Call your doctor for medical advice about side effects. You may report side effects to FDA at1-800-FDA-1088. Where should I keep my medication? This drug is given in a hospital or clinic and will not be stored at home. NOTE: This sheet is a summary. It may not cover all possible information. If you have questions about  this medicine, talk to your doctor, pharmacist, orhealth care provider.  2022 Elsevier/Gold Standard (2019-06-15 21:44:53)

## 2021-01-15 NOTE — Assessment & Plan Note (Addendum)
#   Stage IV metastatic non-small cell lung cancer favor adeno; mets to bone; liver.  Currently on Keytruda maintenance; MAY 26th, 2022- STABLE;  right upper lobe lesion; slight decrease in the hepatic metastatic disease; stable T3-T4 compression fractures/T1 T6 spinal fusion.  STABLE.   # Proceed with [place]. Labs today reviewed;  acceptable for treatment today.   # Bone metastases- on zometa 3 mg IVPB.  Every 6 weeks. Ca-8.8.  # left shoulder pain- [facility]- recommend NSAIDs/Topical.   #Debility-spinal cord compression/status post surgery-s/p physical therapy--STABLE  # sublicnial hypothyroidism- TSH slightlly elevated-6.9; but T3-T4 normal.  Monitor for now.  STABLE.   # COPD-STABLE; on 2 lit O2; Trelegy.  # DISPOSITION: # Keytruda;  # follow up on July 11th/Monday MD;labs- cbc/cmp; Keytruda;zometa;Dr.B

## 2021-01-29 DIAGNOSIS — J9601 Acute respiratory failure with hypoxia: Secondary | ICD-10-CM | POA: Diagnosis not present

## 2021-01-29 DIAGNOSIS — J441 Chronic obstructive pulmonary disease with (acute) exacerbation: Secondary | ICD-10-CM | POA: Diagnosis not present

## 2021-02-04 ENCOUNTER — Inpatient Hospital Stay: Payer: Medicare HMO | Attending: Internal Medicine

## 2021-02-04 ENCOUNTER — Other Ambulatory Visit: Payer: Self-pay

## 2021-02-04 ENCOUNTER — Inpatient Hospital Stay: Payer: Medicare HMO

## 2021-02-04 ENCOUNTER — Encounter: Payer: Self-pay | Admitting: Internal Medicine

## 2021-02-04 ENCOUNTER — Inpatient Hospital Stay (HOSPITAL_BASED_OUTPATIENT_CLINIC_OR_DEPARTMENT_OTHER): Payer: Medicare HMO | Admitting: Internal Medicine

## 2021-02-04 DIAGNOSIS — Z87891 Personal history of nicotine dependence: Secondary | ICD-10-CM | POA: Insufficient documentation

## 2021-02-04 DIAGNOSIS — C3411 Malignant neoplasm of upper lobe, right bronchus or lung: Secondary | ICD-10-CM | POA: Insufficient documentation

## 2021-02-04 DIAGNOSIS — Z79899 Other long term (current) drug therapy: Secondary | ICD-10-CM | POA: Diagnosis not present

## 2021-02-04 DIAGNOSIS — C7951 Secondary malignant neoplasm of bone: Secondary | ICD-10-CM | POA: Diagnosis not present

## 2021-02-04 DIAGNOSIS — N400 Enlarged prostate without lower urinary tract symptoms: Secondary | ICD-10-CM | POA: Diagnosis not present

## 2021-02-04 DIAGNOSIS — Z801 Family history of malignant neoplasm of trachea, bronchus and lung: Secondary | ICD-10-CM | POA: Insufficient documentation

## 2021-02-04 DIAGNOSIS — M25512 Pain in left shoulder: Secondary | ICD-10-CM | POA: Diagnosis not present

## 2021-02-04 DIAGNOSIS — R0602 Shortness of breath: Secondary | ICD-10-CM | POA: Insufficient documentation

## 2021-02-04 DIAGNOSIS — Z8249 Family history of ischemic heart disease and other diseases of the circulatory system: Secondary | ICD-10-CM | POA: Insufficient documentation

## 2021-02-04 DIAGNOSIS — Z5112 Encounter for antineoplastic immunotherapy: Secondary | ICD-10-CM | POA: Diagnosis not present

## 2021-02-04 DIAGNOSIS — M549 Dorsalgia, unspecified: Secondary | ICD-10-CM | POA: Diagnosis not present

## 2021-02-04 DIAGNOSIS — R5381 Other malaise: Secondary | ICD-10-CM | POA: Insufficient documentation

## 2021-02-04 DIAGNOSIS — R5383 Other fatigue: Secondary | ICD-10-CM | POA: Diagnosis not present

## 2021-02-04 DIAGNOSIS — E039 Hypothyroidism, unspecified: Secondary | ICD-10-CM | POA: Insufficient documentation

## 2021-02-04 DIAGNOSIS — Z7289 Other problems related to lifestyle: Secondary | ICD-10-CM | POA: Diagnosis not present

## 2021-02-04 DIAGNOSIS — Z981 Arthrodesis status: Secondary | ICD-10-CM | POA: Insufficient documentation

## 2021-02-04 DIAGNOSIS — Z7189 Other specified counseling: Secondary | ICD-10-CM

## 2021-02-04 DIAGNOSIS — C787 Secondary malignant neoplasm of liver and intrahepatic bile duct: Secondary | ICD-10-CM | POA: Insufficient documentation

## 2021-02-04 DIAGNOSIS — M4854XA Collapsed vertebra, not elsewhere classified, thoracic region, initial encounter for fracture: Secondary | ICD-10-CM | POA: Insufficient documentation

## 2021-02-04 DIAGNOSIS — M2550 Pain in unspecified joint: Secondary | ICD-10-CM | POA: Diagnosis not present

## 2021-02-04 LAB — CBC WITH DIFFERENTIAL/PLATELET
Abs Immature Granulocytes: 0.01 10*3/uL (ref 0.00–0.07)
Basophils Absolute: 0 10*3/uL (ref 0.0–0.1)
Basophils Relative: 1 %
Eosinophils Absolute: 0.5 10*3/uL (ref 0.0–0.5)
Eosinophils Relative: 8 %
HCT: 44.5 % (ref 39.0–52.0)
Hemoglobin: 14.3 g/dL (ref 13.0–17.0)
Immature Granulocytes: 0 %
Lymphocytes Relative: 17 %
Lymphs Abs: 1.1 10*3/uL (ref 0.7–4.0)
MCH: 30.6 pg (ref 26.0–34.0)
MCHC: 32.1 g/dL (ref 30.0–36.0)
MCV: 95.1 fL (ref 80.0–100.0)
Monocytes Absolute: 0.8 10*3/uL (ref 0.1–1.0)
Monocytes Relative: 13 %
Neutro Abs: 4 10*3/uL (ref 1.7–7.7)
Neutrophils Relative %: 61 %
Platelets: 220 10*3/uL (ref 150–400)
RBC: 4.68 MIL/uL (ref 4.22–5.81)
RDW: 12.3 % (ref 11.5–15.5)
WBC: 6.4 10*3/uL (ref 4.0–10.5)
nRBC: 0 % (ref 0.0–0.2)

## 2021-02-04 LAB — COMPREHENSIVE METABOLIC PANEL
ALT: 12 U/L (ref 0–44)
AST: 22 U/L (ref 15–41)
Albumin: 3.5 g/dL (ref 3.5–5.0)
Alkaline Phosphatase: 80 U/L (ref 38–126)
Anion gap: 4 — ABNORMAL LOW (ref 5–15)
BUN: 15 mg/dL (ref 8–23)
CO2: 33 mmol/L — ABNORMAL HIGH (ref 22–32)
Calcium: 8.4 mg/dL — ABNORMAL LOW (ref 8.9–10.3)
Chloride: 99 mmol/L (ref 98–111)
Creatinine, Ser: 0.49 mg/dL — ABNORMAL LOW (ref 0.61–1.24)
GFR, Estimated: >60 mL/min (ref >60)
Glucose, Bld: 114 mg/dL — ABNORMAL HIGH (ref 70–99)
Potassium: 4.2 mmol/L (ref 3.5–5.1)
Sodium: 136 mmol/L (ref 135–145)
Total Bilirubin: 0.3 mg/dL (ref 0.3–1.2)
Total Protein: 7.4 g/dL (ref 6.5–8.1)

## 2021-02-04 MED ORDER — SODIUM CHLORIDE 0.9% FLUSH
10.0000 mL | Freq: Once | INTRAVENOUS | Status: AC
Start: 2021-02-04 — End: 2021-02-04
  Administered 2021-02-04: 10 mL via INTRAVENOUS
  Filled 2021-02-04: qty 10

## 2021-02-04 MED ORDER — SODIUM CHLORIDE 0.9 % IV SOLN
200.0000 mg | Freq: Once | INTRAVENOUS | Status: AC
  Administered 2021-02-04: 200 mg via INTRAVENOUS
  Filled 2021-02-04: qty 8

## 2021-02-04 MED ORDER — SODIUM CHLORIDE 0.9 % IV SOLN
Freq: Once | INTRAVENOUS | Status: AC
Start: 2021-02-04 — End: 2021-02-04
  Filled 2021-02-04: qty 250

## 2021-02-04 MED ORDER — SODIUM CHLORIDE 0.9% FLUSH
10.0000 mL | INTRAVENOUS | Status: DC | PRN
Start: 2021-02-04 — End: 2021-02-04
  Filled 2021-02-04: qty 10

## 2021-02-04 MED ORDER — HEPARIN SOD (PORK) LOCK FLUSH 100 UNIT/ML IV SOLN
INTRAVENOUS | Status: AC
  Filled 2021-02-04: qty 5

## 2021-02-04 MED ORDER — ZOLEDRONIC ACID 4 MG/5ML IV CONC
3.0000 mg | Freq: Once | INTRAVENOUS | Status: AC
  Administered 2021-02-04: 3 mg via INTRAVENOUS
  Filled 2021-02-04: qty 3.75

## 2021-02-04 MED ORDER — HEPARIN SOD (PORK) LOCK FLUSH 100 UNIT/ML IV SOLN
500.0000 [IU] | Freq: Once | INTRAVENOUS | Status: AC | PRN
  Administered 2021-02-04: 500 [IU]
  Filled 2021-02-04: qty 5

## 2021-02-04 NOTE — Progress Notes (Signed)
Calcium 8.4 today, okay to proceed with zometa per Dr. Rogue Bussing.  Pt tolerated all infusions well today with no problems or complaints. Pt left infusion suite stable and ambulatory.

## 2021-02-04 NOTE — Assessment & Plan Note (Addendum)
#   Stage IV metastatic non-small cell lung cancer favor adeno; mets to bone; liver.  Currently on Keytruda maintenance; MAY 26th, 2022- STABLE;  right upper lobe lesion; slight decrease in the hepatic metastatic disease; stable T3-T4 compression fractures/T1 T6 spinal fusion. Clinically stable.    # Proceed with [place]. Labs today reviewed;  acceptable for treatment today.   # Bone metastases- on zometa 3 mg IVPB.  Every 6 weeks. Ca-8.8.Clinically stable.   # left shoulder pain- [facility]- recommend NSAIDs/Topical. .Clinically stable.   #Debility-spinal cord compression/status post surgery-s/p physical therapy-. Clinically stable.   # sublicnial hypothyroidism- TSH slightlly elevated-6.9; but T3-T4 normal.  Monitor for now. .Clinically stable.   # COPD-.Clinically stable.  on 2 lit O2; Trelegy.  # DISPOSITION: # Keytruda; zometa # follow up in 3 weeks MD;labs- cbc/cmp; thyroid profile; Keytruda;;Dr.B

## 2021-02-04 NOTE — Patient Instructions (Signed)
[facility]  Discharge Instructions: Thank you for choosing [facility] to provide your oncology and hematology care.  If you have a lab appointment with the [facility], please go directly to the [facility] and check in at the registration area.  Wear comfortable clothing and clothing appropriate for easy access to any Portacath or PICC line.   We strive to give you quality time with your provider. You may need to reschedule your appointment if you arrive late (15 or more minutes).  Arriving late affects you and other patients whose appointments are after yours.  Also, if you miss three or more appointments without notifying the office, you may be dismissed from the clinic at the provider's discretion.      For prescription refill requests, have your pharmacy contact our office and allow 72 hours for refills to be completed.    Today you received the following chemotherapy and/or immunotherapy agents zometa, [name]      To help prevent nausea and vomiting after your treatment, we encourage you to take your nausea medication as directed.  BELOW ARE SYMPTOMS THAT SHOULD BE REPORTED IMMEDIATELY: *FEVER GREATER THAN 100.4 F (38 C) OR HIGHER *CHILLS OR SWEATING *NAUSEA AND VOMITING THAT IS NOT CONTROLLED WITH YOUR NAUSEA MEDICATION *UNUSUAL SHORTNESS OF BREATH *UNUSUAL BRUISING OR BLEEDING *URINARY PROBLEMS (pain or burning when urinating, or frequent urination) *BOWEL PROBLEMS (unusual diarrhea, constipation, pain near the anus) TENDERNESS IN MOUTH AND THROAT WITH OR WITHOUT PRESENCE OF ULCERS (sore throat, sores in mouth, or a toothache) UNUSUAL RASH, SWELLING OR PAIN  UNUSUAL VAGINAL DISCHARGE OR ITCHING   Items with * indicate a potential emergency and should be followed up as soon as possible or go to the Emergency Department if any problems should occur.  Please show the CHEMOTHERAPY ALERT CARD or IMMUNOTHERAPY ALERT CARD at  check-in to the Emergency Department and triage nurse.  Should you have questions after your visit or need to cancel or reschedule your appointment, please contact [facility]  [phone] and follow the prompts.  Office hours are 8:00 a.m. to 4:30 p.m. Monday - Friday. Please note that voicemails left after 4:00 p.m. may not be returned until the following business day.  We are closed weekends and major holidays. You have access to a nurse at all times for urgent questions. Please call the main number to the clinic [phone] and follow the prompts.  For any non-urgent questions, you may also contact your provider using MyChart. We now offer e-Visits for anyone 38 and older to request care online for non-urgent symptoms. For details visit [email or web address].   Also download the MyChart app! Go to the app store, search "MyChart", open the app, select [facility], and log in with your MyChart username and password.  Due to Covid, a mask is required upon entering the hospital/clinic. If you do not have a mask, one will be given to you upon arrival. For doctor visits, patients may have 1 support person aged 47 or older with them. For treatment visits, patients cannot have anyone with them due to current Covid guidelines and our immunocompromised population.   Zoledronic Acid Injection (Hypercalcemia, Oncology) What is this medication? ZOLEDRONIC ACID (ZOE le dron ik AS id) slows calcium loss from bones. It high calcium levels in the blood from some kinds of cancer. It may be used in otherpeople at risk for bone loss. This medicine may be used for other purposes; ask  your health care provider orpharmacist if you have questions. COMMON BRAND NAME(S): Zometa What should I tell my care team before I take this medication? They need to know if you have any of these conditions: cancer dehydration dental disease kidney disease liver disease low levels of calcium  in the blood lung or breathing disease (asthma) receiving steroids like dexamethasone or prednisone an unusual or allergic reaction to zoledronic acid, other medicines, foods, dyes, or preservatives pregnant or trying to get pregnant breast-feeding How should I use this medication? This drug is injected into a vein. It is given by a health care provider in [place] or clinic setting. Talk to your health care provider about the use of this drug in children.Special care may be needed. Overdosage: If you think you have taken too much of this medicine contact apoison control center or emergency room at once. NOTE: This medicine is only for you. Do not share this medicine with others. What if I miss a dose? Keep appointments for follow-up doses. It is important not to miss your dose.Call your health care provider if you are unable to keep an appointment. What may interact with this medication? certain antibiotics given by injection NSAIDs, medicines for pain and inflammation, like ibuprofen or naproxen some diuretics like bumetanide, furosemide teriparatide thalidomide This list may not describe all possible interactions. Give your health care provider a list of all the medicines, herbs, non-prescription drugs, or dietary supplements you use. Also tell them if you smoke, drink alcohol, or use illegaldrugs. Some items may interact with your medicine. What should I watch for while using this medication? Visit your health care provider for regular checks on your progress. It may besome time before you see the benefit from this drug. Some people who take this drug have severe bone, joint, or muscle pain. This drug may also increase your risk for jaw problems or a broken thigh bone. Tell your health care provider right away if you have severe pain in your jaw, bones, joints, or muscles. Tell you health care provider if you have any painthat does not go away or that gets worse. Tell your dentist and  dental surgeon that you are taking this drug. You should not have major dental surgery while on this drug. See your dentist to have a dental exam and fix any dental problems before starting this drug. Take good care of your teeth while on this drug. Make sure you see your dentist forregular follow-up appointments. You should make sure you get enough calcium and vitamin D while you are taking this drug. Discuss the foods you eat and the vitamins you take with your healthcare provider. Check with your health care provider if you have severe diarrhea, nausea, and vomiting, or if you sweat a lot. The loss of too much body fluid may make itdangerous for you to take this drug. You may need blood work done while you are taking this drug. Do not become pregnant while taking this drug. Women should inform their health care provider if they wish to become pregnant or think they might be pregnant. There is potential for serious harm to an unborn child. Talk to your healthcare provider for more information. What side effects may I notice from receiving this medication? Side effects that you should report to your doctor or health care provider assoon as possible: allergic reactions (skin rash, itching or hives; swelling of the face, lips, or tongue) bone pain infection (fever, chills, cough, sore throat, pain or trouble passing  urine) jaw pain, especially after dental work joint pain kidney injury (trouble passing urine or change in the amount of urine) low blood pressure (dizziness; feeling faint or lightheaded, falls; unusually weak or tired) low calcium levels (fast heartbeat; muscle cramps or pain; pain, tingling, or numbness in the hands or feet; seizures) low magnesium levels (fast, irregular heartbeat; muscle cramp or pain; muscle weakness; tremors; seizures) low red blood cell counts (trouble breathing; feeling faint; lightheaded, falls; unusually weak or tired) muscle pain redness, blistering, peeling,  or loosening of the skin, including inside the mouth severe diarrhea swelling of the ankles, feet, hands trouble breathing Side effects that usually do not require medical attention (report to yourdoctor or health care provider if they continue or are bothersome): anxious constipation coughing depressed mood eye irritation, itching, or pain fever general ill feeling or flu-like symptoms nausea pain, redness, or irritation at site where injected trouble sleeping This list may not describe all possible side effects. Call your doctor for medical advice about side effects. You may report side effects to FDA at1-800-FDA-1088. Where should I keep my medication? This drug is given in a hospital or clinic. It will not be stored at home. NOTE: This sheet is a summary. It may not cover all possible information. If you have questions about this medicine, talk to your doctor, pharmacist, orhealth care provider.  2022 Elsevier/Gold Standard (2019-04-28 09:13:00)  Pembrolizumab injection What is this medication? PEMBROLIZUMAB (pem broe liz ue mab) is a monoclonal antibody. It is used totreat certain types of cancer. This medicine may be used for other purposes; ask your health care provider orpharmacist if you have questions. COMMON BRAND NAME(S): Keytruda What should I tell my care team before I take this medication? They need to know if you have any of these conditions: autoimmune diseases like Crohn's disease, ulcerative colitis, or lupus have had or planning to have an allogeneic stem cell transplant (uses someone else's stem cells) history of organ transplant history of chest radiation nervous system problems like myasthenia gravis or Guillain-Barre syndrome an unusual or allergic reaction to pembrolizumab, other medicines, foods, dyes, or preservatives pregnant or trying to get pregnant breast-feeding How should I use this medication? This medicine is for infusion into a vein. It is  given by a health careprofessional in a hospital or clinic setting. A special MedGuide will be given to you before each treatment. Be sure to readthis information carefully each time. Talk to your pediatrician regarding the use of this medicine in children. While this drug may be prescribed for children as young as 6 months for selectedconditions, precautions do apply. Overdosage: If you think you have taken too much of this medicine contact apoison control center or emergency room at once. NOTE: This medicine is only for you. Do not share this medicine with others. What if I miss a dose? It is important not to miss your dose. Call your doctor or health careprofessional if you are unable to keep an appointment. What may interact with this medication? Interactions have not been studied. This list may not describe all possible interactions. Give your health care provider a list of all the medicines, herbs, non-prescription drugs, or dietary supplements you use. Also tell them if you smoke, drink alcohol, or use illegaldrugs. Some items may interact with your medicine. What should I watch for while using this medication? Your condition will be monitored carefully while you are receiving thismedicine. You may need blood work done while you are taking this medicine. Do  not become pregnant while taking this medicine or for 4 months after stopping it. Women should inform their doctor if they wish to become pregnant or think they might be pregnant. There is a potential for serious side effects to an unborn child. Talk to your health care professional or pharmacist for more information. Do not breast-feed an infant while taking this medicine orfor 4 months after the last dose. What side effects may I notice from receiving this medication? Side effects that you should report to your doctor or health care professionalas soon as possible: allergic reactions like skin rash, itching or hives, swelling of the face,  lips, or tongue bloody or black, tarry breathing problems changes in vision chest pain chills confusion constipation cough diarrhea dizziness or feeling faint or lightheaded fast or irregular heartbeat fever flushing joint pain low blood counts - this medicine may decrease the number of white blood cells, red blood cells and platelets. You may be at increased risk for infections and bleeding. muscle pain muscle weakness pain, tingling, numbness in the hands or feet persistent headache redness, blistering, peeling or loosening of the skin, including inside the mouth signs and symptoms of high blood sugar such as dizziness; dry mouth; dry skin; fruity breath; nausea; stomach pain; increased hunger or thirst; increased urination signs and symptoms of kidney injury like trouble passing urine or change in the amount of urine signs and symptoms of liver injury like dark urine, light-colored stools, loss of appetite, nausea, right upper belly pain, yellowing of the eyes or skin sweating swollen lymph nodes weight loss Side effects that usually do not require medical attention (report to yourdoctor or health care professional if they continue or are bothersome): decreased appetite hair loss tiredness This list may not describe all possible side effects. Call your doctor for medical advice about side effects. You may report side effects to FDA at1-800-FDA-1088. Where should I keep my medication? This drug is given in a hospital or clinic and will not be stored at home. NOTE: This sheet is a summary. It may not cover all possible information. If you have questions about this medicine, talk to your doctor, pharmacist, orhealth care provider.  2022 Elsevier/Gold Standard (2019-06-15 21:44:53)

## 2021-02-04 NOTE — Progress Notes (Signed)
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   # MAY 26th-Keytrda x2 cycles; [borderline PS] July 7th-carbo Alimta Keytruda cycle #1  # NGS/MOLECULAR TESTS:   # PALLIATIVE CARE EVALUATION:  # PAIN MANAGEMENT:    DIAGNOSIS:   STAGE:         ;  GOALS:  CURRENT/MOST RECENT THERAPY :     Cancer of upper lobe of right lung ([place])  11/23/2019 Initial Diagnosis   Cancer of upper lobe of right lung ([place])   12/21/2019 -  Chemotherapy    Patient is on Treatment Plan: LUNG CARBOPLATIN / PEMETREXED / PEMBROLIZUMAB Q21D INDUCTION X 4 CYCLES / MAINTENANCE PEMETREXED + PEMBROLIZUMAB        HISTORY OF PRESENTING ILLNESS:  Cody [name] 65 y.o.  male patient with metastatic non-small cell lung cancer; cord compression status post decompressive surgery; currently on Keytruda-is here for follow-up.  Patient denies any worsening shortness of breath or cough.  Denies any chest pain.  Denies any headaches.  Continues to be on chronic nasal oxygen.  No nausea no vomiting no diarrhea.   Review of Systems  Constitutional:  Positive for malaise/fatigue. Negative for chills, diaphoresis and fever.  HENT:  Negative for nosebleeds and sore throat.   Eyes:  Negative for double vision.  Respiratory:   Positive for shortness of breath. Negative for cough, hemoptysis, sputum production and wheezing.   Cardiovascular:  Negative for chest pain, palpitations, orthopnea and leg swelling.  Gastrointestinal:  Negative for abdominal pain, blood in stool, constipation, heartburn, melena, nausea and vomiting.  Genitourinary:  Negative for dysuria, frequency and urgency.  Musculoskeletal:  Positive for back pain and joint pain.  Skin: Negative.  Negative for itching and rash.  Neurological:  Negative for dizziness, tingling, focal weakness, weakness and headaches.  Endo/Heme/Allergies:  Does not bruise/bleed easily.  Psychiatric/Behavioral:  Negative for depression. The patient is not nervous/anxious and does not have insomnia.     MEDICAL HISTORY:  Past Medical History:  Diagnosis Date  . Allergy   . Cancer of upper lobe of right lung ([place]) 10/2019  . COPD (chronic obstructive pulmonary disease) ([place])   . Prostate enlargement     SURGICAL HISTORY: Past Surgical History:  Procedure Laterality Date  . HERNIA REPAIR Bilateral 1982  . HERNIA REPAIR Right 1994  . IR IMAGING GUIDED PORT INSERTION  02/24/2020  . LAMINECTOMY FOR EXCISION / EVACUATION INTRASPINAL LESION  11/07/2019   T1-T 6 at [facility]     SOCIAL HISTORY: Social History   Socioeconomic History  . Marital status: Married    Spouse name: [name]   . Number of children: 4  . Years of education: Not on file  . Highest education level: Some college, no degree  Occupational History  . Occupation: dispatch and delivery   Tobacco Use  . Smoking status: Former    Packs/day: 2.00  Years: 30.00    Pack years: 60.00    Types: Cigarettes    Quit date: 07/29/2011    Years since quitting: 9.5  . Smokeless tobacco: Never  . Tobacco comments:    pt vapes  Vaping Use  . Vaping Use: Not on file  Substance and Sexual Activity  . Alcohol use: Yes    Alcohol/week: 5.0 standard drinks    Types: 5 Standard drinks or equivalent per week     Comment: 1-2/day  . Drug use: No  . Sexual activity: Not Currently    Partners: Female  Other Topics Concern  . Not on file  Social History Narrative   Worked in warehouse/ quit smoking in 2015; beer 1/day; in [place]; with wife.    Social Determinants of Health   Financial Resource Strain: Not on file  Food Insecurity: Not on file  Transportation Needs: Not on file  Physical Activity: Not on file  Stress: Not on file  Social Connections: Not on file  Intimate Partner Violence: Not on file    FAMILY HISTORY: Family History  Problem Relation Age of Onset  . Heart disease Father   . CVA Mother   . Lung cancer Sister   . Lung cancer Brother     ALLERGIES:  has No Known Allergies.  MEDICATIONS:  Current Outpatient Medications  Medication Sig Dispense Refill  . acetaminophen (TYLENOL) 500 MG tablet Take 500-1,000 mg by mouth every 6 (six) hours as needed for mild pain or fever.     Marland Kitchen albuterol (VENTOLIN HFA) 108 (90 Base) MCG/ACT inhaler Inhale 2 puffs into the lungs every 4 (four) hours as needed for wheezing or shortness of breath. 18 g 0  . Calcium Carb-Cholecalciferol (OYSTER SHELL CALCIUM) 500-400 MG-UNIT TABS Take 500 tablets by mouth.    . escitalopram (LEXAPRO) 5 MG tablet Take 1 tablet (5 mg total) by mouth daily. am's 90 tablet 1  . Fluticasone-Umeclidin-Vilant (TRELEGY ELLIPTA) 100-62.5-25 MCG/INH AEPB Inhale 1 puff into the lungs daily. 180 each 1  . lidocaine-prilocaine (EMLA) cream Apply 1 application topically as needed. Apply small amount to port site at least 1 hour prior to it being accessed, cover with plastic wrap 30 g 1  . Multiple Vitamins-Minerals (MULTIVITAMIN GUMMIES MENS PO) Take 1 Dose by mouth daily.     . QUEtiapine (SEROQUEL) 25 MG tablet Take 1 tablet (25 mg total) by mouth at bedtime. 90 tablet 1  . ondansetron (ZOFRAN) 4 MG tablet Take 1 tablet (4 mg total) by mouth every 8 (eight) hours as needed for nausea or vomiting. (Patient not taking:  Reported on 02/04/2021) 20 tablet 2  . prochlorperazine (COMPAZINE) 10 MG tablet Take 1 tablet (10 mg total) by mouth every 6 (six) hours as needed for nausea or vomiting. (Patient not taking: Reported on 02/04/2021) 40 tablet 1   No current facility-administered medications for this visit.      Marland Kitchen  PHYSICAL EXAMINATION: ECOG PERFORMANCE STATUS: 3 - Symptomatic, >50% confined to bed  Vitals:   02/04/21 0842  BP: 138/87  Pulse: 86  Resp: 18  Temp: (!) 96.7 F (35.9 C)  SpO2: 100%   Filed Weights   02/04/21 0842  Weight: 122 lb (55.3 kg)    Physical Exam Constitutional:      Comments: Thin built frail appearing male patient.  No acute distress.  Accompanied by his family.   HENT:     Head: Normocephalic and atraumatic.     Mouth/Throat:  Pharynx: No oropharyngeal exudate.  Eyes:     Pupils: Pupils are equal, round, and reactive to light.  Cardiovascular:     Rate and Rhythm: Normal rate and regular rhythm.  Pulmonary:     Effort: No respiratory distress.     Breath sounds: No wheezing.     Comments: Decreased air entry bilaterally. Abdominal:     General: Bowel sounds are normal. There is no distension.     Palpations: Abdomen is soft. There is no mass.     Tenderness: There is no abdominal tenderness. There is no guarding or rebound.  Musculoskeletal:        General: No tenderness. Normal range of motion.     Cervical back: Normal range of motion and neck supple.  Skin:    General: Skin is warm.  Neurological:     Mental Status: He is alert and oriented to person, place, and time.  Psychiatric:        Mood and Affect: Affect normal.     LABORATORY DATA:  I have reviewed the data as listed Lab Results  Component Value Date   WBC 6.4 02/04/2021   HGB 14.3 02/04/2021   HCT 44.5 02/04/2021   MCV 95.1 02/04/2021   PLT 220 02/04/2021   Recent Labs    03/14/20 0831 04/04/20 0827 04/25/20 0834 05/16/20 0824 12/25/20 1357 01/15/21 0907 02/04/21 0834   NA 139 137 139   < > 133* 136 136  K 3.9 4.3 4.2   < > 4.1 4.0 4.2  CL 103 99 102   < > 90* 90* 99  CO2 _0 < > 33* 37* 33*  GLUCOSE 104* 181* 102*   < > 107* 101* 114*  BUN _1 < > _2 CREATININE 0.56* 0.61 0.60*   < > 0.57* 0.53* 0.49*  CALCIUM 8.8* 8.8* 8.8*   < > 9.4 8.9 8.4*  GFRNONAA >60 >60 >60   < > >60 >60 >60  GFRAA >60 >60 >60  --   --   --   --   PROT 6.9 6.7 6.4*   < > 7.4 7.4 7.4  ALBUMIN 3.8 3.7 3.7   < > 3.5 3.5 3.5  AST _3 < > _4 ALT _5 < > _6 ALKPHOS 82 77 66   < > 86 76 80  BILITOT 0.5 0.6 0.6   < > 0.6 0.6 0.3   < > = values in this interval not displayed.    RADIOGRAPHIC STUDIES: I have personally reviewed the radiological images as listed and agreed with the findings in the report. No results found.   ASSESSMENT & PLAN:   Cancer of upper lobe of right lung ([place]) # Stage IV metastatic non-small cell lung cancer favor adeno; mets to bone; liver.  Currently on Keytruda maintenance; MAY 26th, 2022- STABLE;  right upper lobe lesion; slight decrease in the hepatic metastatic disease; stable T3-T4 compression fractures/T1 T6 spinal fusion. Clinically stable.    # Proceed with [place]. Labs today reviewed;  acceptable for treatment today.   # Bone metastases- on zometa 3 mg IVPB.  Every 6 weeks. Ca-8.8.Clinically stable.   # left shoulder pain- [facility]- recommend NSAIDs/Topical. .Clinically stable.   #Debility-spinal cord compression/status post surgery-s/p physical therapy-. Clinically stable.   # sublicnial hypothyroidism- TSH slightlly elevated-6.9; but T3-T4 normal.  Monitor for now. .Clinically  stable.   # COPD-.Clinically stable.  on 2 lit O2; Trelegy.  # DISPOSITION: # Keytruda; zometa # follow up in 3 weeks MD;labs- cbc/cmp; thyroid profile; Keytruda;;Dr.B    All questions were answered. The patient knows to call the clinic with any problems, questions or concerns.    Cammie Sickle,  MD 02/10/2021 9:49 PM

## 2021-02-10 ENCOUNTER — Encounter: Payer: Self-pay | Admitting: Internal Medicine

## 2021-02-25 ENCOUNTER — Inpatient Hospital Stay: Payer: Medicare HMO | Admitting: Internal Medicine

## 2021-02-25 ENCOUNTER — Inpatient Hospital Stay: Payer: Medicare HMO

## 2021-02-25 ENCOUNTER — Encounter: Payer: Self-pay | Admitting: Internal Medicine

## 2021-02-25 ENCOUNTER — Inpatient Hospital Stay: Payer: Medicare HMO | Attending: Internal Medicine

## 2021-02-25 DIAGNOSIS — C3411 Malignant neoplasm of upper lobe, right bronchus or lung: Secondary | ICD-10-CM | POA: Diagnosis not present

## 2021-02-25 DIAGNOSIS — C787 Secondary malignant neoplasm of liver and intrahepatic bile duct: Secondary | ICD-10-CM | POA: Diagnosis not present

## 2021-02-25 DIAGNOSIS — Z87891 Personal history of nicotine dependence: Secondary | ICD-10-CM | POA: Insufficient documentation

## 2021-02-25 DIAGNOSIS — J449 Chronic obstructive pulmonary disease, unspecified: Secondary | ICD-10-CM | POA: Diagnosis not present

## 2021-02-25 DIAGNOSIS — M2550 Pain in unspecified joint: Secondary | ICD-10-CM | POA: Insufficient documentation

## 2021-02-25 DIAGNOSIS — R5383 Other fatigue: Secondary | ICD-10-CM | POA: Insufficient documentation

## 2021-02-25 DIAGNOSIS — Z5112 Encounter for antineoplastic immunotherapy: Secondary | ICD-10-CM | POA: Diagnosis not present

## 2021-02-25 DIAGNOSIS — Z79899 Other long term (current) drug therapy: Secondary | ICD-10-CM | POA: Insufficient documentation

## 2021-02-25 DIAGNOSIS — R5381 Other malaise: Secondary | ICD-10-CM | POA: Insufficient documentation

## 2021-02-25 DIAGNOSIS — C7951 Secondary malignant neoplasm of bone: Secondary | ICD-10-CM | POA: Insufficient documentation

## 2021-02-25 DIAGNOSIS — M4854XA Collapsed vertebra, not elsewhere classified, thoracic region, initial encounter for fracture: Secondary | ICD-10-CM | POA: Insufficient documentation

## 2021-02-25 DIAGNOSIS — M25512 Pain in left shoulder: Secondary | ICD-10-CM | POA: Insufficient documentation

## 2021-02-25 DIAGNOSIS — M549 Dorsalgia, unspecified: Secondary | ICD-10-CM | POA: Insufficient documentation

## 2021-02-25 DIAGNOSIS — E039 Hypothyroidism, unspecified: Secondary | ICD-10-CM | POA: Insufficient documentation

## 2021-02-25 DIAGNOSIS — Z8249 Family history of ischemic heart disease and other diseases of the circulatory system: Secondary | ICD-10-CM | POA: Diagnosis not present

## 2021-02-25 DIAGNOSIS — Z981 Arthrodesis status: Secondary | ICD-10-CM | POA: Diagnosis not present

## 2021-02-25 DIAGNOSIS — Z7289 Other problems related to lifestyle: Secondary | ICD-10-CM | POA: Diagnosis not present

## 2021-02-25 DIAGNOSIS — Z801 Family history of malignant neoplasm of trachea, bronchus and lung: Secondary | ICD-10-CM | POA: Diagnosis not present

## 2021-02-25 DIAGNOSIS — Z7189 Other specified counseling: Secondary | ICD-10-CM

## 2021-02-25 DIAGNOSIS — Z9981 Dependence on supplemental oxygen: Secondary | ICD-10-CM | POA: Insufficient documentation

## 2021-02-25 DIAGNOSIS — N400 Enlarged prostate without lower urinary tract symptoms: Secondary | ICD-10-CM | POA: Diagnosis not present

## 2021-02-25 DIAGNOSIS — R0602 Shortness of breath: Secondary | ICD-10-CM | POA: Diagnosis not present

## 2021-02-25 LAB — COMPREHENSIVE METABOLIC PANEL
ALT: 12 U/L (ref 0–44)
AST: 21 U/L (ref 15–41)
Albumin: 3.6 g/dL (ref 3.5–5.0)
Alkaline Phosphatase: 74 U/L (ref 38–126)
Anion gap: 10 (ref 5–15)
BUN: 12 mg/dL (ref 8–23)
CO2: 30 mmol/L (ref 22–32)
Calcium: 8.6 mg/dL — ABNORMAL LOW (ref 8.9–10.3)
Chloride: 95 mmol/L — ABNORMAL LOW (ref 98–111)
Creatinine, Ser: 0.48 mg/dL — ABNORMAL LOW (ref 0.61–1.24)
GFR, Estimated: >60 mL/min (ref >60)
Glucose, Bld: 101 mg/dL — ABNORMAL HIGH (ref 70–99)
Potassium: 3.9 mmol/L (ref 3.5–5.1)
Sodium: 135 mmol/L (ref 135–145)
Total Bilirubin: 0.3 mg/dL (ref 0.3–1.2)
Total Protein: 7.4 g/dL (ref 6.5–8.1)

## 2021-02-25 LAB — CBC WITH DIFFERENTIAL/PLATELET
Abs Immature Granulocytes: 0.02 10*3/uL (ref 0.00–0.07)
Basophils Absolute: 0.1 10*3/uL (ref 0.0–0.1)
Basophils Relative: 1 %
Eosinophils Absolute: 0.4 10*3/uL (ref 0.0–0.5)
Eosinophils Relative: 7 %
HCT: 44 % (ref 39.0–52.0)
Hemoglobin: 14.1 g/dL (ref 13.0–17.0)
Immature Granulocytes: 0 %
Lymphocytes Relative: 18 %
Lymphs Abs: 1.1 10*3/uL (ref 0.7–4.0)
MCH: 29.7 pg (ref 26.0–34.0)
MCHC: 32 g/dL (ref 30.0–36.0)
MCV: 92.6 fL (ref 80.0–100.0)
Monocytes Absolute: 0.8 10*3/uL (ref 0.1–1.0)
Monocytes Relative: 12 %
Neutro Abs: 3.9 10*3/uL (ref 1.7–7.7)
Neutrophils Relative %: 62 %
Platelets: 253 10*3/uL (ref 150–400)
RBC: 4.75 MIL/uL (ref 4.22–5.81)
RDW: 12.7 % (ref 11.5–15.5)
WBC: 6.3 10*3/uL (ref 4.0–10.5)
nRBC: 0 % (ref 0.0–0.2)

## 2021-02-25 MED ORDER — SODIUM CHLORIDE 0.9% FLUSH
10.0000 mL | Freq: Once | INTRAVENOUS | Status: AC
  Administered 2021-02-25: 10 mL via INTRAVENOUS
  Filled 2021-02-25: qty 10

## 2021-02-25 MED ORDER — SODIUM CHLORIDE 0.9 % IV SOLN
200.0000 mg | Freq: Once | INTRAVENOUS | Status: AC
  Administered 2021-02-25: 200 mg via INTRAVENOUS
  Filled 2021-02-25: qty 8

## 2021-02-25 MED ORDER — SODIUM CHLORIDE 0.9 % IV SOLN
Freq: Once | INTRAVENOUS | Status: AC
  Filled 2021-02-25: qty 250

## 2021-02-25 MED ORDER — HEPARIN SOD (PORK) LOCK FLUSH 100 UNIT/ML IV SOLN
500.0000 [IU] | Freq: Once | INTRAVENOUS | Status: AC
  Administered 2021-02-25: 500 [IU] via INTRAVENOUS
  Filled 2021-02-25: qty 5

## 2021-02-25 MED ORDER — HEPARIN SOD (PORK) LOCK FLUSH 100 UNIT/ML IV SOLN
500.0000 [IU] | Freq: Once | INTRAVENOUS | Status: DC | PRN
  Filled 2021-02-25: qty 5

## 2021-02-25 MED ORDER — HEPARIN SOD (PORK) LOCK FLUSH 100 UNIT/ML IV SOLN
INTRAVENOUS | Status: AC
  Filled 2021-02-25: qty 5

## 2021-02-25 NOTE — Assessment & Plan Note (Addendum)
#   Stage IV metastatic non-small cell lung cancer favor adeno; mets to bone; liver.  Currently on Keytruda maintenance; MAY 26th, 2022- STABLE;  right upper lobe lesion; slight decrease in the hepatic metastatic disease; stable T3-T4 compression fractures/T1 T6 spinal fusion. STABLE.    # Proceed with [place]. Labs today reviewed;  acceptable for treatment today.   # Bone metastases- on zometa 3 mg IVPB.  Every 6 weeks. Ca-8.8.Clinically- STABLE.   # left shoulder pain- [facility]- recommend NSAIDs/Topical. .Clinically- STABLE.   #Debility-spinal cord compression/status post surgery-s/p physical therapy-. Clinically stable.   # sublicnial hypothyroidism- TSH slightlly elevated-6.9 [may 7342]; await profile from today.   Monitor for now. .Clinically STABLE. Discussed re: synthroid.   # COPD-.Clinically- STABLE-  on 2 lit O2; Trelegy.  # DISPOSITION: # Keytruda;  # follow up in 3 weeks MD;labs- cbc/cmp; Keytruda;zometa;Dr.B

## 2021-02-25 NOTE — Progress Notes (Signed)
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   # MAY 26th-Keytrda x2 cycles; [borderline PS] July 7th-carbo Alimta Keytruda cycle #1  # NGS/MOLECULAR TESTS:   # PALLIATIVE CARE EVALUATION:  # PAIN MANAGEMENT:    DIAGNOSIS:   STAGE:         ;  GOALS:  CURRENT/MOST RECENT THERAPY :     Cancer of upper lobe of right lung ([facility])  11/23/2019 Initial Diagnosis   Cancer of upper lobe of right lung ([place])   12/21/2019 -  Chemotherapy    Patient is on Treatment Plan: LUNG CARBOPLATIN / PEMETREXED / PEMBROLIZUMAB Q21D INDUCTION X 4 CYCLES / MAINTENANCE PEMETREXED + PEMBROLIZUMAB        HISTORY OF PRESENTING ILLNESS:  Cody [name] 65 y.o.  male patient with metastatic non-small cell lung cancer; cord compression status post decompressive surgery; currently on Keytruda maintenance-is here for follow-up.  Patient denies any worsening shortness of breath or cough.  He continues to be on 24 x 7 oxygen.  No headaches.  No nausea no vomiting no diarrhea.  No weight loss.  Review of Systems  Constitutional:  Positive for malaise/fatigue. Negative for chills, diaphoresis and fever.  HENT:  Negative for nosebleeds and sore throat.   Eyes:  Negative for double vision.  Respiratory:  Positive  for shortness of breath. Negative for cough, hemoptysis, sputum production and wheezing.   Cardiovascular:  Negative for chest pain, palpitations, orthopnea and leg swelling.  Gastrointestinal:  Negative for abdominal pain, blood in stool, constipation, heartburn, melena, nausea and vomiting.  Genitourinary:  Negative for dysuria, frequency and urgency.  Musculoskeletal:  Positive for back pain and joint pain.  Skin: Negative.  Negative for itching and rash.  Neurological:  Negative for dizziness, tingling, focal weakness, weakness and headaches.  Endo/Heme/Allergies:  Does not bruise/bleed easily.  Psychiatric/Behavioral:  Negative for depression. The patient is not nervous/anxious and does not have insomnia.     MEDICAL HISTORY:  Past Medical History:  Diagnosis Date   Allergy    Cancer of upper lobe of right lung ([facility]) 10/2019   COPD (chronic obstructive pulmonary disease) (HCC)    Prostate enlargement     SURGICAL HISTORY: Past Surgical History:  Procedure Laterality Date   HERNIA REPAIR Bilateral 1982   HERNIA REPAIR Right 1994   IR IMAGING GUIDED PORT INSERTION  02/24/2020   LAMINECTOMY FOR EXCISION / EVACUATION INTRASPINAL LESION  11/07/2019   T1-T 6 at [facility]     SOCIAL HISTORY: Social History   Socioeconomic History   Marital status: Married    Spouse name: [name]    Number of children: 4   Years of education: Not on file   Highest education level: Some college, no degree  Occupational History   Occupation: dispatch and delivery   Tobacco Use   Smoking status: Former    Packs/day: 2.00  Years: 30.00    Pack years: 60.00    Types: Cigarettes    Quit date: 07/29/2011    Years since quitting: 9.5   Smokeless tobacco: Never   Tobacco comments:    pt vapes  Vaping Use   Vaping Use: Not on file  Substance and Sexual Activity   Alcohol use: Yes    Alcohol/week: 5.0 standard drinks    Types: 5 Standard drinks or equivalent per week    Comment: 1-2/day   Drug  use: No   Sexual activity: Not Currently    Partners: Female  Other Topics Concern   Not on file  Social History Narrative   Worked in warehouse/ quit smoking in 2015; beer 1/day; in [place]; with wife.    Social Determinants of Health   Financial Resource Strain: Not on file  Food Insecurity: Not on file  Transportation Needs: Not on file  Physical Activity: Not on file  Stress: Not on file  Social Connections: Not on file  Intimate Partner Violence: Not on file    FAMILY HISTORY: Family History  Problem Relation Age of Onset   Heart disease Father    CVA Mother    Lung cancer Sister    Lung cancer Brother     ALLERGIES:  has No Known Allergies.  MEDICATIONS:  Current Outpatient Medications  Medication Sig Dispense Refill   acetaminophen (TYLENOL) 500 MG tablet Take 500-1,000 mg by mouth every 6 (six) hours as needed for mild pain or fever.      albuterol (VENTOLIN HFA) 108 (90 Base) MCG/ACT inhaler Inhale 2 puffs into the lungs every 4 (four) hours as needed for wheezing or shortness of breath. 18 g 0   Calcium Carb-Cholecalciferol (OYSTER SHELL CALCIUM) 500-400 MG-UNIT TABS Take 500 tablets by mouth.     escitalopram (LEXAPRO) 5 MG tablet Take 1 tablet (5 mg total) by mouth daily. am's 90 tablet 1   Fluticasone-Umeclidin-Vilant (TRELEGY ELLIPTA) 100-62.5-25 MCG/INH AEPB Inhale 1 puff into the lungs daily. 180 each 1   lidocaine-prilocaine (EMLA) cream Apply 1 application topically as needed. Apply small amount to port site at least 1 hour prior to it being accessed, cover with plastic wrap 30 g 1   Multiple Vitamins-Minerals (MULTIVITAMIN GUMMIES MENS PO) Take 1 Dose by mouth daily.      QUEtiapine (SEROQUEL) 25 MG tablet Take 1 tablet (25 mg total) by mouth at bedtime. 90 tablet 1   ondansetron (ZOFRAN) 4 MG tablet Take 1 tablet (4 mg total) by mouth every 8 (eight) hours as needed for nausea or vomiting. (Patient not taking: Reported on 02/25/2021) 20 tablet 2    prochlorperazine (COMPAZINE) 10 MG tablet Take 1 tablet (10 mg total) by mouth every 6 (six) hours as needed for nausea or vomiting. (Patient not taking: Reported on 02/25/2021) 40 tablet 1   No current facility-administered medications for this visit.   Facility-Administered Medications Ordered in Other Visits  Medication Dose Route Frequency Provider Last Rate Last Admin   heparin lock flush 100 unit/mL  500 Units Intravenous Once Charlaine Dalton R, MD       heparin lock flush 100 unit/mL  500 Units Intracatheter Once PRN Cammie Sickle, MD       pembrolizumab [facility]) 200 mg in sodium chloride 0.9 % 50 mL chemo infusion  200 mg Intravenous Once Cammie Sickle, MD 116 mL/hr at 02/25/21 1000 200 mg at 02/25/21 1000      .  PHYSICAL EXAMINATION: ECOG PERFORMANCE STATUS:  3 - Symptomatic, >50% confined to bed  Vitals:   02/25/21 0844  BP: 124/77  Pulse: 81  Resp: 20  Temp: (!) 97.4 F (36.3 C)  SpO2: 100%   Filed Weights   02/25/21 0844  Weight: 121 lb (54.9 kg)    Physical Exam Constitutional:      Comments: Thin built frail appearing male patient.  No acute distress.  Accompanied by his family.   HENT:     Head: Normocephalic and atraumatic.     Mouth/Throat:     Pharynx: No oropharyngeal exudate.  Eyes:     Pupils: Pupils are equal, round, and reactive to light.  Cardiovascular:     Rate and Rhythm: Normal rate and regular rhythm.  Pulmonary:     Effort: No respiratory distress.     Breath sounds: No wheezing.     Comments: Decreased air entry bilaterally. Abdominal:     General: Bowel sounds are normal. There is no distension.     Palpations: Abdomen is soft. There is no mass.     Tenderness: There is no abdominal tenderness. There is no guarding or rebound.  Musculoskeletal:        General: No tenderness. Normal range of motion.     Cervical back: Normal range of motion and neck supple.  Skin:    General: Skin is warm.  Neurological:      Mental Status: He is alert and oriented to person, place, and time.  Psychiatric:        Mood and Affect: Affect normal.     LABORATORY DATA:  I have reviewed the data as listed Lab Results  Component Value Date   WBC 6.3 02/25/2021   HGB 14.1 02/25/2021   HCT 44.0 02/25/2021   MCV 92.6 02/25/2021   PLT 253 02/25/2021   Recent Labs    03/14/20 0831 04/04/20 0827 04/25/20 0834 05/16/20 0824 01/15/21 0907 02/04/21 0834 02/25/21 0835  NA 139 137 139   < > 136 136 135  K 3.9 4.3 4.2   < > 4.0 4.2 3.9  CL 103 99 102   < > 90* 99 95*  CO2 29 28 28    < > 37* 33* 30  GLUCOSE 104* 181* 102*   < > 101* 114* 101*  BUN 19 19 15    < > 10 15 12   CREATININE 0.56* 0.61 0.60*   < > 0.53* 0.49* 0.48*  CALCIUM 8.8* 8.8* 8.8*   < > 8.9 8.4* 8.6*  GFRNONAA >60 >60 >60   < > >60 >60 >60  GFRAA >60 >60 >60  --   --   --   --   PROT 6.9 6.7 6.4*   < > 7.4 7.4 7.4  ALBUMIN 3.8 3.7 3.7   < > 3.5 3.5 3.6  AST 24 29 23    < > 19 22 21   ALT 20 20 19    < > 11 12 12   ALKPHOS 82 77 66   < > 76 80 74  BILITOT 0.5 0.6 0.6   < > 0.6 0.3 0.3   < > = values in this interval not displayed.    RADIOGRAPHIC STUDIES: I have personally reviewed the radiological images as listed and agreed with the findings in the report. No results found.   ASSESSMENT & PLAN:   Cancer of upper lobe of right lung ([facility]) # Stage IV metastatic non-small cell lung cancer favor adeno; mets to bone; liver.  Currently on Keytruda maintenance; MAY  26th, 2022- STABLE;  right upper lobe lesion; slight decrease in the hepatic metastatic disease; stable T3-T4 compression fractures/T1 T6 spinal fusion. STABLE.    # Proceed with [place]. Labs today reviewed;  acceptable for treatment today.   # Bone metastases- on zometa 3 mg IVPB.  Every 6 weeks. Ca-8.8.Clinically- STABLE.   # left shoulder pain- [facility]- recommend NSAIDs/Topical. .Clinically- STABLE.   #Debility-spinal cord compression/status post surgery-s/p physical therapy-.  Clinically stable.   # sublicnial hypothyroidism- TSH slightlly elevated-6.9 [may 3202]; await profile from today.   Monitor for now. .Clinically STABLE. Discussed re: synthroid.   # COPD-.Clinically- STABLE-  on 2 lit O2; Trelegy.  # DISPOSITION: # Keytruda;  # follow up in 3 weeks MD;labs- cbc/cmp; Keytruda;zometa;Dr.B  All questions were answered. The patient knows to call the clinic with any problems, questions or concerns.    Cammie Sickle, MD 02/25/2021 10:01 AM

## 2021-02-25 NOTE — Patient Instructions (Signed)
[facility] ONCOLOGY  Discharge Instructions: Thank you for choosing [facility] to provide your oncology and hematology care.  If you have a lab appointment with the [facility], please go directly to the [facility] and check in at the registration area.  Wear comfortable clothing and clothing appropriate for easy access to any Portacath or PICC line.   We strive to give you quality time with your provider. You may need to reschedule your appointment if you arrive late (15 or more minutes).  Arriving late affects you and other patients whose appointments are after yours.  Also, if you miss three or more appointments without notifying the office, you may be dismissed from the clinic at the provider's discretion.      For prescription refill requests, have your pharmacy contact our office and allow 72 hours for refills to be completed.    Today you received the following chemotherapy and/or immunotherapy agents Keytruda      To help prevent nausea and vomiting after your treatment, we encourage you to take your nausea medication as directed.  BELOW ARE SYMPTOMS THAT SHOULD BE REPORTED IMMEDIATELY: *FEVER GREATER THAN 100.4 F (38 C) OR HIGHER *CHILLS OR SWEATING *NAUSEA AND VOMITING THAT IS NOT CONTROLLED WITH YOUR NAUSEA MEDICATION *UNUSUAL SHORTNESS OF BREATH *UNUSUAL BRUISING OR BLEEDING *URINARY PROBLEMS (pain or burning when urinating, or frequent urination) *BOWEL PROBLEMS (unusual diarrhea, constipation, pain near the anus) TENDERNESS IN MOUTH AND THROAT WITH OR WITHOUT PRESENCE OF ULCERS (sore throat, sores in mouth, or a toothache) UNUSUAL RASH, SWELLING OR PAIN  UNUSUAL VAGINAL DISCHARGE OR ITCHING   Items with * indicate a potential emergency and should be followed up as soon as possible or go to the Emergency Department if any problems should occur.  Please show the CHEMOTHERAPY ALERT CARD or IMMUNOTHERAPY ALERT CARD at check-in to  the Emergency Department and triage nurse.  Should you have questions after your visit or need to cancel or reschedule your appointment, please contact [facility]  [phone] and follow the prompts.  Office hours are 8:00 a.m. to 4:30 p.m. Monday - Friday. Please note that voicemails left after 4:00 p.m. may not be returned until the following business day.  We are closed weekends and major holidays. You have access to a nurse at all times for urgent questions. Please call the main number to the clinic [phone] and follow the prompts.  For any non-urgent questions, you may also contact your provider using MyChart. We now offer e-Visits for anyone 15 and older to request care online for non-urgent symptoms. For details visit [email or web address].   Also download the MyChart app! Go to the app store, search "MyChart", open the app, select [facility], and log in with your MyChart username and password.  Due to Covid, a mask is required upon entering the hospital/clinic. If you do not have a mask, one will be given to you upon arrival. For doctor visits, patients may have 1 support person aged 62 or older with them. For treatment visits, patients cannot have anyone with them due to current Covid guidelines and our immunocompromised population.

## 2021-02-26 LAB — THYROID PANEL WITH TSH
Free Thyroxine Index: 1.8 (ref 1.2–4.9)
T3 Uptake Ratio: 28 % (ref 24–39)
T4, Total: 6.5 ug/dL (ref 4.5–12.0)
TSH: 9.32 u[IU]/mL — ABNORMAL HIGH (ref 0.450–4.500)

## 2021-03-01 DIAGNOSIS — J441 Chronic obstructive pulmonary disease with (acute) exacerbation: Secondary | ICD-10-CM | POA: Diagnosis not present

## 2021-03-01 DIAGNOSIS — J9601 Acute respiratory failure with hypoxia: Secondary | ICD-10-CM | POA: Diagnosis not present

## 2021-03-18 ENCOUNTER — Other Ambulatory Visit: Payer: Self-pay

## 2021-03-18 ENCOUNTER — Encounter: Payer: Self-pay | Admitting: Internal Medicine

## 2021-03-18 ENCOUNTER — Inpatient Hospital Stay: Payer: Medicare HMO | Admitting: Internal Medicine

## 2021-03-18 ENCOUNTER — Inpatient Hospital Stay: Payer: Medicare HMO

## 2021-03-18 DIAGNOSIS — M2550 Pain in unspecified joint: Secondary | ICD-10-CM | POA: Diagnosis not present

## 2021-03-18 DIAGNOSIS — C3411 Malignant neoplasm of upper lobe, right bronchus or lung: Secondary | ICD-10-CM

## 2021-03-18 DIAGNOSIS — Z7189 Other specified counseling: Secondary | ICD-10-CM

## 2021-03-18 DIAGNOSIS — M549 Dorsalgia, unspecified: Secondary | ICD-10-CM | POA: Diagnosis not present

## 2021-03-18 DIAGNOSIS — C7951 Secondary malignant neoplasm of bone: Secondary | ICD-10-CM | POA: Diagnosis not present

## 2021-03-18 DIAGNOSIS — R0602 Shortness of breath: Secondary | ICD-10-CM | POA: Diagnosis not present

## 2021-03-18 DIAGNOSIS — J449 Chronic obstructive pulmonary disease, unspecified: Secondary | ICD-10-CM | POA: Diagnosis not present

## 2021-03-18 DIAGNOSIS — M4854XA Collapsed vertebra, not elsewhere classified, thoracic region, initial encounter for fracture: Secondary | ICD-10-CM | POA: Diagnosis not present

## 2021-03-18 DIAGNOSIS — Z5112 Encounter for antineoplastic immunotherapy: Secondary | ICD-10-CM | POA: Diagnosis not present

## 2021-03-18 DIAGNOSIS — C787 Secondary malignant neoplasm of liver and intrahepatic bile duct: Secondary | ICD-10-CM | POA: Diagnosis not present

## 2021-03-18 DIAGNOSIS — R5383 Other fatigue: Secondary | ICD-10-CM | POA: Diagnosis not present

## 2021-03-18 LAB — COMPREHENSIVE METABOLIC PANEL
ALT: 11 U/L (ref 0–44)
AST: 21 U/L (ref 15–41)
Albumin: 3.6 g/dL (ref 3.5–5.0)
Alkaline Phosphatase: 70 U/L (ref 38–126)
Anion gap: 7 (ref 5–15)
BUN: 14 mg/dL (ref 8–23)
CO2: 32 mmol/L (ref 22–32)
Calcium: 8.5 mg/dL — ABNORMAL LOW (ref 8.9–10.3)
Chloride: 96 mmol/L — ABNORMAL LOW (ref 98–111)
Creatinine, Ser: 0.53 mg/dL — ABNORMAL LOW (ref 0.61–1.24)
GFR, Estimated: >60 mL/min (ref >60)
Glucose, Bld: 102 mg/dL — ABNORMAL HIGH (ref 70–99)
Potassium: 4.1 mmol/L (ref 3.5–5.1)
Sodium: 135 mmol/L (ref 135–145)
Total Bilirubin: 0.8 mg/dL (ref 0.3–1.2)
Total Protein: 7.6 g/dL (ref 6.5–8.1)

## 2021-03-18 LAB — CBC WITH DIFFERENTIAL/PLATELET
Abs Immature Granulocytes: 0.03 10*3/uL (ref 0.00–0.07)
Basophils Absolute: 0.1 10*3/uL (ref 0.0–0.1)
Basophils Relative: 1 %
Eosinophils Absolute: 0.5 10*3/uL (ref 0.0–0.5)
Eosinophils Relative: 7 %
HCT: 44.8 % (ref 39.0–52.0)
Hemoglobin: 14.2 g/dL (ref 13.0–17.0)
Immature Granulocytes: 0 %
Lymphocytes Relative: 17 %
Lymphs Abs: 1.2 10*3/uL (ref 0.7–4.0)
MCH: 29.8 pg (ref 26.0–34.0)
MCHC: 31.7 g/dL (ref 30.0–36.0)
MCV: 93.9 fL (ref 80.0–100.0)
Monocytes Absolute: 0.8 10*3/uL (ref 0.1–1.0)
Monocytes Relative: 12 %
Neutro Abs: 4.6 10*3/uL (ref 1.7–7.7)
Neutrophils Relative %: 63 %
Platelets: 242 10*3/uL (ref 150–400)
RBC: 4.77 MIL/uL (ref 4.22–5.81)
RDW: 12.9 % (ref 11.5–15.5)
WBC: 7.1 10*3/uL (ref 4.0–10.5)
nRBC: 0 % (ref 0.0–0.2)

## 2021-03-18 MED ORDER — SODIUM CHLORIDE 0.9 % IV SOLN
Freq: Once | INTRAVENOUS | Status: AC
  Filled 2021-03-18: qty 250

## 2021-03-18 MED ORDER — SODIUM CHLORIDE 0.9 % IV SOLN
200.0000 mg | Freq: Once | INTRAVENOUS | Status: AC
  Administered 2021-03-18: 200 mg via INTRAVENOUS
  Filled 2021-03-18: qty 8

## 2021-03-18 MED ORDER — HEPARIN SOD (PORK) LOCK FLUSH 100 UNIT/ML IV SOLN
INTRAVENOUS | Status: AC
  Filled 2021-03-18: qty 5

## 2021-03-18 MED ORDER — SODIUM CHLORIDE 0.9% FLUSH
10.0000 mL | Freq: Once | INTRAVENOUS | Status: AC
  Administered 2021-03-18: 10 mL via INTRAVENOUS
  Filled 2021-03-18: qty 10

## 2021-03-18 NOTE — Patient Instructions (Signed)
[facility] ONCOLOGY  Discharge Instructions: Thank you for choosing [facility] to provide your oncology and hematology care.  If you have a lab appointment with the [facility], please go directly to the [facility] and check in at the registration area.  Wear comfortable clothing and clothing appropriate for easy access to any Portacath or PICC line.   We strive to give you quality time with your provider. You may need to reschedule your appointment if you arrive late (15 or more minutes).  Arriving late affects you and other patients whose appointments are after yours.  Also, if you miss three or more appointments without notifying the office, you may be dismissed from the clinic at the provider's discretion.      For prescription refill requests, have your pharmacy contact our office and allow 72 hours for refills to be completed.    Today you received the following chemotherapy and/or immunotherapy agents : Keytruda   To help prevent nausea and vomiting after your treatment, we encourage you to take your nausea medication as directed.  BELOW ARE SYMPTOMS THAT SHOULD BE REPORTED IMMEDIATELY: *FEVER GREATER THAN 100.4 F (38 C) OR HIGHER *CHILLS OR SWEATING *NAUSEA AND VOMITING THAT IS NOT CONTROLLED WITH YOUR NAUSEA MEDICATION *UNUSUAL SHORTNESS OF BREATH *UNUSUAL BRUISING OR BLEEDING *URINARY PROBLEMS (pain or burning when urinating, or frequent urination) *BOWEL PROBLEMS (unusual diarrhea, constipation, pain near the anus) TENDERNESS IN MOUTH AND THROAT WITH OR WITHOUT PRESENCE OF ULCERS (sore throat, sores in mouth, or a toothache) UNUSUAL RASH, SWELLING OR PAIN  UNUSUAL VAGINAL DISCHARGE OR ITCHING   Items with * indicate a potential emergency and should be followed up as soon as possible or go to the Emergency Department if any problems should occur.  Please show the CHEMOTHERAPY ALERT CARD or IMMUNOTHERAPY ALERT CARD at check-in to  the Emergency Department and triage nurse.  Should you have questions after your visit or need to cancel or reschedule your appointment, please contact [facility]  [phone] and follow the prompts.  Office hours are 8:00 a.m. to 4:30 p.m. Monday - Friday. Please note that voicemails left after 4:00 p.m. may not be returned until the following business day.  We are closed weekends and major holidays. You have access to a nurse at all times for urgent questions. Please call the main number to the clinic [phone] and follow the prompts.  For any non-urgent questions, you may also contact your provider using MyChart. We now offer e-Visits for anyone 44 and older to request care online for non-urgent symptoms. For details visit [email or web address].   Also download the MyChart app! Go to the app store, search "MyChart", open the app, select [facility], and log in with your MyChart username and password.  Due to Covid, a mask is required upon entering the hospital/clinic. If you do not have a mask, one will be given to you upon arrival. For doctor visits, patients may have 1 support person aged 39 or older with them. For treatment visits, patients cannot have anyone with them due to current Covid guidelines and our immunocompromised population.

## 2021-03-18 NOTE — Progress Notes (Signed)
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   # MAY 26th-Keytrda x2 cycles; [borderline PS] July 7th-carbo Alimta Keytruda cycle #1  # NGS/MOLECULAR TESTS:   # PALLIATIVE CARE EVALUATION:  # PAIN MANAGEMENT:    DIAGNOSIS:   STAGE:         ;  GOALS:  CURRENT/MOST RECENT THERAPY :     Cancer of upper lobe of right lung ([place])  11/23/2019 Initial Diagnosis   Cancer of upper lobe of right lung ([place])   12/21/2019 -  Chemotherapy    Patient is on Treatment Plan: LUNG CARBOPLATIN / PEMETREXED / PEMBROLIZUMAB Q21D INDUCTION X 4 CYCLES / MAINTENANCE PEMETREXED + PEMBROLIZUMAB        HISTORY OF PRESENTING ILLNESS:  Cody [name] 65 y.o.  male patient with metastatic non-small cell lung cancer; cord compression status post decompressive surgery; currently on Keytruda maintenance-is here for follow-up.  Denies any nausea vomiting.  He continues with oxygen.  Review of Systems  Constitutional:  Positive for malaise/fatigue. Negative for chills, diaphoresis and fever.  HENT:  Negative for nosebleeds and sore throat.   Eyes:  Negative for double vision.  Respiratory:  Positive for shortness of breath. Negative for cough, hemoptysis, sputum production and wheezing.   Cardiovascular:   Negative for chest pain, palpitations, orthopnea and leg swelling.  Gastrointestinal:  Negative for abdominal pain, blood in stool, constipation, heartburn, melena, nausea and vomiting.  Genitourinary:  Negative for dysuria, frequency and urgency.  Musculoskeletal:  Positive for back pain and joint pain.  Skin: Negative.  Negative for itching and rash.  Neurological:  Negative for dizziness, tingling, focal weakness, weakness and headaches.  Endo/Heme/Allergies:  Does not bruise/bleed easily.  Psychiatric/Behavioral:  Negative for depression. The patient is not nervous/anxious and does not have insomnia.     MEDICAL HISTORY:  Past Medical History:  Diagnosis Date   Allergy    Cancer of upper lobe of right lung ([place]) 10/2019   COPD (chronic obstructive pulmonary disease) (HCC)    Prostate enlargement     SURGICAL HISTORY: Past Surgical History:  Procedure Laterality Date   HERNIA REPAIR Bilateral 1982   HERNIA REPAIR Right 1994   IR IMAGING GUIDED PORT INSERTION  02/24/2020   LAMINECTOMY FOR EXCISION / EVACUATION INTRASPINAL LESION  11/07/2019   T1-T 6 at [facility]     SOCIAL HISTORY: Social History   Socioeconomic History   Marital status: Married    Spouse name: [name]    Number of children: 4   Years of education: Not on file   Highest education level: Some college, no degree  Occupational History   Occupation: dispatch and delivery   Tobacco Use   Smoking status: Former    Packs/day: 2.00    Years: 30.00    Pack years: 60.00    Types: Cigarettes    Quit date: 07/29/2011    Years  since quitting: 9.6   Smokeless tobacco: Never   Tobacco comments:    pt vapes  Vaping Use   Vaping Use: Not on file  Substance and Sexual Activity   Alcohol use: Yes    Alcohol/week: 5.0 standard drinks    Types: 5 Standard drinks or equivalent per week    Comment: 1-2/day   Drug use: No   Sexual activity: Not Currently    Partners: Female  Other Topics Concern   Not on file  Social  History Narrative   Worked in warehouse/ quit smoking in 2015; beer 1/day; in [place]; with wife.    Social Determinants of Health   Financial Resource Strain: Not on file  Food Insecurity: Not on file  Transportation Needs: Not on file  Physical Activity: Not on file  Stress: Not on file  Social Connections: Not on file  Intimate Partner Violence: Not on file    FAMILY HISTORY: Family History  Problem Relation Age of Onset   Heart disease Father    CVA Mother    Lung cancer Sister    Lung cancer Brother     ALLERGIES:  has No Known Allergies.  MEDICATIONS:  Current Outpatient Medications  Medication Sig Dispense Refill   acetaminophen (TYLENOL) 500 MG tablet Take 500-1,000 mg by mouth every 6 (six) hours as needed for mild pain or fever.      albuterol (VENTOLIN HFA) 108 (90 Base) MCG/ACT inhaler Inhale 2 puffs into the lungs every 4 (four) hours as needed for wheezing or shortness of breath. 18 g 0   Calcium Carb-Cholecalciferol (OYSTER SHELL CALCIUM) 500-400 MG-UNIT TABS Take 500 tablets by mouth.     escitalopram (LEXAPRO) 5 MG tablet Take 1 tablet (5 mg total) by mouth daily. am's 90 tablet 1   Fluticasone-Umeclidin-Vilant (TRELEGY ELLIPTA) 100-62.5-25 MCG/INH AEPB Inhale 1 puff into the lungs daily. 180 each 1   lidocaine-prilocaine (EMLA) cream Apply 1 application topically as needed. Apply small amount to port site at least 1 hour prior to it being accessed, cover with plastic wrap 30 g 1   Multiple Vitamins-Minerals (MULTIVITAMIN GUMMIES MENS PO) Take 1 Dose by mouth daily.      QUEtiapine (SEROQUEL) 25 MG tablet Take 1 tablet (25 mg total) by mouth at bedtime. 90 tablet 1   ondansetron (ZOFRAN) 4 MG tablet Take 1 tablet (4 mg total) by mouth every 8 (eight) hours as needed for nausea or vomiting. (Patient not taking: No sig reported) 20 tablet 2   prochlorperazine (COMPAZINE) 10 MG tablet Take 1 tablet (10 mg total) by mouth every 6 (six) hours as needed for nausea  or vomiting. (Patient not taking: No sig reported) 40 tablet 1   No current facility-administered medications for this visit.   Facility-Administered Medications Ordered in Other Visits  Medication Dose Route Frequency Provider Last Rate Last Admin   pembrolizumab (KEYTRUDA) 200 mg in sodium chloride 0.9 % 50 mL chemo infusion  200 mg Intravenous Once Charlaine Dalton R, MD          .  PHYSICAL EXAMINATION: ECOG PERFORMANCE STATUS: 3 - Symptomatic, >50% confined to bed  Vitals:   03/18/21 0916  BP: 128/81  Pulse: 84  Resp: 20  Temp: (!) 96.3 F (35.7 C)  SpO2: 100%   Filed Weights   03/18/21 0916  Weight: 122 lb (55.3 kg)    Physical Exam Constitutional:      Comments: Thin built frail appearing male patient.  No acute distress.  Accompanied  by his family.   HENT:     Head: Normocephalic and atraumatic.     Mouth/Throat:     Pharynx: No oropharyngeal exudate.  Eyes:     Pupils: Pupils are equal, round, and reactive to light.  Cardiovascular:     Rate and Rhythm: Normal rate and regular rhythm.  Pulmonary:     Effort: No respiratory distress.     Breath sounds: No wheezing.     Comments: Decreased air entry bilaterally. Abdominal:     General: Bowel sounds are normal. There is no distension.     Palpations: Abdomen is soft. There is no mass.     Tenderness: There is no abdominal tenderness. There is no guarding or rebound.  Musculoskeletal:        General: No tenderness. Normal range of motion.     Cervical back: Normal range of motion and neck supple.  Skin:    General: Skin is warm.  Neurological:     Mental Status: He is alert and oriented to person, place, and time.  Psychiatric:        Mood and Affect: Affect normal.     LABORATORY DATA:  I have reviewed the data as listed Lab Results  Component Value Date   WBC 7.1 03/18/2021   HGB 14.2 03/18/2021   HCT 44.8 03/18/2021   MCV 93.9 03/18/2021   PLT 242 03/18/2021   Recent Labs     04/04/20 0827 04/25/20 0834 05/16/20 0824 02/04/21 0834 02/25/21 0835 03/18/21 0856  NA 137 139   < > 136 135 135  K 4.3 4.2   < > 4.2 3.9 4.1  CL 99 102   < > 99 95* 96*  CO2 28 28   < > 33* 30 32  GLUCOSE 181* 102*   < > 114* 101* 102*  BUN 19 15   < > 15 12 14   CREATININE 0.61 0.60*   < > 0.49* 0.48* 0.53*  CALCIUM 8.8* 8.8*   < > 8.4* 8.6* 8.5*  GFRNONAA >60 >60   < > >60 >60 >60  GFRAA >60 >60  --   --   --   --   PROT 6.7 6.4*   < > 7.4 7.4 7.6  ALBUMIN 3.7 3.7   < > 3.5 3.6 3.6  AST 29 23   < > 22 21 21   ALT 20 19   < > 12 12 11   ALKPHOS 77 66   < > 80 74 70  BILITOT 0.6 0.6   < > 0.3 0.3 0.8   < > = values in this interval not displayed.    RADIOGRAPHIC STUDIES: I have personally reviewed the radiological images as listed and agreed with the findings in the report. No results found.   ASSESSMENT & PLAN:   Cancer of upper lobe of right lung ([place]) # Stage IV metastatic non-small cell lung cancer favor adeno; mets to bone; liver.  Currently on Keytruda maintenance; MAY 26th, 2022- STABLE;  right upper lobe lesion; slight decrease in the hepatic metastatic disease; stable T3-T4 compression fractures/T1 T6 spinal fusion.  STABLE.    # Proceed with [place]. Labs today reviewed;  acceptable for treatment today.   # Bone metastases- on zometa 3 mg IVPB.  Every 6 weeks. Ca-8.8.Clinically- STABLE.   # left shoulder pain- [facility]- recommend NSAIDs/Topical. .Clinically-STABLE.   #Debility-spinal cord compression/status post surgery-s/p physical therapy-. ClinicallySTABLE.   # sublicnial hypothyroidism- TSH slightlly elevated-9; T4/T3-N [AUG 2022];  Monitor  for now. .Clinically STABLE.  Discussed re: synthroid-HOLD off for now.    # COPD-.Clinically- STABLE.   on 2 lit O2; Trelegy.  # DISPOSITION: # Keytruda; HOLD zometa # follow up in 3 weeks MD;labs- cbc/cmp; Keytruda;Dr.B  All questions were answered. The patient knows to call the clinic with any problems, questions or  concerns.    Cammie Sickle, MD 03/18/2021 10:16 AM

## 2021-03-18 NOTE — Assessment & Plan Note (Signed)
#   Stage IV metastatic non-small cell lung cancer favor adeno; mets to bone; liver.  Currently on Keytruda maintenance; MAY 26th, 2022- STABLE;  right upper lobe lesion; slight decrease in the hepatic metastatic disease; stable T3-T4 compression fractures/T1 T6 spinal fusion.  STABLE.    # Proceed with [place]. Labs today reviewed;  acceptable for treatment today.   # Bone metastases- on zometa 3 mg IVPB.  Every 6 weeks. Ca-8.8.Clinically- STABLE.   # left shoulder pain- [facility]- recommend NSAIDs/Topical. .Clinically-STABLE.   #Debility-spinal cord compression/status post surgery-s/p physical therapy-. ClinicallySTABLE.   # sublicnial hypothyroidism- TSH slightlly elevated-9; T4/T3-N [AUG 2022];  Monitor for now. .Clinically STABLE.  Discussed re: synthroid-HOLD off for now.    # COPD-.Clinically- STABLE.   on 2 lit O2; Trelegy.  # DISPOSITION: # Keytruda; HOLD zometa # follow up in 3 weeks MD;labs- cbc/cmp; Keytruda;Dr.B

## 2021-04-01 DIAGNOSIS — J441 Chronic obstructive pulmonary disease with (acute) exacerbation: Secondary | ICD-10-CM | POA: Diagnosis not present

## 2021-04-01 DIAGNOSIS — J9601 Acute respiratory failure with hypoxia: Secondary | ICD-10-CM | POA: Diagnosis not present

## 2021-04-08 ENCOUNTER — Other Ambulatory Visit: Payer: Self-pay

## 2021-04-08 ENCOUNTER — Inpatient Hospital Stay: Payer: Medicare HMO

## 2021-04-08 ENCOUNTER — Inpatient Hospital Stay: Payer: Medicare HMO | Attending: Internal Medicine | Admitting: Internal Medicine

## 2021-04-08 DIAGNOSIS — C3411 Malignant neoplasm of upper lobe, right bronchus or lung: Secondary | ICD-10-CM | POA: Insufficient documentation

## 2021-04-08 DIAGNOSIS — N400 Enlarged prostate without lower urinary tract symptoms: Secondary | ICD-10-CM | POA: Insufficient documentation

## 2021-04-08 DIAGNOSIS — Z981 Arthrodesis status: Secondary | ICD-10-CM | POA: Diagnosis not present

## 2021-04-08 DIAGNOSIS — Z5112 Encounter for antineoplastic immunotherapy: Secondary | ICD-10-CM | POA: Insufficient documentation

## 2021-04-08 DIAGNOSIS — Z79899 Other long term (current) drug therapy: Secondary | ICD-10-CM | POA: Insufficient documentation

## 2021-04-08 DIAGNOSIS — Z87891 Personal history of nicotine dependence: Secondary | ICD-10-CM | POA: Insufficient documentation

## 2021-04-08 DIAGNOSIS — Z7189 Other specified counseling: Secondary | ICD-10-CM

## 2021-04-08 DIAGNOSIS — M549 Dorsalgia, unspecified: Secondary | ICD-10-CM | POA: Diagnosis not present

## 2021-04-08 DIAGNOSIS — Z801 Family history of malignant neoplasm of trachea, bronchus and lung: Secondary | ICD-10-CM | POA: Insufficient documentation

## 2021-04-08 DIAGNOSIS — E039 Hypothyroidism, unspecified: Secondary | ICD-10-CM | POA: Insufficient documentation

## 2021-04-08 DIAGNOSIS — R0602 Shortness of breath: Secondary | ICD-10-CM | POA: Diagnosis not present

## 2021-04-08 DIAGNOSIS — C787 Secondary malignant neoplasm of liver and intrahepatic bile duct: Secondary | ICD-10-CM | POA: Diagnosis not present

## 2021-04-08 DIAGNOSIS — M4854XA Collapsed vertebra, not elsewhere classified, thoracic region, initial encounter for fracture: Secondary | ICD-10-CM | POA: Diagnosis not present

## 2021-04-08 DIAGNOSIS — R5383 Other fatigue: Secondary | ICD-10-CM | POA: Diagnosis not present

## 2021-04-08 DIAGNOSIS — Z8249 Family history of ischemic heart disease and other diseases of the circulatory system: Secondary | ICD-10-CM | POA: Insufficient documentation

## 2021-04-08 DIAGNOSIS — M2550 Pain in unspecified joint: Secondary | ICD-10-CM | POA: Insufficient documentation

## 2021-04-08 DIAGNOSIS — M25512 Pain in left shoulder: Secondary | ICD-10-CM | POA: Diagnosis not present

## 2021-04-08 DIAGNOSIS — C7951 Secondary malignant neoplasm of bone: Secondary | ICD-10-CM | POA: Insufficient documentation

## 2021-04-08 LAB — CBC WITH DIFFERENTIAL/PLATELET
Abs Immature Granulocytes: 0.02 10*3/uL (ref 0.00–0.07)
Basophils Absolute: 0.1 10*3/uL (ref 0.0–0.1)
Basophils Relative: 1 %
Eosinophils Absolute: 0.4 10*3/uL (ref 0.0–0.5)
Eosinophils Relative: 6 %
HCT: 44.3 % (ref 39.0–52.0)
Hemoglobin: 14.2 g/dL (ref 13.0–17.0)
Immature Granulocytes: 0 %
Lymphocytes Relative: 14 %
Lymphs Abs: 1 10*3/uL (ref 0.7–4.0)
MCH: 29.6 pg (ref 26.0–34.0)
MCHC: 32.1 g/dL (ref 30.0–36.0)
MCV: 92.5 fL (ref 80.0–100.0)
Monocytes Absolute: 0.7 10*3/uL (ref 0.1–1.0)
Monocytes Relative: 10 %
Neutro Abs: 4.8 10*3/uL (ref 1.7–7.7)
Neutrophils Relative %: 69 %
Platelets: 251 10*3/uL (ref 150–400)
RBC: 4.79 MIL/uL (ref 4.22–5.81)
RDW: 12.9 % (ref 11.5–15.5)
WBC: 6.9 10*3/uL (ref 4.0–10.5)
nRBC: 0 % (ref 0.0–0.2)

## 2021-04-08 LAB — COMPREHENSIVE METABOLIC PANEL
ALT: 11 U/L (ref 0–44)
AST: 19 U/L (ref 15–41)
Albumin: 3.6 g/dL (ref 3.5–5.0)
Alkaline Phosphatase: 73 U/L (ref 38–126)
Anion gap: 5 (ref 5–15)
BUN: 10 mg/dL (ref 8–23)
CO2: 34 mmol/L — ABNORMAL HIGH (ref 22–32)
Calcium: 9 mg/dL (ref 8.9–10.3)
Chloride: 97 mmol/L — ABNORMAL LOW (ref 98–111)
Creatinine, Ser: 0.41 mg/dL — ABNORMAL LOW (ref 0.61–1.24)
GFR, Estimated: >60 mL/min (ref >60)
Glucose, Bld: 111 mg/dL — ABNORMAL HIGH (ref 70–99)
Potassium: 4.1 mmol/L (ref 3.5–5.1)
Sodium: 136 mmol/L (ref 135–145)
Total Bilirubin: 0.6 mg/dL (ref 0.3–1.2)
Total Protein: 7.4 g/dL (ref 6.5–8.1)

## 2021-04-08 MED ORDER — HEPARIN SOD (PORK) LOCK FLUSH 100 UNIT/ML IV SOLN
500.0000 [IU] | Freq: Once | INTRAVENOUS | Status: AC
  Filled 2021-04-08: qty 5

## 2021-04-08 MED ORDER — SODIUM CHLORIDE 0.9 % IV SOLN
200.0000 mg | Freq: Once | INTRAVENOUS | Status: AC
  Administered 2021-04-08: 200 mg via INTRAVENOUS
  Filled 2021-04-08: qty 8

## 2021-04-08 MED ORDER — LEVOTHYROXINE SODIUM 25 MCG PO TABS: 25.0000 ug | ORAL_TABLET | Freq: Every day | ORAL | 3 refills | Status: DC

## 2021-04-08 MED ORDER — SODIUM CHLORIDE 0.9% FLUSH
10.0000 mL | Freq: Once | INTRAVENOUS | Status: AC
  Administered 2021-04-08: 10 mL via INTRAVENOUS
  Filled 2021-04-08: qty 10

## 2021-04-08 MED ORDER — SODIUM CHLORIDE 0.9 % IV SOLN
Freq: Once | INTRAVENOUS | Status: AC
Start: 2021-04-08 — End: 2021-04-08
  Filled 2021-04-08: qty 250

## 2021-04-08 MED ORDER — HEPARIN SOD (PORK) LOCK FLUSH 100 UNIT/ML IV SOLN
INTRAVENOUS | Status: AC
  Administered 2021-04-08: 500 [IU] via INTRAVENOUS
  Filled 2021-04-08: qty 5

## 2021-04-08 NOTE — Patient Instructions (Signed)
[facility]   Discharge Instructions: Thank you for choosing [facility] to provide your oncology and hematology care.  If you have a lab appointment with the [facility], please go directly to the [facility] and check in at the registration area.  Wear comfortable clothing and clothing appropriate for easy access to any Portacath or PICC line.   We strive to give you quality time with your provider. You may need to reschedule your appointment if you arrive late (15 or more minutes).  Arriving late affects you and other patients whose appointments are after yours.  Also, if you miss three or more appointments without notifying the office, you may be dismissed from the clinic at the provider's discretion.      For prescription refill requests, have your pharmacy contact our office and allow 72 hours for refills to be completed.    Today you received the following chemotherapy and/or immunotherapy agents: Keytruda.      To help prevent nausea and vomiting after your treatment, we encourage you to take your nausea medication as directed.  BELOW ARE SYMPTOMS THAT SHOULD BE REPORTED IMMEDIATELY: *FEVER GREATER THAN 100.4 F (38 C) OR HIGHER *CHILLS OR SWEATING *NAUSEA AND VOMITING THAT IS NOT CONTROLLED WITH YOUR NAUSEA MEDICATION *UNUSUAL SHORTNESS OF BREATH *UNUSUAL BRUISING OR BLEEDING *URINARY PROBLEMS (pain or burning when urinating, or frequent urination) *BOWEL PROBLEMS (unusual diarrhea, constipation, pain near the anus) TENDERNESS IN MOUTH AND THROAT WITH OR WITHOUT PRESENCE OF ULCERS (sore throat, sores in mouth, or a toothache) UNUSUAL RASH, SWELLING OR PAIN  UNUSUAL VAGINAL DISCHARGE OR ITCHING   Items with * indicate a potential emergency and should be followed up as soon as possible or go to the Emergency Department if any problems should occur.  Please show the CHEMOTHERAPY ALERT CARD or IMMUNOTHERAPY ALERT CARD at  check-in to the Emergency Department and triage nurse.  Should you have questions after your visit or need to cancel or reschedule your appointment, please contact [facility]  [phone] and follow the prompts.  Office hours are 8:00 a.m. to 4:30 p.m. Monday - Friday. Please note that voicemails left after 4:00 p.m. may not be returned until the following business day.  We are closed weekends and major holidays. You have access to a nurse at all times for urgent questions. Please call the main number to the clinic [phone] and follow the prompts.  For any non-urgent questions, you may also contact your provider using MyChart. We now offer e-Visits for anyone 56 and older to request care online for non-urgent symptoms. For details visit [email or web address].   Also download the MyChart app! Go to the app store, search "MyChart", open the app, select [facility], and log in with your MyChart username and password.  Due to Covid, a mask is required upon entering the hospital/clinic. If you do not have a mask, one will be given to you upon arrival. For doctor visits, patients may have 1 support person aged 47 or older with them. For treatment visits, patients cannot have anyone with them due to current Covid guidelines and our immunocompromised population.

## 2021-04-08 NOTE — Patient Instructions (Signed)
Start Synthroid low-dose 25 mcg once a day in the morning; take it before breakfast/hour before any other medications or meals.

## 2021-04-08 NOTE — Assessment & Plan Note (Addendum)
#   Stage IV metastatic non-small cell lung cancer favor adeno; mets to bone; liver.  Currently on Keytruda maintenance; MAY 26th, 2022- STABLE;  right upper lobe lesion; slight decrease in the hepatic metastatic disease; stable T3-T4 compression fractures/T1 T6 spinal fusion.  STABLE.    # Proceed with [place]. Labs today reviewed;  acceptable for treatment today.   # Bone metastases- on zometa 3 mg IVPB.  Every 6 weeks. Ca-8.8.Clinically- STABLE.   # left shoulder pain- [facility]- recommend NSAIDs/Topical. .Clinically-STABLE.   #Debility-spinal cord compression/status post surgery-s/p physical therapy-. Clinically- STABLE.    # Hypothyroidism- TSH slightlly elevated-9; T4/T3-N [AUG 2022]; but extremely fatigued.  Start Synthroid low-dose 25 mcg once a day in the morning; take it before breakfast/hour before any other medications or meals.  # COPD-.Clinically- STABLE. On 2 lit O2; Trelegy.  # DISPOSITION: # Keytruda;  # follow up in 3 weeks MD;labs- cbc/cmp; Keytruda; [email or web address].B

## 2021-04-08 NOTE — Progress Notes (Signed)
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   # MAY 26th-Keytrda x2 cycles; [borderline PS] July 7th-carbo Alimta Keytruda cycle #1  # NGS/MOLECULAR TESTS:   # PALLIATIVE CARE EVALUATION:  # PAIN MANAGEMENT:    DIAGNOSIS:   STAGE:         ;  GOALS:  CURRENT/MOST RECENT THERAPY :     Cancer of upper lobe of right lung ([place])  11/23/2019 Initial Diagnosis   Cancer of upper lobe of right lung ([place])   12/21/2019 -  Chemotherapy    Patient is on Treatment Plan: LUNG CARBOPLATIN / PEMETREXED / PEMBROLIZUMAB Q21D INDUCTION X 4 CYCLES / MAINTENANCE PEMETREXED + PEMBROLIZUMAB        HISTORY OF PRESENTING ILLNESS:  Cody [name] 65 y.o.  male patient with metastatic non-small cell lung cancer; cord compression status post decompressive surgery; currently on Keytruda maintenance-is here for follow-up.  Patient complains of worsening fatigue the last many weeks.  He has been needing to take afternoon naps.  Denies any nausea vomiting.  He continues with oxygen.  Review of Systems  Constitutional:  Positive for malaise/fatigue. Negative for chills, diaphoresis and fever.  HENT:  Negative for nosebleeds and sore throat.   Eyes:  Negative for double vision.  Respiratory:  Positive  for shortness of breath. Negative for cough, hemoptysis, sputum production and wheezing.   Cardiovascular:  Negative for chest pain, palpitations, orthopnea and leg swelling.  Gastrointestinal:  Negative for abdominal pain, blood in stool, constipation, heartburn, melena, nausea and vomiting.  Genitourinary:  Negative for dysuria, frequency and urgency.  Musculoskeletal:  Positive for back pain and joint pain.  Skin: Negative.  Negative for itching and rash.  Neurological:  Negative for dizziness, tingling, focal weakness, weakness and headaches.  Endo/Heme/Allergies:  Does not bruise/bleed easily.  Psychiatric/Behavioral:  Negative for depression. The patient is not nervous/anxious and does not have insomnia.     MEDICAL HISTORY:  Past Medical History:  Diagnosis Date   Allergy    Cancer of upper lobe of right lung ([place]) 10/2019   COPD (chronic obstructive pulmonary disease) (HCC)    Prostate enlargement     SURGICAL HISTORY: Past Surgical History:  Procedure Laterality Date   HERNIA REPAIR Bilateral 1982   HERNIA REPAIR Right 1994   IR IMAGING GUIDED PORT INSERTION  02/24/2020   LAMINECTOMY FOR EXCISION / EVACUATION INTRASPINAL LESION  11/07/2019   T1-T 6 at [facility]     SOCIAL HISTORY: Social History   Socioeconomic History   Marital status: Married    Spouse name: [name]    Number of children: 4   Years of education: Not on file   Highest education level: Some college, no degree  Occupational History   Occupation: dispatch and delivery   Tobacco Use   Smoking status: Former    Packs/day: 2.00    Years: 30.00  Pack years: 60.00    Types: Cigarettes    Quit date: 07/29/2011    Years since quitting: 9.7   Smokeless tobacco: Never   Tobacco comments:    pt vapes  Vaping Use   Vaping Use: Not on file  Substance and Sexual Activity   Alcohol use: Yes    Alcohol/week: 5.0 standard drinks    Types: 5 Standard drinks or equivalent per week    Comment: 1-2/day   Drug  use: No   Sexual activity: Not Currently    Partners: Female  Other Topics Concern   Not on file  Social History Narrative   Worked in warehouse/ quit smoking in 2015; beer 1/day; in [place]; with wife.    Social Determinants of Health   Financial Resource Strain: Not on file  Food Insecurity: Not on file  Transportation Needs: Not on file  Physical Activity: Not on file  Stress: Not on file  Social Connections: Not on file  Intimate Partner Violence: Not on file    FAMILY HISTORY: Family History  Problem Relation Age of Onset   Heart disease Father    CVA Mother    Lung cancer Sister    Lung cancer Brother     ALLERGIES:  has No Known Allergies.  MEDICATIONS:  Current Outpatient Medications  Medication Sig Dispense Refill   acetaminophen (TYLENOL) 500 MG tablet Take 500-1,000 mg by mouth every 6 (six) hours as needed for mild pain or fever.      albuterol (VENTOLIN HFA) 108 (90 Base) MCG/ACT inhaler Inhale 2 puffs into the lungs every 4 (four) hours as needed for wheezing or shortness of breath. 18 g 0   Calcium Carb-Cholecalciferol (OYSTER SHELL CALCIUM) 500-400 MG-UNIT TABS Take 500 tablets by mouth.     escitalopram (LEXAPRO) 5 MG tablet Take 1 tablet (5 mg total) by mouth daily. am's 90 tablet 1   Fluticasone-Umeclidin-Vilant (TRELEGY ELLIPTA) 100-62.5-25 MCG/INH AEPB Inhale 1 puff into the lungs daily. 180 each 1   levothyroxine (SYNTHROID) 25 MCG tablet Take 1 tablet (25 mcg total) by mouth daily before breakfast. 30 tablet 3   lidocaine-prilocaine (EMLA) cream Apply 1 application topically as needed. Apply small amount to port site at least 1 hour prior to it being accessed, cover with plastic wrap 30 g 1   Multiple Vitamins-Minerals (MULTIVITAMIN GUMMIES MENS PO) Take 1 Dose by mouth daily.      ondansetron (ZOFRAN) 4 MG tablet Take 1 tablet (4 mg total) by mouth every 8 (eight) hours as needed for nausea or vomiting. 20 tablet 2   prochlorperazine (COMPAZINE) 10  MG tablet Take 1 tablet (10 mg total) by mouth every 6 (six) hours as needed for nausea or vomiting. 40 tablet 1   QUEtiapine (SEROQUEL) 25 MG tablet Take 1 tablet (25 mg total) by mouth at bedtime. 90 tablet 1   No current facility-administered medications for this visit.      Marland Kitchen  PHYSICAL EXAMINATION: ECOG PERFORMANCE STATUS: 3 - Symptomatic, >50% confined to bed  Vitals:   04/08/21 0920  BP: 122/79  Pulse: 83  Resp: 20  Temp: (!) 97.3 F (36.3 C)  SpO2: 100%   Filed Weights   04/08/21 0920  Weight: 122 lb 4.8 oz (55.5 kg)    Physical Exam Constitutional:      Comments: Thin built frail appearing male patient.  No acute distress.  Accompanied by his family.   HENT:     Head: Normocephalic and atraumatic.  Mouth/Throat:     Pharynx: No oropharyngeal exudate.  Eyes:     Pupils: Pupils are equal, round, and reactive to light.  Cardiovascular:     Rate and Rhythm: Normal rate and regular rhythm.  Pulmonary:     Effort: No respiratory distress.     Breath sounds: No wheezing.     Comments: Decreased air entry bilaterally. Abdominal:     General: Bowel sounds are normal. There is no distension.     Palpations: Abdomen is soft. There is no mass.     Tenderness: There is no abdominal tenderness. There is no guarding or rebound.  Musculoskeletal:        General: No tenderness. Normal range of motion.     Cervical back: Normal range of motion and neck supple.  Skin:    General: Skin is warm.  Neurological:     Mental Status: He is alert and oriented to person, place, and time.  Psychiatric:        Mood and Affect: Affect normal.     LABORATORY DATA:  I have reviewed the data as listed Lab Results  Component Value Date   WBC 6.9 04/08/2021   HGB 14.2 04/08/2021   HCT 44.3 04/08/2021   MCV 92.5 04/08/2021   PLT 251 04/08/2021   Recent Labs    04/25/20 0834 05/16/20 0824 02/25/21 0835 03/18/21 0856 04/08/21 0856  NA 139   < > 135 135 136  K 4.2   < >  3.9 4.1 4.1  CL 102   < > 95* 96* 97*  CO2 28   < > 30 32 34*  GLUCOSE 102*   < > 101* 102* 111*  BUN 15   < > 12 14 10   CREATININE 0.60*   < > 0.48* 0.53* 0.41*  CALCIUM 8.8*   < > 8.6* 8.5* 9.0  GFRNONAA >60   < > >60 >60 >60  GFRAA >60  --   --   --   --   PROT 6.4*   < > 7.4 7.6 7.4  ALBUMIN 3.7   < > 3.6 3.6 3.6  AST 23   < > 21 21 19   ALT 19   < > 12 11 11   ALKPHOS 66   < > 74 70 73  BILITOT 0.6   < > 0.3 0.8 0.6   < > = values in this interval not displayed.    RADIOGRAPHIC STUDIES: I have personally reviewed the radiological images as listed and agreed with the findings in the report. No results found.   ASSESSMENT & PLAN:   Cancer of upper lobe of right lung ([place]) # Stage IV metastatic non-small cell lung cancer favor adeno; mets to bone; liver.  Currently on Keytruda maintenance; MAY 26th, 2022- STABLE;  right upper lobe lesion; slight decrease in the hepatic metastatic disease; stable T3-T4 compression fractures/T1 T6 spinal fusion.  STABLE.    # Proceed with [place]. Labs today reviewed;  acceptable for treatment today.   # Bone metastases- on zometa 3 mg IVPB.  Every 6 weeks. Ca-8.8.Clinically- STABLE.   # left shoulder pain- [facility]- recommend NSAIDs/Topical. .Clinically-STABLE.   #Debility-spinal cord compression/status post surgery-s/p physical therapy-. Clinically- STABLE.    # Hypothyroidism- TSH slightlly elevated-9; T4/T3-N [AUG 2022]; but extremely fatigued.  Start Synthroid low-dose 25 mcg once a day in the morning; take it before breakfast/hour before any other medications or meals.  # COPD-.Clinically- STABLE. On 2 lit O2; Trelegy.  # DISPOSITION: #  Keytruda;  # follow up in 3 weeks MD;labs- cbc/cmp; Keytruda; ZOMETADr.B  All questions were answered. The patient knows to call the clinic with any problems, questions or concerns.    Cammie Sickle, MD 04/09/2021 12:06 AM

## 2021-04-09 ENCOUNTER — Encounter: Payer: Self-pay | Admitting: Internal Medicine

## 2021-04-29 ENCOUNTER — Inpatient Hospital Stay: Payer: Medicare HMO

## 2021-04-29 ENCOUNTER — Encounter: Payer: Self-pay | Admitting: Internal Medicine

## 2021-04-29 ENCOUNTER — Inpatient Hospital Stay: Payer: Medicare HMO | Attending: Internal Medicine

## 2021-04-29 ENCOUNTER — Inpatient Hospital Stay: Payer: Medicare HMO | Admitting: Internal Medicine

## 2021-04-29 VITALS — BP 113/73 | HR 78

## 2021-04-29 VITALS — BP 126/77 | HR 75 | Temp 97.0°F | Resp 20 | Wt 121.4 lb

## 2021-04-29 DIAGNOSIS — R946 Abnormal results of thyroid function studies: Secondary | ICD-10-CM | POA: Diagnosis not present

## 2021-04-29 DIAGNOSIS — C3410 Malignant neoplasm of upper lobe, unspecified bronchus or lung: Secondary | ICD-10-CM

## 2021-04-29 DIAGNOSIS — Z801 Family history of malignant neoplasm of trachea, bronchus and lung: Secondary | ICD-10-CM | POA: Diagnosis not present

## 2021-04-29 DIAGNOSIS — Z7189 Other specified counseling: Secondary | ICD-10-CM

## 2021-04-29 DIAGNOSIS — M549 Dorsalgia, unspecified: Secondary | ICD-10-CM | POA: Insufficient documentation

## 2021-04-29 DIAGNOSIS — M2550 Pain in unspecified joint: Secondary | ICD-10-CM | POA: Insufficient documentation

## 2021-04-29 DIAGNOSIS — K838 Other specified diseases of biliary tract: Secondary | ICD-10-CM | POA: Insufficient documentation

## 2021-04-29 DIAGNOSIS — Z95828 Presence of other vascular implants and grafts: Secondary | ICD-10-CM

## 2021-04-29 DIAGNOSIS — E039 Hypothyroidism, unspecified: Secondary | ICD-10-CM | POA: Diagnosis not present

## 2021-04-29 DIAGNOSIS — Z79890 Hormone replacement therapy (postmenopausal): Secondary | ICD-10-CM | POA: Insufficient documentation

## 2021-04-29 DIAGNOSIS — Z23 Encounter for immunization: Secondary | ICD-10-CM | POA: Diagnosis not present

## 2021-04-29 DIAGNOSIS — G9520 Unspecified cord compression: Secondary | ICD-10-CM | POA: Diagnosis not present

## 2021-04-29 DIAGNOSIS — Z79899 Other long term (current) drug therapy: Secondary | ICD-10-CM | POA: Insufficient documentation

## 2021-04-29 DIAGNOSIS — R0602 Shortness of breath: Secondary | ICD-10-CM | POA: Diagnosis not present

## 2021-04-29 DIAGNOSIS — M25512 Pain in left shoulder: Secondary | ICD-10-CM | POA: Diagnosis not present

## 2021-04-29 DIAGNOSIS — C7951 Secondary malignant neoplasm of bone: Secondary | ICD-10-CM | POA: Diagnosis not present

## 2021-04-29 DIAGNOSIS — N400 Enlarged prostate without lower urinary tract symptoms: Secondary | ICD-10-CM | POA: Insufficient documentation

## 2021-04-29 DIAGNOSIS — Z87891 Personal history of nicotine dependence: Secondary | ICD-10-CM | POA: Insufficient documentation

## 2021-04-29 DIAGNOSIS — J449 Chronic obstructive pulmonary disease, unspecified: Secondary | ICD-10-CM | POA: Insufficient documentation

## 2021-04-29 DIAGNOSIS — Z5112 Encounter for antineoplastic immunotherapy: Secondary | ICD-10-CM | POA: Diagnosis present

## 2021-04-29 DIAGNOSIS — C3411 Malignant neoplasm of upper lobe, right bronchus or lung: Secondary | ICD-10-CM | POA: Diagnosis not present

## 2021-04-29 DIAGNOSIS — C787 Secondary malignant neoplasm of liver and intrahepatic bile duct: Secondary | ICD-10-CM | POA: Insufficient documentation

## 2021-04-29 DIAGNOSIS — M4854XA Collapsed vertebra, not elsewhere classified, thoracic region, initial encounter for fracture: Secondary | ICD-10-CM | POA: Insufficient documentation

## 2021-04-29 DIAGNOSIS — J432 Centrilobular emphysema: Secondary | ICD-10-CM | POA: Insufficient documentation

## 2021-04-29 DIAGNOSIS — R5383 Other fatigue: Secondary | ICD-10-CM | POA: Diagnosis not present

## 2021-04-29 DIAGNOSIS — Z8249 Family history of ischemic heart disease and other diseases of the circulatory system: Secondary | ICD-10-CM | POA: Insufficient documentation

## 2021-04-29 DIAGNOSIS — Z981 Arthrodesis status: Secondary | ICD-10-CM | POA: Insufficient documentation

## 2021-04-29 DIAGNOSIS — I2510 Atherosclerotic heart disease of native coronary artery without angina pectoris: Secondary | ICD-10-CM | POA: Insufficient documentation

## 2021-04-29 LAB — CBC WITH DIFFERENTIAL/PLATELET
Abs Immature Granulocytes: 0.02 10*3/uL (ref 0.00–0.07)
Basophils Absolute: 0.1 10*3/uL (ref 0.0–0.1)
Basophils Relative: 1 %
Eosinophils Absolute: 0.4 10*3/uL (ref 0.0–0.5)
Eosinophils Relative: 5 %
HCT: 43.9 % (ref 39.0–52.0)
Hemoglobin: 13.9 g/dL (ref 13.0–17.0)
Immature Granulocytes: 0 %
Lymphocytes Relative: 11 %
Lymphs Abs: 0.9 10*3/uL (ref 0.7–4.0)
MCH: 29.5 pg (ref 26.0–34.0)
MCHC: 31.7 g/dL (ref 30.0–36.0)
MCV: 93.2 fL (ref 80.0–100.0)
Monocytes Absolute: 0.8 10*3/uL (ref 0.1–1.0)
Monocytes Relative: 10 %
Neutro Abs: 6 10*3/uL (ref 1.7–7.7)
Neutrophils Relative %: 73 %
Platelets: 250 10*3/uL (ref 150–400)
RBC: 4.71 MIL/uL (ref 4.22–5.81)
RDW: 12.7 % (ref 11.5–15.5)
WBC: 8.1 10*3/uL (ref 4.0–10.5)
nRBC: 0 % (ref 0.0–0.2)

## 2021-04-29 LAB — COMPREHENSIVE METABOLIC PANEL
ALT: 11 U/L (ref 0–44)
AST: 26 U/L (ref 15–41)
Albumin: 3.6 g/dL (ref 3.5–5.0)
Alkaline Phosphatase: 71 U/L (ref 38–126)
Anion gap: 8 (ref 5–15)
BUN: 14 mg/dL (ref 8–23)
CO2: 33 mmol/L — ABNORMAL HIGH (ref 22–32)
Calcium: 8.5 mg/dL — ABNORMAL LOW (ref 8.9–10.3)
Chloride: 93 mmol/L — ABNORMAL LOW (ref 98–111)
Creatinine, Ser: 0.45 mg/dL — ABNORMAL LOW (ref 0.61–1.24)
GFR, Estimated: >60 mL/min (ref >60)
Glucose, Bld: 106 mg/dL — ABNORMAL HIGH (ref 70–99)
Potassium: 4.3 mmol/L (ref 3.5–5.1)
Sodium: 134 mmol/L — ABNORMAL LOW (ref 135–145)
Total Bilirubin: 0.7 mg/dL (ref 0.3–1.2)
Total Protein: 7.8 g/dL (ref 6.5–8.1)

## 2021-04-29 MED ORDER — HEPARIN SOD (PORK) LOCK FLUSH 100 UNIT/ML IV SOLN
500.0000 [IU] | Freq: Once | INTRAVENOUS | Status: AC | PRN
  Administered 2021-04-29: 500 [IU]
  Filled 2021-04-29: qty 5

## 2021-04-29 MED ORDER — SODIUM CHLORIDE 0.9 % IV SOLN
Freq: Once | INTRAVENOUS | Status: AC
  Filled 2021-04-29: qty 250

## 2021-04-29 MED ORDER — SODIUM CHLORIDE 0.9% FLUSH
10.0000 mL | Freq: Once | INTRAVENOUS | Status: DC
  Filled 2021-04-29: qty 10

## 2021-04-29 MED ORDER — SODIUM CHLORIDE 0.9 % IV SOLN
200.0000 mg | Freq: Once | INTRAVENOUS | Status: AC
  Administered 2021-04-29: 200 mg via INTRAVENOUS
  Filled 2021-04-29: qty 8

## 2021-04-29 NOTE — Progress Notes (Signed)
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   # MAY 26th-Keytrda x2 cycles; [borderline PS] July 7th-carbo Alimta Keytruda cycle #1  # NGS/MOLECULAR TESTS:   # PALLIATIVE CARE EVALUATION:  # PAIN MANAGEMENT:    DIAGNOSIS:   STAGE:         ;  GOALS:  CURRENT/MOST RECENT THERAPY :     Cancer of upper lobe of right lung ([place])  11/23/2019 Initial Diagnosis   Cancer of upper lobe of right lung ([place])   12/21/2019 -  Chemotherapy   Patient is on Treatment Plan : LUNG CARBOplatin / Pemetrexed / Pembrolizumab q21d Induction x 4 cycles / Maintenance Pemetrexed + Pembrolizumab      HISTORY OF PRESENTING ILLNESS: Thin built frail appearing male patient.  No acute distress.  ALONE.  Nasal cannula oxygen. Cody [name] 65 y.o.  male patient with metastatic non-small cell lung cancer; cord compression status post decompressive surgery; currently on Keytruda maintenance-is here for follow-up.  Patient was started on Synthroid at last visit because of elevated TSH.  Patient notes to have mild improvement of fatigue.  No side effects.  No nausea no vomiting no diarrhea.   Review of Systems  Constitutional:  Positive for malaise/fatigue. Negative for chills, diaphoresis and fever.  HENT:   Negative for nosebleeds and sore throat.   Eyes:  Negative for double vision.  Respiratory:  Positive for shortness of breath. Negative for cough, hemoptysis, sputum production and wheezing.   Cardiovascular:  Negative for chest pain, palpitations, orthopnea and leg swelling.  Gastrointestinal:  Negative for abdominal pain, blood in stool, constipation, heartburn, melena, nausea and vomiting.  Genitourinary:  Negative for dysuria, frequency and urgency.  Musculoskeletal:  Positive for back pain and joint pain.  Skin: Negative.  Negative for itching and rash.  Neurological:  Negative for dizziness, tingling, focal weakness, weakness and headaches.  Endo/Heme/Allergies:  Does not bruise/bleed easily.  Psychiatric/Behavioral:  Negative for depression. The patient is not nervous/anxious and does not have insomnia.     MEDICAL HISTORY:  Past Medical History:  Diagnosis Date   Allergy    Cancer of upper lobe of right lung ([place]) 10/2019   COPD (chronic obstructive pulmonary disease) (HCC)    Prostate enlargement     SURGICAL HISTORY: Past Surgical History:  Procedure Laterality Date   HERNIA REPAIR Bilateral 1982   HERNIA REPAIR Right 1994   IR IMAGING GUIDED PORT INSERTION  02/24/2020   LAMINECTOMY FOR EXCISION / EVACUATION INTRASPINAL LESION  11/07/2019   T1-T 6 at [facility]     SOCIAL HISTORY: Social History   Socioeconomic History   Marital status: Married    Spouse name: [name]    Number of children: 4   Years of education: Not on file   Highest education level: Some college, no degree  Occupational History   Occupation: dispatch and delivery   Tobacco  Use   Smoking status: Former    Packs/day: 2.00    Years: 30.00    Pack years: 60.00    Types: Cigarettes    Quit date: 07/29/2011    Years since quitting: 9.7   Smokeless tobacco: Never   Tobacco comments:    pt vapes  Vaping Use   Vaping Use: Not on file  Substance and Sexual Activity   Alcohol use: Yes    Alcohol/week:  5.0 standard drinks    Types: 5 Standard drinks or equivalent per week    Comment: 1-2/day   Drug use: No   Sexual activity: Not Currently    Partners: Female  Other Topics Concern   Not on file  Social History Narrative   Worked in warehouse/ quit smoking in 2015; beer 1/day; in [place]; with wife.    Social Determinants of Health   Financial Resource Strain: Not on file  Food Insecurity: Not on file  Transportation Needs: Not on file  Physical Activity: Not on file  Stress: Not on file  Social Connections: Not on file  Intimate Partner Violence: Not on file    FAMILY HISTORY: Family History  Problem Relation Age of Onset   Heart disease Father    CVA Mother    Lung cancer Sister    Lung cancer Brother     ALLERGIES:  has No Known Allergies.  MEDICATIONS:  Current Outpatient Medications  Medication Sig Dispense Refill   acetaminophen (TYLENOL) 500 MG tablet Take 500-1,000 mg by mouth every 6 (six) hours as needed for mild pain or fever.      albuterol (VENTOLIN HFA) 108 (90 Base) MCG/ACT inhaler Inhale 2 puffs into the lungs every 4 (four) hours as needed for wheezing or shortness of breath. 18 g 0   Calcium Carb-Cholecalciferol (OYSTER SHELL CALCIUM) 500-400 MG-UNIT TABS Take 500 tablets by mouth.     escitalopram (LEXAPRO) 5 MG tablet Take 1 tablet (5 mg total) by mouth daily. am's 90 tablet 1   Fluticasone-Umeclidin-Vilant (TRELEGY ELLIPTA) 100-62.5-25 MCG/INH AEPB Inhale 1 puff into the lungs daily. 180 each 1   levothyroxine (SYNTHROID) 25 MCG tablet Take 1 tablet (25 mcg total) by mouth daily before breakfast. 30 tablet 3   lidocaine-prilocaine (EMLA) cream Apply 1 application topically as needed. Apply small amount to port site at least 1 hour prior to it being accessed, cover with plastic wrap 30 g 1   Multiple Vitamins-Minerals (MULTIVITAMIN GUMMIES MENS PO) Take 1 Dose by mouth daily.      ondansetron (ZOFRAN) 4 MG tablet Take 1 tablet (4 mg total) by mouth  every 8 (eight) hours as needed for nausea or vomiting. 20 tablet 2   prochlorperazine (COMPAZINE) 10 MG tablet Take 1 tablet (10 mg total) by mouth every 6 (six) hours as needed for nausea or vomiting. 40 tablet 1   QUEtiapine (SEROQUEL) 25 MG tablet Take 1 tablet (25 mg total) by mouth at bedtime. 90 tablet 1   No current facility-administered medications for this visit.      Marland Kitchen  PHYSICAL EXAMINATION: ECOG PERFORMANCE STATUS: 3 - Symptomatic, >50% confined to bed  Vitals:   04/29/21 0945  BP: 126/77  Pulse: 75  Resp: 20  Temp: (!) 97 F (36.1 C)  SpO2: 100%   Filed Weights   04/29/21 0945  Weight: 121 lb 6.4 oz (55.1 kg)    Physical Exam HENT:     Head: Normocephalic and atraumatic.     Mouth/Throat:  Pharynx: No oropharyngeal exudate.  Eyes:     Pupils: Pupils are equal, round, and reactive to light.  Cardiovascular:     Rate and Rhythm: Normal rate and regular rhythm.  Pulmonary:     Effort: No respiratory distress.     Breath sounds: No wheezing.     Comments: Decreased air entry bilaterally. Abdominal:     General: Bowel sounds are normal. There is no distension.     Palpations: Abdomen is soft. There is no mass.     Tenderness: There is no abdominal tenderness. There is no guarding or rebound.  Musculoskeletal:        General: No tenderness. Normal range of motion.     Cervical back: Normal range of motion and neck supple.  Skin:    General: Skin is warm.  Neurological:     Mental Status: He is alert and oriented to person, place, and time.  Psychiatric:        Mood and Affect: Affect normal.     LABORATORY DATA:  I have reviewed the data as listed Lab Results  Component Value Date   WBC 8.1 04/29/2021   HGB 13.9 04/29/2021   HCT 43.9 04/29/2021   MCV 93.2 04/29/2021   PLT 250 04/29/2021   Recent Labs    03/18/21 0856 04/08/21 0856 04/29/21 0904  NA 135 136 134*  K 4.1 4.1 4.3  CL 96* 97* 93*  CO2 32 34* 33*  GLUCOSE 102* 111* 106*   BUN _0 CREATININE 0.53* 0.41* 0.45*  CALCIUM 8.5* 9.0 8.5*  GFRNONAA >60 >60 >60  PROT 7.6 7.4 7.8  ALBUMIN 3.6 3.6 3.6  AST _1 ALT _2 ALKPHOS 70 73 71  BILITOT 0.8 0.6 0.7    RADIOGRAPHIC STUDIES: I have personally reviewed the radiological images as listed and agreed with the findings in the report. No results found.   ASSESSMENT & PLAN:   Cancer of upper lobe of right lung ([place]) # Stage IV metastatic non-small cell lung cancer favor adeno; mets to bone; liver.  Currently on Keytruda maintenance; MAY 26th, 2022- STABLE;  right upper lobe lesion; slight decrease in the hepatic metastatic disease; stable T3-T4 compression fractures/T1 T6 spinal fusion. STABLE   # Proceed with [place]. Labs today reviewed;  acceptable for treatment today. will get CT CAP prior to next visit.  # Bone metastases- on zometa 3 mg IVPB.  Every 6 weeks. Ca-8.5.Clinically- STABLE> HOLD zomeat today  # left shoulder pain- [facility]- recommend NSAIDs/Topical. .Clinically-STABLE.  #Debility-spinal cord compression/status post surgery-s/p physical therapy-. Clinically- STABLE.   # Hypothyroidism- TSH slightlly elevated-9; T4/T3-N [AUG 2022]; but extremely fatigued.  Mid sep started on Synthroid low-dose 25 mcg once a day in the morning; will re-check at next visit  # COPD-.Clinically- STABLE. On 2 lit O2; Trelegy.  # DISPOSITION: # Keytruda; HOLD Zometa # follow up in 3 weeks MD;labs- cbc/cmp;Thyroid profile; Keytruda;-  ZOMETA; CT CAP prior-Dr.B  All questions were answered. The patient knows to call the clinic with any problems, questions or concerns.    Cammie Sickle, MD 04/29/2021 4:26 PM

## 2021-04-29 NOTE — Patient Instructions (Signed)
[facility]  Discharge Instructions: Thank you for choosing [facility] to provide your oncology and hematology care.  If you have a lab appointment with the Cancer Center, please go directly to the Cancer Center and check in at the registration area.  Wear comfortable clothing and clothing appropriate for easy access to any Portacath or PICC line.   We strive to give you quality time with your provider. You may need to reschedule your appointment if you arrive late (15 or more minutes).  Arriving late affects you and other patients whose appointments are after yours.  Also, if you miss three or more appointments without notifying the office, you may be dismissed from the clinic at the provider's discretion.      For prescription refill requests, have your pharmacy contact our office and allow 72 hours for refills to be completed.      To help prevent nausea and vomiting after your treatment, we encourage you to take your nausea medication as directed.  BELOW ARE SYMPTOMS THAT SHOULD BE REPORTED IMMEDIATELY: *FEVER GREATER THAN 100.4 F (38 C) OR HIGHER *CHILLS OR SWEATING *NAUSEA AND VOMITING THAT IS NOT CONTROLLED WITH YOUR NAUSEA MEDICATION *UNUSUAL SHORTNESS OF BREATH *UNUSUAL BRUISING OR BLEEDING *URINARY PROBLEMS (pain or burning when urinating, or frequent urination) *BOWEL PROBLEMS (unusual diarrhea, constipation, pain near the anus) TENDERNESS IN MOUTH AND THROAT WITH OR WITHOUT PRESENCE OF ULCERS (sore throat, sores in mouth, or a toothache) UNUSUAL RASH, SWELLING OR PAIN  UNUSUAL VAGINAL DISCHARGE OR ITCHING   Items with * indicate a potential emergency and should be followed up as soon as possible or go to the Emergency Department if any problems should occur.  Please show the CHEMOTHERAPY ALERT CARD or IMMUNOTHERAPY ALERT CARD at check-in to the Emergency Department and triage nurse.  Should you have questions after your  visit or need to cancel or reschedule your appointment, please contact [facility]  [phone] and follow the prompts.  Office hours are 8:00 a.m. to 4:30 p.m. Monday - Friday. Please note that voicemails left after 4:00 p.m. may not be returned until the following business day.  We are closed weekends and major holidays. You have access to a nurse at all times for urgent questions. Please call the main number to the clinic [phone] and follow the prompts.  For any non-urgent questions, you may also contact your provider using MyChart. We now offer e-Visits for anyone 18 and older to request care online for non-urgent symptoms. For details visit [email or web address].   Also download the MyChart app! Go to the app store, search "MyChart", open the app, select [facility], and log in with your MyChart username and password.  Due to Covid, a mask is required upon entering the hospital/clinic. If you do not have a mask, one will be given to you upon arrival. For doctor visits, patients may have 1 support person aged 18 or older with them. For treatment visits, patients cannot have anyone with them due to current Covid guidelines and our immunocompromised population.  

## 2021-04-29 NOTE — Assessment & Plan Note (Addendum)
#   Stage IV metastatic non-small cell lung cancer favor adeno; mets to bone; liver.  Currently on Keytruda maintenance; MAY 26th, 2022- STABLE;  right upper lobe lesion; slight decrease in the hepatic metastatic disease; stable T3-T4 compression fractures/T1 T6 spinal fusion. STABLE   # Proceed with [place]. Labs today reviewed;  acceptable for treatment today. will get CT CAP prior to next visit.  # Bone metastases- on zometa 3 mg IVPB.  Every 6 weeks. Ca-8.5.Clinically- STABLE> HOLD zomeat today  # left shoulder pain- [facility]- recommend NSAIDs/Topical. .Clinically-STABLE.  #Debility-spinal cord compression/status post surgery-s/p physical therapy-. Clinically- STABLE.   # Hypothyroidism- TSH slightlly elevated-9; T4/T3-N [AUG 2022]; but extremely fatigued.  Mid sep started on Synthroid low-dose 25 mcg once a day in the morning; will re-check at next visit  # COPD-.Clinically- STABLE. On 2 lit O2; Trelegy.  # DISPOSITION: # Keytruda; HOLD Zometa # follow up in 3 weeks MD;labs- cbc/cmp;Thyroid profile; Keytruda;-  ZOMETA; CT CAP prior-Dr.B

## 2021-05-01 DIAGNOSIS — J441 Chronic obstructive pulmonary disease with (acute) exacerbation: Secondary | ICD-10-CM | POA: Diagnosis not present

## 2021-05-01 DIAGNOSIS — J9601 Acute respiratory failure with hypoxia: Secondary | ICD-10-CM | POA: Diagnosis not present

## 2021-05-17 ENCOUNTER — Ambulatory Visit
Admission: RE | Admit: 2021-05-17 | Discharge: 2021-05-17 | Disposition: A | Discharge status: Home / Self Care | Payer: Medicare HMO | Source: Ambulatory Visit | Attending: Internal Medicine | Admitting: Internal Medicine

## 2021-05-17 ENCOUNTER — Other Ambulatory Visit: Payer: Self-pay

## 2021-05-17 DIAGNOSIS — M8448XA Pathological fracture, other site, initial encounter for fracture: Secondary | ICD-10-CM | POA: Diagnosis not present

## 2021-05-17 DIAGNOSIS — C3411 Malignant neoplasm of upper lobe, right bronchus or lung: Secondary | ICD-10-CM | POA: Diagnosis not present

## 2021-05-17 DIAGNOSIS — C3410 Malignant neoplasm of upper lobe, unspecified bronchus or lung: Secondary | ICD-10-CM

## 2021-05-17 DIAGNOSIS — C787 Secondary malignant neoplasm of liver and intrahepatic bile duct: Secondary | ICD-10-CM | POA: Diagnosis not present

## 2021-05-17 DIAGNOSIS — N3289 Other specified disorders of bladder: Secondary | ICD-10-CM | POA: Diagnosis not present

## 2021-05-17 DIAGNOSIS — J432 Centrilobular emphysema: Secondary | ICD-10-CM | POA: Diagnosis not present

## 2021-05-17 DIAGNOSIS — I2510 Atherosclerotic heart disease of native coronary artery without angina pectoris: Secondary | ICD-10-CM | POA: Diagnosis not present

## 2021-05-17 DIAGNOSIS — K769 Liver disease, unspecified: Secondary | ICD-10-CM | POA: Diagnosis not present

## 2021-05-17 MED ORDER — IOHEXOL 300 MG/ML  SOLN
75.0000 mL | Freq: Once | INTRAMUSCULAR | Status: AC | PRN
  Administered 2021-05-17: 75 mL via INTRAVENOUS

## 2021-05-20 ENCOUNTER — Inpatient Hospital Stay (HOSPITAL_BASED_OUTPATIENT_CLINIC_OR_DEPARTMENT_OTHER): Payer: Medicare HMO | Admitting: Internal Medicine

## 2021-05-20 ENCOUNTER — Inpatient Hospital Stay: Payer: Medicare HMO

## 2021-05-20 ENCOUNTER — Encounter: Payer: Self-pay | Admitting: Internal Medicine

## 2021-05-20 ENCOUNTER — Other Ambulatory Visit: Payer: Self-pay

## 2021-05-20 VITALS — BP 127/73 | HR 77 | Temp 97.3°F | Resp 18 | Wt 123.4 lb

## 2021-05-20 DIAGNOSIS — M549 Dorsalgia, unspecified: Secondary | ICD-10-CM | POA: Diagnosis not present

## 2021-05-20 DIAGNOSIS — C3411 Malignant neoplasm of upper lobe, right bronchus or lung: Secondary | ICD-10-CM | POA: Diagnosis not present

## 2021-05-20 DIAGNOSIS — R5383 Other fatigue: Secondary | ICD-10-CM | POA: Diagnosis not present

## 2021-05-20 DIAGNOSIS — Z7189 Other specified counseling: Secondary | ICD-10-CM

## 2021-05-20 DIAGNOSIS — Z23 Encounter for immunization: Secondary | ICD-10-CM | POA: Diagnosis not present

## 2021-05-20 DIAGNOSIS — M2550 Pain in unspecified joint: Secondary | ICD-10-CM | POA: Diagnosis not present

## 2021-05-20 DIAGNOSIS — R0602 Shortness of breath: Secondary | ICD-10-CM | POA: Diagnosis not present

## 2021-05-20 DIAGNOSIS — C7951 Secondary malignant neoplasm of bone: Secondary | ICD-10-CM | POA: Diagnosis not present

## 2021-05-20 DIAGNOSIS — C787 Secondary malignant neoplasm of liver and intrahepatic bile duct: Secondary | ICD-10-CM | POA: Diagnosis not present

## 2021-05-20 DIAGNOSIS — C3410 Malignant neoplasm of upper lobe, unspecified bronchus or lung: Secondary | ICD-10-CM

## 2021-05-20 DIAGNOSIS — R946 Abnormal results of thyroid function studies: Secondary | ICD-10-CM | POA: Diagnosis not present

## 2021-05-20 DIAGNOSIS — Z5112 Encounter for antineoplastic immunotherapy: Secondary | ICD-10-CM | POA: Diagnosis not present

## 2021-05-20 LAB — COMPREHENSIVE METABOLIC PANEL
ALT: 10 U/L (ref 0–44)
AST: 18 U/L (ref 15–41)
Albumin: 3.5 g/dL (ref 3.5–5.0)
Alkaline Phosphatase: 64 U/L (ref 38–126)
Anion gap: 5 (ref 5–15)
BUN: 12 mg/dL (ref 8–23)
CO2: 33 mmol/L — ABNORMAL HIGH (ref 22–32)
Calcium: 8.7 mg/dL — ABNORMAL LOW (ref 8.9–10.3)
Chloride: 96 mmol/L — ABNORMAL LOW (ref 98–111)
Creatinine, Ser: 0.49 mg/dL — ABNORMAL LOW (ref 0.61–1.24)
GFR, Estimated: >60 mL/min (ref >60)
Glucose, Bld: 107 mg/dL — ABNORMAL HIGH (ref 70–99)
Potassium: 4.2 mmol/L (ref 3.5–5.1)
Sodium: 134 mmol/L — ABNORMAL LOW (ref 135–145)
Total Bilirubin: 0.8 mg/dL (ref 0.3–1.2)
Total Protein: 7.6 g/dL (ref 6.5–8.1)

## 2021-05-20 LAB — CBC WITH DIFFERENTIAL/PLATELET
Abs Immature Granulocytes: 0.02 10*3/uL (ref 0.00–0.07)
Basophils Absolute: 0 10*3/uL (ref 0.0–0.1)
Basophils Relative: 1 %
Eosinophils Absolute: 0.4 10*3/uL (ref 0.0–0.5)
Eosinophils Relative: 6 %
HCT: 42 % (ref 39.0–52.0)
Hemoglobin: 13.3 g/dL (ref 13.0–17.0)
Immature Granulocytes: 0 %
Lymphocytes Relative: 14 %
Lymphs Abs: 1.1 10*3/uL (ref 0.7–4.0)
MCH: 29.6 pg (ref 26.0–34.0)
MCHC: 31.7 g/dL (ref 30.0–36.0)
MCV: 93.3 fL (ref 80.0–100.0)
Monocytes Absolute: 0.8 10*3/uL (ref 0.1–1.0)
Monocytes Relative: 11 %
Neutro Abs: 5.1 10*3/uL (ref 1.7–7.7)
Neutrophils Relative %: 68 %
Platelets: 255 10*3/uL (ref 150–400)
RBC: 4.5 MIL/uL (ref 4.22–5.81)
RDW: 12.8 % (ref 11.5–15.5)
WBC: 7.5 10*3/uL (ref 4.0–10.5)
nRBC: 0 % (ref 0.0–0.2)

## 2021-05-20 MED ORDER — SODIUM CHLORIDE 0.9 % IV SOLN
200.0000 mg | Freq: Once | INTRAVENOUS | Status: AC
  Administered 2021-05-20: 200 mg via INTRAVENOUS
  Filled 2021-05-20: qty 8

## 2021-05-20 MED ORDER — SODIUM CHLORIDE 0.9 % IV SOLN
Freq: Once | INTRAVENOUS | Status: AC
  Filled 2021-05-20: qty 250

## 2021-05-20 MED ORDER — HEPARIN SOD (PORK) LOCK FLUSH 100 UNIT/ML IV SOLN
500.0000 [IU] | Freq: Once | INTRAVENOUS | Status: AC | PRN
  Administered 2021-05-20: 500 [IU]
  Filled 2021-05-20: qty 5

## 2021-05-20 MED ORDER — SODIUM CHLORIDE 0.9% FLUSH
10.0000 mL | Freq: Once | INTRAVENOUS | Status: AC
Start: 2021-05-20 — End: 2021-05-20
  Administered 2021-05-20: 10 mL via INTRAVENOUS
  Filled 2021-05-20: qty 10

## 2021-05-20 MED ORDER — INFLUENZA VAC A&B SA ADJ QUAD 0.5 ML IM PRSY
0.5000 mL | PREFILLED_SYRINGE | Freq: Once | INTRAMUSCULAR | Status: AC
  Administered 2021-05-20: 0.5 mL via INTRAMUSCULAR
  Filled 2021-05-20: qty 0.5

## 2021-05-20 NOTE — Assessment & Plan Note (Addendum)
#   Stage IV metastatic non-small cell lung cancer favor adeno; mets to bone; liver.  Currently on Keytruda maintenance- 10/23 CT CAP-  Continued increase in masslike consolidation in the posterior right upper lobe, measuring up to 4.9 x 2.4 cm, previously 3.3 x 1.5 cm; and completely new in comparison to prior examination dating back to 08/15/2020. This is highly concerning for local recurrence but in general could reflect development of dense radiation fibrosis. Consider tissue sampling and/or metabolic characterization by PET-CT.  Unchanged treated liver metastases. Plan PET scan ASAP.   # Proceed with [place]. Labs today reviewed;  acceptable for treatment today-.  # Bone metastases- on zometa 3 mg IVPB.  Every 6 weeks. Ca-8.5.Clinically- STABLE> HOLD zomeat today  # left shoulder pain- MSK- recommend NSAIDs/Topical. .Clinically-STABLE.  #Debility-spinal cord compression/status post surgery-s/p physical therapy-. Clinically- STABLE.   # Hypothyroidism- TSH slightlly elevated-9; T4/T3-N [AUG 2022]; but extremely fatigued.  Mid sep started on Synthroid low-dose 25 mcg once a day in the morning; will re-check at next visit  # COPD-.Clinically- STABLE. On 2 lit O2; Trelegy.  # DISPOSITION:  # Flu hot today # PET ASAP #Referral to Dr. Donella Stade regarding: Radiation/right lung-awaiting PET # Keytruda; HOLD Zometa # follow up in 3 weeks MD;labs- cbc/cmp;Thyroid profile; Keytruda;-  ZOMETA;-Dr.B  # I reviewed the blood work- with the patient in detail; also reviewed the imaging independently [as summarized above]; and with the patient in detail.

## 2021-05-20 NOTE — Progress Notes (Signed)
Patient here today for follow up and treatment. No new complaints.

## 2021-05-20 NOTE — Progress Notes (Signed)
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   # MAY 26th-Keytrda x2 cycles; [borderline PS] July 7th-carbo Alimta Keytruda cycle #1  # NGS/MOLECULAR TESTS:   # PALLIATIVE CARE EVALUATION:  # PAIN MANAGEMENT:    DIAGNOSIS:   STAGE:         ;  GOALS:  CURRENT/MOST RECENT THERAPY :     Cancer of upper lobe of right lung ([place])  11/23/2019 Initial Diagnosis   Cancer of upper lobe of right lung ([place])   12/21/2019 -  Chemotherapy   Patient is on Treatment Plan : LUNG CARBOplatin / Pemetrexed / Pembrolizumab q21d Induction x 4 cycles / Maintenance Pemetrexed + Pembrolizumab      HISTORY OF PRESENTING ILLNESS: Thin built frail appearing male patient.  No acute distress.  Accompanied by his daughter.  Nasal cannula oxygen.  Cody [name] 65 y.o.  male patient with metastatic non-small cell lung cancer; cord compression status post decompressive surgery; currently on Keytruda maintenance-is here for follow-up/review results of the CT scan.  Patient complains of mild intermittent pain right posterior chest wall.  He did not have to take any pain medications.  Chronic mild fatigue.  No diarrhea. No nausea no vomiting no diarrhea.   Review of Systems  Constitutional:  Positive for  malaise/fatigue. Negative for chills, diaphoresis and fever.  HENT:  Negative for nosebleeds and sore throat.   Eyes:  Negative for double vision.  Respiratory:  Positive for shortness of breath. Negative for cough, hemoptysis, sputum production and wheezing.   Cardiovascular:  Negative for chest pain, palpitations, orthopnea and leg swelling.  Gastrointestinal:  Negative for abdominal pain, blood in stool, constipation, heartburn, melena, nausea and vomiting.  Genitourinary:  Negative for dysuria, frequency and urgency.  Musculoskeletal:  Positive for back pain and joint pain.  Skin: Negative.  Negative for itching and rash.  Neurological:  Negative for dizziness, tingling, focal weakness, weakness and headaches.  Endo/Heme/Allergies:  Does not bruise/bleed easily.  Psychiatric/Behavioral:  Negative for depression. The patient is not nervous/anxious and does not have insomnia.     MEDICAL HISTORY:  Past Medical History:  Diagnosis Date  . Allergy   . Cancer of upper lobe of right lung ([place]) 10/2019  . COPD (chronic obstructive pulmonary disease) ([place])   . Prostate enlargement     SURGICAL HISTORY: Past Surgical History:  Procedure Laterality Date  . HERNIA REPAIR Bilateral 1982  . HERNIA REPAIR Right 1994  . IR IMAGING GUIDED PORT INSERTION  02/24/2020  . LAMINECTOMY FOR EXCISION / EVACUATION INTRASPINAL LESION  11/07/2019   T1-T 6 at [facility]     SOCIAL HISTORY: Social History   Socioeconomic History  . Marital status: Married    Spouse name: [name]   . Number of children: 4  . Years of education: Not on file  . Highest education level: Some college, no degree  Occupational  History  . Occupation: dispatch and delivery   Tobacco Use  . Smoking status: Former    Packs/day: 2.00    Years: 30.00    Pack years: 60.00    Types: Cigarettes    Quit date: 07/29/2011    Years since quitting: 9.8  . Smokeless tobacco: Never  . Tobacco comments:    pt vapes  Vaping Use  . Vaping  Use: Not on file  Substance and Sexual Activity  . Alcohol use: Yes    Alcohol/week: 5.0 standard drinks    Types: 5 Standard drinks or equivalent per week    Comment: 1-2/day  . Drug use: No  . Sexual activity: Not Currently    Partners: Female  Other Topics Concern  . Not on file  Social History Narrative   Worked in warehouse/ quit smoking in 2015; beer 1/day; in ; with wife.    Social Determinants of Health   Financial Resource Strain: Not on file  Food Insecurity: Not on file  Transportation Needs: Not on file  Physical Activity: Not on file  Stress: Not on file  Social Connections: Not on file  Intimate Partner Violence: Not on file    FAMILY HISTORY: Family History  Problem Relation Age of Onset  . Heart disease Father   . CVA Mother   . Lung cancer Sister   . Lung cancer Brother     ALLERGIES:  has no allergies on file.  MEDICATIONS:  Current Outpatient Medications  Medication Sig Dispense Refill  . acetaminophen (TYLENOL) 500 MG tablet Take 500-1,000 mg by mouth every 6 (six) hours as needed for mild pain or fever.     Marland Kitchen albuterol (VENTOLIN HFA) 108 (90 Base) MCG/ACT inhaler Inhale 2 puffs into the lungs every 4 (four) hours as needed for wheezing or shortness of breath. 18 g 0  . Calcium Carb-Cholecalciferol (OYSTER SHELL CALCIUM) 500-400 MG-UNIT TABS Take 500 tablets by mouth.    . escitalopram (LEXAPRO) 5 MG tablet Take 1 tablet (5 mg total) by mouth daily. am's 90 tablet 1  . Fluticasone-Umeclidin-Vilant (TRELEGY ELLIPTA) 100-62.5-25 MCG/INH AEPB Inhale 1 puff into the lungs daily. 180 each 1  . levothyroxine (SYNTHROID) 25 MCG tablet Take 1 tablet (25 mcg total) by mouth daily before breakfast. 30 tablet 3  . lidocaine-prilocaine (EMLA) cream Apply 1 application topically as needed. Apply small amount to port site at least 1 hour prior to it being accessed, cover with plastic wrap 30 g 1  . Multiple Vitamins-Minerals (MULTIVITAMIN GUMMIES MENS PO)  Take 1 Dose by mouth daily.     . ondansetron (ZOFRAN) 4 MG tablet Take 1 tablet (4 mg total) by mouth every 8 (eight) hours as needed for nausea or vomiting. 20 tablet 2  . prochlorperazine (COMPAZINE) 10 MG tablet Take 1 tablet (10 mg total) by mouth every 6 (six) hours as needed for nausea or vomiting. 40 tablet 1  . QUEtiapine (SEROQUEL) 25 MG tablet Take 1 tablet (25 mg total) by mouth at bedtime. 90 tablet 1   No current facility-administered medications for this visit.   Facility-Administered Medications Ordered in Other Visits  Medication Dose Route Frequency Provider Last Rate Last Admin  . heparin lock flush 100 unit/mL  500 Units Intracatheter Once PRN Charlaine Dalton R, MD      . influenza vaccine adjuvanted (FLUAD) injection 0.5 mL  0.5 mL Intramuscular Once Cammie Sickle, MD      . pembrolizumab [facility]) 200 mg in sodium chloride 0.9 %  50 mL chemo infusion  200 mg Intravenous Once Cammie Sickle, MD   Stopped at 05/20/21 1102      .  PHYSICAL EXAMINATION: ECOG PERFORMANCE STATUS: 3 - Symptomatic, >50% confined to bed  Vitals:   05/20/21 0917  BP: 127/73  Pulse: 77  Resp: 18  Temp: (!) 97.3 F (36.3 C)  SpO2: 99%   Filed Weights   05/20/21 0917  Weight: 123 lb 6.4 oz (56 kg)    Physical Exam HENT:     Head: Normocephalic and atraumatic.     Mouth/Throat:     Pharynx: No oropharyngeal exudate.  Eyes:     Pupils: Pupils are equal, round, and reactive to light.  Cardiovascular:     Rate and Rhythm: Normal rate and regular rhythm.  Pulmonary:     Effort: No respiratory distress.     Breath sounds: No wheezing.     Comments: Decreased air entry bilaterally. Abdominal:     General: Bowel sounds are normal. There is no distension.     Palpations: Abdomen is soft. There is no mass.     Tenderness: There is no abdominal tenderness. There is no guarding or rebound.  Musculoskeletal:        General: No tenderness. Normal range of motion.      Cervical back: Normal range of motion and neck supple.  Skin:    General: Skin is warm.  Neurological:     Mental Status: He is alert and oriented to person, place, and time.  Psychiatric:        Mood and Affect: Affect normal.     LABORATORY DATA:  I have reviewed the data as listed Lab Results  Component Value Date   WBC 7.5 05/20/2021   HGB 13.3 05/20/2021   HCT 42.0 05/20/2021   MCV 93.3 05/20/2021   PLT 255 05/20/2021   Recent Labs    04/08/21 0856 04/29/21 0904 05/20/21 0900  NA 136 134* 134*  K 4.1 4.3 4.2  CL 97* 93* 96*  CO2 34* 33* 33*  GLUCOSE 111* 106* 107*  BUN 10 14 12   CREATININE 0.41* 0.45* 0.49*  CALCIUM 9.0 8.5* 8.7*  GFRNONAA >60 >60 >60  PROT 7.4 7.8 7.6  ALBUMIN 3.6 3.6 3.5  AST 19 26 18   ALT 11 11 10   ALKPHOS 73 71 64  BILITOT 0.6 0.7 0.8    RADIOGRAPHIC STUDIES: I have personally reviewed the radiological images as listed and agreed with the findings in the report. CT CHEST ABDOMEN PELVIS W CONTRAST  Result Date: 05/19/2021 CLINICAL DATA:  Metastatic right upper lobe lung cancer restaging, assess treatment response EXAM: CT CHEST, ABDOMEN, AND PELVIS WITH CONTRAST TECHNIQUE: Multidetector CT imaging of the chest, abdomen and pelvis was performed following the standard protocol during bolus administration of intravenous contrast. CONTRAST:  68m OMNIPAQUE IOHEXOL 300 MG/ML SOLN, additional oral enteric contrast COMPARISON:  12/20/2020 FINDINGS: CT CHEST FINDINGS Cardiovascular: Right chest port catheter. Aortic atherosclerosis. Normal heart size. Left coronary artery calcifications. No pericardial effusion. Mediastinum/Nodes: No enlarged mediastinal, hilar, or axillary lymph nodes. Thyroid gland, trachea, and esophagus demonstrate no significant findings. Lungs/Pleura: Severe centrilobular emphysema. Diffuse bilateral bronchial wall thickening. Continued increase in masslike consolidation in the posterior right upper lobe, measuring up to 4.9 x 2.4  cm, previously 3.3 x 1.5 cm and completely new in comparison to prior examination dating back to 08/15/2020. Musculoskeletal: No chest wall mass. Unchanged bridging fusion of the upper thoracic spine about sclerotic pathologic fractures of  T3 and T4 (series 6, image 95). Sclerosis of the posterior right third, fourth, and fifth ribs underlying mass. CT ABDOMEN PELVIS FINDINGS Hepatobiliary: Unchanged, subtle hypodense lesion of the central right lobe of the liver measuring approximately 1.4 x 1.2 cm (series 2, image 73), with unchanged distal segmental biliary ductal dilatation. Additional subtly hypodense lesions of the liver dome and left lobe are unchanged, measuring 0.9 x 0.9 cm (series 2, image 64) and 0.9 x 0.8 cm respectively (series 2, image 66). No gallstones, gallbladder wall thickening, or biliary dilatation. Pancreas: Unremarkable. No pancreatic ductal dilatation or surrounding inflammatory changes. Spleen: Normal in size without significant abnormality. Adrenals/Urinary Tract: Adrenal glands are unremarkable. Kidneys are normal, without renal calculi, solid lesion, or hydronephrosis. Thickening of the urinary bladder wall, likely related to chronic outlet obstruction. Stomach/Bowel: Stomach is within normal limits. Appendix appears normal. No evidence of bowel wall thickening, distention, or inflammatory changes. Vascular/Lymphatic: Aortic atherosclerosis. No enlarged abdominal or pelvic lymph nodes. Reproductive: Prostatomegaly. Other: No abdominal wall hernia or abnormality. Status post inguinal hernia repair. Probable subcutaneous inclusion cyst in the right groin measuring 2.1 x 1.6 cm (series 2, image 115). No abdominopelvic ascites. Musculoskeletal: No acute or significant osseous findings. IMPRESSION: 1. Continued increase in masslike consolidation in the posterior right upper lobe, measuring up to 4.9 x 2.4 cm, previously 3.3 x 1.5 cm and completely new in comparison to prior examination dating  back to 08/15/2020. This is highly concerning for local recurrence but in general could reflect development of dense radiation fibrosis. Consider tissue sampling and/or metabolic characterization by PET-CT. 2. Unchanged treated liver metastases. 3. Unchanged bridging fusion of the upper thoracic spine about sclerotic pathologic fractures of T3 and T4. 4. Emphysema and diffuse bilateral bronchial wall thickening. 5. Prostatomegaly. 6. Coronary artery disease. Aortic Atherosclerosis (ICD10-I70.0). Electronically Signed   By: Delanna Ahmadi M.D.   On: 05/19/2021 09:40     ASSESSMENT & PLAN:   Cancer of upper lobe of right lung ([place]) # Stage IV metastatic non-small cell lung cancer favor adeno; mets to bone; liver.  Currently on Keytruda maintenance- 10/23 CT CAP-  Continued increase in masslike consolidation in the posterior right upper lobe, measuring up to 4.9 x 2.4 cm, previously 3.3 x 1.5 cm; and completely new in comparison to prior examination dating back to 08/15/2020. This is highly concerning for local recurrence but in general could reflect development of dense radiation fibrosis. Consider tissue sampling and/or metabolic characterization by PET-CT.  Unchanged treated liver metastases. Plan PET scan ASAP.   # Proceed with [place]. Labs today reviewed;  acceptable for treatment today-.  # Bone metastases- on zometa 3 mg IVPB.  Every 6 weeks. Ca-8.5.Clinically- STABLE> HOLD zomeat today  # left shoulder pain- MSK- recommend NSAIDs/Topical. .Clinically-STABLE.  #Debility-spinal cord compression/status post surgery-s/p physical therapy-. Clinically- STABLE.   # Hypothyroidism- TSH slightlly elevated-9; T4/T3-N [AUG 2022]; but extremely fatigued.  Mid sep started on Synthroid low-dose 25 mcg once a day in the morning; will re-check at next visit  # COPD-.Clinically- STABLE. On 2 lit O2; Trelegy.  # DISPOSITION:  # Flu hot today # PET ASAP #Referral to Dr. Donella Stade regarding: Radiation/right  lung-awaiting PET # Keytruda; HOLD Zometa # follow up in 3 weeks MD;labs- cbc/cmp;Thyroid profile; Keytruda;-  ZOMETA;-Dr.B  # I reviewed the blood work- with the patient in detail; also reviewed the imaging independently [as summarized above]; and with the patient in detail.   All questions were answered. The patient knows to call the  clinic with any problems, questions or concerns.    Cammie Sickle, MD 05/20/2021 10:41 AM

## 2021-05-21 LAB — THYROID PANEL WITH TSH
Free Thyroxine Index: 1.7 (ref 1.2–4.9)
T3 Uptake Ratio: 28 % (ref 24–39)
T4, Total: 6 ug/dL (ref 4.5–12.0)
TSH: 6.15 u[IU]/mL — ABNORMAL HIGH (ref 0.450–4.500)

## 2021-05-27 ENCOUNTER — Ambulatory Visit
Admission: RE | Admit: 2021-05-27 | Discharge: 2021-05-27 | Disposition: A | Discharge status: Home / Self Care | Payer: Medicare HMO | Source: Ambulatory Visit | Attending: Radiation Oncology | Admitting: Radiation Oncology

## 2021-05-27 ENCOUNTER — Other Ambulatory Visit: Payer: Self-pay

## 2021-05-27 ENCOUNTER — Encounter: Payer: Self-pay | Admitting: Radiation Oncology

## 2021-05-27 VITALS — BP 117/68 | HR 72 | Temp 96.7°F | Resp 18 | Wt 125.1 lb

## 2021-05-27 DIAGNOSIS — C7951 Secondary malignant neoplasm of bone: Secondary | ICD-10-CM | POA: Diagnosis not present

## 2021-05-27 DIAGNOSIS — Z923 Personal history of irradiation: Secondary | ICD-10-CM | POA: Diagnosis not present

## 2021-05-27 DIAGNOSIS — C3411 Malignant neoplasm of upper lobe, right bronchus or lung: Secondary | ICD-10-CM | POA: Diagnosis not present

## 2021-05-27 DIAGNOSIS — C787 Secondary malignant neoplasm of liver and intrahepatic bile duct: Secondary | ICD-10-CM | POA: Diagnosis not present

## 2021-05-27 NOTE — Progress Notes (Signed)
Radiation Oncology Follow up Note old patient new area progressive right lung mass  Name: Cody [name]   Date:   05/27/2021 MRN:  [id number] DOB: October 12, 1955    This 65 y.o. male presents to the clinic today for evaluation of progressive right lung mass and patient with known stage IV lung cancer with extensive metastatic disease including spine and liver now with progressive lesion in his right posterior lung.  REFERRING PROVIDER: Steele Sizer, MD  HPI: Patient is a 65 year old male well-known to department had received palliative radiation therapy to his spine status post decompression for cord compression at [facility].  He received palliative radiation therapy to his spine back in May 2021.Marland Kitchen  He is currently on Keytruda maintenance and is doing well.  They have been tracking a and increasing posterior right upper lobe lesion measuring 4.9 x 2.4 cm lesion which is progressing over time by CT criteria.  This is highly concerning for local recurrence and a PET scan for tomorrow has been ordered.  He specifically [place] cough hemoptysis or chest tightness.  COMPLICATIONS OF TREATMENT: none  FOLLOW UP COMPLIANCE: keeps appointments   PHYSICAL EXAM:  BP 117/68 (BP Location: Right Arm, Patient Position: Sitting, Cuff Size: Normal)   Pulse 72   Temp (!) 96.7 F (35.9 C) (Tympanic)   Resp 18   Wt 125 lb 1.6 oz (56.7 kg)   SpO2 100%   BMI 18.47 kg/m  [place] male on a nasal oxygen in NAD.  Well-developed well-nourished patient in NAD. HEENT reveals PERLA, EOMI, discs not visualized.  Oral cavity is clear. No oral mucosal lesions are identified. Neck is clear without evidence of cervical or supraclavicular adenopathy. Lungs are clear to A&P. Cardiac examination is essentially unremarkable with regular rate and rhythm without murmur rub or thrill. Abdomen is benign with no organomegaly or masses noted. Motor sensory and DTR levels are equal and symmetric in the upper and lower extremities.  Cranial nerves II through XII are grossly intact. Proprioception is intact. No peripheral adenopathy or edema is identified. No motor or sensory levels are noted. Crude visual fields are within normal range.  RADIOLOGY RESULTS: CT scans reviewed PET CT scans reviewed after it is available tomorrow.  PLAN: At this time should PET CT scan be positive in this posterior region this is an area that has not received prior radiation therapy.  We will plan on treating with 40 Gray in 20 fractions using 3-dimensional treatment planning and evaluate for response.  Risks and benefits of treatment including possible development of a cough skin reaction fatigue alteration of blood counts all were discussed in detail with the patient.  I have put him on the schedule for later this week for simulation.  We will review PET CT scan prior to simulation.  Patient comprehends my recommendations well.  I would like to take this opportunity to thank you for allowing me to participate in the care of your patient.Noreene Filbert, MD

## 2021-05-27 NOTE — Addendum Note (Signed)
Encounter addended by: Noreene Filbert, MD on: 05/27/2021 4:22 PM  Actions taken: Clinical Note Signed

## 2021-05-28 ENCOUNTER — Ambulatory Visit
Admission: RE | Admit: 2021-05-28 | Discharge: 2021-05-28 | Disposition: A | Discharge status: Home / Self Care | Payer: Medicare HMO | Source: Ambulatory Visit | Attending: Internal Medicine | Admitting: Internal Medicine

## 2021-05-28 DIAGNOSIS — J439 Emphysema, unspecified: Secondary | ICD-10-CM | POA: Diagnosis not present

## 2021-05-28 DIAGNOSIS — C787 Secondary malignant neoplasm of liver and intrahepatic bile duct: Secondary | ICD-10-CM | POA: Diagnosis not present

## 2021-05-28 DIAGNOSIS — I2510 Atherosclerotic heart disease of native coronary artery without angina pectoris: Secondary | ICD-10-CM | POA: Insufficient documentation

## 2021-05-28 DIAGNOSIS — C3411 Malignant neoplasm of upper lobe, right bronchus or lung: Secondary | ICD-10-CM | POA: Insufficient documentation

## 2021-05-28 DIAGNOSIS — I700 Atherosclerosis of aorta: Secondary | ICD-10-CM | POA: Diagnosis not present

## 2021-05-28 DIAGNOSIS — R222 Localized swelling, mass and lump, trunk: Secondary | ICD-10-CM | POA: Insufficient documentation

## 2021-05-28 DIAGNOSIS — J432 Centrilobular emphysema: Secondary | ICD-10-CM | POA: Diagnosis not present

## 2021-05-28 DIAGNOSIS — N400 Enlarged prostate without lower urinary tract symptoms: Secondary | ICD-10-CM | POA: Diagnosis not present

## 2021-05-28 DIAGNOSIS — C3490 Malignant neoplasm of unspecified part of unspecified bronchus or lung: Secondary | ICD-10-CM | POA: Diagnosis not present

## 2021-05-28 LAB — GLUCOSE, CAPILLARY: Glucose-Capillary: 83 mg/dL (ref 70–99)

## 2021-05-28 MED ORDER — FLUDEOXYGLUCOSE F - 18 (FDG) INJECTION
7.0100 | Freq: Once | INTRAVENOUS | Status: AC | PRN
  Administered 2021-05-28: 7.01 via INTRAVENOUS

## 2021-05-30 ENCOUNTER — Ambulatory Visit
Admission: RE | Admit: 2021-05-30 | Discharge: 2021-05-30 | Disposition: A | Discharge status: Home / Self Care | Payer: Medicare HMO | Source: Ambulatory Visit | Attending: Radiation Oncology | Admitting: Radiation Oncology

## 2021-05-30 DIAGNOSIS — Z510 Encounter for antineoplastic radiation therapy: Secondary | ICD-10-CM | POA: Insufficient documentation

## 2021-05-30 DIAGNOSIS — C3411 Malignant neoplasm of upper lobe, right bronchus or lung: Secondary | ICD-10-CM | POA: Diagnosis not present

## 2021-05-30 DIAGNOSIS — C787 Secondary malignant neoplasm of liver and intrahepatic bile duct: Secondary | ICD-10-CM | POA: Diagnosis not present

## 2021-05-30 DIAGNOSIS — C7951 Secondary malignant neoplasm of bone: Secondary | ICD-10-CM | POA: Insufficient documentation

## 2021-06-01 DIAGNOSIS — J9601 Acute respiratory failure with hypoxia: Secondary | ICD-10-CM | POA: Diagnosis not present

## 2021-06-01 DIAGNOSIS — J441 Chronic obstructive pulmonary disease with (acute) exacerbation: Secondary | ICD-10-CM | POA: Diagnosis not present

## 2021-06-04 ENCOUNTER — Encounter: Payer: Self-pay | Admitting: Internal Medicine

## 2021-06-04 NOTE — Progress Notes (Signed)
Spoke to patient regarding results of the pet scan. Currently awaiting to start radiation next week. Will discuss further at the next office visit.

## 2021-06-05 DIAGNOSIS — C3411 Malignant neoplasm of upper lobe, right bronchus or lung: Secondary | ICD-10-CM | POA: Diagnosis not present

## 2021-06-05 DIAGNOSIS — C7951 Secondary malignant neoplasm of bone: Secondary | ICD-10-CM | POA: Diagnosis not present

## 2021-06-05 DIAGNOSIS — Z510 Encounter for antineoplastic radiation therapy: Secondary | ICD-10-CM | POA: Diagnosis not present

## 2021-06-05 DIAGNOSIS — C787 Secondary malignant neoplasm of liver and intrahepatic bile duct: Secondary | ICD-10-CM | POA: Diagnosis not present

## 2021-06-06 ENCOUNTER — Ambulatory Visit: Admission: RE | Admit: 2021-06-06 | Payer: Medicare HMO | Source: Ambulatory Visit

## 2021-06-06 DIAGNOSIS — C787 Secondary malignant neoplasm of liver and intrahepatic bile duct: Secondary | ICD-10-CM | POA: Diagnosis not present

## 2021-06-06 DIAGNOSIS — C3411 Malignant neoplasm of upper lobe, right bronchus or lung: Secondary | ICD-10-CM | POA: Diagnosis not present

## 2021-06-06 DIAGNOSIS — Z510 Encounter for antineoplastic radiation therapy: Secondary | ICD-10-CM | POA: Diagnosis not present

## 2021-06-06 DIAGNOSIS — C7951 Secondary malignant neoplasm of bone: Secondary | ICD-10-CM | POA: Diagnosis not present

## 2021-06-10 ENCOUNTER — Inpatient Hospital Stay (HOSPITAL_BASED_OUTPATIENT_CLINIC_OR_DEPARTMENT_OTHER): Payer: Medicare HMO | Admitting: Internal Medicine

## 2021-06-10 ENCOUNTER — Ambulatory Visit
Admission: RE | Admit: 2021-06-10 | Discharge: 2021-06-10 | Disposition: A | Discharge status: Home / Self Care | Payer: Medicare HMO | Source: Ambulatory Visit | Attending: Radiation Oncology | Admitting: Radiation Oncology

## 2021-06-10 ENCOUNTER — Other Ambulatory Visit: Payer: Self-pay

## 2021-06-10 ENCOUNTER — Inpatient Hospital Stay: Payer: Medicare HMO | Attending: Internal Medicine

## 2021-06-10 ENCOUNTER — Inpatient Hospital Stay: Payer: Medicare HMO

## 2021-06-10 DIAGNOSIS — C3411 Malignant neoplasm of upper lobe, right bronchus or lung: Secondary | ICD-10-CM | POA: Diagnosis not present

## 2021-06-10 DIAGNOSIS — I2510 Atherosclerotic heart disease of native coronary artery without angina pectoris: Secondary | ICD-10-CM | POA: Insufficient documentation

## 2021-06-10 DIAGNOSIS — R5383 Other fatigue: Secondary | ICD-10-CM | POA: Insufficient documentation

## 2021-06-10 DIAGNOSIS — M2550 Pain in unspecified joint: Secondary | ICD-10-CM | POA: Diagnosis not present

## 2021-06-10 DIAGNOSIS — E039 Hypothyroidism, unspecified: Secondary | ICD-10-CM | POA: Insufficient documentation

## 2021-06-10 DIAGNOSIS — Z79890 Hormone replacement therapy (postmenopausal): Secondary | ICD-10-CM | POA: Insufficient documentation

## 2021-06-10 DIAGNOSIS — R0602 Shortness of breath: Secondary | ICD-10-CM | POA: Diagnosis not present

## 2021-06-10 DIAGNOSIS — Z823 Family history of stroke: Secondary | ICD-10-CM | POA: Diagnosis not present

## 2021-06-10 DIAGNOSIS — R972 Elevated prostate specific antigen [PSA]: Secondary | ICD-10-CM | POA: Diagnosis not present

## 2021-06-10 DIAGNOSIS — Z87891 Personal history of nicotine dependence: Secondary | ICD-10-CM | POA: Insufficient documentation

## 2021-06-10 DIAGNOSIS — Z510 Encounter for antineoplastic radiation therapy: Secondary | ICD-10-CM | POA: Diagnosis not present

## 2021-06-10 DIAGNOSIS — J432 Centrilobular emphysema: Secondary | ICD-10-CM | POA: Diagnosis not present

## 2021-06-10 DIAGNOSIS — Z801 Family history of malignant neoplasm of trachea, bronchus and lung: Secondary | ICD-10-CM | POA: Diagnosis not present

## 2021-06-10 DIAGNOSIS — Z8249 Family history of ischemic heart disease and other diseases of the circulatory system: Secondary | ICD-10-CM | POA: Diagnosis not present

## 2021-06-10 DIAGNOSIS — K838 Other specified diseases of biliary tract: Secondary | ICD-10-CM | POA: Diagnosis not present

## 2021-06-10 DIAGNOSIS — C7989 Secondary malignant neoplasm of other specified sites: Secondary | ICD-10-CM | POA: Diagnosis not present

## 2021-06-10 DIAGNOSIS — C787 Secondary malignant neoplasm of liver and intrahepatic bile duct: Secondary | ICD-10-CM | POA: Insufficient documentation

## 2021-06-10 DIAGNOSIS — C7951 Secondary malignant neoplasm of bone: Secondary | ICD-10-CM | POA: Diagnosis not present

## 2021-06-10 DIAGNOSIS — Z95828 Presence of other vascular implants and grafts: Secondary | ICD-10-CM

## 2021-06-10 DIAGNOSIS — Z79899 Other long term (current) drug therapy: Secondary | ICD-10-CM | POA: Diagnosis not present

## 2021-06-10 DIAGNOSIS — N400 Enlarged prostate without lower urinary tract symptoms: Secondary | ICD-10-CM | POA: Diagnosis not present

## 2021-06-10 DIAGNOSIS — R54 Age-related physical debility: Secondary | ICD-10-CM | POA: Diagnosis not present

## 2021-06-10 DIAGNOSIS — M549 Dorsalgia, unspecified: Secondary | ICD-10-CM | POA: Diagnosis not present

## 2021-06-10 LAB — CBC WITH DIFFERENTIAL/PLATELET
Abs Immature Granulocytes: 0.04 10*3/uL (ref 0.00–0.07)
Basophils Absolute: 0.1 10*3/uL (ref 0.0–0.1)
Basophils Relative: 1 %
Eosinophils Absolute: 0.3 10*3/uL (ref 0.0–0.5)
Eosinophils Relative: 4 %
HCT: 43.4 % (ref 39.0–52.0)
Hemoglobin: 13.9 g/dL (ref 13.0–17.0)
Immature Granulocytes: 1 %
Lymphocytes Relative: 14 %
Lymphs Abs: 1.1 10*3/uL (ref 0.7–4.0)
MCH: 29.4 pg (ref 26.0–34.0)
MCHC: 32 g/dL (ref 30.0–36.0)
MCV: 91.9 fL (ref 80.0–100.0)
Monocytes Absolute: 0.8 10*3/uL (ref 0.1–1.0)
Monocytes Relative: 10 %
Neutro Abs: 6 10*3/uL (ref 1.7–7.7)
Neutrophils Relative %: 70 %
Platelets: 292 10*3/uL (ref 150–400)
RBC: 4.72 MIL/uL (ref 4.22–5.81)
RDW: 12.8 % (ref 11.5–15.5)
WBC: 8.4 10*3/uL (ref 4.0–10.5)
nRBC: 0 % (ref 0.0–0.2)

## 2021-06-10 LAB — COMPREHENSIVE METABOLIC PANEL
ALT: 14 U/L (ref 0–44)
AST: 23 U/L (ref 15–41)
Albumin: 3.7 g/dL (ref 3.5–5.0)
Alkaline Phosphatase: 76 U/L (ref 38–126)
Anion gap: 8 (ref 5–15)
BUN: 21 mg/dL (ref 8–23)
CO2: 33 mmol/L — ABNORMAL HIGH (ref 22–32)
Calcium: 8.7 mg/dL — ABNORMAL LOW (ref 8.9–10.3)
Chloride: 94 mmol/L — ABNORMAL LOW (ref 98–111)
Creatinine, Ser: 0.54 mg/dL — ABNORMAL LOW (ref 0.61–1.24)
GFR, Estimated: >60 mL/min (ref >60)
Glucose, Bld: 102 mg/dL — ABNORMAL HIGH (ref 70–99)
Potassium: 4.3 mmol/L (ref 3.5–5.1)
Sodium: 135 mmol/L (ref 135–145)
Total Bilirubin: 0.3 mg/dL (ref 0.3–1.2)
Total Protein: 8.2 g/dL — ABNORMAL HIGH (ref 6.5–8.1)

## 2021-06-10 MED ORDER — HEPARIN SOD (PORK) LOCK FLUSH 100 UNIT/ML IV SOLN
INTRAVENOUS | Status: AC
  Administered 2021-06-10: 500 [IU] via INTRAVENOUS
  Filled 2021-06-10: qty 5

## 2021-06-10 MED ORDER — HEPARIN SOD (PORK) LOCK FLUSH 100 UNIT/ML IV SOLN
500.0000 [IU] | Freq: Once | INTRAVENOUS | Status: AC
  Filled 2021-06-10: qty 5

## 2021-06-10 NOTE — Assessment & Plan Note (Addendum)
#   Stage IV metastatic non-small cell lung cancer favor adeno; mets to bone; liver.  Currently on Keytruda maintenance- . NOV 2dn, 2022-  Unchanged treated liver metastases; shows subpleural soft tissue metastases posterior aspect of the right upper lobe with involvement of the adjacent ribs.  Given the oligometastatic progression-continue radiation until December 13th.    #Given the risk of pneumonitis with concurrent radiation reasonable to [facility]. Labs today reviewed; on RT until 12/13.   # Bone metastases- on zometa 3 mg IVPB.  Every 6 weeks. Ca-8.5.Clinically- STABLE> HOLD zomeat today  # left shoulder pain- MSK- recommend NSAIDs/Topical. .Clinically-STABLE.  #Debility-spinal cord compression/status post surgery-s/p physical therapy-. Clinically- STABLE.   # Hypothyroidism- TSH slightlly elevated-9; T4/T3-N [AUG 2022]; but extremely fatigued.  Mid sep started on Synthroid low-dose 25 mcg once a day in the morning- OCT 2022-TSH 6.  Normal T3-T4.  Repeat labs in January.  # COPD-.Clinically- STABLE. On 2 lit O2; Trelegy.  # DISPOSITION:  # HOLD keytruda/Zometa today; de-access # follow up in 1st week of JAN MD;labs- cbc/cmp; Keytruda;-  ZOMETA;thyroid profile -Dr.B  # I reviewed the blood work- with the patient in detail; also reviewed the imaging independently [as summarized above]; and with the patient in detail.    Cody [name]

## 2021-06-10 NOTE — Progress Notes (Signed)
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   # MAY 26th-Keytrda x2 cycles; [borderline PS] July 7th-carbo Alimta Keytruda cycle #1  # NGS/MOLECULAR TESTS:   # PALLIATIVE CARE EVALUATION:  # PAIN MANAGEMENT:    DIAGNOSIS:   STAGE:         ;  GOALS:  CURRENT/MOST RECENT THERAPY :     Cancer of upper lobe of right lung ([place])  11/23/2019 Initial Diagnosis   Cancer of upper lobe of right lung ([place])   12/21/2019 -  Chemotherapy   Patient is on Treatment Plan : LUNG CARBOplatin / Pemetrexed / Pembrolizumab q21d Induction x 4 cycles / Maintenance Pemetrexed + Pembrolizumab      HISTORY OF PRESENTING ILLNESS: Thin built frail appearing male patient.  No acute distress.  Accompanied by his wife.   Nasal cannula oxygen.  Cody [name] 65 y.o.  male patient with metastatic non-small cell lung cancer; cord compression status post decompressive surgery; currently on Keytruda maintenance-is here for follow-up/review results of the PET scan.  Patient started his radiation this morning.  Patient denies any worsening pain.  Chronic mild fatigue.  No diarrhea. No nausea no vomiting no diarrhea.   Review of Systems  Constitutional:  Positive for malaise/fatigue. Negative for chills,  diaphoresis and fever.  HENT:  Negative for nosebleeds and sore throat.   Eyes:  Negative for double vision.  Respiratory:  Positive for shortness of breath. Negative for cough, hemoptysis, sputum production and wheezing.   Cardiovascular:  Negative for chest pain, palpitations, orthopnea and leg swelling.  Gastrointestinal:  Negative for abdominal pain, blood in stool, constipation, heartburn, melena, nausea and vomiting.  Genitourinary:  Negative for dysuria, frequency and urgency.  Musculoskeletal:  Positive for back pain and joint pain.  Skin: Negative.  Negative for itching and rash.  Neurological:  Negative for dizziness, tingling, focal weakness, weakness and headaches.  Endo/Heme/Allergies:  Does not bruise/bleed easily.  Psychiatric/Behavioral:  Negative for depression. The patient is not nervous/anxious and does not have insomnia.     MEDICAL HISTORY:  Past Medical History:  Diagnosis Date  . Allergy   . Cancer of upper lobe of right lung ([place]) 10/2019  . COPD (chronic obstructive pulmonary disease) ([place])   . Prostate enlargement     SURGICAL HISTORY: Past Surgical History:  Procedure Laterality Date  . HERNIA REPAIR Bilateral 1982  . HERNIA REPAIR Right 1994  . IR IMAGING GUIDED PORT INSERTION  02/24/2020  . LAMINECTOMY FOR EXCISION / EVACUATION INTRASPINAL LESION  11/07/2019   T1-T 6 at [facility]     SOCIAL HISTORY: Social History   Socioeconomic History  . Marital status: Married    Spouse name: [name]   . Number of children: 4  . Years of education: Not on file  . Highest education level: Some college, no degree  Occupational History  . Occupation: dispatch and delivery  Tobacco Use  . Smoking status: Former    Packs/day: 2.00    Years: 30.00    Pack years: 60.00    Types: Cigarettes    Quit date: 07/29/2011    Years since quitting: 9.8  . Smokeless tobacco: Never  . Tobacco comments:    pt vapes  Vaping Use  . Vaping Use: Not on file  Substance and  Sexual Activity  . Alcohol use: Yes    Alcohol/week: 5.0 standard drinks    Types: 5 Standard drinks or equivalent per week    Comment: 1-2/day  . Drug use: No  . Sexual activity: Not Currently    Partners: Female  Other Topics Concern  . Not on file  Social History Narrative   Worked in warehouse/ quit smoking in 2015; beer 1/day; in [place]; with wife.    Social Determinants of Health   Financial Resource Strain: Not on file  Food Insecurity: Not on file  Transportation Needs: Not on file  Physical Activity: Not on file  Stress: Not on file  Social Connections: Not on file  Intimate Partner Violence: Not on file    FAMILY HISTORY: Family History  Problem Relation Age of Onset  . Heart disease Father   . CVA Mother   . Lung cancer Sister   . Lung cancer Brother     ALLERGIES:  has no allergies on file.  MEDICATIONS:  Current Outpatient Medications  Medication Sig Dispense Refill  . acetaminophen (TYLENOL) 500 MG tablet Take 500-1,000 mg by mouth every 6 (six) hours as needed for mild pain or fever.     Marland Kitchen albuterol (VENTOLIN HFA) 108 (90 Base) MCG/ACT inhaler Inhale 2 puffs into the lungs every 4 (four) hours as needed for wheezing or shortness of breath. 18 g 0  . Calcium Carb-Cholecalciferol (OYSTER SHELL CALCIUM) 500-400 MG-UNIT TABS Take 500 tablets by mouth.    . escitalopram (LEXAPRO) 5 MG tablet Take 1 tablet (5 mg total) by mouth daily. am's 90 tablet 1  . Fluticasone-Umeclidin-Vilant (TRELEGY ELLIPTA) 100-62.5-25 MCG/INH AEPB Inhale 1 puff into the lungs daily. 180 each 1  . levothyroxine (SYNTHROID) 25 MCG tablet Take 1 tablet (25 mcg total) by mouth daily before breakfast. 30 tablet 3  . lidocaine-prilocaine (EMLA) cream Apply 1 application topically as needed. Apply small amount to port site at least 1 hour prior to it being accessed, cover with plastic wrap 30 g 1  . Multiple Vitamins-Minerals (MULTIVITAMIN GUMMIES MENS PO) Take 1 Dose by mouth daily.      . ondansetron (ZOFRAN) 4 MG tablet Take 1 tablet (4 mg total) by mouth every 8 (eight) hours as needed for nausea or vomiting. 20 tablet 2  . prochlorperazine (COMPAZINE) 10 MG tablet Take 1 tablet (10 mg total) by mouth every 6 (six) hours as needed for nausea or vomiting. 40 tablet 1  . QUEtiapine (SEROQUEL) 25 MG tablet Take 1 tablet (25 mg total) by mouth at bedtime. 90 tablet 1   No current facility-administered medications for this visit.      Marland Kitchen  PHYSICAL EXAMINATION: ECOG PERFORMANCE STATUS: 3 - Symptomatic, >50% confined to bed  Vitals:   06/10/21 0857  BP: 139/79  Pulse: 87  Resp: 18  Temp: (!) 96.6 F (35.9 C)   Filed Weights   06/10/21 0857  Weight: 124 lb 14.4 oz (56.7 kg)    Physical Exam HENT:     Head: Normocephalic and atraumatic.     Mouth/Throat:  Pharynx: No oropharyngeal exudate.  Eyes:     Pupils: Pupils are equal, round, and reactive to light.  Cardiovascular:     Rate and Rhythm: Normal rate and regular rhythm.  Pulmonary:     Effort: No respiratory distress.     Breath sounds: No wheezing.     Comments: Decreased air entry bilaterally. Abdominal:     General: Bowel sounds are normal. There is no distension.     Palpations: Abdomen is soft. There is no mass.     Tenderness: There is no abdominal tenderness. There is no guarding or rebound.  Musculoskeletal:        General: No tenderness. Normal range of motion.     Cervical back: Normal range of motion and neck supple.  Skin:    General: Skin is warm.  Neurological:     Mental Status: He is alert and oriented to person, place, and time.  Psychiatric:        Mood and Affect: Affect normal.     LABORATORY DATA:  I have reviewed the data as listed Lab Results  Component Value Date   WBC 8.4 06/10/2021   HGB 13.9 06/10/2021   HCT 43.4 06/10/2021   MCV 91.9 06/10/2021   PLT 292 06/10/2021   Recent Labs    04/29/21 0904 05/20/21 0900 06/10/21 0844  NA 134* 134* 135  K 4.3  4.2 4.3  CL 93* 96* 94*  CO2 33* 33* 33*  GLUCOSE 106* 107* 102*  BUN 14 12 21   CREATININE 0.45* 0.49* 0.54*  CALCIUM 8.5* 8.7* 8.7*  GFRNONAA >60 >60 >60  PROT 7.8 7.6 8.2*  ALBUMIN 3.6 3.5 3.7  AST 26 18 23   ALT 11 10 14   ALKPHOS 71 64 76  BILITOT 0.7 0.8 0.3    RADIOGRAPHIC STUDIES: I have personally reviewed the radiological images as listed and agreed with the findings in the report. CT CHEST ABDOMEN PELVIS W CONTRAST  Result Date: 05/19/2021 CLINICAL DATA:  Metastatic right upper lobe lung cancer restaging, assess treatment response EXAM: CT CHEST, ABDOMEN, AND PELVIS WITH CONTRAST TECHNIQUE: Multidetector CT imaging of the chest, abdomen and pelvis was performed following the standard protocol during bolus administration of intravenous contrast. CONTRAST:  61m OMNIPAQUE IOHEXOL 300 MG/ML SOLN, additional oral enteric contrast COMPARISON:  12/20/2020 FINDINGS: CT CHEST FINDINGS Cardiovascular: Right chest port catheter. Aortic atherosclerosis. Normal heart size. Left coronary artery calcifications. No pericardial effusion. Mediastinum/Nodes: No enlarged mediastinal, hilar, or axillary lymph nodes. Thyroid gland, trachea, and esophagus demonstrate no significant findings. Lungs/Pleura: Severe centrilobular emphysema. Diffuse bilateral bronchial wall thickening. Continued increase in masslike consolidation in the posterior right upper lobe, measuring up to 4.9 x 2.4 cm, previously 3.3 x 1.5 cm and completely new in comparison to prior examination dating back to 08/15/2020. Musculoskeletal: No chest wall mass. Unchanged bridging fusion of the upper thoracic spine about sclerotic pathologic fractures of T3 and T4 (series 6, image 95). Sclerosis of the posterior right third, fourth, and fifth ribs underlying mass. CT ABDOMEN PELVIS FINDINGS Hepatobiliary: Unchanged, subtle hypodense lesion of the central right lobe of the liver measuring approximately 1.4 x 1.2 cm (series 2, image 73), with  unchanged distal segmental biliary ductal dilatation. Additional subtly hypodense lesions of the liver dome and left lobe are unchanged, measuring 0.9 x 0.9 cm (series 2, image 64) and 0.9 x 0.8 cm respectively (series 2, image 66). No gallstones, gallbladder wall thickening, or biliary dilatation. Pancreas: Unremarkable. No pancreatic ductal dilatation or surrounding inflammatory  changes. Spleen: Normal in size without significant abnormality. Adrenals/Urinary Tract: Adrenal glands are unremarkable. Kidneys are normal, without renal calculi, solid lesion, or hydronephrosis. Thickening of the urinary bladder wall, likely related to chronic outlet obstruction. Stomach/Bowel: Stomach is within normal limits. Appendix appears normal. No evidence of bowel wall thickening, distention, or inflammatory changes. Vascular/Lymphatic: Aortic atherosclerosis. No enlarged abdominal or pelvic lymph nodes. Reproductive: Prostatomegaly. Other: No abdominal wall hernia or abnormality. Status post inguinal hernia repair. Probable subcutaneous inclusion cyst in the right groin measuring 2.1 x 1.6 cm (series 2, image 115). No abdominopelvic ascites. Musculoskeletal: No acute or significant osseous findings. IMPRESSION: 1. Continued increase in masslike consolidation in the posterior right upper lobe, measuring up to 4.9 x 2.4 cm, previously 3.3 x 1.5 cm and completely new in comparison to prior examination dating back to 08/15/2020. This is highly concerning for local recurrence but in general could reflect development of dense radiation fibrosis. Consider tissue sampling and/or metabolic characterization by PET-CT. 2. Unchanged treated liver metastases. 3. Unchanged bridging fusion of the upper thoracic spine about sclerotic pathologic fractures of T3 and T4. 4. Emphysema and diffuse bilateral bronchial wall thickening. 5. Prostatomegaly. 6. Coronary artery disease. Aortic Atherosclerosis (ICD10-I70.0). Electronically Signed   By:  Delanna Ahmadi M.D.   On: 05/19/2021 09:40   NM PET Image Restage (PS) Skull Base to Thigh (F-18 FDG)  Result Date: 05/29/2021 CLINICAL DATA:  Subsequent treatment strategy for lung cancer. EXAM: NUCLEAR MEDICINE PET SKULL BASE TO THIGH TECHNIQUE: 7.0 mCi F-18 FDG was injected intravenously. Full-ring PET imaging was performed from the skull base to thigh after the radiotracer. CT data was obtained and used for attenuation correction and anatomic localization. Fasting blood glucose: 83 mg/dl COMPARISON:  CT chest 05/17/2021 and PET 12/06/2019. FINDINGS: Mediastinal blood pool activity: SUV max 1.7 Liver activity: SUV max NA NECK: No abnormal hypermetabolism. Incidental CT findings: None. CHEST: Soft tissue mass in the posterior upper right hemithorax is seen with sclerosis and periosteal reaction involving the adjacent posterior right third, fourth and fifth ribs, associated SUV max 13.2. Mass appears to extend beyond the chest wall (3/86). Associated nodular pleural thickening with a minimally hypermetabolic component in the anterolateral right upper lobe (3/92), SUV max 4.6, decreased from 5.4 previously. No hypermetabolic mediastinal, hilar or axillary lymph nodes. Incidental CT findings: Right IJ Port-A-Cath terminates in the right atrium. Atherosclerotic calcification of the aorta, aortic valve and coronary arteries. Heart size normal. No pericardial or pleural effusion. Extensive centrilobular and paraseptal emphysema. ABDOMEN/PELVIS: No abnormal hypermetabolism in the liver, adrenal glands, spleen or pancreas. Mild hypermetabolism in the proximal stomach, SUV max 3.3. This area is under distended on CT. No hypermetabolic lymph nodes. Incidental CT findings: Liver, gallbladder, adrenal glands and right kidney are unremarkable. Scarring in the left kidney. Visualized portions of the spleen, pancreas, stomach and bowel are grossly unremarkable. Enlarged prostate. SKELETON: As discussed above, there is  sclerosis and periosteal reaction involving the right third, fourth and fifth posterolateral ribs, engulfed by a hypermetabolic, invasive appearing subpleural mass in the right upper lobe. No additional abnormal osseous hypermetabolism. Incidental CT findings: Degenerative changes in the spine. IMPRESSION: 1. Hypermetabolic subpleural soft tissue mass in the posterior aspect of the apical segment right upper lobe appears to extend beyond the chest wall, compatible with disease recurrence. Associated sclerosis and periosteal reaction involving the adjacent right posterolateral third, fourth and fifth ribs. No evidence of distant metastatic disease. 2. Vague mild hypermetabolism the proximal stomach. Comparison with CT is challenging  due to lack of distension and lack of IV contrast. Please correlate clinically. 3. Treated liver metastases are better seen on prior exams. 4. Enlarged prostate. 5. Aortic atherosclerosis (ICD10-I70.0). Coronary artery calcification. 6.  Emphysema (ICD10-J43.9). Electronically Signed   By: Lorin Picket M.D.   On: 05/29/2021 09:18     ASSESSMENT & PLAN:   Cancer of upper lobe of right lung ([place]) # Stage IV metastatic non-small cell lung cancer favor adeno; mets to bone; liver.  Currently on Keytruda maintenance- . NOV 2dn, 2022-  Unchanged treated liver metastases; shows subpleural soft tissue metastases posterior aspect of the right upper lobe with involvement of the adjacent ribs.  Given the oligometastatic progression-continue radiation until December 13th.    #Given the risk of pneumonitis with concurrent radiation reasonable to [facility]. Labs today reviewed; on RT until 12/13.   # Bone metastases- on zometa 3 mg IVPB.  Every 6 weeks. Ca-8.5.Clinically- STABLE> HOLD zomeat today  # left shoulder pain- [facility]- recommend NSAIDs/Topical. .Clinically-STABLE.  #Debility-spinal cord compression/status post surgery-s/p physical therapy-. Clinically- STABLE.   #  Hypothyroidism- TSH slightlly elevated-9; T4/T3-N [AUG 2022]; but extremely fatigued.  Mid sep started on Synthroid low-dose 25 mcg once a day in the morning- OCT 2022-TSH 6.  Normal T3-T4.  Repeat labs in January.  # COPD-.Clinically- STABLE. On 2 lit O2; Trelegy.  # DISPOSITION:  # HOLD keytruda/Zometa today; de-access # follow up in 1st week of JAN MD;labs- cbc/cmp; Keytruda;-  ZOMETA;thyroid profile -Dr.B  # I reviewed the blood work- with the patient in detail; also reviewed the imaging independently [as summarized above]; and with the patient in detail.    .   All questions were answered. The patient knows to call the clinic with any problems, questions or concerns.    Cammie Sickle, MD 06/10/2021 12:34 PM

## 2021-06-10 NOTE — Progress Notes (Signed)
Patient has occasional sharp pain in upper back that only lasts a few seconds.

## 2021-06-11 ENCOUNTER — Ambulatory Visit
Admission: RE | Admit: 2021-06-11 | Discharge: 2021-06-11 | Disposition: A | Discharge status: Home / Self Care | Payer: Medicare HMO | Source: Ambulatory Visit | Attending: Radiation Oncology | Admitting: Radiation Oncology

## 2021-06-11 DIAGNOSIS — C3411 Malignant neoplasm of upper lobe, right bronchus or lung: Secondary | ICD-10-CM | POA: Diagnosis not present

## 2021-06-11 DIAGNOSIS — C787 Secondary malignant neoplasm of liver and intrahepatic bile duct: Secondary | ICD-10-CM | POA: Diagnosis not present

## 2021-06-11 DIAGNOSIS — C7951 Secondary malignant neoplasm of bone: Secondary | ICD-10-CM | POA: Diagnosis not present

## 2021-06-11 DIAGNOSIS — Z510 Encounter for antineoplastic radiation therapy: Secondary | ICD-10-CM | POA: Diagnosis not present

## 2021-06-12 ENCOUNTER — Ambulatory Visit
Admission: RE | Admit: 2021-06-12 | Discharge: 2021-06-12 | Disposition: A | Discharge status: Home / Self Care | Payer: Medicare HMO | Source: Ambulatory Visit | Attending: Radiation Oncology | Admitting: Radiation Oncology

## 2021-06-12 DIAGNOSIS — C3411 Malignant neoplasm of upper lobe, right bronchus or lung: Secondary | ICD-10-CM | POA: Diagnosis not present

## 2021-06-12 DIAGNOSIS — C7951 Secondary malignant neoplasm of bone: Secondary | ICD-10-CM | POA: Diagnosis not present

## 2021-06-12 DIAGNOSIS — Z510 Encounter for antineoplastic radiation therapy: Secondary | ICD-10-CM | POA: Diagnosis not present

## 2021-06-12 DIAGNOSIS — C787 Secondary malignant neoplasm of liver and intrahepatic bile duct: Secondary | ICD-10-CM | POA: Diagnosis not present

## 2021-06-13 ENCOUNTER — Ambulatory Visit
Admission: RE | Admit: 2021-06-13 | Discharge: 2021-06-13 | Disposition: A | Discharge status: Home / Self Care | Payer: Medicare HMO | Source: Ambulatory Visit | Attending: Radiation Oncology | Admitting: Radiation Oncology

## 2021-06-13 DIAGNOSIS — Z510 Encounter for antineoplastic radiation therapy: Secondary | ICD-10-CM | POA: Diagnosis not present

## 2021-06-13 DIAGNOSIS — C3411 Malignant neoplasm of upper lobe, right bronchus or lung: Secondary | ICD-10-CM | POA: Diagnosis not present

## 2021-06-13 DIAGNOSIS — C7951 Secondary malignant neoplasm of bone: Secondary | ICD-10-CM | POA: Diagnosis not present

## 2021-06-13 DIAGNOSIS — C787 Secondary malignant neoplasm of liver and intrahepatic bile duct: Secondary | ICD-10-CM | POA: Diagnosis not present

## 2021-06-14 ENCOUNTER — Ambulatory Visit
Admission: RE | Admit: 2021-06-14 | Discharge: 2021-06-14 | Disposition: A | Discharge status: Home / Self Care | Payer: Medicare HMO | Source: Ambulatory Visit | Attending: Radiation Oncology | Admitting: Radiation Oncology

## 2021-06-14 DIAGNOSIS — C3411 Malignant neoplasm of upper lobe, right bronchus or lung: Secondary | ICD-10-CM | POA: Diagnosis not present

## 2021-06-14 DIAGNOSIS — C787 Secondary malignant neoplasm of liver and intrahepatic bile duct: Secondary | ICD-10-CM | POA: Diagnosis not present

## 2021-06-14 DIAGNOSIS — C7951 Secondary malignant neoplasm of bone: Secondary | ICD-10-CM | POA: Diagnosis not present

## 2021-06-14 DIAGNOSIS — Z510 Encounter for antineoplastic radiation therapy: Secondary | ICD-10-CM | POA: Diagnosis not present

## 2021-06-17 ENCOUNTER — Ambulatory Visit
Admission: RE | Admit: 2021-06-17 | Discharge: 2021-06-17 | Disposition: A | Discharge status: Home / Self Care | Payer: Medicare HMO | Source: Ambulatory Visit | Attending: Radiation Oncology | Admitting: Radiation Oncology

## 2021-06-17 ENCOUNTER — Telehealth: Payer: Self-pay | Admitting: *Deleted

## 2021-06-17 ENCOUNTER — Other Ambulatory Visit: Payer: Self-pay | Admitting: *Deleted

## 2021-06-17 DIAGNOSIS — C787 Secondary malignant neoplasm of liver and intrahepatic bile duct: Secondary | ICD-10-CM | POA: Diagnosis not present

## 2021-06-17 DIAGNOSIS — Z510 Encounter for antineoplastic radiation therapy: Secondary | ICD-10-CM | POA: Diagnosis not present

## 2021-06-17 DIAGNOSIS — Z20822 Contact with and (suspected) exposure to covid-19: Secondary | ICD-10-CM | POA: Diagnosis not present

## 2021-06-17 DIAGNOSIS — C7951 Secondary malignant neoplasm of bone: Secondary | ICD-10-CM | POA: Diagnosis not present

## 2021-06-17 DIAGNOSIS — Z03818 Encounter for observation for suspected exposure to other biological agents ruled out: Secondary | ICD-10-CM | POA: Diagnosis not present

## 2021-06-17 DIAGNOSIS — C3411 Malignant neoplasm of upper lobe, right bronchus or lung: Secondary | ICD-10-CM | POA: Diagnosis not present

## 2021-06-17 NOTE — Telephone Encounter (Signed)
Mr. Hoffmann has been running low grad temp for 2 days.  Tympanic check 100.6 today. Patient being sent for Covid testing per Dr. Baruch Gouty.  Dr. B. Also notified of above.   Patient told to call office if worsening fever or shortness of breath.

## 2021-06-18 ENCOUNTER — Ambulatory Visit: Payer: Medicare HMO

## 2021-06-19 ENCOUNTER — Ambulatory Visit: Payer: Medicare HMO

## 2021-06-24 ENCOUNTER — Ambulatory Visit
Admission: RE | Admit: 2021-06-24 | Discharge: 2021-06-24 | Disposition: A | Discharge status: Home / Self Care | Payer: Medicare HMO | Source: Ambulatory Visit | Attending: Radiation Oncology | Admitting: Radiation Oncology

## 2021-06-24 DIAGNOSIS — Z510 Encounter for antineoplastic radiation therapy: Secondary | ICD-10-CM | POA: Diagnosis not present

## 2021-06-24 DIAGNOSIS — C787 Secondary malignant neoplasm of liver and intrahepatic bile duct: Secondary | ICD-10-CM | POA: Diagnosis not present

## 2021-06-24 DIAGNOSIS — C3411 Malignant neoplasm of upper lobe, right bronchus or lung: Secondary | ICD-10-CM | POA: Diagnosis not present

## 2021-06-24 DIAGNOSIS — C7951 Secondary malignant neoplasm of bone: Secondary | ICD-10-CM | POA: Diagnosis not present

## 2021-06-25 ENCOUNTER — Ambulatory Visit
Admission: RE | Admit: 2021-06-25 | Discharge: 2021-06-25 | Disposition: A | Discharge status: Home / Self Care | Payer: Medicare HMO | Source: Ambulatory Visit | Attending: Radiation Oncology | Admitting: Radiation Oncology

## 2021-06-25 DIAGNOSIS — C787 Secondary malignant neoplasm of liver and intrahepatic bile duct: Secondary | ICD-10-CM | POA: Diagnosis not present

## 2021-06-25 DIAGNOSIS — C3411 Malignant neoplasm of upper lobe, right bronchus or lung: Secondary | ICD-10-CM | POA: Diagnosis not present

## 2021-06-25 DIAGNOSIS — C7951 Secondary malignant neoplasm of bone: Secondary | ICD-10-CM | POA: Diagnosis not present

## 2021-06-25 DIAGNOSIS — Z510 Encounter for antineoplastic radiation therapy: Secondary | ICD-10-CM | POA: Diagnosis not present

## 2021-06-26 ENCOUNTER — Ambulatory Visit
Admission: RE | Admit: 2021-06-26 | Discharge: 2021-06-26 | Disposition: A | Discharge status: Home / Self Care | Payer: Medicare HMO | Source: Ambulatory Visit | Attending: Radiation Oncology | Admitting: Radiation Oncology

## 2021-06-26 DIAGNOSIS — Z510 Encounter for antineoplastic radiation therapy: Secondary | ICD-10-CM | POA: Diagnosis not present

## 2021-06-26 DIAGNOSIS — C787 Secondary malignant neoplasm of liver and intrahepatic bile duct: Secondary | ICD-10-CM | POA: Diagnosis not present

## 2021-06-26 DIAGNOSIS — C7951 Secondary malignant neoplasm of bone: Secondary | ICD-10-CM | POA: Diagnosis not present

## 2021-06-26 DIAGNOSIS — C3411 Malignant neoplasm of upper lobe, right bronchus or lung: Secondary | ICD-10-CM | POA: Diagnosis not present

## 2021-06-27 ENCOUNTER — Ambulatory Visit
Admission: RE | Admit: 2021-06-27 | Discharge: 2021-06-27 | Disposition: A | Discharge status: Home / Self Care | Payer: Medicare HMO | Source: Ambulatory Visit | Attending: Radiation Oncology | Admitting: Radiation Oncology

## 2021-06-27 DIAGNOSIS — Z510 Encounter for antineoplastic radiation therapy: Secondary | ICD-10-CM | POA: Diagnosis not present

## 2021-06-27 DIAGNOSIS — C787 Secondary malignant neoplasm of liver and intrahepatic bile duct: Secondary | ICD-10-CM | POA: Diagnosis not present

## 2021-06-27 DIAGNOSIS — C3411 Malignant neoplasm of upper lobe, right bronchus or lung: Secondary | ICD-10-CM | POA: Diagnosis not present

## 2021-06-27 DIAGNOSIS — C7951 Secondary malignant neoplasm of bone: Secondary | ICD-10-CM | POA: Diagnosis not present

## 2021-06-28 ENCOUNTER — Ambulatory Visit
Admission: RE | Admit: 2021-06-28 | Discharge: 2021-06-28 | Disposition: A | Discharge status: Home / Self Care | Payer: Medicare HMO | Source: Ambulatory Visit | Attending: Radiation Oncology | Admitting: Radiation Oncology

## 2021-06-28 DIAGNOSIS — C787 Secondary malignant neoplasm of liver and intrahepatic bile duct: Secondary | ICD-10-CM | POA: Diagnosis not present

## 2021-06-28 DIAGNOSIS — Z510 Encounter for antineoplastic radiation therapy: Secondary | ICD-10-CM | POA: Diagnosis not present

## 2021-06-28 DIAGNOSIS — C7951 Secondary malignant neoplasm of bone: Secondary | ICD-10-CM | POA: Diagnosis not present

## 2021-06-28 DIAGNOSIS — C3411 Malignant neoplasm of upper lobe, right bronchus or lung: Secondary | ICD-10-CM | POA: Diagnosis not present

## 2021-07-01 ENCOUNTER — Ambulatory Visit
Admission: RE | Admit: 2021-07-01 | Discharge: 2021-07-01 | Disposition: A | Discharge status: Home / Self Care | Payer: Medicare HMO | Source: Ambulatory Visit | Attending: Radiation Oncology | Admitting: Radiation Oncology

## 2021-07-01 DIAGNOSIS — C7951 Secondary malignant neoplasm of bone: Secondary | ICD-10-CM | POA: Diagnosis not present

## 2021-07-01 DIAGNOSIS — Z510 Encounter for antineoplastic radiation therapy: Secondary | ICD-10-CM | POA: Diagnosis not present

## 2021-07-01 DIAGNOSIS — J9601 Acute respiratory failure with hypoxia: Secondary | ICD-10-CM | POA: Diagnosis not present

## 2021-07-01 DIAGNOSIS — C3411 Malignant neoplasm of upper lobe, right bronchus or lung: Secondary | ICD-10-CM | POA: Diagnosis not present

## 2021-07-01 DIAGNOSIS — J441 Chronic obstructive pulmonary disease with (acute) exacerbation: Secondary | ICD-10-CM | POA: Diagnosis not present

## 2021-07-01 DIAGNOSIS — C787 Secondary malignant neoplasm of liver and intrahepatic bile duct: Secondary | ICD-10-CM | POA: Diagnosis not present

## 2021-07-02 ENCOUNTER — Ambulatory Visit
Admission: RE | Admit: 2021-07-02 | Discharge: 2021-07-02 | Disposition: A | Discharge status: Home / Self Care | Payer: Medicare HMO | Source: Ambulatory Visit | Attending: Radiation Oncology | Admitting: Radiation Oncology

## 2021-07-02 DIAGNOSIS — C3411 Malignant neoplasm of upper lobe, right bronchus or lung: Secondary | ICD-10-CM | POA: Diagnosis not present

## 2021-07-02 DIAGNOSIS — C7951 Secondary malignant neoplasm of bone: Secondary | ICD-10-CM | POA: Diagnosis not present

## 2021-07-02 DIAGNOSIS — C787 Secondary malignant neoplasm of liver and intrahepatic bile duct: Secondary | ICD-10-CM | POA: Diagnosis not present

## 2021-07-02 DIAGNOSIS — Z510 Encounter for antineoplastic radiation therapy: Secondary | ICD-10-CM | POA: Diagnosis not present

## 2021-07-03 ENCOUNTER — Ambulatory Visit
Admission: RE | Admit: 2021-07-03 | Discharge: 2021-07-03 | Disposition: A | Discharge status: Home / Self Care | Payer: Medicare HMO | Source: Ambulatory Visit | Attending: Radiation Oncology | Admitting: Radiation Oncology

## 2021-07-03 DIAGNOSIS — C787 Secondary malignant neoplasm of liver and intrahepatic bile duct: Secondary | ICD-10-CM | POA: Diagnosis not present

## 2021-07-03 DIAGNOSIS — C7951 Secondary malignant neoplasm of bone: Secondary | ICD-10-CM | POA: Diagnosis not present

## 2021-07-03 DIAGNOSIS — Z510 Encounter for antineoplastic radiation therapy: Secondary | ICD-10-CM | POA: Diagnosis not present

## 2021-07-03 DIAGNOSIS — C3411 Malignant neoplasm of upper lobe, right bronchus or lung: Secondary | ICD-10-CM | POA: Diagnosis not present

## 2021-07-04 ENCOUNTER — Ambulatory Visit
Admission: RE | Admit: 2021-07-04 | Discharge: 2021-07-04 | Disposition: A | Discharge status: Home / Self Care | Payer: Medicare HMO | Source: Ambulatory Visit | Attending: Radiation Oncology | Admitting: Radiation Oncology

## 2021-07-04 DIAGNOSIS — Z510 Encounter for antineoplastic radiation therapy: Secondary | ICD-10-CM | POA: Diagnosis not present

## 2021-07-04 DIAGNOSIS — C787 Secondary malignant neoplasm of liver and intrahepatic bile duct: Secondary | ICD-10-CM | POA: Diagnosis not present

## 2021-07-04 DIAGNOSIS — C3411 Malignant neoplasm of upper lobe, right bronchus or lung: Secondary | ICD-10-CM | POA: Diagnosis not present

## 2021-07-04 DIAGNOSIS — C7951 Secondary malignant neoplasm of bone: Secondary | ICD-10-CM | POA: Diagnosis not present

## 2021-07-05 ENCOUNTER — Ambulatory Visit
Admission: RE | Admit: 2021-07-05 | Discharge: 2021-07-05 | Disposition: A | Discharge status: Home / Self Care | Payer: Medicare HMO | Source: Ambulatory Visit | Attending: Radiation Oncology | Admitting: Radiation Oncology

## 2021-07-05 DIAGNOSIS — Z510 Encounter for antineoplastic radiation therapy: Secondary | ICD-10-CM | POA: Diagnosis not present

## 2021-07-05 DIAGNOSIS — C3411 Malignant neoplasm of upper lobe, right bronchus or lung: Secondary | ICD-10-CM | POA: Diagnosis not present

## 2021-07-05 DIAGNOSIS — C787 Secondary malignant neoplasm of liver and intrahepatic bile duct: Secondary | ICD-10-CM | POA: Diagnosis not present

## 2021-07-05 DIAGNOSIS — C7951 Secondary malignant neoplasm of bone: Secondary | ICD-10-CM | POA: Diagnosis not present

## 2021-07-08 ENCOUNTER — Ambulatory Visit
Admission: RE | Admit: 2021-07-08 | Discharge: 2021-07-08 | Disposition: A | Discharge status: Home / Self Care | Payer: Medicare HMO | Source: Ambulatory Visit | Attending: Radiation Oncology | Admitting: Radiation Oncology

## 2021-07-08 DIAGNOSIS — C7951 Secondary malignant neoplasm of bone: Secondary | ICD-10-CM | POA: Diagnosis not present

## 2021-07-08 DIAGNOSIS — C3411 Malignant neoplasm of upper lobe, right bronchus or lung: Secondary | ICD-10-CM | POA: Diagnosis not present

## 2021-07-08 DIAGNOSIS — Z510 Encounter for antineoplastic radiation therapy: Secondary | ICD-10-CM | POA: Diagnosis not present

## 2021-07-08 DIAGNOSIS — C787 Secondary malignant neoplasm of liver and intrahepatic bile duct: Secondary | ICD-10-CM | POA: Diagnosis not present

## 2021-07-09 ENCOUNTER — Ambulatory Visit
Admission: RE | Admit: 2021-07-09 | Discharge: 2021-07-09 | Disposition: A | Discharge status: Home / Self Care | Payer: Medicare HMO | Source: Ambulatory Visit | Attending: Radiation Oncology | Admitting: Radiation Oncology

## 2021-07-09 ENCOUNTER — Ambulatory Visit: Payer: Medicare HMO

## 2021-07-09 DIAGNOSIS — C7951 Secondary malignant neoplasm of bone: Secondary | ICD-10-CM | POA: Diagnosis not present

## 2021-07-09 DIAGNOSIS — Z510 Encounter for antineoplastic radiation therapy: Secondary | ICD-10-CM | POA: Diagnosis not present

## 2021-07-09 DIAGNOSIS — C787 Secondary malignant neoplasm of liver and intrahepatic bile duct: Secondary | ICD-10-CM | POA: Diagnosis not present

## 2021-07-09 DIAGNOSIS — C3411 Malignant neoplasm of upper lobe, right bronchus or lung: Secondary | ICD-10-CM | POA: Diagnosis not present

## 2021-07-10 ENCOUNTER — Ambulatory Visit
Admission: RE | Admit: 2021-07-10 | Discharge: 2021-07-10 | Disposition: A | Discharge status: Home / Self Care | Payer: Medicare HMO | Source: Ambulatory Visit | Attending: Radiation Oncology | Admitting: Radiation Oncology

## 2021-07-10 DIAGNOSIS — Z510 Encounter for antineoplastic radiation therapy: Secondary | ICD-10-CM | POA: Diagnosis not present

## 2021-07-10 DIAGNOSIS — C787 Secondary malignant neoplasm of liver and intrahepatic bile duct: Secondary | ICD-10-CM | POA: Diagnosis not present

## 2021-07-10 DIAGNOSIS — C3411 Malignant neoplasm of upper lobe, right bronchus or lung: Secondary | ICD-10-CM | POA: Diagnosis not present

## 2021-07-10 DIAGNOSIS — C7951 Secondary malignant neoplasm of bone: Secondary | ICD-10-CM | POA: Diagnosis not present

## 2021-07-11 ENCOUNTER — Ambulatory Visit
Admission: RE | Admit: 2021-07-11 | Discharge: 2021-07-11 | Disposition: A | Discharge status: Home / Self Care | Payer: Medicare HMO | Source: Ambulatory Visit | Attending: Radiation Oncology | Admitting: Radiation Oncology

## 2021-07-11 DIAGNOSIS — Z510 Encounter for antineoplastic radiation therapy: Secondary | ICD-10-CM | POA: Diagnosis not present

## 2021-07-11 DIAGNOSIS — C787 Secondary malignant neoplasm of liver and intrahepatic bile duct: Secondary | ICD-10-CM | POA: Diagnosis not present

## 2021-07-11 DIAGNOSIS — C7951 Secondary malignant neoplasm of bone: Secondary | ICD-10-CM | POA: Diagnosis not present

## 2021-07-11 DIAGNOSIS — C3411 Malignant neoplasm of upper lobe, right bronchus or lung: Secondary | ICD-10-CM | POA: Diagnosis not present

## 2021-07-14 ENCOUNTER — Other Ambulatory Visit: Payer: Self-pay

## 2021-07-31 ENCOUNTER — Other Ambulatory Visit: Payer: Self-pay

## 2021-07-31 ENCOUNTER — Encounter: Payer: Self-pay | Admitting: Internal Medicine

## 2021-07-31 ENCOUNTER — Inpatient Hospital Stay: Payer: Medicare HMO

## 2021-07-31 ENCOUNTER — Inpatient Hospital Stay: Payer: Medicare HMO | Admitting: Internal Medicine

## 2021-07-31 ENCOUNTER — Inpatient Hospital Stay: Payer: Medicare HMO | Attending: Internal Medicine

## 2021-07-31 DIAGNOSIS — J449 Chronic obstructive pulmonary disease, unspecified: Secondary | ICD-10-CM | POA: Diagnosis not present

## 2021-07-31 DIAGNOSIS — C7951 Secondary malignant neoplasm of bone: Secondary | ICD-10-CM | POA: Insufficient documentation

## 2021-07-31 DIAGNOSIS — Z923 Personal history of irradiation: Secondary | ICD-10-CM | POA: Insufficient documentation

## 2021-07-31 DIAGNOSIS — R0602 Shortness of breath: Secondary | ICD-10-CM | POA: Insufficient documentation

## 2021-07-31 DIAGNOSIS — Z5112 Encounter for antineoplastic immunotherapy: Secondary | ICD-10-CM | POA: Diagnosis present

## 2021-07-31 DIAGNOSIS — R5382 Chronic fatigue, unspecified: Secondary | ICD-10-CM | POA: Diagnosis not present

## 2021-07-31 DIAGNOSIS — C3411 Malignant neoplasm of upper lobe, right bronchus or lung: Secondary | ICD-10-CM

## 2021-07-31 DIAGNOSIS — E039 Hypothyroidism, unspecified: Secondary | ICD-10-CM | POA: Diagnosis not present

## 2021-07-31 DIAGNOSIS — Z87891 Personal history of nicotine dependence: Secondary | ICD-10-CM | POA: Diagnosis not present

## 2021-07-31 DIAGNOSIS — Z7189 Other specified counseling: Secondary | ICD-10-CM

## 2021-07-31 DIAGNOSIS — C7989 Secondary malignant neoplasm of other specified sites: Secondary | ICD-10-CM | POA: Insufficient documentation

## 2021-07-31 DIAGNOSIS — Z79890 Hormone replacement therapy (postmenopausal): Secondary | ICD-10-CM | POA: Diagnosis not present

## 2021-07-31 DIAGNOSIS — C787 Secondary malignant neoplasm of liver and intrahepatic bile duct: Secondary | ICD-10-CM | POA: Insufficient documentation

## 2021-07-31 DIAGNOSIS — M549 Dorsalgia, unspecified: Secondary | ICD-10-CM | POA: Diagnosis not present

## 2021-07-31 DIAGNOSIS — N400 Enlarged prostate without lower urinary tract symptoms: Secondary | ICD-10-CM | POA: Diagnosis not present

## 2021-07-31 DIAGNOSIS — Z801 Family history of malignant neoplasm of trachea, bronchus and lung: Secondary | ICD-10-CM | POA: Diagnosis not present

## 2021-07-31 DIAGNOSIS — Z79899 Other long term (current) drug therapy: Secondary | ICD-10-CM | POA: Diagnosis not present

## 2021-07-31 DIAGNOSIS — Z8249 Family history of ischemic heart disease and other diseases of the circulatory system: Secondary | ICD-10-CM | POA: Diagnosis not present

## 2021-07-31 DIAGNOSIS — M25512 Pain in left shoulder: Secondary | ICD-10-CM | POA: Diagnosis not present

## 2021-07-31 LAB — CBC WITH DIFFERENTIAL/PLATELET
Abs Immature Granulocytes: 0.01 10*3/uL (ref 0.00–0.07)
Basophils Absolute: 0 10*3/uL (ref 0.0–0.1)
Basophils Relative: 1 %
Eosinophils Absolute: 0.5 10*3/uL (ref 0.0–0.5)
Eosinophils Relative: 9 %
HCT: 43.4 % (ref 39.0–52.0)
Hemoglobin: 13.7 g/dL (ref 13.0–17.0)
Immature Granulocytes: 0 %
Lymphocytes Relative: 13 %
Lymphs Abs: 0.6 10*3/uL — ABNORMAL LOW (ref 0.7–4.0)
MCH: 28.9 pg (ref 26.0–34.0)
MCHC: 31.6 g/dL (ref 30.0–36.0)
MCV: 91.6 fL (ref 80.0–100.0)
Monocytes Absolute: 0.6 10*3/uL (ref 0.1–1.0)
Monocytes Relative: 12 %
Neutro Abs: 3.1 10*3/uL (ref 1.7–7.7)
Neutrophils Relative %: 65 %
Platelets: 183 10*3/uL (ref 150–400)
RBC: 4.74 MIL/uL (ref 4.22–5.81)
RDW: 14 % (ref 11.5–15.5)
WBC: 4.9 10*3/uL (ref 4.0–10.5)
nRBC: 0 % (ref 0.0–0.2)

## 2021-07-31 LAB — COMPREHENSIVE METABOLIC PANEL
ALT: 12 U/L (ref 0–44)
AST: 20 U/L (ref 15–41)
Albumin: 3.6 g/dL (ref 3.5–5.0)
Alkaline Phosphatase: 74 U/L (ref 38–126)
Anion gap: 6 (ref 5–15)
BUN: 11 mg/dL (ref 8–23)
CO2: 34 mmol/L — ABNORMAL HIGH (ref 22–32)
Calcium: 8.8 mg/dL — ABNORMAL LOW (ref 8.9–10.3)
Chloride: 93 mmol/L — ABNORMAL LOW (ref 98–111)
Creatinine, Ser: 0.47 mg/dL — ABNORMAL LOW (ref 0.61–1.24)
GFR, Estimated: >60 mL/min (ref >60)
Glucose, Bld: 107 mg/dL — ABNORMAL HIGH (ref 70–99)
Potassium: 3.9 mmol/L (ref 3.5–5.1)
Sodium: 133 mmol/L — ABNORMAL LOW (ref 135–145)
Total Bilirubin: 0.4 mg/dL (ref 0.3–1.2)
Total Protein: 7.7 g/dL (ref 6.5–8.1)

## 2021-07-31 MED ORDER — HEPARIN SOD (PORK) LOCK FLUSH 100 UNIT/ML IV SOLN
500.0000 [IU] | Freq: Once | INTRAVENOUS | Status: AC | PRN
  Administered 2021-07-31: 500 [IU]
  Filled 2021-07-31: qty 5

## 2021-07-31 MED ORDER — SODIUM CHLORIDE 0.9 % IV SOLN
Freq: Once | INTRAVENOUS | Status: AC
  Filled 2021-07-31: qty 250

## 2021-07-31 MED ORDER — SODIUM CHLORIDE 0.9 % IV SOLN
200.0000 mg | Freq: Once | INTRAVENOUS | Status: AC
  Administered 2021-07-31: 200 mg via INTRAVENOUS
  Filled 2021-07-31: qty 8

## 2021-07-31 MED ORDER — SODIUM CHLORIDE 0.9% FLUSH
10.0000 mL | INTRAVENOUS | Status: DC | PRN
  Filled 2021-07-31: qty 10

## 2021-07-31 MED ORDER — HEPARIN SOD (PORK) LOCK FLUSH 100 UNIT/ML IV SOLN
INTRAVENOUS | Status: AC
  Filled 2021-07-31: qty 5

## 2021-07-31 NOTE — Progress Notes (Signed)
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   # 2021- MAY 26th-Keytrda x2 cycles; [borderline PS] July 7th-carbo Alimta Keytruda cycle #1  #November 2022-oligometastatic progression-right upper lobe subpleural involvement with; s/p radiation finished December 15.  Temporarily held Startex.  #Jan fourth 2023-restarted Keytruda.  # NGS/MOLECULAR TESTS:   # PALLIATIVE CARE EVALUATION:  # PAIN MANAGEMENT:    DIAGNOSIS:   STAGE:         ;  GOALS:  CURRENT/MOST RECENT THERAPY :     Cancer of upper lobe of right lung ([place])  11/23/2019 Initial Diagnosis   Cancer of upper lobe of right lung ([place])   12/21/2019 -  Chemotherapy   Patient is on Treatment Plan : LUNG CARBOplatin / Pemetrexed / Pembrolizumab q21d Induction x 4 cycles / Maintenance Pemetrexed + Pembrolizumab      HISTORY OF PRESENTING ILLNESS: Thin built frail appearing male patient.  No acute distress.  Alone.   Nasal cannula oxygen.  Cody [name] 66 y.o.  male patient with metastatic non-small cell lung cancer; cord compression status post decompressive surgery; currently on Keytruda maintenance-is here for follow-up.  Patient's [name] is on hold for the last 6 weeks or so-as patient recently finished radiation to  the RUL oligometastatic progression lung/rib lesion.  Patient finished radiation December 15.  Denies any worsening shortness of breath or the cough.Patient denies any worsening pain.  Chronic mild fatigue.  No diarrhea. No nausea no vomiting no diarrhea.   Review of Systems  Constitutional:  Positive for malaise/fatigue. Negative for chills, diaphoresis and fever.  HENT:  Negative for nosebleeds and sore throat.   Eyes:  Negative for double vision.  Respiratory:  Positive for shortness of breath. Negative for cough, hemoptysis, sputum production and wheezing.   Cardiovascular:  Negative for chest pain, palpitations, orthopnea and leg swelling.  Gastrointestinal:  Negative for abdominal pain, blood in stool, constipation, heartburn, melena, nausea and vomiting.  Genitourinary:  Negative for dysuria, frequency and urgency.  Musculoskeletal:  Positive for back pain and joint pain.  Skin: Negative.  Negative for itching and rash.  Neurological:  Negative for dizziness, tingling, focal weakness, weakness and headaches.  Endo/Heme/Allergies:  Does not bruise/bleed easily.  Psychiatric/Behavioral:  Negative for depression. The patient is not nervous/anxious and does not have insomnia.     MEDICAL HISTORY:  Past Medical History:  Diagnosis Date   Allergy    Cancer of upper lobe of right lung ([place]) 10/2019   COPD (chronic obstructive pulmonary disease) (HCC)    Prostate enlargement     SURGICAL HISTORY: Past Surgical History:  Procedure Laterality Date   HERNIA REPAIR Bilateral 1982   HERNIA REPAIR Right 1994   IR IMAGING GUIDED PORT INSERTION  02/24/2020   LAMINECTOMY FOR EXCISION / EVACUATION INTRASPINAL LESION  11/07/2019   T1-T 6 at [facility]     SOCIAL HISTORY: Social History  Socioeconomic History   Marital status: Married    Spouse name: [name]    Number of children: 4   Years of education: Not on file   Highest education level: Some college, no degree  Occupational History    Occupation: dispatch and delivery   Tobacco Use   Smoking status: Former    Packs/day: 2.00    Years: 30.00    Pack years: 60.00    Types: Cigarettes    Quit date: 07/29/2011    Years since quitting: 10.0   Smokeless tobacco: Never   Tobacco comments:    pt vapes  Vaping Use   Vaping Use: Not on file  Substance and Sexual Activity   Alcohol use: Yes    Alcohol/week: 5.0 standard drinks    Types: 5 Standard drinks or equivalent per week    Comment: 1-2/day   Drug use: No   Sexual activity: Not Currently    Partners: Female  Other Topics Concern   Not on file  Social History Narrative   Worked in warehouse/ quit smoking in 2015; beer 1/day; in [place]; with wife.    Social Determinants of Health   Financial Resource Strain: Not on file  Food Insecurity: Not on file  Transportation Needs: Not on file  Physical Activity: Not on file  Stress: Not on file  Social Connections: Not on file  Intimate Partner Violence: Not on file    FAMILY HISTORY: Family History  Problem Relation Age of Onset   Heart disease Father    CVA Mother    Lung cancer Sister    Lung cancer Brother     ALLERGIES:  has No Known Allergies.  MEDICATIONS:  Current Outpatient Medications  Medication Sig Dispense Refill   acetaminophen (TYLENOL) 500 MG tablet Take 500-1,000 mg by mouth every 6 (six) hours as needed for mild pain or fever.      albuterol (VENTOLIN HFA) 108 (90 Base) MCG/ACT inhaler Inhale 2 puffs into the lungs every 4 (four) hours as needed for wheezing or shortness of breath. 18 g 0   Calcium Carb-Cholecalciferol (OYSTER SHELL CALCIUM) 500-400 MG-UNIT TABS Take 500 tablets by mouth.     escitalopram (LEXAPRO) 5 MG tablet Take 1 tablet (5 mg total) by mouth daily. am's 90 tablet 1   Fluticasone-Umeclidin-Vilant (TRELEGY ELLIPTA) 100-62.5-25 MCG/INH AEPB Inhale 1 puff into the lungs daily. 180 each 1   levothyroxine (SYNTHROID) 25 MCG tablet Take 1 tablet (25 mcg total) by mouth  daily before breakfast. 30 tablet 3   lidocaine-prilocaine (EMLA) cream Apply 1 application topically as needed. Apply small amount to port site at least 1 hour prior to it being accessed, cover with plastic wrap 30 g 1   Multiple Vitamins-Minerals (MULTIVITAMIN GUMMIES MENS PO) Take 1 Dose by mouth daily.      ondansetron (ZOFRAN) 4 MG tablet Take 1 tablet (4 mg total) by mouth every 8 (eight) hours as needed for nausea or vomiting. 20 tablet 2   prochlorperazine (COMPAZINE) 10 MG tablet Take 1 tablet (10 mg total) by mouth every 6 (six) hours as needed for nausea or vomiting. 40 tablet 1   QUEtiapine (SEROQUEL) 25 MG tablet Take 1 tablet (25 mg total) by mouth at bedtime. 90 tablet 1   No current facility-administered medications for this visit.   Facility-Administered Medications Ordered in Other Visits  Medication Dose Route Frequency Provider Last Rate Last Admin   0.9 %  sodium chloride infusion   Intravenous Once Celoron, Elisha Headland,  MD       heparin lock flush 100 unit/mL  500 Units Intracatheter Once PRN Cammie Sickle, MD       pembrolizumab [facility]) 200 mg in sodium chloride 0.9 % 50 mL chemo infusion  200 mg Intravenous Once Charlaine Dalton R, MD       sodium chloride flush (NS) 0.9 % injection 10 mL  10 mL Intracatheter PRN Cammie Sickle, MD          .  PHYSICAL EXAMINATION: ECOG PERFORMANCE STATUS: 3 - Symptomatic, >50% confined to bed  Vitals:   07/31/21 0838  BP: 124/73  Pulse: 75  Temp: (!) 96.7 F (35.9 C)  SpO2: 100%   Filed Weights   07/31/21 0838  Weight: 125 lb 3.2 oz (56.8 kg)    Physical Exam HENT:     Head: Normocephalic and atraumatic.     Mouth/Throat:     Pharynx: No oropharyngeal exudate.  Eyes:     Pupils: Pupils are equal, round, and reactive to light.  Cardiovascular:     Rate and Rhythm: Normal rate and regular rhythm.  Pulmonary:     Effort: No respiratory distress.     Breath sounds: No wheezing.     Comments:  Decreased air entry bilaterally. Abdominal:     General: Bowel sounds are normal. There is no distension.     Palpations: Abdomen is soft. There is no mass.     Tenderness: There is no abdominal tenderness. There is no guarding or rebound.  Musculoskeletal:        General: No tenderness. Normal range of motion.     Cervical back: Normal range of motion and neck supple.  Skin:    General: Skin is warm.  Neurological:     Mental Status: He is alert and oriented to person, place, and time.  Psychiatric:        Mood and Affect: Affect normal.     LABORATORY DATA:  I have reviewed the data as listed Lab Results  Component Value Date   WBC 4.9 07/31/2021   HGB 13.7 07/31/2021   HCT 43.4 07/31/2021   MCV 91.6 07/31/2021   PLT 183 07/31/2021   Recent Labs    05/20/21 0900 06/10/21 0844 07/31/21 0809  NA 134* 135 133*  K 4.2 4.3 3.9  CL 96* 94* 93*  CO2 33* 33* 34*  GLUCOSE 107* 102* 107*  BUN _0 CREATININE 0.49* 0.54* 0.47*  CALCIUM 8.7* 8.7* 8.8*  GFRNONAA >60 >60 >60  PROT 7.6 8.2* 7.7  ALBUMIN 3.5 3.7 3.6  AST _1 ALT _2 ALKPHOS 64 76 74  BILITOT 0.8 0.3 0.4    RADIOGRAPHIC STUDIES: I have personally reviewed the radiological images as listed and agreed with the findings in the report. No results found.   ASSESSMENT & PLAN:   Cancer of upper lobe of right lung ([place]) # Stage IV metastatic non-small cell lung cancer favor adeno; mets to bone; liver.  Currently on Keytruda maintenance- . NOV 2dn, 2022-  Unchanged treated liver metastases; shows subpleural soft tissue metastases posterior aspect of the right upper lobe with involvement of the adjacent ribs.  Given the oligometastatic progression-s/p RT [until dec 15th, 2022].   # proceed with [place] maintainance today. Labs today reviewed;  acceptable for treatment today. Will repeat CT imaging in mid-end FEB, 2023.   # Bone metastases- on zometa 3 mg IVPB.  Every 6 weeks. Ca-8.5.Clinically-  STABLE> HOLD zomeat today  # left shoulder pain- MSK- recommend NSAIDs/Topical. .Clinically-STABLE.  #Debility-spinal cord compression/status post surgery-s/p physical therapy-. Clinically- STABLE.   # Hypothyroidism-  Mid sep started on Synthroid low-dose 25 mcg once a day in the morning- OCT 2022-TSH 6.  Normal T3-T4.  Await labs today.   # COPD-.Clinically- STABLE. On 2 lit O2; Trelegy.  # DISPOSITION:  #  Keytruda; HOLD Zometa today;  # follow up in 3 weeks- labs- cbc/cmp; Keytruda;-  ZOMETA; -Dr.B    .    All questions were answered. The patient knows to call the clinic with any problems, questions or concerns.    Cammie Sickle, MD 07/31/2021 9:09 AM

## 2021-07-31 NOTE — Patient Instructions (Signed)
[facility] [place]  Discharge Instructions: Thank you for choosing [facility] to provide your oncology and hematology care.  If you have a lab appointment with the [facility], please go directly to the [facility] and check in at the registration area.  Wear comfortable clothing and clothing appropriate for easy access to any Portacath or PICC line.   We strive to give you quality time with your provider. You may need to reschedule your appointment if you arrive late (15 or more minutes).  Arriving late affects you and other patients whose appointments are after yours.  Also, if you miss three or more appointments without notifying the office, you may be dismissed from the clinic at the providers discretion.      For prescription refill requests, have your pharmacy contact our office and allow 72 hours for refills to be completed.    Today you received the following chemotherapy and/or immunotherapy agents - pembrolizumab      To help prevent nausea and vomiting after your treatment, we encourage you to take your nausea medication as directed.  BELOW ARE SYMPTOMS THAT SHOULD BE REPORTED IMMEDIATELY: *FEVER GREATER THAN 100.4 F (38 C) OR HIGHER *CHILLS OR SWEATING *NAUSEA AND VOMITING THAT IS NOT CONTROLLED WITH YOUR NAUSEA MEDICATION *UNUSUAL SHORTNESS OF BREATH *UNUSUAL BRUISING OR BLEEDING *URINARY PROBLEMS (pain or burning when urinating, or frequent urination) *BOWEL PROBLEMS (unusual diarrhea, constipation, pain near the anus) TENDERNESS IN MOUTH AND THROAT WITH OR WITHOUT PRESENCE OF ULCERS (sore throat, sores in mouth, or a toothache) UNUSUAL RASH, SWELLING OR PAIN  UNUSUAL VAGINAL DISCHARGE OR ITCHING   Items with * indicate a potential emergency and should be followed up as soon as possible or go to the Emergency Department if any problems should occur.  Please show the CHEMOTHERAPY ALERT CARD or IMMUNOTHERAPY ALERT CARD at  check-in to the Emergency Department and triage nurse.  Should you have questions after your visit or need to cancel or reschedule your appointment, please contact [facility]  [phone] and follow the prompts.  Office hours are 8:00 a.m. to 4:30 p.m. Monday - Friday. Please note that voicemails left after 4:00 p.m. may not be returned until the following business day.  We are closed weekends and major holidays. You have access to a nurse at all times for urgent questions. Please call the main number to the clinic [phone] and follow the prompts.  For any non-urgent questions, you may also contact your provider using MyChart. We now offer e-Visits for anyone 59 and older to request care online for non-urgent symptoms. For details visit [email or web address].   Also download the MyChart app! Go to the app store, search "MyChart", open the app, select [facility], and log in with your MyChart username and password.  Due to Covid, a mask is required upon entering the hospital/clinic. If you do not have a mask, one will be given to you upon arrival. For doctor visits, patients may have 1 support person aged 77 or older with them. For treatment visits, patients cannot have anyone with them due to current Covid guidelines and our immunocompromised population.   Pembrolizumab injection What is this medication? PEMBROLIZUMAB (pem broe liz ue mab) is a monoclonal antibody. It is used to treat certain types of cancer. This medicine may be used for other purposes; ask your health care provider or pharmacist if you have questions. COMMON BRAND NAME(S): Keytruda What should I tell my care  team before I take this medication? They need to know if you have any of these conditions: autoimmune diseases like Crohn's disease, ulcerative colitis, or lupus have had or planning to have an allogeneic stem cell transplant (uses someone else's stem cells) history of organ  transplant history of chest radiation nervous system problems like myasthenia gravis or Guillain-Barre syndrome an unusual or allergic reaction to pembrolizumab, other medicines, foods, dyes, or preservatives pregnant or trying to get pregnant breast-feeding How should I use this medication? This medicine is for infusion into a vein. It is given by a health care professional in a hospital or clinic setting. A special MedGuide will be given to you before each treatment. Be sure to read this information carefully each time. Talk to your pediatrician regarding the use of this medicine in children. While this drug may be prescribed for children as young as 6 months for selected conditions, precautions do apply. Overdosage: If you think you have taken too much of this medicine contact a poison control center or emergency room at once. NOTE: This medicine is only for you. Do not share this medicine with others. What if I miss a dose? It is important not to miss your dose. Call your doctor or health care professional if you are unable to keep an appointment. What may interact with this medication? Interactions have not been studied. This list may not describe all possible interactions. Give your health care provider a list of all the medicines, herbs, non-prescription drugs, or dietary supplements you use. Also tell them if you smoke, drink alcohol, or use illegal drugs. Some items may interact with your medicine. What should I watch for while using this medication? Your condition will be monitored carefully while you are receiving this medicine. You may need blood work done while you are taking this medicine. Do not become pregnant while taking this medicine or for 4 months after stopping it. Women should inform their doctor if they wish to become pregnant or think they might be pregnant. There is a potential for serious side effects to an unborn child. Talk to your health care professional or  pharmacist for more information. Do not breast-feed an infant while taking this medicine or for 4 months after the last dose. What side effects may I notice from receiving this medication? Side effects that you should report to your doctor or health care professional as soon as possible: allergic reactions like skin rash, itching or hives, swelling of the face, lips, or tongue bloody or black, tarry breathing problems changes in vision chest pain chills confusion constipation cough diarrhea dizziness or feeling faint or lightheaded fast or irregular heartbeat fever flushing joint pain low blood counts - this medicine may decrease the number of white blood cells, red blood cells and platelets. You may be at increased risk for infections and bleeding. muscle pain muscle weakness pain, tingling, numbness in the hands or feet persistent headache redness, blistering, peeling or loosening of the skin, including inside the mouth signs and symptoms of high blood sugar such as dizziness; dry mouth; dry skin; fruity breath; nausea; stomach pain; increased hunger or thirst; increased urination signs and symptoms of kidney injury like trouble passing urine or change in the amount of urine signs and symptoms of liver injury like dark urine, light-colored stools, loss of appetite, nausea, right upper belly pain, yellowing of the eyes or skin sweating swollen lymph nodes weight loss Side effects that usually do not require medical attention (report to your doctor  or health care professional if they continue or are bothersome): decreased appetite hair loss tiredness This list may not describe all possible side effects. Call your doctor for medical advice about side effects. You may report side effects to FDA at 1-800-FDA-1088. Where should I keep my medication? This drug is given in a hospital or clinic and will not be stored at home. NOTE: This sheet is a summary. It may not cover all possible  information. If you have questions about this medicine, talk to your doctor, pharmacist, or health care provider.  2022 Elsevier/Gold Standard (2021-04-02 00:00:00)

## 2021-07-31 NOTE — Assessment & Plan Note (Signed)
#   Stage IV metastatic non-small cell lung cancer favor adeno; mets to bone; liver.  Currently on Keytruda maintenance- . NOV 2dn, 2022-  Unchanged treated liver metastases; shows subpleural soft tissue metastases posterior aspect of the right upper lobe with involvement of the adjacent ribs.  Given the oligometastatic progression-s/p RT [until dec 15th, 2022].   # proceed with [place] maintainance today. Labs today reviewed;  acceptable for treatment today. Will repeat CT imaging in mid-end FEB, 2023.   # Bone metastases- on zometa 3 mg IVPB.  Every 6 weeks. Ca-8.5.Clinically- STABLE> HOLD zomeat today  # left shoulder pain- MSK- recommend NSAIDs/Topical. .Clinically-STABLE.  #Debility-spinal cord compression/status post surgery-s/p physical therapy-. Clinically- STABLE.   # Hypothyroidism-  Mid sep started on Synthroid low-dose 25 mcg once a day in the morning- OCT 2022-TSH 6.  Normal T3-T4.  Await labs today.   # COPD-.Clinically- STABLE. On 2 lit O2; Trelegy.  # DISPOSITION:  #  Keytruda; HOLD Zometa today;  # follow up in 3 weeks- labs- cbc/cmp; Keytruda;-  ZOMETA; -Dr.B    .

## 2021-07-31 NOTE — Progress Notes (Signed)
Ca 8.8. Keytruda today, hold Zometa per Dr. Rogue Bussing.

## 2021-08-01 DIAGNOSIS — J441 Chronic obstructive pulmonary disease with (acute) exacerbation: Secondary | ICD-10-CM | POA: Diagnosis not present

## 2021-08-01 DIAGNOSIS — J9601 Acute respiratory failure with hypoxia: Secondary | ICD-10-CM | POA: Diagnosis not present

## 2021-08-01 LAB — THYROID PANEL WITH TSH
Free Thyroxine Index: 2 (ref 1.2–4.9)
T3 Uptake Ratio: 29 % (ref 24–39)
T4, Total: 6.9 ug/dL (ref 4.5–12.0)
TSH: 5.51 u[IU]/mL — ABNORMAL HIGH (ref 0.450–4.500)

## 2021-08-08 ENCOUNTER — Telehealth: Payer: Self-pay | Admitting: *Deleted

## 2021-08-08 MED ORDER — LEVOTHYROXINE SODIUM 25 MCG PO TABS: 25.0000 ug | ORAL_TABLET | Freq: Every day | ORAL | 1 refills | Status: DC

## 2021-08-08 NOTE — Telephone Encounter (Signed)
Patient called asking if he needs to continue Levothyroxine or not, if so, he needs a refill sent to pharmacy Per last office note 1/4, "# Hypothyroidism-  Mid sep started on Synthroid low-dose 25 mcg once a day in the morning- OCT 2022-TSH 6.  Normal T3-T4.  Await labs today." Thyroid Panel With TSH Order: [id number] Status: Final result    Visible to patient: Yes (seen)    Next appt: 08/21/2021 at 08:30 AM in Oncology ([facility])    Dx: Cancer of upper lobe of right lung ([place])    0 Result Notes Component Ref Range & Units 8 d ago  (07/31/21) 2 mo ago  (05/20/21) 5 mo ago  (02/25/21) 8 mo ago  (12/03/20) 8 mo ago  (12/03/20) 8 mo ago  (11/12/20) 9 mo ago  (10/22/20)  TSH 0.450 - 4.500 uIU/mL 5.510 High   6.150 High   9.320 High   5.690 High   6.390 High  R, CM  6.830 High  R, CM  7.381 High  R, CM   T4, Total 4.5 - 12.0 ug/dL 6.9  6.0  6.5  7.6      T3 Uptake Ratio 24 - 39 % 29  28  28  30       Free Thyroxine Index 1.2 - 4.9 2.0  1.7 CM  1.8 CM  2.3 CM      Comment: (NOTE)  Performed At: [facility]  [address] [place], [place] [id number]  Rush Farmer MD IW:[id number]   Resulting Agency  [facility]         Specimen Collected: 07/31/21 08:09 Last Resulted: 08/01/21 06:37

## 2021-08-08 NOTE — Telephone Encounter (Signed)
Call returned to patient and informed per Dr B to continue Levothyroxine. I conformed pharmacy as [facility] and prescription has been sent

## 2021-08-21 ENCOUNTER — Other Ambulatory Visit: Payer: Self-pay

## 2021-08-21 ENCOUNTER — Inpatient Hospital Stay: Payer: Medicare HMO | Admitting: Internal Medicine

## 2021-08-21 ENCOUNTER — Inpatient Hospital Stay: Payer: Medicare HMO

## 2021-08-21 ENCOUNTER — Encounter: Payer: Self-pay | Admitting: Internal Medicine

## 2021-08-21 VITALS — BP 143/79 | HR 81 | Temp 96.0°F | Ht 69.0 in | Wt 126.2 lb

## 2021-08-21 VITALS — Resp 20

## 2021-08-21 DIAGNOSIS — C787 Secondary malignant neoplasm of liver and intrahepatic bile duct: Secondary | ICD-10-CM | POA: Diagnosis not present

## 2021-08-21 DIAGNOSIS — R0602 Shortness of breath: Secondary | ICD-10-CM | POA: Diagnosis not present

## 2021-08-21 DIAGNOSIS — C7989 Secondary malignant neoplasm of other specified sites: Secondary | ICD-10-CM | POA: Diagnosis not present

## 2021-08-21 DIAGNOSIS — C7951 Secondary malignant neoplasm of bone: Secondary | ICD-10-CM

## 2021-08-21 DIAGNOSIS — C3411 Malignant neoplasm of upper lobe, right bronchus or lung: Secondary | ICD-10-CM | POA: Diagnosis not present

## 2021-08-21 DIAGNOSIS — R5382 Chronic fatigue, unspecified: Secondary | ICD-10-CM | POA: Diagnosis not present

## 2021-08-21 DIAGNOSIS — N400 Enlarged prostate without lower urinary tract symptoms: Secondary | ICD-10-CM | POA: Diagnosis not present

## 2021-08-21 DIAGNOSIS — Z7189 Other specified counseling: Secondary | ICD-10-CM

## 2021-08-21 DIAGNOSIS — E039 Hypothyroidism, unspecified: Secondary | ICD-10-CM | POA: Diagnosis not present

## 2021-08-21 DIAGNOSIS — Z5112 Encounter for antineoplastic immunotherapy: Secondary | ICD-10-CM | POA: Diagnosis not present

## 2021-08-21 DIAGNOSIS — M549 Dorsalgia, unspecified: Secondary | ICD-10-CM | POA: Diagnosis not present

## 2021-08-21 LAB — COMPREHENSIVE METABOLIC PANEL
ALT: 14 U/L (ref 0–44)
AST: 23 U/L (ref 15–41)
Albumin: 4 g/dL (ref 3.5–5.0)
Alkaline Phosphatase: 73 U/L (ref 38–126)
Anion gap: 6 (ref 5–15)
BUN: 11 mg/dL (ref 8–23)
CO2: 38 mmol/L — ABNORMAL HIGH (ref 22–32)
Calcium: 9.3 mg/dL (ref 8.9–10.3)
Chloride: 91 mmol/L — ABNORMAL LOW (ref 98–111)
Creatinine, Ser: 0.53 mg/dL — ABNORMAL LOW (ref 0.61–1.24)
GFR, Estimated: >60 mL/min (ref >60)
Glucose, Bld: 107 mg/dL — ABNORMAL HIGH (ref 70–99)
Potassium: 4.3 mmol/L (ref 3.5–5.1)
Sodium: 135 mmol/L (ref 135–145)
Total Bilirubin: 0.5 mg/dL (ref 0.3–1.2)
Total Protein: 7.8 g/dL (ref 6.5–8.1)

## 2021-08-21 LAB — CBC WITH DIFFERENTIAL/PLATELET
Abs Immature Granulocytes: 0.02 10*3/uL (ref 0.00–0.07)
Basophils Absolute: 0 10*3/uL (ref 0.0–0.1)
Basophils Relative: 1 %
Eosinophils Absolute: 0.5 10*3/uL (ref 0.0–0.5)
Eosinophils Relative: 11 %
HCT: 46.1 % (ref 39.0–52.0)
Hemoglobin: 14.4 g/dL (ref 13.0–17.0)
Immature Granulocytes: 0 %
Lymphocytes Relative: 17 %
Lymphs Abs: 0.9 10*3/uL (ref 0.7–4.0)
MCH: 28.9 pg (ref 26.0–34.0)
MCHC: 31.2 g/dL (ref 30.0–36.0)
MCV: 92.4 fL (ref 80.0–100.0)
Monocytes Absolute: 0.6 10*3/uL (ref 0.1–1.0)
Monocytes Relative: 11 %
Neutro Abs: 3.1 10*3/uL (ref 1.7–7.7)
Neutrophils Relative %: 60 %
Platelets: 181 10*3/uL (ref 150–400)
RBC: 4.99 MIL/uL (ref 4.22–5.81)
RDW: 14.7 % (ref 11.5–15.5)
WBC: 5.1 10*3/uL (ref 4.0–10.5)
nRBC: 0 % (ref 0.0–0.2)

## 2021-08-21 MED ORDER — SODIUM CHLORIDE 0.9 % IV SOLN
Freq: Once | INTRAVENOUS | Status: AC
  Filled 2021-08-21: qty 250

## 2021-08-21 MED ORDER — ZOLEDRONIC ACID 4 MG/5ML IV CONC
3.0000 mg | Freq: Once | INTRAVENOUS | Status: AC
  Administered 2021-08-21: 10:00:00 3 mg via INTRAVENOUS
  Filled 2021-08-21: qty 3.75

## 2021-08-21 MED ORDER — SODIUM CHLORIDE 0.9% FLUSH
10.0000 mL | INTRAVENOUS | Status: DC | PRN
  Administered 2021-08-21: 09:00:00 10 mL via INTRAVENOUS
  Filled 2021-08-21: qty 10

## 2021-08-21 MED ORDER — HEPARIN SOD (PORK) LOCK FLUSH 100 UNIT/ML IV SOLN
INTRAVENOUS | Status: AC
  Administered 2021-08-21: 12:00:00 500 [IU] via INTRAVENOUS
  Filled 2021-08-21: qty 5

## 2021-08-21 MED ORDER — HEPARIN SOD (PORK) LOCK FLUSH 100 UNIT/ML IV SOLN
500.0000 [IU] | Freq: Once | INTRAVENOUS | Status: AC
  Filled 2021-08-21: qty 5

## 2021-08-21 MED ORDER — SODIUM CHLORIDE 0.9 % IV SOLN
200.0000 mg | Freq: Once | INTRAVENOUS | Status: AC
  Administered 2021-08-21: 11:00:00 200 mg via INTRAVENOUS
  Filled 2021-08-21: qty 8

## 2021-08-21 NOTE — Assessment & Plan Note (Addendum)
#   Stage IV metastatic non-small cell lung cancer favor adeno; mets to bone; liver.  Currently on Keytruda maintenance- . NOV 2dn, 2022-  Unchanged treated liver metastases; shows subpleural soft tissue metastases posterior aspect of the right upper lobe with involvement of the adjacent ribs.  Given the oligometastatic progression-s/p RT right upper lobe mass [until dec 15th, 2022].   # proceed with [place] maintainance today. Labs today reviewed;  acceptable for treatment today. Will order CT imaging today.   # Bone metastases- on zometa 3 mg IVPB.  Every 6 weeks. Ca-9.1; Zometa today .Clinically- STABLE.   # left shoulder pain- MSK- recommend NSAIDs/Topical. .Clinically-STABLE.  #Debility-spinal cord compression/status post surgery-s/p physical therapy-. Clinically- STABLE.  # Hypothyroidism-  Mid sep started on Synthroid low-dose 25 mcg once a day in the morning- JAN 2022-TSH 5.5.  Normal T3-T4.  Take 50 mcg a day; will reahcek in 6 weeks  # COPD-.Clinically- STABLE. On 2 lit O2; Trelegy.    ZOMETA-? q 6W; TSH # DISPOSITION:  #  Keytruda; Zometa today;  # follow up in 3 weeks- labs- cbc/cmp; Keytruda;-; prior CT CAP-Dr.B    .

## 2021-08-21 NOTE — Progress Notes (Signed)
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   # 2021- MAY 26th-Keytrda x2 cycles; [borderline PS] July 7th-carbo Alimta Keytruda cycle #1  #November 2022-oligometastatic progression-right upper lobe subpleural involvement with; s/p radiation finished December 15.  Temporarily held [place].  #Jan fourth 2023-restarted Keytruda.  # NGS/MOLECULAR TESTS:   # PALLIATIVE CARE EVALUATION:  # PAIN MANAGEMENT:    DIAGNOSIS:   STAGE:         ;  GOALS:  CURRENT/MOST RECENT THERAPY :     Cancer of upper lobe of right lung ([place])  11/23/2019 Initial Diagnosis   Cancer of upper lobe of right lung ([place])   12/21/2019 -  Chemotherapy   Patient is on Treatment Plan : LUNG CARBOplatin / Pemetrexed / Pembrolizumab q21d Induction x 4 cycles / Maintenance Pemetrexed + Pembrolizumab      HISTORY OF PRESENTING ILLNESS: Thin built frail appearing male patient.  No acute distress.  Alone.   Nasal cannula oxygen.  Cody [name] 66 y.o.  male patient with metastatic non-small cell lung cancer; cord compression status post decompressive surgery; currently on Keytruda maintenance-is here for follow-up.  Patient denies any diarrhea or skin rash.  Denies any worsening shortness of breath or cough.   Chronic mild fatigue.   Review of Systems  Constitutional:  Positive for malaise/fatigue. Negative for chills, diaphoresis and fever.  HENT:  Negative for nosebleeds and sore throat.   Eyes:  Negative for double vision.  Respiratory:  Positive for shortness of breath. Negative for cough, hemoptysis, sputum production and wheezing.   Cardiovascular:  Negative for chest pain, palpitations, orthopnea and leg swelling.  Gastrointestinal:  Negative for abdominal pain, blood in stool, constipation, heartburn, melena, nausea and vomiting.  Genitourinary:  Negative for dysuria, frequency and urgency.  Musculoskeletal:  Positive for back pain and joint pain.  Skin: Negative.  Negative for itching and rash.  Neurological:  Negative for dizziness, tingling, focal weakness, weakness and headaches.  Endo/Heme/Allergies:  Does not bruise/bleed easily.  Psychiatric/Behavioral:  Negative for depression. The patient is not nervous/anxious and does not have insomnia.     MEDICAL HISTORY:  Past Medical History:  Diagnosis Date   Allergy    Cancer of upper lobe of right lung ([place]) 10/2019   COPD (chronic obstructive pulmonary disease) (HCC)    Prostate enlargement     SURGICAL HISTORY: Past Surgical History:  Procedure Laterality Date   HERNIA REPAIR Bilateral 1982   HERNIA REPAIR Right 1994   IR IMAGING GUIDED PORT INSERTION  02/24/2020   LAMINECTOMY FOR EXCISION / EVACUATION INTRASPINAL LESION  11/07/2019   T1-T 6 at [facility]     SOCIAL HISTORY: Social History   Socioeconomic History   Marital status: Married    Spouse name: [name]    Number of children: 4   Years of education: Not on file   Highest education level: Some college,  no degree  Occupational History   Occupation: dispatch and delivery   Tobacco Use   Smoking status: Former    Packs/day: 2.00    Years: 30.00    Pack years: 60.00    Types: Cigarettes    Quit date: 07/29/2011    Years since quitting: 10.0   Smokeless tobacco:  Never   Tobacco comments:    pt vapes  Vaping Use   Vaping Use: Not on file  Substance and Sexual Activity   Alcohol use: Yes    Alcohol/week: 5.0 standard drinks    Types: 5 Standard drinks or equivalent per week    Comment: 1-2/day   Drug use: No   Sexual activity: Not Currently    Partners: Female  Other Topics Concern   Not on file  Social History Narrative   Worked in warehouse/ quit smoking in 2015; beer 1/day; in [place]; with wife.    Social Determinants of Health   Financial Resource Strain: Not on file  Food Insecurity: Not on file  Transportation Needs: Not on file  Physical Activity: Not on file  Stress: Not on file  Social Connections: Not on file  Intimate Partner Violence: Not on file    FAMILY HISTORY: Family History  Problem Relation Age of Onset   Heart disease Father    CVA Mother    Lung cancer Sister    Lung cancer Brother     ALLERGIES:  has No Known Allergies.  MEDICATIONS:  Current Outpatient Medications  Medication Sig Dispense Refill   acetaminophen (TYLENOL) 500 MG tablet Take 500-1,000 mg by mouth every 6 (six) hours as needed for mild pain or fever.      albuterol (VENTOLIN HFA) 108 (90 Base) MCG/ACT inhaler Inhale 2 puffs into the lungs every 4 (four) hours as needed for wheezing or shortness of breath. 18 g 0   Calcium Carb-Cholecalciferol (OYSTER SHELL CALCIUM) 500-400 MG-UNIT TABS Take 500 tablets by mouth.     escitalopram (LEXAPRO) 5 MG tablet Take 1 tablet (5 mg total) by mouth daily. am's 90 tablet 1   Fluticasone-Umeclidin-Vilant (TRELEGY ELLIPTA) 100-62.5-25 MCG/INH AEPB Inhale 1 puff into the lungs daily. 180 each 1   levothyroxine (SYNTHROID) 25 MCG tablet Take 1 tablet (25 mcg total) by mouth daily before breakfast. 90 tablet 1   lidocaine-prilocaine (EMLA) cream Apply 1 application topically as needed. Apply small amount to port site at least 1 hour prior to it being accessed, cover with plastic wrap 30 g 1   ondansetron  (ZOFRAN) 4 MG tablet Take 1 tablet (4 mg total) by mouth every 8 (eight) hours as needed for nausea or vomiting. 20 tablet 2   prochlorperazine (COMPAZINE) 10 MG tablet Take 1 tablet (10 mg total) by mouth every 6 (six) hours as needed for nausea or vomiting. 40 tablet 1   QUEtiapine (SEROQUEL) 25 MG tablet Take 1 tablet (25 mg total) by mouth at bedtime. 90 tablet 1   Multiple Vitamins-Minerals (MULTIVITAMIN GUMMIES MENS PO) Take 1 Dose by mouth daily.      No current facility-administered medications for this visit.   Facility-Administered Medications Ordered in Other Visits  Medication Dose Route Frequency Provider Last Rate Last Admin   sodium chloride flush (NS) 0.9 % injection 10 mL  10 mL Intravenous PRN Cammie Sickle, MD   10 mL at 08/21/21 0844      .  PHYSICAL EXAMINATION: ECOG PERFORMANCE STATUS: 3 - Symptomatic, >50% confined to bed  Vitals:  08/21/21 0855  BP: (!) 143/79  Pulse: 81  Temp: (!) 96 F (35.6 C)  SpO2: 100%   Filed Weights   08/21/21 0855  Weight: 126 lb 3.2 oz (57.2 kg)    Physical Exam HENT:     Head: Normocephalic and atraumatic.     Mouth/Throat:     Pharynx: No oropharyngeal exudate.  Eyes:     Pupils: Pupils are equal, round, and reactive to light.  Cardiovascular:     Rate and Rhythm: Normal rate and regular rhythm.  Pulmonary:     Effort: No respiratory distress.     Breath sounds: No wheezing.     Comments: Decreased air entry bilaterally. Abdominal:     General: Bowel sounds are normal. There is no distension.     Palpations: Abdomen is soft. There is no mass.     Tenderness: There is no abdominal tenderness. There is no guarding or rebound.  Musculoskeletal:        General: No tenderness. Normal range of motion.     Cervical back: Normal range of motion and neck supple.  Skin:    General: Skin is warm.  Neurological:     Mental Status: He is alert and oriented to person, place, and time.  Psychiatric:        Mood and  Affect: Affect normal.     LABORATORY DATA:  I have reviewed the data as listed Lab Results  Component Value Date   WBC 5.1 08/21/2021   HGB 14.4 08/21/2021   HCT 46.1 08/21/2021   MCV 92.4 08/21/2021   PLT 181 08/21/2021   Recent Labs    06/10/21 0844 07/31/21 0809 08/21/21 0844  NA 135 133* 135  K 4.3 3.9 4.3  CL 94* 93* 91*  CO2 33* 34* 38*  GLUCOSE 102* 107* 107*  BUN _0 CREATININE 0.54* 0.47* 0.53*  CALCIUM 8.7* 8.8* 9.3  GFRNONAA >60 >60 >60  PROT 8.2* 7.7 7.8  ALBUMIN 3.7 3.6 4.0  AST _1 ALT _2 ALKPHOS 76 74 73  BILITOT 0.3 0.4 0.5    RADIOGRAPHIC STUDIES: I have personally reviewed the radiological images as listed and agreed with the findings in the report. No results found.   ASSESSMENT & PLAN:   Cancer of upper lobe of right lung ([place]) # Stage IV metastatic non-small cell lung cancer favor adeno; mets to bone; liver.  Currently on Keytruda maintenance- . NOV 2dn, 2022-  Unchanged treated liver metastases; shows subpleural soft tissue metastases posterior aspect of the right upper lobe with involvement of the adjacent ribs.  Given the oligometastatic progression-s/p RT right upper lobe mass [until dec 15th, 2022].   # proceed with [place] maintainance today. Labs today reviewed;  acceptable for treatment today. Will order CT imaging today.   # Bone metastases- on zometa 3 mg IVPB.  Every 6 weeks. Ca-9.1; Zometa today .Clinically- STABLE.   # left shoulder pain- MSK- recommend NSAIDs/Topical. .Clinically-STABLE.  #Debility-spinal cord compression/status post surgery-s/p physical therapy-. Clinically- STABLE.  # Hypothyroidism-  Mid sep started on Synthroid low-dose 25 mcg once a day in the morning- JAN 2022-TSH 5.5.  Normal T3-T4.  Take 50 mcg a day; will reahcek in 6 weeks  # COPD-.Clinically- STABLE. On 2 lit O2; Trelegy.    ZOMETA-? q 6W; TSH # DISPOSITION:  #  Keytruda; Zometa today;  # follow up in 3 weeks- labs-  cbc/cmp; Keytruda;-; prior CT CAP-Dr.B    .  All questions were answered. The patient knows to call the clinic with any problems, questions or concerns.    Cammie Sickle, MD 08/21/2021 11:45 AM

## 2021-08-21 NOTE — Patient Instructions (Signed)
[facility] CANCER CTR AT [place]   Discharge Instructions: Thank you for choosing [place] [facility] to provide your oncology and hematology care.  If you have a lab appointment with the [facility], please go directly to the [place] and check in at the registration area.   Wear comfortable clothing and clothing appropriate for easy access to any Portacath or PICC line.   We strive to give you quality time with your provider. You may need to reschedule your appointment if you arrive late (15 or more minutes).  Arriving late affects you and other patients whose appointments are after yours.  Also, if you miss three or more appointments without notifying the office, you may be dismissed from the clinic at the providers discretion.      For prescription refill requests, have your pharmacy contact our office and allow 72 hours for refills to be completed.    Today you received the following chemotherapy and/or immunotherapy agents: Keytruda. Today you also received the following: Zometa.      To help prevent nausea and vomiting after your treatment, we encourage you to take your nausea medication as directed.  BELOW ARE SYMPTOMS THAT SHOULD BE REPORTED IMMEDIATELY: *FEVER GREATER THAN 100.4 F (38 C) OR HIGHER *CHILLS OR SWEATING *NAUSEA AND VOMITING THAT IS NOT CONTROLLED WITH YOUR NAUSEA MEDICATION *UNUSUAL SHORTNESS OF BREATH *UNUSUAL BRUISING OR BLEEDING *URINARY PROBLEMS (pain or burning when urinating, or frequent urination) *BOWEL PROBLEMS (unusual diarrhea, constipation, pain near the anus) TENDERNESS IN MOUTH AND THROAT WITH OR WITHOUT PRESENCE OF ULCERS (sore throat, sores in mouth, or a toothache) UNUSUAL RASH, SWELLING OR PAIN  UNUSUAL VAGINAL DISCHARGE OR ITCHING   Items with * indicate a potential emergency and should be followed up as soon as possible or go to the Emergency Department if any problems should occur.  Please show the CHEMOTHERAPY  ALERT CARD or IMMUNOTHERAPY ALERT CARD at check-in to the Emergency Department and triage nurse.  Should you have questions after your visit or need to cancel or reschedule your appointment, please contact [place] AT [place]  Dept: [phone]  and follow the prompts.  Office hours are 8:00 a.m. to 4:30 p.m. Monday - Friday. Please note that voicemails left after 4:00 p.m. may not be returned until the following business day.  We are closed weekends and major holidays. You have access to a nurse at all times for urgent questions. Please call the main number to the clinic Dept: [phone] and follow the prompts.  For any non-urgent questions, you may also contact your provider using MyChart. We now offer e-Visits for anyone 10 and older to request care online for non-urgent symptoms. For details visit [email or web address].   Also download the MyChart app! Go to the app store, search "MyChart", open the app, select , and log in with your MyChart username and password.  Due to Covid, a mask is required upon entering the hospital/clinic. If you do not have a mask, one will be given to you upon arrival. For doctor visits, patients may have 1 support person aged 59 or older with them. For treatment visits, patients cannot have anyone with them due to current Covid guidelines and our immunocompromised population.

## 2021-08-21 NOTE — Patient Instructions (Signed)
#  Take 2 Synthroid's-total of 50 mcg once a day in the morning/empty stomach.

## 2021-08-22 ENCOUNTER — Ambulatory Visit
Admission: RE | Admit: 2021-08-22 | Discharge: 2021-08-22 | Disposition: A | Discharge status: Home / Self Care | Payer: Medicare HMO | Source: Ambulatory Visit | Attending: Radiation Oncology | Admitting: Radiation Oncology

## 2021-08-22 ENCOUNTER — Encounter: Payer: Self-pay | Admitting: Radiation Oncology

## 2021-08-22 VITALS — BP 147/84 | HR 83 | Temp 95.0°F | Resp 18 | Wt 125.5 lb

## 2021-08-22 DIAGNOSIS — C787 Secondary malignant neoplasm of liver and intrahepatic bile duct: Secondary | ICD-10-CM | POA: Diagnosis not present

## 2021-08-22 DIAGNOSIS — C3411 Malignant neoplasm of upper lobe, right bronchus or lung: Secondary | ICD-10-CM | POA: Insufficient documentation

## 2021-08-22 DIAGNOSIS — Z923 Personal history of irradiation: Secondary | ICD-10-CM | POA: Diagnosis not present

## 2021-08-22 DIAGNOSIS — R059 Cough, unspecified: Secondary | ICD-10-CM | POA: Insufficient documentation

## 2021-08-22 DIAGNOSIS — C7951 Secondary malignant neoplasm of bone: Secondary | ICD-10-CM | POA: Diagnosis not present

## 2021-08-22 NOTE — Progress Notes (Signed)
Radiation Oncology Follow up Note  Name: Cody [name]   Date:   08/22/2021 MRN:  [id number] DOB: 1956-07-06    This 66 y.o. male presents to the clinic today for 1 month follow-up status post palliative radiation therapy to his chest in a patient with known stage IV lung cancer with extensive metastatic disease including spine and liver.Marland Kitchen  REFERRING PROVIDER: Steele Sizer, MD  HPI: Patient is a 66 year old male now at 1 month having received palliative radiation therapy to his chest.  He is status post decompression for cord compression at [facility] he received palliative radiation therapy to his spine back in 2021.  Currently on Keytruda.  We he is now 1 month out from palliative radiation therapy to right upper lobe lesion measuring 5 cm.Marland Kitchen  He is seen today in routine follow-up is doing well he states he has a mild cough no chest tightness hemoptysis for pain.  He is currently on Keytruda tolerating that well.  COMPLICATIONS OF TREATMENT: none  FOLLOW UP COMPLIANCE: keeps appointments   PHYSICAL EXAM:  BP (!) 147/84 (BP Location: Left Arm)    Pulse 83    Temp (!) 95 F (35 C) (Tympanic)    Resp 18    Wt 125 lb 8 oz (56.9 kg)    BMI 18.53 kg/m  Frail-appearing male in NAD.  Well-developed well-nourished patient in NAD. HEENT reveals PERLA, EOMI, discs not visualized.  Oral cavity is clear. No oral mucosal lesions are identified. Neck is clear without evidence of cervical or supraclavicular adenopathy. Lungs are clear to A&P. Cardiac examination is essentially unremarkable with regular rate and rhythm without murmur rub or thrill. Abdomen is benign with no organomegaly or masses noted. Motor sensory and DTR levels are equal and symmetric in the upper and lower extremities. Cranial nerves II through XII are grossly intact. Proprioception is intact. No peripheral adenopathy or edema is identified. No motor or sensory levels are noted. Crude visual fields are within normal range.  RADIOLOGY  RESULTS: No current films for review  PLAN: Present time patient will continue on immunotherapy under medical oncology's direction.  I would be happy to reevaluate the patient anytime should further palliative treatment be indicated.  I have set up a 4 to 31-month follow-up.  Patient knows to call with any concerns.  I would like to take this opportunity to thank you for allowing me to participate in the care of your patient.Noreene Filbert, MD

## 2021-09-01 DIAGNOSIS — J9601 Acute respiratory failure with hypoxia: Secondary | ICD-10-CM | POA: Diagnosis not present

## 2021-09-01 DIAGNOSIS — J441 Chronic obstructive pulmonary disease with (acute) exacerbation: Secondary | ICD-10-CM | POA: Diagnosis not present

## 2021-09-06 ENCOUNTER — Other Ambulatory Visit: Payer: Self-pay

## 2021-09-06 ENCOUNTER — Ambulatory Visit
Admission: RE | Admit: 2021-09-06 | Discharge: 2021-09-06 | Disposition: A | Discharge status: Home / Self Care | Payer: Medicare HMO | Source: Ambulatory Visit | Attending: Internal Medicine | Admitting: Internal Medicine

## 2021-09-06 DIAGNOSIS — C3411 Malignant neoplasm of upper lobe, right bronchus or lung: Secondary | ICD-10-CM | POA: Diagnosis not present

## 2021-09-06 DIAGNOSIS — C787 Secondary malignant neoplasm of liver and intrahepatic bile duct: Secondary | ICD-10-CM | POA: Diagnosis not present

## 2021-09-06 DIAGNOSIS — I700 Atherosclerosis of aorta: Secondary | ICD-10-CM | POA: Diagnosis not present

## 2021-09-06 DIAGNOSIS — I2510 Atherosclerotic heart disease of native coronary artery without angina pectoris: Secondary | ICD-10-CM | POA: Diagnosis not present

## 2021-09-06 DIAGNOSIS — J929 Pleural plaque without asbestos: Secondary | ICD-10-CM | POA: Diagnosis not present

## 2021-09-06 DIAGNOSIS — C3490 Malignant neoplasm of unspecified part of unspecified bronchus or lung: Secondary | ICD-10-CM | POA: Diagnosis not present

## 2021-09-06 DIAGNOSIS — K769 Liver disease, unspecified: Secondary | ICD-10-CM | POA: Diagnosis not present

## 2021-09-06 DIAGNOSIS — J432 Centrilobular emphysema: Secondary | ICD-10-CM | POA: Diagnosis not present

## 2021-09-06 MED ORDER — IOHEXOL 300 MG/ML  SOLN
100.0000 mL | Freq: Once | INTRAMUSCULAR | Status: AC | PRN
  Administered 2021-09-06: 100 mL via INTRAVENOUS

## 2021-09-09 NOTE — Progress Notes (Signed)
I spoke to patient regarding the partial response noted on the CT scan. Patient to keep appointments as planned.

## 2021-09-11 ENCOUNTER — Encounter: Payer: Self-pay | Admitting: Internal Medicine

## 2021-09-11 ENCOUNTER — Other Ambulatory Visit: Payer: Self-pay

## 2021-09-11 ENCOUNTER — Inpatient Hospital Stay: Payer: Medicare HMO

## 2021-09-11 ENCOUNTER — Inpatient Hospital Stay: Payer: Medicare HMO | Attending: Internal Medicine

## 2021-09-11 ENCOUNTER — Inpatient Hospital Stay: Payer: Medicare HMO | Admitting: Internal Medicine

## 2021-09-11 DIAGNOSIS — Z5112 Encounter for antineoplastic immunotherapy: Secondary | ICD-10-CM | POA: Insufficient documentation

## 2021-09-11 DIAGNOSIS — C787 Secondary malignant neoplasm of liver and intrahepatic bile duct: Secondary | ICD-10-CM | POA: Insufficient documentation

## 2021-09-11 DIAGNOSIS — G9529 Other cord compression: Secondary | ICD-10-CM | POA: Insufficient documentation

## 2021-09-11 DIAGNOSIS — I2510 Atherosclerotic heart disease of native coronary artery without angina pectoris: Secondary | ICD-10-CM | POA: Diagnosis not present

## 2021-09-11 DIAGNOSIS — J432 Centrilobular emphysema: Secondary | ICD-10-CM | POA: Insufficient documentation

## 2021-09-11 DIAGNOSIS — Z79890 Hormone replacement therapy (postmenopausal): Secondary | ICD-10-CM | POA: Diagnosis not present

## 2021-09-11 DIAGNOSIS — C3411 Malignant neoplasm of upper lobe, right bronchus or lung: Secondary | ICD-10-CM | POA: Insufficient documentation

## 2021-09-11 DIAGNOSIS — M549 Dorsalgia, unspecified: Secondary | ICD-10-CM | POA: Diagnosis not present

## 2021-09-11 DIAGNOSIS — R5383 Other fatigue: Secondary | ICD-10-CM | POA: Insufficient documentation

## 2021-09-11 DIAGNOSIS — Z8249 Family history of ischemic heart disease and other diseases of the circulatory system: Secondary | ICD-10-CM | POA: Insufficient documentation

## 2021-09-11 DIAGNOSIS — M2550 Pain in unspecified joint: Secondary | ICD-10-CM | POA: Diagnosis not present

## 2021-09-11 DIAGNOSIS — Z79899 Other long term (current) drug therapy: Secondary | ICD-10-CM | POA: Insufficient documentation

## 2021-09-11 DIAGNOSIS — Z801 Family history of malignant neoplasm of trachea, bronchus and lung: Secondary | ICD-10-CM | POA: Diagnosis not present

## 2021-09-11 DIAGNOSIS — E039 Hypothyroidism, unspecified: Secondary | ICD-10-CM | POA: Insufficient documentation

## 2021-09-11 DIAGNOSIS — C7951 Secondary malignant neoplasm of bone: Secondary | ICD-10-CM | POA: Diagnosis not present

## 2021-09-11 DIAGNOSIS — M25512 Pain in left shoulder: Secondary | ICD-10-CM | POA: Insufficient documentation

## 2021-09-11 DIAGNOSIS — I700 Atherosclerosis of aorta: Secondary | ICD-10-CM | POA: Diagnosis not present

## 2021-09-11 DIAGNOSIS — Z87891 Personal history of nicotine dependence: Secondary | ICD-10-CM | POA: Insufficient documentation

## 2021-09-11 DIAGNOSIS — R0602 Shortness of breath: Secondary | ICD-10-CM | POA: Insufficient documentation

## 2021-09-11 DIAGNOSIS — N400 Enlarged prostate without lower urinary tract symptoms: Secondary | ICD-10-CM | POA: Diagnosis not present

## 2021-09-11 DIAGNOSIS — K838 Other specified diseases of biliary tract: Secondary | ICD-10-CM | POA: Diagnosis not present

## 2021-09-11 DIAGNOSIS — Z7189 Other specified counseling: Secondary | ICD-10-CM

## 2021-09-11 LAB — CBC WITH DIFFERENTIAL/PLATELET
Abs Immature Granulocytes: 0.01 10*3/uL (ref 0.00–0.07)
Basophils Absolute: 0.1 10*3/uL (ref 0.0–0.1)
Basophils Relative: 1 %
Eosinophils Absolute: 0.6 10*3/uL — ABNORMAL HIGH (ref 0.0–0.5)
Eosinophils Relative: 11 %
HCT: 47.5 % (ref 39.0–52.0)
Hemoglobin: 14.8 g/dL (ref 13.0–17.0)
Immature Granulocytes: 0 %
Lymphocytes Relative: 19 %
Lymphs Abs: 1 10*3/uL (ref 0.7–4.0)
MCH: 29.1 pg (ref 26.0–34.0)
MCHC: 31.2 g/dL (ref 30.0–36.0)
MCV: 93.3 fL (ref 80.0–100.0)
Monocytes Absolute: 0.6 10*3/uL (ref 0.1–1.0)
Monocytes Relative: 11 %
Neutro Abs: 3 10*3/uL (ref 1.7–7.7)
Neutrophils Relative %: 58 %
Platelets: 190 10*3/uL (ref 150–400)
RBC: 5.09 MIL/uL (ref 4.22–5.81)
RDW: 14.9 % (ref 11.5–15.5)
WBC: 5.3 10*3/uL (ref 4.0–10.5)
nRBC: 0 % (ref 0.0–0.2)

## 2021-09-11 LAB — COMPREHENSIVE METABOLIC PANEL
ALT: 17 U/L (ref 0–44)
AST: 25 U/L (ref 15–41)
Albumin: 4.1 g/dL (ref 3.5–5.0)
Alkaline Phosphatase: 67 U/L (ref 38–126)
Anion gap: 5 (ref 5–15)
BUN: 8 mg/dL (ref 8–23)
CO2: 37 mmol/L — ABNORMAL HIGH (ref 22–32)
Calcium: 9.2 mg/dL (ref 8.9–10.3)
Chloride: 94 mmol/L — ABNORMAL LOW (ref 98–111)
Creatinine, Ser: 0.54 mg/dL — ABNORMAL LOW (ref 0.61–1.24)
GFR, Estimated: >60 mL/min (ref >60)
Glucose, Bld: 108 mg/dL — ABNORMAL HIGH (ref 70–99)
Potassium: 4.1 mmol/L (ref 3.5–5.1)
Sodium: 136 mmol/L (ref 135–145)
Total Bilirubin: 0.4 mg/dL (ref 0.3–1.2)
Total Protein: 7.8 g/dL (ref 6.5–8.1)

## 2021-09-11 MED ORDER — SODIUM CHLORIDE 0.9 % IV SOLN
200.0000 mg | Freq: Once | INTRAVENOUS | Status: AC
  Administered 2021-09-11: 200 mg via INTRAVENOUS
  Filled 2021-09-11: qty 200

## 2021-09-11 MED ORDER — HEPARIN SOD (PORK) LOCK FLUSH 100 UNIT/ML IV SOLN
500.0000 [IU] | Freq: Once | INTRAVENOUS | Status: AC | PRN
  Filled 2021-09-11: qty 5

## 2021-09-11 MED ORDER — HEPARIN SOD (PORK) LOCK FLUSH 100 UNIT/ML IV SOLN
INTRAVENOUS | Status: AC
  Administered 2021-09-11: 500 [IU]
  Filled 2021-09-11: qty 5

## 2021-09-11 MED ORDER — SODIUM CHLORIDE 0.9 % IV SOLN
Freq: Once | INTRAVENOUS | Status: AC
  Filled 2021-09-11: qty 250

## 2021-09-11 NOTE — Patient Instructions (Signed)
[facility] CANCER CTR AT [place]  Discharge Instructions: Thank you for choosing [facility] to provide your oncology and hematology care.  If you have a lab appointment with the [facility], please go directly to the [place] [facility] and check in at the registration area.  Wear comfortable clothing and clothing appropriate for easy access to any Portacath or PICC line.   We strive to give you quality time with your provider. You may need to reschedule your appointment if you arrive late (15 or more minutes).  Arriving late affects you and other patients whose appointments are after yours.  Also, if you miss three or more appointments without notifying the office, you may be dismissed from the clinic at the providers discretion.      For prescription refill requests, have your pharmacy contact our office and allow 72 hours for refills to be completed.    Today you received the following chemotherapy and/or immunotherapy agents Cody [name]   To help prevent nausea and vomiting after your treatment, we encourage you to take your nausea medication as directed.  BELOW ARE SYMPTOMS THAT SHOULD BE REPORTED IMMEDIATELY: *FEVER GREATER THAN 100.4 F (38 C) OR HIGHER *CHILLS OR SWEATING *NAUSEA AND VOMITING THAT IS NOT CONTROLLED WITH YOUR NAUSEA MEDICATION *UNUSUAL SHORTNESS OF BREATH *UNUSUAL BRUISING OR BLEEDING *URINARY PROBLEMS (pain or burning when urinating, or frequent urination) *BOWEL PROBLEMS (unusual diarrhea, constipation, pain near the anus) TENDERNESS IN MOUTH AND THROAT WITH OR WITHOUT PRESENCE OF ULCERS (sore throat, sores in mouth, or a toothache) UNUSUAL RASH, SWELLING OR PAIN  UNUSUAL VAGINAL DISCHARGE OR ITCHING   Items with * indicate a potential emergency and should be followed up as soon as possible or go to the Emergency Department if any problems should occur.  Please show the CHEMOTHERAPY ALERT CARD or IMMUNOTHERAPY ALERT CARD at check-in to the  Emergency Department and triage nurse.  Should you have questions after your visit or need to cancel or reschedule your appointment, please contact [facility] CANCER [place] AT [place]  [phone] and follow the prompts.  Office hours are 8:00 a.m. to 4:30 p.m. Monday - Friday. Please note that voicemails left after 4:00 p.m. may not be returned until the following business day.  We are closed weekends and major holidays. You have access to a nurse at all times for urgent questions. Please call the main number to the clinic [phone] and follow the prompts.  For any non-urgent questions, you may also contact your provider using MyChart. We now offer e-Visits for anyone 46 and older to request care online for non-urgent symptoms. For details visit [email or web address].   Also download the MyChart app! Go to the app store, search "MyChart", open the app, select [place], and log in with your MyChart username and password.  Due to Covid, a mask is required upon entering the hospital/clinic. If you do not have a mask, one will be given to you upon arrival. For doctor visits, patients may have 1 support person aged 62 or older with them. For treatment visits, patients cannot have anyone with them due to current Covid guidelines and our immunocompromised population.

## 2021-09-11 NOTE — Assessment & Plan Note (Addendum)
#   Stage IV metastatic non-small cell lung cancer favor adeno; mets to bone; liver.  Currently on Keytruda maintenance-  Given the oligometastatic progression-s/p RT right upper lobe mass [until dec 15th, 2022]. FEB 9th, 2023-  Significant interval decrease in size of a previouslyvhypermetabolic soft tissue mass about the posterior right upperlobe; multiple treated liver metastases/bone metastases unchanged.  # proceed with [place] maintainance today. Labs today reviewed;  acceptable for treatment today.   # Bone metastases- on zometa 3 mg IVPB.  Every 6 weeks. Ca-9.1;Clinically- STABLE.   # left shoulder pain- MSK- recommend NSAIDs/Topical. .Clinically-STABLE  #Debility-spinal cord compression/status post surgery-s/p physical therapy-. Clinically-STABLE  # Hypothyroidism-  Mid sep started on Synthroid low-dose 25 mcg once a day in the morning- JAN 2022-TSH 5.5.  Normal T3-T4.  Take 50 mcg a day; will recheck in 3  Weeks; STABLE.  # COPD-. On 2 lit O2; Trelegy.Clinically- STABLE.  #Incidental findings on Imaging CT FEB, 2023:Severe emphysema; Prostatomegaly; Coronary artery disease.  I reviewed/discussed/counseled the patient.     ZOMETA-? q 6W; TSH  # DISPOSITION:  #  Keytruda;  # follow up in 3 weeks- labs- cbc/cmp;Thyroid profile- Keytruda;Zometa today; -Dr.B  # I reviewed the blood work- with the patient in detail; also reviewed the imaging independently [as summarized above]; and with the patient in detail.      Marland Kitchen

## 2021-09-11 NOTE — Progress Notes (Signed)
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   # 2021- MAY 26th-Keytrda x2 cycles; [borderline PS] July 7th-carbo Alimta Keytruda cycle #1  #November 2022-oligometastatic progression-right upper lobe subpleural involvement with; s/p radiation finished December 15.  Temporarily held [place].  #Jan fourth 2023-restarted Keytruda.  # NGS/MOLECULAR TESTS:   # PALLIATIVE CARE EVALUATION:  # PAIN MANAGEMENT:    DIAGNOSIS:   STAGE:         ;  GOALS:  CURRENT/MOST RECENT THERAPY :     Cancer of upper lobe of right lung ([place])  11/23/2019 Initial Diagnosis   Cancer of upper lobe of right lung ([place])   12/21/2019 -  Chemotherapy   Patient is on Treatment Plan : LUNG CARBOplatin / Pemetrexed / Pembrolizumab q21d Induction x 4 cycles / Maintenance Pemetrexed + Pembrolizumab      HISTORY OF PRESENTING ILLNESS: Thin built frail appearing male patient.  No acute distress.  Alone.   Nasal cannula oxygen.  Cody [name] 66 y.o.  male patient with metastatic non-small cell lung cancer; cord compression status post decompressive surgery; currently on Keytruda maintenance-is here for follow-up/review results of the CT scan  Patient denies any nausea vomiting.  Any fevers or chills.  Chronic  mild fatigue.   Review of Systems  Constitutional:  Positive for malaise/fatigue. Negative for chills, diaphoresis and fever.  HENT:  Negative for nosebleeds and sore throat.   Eyes:  Negative for double vision.  Respiratory:  Positive for shortness of breath. Negative for cough, hemoptysis, sputum production and wheezing.   Cardiovascular:  Negative for chest pain, palpitations, orthopnea and leg swelling.  Gastrointestinal:  Negative for abdominal pain, blood in stool, constipation, heartburn, melena, nausea and vomiting.  Genitourinary:  Negative for dysuria, frequency and urgency.  Musculoskeletal:  Positive for back pain and joint pain.  Skin: Negative.  Negative for itching and rash.  Neurological:  Negative for dizziness, tingling, focal weakness, weakness and headaches.  Endo/Heme/Allergies:  Does not bruise/bleed easily.  Psychiatric/Behavioral:  Negative for depression. The patient is not nervous/anxious and does not have insomnia.     MEDICAL HISTORY:  Past Medical History:  Diagnosis Date   Allergy    Cancer of upper lobe of right lung ([place]) 10/2019   COPD (chronic obstructive pulmonary disease) (HCC)    Prostate enlargement     SURGICAL HISTORY: Past Surgical History:  Procedure Laterality Date   HERNIA REPAIR Bilateral 1982   HERNIA REPAIR Right 1994   IR IMAGING GUIDED PORT INSERTION  02/24/2020   LAMINECTOMY FOR EXCISION / EVACUATION INTRASPINAL LESION  11/07/2019   T1-T 6 at [facility]     SOCIAL HISTORY: Social History   Socioeconomic History   Marital status: Married    Spouse name: [name]    Number of children: 4   Years of education: Not on file   Highest education level: Some college, no  degree  Occupational History   Occupation: dispatch and delivery   Tobacco Use   Smoking status: Former    Packs/day: 2.00    Years: 30.00    Pack years: 60.00    Types: Cigarettes    Quit date: 07/29/2011    Years since quitting: 10.1   Smokeless tobacco: Never    Tobacco comments:    pt vapes  Vaping Use   Vaping Use: Not on file  Substance and Sexual Activity   Alcohol use: Yes    Alcohol/week: 5.0 standard drinks    Types: 5 Standard drinks or equivalent per week    Comment: 1-2/day   Drug use: No   Sexual activity: Not Currently    Partners: Female  Other Topics Concern   Not on file  Social History Narrative   Worked in warehouse/ quit smoking in 2015; beer 1/day; in [place]; with wife.    Social Determinants of Health   Financial Resource Strain: Not on file  Food Insecurity: Not on file  Transportation Needs: Not on file  Physical Activity: Not on file  Stress: Not on file  Social Connections: Not on file  Intimate Partner Violence: Not on file    FAMILY HISTORY: Family History  Problem Relation Age of Onset   Heart disease Father    CVA Mother    Lung cancer Sister    Lung cancer Brother     ALLERGIES:  has No Known Allergies.  MEDICATIONS:  Current Outpatient Medications  Medication Sig Dispense Refill   acetaminophen (TYLENOL) 500 MG tablet Take 500-1,000 mg by mouth every 6 (six) hours as needed for mild pain or fever.      albuterol (VENTOLIN HFA) 108 (90 Base) MCG/ACT inhaler Inhale 2 puffs into the lungs every 4 (four) hours as needed for wheezing or shortness of breath. 18 g 0   Calcium Carb-Cholecalciferol (OYSTER SHELL CALCIUM) 500-400 MG-UNIT TABS Take 500 tablets by mouth.     escitalopram (LEXAPRO) 5 MG tablet Take 1 tablet (5 mg total) by mouth daily. am's 90 tablet 1   Fluticasone-Umeclidin-Vilant (TRELEGY ELLIPTA) 100-62.5-25 MCG/INH AEPB Inhale 1 puff into the lungs daily. 180 each 1   levothyroxine (SYNTHROID) 25 MCG tablet Take 1 tablet (25 mcg total) by mouth daily before breakfast. 90 tablet 1   lidocaine-prilocaine (EMLA) cream Apply 1 application topically as needed. Apply small amount to port site at least 1 hour prior to it being accessed, cover with plastic wrap 30 g 1   ondansetron (ZOFRAN)  4 MG tablet Take 1 tablet (4 mg total) by mouth every 8 (eight) hours as needed for nausea or vomiting. 20 tablet 2   prochlorperazine (COMPAZINE) 10 MG tablet Take 1 tablet (10 mg total) by mouth every 6 (six) hours as needed for nausea or vomiting. 40 tablet 1   QUEtiapine (SEROQUEL) 25 MG tablet Take 1 tablet (25 mg total) by mouth at bedtime. 90 tablet 1   No current facility-administered medications for this visit.      Marland Kitchen  PHYSICAL EXAMINATION: ECOG PERFORMANCE STATUS: 3 - Symptomatic, >50% confined to bed  Vitals:   09/11/21 0839  BP: (!) 151/81  Pulse: 76  Temp: (!) 95.5 F (35.3 C)  SpO2: 100%   Filed Weights   09/11/21 0839  Weight: 126 lb 6.4 oz (57.3 kg)    Physical Exam HENT:     Head: Normocephalic and atraumatic.     Mouth/Throat:     Pharynx: No oropharyngeal exudate.  Eyes:     Pupils: Pupils are equal, round, and reactive to light.  Cardiovascular:     Rate and Rhythm: Normal rate and regular rhythm.  Pulmonary:     Effort: No respiratory distress.     Breath sounds: No wheezing.     Comments: Decreased air entry bilaterally. Abdominal:     General: Bowel sounds are normal. There is no distension.     Palpations: Abdomen is soft. There is no mass.     Tenderness: There is no abdominal tenderness. There is no guarding or rebound.  Musculoskeletal:        General: No tenderness. Normal range of motion.     Cervical back: Normal range of motion and neck supple.  Skin:    General: Skin is warm.  Neurological:     Mental Status: He is alert and oriented to person, place, and time.  Psychiatric:        Mood and Affect: Affect normal.     LABORATORY DATA:  I have reviewed the data as listed Lab Results  Component Value Date   WBC 5.3 09/11/2021   HGB 14.8 09/11/2021   HCT 47.5 09/11/2021   MCV 93.3 09/11/2021   PLT 190 09/11/2021   Recent Labs    07/31/21 0809 08/21/21 0844 09/11/21 0826  NA 133* 135 136  K 3.9 4.3 4.1  CL 93* 91* 94*   CO2 34* 38* 37*  GLUCOSE 107* 107* 108*  BUN 11 11 8   CREATININE 0.47* 0.53* 0.54*  CALCIUM 8.8* 9.3 9.2  GFRNONAA >60 >60 >60  PROT 7.7 7.8 7.8  ALBUMIN 3.6 4.0 4.1  AST 20 23 25   ALT 12 14 17   ALKPHOS 74 73 67  BILITOT 0.4 0.5 0.4    RADIOGRAPHIC STUDIES: I have personally reviewed the radiological images as listed and agreed with the findings in the report. CT CHEST ABDOMEN PELVIS W CONTRAST  Result Date: 09/07/2021 CLINICAL DATA:  Metastatic non-small cell lung cancer restaging, history of chemotherapy and radiation, ongoing immunotherapy EXAM: CT CHEST, ABDOMEN, AND PELVIS WITH CONTRAST TECHNIQUE: Multidetector CT imaging of the chest, abdomen and pelvis was performed following the standard protocol during bolus administration of intravenous contrast. RADIATION DOSE REDUCTION: This exam was performed according to the departmental dose-optimization program which includes automated exposure control, adjustment of the mA and/or kV according to patient size and/or use of iterative reconstruction technique. CONTRAST:  133m OMNIPAQUE IOHEXOL 300 MG/ML SOLN, additional oral enteric contrast COMPARISON:  PET-CT, 05/28/2021, CT chest abdomen pelvis, 05/17/2021 FINDINGS: CT CHEST FINDINGS Cardiovascular: Right chest port catheter. Aortic atherosclerosis. Normal heart size. Left and right coronary artery calcifications. No pericardial effusion. Mediastinum/Nodes: No enlarged mediastinal, hilar, or axillary lymph nodes. Thyroid gland, trachea, and esophagus demonstrate no significant findings. Lungs/Pleura: Severe centrilobular and paraseptal emphysema. Diffuse bilateral bronchial wall thickening. There has been significant interval decrease in size of a hypermetabolic soft tissue mass about the posterior right upper lobe, now difficult to discretely measure but no greater than 2.5 x 1.1 cm, previously 4.9 x 2.4 cm when measured similarly (series 2, image 13). No pleural effusion or pneumothorax.  Musculoskeletal: No chest wall mass. Unchanged posterior bridging fusion of T1-T6 across sclerotic lesions of T3 and T4, high-grade wedge deformity of T3. Sclerosis of the posterior right third fourth and fifth ribs underlying mass. CT ABDOMEN PELVIS FINDINGS Hepatobiliary: Multiple treated liver metastases are unchanged in appearance, for example a lesion of the liver dome, hepatic segment VIII, measuring 0.9 cm (series  2, image 60), and a lesion of the left lobe of the liver, hepatic segment II, measuring 0.9 cm (series 2, image 63). There is minimal, segmental biliary ductal dilatation of hepatic segment V, unchanged (series 2, image 69). No gallstones, gallbladder wall thickening, or central biliary dilatation. Pancreas: Unremarkable. No pancreatic ductal dilatation or surrounding inflammatory changes. Spleen: Normal in size without significant abnormality. Adrenals/Urinary Tract: Adrenal glands are unremarkable. Kidneys are normal, without renal calculi, solid lesion, or hydronephrosis. Bladder is unremarkable. Stomach/Bowel: Stomach is within normal limits. Appendix appears normal. No evidence of bowel wall thickening, distention, or inflammatory changes. Vascular/Lymphatic: Aortic atherosclerosis. No enlarged abdominal or pelvic lymph nodes. Reproductive: Prostatomegaly. Other: Status post right inguinal hernia repair. Unchanged subcutaneous inclusion cyst of the right groin, not previously PET avid (series 2, image 114). No ascites. Musculoskeletal: No acute osseous findings. IMPRESSION: 1. Significant interval decrease in size of a previously hypermetabolic soft tissue mass about the posterior right upper lobe, now difficult to discretely measure but no greater than 2.5 x 1.1 cm, previously 4.9 x 2.4 cm when measured similarly. Findings are consistent with treatment response recurrent lung malignancy. 2. Multiple treated liver metastases are unchanged in appearance, not previously PET avid. 3. Unchanged  posterior bridging fusion of T1-T6 across sclerotic lesions of T3 and T4, high-grade wedge deformity of T3. Sclerosis of the posterior right third fourth and fifth ribs underlying mass. Findings are consistent with treated osseous metastatic disease. 4. No evidence of new metastatic disease in the chest, abdomen, or pelvis. 5. Severe emphysema and diffuse bilateral bronchial wall thickening. 6. Prostatomegaly. 7. Coronary artery disease. Aortic Atherosclerosis (ICD10-I70.0) and Emphysema (ICD10-J43.9). Electronically Signed   By: Delanna Ahmadi M.D.   On: 09/07/2021 19:03     ASSESSMENT & PLAN:   Cancer of upper lobe of right lung ([place]) # Stage IV metastatic non-small cell lung cancer favor adeno; mets to bone; liver.  Currently on Keytruda maintenance-  Given the oligometastatic progression-s/p RT right upper lobe mass [until dec 15th, 2022]. FEB 9th, 2023-  Significant interval decrease in size of a previouslyvhypermetabolic soft tissue mass about the posterior right upperlobe; multiple treated liver metastases/bone metastases unchanged.  # proceed with [place] maintainance today. Labs today reviewed;  acceptable for treatment today.   # Bone metastases- on zometa 3 mg IVPB.  Every 6 weeks. Ca-9.1;Clinically- STABLE.   # left shoulder pain- [facility]- recommend NSAIDs/Topical. .Clinically-STABLE  #Debility-spinal cord compression/status post surgery-s/p physical therapy-. Clinically-STABLE  # Hypothyroidism-  Mid sep started on Synthroid low-dose 25 mcg once a day in the morning- JAN 2022-TSH 5.5.  Normal T3-T4.  Take 50 mcg a day; will recheck in 3  Weeks; STABLE.  # COPD-. On 2 lit O2; Trelegy.Clinically- STABLE.  #Incidental findings on Imaging CT FEB, 2023:Severe emphysema; Prostatomegaly; Coronary artery disease.   I reviewed/discussed/counseled the patient.     ZOMETA-? q 6W; TSH  # DISPOSITION:  #  Keytruda;  # follow up in 3 weeks- labs- cbc/cmp;Thyroid profile- Keytruda;Zometa today;  -Dr.B  # I reviewed the blood work- with the patient in detail; also reviewed the imaging independently [as summarized above]; and with the patient in detail.      .    All questions were answered. The patient knows to call the clinic with any problems, questions or concerns.    Cammie Sickle, MD 09/11/2021 9:09 AM

## 2021-09-16 ENCOUNTER — Other Ambulatory Visit: Payer: Self-pay | Admitting: Family Medicine

## 2021-09-16 DIAGNOSIS — F419 Anxiety disorder, unspecified: Secondary | ICD-10-CM

## 2021-09-16 DIAGNOSIS — F341 Dysthymic disorder: Secondary | ICD-10-CM

## 2021-09-16 DIAGNOSIS — G4700 Insomnia, unspecified: Secondary | ICD-10-CM

## 2021-09-29 DIAGNOSIS — J441 Chronic obstructive pulmonary disease with (acute) exacerbation: Secondary | ICD-10-CM | POA: Diagnosis not present

## 2021-09-29 DIAGNOSIS — J9601 Acute respiratory failure with hypoxia: Secondary | ICD-10-CM | POA: Diagnosis not present

## 2021-10-02 ENCOUNTER — Inpatient Hospital Stay (HOSPITAL_BASED_OUTPATIENT_CLINIC_OR_DEPARTMENT_OTHER): Payer: Medicare HMO | Admitting: Internal Medicine

## 2021-10-02 ENCOUNTER — Encounter: Payer: Self-pay | Admitting: Internal Medicine

## 2021-10-02 ENCOUNTER — Inpatient Hospital Stay: Payer: Medicare HMO | Attending: Internal Medicine

## 2021-10-02 ENCOUNTER — Inpatient Hospital Stay: Payer: Medicare HMO

## 2021-10-02 ENCOUNTER — Other Ambulatory Visit: Payer: Self-pay

## 2021-10-02 DIAGNOSIS — Z801 Family history of malignant neoplasm of trachea, bronchus and lung: Secondary | ICD-10-CM | POA: Diagnosis not present

## 2021-10-02 DIAGNOSIS — M25512 Pain in left shoulder: Secondary | ICD-10-CM | POA: Insufficient documentation

## 2021-10-02 DIAGNOSIS — R54 Age-related physical debility: Secondary | ICD-10-CM | POA: Insufficient documentation

## 2021-10-02 DIAGNOSIS — M549 Dorsalgia, unspecified: Secondary | ICD-10-CM | POA: Insufficient documentation

## 2021-10-02 DIAGNOSIS — R0602 Shortness of breath: Secondary | ICD-10-CM | POA: Diagnosis not present

## 2021-10-02 DIAGNOSIS — Z79899 Other long term (current) drug therapy: Secondary | ICD-10-CM | POA: Insufficient documentation

## 2021-10-02 DIAGNOSIS — I2510 Atherosclerotic heart disease of native coronary artery without angina pectoris: Secondary | ICD-10-CM | POA: Diagnosis not present

## 2021-10-02 DIAGNOSIS — Z8249 Family history of ischemic heart disease and other diseases of the circulatory system: Secondary | ICD-10-CM | POA: Diagnosis not present

## 2021-10-02 DIAGNOSIS — Z5112 Encounter for antineoplastic immunotherapy: Secondary | ICD-10-CM | POA: Diagnosis not present

## 2021-10-02 DIAGNOSIS — Z87891 Personal history of nicotine dependence: Secondary | ICD-10-CM | POA: Insufficient documentation

## 2021-10-02 DIAGNOSIS — C3411 Malignant neoplasm of upper lobe, right bronchus or lung: Secondary | ICD-10-CM | POA: Insufficient documentation

## 2021-10-02 DIAGNOSIS — J439 Emphysema, unspecified: Secondary | ICD-10-CM | POA: Diagnosis not present

## 2021-10-02 DIAGNOSIS — N400 Enlarged prostate without lower urinary tract symptoms: Secondary | ICD-10-CM | POA: Insufficient documentation

## 2021-10-02 DIAGNOSIS — J432 Centrilobular emphysema: Secondary | ICD-10-CM | POA: Insufficient documentation

## 2021-10-02 DIAGNOSIS — C787 Secondary malignant neoplasm of liver and intrahepatic bile duct: Secondary | ICD-10-CM | POA: Insufficient documentation

## 2021-10-02 DIAGNOSIS — E039 Hypothyroidism, unspecified: Secondary | ICD-10-CM | POA: Insufficient documentation

## 2021-10-02 DIAGNOSIS — Z823 Family history of stroke: Secondary | ICD-10-CM | POA: Diagnosis not present

## 2021-10-02 DIAGNOSIS — M2550 Pain in unspecified joint: Secondary | ICD-10-CM | POA: Diagnosis not present

## 2021-10-02 DIAGNOSIS — K838 Other specified diseases of biliary tract: Secondary | ICD-10-CM | POA: Diagnosis not present

## 2021-10-02 DIAGNOSIS — Z7189 Other specified counseling: Secondary | ICD-10-CM

## 2021-10-02 DIAGNOSIS — C7951 Secondary malignant neoplasm of bone: Secondary | ICD-10-CM | POA: Diagnosis not present

## 2021-10-02 DIAGNOSIS — R5383 Other fatigue: Secondary | ICD-10-CM | POA: Diagnosis not present

## 2021-10-02 LAB — CBC WITH DIFFERENTIAL/PLATELET
Abs Immature Granulocytes: 0.02 10*3/uL (ref 0.00–0.07)
Basophils Absolute: 0.1 10*3/uL (ref 0.0–0.1)
Basophils Relative: 1 %
Eosinophils Absolute: 0.7 10*3/uL — ABNORMAL HIGH (ref 0.0–0.5)
Eosinophils Relative: 8 %
HCT: 45.6 % (ref 39.0–52.0)
Hemoglobin: 14.6 g/dL (ref 13.0–17.0)
Immature Granulocytes: 0 %
Lymphocytes Relative: 16 %
Lymphs Abs: 1.3 10*3/uL (ref 0.7–4.0)
MCH: 29.8 pg (ref 26.0–34.0)
MCHC: 32 g/dL (ref 30.0–36.0)
MCV: 93.1 fL (ref 80.0–100.0)
Monocytes Absolute: 1 10*3/uL (ref 0.1–1.0)
Monocytes Relative: 12 %
Neutro Abs: 5.1 10*3/uL (ref 1.7–7.7)
Neutrophils Relative %: 63 %
Platelets: 176 10*3/uL (ref 150–400)
RBC: 4.9 MIL/uL (ref 4.22–5.81)
RDW: 14.6 % (ref 11.5–15.5)
WBC: 8.1 10*3/uL (ref 4.0–10.5)
nRBC: 0 % (ref 0.0–0.2)

## 2021-10-02 LAB — COMPREHENSIVE METABOLIC PANEL
ALT: 15 U/L (ref 0–44)
AST: 22 U/L (ref 15–41)
Albumin: 4 g/dL (ref 3.5–5.0)
Alkaline Phosphatase: 63 U/L (ref 38–126)
Anion gap: 6 (ref 5–15)
BUN: 12 mg/dL (ref 8–23)
CO2: 35 mmol/L — ABNORMAL HIGH (ref 22–32)
Calcium: 9.5 mg/dL (ref 8.9–10.3)
Chloride: 94 mmol/L — ABNORMAL LOW (ref 98–111)
Creatinine, Ser: 0.55 mg/dL — ABNORMAL LOW (ref 0.61–1.24)
GFR, Estimated: >60 mL/min (ref >60)
Glucose, Bld: 107 mg/dL — ABNORMAL HIGH (ref 70–99)
Potassium: 3.9 mmol/L (ref 3.5–5.1)
Sodium: 135 mmol/L (ref 135–145)
Total Bilirubin: 0.4 mg/dL (ref 0.3–1.2)
Total Protein: 7.3 g/dL (ref 6.5–8.1)

## 2021-10-02 MED ORDER — SODIUM CHLORIDE 0.9 % IV SOLN
200.0000 mg | Freq: Once | INTRAVENOUS | Status: AC
  Administered 2021-10-02: 200 mg via INTRAVENOUS
  Filled 2021-10-02: qty 8

## 2021-10-02 MED ORDER — HEPARIN SOD (PORK) LOCK FLUSH 100 UNIT/ML IV SOLN
INTRAVENOUS | Status: AC
  Filled 2021-10-02: qty 5

## 2021-10-02 MED ORDER — HEPARIN SOD (PORK) LOCK FLUSH 100 UNIT/ML IV SOLN
500.0000 [IU] | Freq: Once | INTRAVENOUS | Status: AC | PRN
  Administered 2021-10-02: 500 [IU]
  Filled 2021-10-02: qty 5

## 2021-10-02 MED ORDER — ZOLEDRONIC ACID 4 MG/5ML IV CONC
3.0000 mg | Freq: Once | INTRAVENOUS | Status: AC
  Administered 2021-10-02: 3 mg via INTRAVENOUS
  Filled 2021-10-02: qty 3.75

## 2021-10-02 MED ORDER — SODIUM CHLORIDE 0.9% FLUSH
10.0000 mL | Freq: Once | INTRAVENOUS | Status: AC
  Administered 2021-10-02: 10 mL via INTRAVENOUS
  Filled 2021-10-02: qty 10

## 2021-10-02 MED ORDER — SODIUM CHLORIDE 0.9 % IV SOLN
Freq: Once | INTRAVENOUS | Status: AC
  Filled 2021-10-02: qty 250

## 2021-10-02 NOTE — Patient Instructions (Signed)
[facility]  Discharge Instructions: ?Thank you for choosing [facility] to provide your oncology and hematology care.  ?If you have a lab appointment with the [facility], please go directly to the [facility] and check in at the registration area. ? ?Wear comfortable clothing and clothing appropriate for easy access to any Portacath or PICC line.  ? ?We strive to give you quality time with your provider. You may need to reschedule your appointment if you arrive late (15 or more minutes).  Arriving late affects you and other patients whose appointments are after yours.  Also, if you miss three or more appointments without notifying the office, you may be dismissed from the clinic at the provider?s discretion.    ?  ?For prescription refill requests, have your pharmacy contact our office and allow 72 hours for refills to be completed.   ? ?Today you received the following chemotherapy and/or immunotherapy agents keytruda ?  ?To help prevent nausea and vomiting after your treatment, we encourage you to take your nausea medication as directed. ? ?BELOW ARE SYMPTOMS THAT SHOULD BE REPORTED IMMEDIATELY: ?*FEVER GREATER THAN 100.4 F (38 ?C) OR HIGHER ?*CHILLS OR SWEATING ?*NAUSEA AND VOMITING THAT IS NOT CONTROLLED WITH YOUR NAUSEA MEDICATION ?*UNUSUAL SHORTNESS OF BREATH ?*UNUSUAL BRUISING OR BLEEDING ?*URINARY PROBLEMS (pain or burning when urinating, or frequent urination) ?*BOWEL PROBLEMS (unusual diarrhea, constipation, pain near the anus) ?TENDERNESS IN MOUTH AND THROAT WITH OR WITHOUT PRESENCE OF ULCERS (sore throat, sores in mouth, or a toothache) ?UNUSUAL RASH, SWELLING OR PAIN  ?UNUSUAL VAGINAL DISCHARGE OR ITCHING  ? ?Items with * indicate a potential emergency and should be followed up as soon as possible or go to the Emergency Department if any problems should occur. ? ?Please show the CHEMOTHERAPY ALERT CARD or IMMUNOTHERAPY ALERT CARD at check-in to the  Emergency Department and triage nurse. ? ?Should you have questions after your visit or need to cancel or reschedule your appointment, please contact [facility] [place] [facility] [place]  [phone] and follow the prompts.  Office hours are 8:00 a.m. to 4:30 p.m. Monday - Friday. Please note that voicemails left after 4:00 p.m. may not be returned until the following business day.  We are closed weekends and major holidays. You have access to a nurse at all times for urgent questions. Please call the main number to the clinic [phone] and follow the prompts. ? ?For any non-urgent questions, you may also contact your provider using MyChart. We now offer e-Visits for anyone 61 and older to request care online for non-urgent symptoms. For details visit [email or web address]. ?  ?Also download the MyChart app! Go to the app store, search "MyChart", open the app, select [facility], and log in with your MyChart username and password. ? ?Due to Covid, a mask is required upon entering the hospital/clinic. If you do not have a mask, one will be given to you upon arrival. For doctor visits, patients may have 1 support person aged 56 or older with them. For treatment visits, patients cannot have anyone with them due to current Covid guidelines and our immunocompromised population.  ?

## 2021-10-02 NOTE — Progress Notes (Signed)
Wants to talk about the dose of his levothyroxin. ?

## 2021-10-02 NOTE — Assessment & Plan Note (Addendum)
#   Stage IV metastatic non-small cell lung cancer favor adeno; mets to bone; liver.  Currently on Keytruda maintenance-  Given the oligometastatic progression-s/p RT right upper lobe mass [until dec 15th, 2022]. FEB 9th, 2023-  Significant interval decrease in size of a previouslyvhypermetabolic soft tissue mass about the posterior right upperlobe; multiple treated liver metastases/bone metastases unchanged.STABLE.  ? ?# proceed with [place] maintainance today. Labs today reviewed;  acceptable for treatment today.  ? ?# Bone metastases- on zometa 3 mg IVPB.  Every 6 weeks. Ca-9.5;Clinically- STABLE.  ? ?# left shoulder pain- [facility]- recommend NSAIDs/Topical. .Clinically-STABLE ? ?#Debility-spinal cord compression/status post surgery-s/p physical therapy-. Clinically-STABLE ? ?# Hypothyroidism- [ Mid sep 2022] JAN 2022-TSH 5.5.  Normal T3-T4.  Currently on 50 mcg a day; await labs today- STABLE. ? ?# COPD-. On 2 lit O2; Trelegy.Clinically- STABLE. ? ?#Incidental findings on Imaging CT FEB, 2023:Severe emphysema; Prostatomegaly; Coronary artery disease. ?? I reviewed/discussed/counseled the patient.  ? ?  ZOMETA-? q 6W; TSH ? ?# DISPOSITION:  ?#  Keytruda; Zometa ?# follow up in 3 weeks- labs- cbc/cmp; Keytruda; -Dr.B ? ? ? ? ?.  ? ?

## 2021-10-02 NOTE — Progress Notes (Signed)
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   # 2021- MAY 26th-Keytrda x2 cycles; [borderline PS] July 7th-carbo Alimta Keytruda cycle #1  #November 2022-oligometastatic progression-right upper lobe subpleural involvement with; s/p radiation finished December 15.  Temporarily held [place].  #Jan fourth 2023-restarted Keytruda.  # NGS/MOLECULAR TESTS:   # PALLIATIVE CARE EVALUATION:  # PAIN MANAGEMENT:    DIAGNOSIS:   STAGE:         ;  GOALS:  CURRENT/MOST RECENT THERAPY :     Cancer of upper lobe of right lung ([facility])  11/23/2019 Initial Diagnosis   Cancer of upper lobe of right lung ([place])   12/21/2019 -  Chemotherapy   Patient is on Treatment Plan : LUNG CARBOplatin / Pemetrexed / Pembrolizumab q21d Induction x 4 cycles / Maintenance Pemetrexed + Pembrolizumab      HISTORY OF PRESENTING ILLNESS: Thin built frail appearing male patient.  No acute distress.  Alone.   Nasal cannula oxygen.  Cody [name] 66 y.o.  male patient with metastatic non-small cell lung cancer; cord compression status post decompressive surgery; currently on Keytruda maintenance-is here for follow-up.  Patient denies any nausea vomiting.  Any fevers or chills.  Chronic mild fatigue.  Denies any  worsening shortness of breath or cough.  No diarrhea.  No skin rash.  Review of Systems  Constitutional:  Positive for malaise/fatigue. Negative for chills, diaphoresis and fever.  HENT:  Negative for nosebleeds and sore throat.   Eyes:  Negative for double vision.  Respiratory:  Positive for shortness of breath. Negative for cough, hemoptysis, sputum production and wheezing.   Cardiovascular:  Negative for chest pain, palpitations, orthopnea and leg swelling.  Gastrointestinal:  Negative for abdominal pain, blood in stool, constipation, heartburn, melena, nausea and vomiting.  Genitourinary:  Negative for dysuria, frequency and urgency.  Musculoskeletal:  Positive for back pain and joint pain.  Skin: Negative.  Negative for itching and rash.  Neurological:  Negative for dizziness, tingling, focal weakness, weakness and headaches.  Endo/Heme/Allergies:  Does not bruise/bleed easily.  Psychiatric/Behavioral:  Negative for depression. The patient is not nervous/anxious and does not have insomnia.     MEDICAL HISTORY:  Past Medical History:  Diagnosis Date   Allergy    Cancer of upper lobe of right lung ([place]) 10/2019   COPD (chronic obstructive pulmonary disease) (HCC)    Prostate enlargement     SURGICAL HISTORY: Past Surgical History:  Procedure Laterality Date   HERNIA REPAIR Bilateral 1982   HERNIA REPAIR Right 1994   IR IMAGING GUIDED PORT INSERTION  02/24/2020   LAMINECTOMY FOR EXCISION / EVACUATION INTRASPINAL LESION  11/07/2019   T1-T 6 at [facility]     SOCIAL HISTORY: Social History   Socioeconomic History   Marital status: Married    Spouse name: [name]    Number of children: 4   Years of education: Not  on file   Highest education level: Some college, no degree  Occupational History   Occupation: dispatch and delivery   Tobacco Use   Smoking status: Former    Packs/day: 2.00    Years: 30.00    Pack years: 60.00    Types: Cigarettes    Quit date: 07/29/2011    Years  since quitting: 10.1   Smokeless tobacco: Never   Tobacco comments:    pt vapes  Vaping Use   Vaping Use: Not on file  Substance and Sexual Activity   Alcohol use: Yes    Alcohol/week: 5.0 standard drinks    Types: 5 Standard drinks or equivalent per week    Comment: 1-2/day   Drug use: No   Sexual activity: Not Currently    Partners: Female  Other Topics Concern   Not on file  Social History Narrative   Worked in warehouse/ quit smoking in 2015; beer 1/day; in [place]; with wife.    Social Determinants of Health   Financial Resource Strain: Not on file  Food Insecurity: Not on file  Transportation Needs: Not on file  Physical Activity: Not on file  Stress: Not on file  Social Connections: Not on file  Intimate Partner Violence: Not on file    FAMILY HISTORY: Family History  Problem Relation Age of Onset   Heart disease Father    CVA Mother    Lung cancer Sister    Lung cancer Brother     ALLERGIES:  has No Known Allergies.  MEDICATIONS:  Current Outpatient Medications  Medication Sig Dispense Refill   acetaminophen (TYLENOL) 500 MG tablet Take 500-1,000 mg by mouth every 6 (six) hours as needed for mild pain or fever.      albuterol (VENTOLIN HFA) 108 (90 Base) MCG/ACT inhaler Inhale 2 puffs into the lungs every 4 (four) hours as needed for wheezing or shortness of breath. 18 g 0   Calcium Carb-Cholecalciferol (OYSTER SHELL CALCIUM) 500-400 MG-UNIT TABS Take 500 tablets by mouth.     escitalopram (LEXAPRO) 5 MG tablet TAKE ONE TABLET BY MOUTH EVERY MORNING 90 tablet 0   Fluticasone-Umeclidin-Vilant (TRELEGY ELLIPTA) 100-62.5-25 MCG/INH AEPB Inhale 1 puff into the lungs daily. 180 each 1   levothyroxine (SYNTHROID) 25 MCG tablet Take 1 tablet (25 mcg total) by mouth daily before breakfast. 90 tablet 1   lidocaine-prilocaine (EMLA) cream Apply 1 application topically as needed. Apply small amount to port site at least 1 hour prior to it being accessed, cover with  plastic wrap 30 g 1   ondansetron (ZOFRAN) 4 MG tablet Take 1 tablet (4 mg total) by mouth every 8 (eight) hours as needed for nausea or vomiting. 20 tablet 2   prochlorperazine (COMPAZINE) 10 MG tablet Take 1 tablet (10 mg total) by mouth every 6 (six) hours as needed for nausea or vomiting. 40 tablet 1   QUEtiapine (SEROQUEL) 25 MG tablet TAKE ONE TABLET BY MOUTH EVERY NIGHT AT BEDTIME 90 tablet 0   No current facility-administered medications for this visit.      Marland Kitchen  PHYSICAL EXAMINATION: ECOG PERFORMANCE STATUS: 3 - Symptomatic, >50% confined to bed  Vitals:   10/02/21 0837  BP: (!) 156/85  Pulse: 86  Temp: (!) 96.2 F (35.7 C)  SpO2: 100%   Filed Weights   10/02/21 0837  Weight: 128 lb (58.1 kg)    Physical Exam HENT:     Head: Normocephalic and atraumatic.     Mouth/Throat:     Pharynx:  No oropharyngeal exudate.  Eyes:     Pupils: Pupils are equal, round, and reactive to light.  Cardiovascular:     Rate and Rhythm: Normal rate and regular rhythm.  Pulmonary:     Effort: No respiratory distress.     Breath sounds: No wheezing.     Comments: Decreased air entry bilaterally. Abdominal:     General: Bowel sounds are normal. There is no distension.     Palpations: Abdomen is soft. There is no mass.     Tenderness: There is no abdominal tenderness. There is no guarding or rebound.  Musculoskeletal:        General: No tenderness. Normal range of motion.     Cervical back: Normal range of motion and neck supple.  Skin:    General: Skin is warm.  Neurological:     Mental Status: He is alert and oriented to person, place, and time.  Psychiatric:        Mood and Affect: Affect normal.     LABORATORY DATA:  I have reviewed the data as listed Lab Results  Component Value Date   WBC 8.1 10/02/2021   HGB 14.6 10/02/2021   HCT 45.6 10/02/2021   MCV 93.1 10/02/2021   PLT 176 10/02/2021   Recent Labs    08/21/21 0844 09/11/21 0826 10/02/21 0825  NA 135 136  135  K 4.3 4.1 3.9  CL 91* 94* 94*  CO2 38* 37* 35*  GLUCOSE 107* 108* 107*  BUN _0 CREATININE 0.53* 0.54* 0.55*  CALCIUM 9.3 9.2 9.5  GFRNONAA >60 >60 >60  PROT 7.8 7.8 7.3  ALBUMIN 4.0 4.1 4.0  AST _1 ALT _2 ALKPHOS 73 67 63  BILITOT 0.5 0.4 0.4    RADIOGRAPHIC STUDIES: I have personally reviewed the radiological images as listed and agreed with the findings in the report. CT CHEST ABDOMEN PELVIS W CONTRAST  Result Date: 09/07/2021 CLINICAL DATA:  Metastatic non-small cell lung cancer restaging, history of chemotherapy and radiation, ongoing immunotherapy EXAM: CT CHEST, ABDOMEN, AND PELVIS WITH CONTRAST TECHNIQUE: Multidetector CT imaging of the chest, abdomen and pelvis was performed following the standard protocol during bolus administration of intravenous contrast. RADIATION DOSE REDUCTION: This exam was performed according to the departmental dose-optimization program which includes automated exposure control, adjustment of the mA and/or kV according to patient size and/or use of iterative reconstruction technique. CONTRAST:  115m OMNIPAQUE IOHEXOL 300 MG/ML SOLN, additional oral enteric contrast COMPARISON:  PET-CT, 05/28/2021, CT chest abdomen pelvis, 05/17/2021 FINDINGS: CT CHEST FINDINGS Cardiovascular: Right chest port catheter. Aortic atherosclerosis. Normal heart size. Left and right coronary artery calcifications. No pericardial effusion. Mediastinum/Nodes: No enlarged mediastinal, hilar, or axillary lymph nodes. Thyroid gland, trachea, and esophagus demonstrate no significant findings. Lungs/Pleura: Severe centrilobular and paraseptal emphysema. Diffuse bilateral bronchial wall thickening. There has been significant interval decrease in size of a hypermetabolic soft tissue mass about the posterior right upper lobe, now difficult to discretely measure but no greater than 2.5 x 1.1 cm, previously 4.9 x 2.4 cm when measured similarly (series 2, image 13). No  pleural effusion or pneumothorax. Musculoskeletal: No chest wall mass. Unchanged posterior bridging fusion of T1-T6 across sclerotic lesions of T3 and T4, high-grade wedge deformity of T3. Sclerosis of the posterior right third fourth and fifth ribs underlying mass. CT ABDOMEN PELVIS FINDINGS Hepatobiliary: Multiple treated liver metastases are unchanged in appearance, for example a lesion of the liver dome, hepatic segment VIII,  measuring 0.9 cm (series 2, image 60), and a lesion of the left lobe of the liver, hepatic segment II, measuring 0.9 cm (series 2, image 63). There is minimal, segmental biliary ductal dilatation of hepatic segment V, unchanged (series 2, image 69). No gallstones, gallbladder wall thickening, or central biliary dilatation. Pancreas: Unremarkable. No pancreatic ductal dilatation or surrounding inflammatory changes. Spleen: Normal in size without significant abnormality. Adrenals/Urinary Tract: Adrenal glands are unremarkable. Kidneys are normal, without renal calculi, solid lesion, or hydronephrosis. Bladder is unremarkable. Stomach/Bowel: Stomach is within normal limits. Appendix appears normal. No evidence of bowel wall thickening, distention, or inflammatory changes. Vascular/Lymphatic: Aortic atherosclerosis. No enlarged abdominal or pelvic lymph nodes. Reproductive: Prostatomegaly. Other: Status post right inguinal hernia repair. Unchanged subcutaneous inclusion cyst of the right groin, not previously PET avid (series 2, image 114). No ascites. Musculoskeletal: No acute osseous findings. IMPRESSION: 1. Significant interval decrease in size of a previously hypermetabolic soft tissue mass about the posterior right upper lobe, now difficult to discretely measure but no greater than 2.5 x 1.1 cm, previously 4.9 x 2.4 cm when measured similarly. Findings are consistent with treatment response recurrent lung malignancy. 2. Multiple treated liver metastases are unchanged in appearance, not  previously PET avid. 3. Unchanged posterior bridging fusion of T1-T6 across sclerotic lesions of T3 and T4, high-grade wedge deformity of T3. Sclerosis of the posterior right third fourth and fifth ribs underlying mass. Findings are consistent with treated osseous metastatic disease. 4. No evidence of new metastatic disease in the chest, abdomen, or pelvis. 5. Severe emphysema and diffuse bilateral bronchial wall thickening. 6. Prostatomegaly. 7. Coronary artery disease. Aortic Atherosclerosis (ICD10-I70.0) and Emphysema (ICD10-J43.9). Electronically Signed   By: Delanna Ahmadi M.D.   On: 09/07/2021 19:03     ASSESSMENT & PLAN:   Cancer of upper lobe of right lung ([facility] [place]) # Stage IV metastatic non-small cell lung cancer favor adeno; mets to bone; liver.  Currently on Keytruda maintenance-  Given the oligometastatic progression-s/p RT right upper lobe mass [until dec 15th, 2022]. FEB 9th, 2023-  Significant interval decrease in size of a previouslyvhypermetabolic soft tissue mass about the posterior right upperlobe; multiple treated liver metastases/bone metastases unchanged.STABLE.   # proceed with [place] maintainance today. Labs today reviewed;  acceptable for treatment today.   # Bone metastases- on zometa 3 mg IVPB.  Every 6 weeks. Ca-9.5;Clinically- STABLE.   # left shoulder pain- [facility]- recommend NSAIDs/Topical. .Clinically-STABLE  #Debility-spinal cord compression/status post surgery-s/p physical therapy-. Clinically-STABLE  # Hypothyroidism- [ Mid sep 2022] JAN 2022-TSH 5.5.  Normal T3-T4.  Currently on 50 mcg a day; await labs today- STABLE.  # COPD-. On 2 lit O2; Trelegy.Clinically- STABLE.  #Incidental findings on Imaging CT FEB, 2023:Severe emphysema; Prostatomegaly; Coronary artery disease.   I reviewed/discussed/counseled the patient.     ZOMETA-? q 6W; TSH  # DISPOSITION:  #  Keytruda; Zometa # follow up in 3 weeks- labs- cbc/cmp; Keytruda; -Dr.B     .    All questions  were answered. The patient knows to call the clinic with any problems, questions or concerns.    Cammie Sickle, MD 10/02/2021 8:55 AM

## 2021-10-03 LAB — THYROID PANEL WITH TSH
Free Thyroxine Index: 2.2 (ref 1.2–4.9)
T3 Uptake Ratio: 28 % (ref 24–39)
T4, Total: 7.8 ug/dL (ref 4.5–12.0)
TSH: 5.44 u[IU]/mL — ABNORMAL HIGH (ref 0.450–4.500)

## 2021-10-07 ENCOUNTER — Encounter: Payer: Self-pay | Admitting: Internal Medicine

## 2021-10-07 MED ORDER — LEVOTHYROXINE SODIUM 50 MCG PO TABS: 50.0000 ug | ORAL_TABLET | Freq: Every day | ORAL | 0 refills | Status: DC

## 2021-10-07 NOTE — Telephone Encounter (Signed)
Called patient to inform him that Dr. B would like him to continue the current dose of Levothyroxine 2mcg QD, rx for 90 day supply sent to [facility].  Patient understands that the new rx is for Levothyroxine being 50 mcg he will take 1 QD. ?

## 2021-10-13 ENCOUNTER — Other Ambulatory Visit: Payer: Self-pay | Admitting: Internal Medicine

## 2021-10-23 ENCOUNTER — Inpatient Hospital Stay: Payer: Medicare HMO | Admitting: Nurse Practitioner

## 2021-10-23 ENCOUNTER — Other Ambulatory Visit: Payer: Self-pay

## 2021-10-23 ENCOUNTER — Encounter: Payer: Self-pay | Admitting: Nurse Practitioner

## 2021-10-23 ENCOUNTER — Inpatient Hospital Stay: Payer: Medicare HMO

## 2021-10-23 VITALS — BP 148/85 | HR 85 | Temp 97.5°F | Resp 16 | Ht 69.0 in | Wt 127.9 lb

## 2021-10-23 DIAGNOSIS — N400 Enlarged prostate without lower urinary tract symptoms: Secondary | ICD-10-CM | POA: Diagnosis not present

## 2021-10-23 DIAGNOSIS — C787 Secondary malignant neoplasm of liver and intrahepatic bile duct: Secondary | ICD-10-CM | POA: Diagnosis not present

## 2021-10-23 DIAGNOSIS — C7951 Secondary malignant neoplasm of bone: Secondary | ICD-10-CM

## 2021-10-23 DIAGNOSIS — C3411 Malignant neoplasm of upper lobe, right bronchus or lung: Secondary | ICD-10-CM

## 2021-10-23 DIAGNOSIS — R0602 Shortness of breath: Secondary | ICD-10-CM | POA: Diagnosis not present

## 2021-10-23 DIAGNOSIS — M2550 Pain in unspecified joint: Secondary | ICD-10-CM | POA: Diagnosis not present

## 2021-10-23 DIAGNOSIS — J432 Centrilobular emphysema: Secondary | ICD-10-CM | POA: Diagnosis not present

## 2021-10-23 DIAGNOSIS — R54 Age-related physical debility: Secondary | ICD-10-CM | POA: Diagnosis not present

## 2021-10-23 DIAGNOSIS — M549 Dorsalgia, unspecified: Secondary | ICD-10-CM | POA: Diagnosis not present

## 2021-10-23 DIAGNOSIS — Z7189 Other specified counseling: Secondary | ICD-10-CM

## 2021-10-23 DIAGNOSIS — Z5112 Encounter for antineoplastic immunotherapy: Secondary | ICD-10-CM

## 2021-10-23 LAB — CBC WITH DIFFERENTIAL/PLATELET
Abs Immature Granulocytes: 0.01 10*3/uL (ref 0.00–0.07)
Basophils Absolute: 0.1 10*3/uL (ref 0.0–0.1)
Basophils Relative: 1 %
Eosinophils Absolute: 0.5 10*3/uL (ref 0.0–0.5)
Eosinophils Relative: 9 %
HCT: 46.5 % (ref 39.0–52.0)
Hemoglobin: 14.6 g/dL (ref 13.0–17.0)
Immature Granulocytes: 0 %
Lymphocytes Relative: 18 %
Lymphs Abs: 1 10*3/uL (ref 0.7–4.0)
MCH: 30 pg (ref 26.0–34.0)
MCHC: 31.4 g/dL (ref 30.0–36.0)
MCV: 95.5 fL (ref 80.0–100.0)
Monocytes Absolute: 0.6 10*3/uL (ref 0.1–1.0)
Monocytes Relative: 11 %
Neutro Abs: 3.4 10*3/uL (ref 1.7–7.7)
Neutrophils Relative %: 61 %
Platelets: 162 10*3/uL (ref 150–400)
RBC: 4.87 MIL/uL (ref 4.22–5.81)
RDW: 13.6 % (ref 11.5–15.5)
WBC: 5.6 10*3/uL (ref 4.0–10.5)
nRBC: 0 % (ref 0.0–0.2)

## 2021-10-23 LAB — COMPREHENSIVE METABOLIC PANEL
ALT: 15 U/L (ref 0–44)
AST: 24 U/L (ref 15–41)
Albumin: 3.9 g/dL (ref 3.5–5.0)
Alkaline Phosphatase: 54 U/L (ref 38–126)
Anion gap: 9 (ref 5–15)
BUN: 12 mg/dL (ref 8–23)
CO2: 33 mmol/L — ABNORMAL HIGH (ref 22–32)
Calcium: 8.9 mg/dL (ref 8.9–10.3)
Chloride: 96 mmol/L — ABNORMAL LOW (ref 98–111)
Creatinine, Ser: 0.45 mg/dL — ABNORMAL LOW (ref 0.61–1.24)
GFR, Estimated: >60 mL/min (ref >60)
Glucose, Bld: 101 mg/dL — ABNORMAL HIGH (ref 70–99)
Potassium: 4.2 mmol/L (ref 3.5–5.1)
Sodium: 138 mmol/L (ref 135–145)
Total Bilirubin: 0.7 mg/dL (ref 0.3–1.2)
Total Protein: 7.5 g/dL (ref 6.5–8.1)

## 2021-10-23 MED ORDER — SODIUM CHLORIDE 0.9 % IV SOLN
200.0000 mg | Freq: Once | INTRAVENOUS | Status: AC
  Administered 2021-10-23: 200 mg via INTRAVENOUS
  Filled 2021-10-23: qty 200

## 2021-10-23 MED ORDER — SODIUM CHLORIDE 0.9 % IV SOLN
Freq: Once | INTRAVENOUS | Status: AC
  Filled 2021-10-23: qty 250

## 2021-10-23 MED ORDER — HEPARIN SOD (PORK) LOCK FLUSH 100 UNIT/ML IV SOLN
500.0000 [IU] | Freq: Once | INTRAVENOUS | Status: AC | PRN
  Administered 2021-10-23: 500 [IU]
  Filled 2021-10-23: qty 5

## 2021-10-23 MED ORDER — SODIUM CHLORIDE 0.9% FLUSH
10.0000 mL | Freq: Once | INTRAVENOUS | Status: AC
  Administered 2021-10-23: 10 mL via INTRAVENOUS
  Filled 2021-10-23: qty 10

## 2021-10-23 NOTE — Progress Notes (Signed)
[facility] ?CONSULT NOTE ? ?Patient Care Team: ?Steele Sizer, MD as PCP - General (Family Medicine) ?Christene Lye, MD (General Surgery) ?Telford Nab, RN as Sales executive ?Cammie Sickle, MD as Consulting Physician (Hematology and Oncology) ? ?CHIEF COMPLAINTS/PURPOSE OF CONSULTATION: Lung cancer ? ?#  ?Oncology History Overview Note  ?# April 2021- RUL lung cancer; liver met; spinal cord compression/ vertebral mets [[facility]]; [[place]]; LIVER Bx- Metastatic adenocarcinoma, consistent with lung primary.- TPS % Interpretation -PD-L1 IHC 10 LOW EXPRESSION POSITIVE  ? 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  ? ?# 2021- MAY 26th-Keytrda x2 cycles; [borderline PS] July 7th-carbo Alimta Keytruda cycle #1 ? ?#November 2022-oligometastatic progression-right upper lobe subpleural involvement with; s/p radiation finished December 15.  Temporarily held [place]. ? ?#Jan fourth 2023-restarted Keytruda. ? ?# NGS/MOLECULAR TESTS: ? ? ?# PALLIATIVE CARE EVALUATION: ? ?# PAIN MANAGEMENT:  ? ? ?DIAGNOSIS:  ? ?STAGE:         ;  GOALS: ? ?CURRENT/MOST RECENT THERAPY :  ? ?  ?Cancer of upper lobe of right lung ([place])  ?11/23/2019 Initial Diagnosis  ? Cancer of upper lobe of right lung ([facility]) ?  ?12/21/2019 -  Chemotherapy  ? Patient is on Treatment Plan : LUNG CARBOplatin / Pemetrexed / Pembrolizumab q21d Induction x 4 cycles / Maintenance Pemetrexed + Pembrolizumab  ?   ? ?HISTORY OF PRESENTING ILLNESS: Thin built frail appearing male patient.  No acute distress. Alone. Nasal cannula oxygen. ? ?Cody [name] 66 y.o.  male patient with metastatic non-small cell lung cancer; cord compression status post decompressive surgery; currently on Keytruda maintenance-is here for follow-up, and consideration of [place].  ? ?Feels at baseline. Persistent mild fatigue which is unchanged. No  rashes, worsening shortness of breath, cough. No diarrhea, abdominal pain. No fevers, chills, interval infections. No nausea or vomiting. No new aches or pains.  ? ?Review of Systems  ?Constitutional:  Positive for malaise/fatigue. Negative for chills, diaphoresis and fever.  ?HENT:  Negative for nosebleeds and sore throat.   ?Eyes:  Negative for double vision.  ?Respiratory:  Positive for shortness of breath. Negative for cough, hemoptysis, sputum production and wheezing.   ?Cardiovascular:  Negative for chest pain, palpitations, orthopnea and leg swelling.  ?Gastrointestinal:  Negative for abdominal pain, blood in stool, constipation, heartburn, melena, nausea and vomiting.  ?Genitourinary:  Negative for dysuria, frequency and urgency.  ?Musculoskeletal:  Positive for back pain and joint pain. Negative for falls.  ?Skin: Negative.  Negative for itching and rash.  ?Neurological:  Negative for dizziness, tingling, focal weakness, weakness and headaches.  ?Endo/Heme/Allergies:  Does not bruise/bleed easily.  ?Psychiatric/Behavioral:  Negative for depression. The patient is not nervous/anxious and does not have insomnia.    ? ?MEDICAL HISTORY:  ?Past Medical History:  ?Diagnosis Date  ? Allergy   ? Cancer of upper lobe of right lung ([place]) 10/2019  ? COPD (chronic obstructive pulmonary disease) ([place])   ? Prostate enlargement   ? ?SURGICAL HISTORY: ?Past Surgical History:  ?Procedure Laterality Date  ? HERNIA REPAIR Bilateral 1982  ? HERNIA REPAIR Right 1994  ? IR IMAGING GUIDED PORT INSERTION  02/24/2020  ? LAMINECTOMY FOR EXCISION / EVACUATION INTRASPINAL LESION  11/07/2019  ? T1-T 6 at [facility]   ? ?SOCIAL HISTORY: ?Social History  ? ?Socioeconomic History  ? Marital status: Married  ?  Spouse name: Cody [name]   ? Number of  children: 4  ? Years of education: Not on file  ? Highest education level: Some college, no degree  ?Occupational History  ? Occupation: dispatch and delivery   ?Tobacco Use  ? Smoking status: Former  ?   Packs/day: 2.00  ?  Years: 30.00  ?  Pack years: 60.00  ?  Types: Cigarettes  ?  Quit date: 07/29/2011  ?  Years since quitting: 10.2  ? Smokeless tobacco: Never  ? Tobacco comments:  ?  pt vapes  ?Vaping Use  ? Vaping Use: Not on file  ?Substance and Sexual Activity  ? Alcohol use: Yes  ?  Alcohol/week: 5.0 standard drinks  ?  Types: 5 Standard drinks or equivalent per week  ?  Comment: 1-2/day  ? Drug use: No  ? Sexual activity: Not Currently  ?  Partners: Female  ?Other Topics Concern  ? Not on file  ?Social History Narrative  ? Worked in warehouse/ quit smoking in 2015; beer 1/day; in [place]; with wife.   ? ?Social Determinants of Health  ? ?Financial Resource Strain: Not on file  ?Food Insecurity: Not on file  ?Transportation Needs: Not on file  ?Physical Activity: Not on file  ?Stress: Not on file  ?Social Connections: Not on file  ?Intimate Partner Violence: Not on file  ? ?FAMILY HISTORY: ?Family History  ?Problem Relation Age of Onset  ? Heart disease Father   ? CVA Mother   ? Lung cancer Sister   ? Lung cancer Brother   ? ?ALLERGIES:  has No Known Allergies. ? ?MEDICATIONS:  ?Current Outpatient Medications  ?Medication Sig Dispense Refill  ? acetaminophen (TYLENOL) 500 MG tablet Take 500-1,000 mg by mouth every 6 (six) hours as needed for mild pain or fever.     ? albuterol (VENTOLIN HFA) 108 (90 Base) MCG/ACT inhaler Inhale 2 puffs into the lungs every 4 (four) hours as needed for wheezing or shortness of breath. 18 g 0  ? Calcium Carb-Cholecalciferol (OYSTER SHELL CALCIUM) 500-400 MG-UNIT TABS Take 500 tablets by mouth.    ? escitalopram (LEXAPRO) 5 MG tablet TAKE ONE TABLET BY MOUTH EVERY MORNING 90 tablet 0  ? Fluticasone-Umeclidin-Vilant (TRELEGY ELLIPTA) 100-62.5-25 MCG/INH AEPB Inhale 1 puff into the lungs daily. 180 each 1  ? levothyroxine (SYNTHROID) 50 MCG tablet Take 1 tablet (50 mcg total) by mouth daily before breakfast. 90 tablet 0  ? lidocaine-prilocaine (EMLA) cream Apply 1 application  topically as needed. Apply small amount to port site at least 1 hour prior to it being accessed, cover with plastic wrap 30 g 1  ? ondansetron (ZOFRAN) 4 MG tablet Take 1 tablet (4 mg total) by mouth every 8 (eight) hours as needed for nausea or vomiting. 20 tablet 2  ? prochlorperazine (COMPAZINE) 10 MG tablet Take 1 tablet (10 mg total) by mouth every 6 (six) hours as needed for nausea or vomiting. 40 tablet 1  ? QUEtiapine (SEROQUEL) 25 MG tablet TAKE ONE TABLET BY MOUTH EVERY NIGHT AT BEDTIME 90 tablet 0  ? ?No current facility-administered medications for this visit.  ? ?PHYSICAL EXAMINATION: ?ECOG PERFORMANCE STATUS: 3 - Symptomatic, >50% confined to bed ? ?Vitals:  ? 10/23/21 0855  ?BP: (!) 148/85  ?Pulse: 85  ?Resp: 16  ?Temp: (!) 97.5 ?F (36.4 ?C)  ?SpO2: 98%  ? ?Filed Weights  ? 10/23/21 0855  ?Weight: 127 lb 14.4 oz (58 kg)  ? ?Physical Exam ?Vitals and nursing note reviewed.  ?Constitutional:   ?   Appearance: He  is not ill-appearing.  ?   Comments: Frail appearing  ?HENT:  ?   Head: Normocephalic and atraumatic.  ?   Mouth/Throat:  ?   Pharynx: No oropharyngeal exudate.  ?Cardiovascular:  ?   Rate and Rhythm: Normal rate and regular rhythm.  ?Pulmonary:  ?   Effort: No respiratory distress.  ?   Breath sounds: No wheezing.  ?   Comments: Decreased air entry bilaterally. ?Abdominal:  ?   General: There is no distension.  ?   Palpations: Abdomen is soft. There is no mass.  ?   Tenderness: There is no abdominal tenderness. There is no guarding or rebound.  ?Musculoskeletal:     ?   General: No tenderness or deformity.  ?Lymphadenopathy:  ?   Cervical: No cervical adenopathy.  ?Skin: ?   General: Skin is warm and dry.  ?Neurological:  ?   Mental Status: He is alert and oriented to person, place, and time.  ?Psychiatric:     ?   Mood and Affect: Mood and affect normal.     ?   Behavior: Behavior normal.  ? ? ? ?LABORATORY DATA:  ?I have reviewed the data as listed ?Lab Results  ?Component Value Date  ? WBC  5.6 10/23/2021  ? HGB 14.6 10/23/2021  ? HCT 46.5 10/23/2021  ? MCV 95.5 10/23/2021  ? PLT 162 10/23/2021  ? ? ?Recent Labs  ?  09/11/21 ?0826 10/02/21 ?0825 10/23/21 ?[id number]  ?NA 136 135 138  ?K 4.1 3.9 4.2  ?CL

## 2021-10-23 NOTE — Progress Notes (Signed)
Patient here for pre treatment check no complaints today. ?

## 2021-10-23 NOTE — Patient Instructions (Signed)
[facility] CANCER CTR AT [place]  Discharge Instructions: ?Thank you for choosing [facility] to provide your oncology and hematology care.  ?If you have a lab appointment with the [facility], please go directly to the [facility] and check in at the registration area. ? ?Wear comfortable clothing and clothing appropriate for easy access to any Portacath or PICC line.  ? ?We strive to give you quality time with your provider. You may need to reschedule your appointment if you arrive late (15 or more minutes).  Arriving late affects you and other patients whose appointments are after yours.  Also, if you miss three or more appointments without notifying the office, you may be dismissed from the clinic at the provider?s discretion.    ?  ?For prescription refill requests, have your pharmacy contact our office and allow 72 hours for refills to be completed.   ? ?Today you received the following chemotherapy and/or immunotherapy agents: Keytruda. ?  ?To help prevent nausea and vomiting after your treatment, we encourage you to take your nausea medication as directed. ? ?BELOW ARE SYMPTOMS THAT SHOULD BE REPORTED IMMEDIATELY: ?*FEVER GREATER THAN 100.4 F (38 ?C) OR HIGHER ?*CHILLS OR SWEATING ?*NAUSEA AND VOMITING THAT IS NOT CONTROLLED WITH YOUR NAUSEA MEDICATION ?*UNUSUAL SHORTNESS OF BREATH ?*UNUSUAL BRUISING OR BLEEDING ?*URINARY PROBLEMS (pain or burning when urinating, or frequent urination) ?*BOWEL PROBLEMS (unusual diarrhea, constipation, pain near the anus) ?TENDERNESS IN MOUTH AND THROAT WITH OR WITHOUT PRESENCE OF ULCERS (sore throat, sores in mouth, or a toothache) ?UNUSUAL RASH, SWELLING OR PAIN  ?UNUSUAL VAGINAL DISCHARGE OR ITCHING  ? ?Items with * indicate a potential emergency and should be followed up as soon as possible or go to the Emergency Department if any problems should occur. ? ?Please show the CHEMOTHERAPY ALERT CARD or IMMUNOTHERAPY ALERT CARD at check-in to the  Emergency Department and triage nurse. ? ?Should you have questions after your visit or need to cancel or reschedule your appointment, please contact [facility] CANCER [place] AT [place]  [phone] and follow the prompts.  Office hours are 8:00 a.m. to 4:30 p.m. Monday - Friday. Please note that voicemails left after 4:00 p.m. may not be returned until the following business day.  We are closed weekends and major holidays. You have access to a nurse at all times for urgent questions. Please call the main number to the clinic [phone] and follow the prompts. ? ?For any non-urgent questions, you may also contact your provider using MyChart. We now offer e-Visits for anyone 17 and older to request care online for non-urgent symptoms. For details visit [email or web address]. ?  ?Also download the MyChart app! Go to the app store, search "MyChart", open the app, select [facility], and log in with your MyChart username and password. ? ?Due to Covid, a mask is required upon entering the hospital/clinic. If you do not have a mask, one will be given to you upon arrival. For doctor visits, patients may have 1 support person aged 68 or older with them. For treatment visits, patients cannot have anyone with them due to current Covid guidelines and our immunocompromised population.  ?

## 2021-10-30 DIAGNOSIS — J9601 Acute respiratory failure with hypoxia: Secondary | ICD-10-CM | POA: Diagnosis not present

## 2021-10-30 DIAGNOSIS — J441 Chronic obstructive pulmonary disease with (acute) exacerbation: Secondary | ICD-10-CM | POA: Diagnosis not present

## 2021-11-13 ENCOUNTER — Inpatient Hospital Stay: Payer: Medicare HMO

## 2021-11-13 ENCOUNTER — Inpatient Hospital Stay (HOSPITAL_BASED_OUTPATIENT_CLINIC_OR_DEPARTMENT_OTHER): Payer: Medicare HMO | Admitting: Internal Medicine

## 2021-11-13 ENCOUNTER — Encounter: Payer: Self-pay | Admitting: Internal Medicine

## 2021-11-13 ENCOUNTER — Inpatient Hospital Stay: Payer: Medicare HMO | Attending: Internal Medicine

## 2021-11-13 DIAGNOSIS — Z79899 Other long term (current) drug therapy: Secondary | ICD-10-CM | POA: Diagnosis not present

## 2021-11-13 DIAGNOSIS — M2550 Pain in unspecified joint: Secondary | ICD-10-CM | POA: Insufficient documentation

## 2021-11-13 DIAGNOSIS — C7951 Secondary malignant neoplasm of bone: Secondary | ICD-10-CM

## 2021-11-13 DIAGNOSIS — J449 Chronic obstructive pulmonary disease, unspecified: Secondary | ICD-10-CM | POA: Insufficient documentation

## 2021-11-13 DIAGNOSIS — C787 Secondary malignant neoplasm of liver and intrahepatic bile duct: Secondary | ICD-10-CM | POA: Diagnosis not present

## 2021-11-13 DIAGNOSIS — Z801 Family history of malignant neoplasm of trachea, bronchus and lung: Secondary | ICD-10-CM | POA: Diagnosis not present

## 2021-11-13 DIAGNOSIS — Z823 Family history of stroke: Secondary | ICD-10-CM | POA: Diagnosis not present

## 2021-11-13 DIAGNOSIS — N400 Enlarged prostate without lower urinary tract symptoms: Secondary | ICD-10-CM | POA: Insufficient documentation

## 2021-11-13 DIAGNOSIS — C3411 Malignant neoplasm of upper lobe, right bronchus or lung: Secondary | ICD-10-CM | POA: Diagnosis not present

## 2021-11-13 DIAGNOSIS — M549 Dorsalgia, unspecified: Secondary | ICD-10-CM | POA: Diagnosis not present

## 2021-11-13 DIAGNOSIS — Z8249 Family history of ischemic heart disease and other diseases of the circulatory system: Secondary | ICD-10-CM | POA: Diagnosis not present

## 2021-11-13 DIAGNOSIS — M25512 Pain in left shoulder: Secondary | ICD-10-CM | POA: Insufficient documentation

## 2021-11-13 DIAGNOSIS — Z87891 Personal history of nicotine dependence: Secondary | ICD-10-CM | POA: Insufficient documentation

## 2021-11-13 DIAGNOSIS — Z7189 Other specified counseling: Secondary | ICD-10-CM

## 2021-11-13 DIAGNOSIS — R54 Age-related physical debility: Secondary | ICD-10-CM | POA: Diagnosis not present

## 2021-11-13 DIAGNOSIS — E039 Hypothyroidism, unspecified: Secondary | ICD-10-CM | POA: Insufficient documentation

## 2021-11-13 DIAGNOSIS — R0602 Shortness of breath: Secondary | ICD-10-CM | POA: Diagnosis not present

## 2021-11-13 LAB — CBC WITH DIFFERENTIAL/PLATELET
Abs Immature Granulocytes: 0.01 10*3/uL (ref 0.00–0.07)
Basophils Absolute: 0 10*3/uL (ref 0.0–0.1)
Basophils Relative: 1 %
Eosinophils Absolute: 0.6 10*3/uL — ABNORMAL HIGH (ref 0.0–0.5)
Eosinophils Relative: 10 %
HCT: 46 % (ref 39.0–52.0)
Hemoglobin: 14.7 g/dL (ref 13.0–17.0)
Immature Granulocytes: 0 %
Lymphocytes Relative: 17 %
Lymphs Abs: 1 10*3/uL (ref 0.7–4.0)
MCH: 30.6 pg (ref 26.0–34.0)
MCHC: 32 g/dL (ref 30.0–36.0)
MCV: 95.6 fL (ref 80.0–100.0)
Monocytes Absolute: 0.7 10*3/uL (ref 0.1–1.0)
Monocytes Relative: 13 %
Neutro Abs: 3.4 10*3/uL (ref 1.7–7.7)
Neutrophils Relative %: 59 %
Platelets: 163 10*3/uL (ref 150–400)
RBC: 4.81 MIL/uL (ref 4.22–5.81)
RDW: 12.8 % (ref 11.5–15.5)
WBC: 5.6 10*3/uL (ref 4.0–10.5)
nRBC: 0 % (ref 0.0–0.2)

## 2021-11-13 LAB — COMPREHENSIVE METABOLIC PANEL
ALT: 15 U/L (ref 0–44)
AST: 22 U/L (ref 15–41)
Albumin: 4 g/dL (ref 3.5–5.0)
Alkaline Phosphatase: 56 U/L (ref 38–126)
Anion gap: 5 (ref 5–15)
BUN: 8 mg/dL (ref 8–23)
CO2: 41 mmol/L — ABNORMAL HIGH (ref 22–32)
Calcium: 9.3 mg/dL (ref 8.9–10.3)
Chloride: 91 mmol/L — ABNORMAL LOW (ref 98–111)
Creatinine, Ser: 0.52 mg/dL — ABNORMAL LOW (ref 0.61–1.24)
GFR, Estimated: >60 mL/min (ref >60)
Glucose, Bld: 101 mg/dL — ABNORMAL HIGH (ref 70–99)
Potassium: 4.2 mmol/L (ref 3.5–5.1)
Sodium: 137 mmol/L (ref 135–145)
Total Bilirubin: 0.7 mg/dL (ref 0.3–1.2)
Total Protein: 7.6 g/dL (ref 6.5–8.1)

## 2021-11-13 LAB — TSH: TSH: 3.325 u[IU]/mL (ref 0.350–4.500)

## 2021-11-13 MED ORDER — SODIUM CHLORIDE 0.9 % IV SOLN
Freq: Once | INTRAVENOUS | Status: AC
  Filled 2021-11-13: qty 250

## 2021-11-13 MED ORDER — HEPARIN SOD (PORK) LOCK FLUSH 100 UNIT/ML IV SOLN
500.0000 [IU] | Freq: Once | INTRAVENOUS | Status: AC | PRN
  Administered 2021-11-13: 500 [IU]
  Filled 2021-11-13: qty 5

## 2021-11-13 MED ORDER — ZOLEDRONIC ACID 4 MG/5ML IV CONC
3.0000 mg | Freq: Once | INTRAVENOUS | Status: AC
  Administered 2021-11-13: 3 mg via INTRAVENOUS
  Filled 2021-11-13: qty 3.75

## 2021-11-13 MED ORDER — SODIUM CHLORIDE 0.9 % IV SOLN
200.0000 mg | Freq: Once | INTRAVENOUS | Status: AC
  Administered 2021-11-13: 200 mg via INTRAVENOUS
  Filled 2021-11-13: qty 200

## 2021-11-13 NOTE — Assessment & Plan Note (Addendum)
#   Stage IV metastatic non-small cell lung cancer favor adeno; mets to bone; liver.  Currently on Keytruda maintenance-  Given the oligometastatic progression-s/p RT right upper lobe mass [until dec 15th, 2022]. FEB 9th, 2023-  Significant interval decrease in size of a previouslyvhypermetabolic soft tissue mass about the posterior right upperlobe; multiple treated liver metastases/bone metastases unchanged. STABLE.  ? ?# proceed with [place] maintainance today. Labs today reviewed;  acceptable for treatment today.  Will order imaging at next visit. ? ?# Bone metastases- on zometa 3 mg IVPB.  Every 6 weeks. Ca-9.5;Clinically- STABLE.  ? ?# left shoulder pain- MSK- recommend NSAIDs/Topical. .Clinically-STABLE ? ?#Debility-spinal cord compression/status post surgery-s/p physical therapy-. Clinically-STABLE ? ?# Hypothyroidism- [ Mid sep 2022]  Normal T3-T4.  Currently on 50 mcg a day; MARCH 2023- 5.4 labs today- STABLE. ? ?# COPD-. On 2 lit O2; Trelegy.Clinically- STABLE. ? ?  ZOMETA-? q 6W; TSH ? ?# DISPOSITION:  ?#  Keytruda; Zometa ?# follow up in 3 weeks- labs- cbc/cmp; Keytruda; -Dr.B ? ? ? ? ?.  ? ?

## 2021-11-13 NOTE — Progress Notes (Signed)
[facility] ?CONSULT NOTE ? ?Patient Care Team: ?Steele Sizer, MD as PCP - General (Family Medicine) ?Christene Lye, MD (General Surgery) ?Telford Nab, RN as Sales executive ?Cammie Sickle, MD as Consulting Physician (Hematology and Oncology) ? ?CHIEF COMPLAINTS/PURPOSE OF CONSULTATION: Lung cancer ? ?#  ?Oncology History Overview Note  ?# April 2021- RUL lung cancer; liver met; spinal cord compression/ vertebral mets [[facility]]; [[place]]; LIVER Bx- Metastatic adenocarcinoma, consistent with lung primary.- TPS % Interpretation -PD-L1 IHC 10 LOW EXPRESSION POSITIVE  ? 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  ? ?# 2021- MAY 26th-Keytrda x2 cycles; [borderline PS] July 7th-carbo Alimta Keytruda cycle #1 ? ?#November 2022-oligometastatic progression-right upper lobe subpleural involvement with; s/p radiation finished December 15.  Temporarily held [place]. ? ?#Jan fourth 2023-restarted Keytruda. ? ?# NGS/MOLECULAR TESTS: ? ? ?# PALLIATIVE CARE EVALUATION: ? ?# PAIN MANAGEMENT:  ? ? ?DIAGNOSIS:  ? ?STAGE:         ;  GOALS: ? ?CURRENT/MOST RECENT THERAPY :  ? ?  ?Cancer of upper lobe of right lung ([place])  ?11/23/2019 Initial Diagnosis  ? Cancer of upper lobe of right lung ([place]) ?  ?12/21/2019 -  Chemotherapy  ? Patient is on Treatment Plan : LUNG CARBOplatin / Pemetrexed / Pembrolizumab q21d Induction x 4 cycles / Maintenance Pemetrexed + Pembrolizumab  ? ?  ?  ? ?HISTORY OF PRESENTING ILLNESS: Thin built frail appearing male patient.  No acute distress.  Alone.   Nasal cannula oxygen. ? ?Cody [name] 66 y.o.  male patient with metastatic non-small cell lung cancer; cord compression status post decompressive surgery; currently on Keytruda maintenance-is here for follow-up. ? ?Patient denies any fevers or chills.  No nausea no vomiting.  Chronic mild fatigue.  No  worsening shortness of breath or cough.  No diarrhea.  No rash. ? ?Review of Systems  ?Constitutional:  Positive for malaise/fatigue. Negative for chills, diaphoresis and fever.  ?HENT:  Negative for nosebleeds and sore throat.   ?Eyes:  Negative for double vision.  ?Respiratory:  Positive for shortness of breath. Negative for cough, hemoptysis, sputum production and wheezing.   ?Cardiovascular:  Negative for chest pain, palpitations, orthopnea and leg swelling.  ?Gastrointestinal:  Negative for abdominal pain, blood in stool, constipation, heartburn, melena, nausea and vomiting.  ?Genitourinary:  Negative for dysuria, frequency and urgency.  ?Musculoskeletal:  Positive for back pain and joint pain.  ?Skin: Negative.  Negative for itching and rash.  ?Neurological:  Negative for dizziness, tingling, focal weakness, weakness and headaches.  ?Endo/Heme/Allergies:  Does not bruise/bleed easily.  ?Psychiatric/Behavioral:  Negative for depression. The patient is not nervous/anxious and does not have insomnia.    ? ?MEDICAL HISTORY:  ?Past Medical History:  ?Diagnosis Date  ? Allergy   ? Cancer of upper lobe of right lung ([place]) 10/2019  ? COPD (chronic obstructive pulmonary disease) ([place])   ? Prostate enlargement   ? ? ?SURGICAL HISTORY: ?Past Surgical History:  ?Procedure Laterality Date  ? HERNIA REPAIR Bilateral 1982  ? HERNIA REPAIR Right 1994  ? IR IMAGING GUIDED PORT INSERTION  02/24/2020  ? LAMINECTOMY FOR EXCISION / EVACUATION INTRASPINAL LESION  11/07/2019  ? T1-T 6 at [facility]   ? ? ?SOCIAL HISTORY: ?Social History  ? ?Socioeconomic History  ? Marital status: Married  ?  Spouse name: Olegario Shearer   ? Number of children: 4  ? Years of education:  Not on file  ? Highest education level: Some college, no degree  ?Occupational History  ? Occupation: dispatch and delivery   ?Tobacco Use  ? Smoking status: Former  ?  Packs/day: 2.00  ?  Years: 30.00  ?  Pack years: 60.00  ?  Types: Cigarettes  ?  Quit date: 07/29/2011  ?  Years since  quitting: 10.3  ? Smokeless tobacco: Never  ? Tobacco comments:  ?  pt vapes  ?Vaping Use  ? Vaping Use: Not on file  ?Substance and Sexual Activity  ? Alcohol use: Yes  ?  Alcohol/week: 5.0 standard drinks  ?  Types: 5 Standard drinks or equivalent per week  ?  Comment: 1-2/day  ? Drug use: No  ? Sexual activity: Not Currently  ?  Partners: Female  ?Other Topics Concern  ? Not on file  ?Social History Narrative  ? Worked in warehouse/ quit smoking in 2015; beer 1/day; in [place]; with wife.   ? ?Social Determinants of Health  ? ?Financial Resource Strain: Not on file  ?Food Insecurity: Not on file  ?Transportation Needs: Not on file  ?Physical Activity: Not on file  ?Stress: Not on file  ?Social Connections: Not on file  ?Intimate Partner Violence: Not on file  ? ? ?FAMILY HISTORY: ?Family History  ?Problem Relation Age of Onset  ? Heart disease Father   ? CVA Mother   ? Lung cancer Sister   ? Lung cancer Brother   ? ? ?ALLERGIES:  has No Known Allergies. ? ?MEDICATIONS:  ?Current Outpatient Medications  ?Medication Sig Dispense Refill  ? acetaminophen (TYLENOL) 500 MG tablet Take 500-1,000 mg by mouth every 6 (six) hours as needed for mild pain or fever.     ? albuterol (VENTOLIN HFA) 108 (90 Base) MCG/ACT inhaler Inhale 2 puffs into the lungs every 4 (four) hours as needed for wheezing or shortness of breath. 18 g 0  ? Calcium Carb-Cholecalciferol (OYSTER SHELL CALCIUM) 500-400 MG-UNIT TABS Take 500 tablets by mouth.    ? escitalopram (LEXAPRO) 5 MG tablet TAKE ONE TABLET BY MOUTH EVERY MORNING 90 tablet 0  ? Fluticasone-Umeclidin-Vilant (TRELEGY ELLIPTA) 100-62.5-25 MCG/INH AEPB Inhale 1 puff into the lungs daily. 180 each 1  ? levothyroxine (SYNTHROID) 50 MCG tablet Take 1 tablet (50 mcg total) by mouth daily before breakfast. 90 tablet 0  ? lidocaine-prilocaine (EMLA) cream Apply 1 application topically as needed. Apply small amount to port site at least 1 hour prior to it being accessed, cover with plastic  wrap 30 g 1  ? ondansetron (ZOFRAN) 4 MG tablet Take 1 tablet (4 mg total) by mouth every 8 (eight) hours as needed for nausea or vomiting. 20 tablet 2  ? prochlorperazine (COMPAZINE) 10 MG tablet Take 1 tablet (10 mg total) by mouth every 6 (six) hours as needed for nausea or vomiting. 40 tablet 1  ? QUEtiapine (SEROQUEL) 25 MG tablet TAKE ONE TABLET BY MOUTH EVERY NIGHT AT BEDTIME 90 tablet 0  ? ?No current facility-administered medications for this visit.  ? ? ?  ?. ? ?PHYSICAL EXAMINATION: ?ECOG PERFORMANCE STATUS: 3 - Symptomatic, >50% confined to bed ? ?Vitals:  ? 11/13/21 0850  ?BP: (!) 151/82  ?Pulse: 80  ?Temp: (!) 95.4 ?F (35.2 ?C)  ?SpO2: 100%  ? ?Filed Weights  ? 11/13/21 0850  ?Weight: 126 lb (57.2 kg)  ? ? ?Physical Exam ?HENT:  ?   Head: Normocephalic and atraumatic.  ?   Mouth/Throat:  ?  Pharynx: No oropharyngeal exudate.  ?Eyes:  ?   Pupils: Pupils are equal, round, and reactive to light.  ?Cardiovascular:  ?   Rate and Rhythm: Normal rate and regular rhythm.  ?Pulmonary:  ?   Effort: No respiratory distress.  ?   Breath sounds: No wheezing.  ?   Comments: Decreased air entry bilaterally. ?Abdominal:  ?   General: Bowel sounds are normal. There is no distension.  ?   Palpations: Abdomen is soft. There is no mass.  ?   Tenderness: There is no abdominal tenderness. There is no guarding or rebound.  ?Musculoskeletal:     ?   General: No tenderness. Normal range of motion.  ?   Cervical back: Normal range of motion and neck supple.  ?Skin: ?   General: Skin is warm.  ?Neurological:  ?   Mental Status: He is alert and oriented to person, place, and time.  ?Psychiatric:     ?   Mood and Affect: Affect normal.  ? ? ? ?LABORATORY DATA:  ?I have reviewed the data as listed ?Lab Results  ?Component Value Date  ? WBC 5.6 11/13/2021  ? HGB 14.7 11/13/2021  ? HCT 46.0 11/13/2021  ? MCV 95.6 11/13/2021  ? PLT 163 11/13/2021  ? ?Recent Labs  ?  10/02/21 ?0825 10/23/21 ?3524 11/13/21 ?0833  ?NA 135 138 137  ?K  3.9 4.2 4.2  ?CL 94* 96* 91*  ?CO2 35* 33* 41*  ?GLUCOSE 107* 101* 101*  ?BUN _0 ?CREATININE 0.55* 0.45* 0.52*  ?CALCIUM 9.5 8.9 9.3  ?GFRNONAA >60 >60 >60  ?PROT 7.3 7.5 7.6  ?ALBUMIN 4.0 3.9 4.0  ?AST

## 2021-11-27 ENCOUNTER — Encounter: Payer: Self-pay | Admitting: Internal Medicine

## 2021-11-29 DIAGNOSIS — J9601 Acute respiratory failure with hypoxia: Secondary | ICD-10-CM | POA: Diagnosis not present

## 2021-11-29 DIAGNOSIS — J441 Chronic obstructive pulmonary disease with (acute) exacerbation: Secondary | ICD-10-CM | POA: Diagnosis not present

## 2021-12-01 IMAGING — MR MR LUMBAR SPINE W/O CM
5 series · 32 of 48 positions shown · non-contrast
Comparison: Thoracic spine MRI today reported separately.

CLINICAL DATA: 63-year-old male with recent upper back pain thought
related to work injury. Increasing numbness in both legs since last
week.

EXAM:
MRI LUMBAR SPINE WITHOUT CONTRAST
TECHNIQUE: Multiplanar, multisequence MR imaging of the lumbar spine was
performed. No intravenous contrast was administered.

[Series 16: T2 · sagittal · 4.0mm · 0.81mm/px · 7 of 17 slices shown (1 of 2)]
[im 1/17]
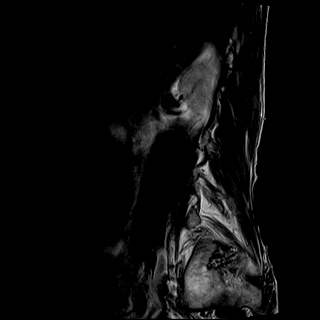
[im 3/17]
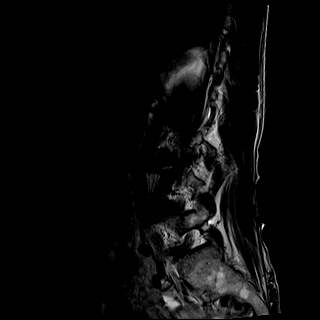
[im 6/17]
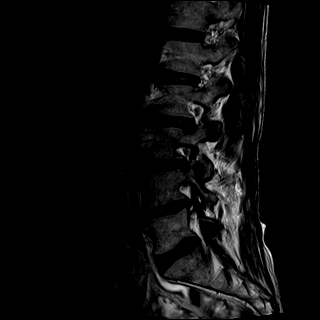
[im 9/17]
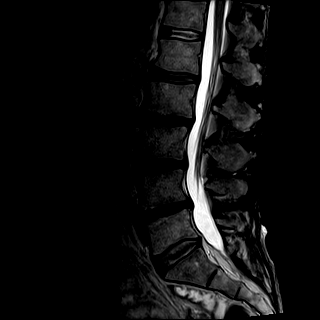
[im 11/17]
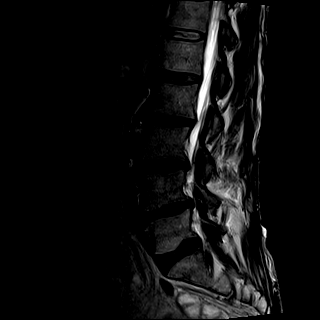
[im 14/17]
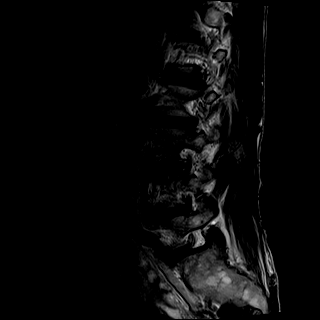
[im 17/17]
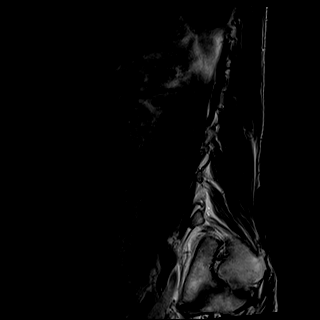

[Series 17: T1 · sagittal · 4.0mm · 0.81mm/px · 7 of 17 slices shown (1 of 2)]
[im 1/17]
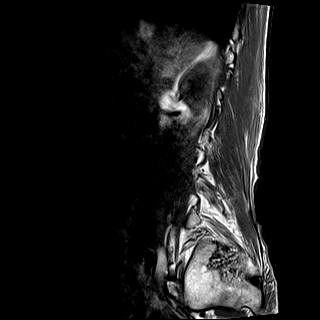
[im 3/17]
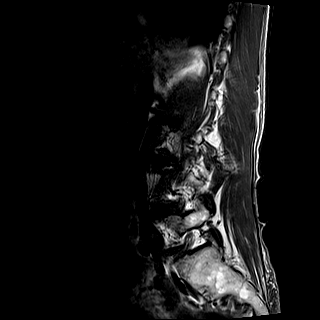
[im 6/17]
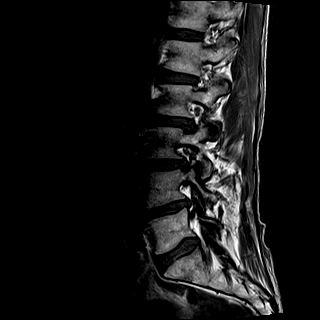
[im 9/17]
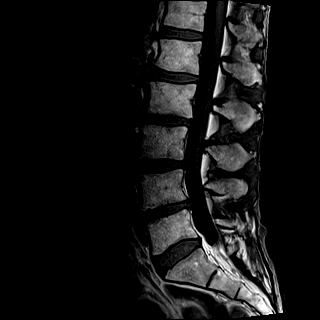
[im 11/17]
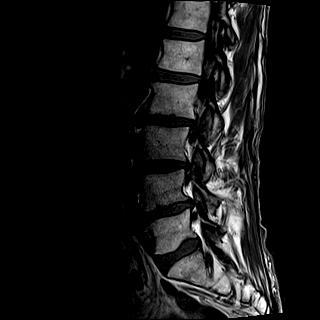
[im 14/17]
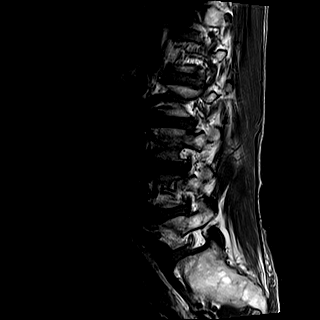
[im 17/17]
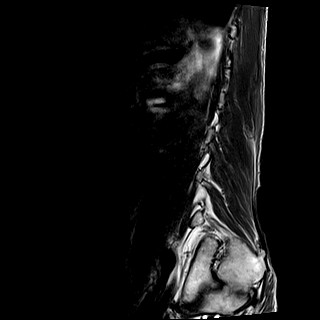

[Series 18: STIR · sagittal · 4.0mm · 0.41mm/px · 2 of 17 slices shown]
[im 1/17]
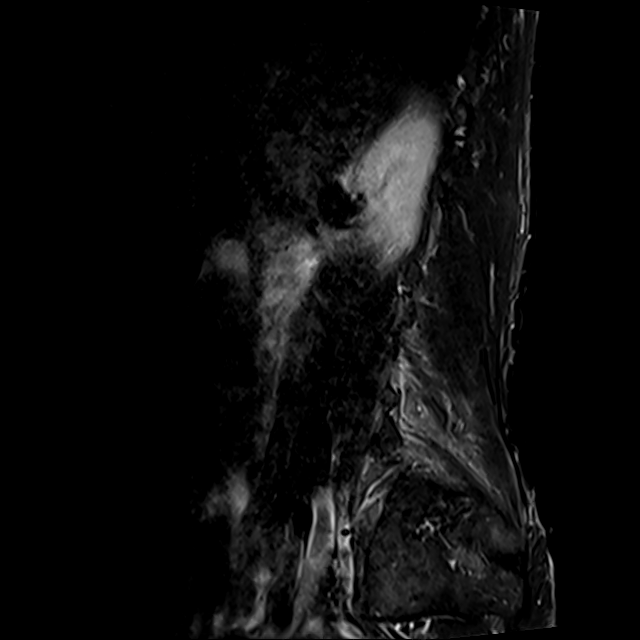
[im 4/17]
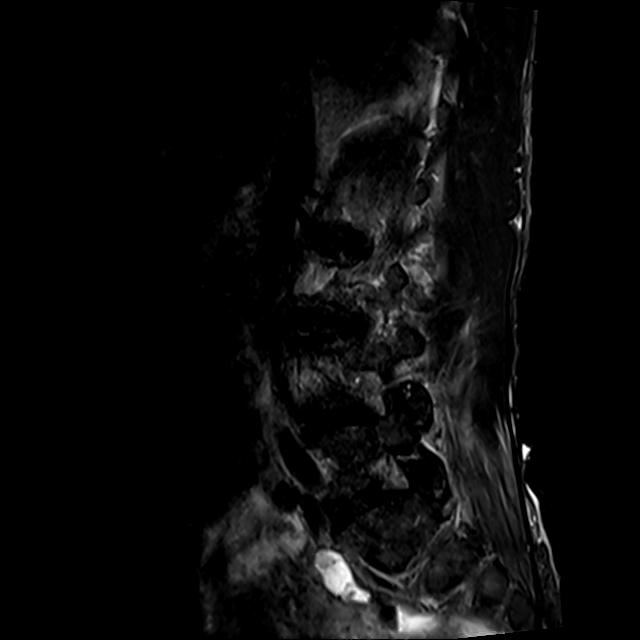

[Series 19: T2 · axial · 4.0mm · 0.78mm/px · z∈[-557,-345]mm · 8 of 38 slices shown (2 of 2)]
[im 1/38]
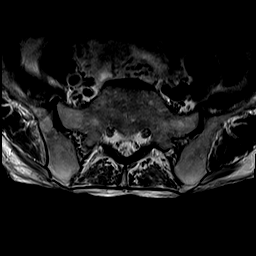
[im 6/38]
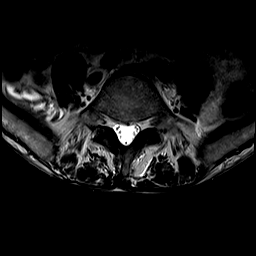
[im 12/38]
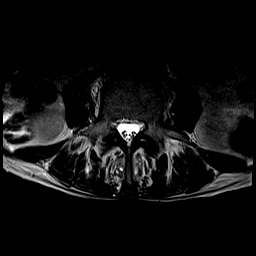
[im 18/38]
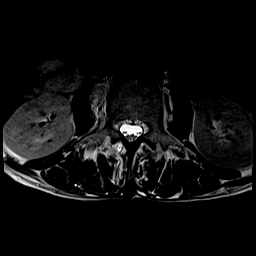
[im 20/38]
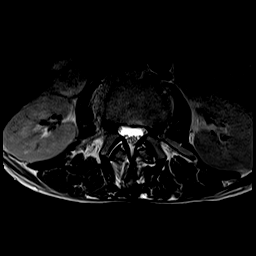
[im 26/38]
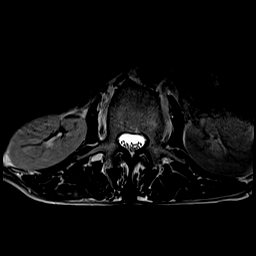
[im 32/38]
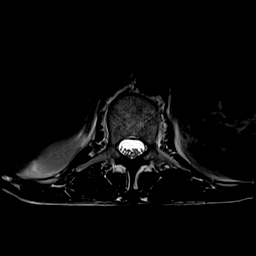
[im 38/38]
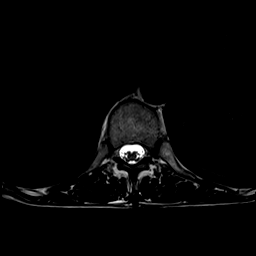

[Series 20: T1 · axial · 4.0mm · 0.39mm/px · z∈[-557,-345]mm · 8 of 38 slices shown (2 of 2)]
[im 1/38]
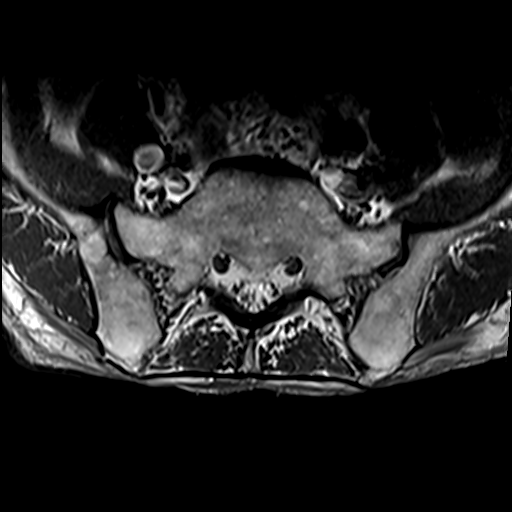
[im 6/38]
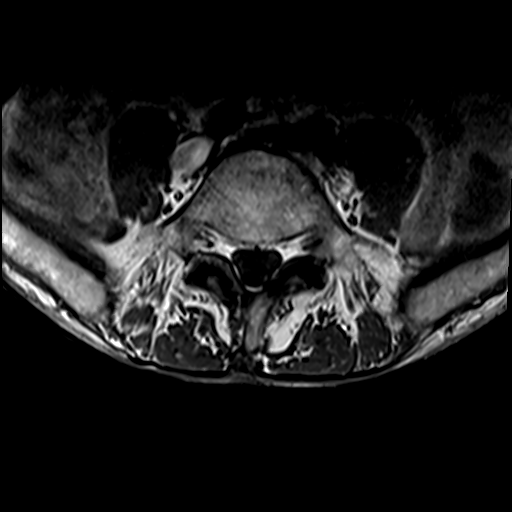
[im 12/38]
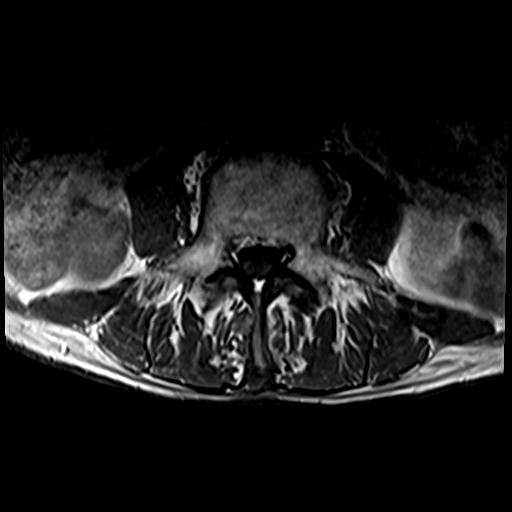
[im 18/38]
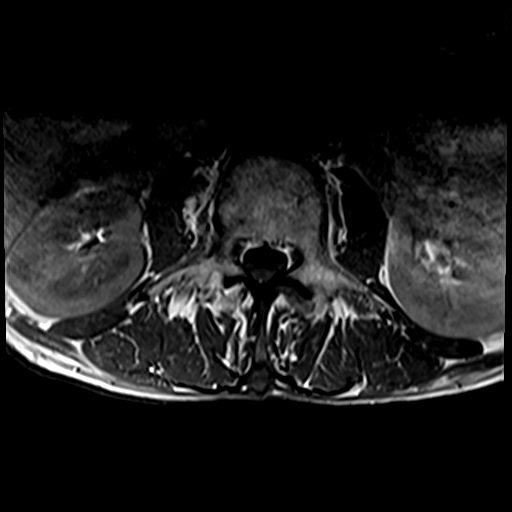
[im 20/38]
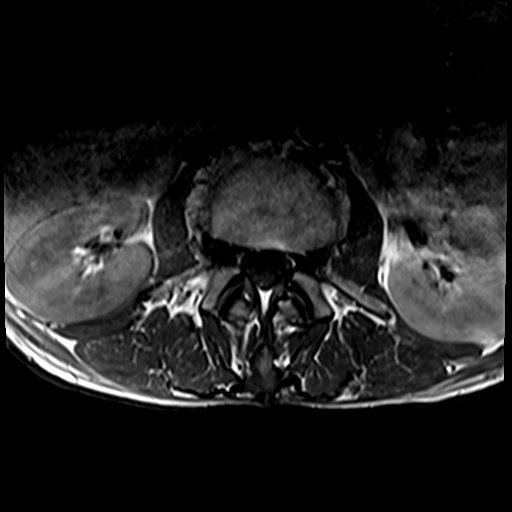
[im 26/38]
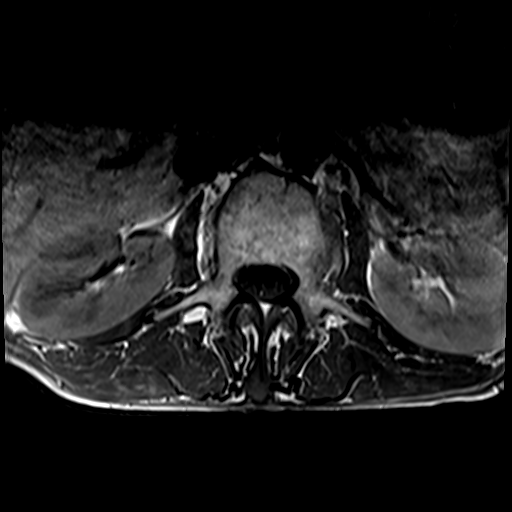
[im 32/38]
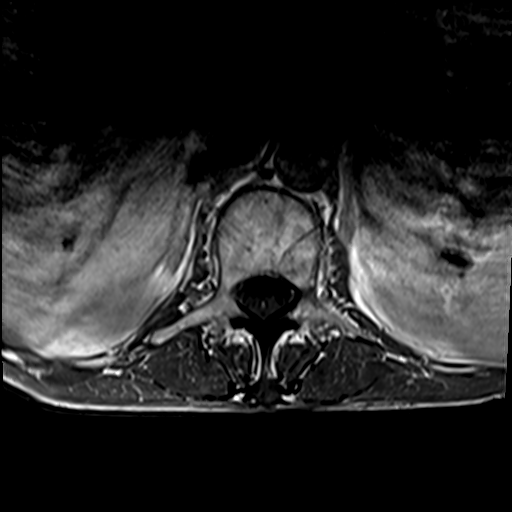
[im 38/38]
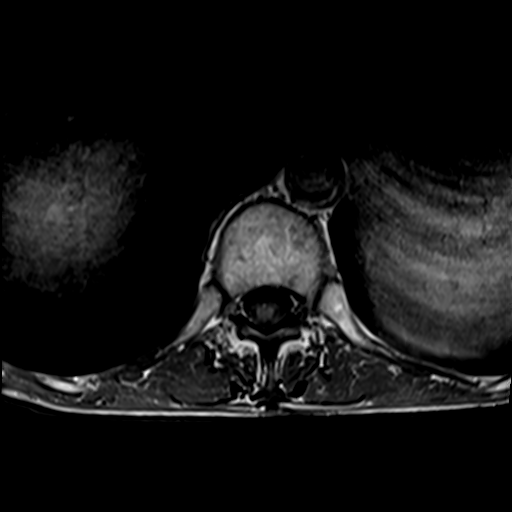

[32 of 48 positions shown; findings below may reference images not displayed]

FINDINGS: Segmentation:  Lumbar segmentation appears to be normal.

Alignment:  Relatively preserved lumbar lordosis.

Vertebrae: Visualized bone marrow signal is within normal limits. No
lumbar or visible sacral marrow edema or suspicious marrow lesion.

Conus medullaris and cauda equina: Conus extends to the T12-L1
level. No lower spinal cord or conus signal abnormality. Normal
noncontrast appearance of the cauda equina nerve roots.

Paraspinal and other soft tissues: Visualized noncontrast abdominal
viscera and paraspinal soft tissues are within normal limits.

Disc levels:

No significant lumbar spinal stenosis. There is chronic disc and
endplate degeneration L2-L3 through L4-L5 with some superimposed
lumbar facet hypertrophy.
IMPRESSION: 1. No acute or metastatic process identified in the lumbar spine.
See Thoracic Spine MRI today reported separately.
2. Lumbar spine degeneration without significant spinal stenosis.

## 2021-12-01 IMAGING — MR MR THORACIC SPINE W/O CM
4 of 7 series · 25 of 48 positions shown · non-contrast
Comparison: Cervical spine MRI today reported separately. Chest CT
05/26/2017.

CLINICAL DATA: 63-year-old male with recent upper back pain thought
related to work injury. Increasing numbness in both legs since last
week.

EXAM:
MRI THORACIC SPINE WITHOUT CONTRAST
TECHNIQUE: Multiplanar, multisequence MR imaging of the thoracic spine was
performed. No intravenous contrast was administered.

[Series 16: T2 · sagittal · 3.0mm · 1.06mm/px · 5 of 19 slices shown (1 of 2)]
[im 1/19]
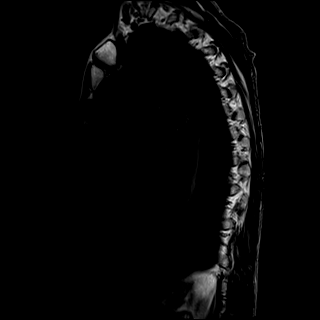
[im 5/19]
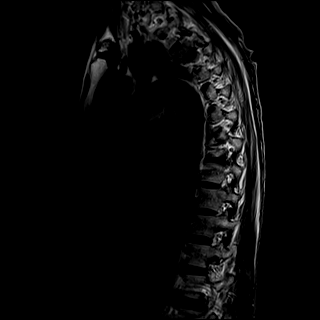
[im 10/19]
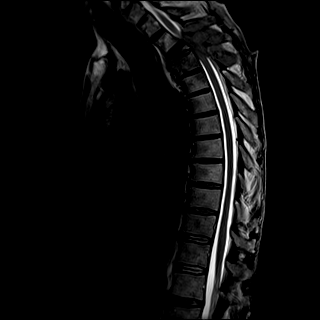
[im 14/19]
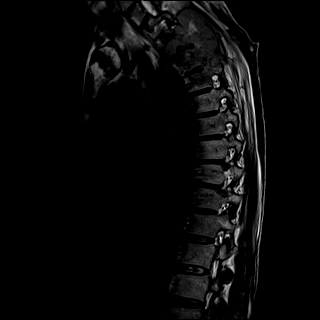
[im 19/19]
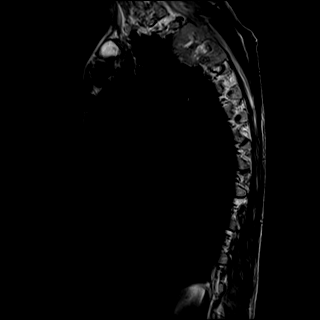

[Series 17: T1 · sagittal · 3.0mm · 1.06mm/px · 5 of 19 slices shown (1 of 2)]
[im 1/19]
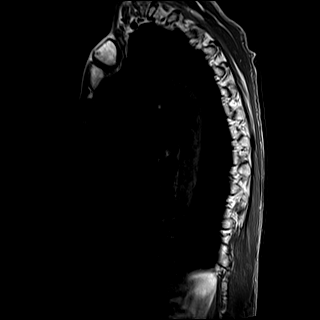
[im 5/19]
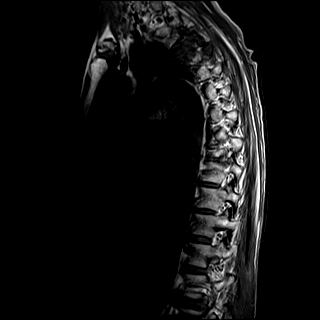
[im 10/19]
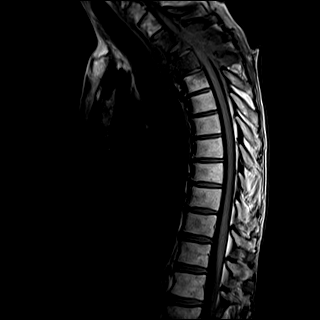
[im 14/19]
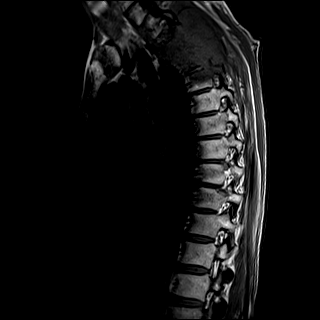
[im 19/19]
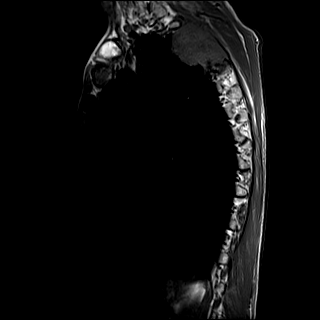

[Series 20: T2 · axial · 4.0mm · 0.59mm/px · z∈[-344,-147]mm · 8 of 38 slices shown (2 of 2)]
[im 1/38]
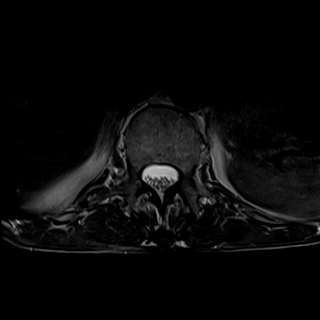
[im 5/38]
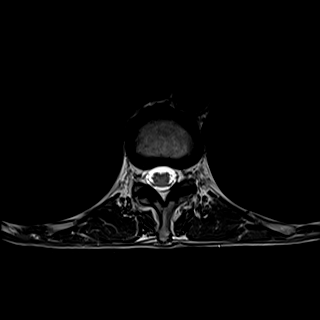
[im 13/38]
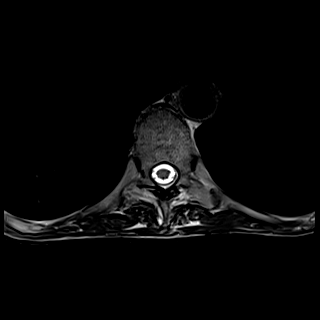
[im 17/38]
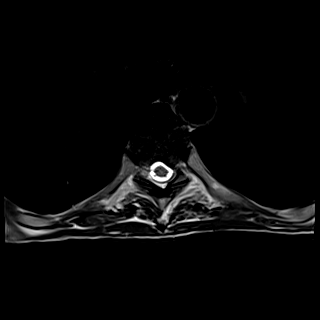
[im 21/38]
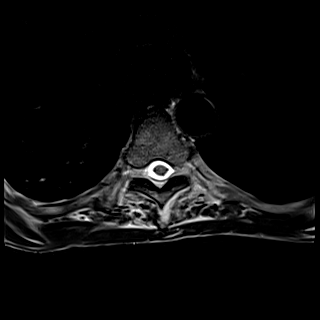
[im 25/38]
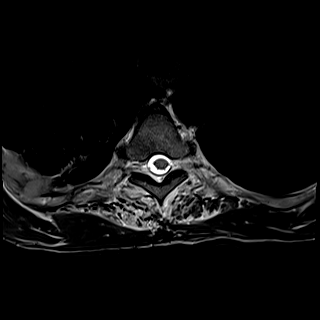
[im 33/38]
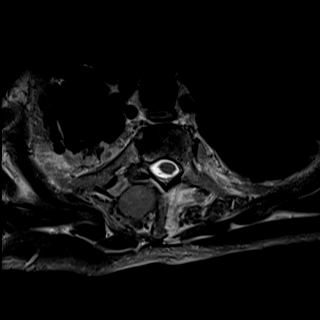
[im 38/38]
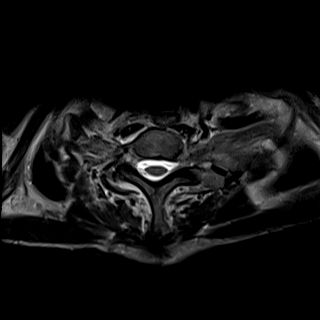

[Series 22: T1 · axial · non-contrast · 4.0mm · 0.31mm/px · z∈[-344,-162]mm · 7 of 38 slices shown (2 of 2)]
[im 1/38]
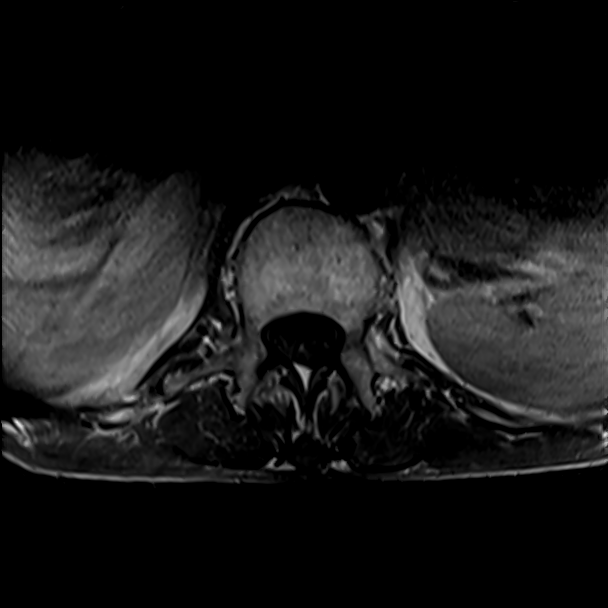
[im 5/38]
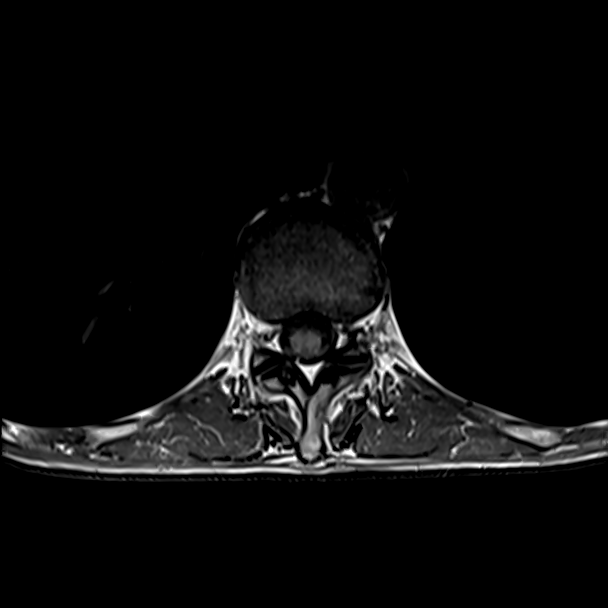
[im 13/38]
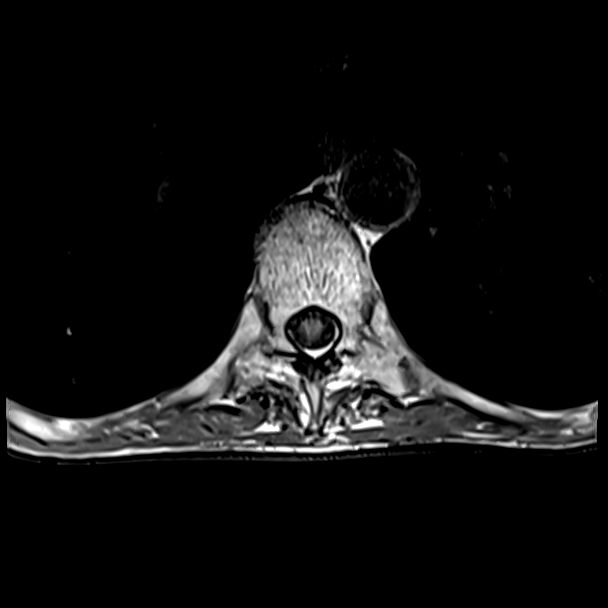
[im 17/38]
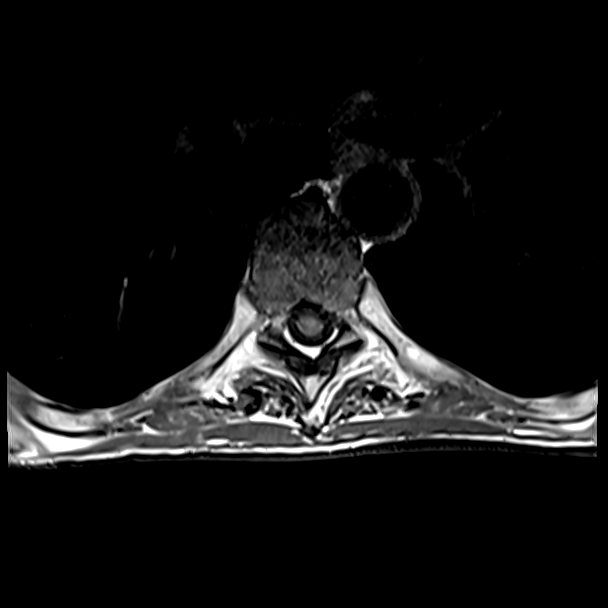
[im 21/38]
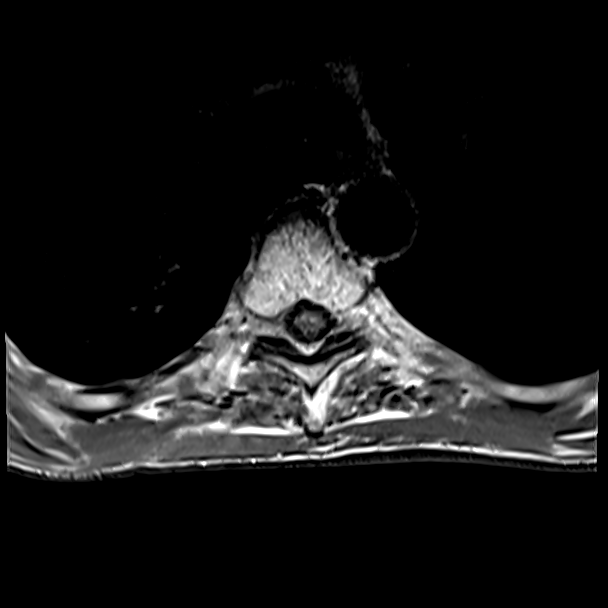
[im 25/38]
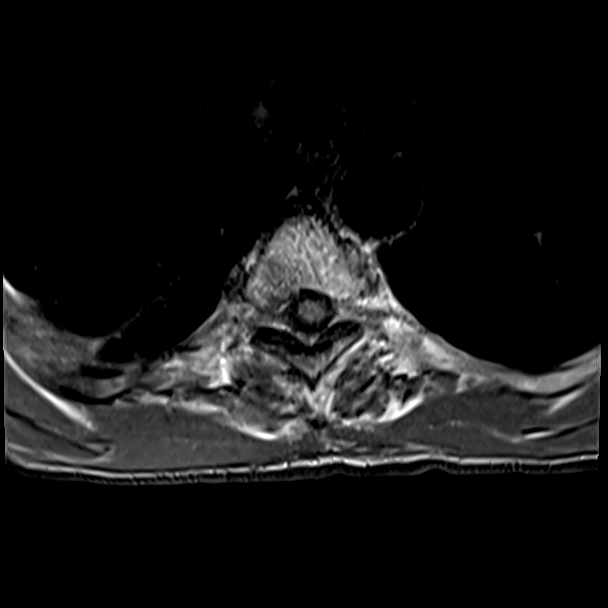
[im 33/38]
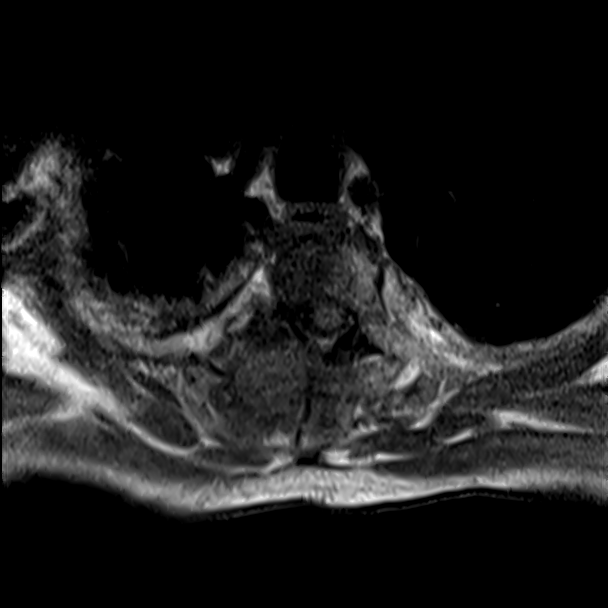

[25 of 48 positions shown; findings below may reference images not displayed]

FINDINGS: Alignment: Normal cervicothoracic junction alignment. Mildly
exaggerated upper thoracic kyphosis in conjunction with the
pathologic findings below. Subtle anterolisthesis of T2 on T3.

Vertebrae: Pathologic compression fracture of T3 with vertebra
plana.

Abnormal marrow signal indicative of vertebral tumor and/or marrow
edema also throughout the T2, T4, and right T5 vertebrae.
Involvement of the posterior ribs at those levels, and additional
bulky tumor (up to nearly 3 cm diameter) tracking along the skin
right posterolateral 4th rib (series 20, image 13).

Tumor invading and destroying the upper thoracic spine epicenter at
T3 encompasses up to 7.4 cm diameter. See series 20, image 9 and
series 16, image 5. Epidural invasion, foraminal invasion, and cord
compression at that level (see below). The right T2 through T4
neural foramina are obliterated. There is also tumor in the left T3
foramen.

The T1 vertebra is relatively spared at this time although there is
tumor in the T1-T2 interspinous space.

The T6 through T12 vertebrae appear spared.

Cord: Tumor throughout the spinal canal from the T2 to the T4 level
with cord compression maximal at T3 (series 20, image 9) where the
cord is compressed to about 3-4 mm AP. There is subtle associated
abnormal cord signal.

Below the mid T4 level thoracic cord signal and morphology is
normal. Normal conus at T12-L1.

Paraspinal and other soft tissues: Bulky right paraspinal tumor in
the upper thoracic spine and posterior right 4th rib space as
detailed above. There is superimposed chronic right apical lung
disease, and there might be additional tumor in the anterior right
lung periphery as seen on series 20, image 6.

Disc levels:

Malignant findings in the upper thoracic spine as stated above. No
superimposed age advanced thoracic spine degeneration.
IMPRESSION: 1. T3 level right paraspinal bulky and destructive soft tissue
tumor, 7.4 cm diameter.
T3 pathologic fracture with vertebra plana.
Widespread epidural tumor extension with SPINAL CORD COMPRESSION and
mild cord edema maximal at T3.
Tumor obliterating the bilateral T3 and right T2 and T4 neural
foramina.
Bulky tumor also growing laterally along the right 4th rib.
2. Top differential considerations are Lung Cancer and Plasmacytoma.
Lymphoma is also considered but felt less likely.

Critical Value/emergent results were called by telephone at the time
of interpretation on 10/31/2019 at [DATE] to Dr. LKW TIGER , who
verbally acknowledged these results.

## 2021-12-04 ENCOUNTER — Inpatient Hospital Stay: Payer: Medicare HMO

## 2021-12-04 ENCOUNTER — Inpatient Hospital Stay (HOSPITAL_BASED_OUTPATIENT_CLINIC_OR_DEPARTMENT_OTHER): Payer: Medicare HMO | Admitting: Internal Medicine

## 2021-12-04 ENCOUNTER — Inpatient Hospital Stay: Payer: Medicare HMO | Attending: Internal Medicine

## 2021-12-04 DIAGNOSIS — M549 Dorsalgia, unspecified: Secondary | ICD-10-CM | POA: Insufficient documentation

## 2021-12-04 DIAGNOSIS — R54 Age-related physical debility: Secondary | ICD-10-CM | POA: Insufficient documentation

## 2021-12-04 DIAGNOSIS — M2550 Pain in unspecified joint: Secondary | ICD-10-CM | POA: Insufficient documentation

## 2021-12-04 DIAGNOSIS — N400 Enlarged prostate without lower urinary tract symptoms: Secondary | ICD-10-CM | POA: Insufficient documentation

## 2021-12-04 DIAGNOSIS — C787 Secondary malignant neoplasm of liver and intrahepatic bile duct: Secondary | ICD-10-CM | POA: Diagnosis not present

## 2021-12-04 DIAGNOSIS — Z7189 Other specified counseling: Secondary | ICD-10-CM

## 2021-12-04 DIAGNOSIS — Z8249 Family history of ischemic heart disease and other diseases of the circulatory system: Secondary | ICD-10-CM | POA: Insufficient documentation

## 2021-12-04 DIAGNOSIS — Z923 Personal history of irradiation: Secondary | ICD-10-CM | POA: Diagnosis not present

## 2021-12-04 DIAGNOSIS — R0602 Shortness of breath: Secondary | ICD-10-CM | POA: Insufficient documentation

## 2021-12-04 DIAGNOSIS — J432 Centrilobular emphysema: Secondary | ICD-10-CM | POA: Diagnosis not present

## 2021-12-04 DIAGNOSIS — E039 Hypothyroidism, unspecified: Secondary | ICD-10-CM | POA: Insufficient documentation

## 2021-12-04 DIAGNOSIS — C3411 Malignant neoplasm of upper lobe, right bronchus or lung: Secondary | ICD-10-CM

## 2021-12-04 DIAGNOSIS — Z7289 Other problems related to lifestyle: Secondary | ICD-10-CM | POA: Diagnosis not present

## 2021-12-04 DIAGNOSIS — M954 Acquired deformity of chest and rib: Secondary | ICD-10-CM | POA: Insufficient documentation

## 2021-12-04 DIAGNOSIS — Z823 Family history of stroke: Secondary | ICD-10-CM | POA: Insufficient documentation

## 2021-12-04 DIAGNOSIS — Z5112 Encounter for antineoplastic immunotherapy: Secondary | ICD-10-CM | POA: Diagnosis not present

## 2021-12-04 DIAGNOSIS — M47816 Spondylosis without myelopathy or radiculopathy, lumbar region: Secondary | ICD-10-CM | POA: Diagnosis not present

## 2021-12-04 DIAGNOSIS — Z79899 Other long term (current) drug therapy: Secondary | ICD-10-CM | POA: Diagnosis not present

## 2021-12-04 DIAGNOSIS — Z801 Family history of malignant neoplasm of trachea, bronchus and lung: Secondary | ICD-10-CM | POA: Diagnosis not present

## 2021-12-04 DIAGNOSIS — C7951 Secondary malignant neoplasm of bone: Secondary | ICD-10-CM | POA: Insufficient documentation

## 2021-12-04 DIAGNOSIS — Z87891 Personal history of nicotine dependence: Secondary | ICD-10-CM | POA: Diagnosis not present

## 2021-12-04 DIAGNOSIS — I700 Atherosclerosis of aorta: Secondary | ICD-10-CM | POA: Diagnosis not present

## 2021-12-04 LAB — COMPREHENSIVE METABOLIC PANEL
ALT: 14 U/L (ref 0–44)
AST: 22 U/L (ref 15–41)
Albumin: 4 g/dL (ref 3.5–5.0)
Alkaline Phosphatase: 51 U/L (ref 38–126)
Anion gap: 5 (ref 5–15)
BUN: 8 mg/dL (ref 8–23)
CO2: 37 mmol/L — ABNORMAL HIGH (ref 22–32)
Calcium: 8.3 mg/dL — ABNORMAL LOW (ref 8.9–10.3)
Chloride: 92 mmol/L — ABNORMAL LOW (ref 98–111)
Creatinine, Ser: 0.48 mg/dL — ABNORMAL LOW (ref 0.61–1.24)
GFR, Estimated: >60 mL/min (ref >60)
Glucose, Bld: 112 mg/dL — ABNORMAL HIGH (ref 70–99)
Potassium: 4 mmol/L (ref 3.5–5.1)
Sodium: 134 mmol/L — ABNORMAL LOW (ref 135–145)
Total Bilirubin: 0.5 mg/dL (ref 0.3–1.2)
Total Protein: 7.5 g/dL (ref 6.5–8.1)

## 2021-12-04 LAB — CBC WITH DIFFERENTIAL/PLATELET
Abs Immature Granulocytes: 0.03 10*3/uL (ref 0.00–0.07)
Basophils Absolute: 0 10*3/uL (ref 0.0–0.1)
Basophils Relative: 1 %
Eosinophils Absolute: 0.5 10*3/uL (ref 0.0–0.5)
Eosinophils Relative: 8 %
HCT: 44.1 % (ref 39.0–52.0)
Hemoglobin: 14 g/dL (ref 13.0–17.0)
Immature Granulocytes: 1 %
Lymphocytes Relative: 18 %
Lymphs Abs: 1 10*3/uL (ref 0.7–4.0)
MCH: 30.7 pg (ref 26.0–34.0)
MCHC: 31.7 g/dL (ref 30.0–36.0)
MCV: 96.7 fL (ref 80.0–100.0)
Monocytes Absolute: 0.6 10*3/uL (ref 0.1–1.0)
Monocytes Relative: 11 %
Neutro Abs: 3.4 10*3/uL (ref 1.7–7.7)
Neutrophils Relative %: 61 %
Platelets: 162 10*3/uL (ref 150–400)
RBC: 4.56 MIL/uL (ref 4.22–5.81)
RDW: 12.8 % (ref 11.5–15.5)
WBC: 5.6 10*3/uL (ref 4.0–10.5)
nRBC: 0 % (ref 0.0–0.2)

## 2021-12-04 MED ORDER — SODIUM CHLORIDE 0.9 % IV SOLN
Freq: Once | INTRAVENOUS | Status: AC
  Filled 2021-12-04: qty 250

## 2021-12-04 MED ORDER — SODIUM CHLORIDE 0.9 % IV SOLN
200.0000 mg | Freq: Once | INTRAVENOUS | Status: AC
  Administered 2021-12-04: 200 mg via INTRAVENOUS
  Filled 2021-12-04: qty 200

## 2021-12-04 MED ORDER — SODIUM CHLORIDE 0.9% FLUSH
10.0000 mL | INTRAVENOUS | Status: DC | PRN
  Filled 2021-12-04: qty 10

## 2021-12-04 MED ORDER — HEPARIN SOD (PORK) LOCK FLUSH 100 UNIT/ML IV SOLN
500.0000 [IU] | Freq: Once | INTRAVENOUS | Status: AC | PRN
  Administered 2021-12-04: 500 [IU]
  Filled 2021-12-04: qty 5

## 2021-12-04 NOTE — Progress Notes (Signed)
[facility] ?CONSULT NOTE ? ?Patient Care Team: ?Steele Sizer, MD as PCP - General (Family Medicine) ?Christene Lye, MD (General Surgery) ?Telford Nab, RN as Sales executive ?Cammie Sickle, MD as Consulting Physician (Hematology and Oncology) ? ?CHIEF COMPLAINTS/PURPOSE OF CONSULTATION: Lung cancer ? ?#  ?Oncology History Overview Note  ?# April 2021- RUL lung cancer; liver met; spinal cord compression/ vertebral mets [[facility]]; [[place]]; LIVER Bx- Metastatic adenocarcinoma, consistent with lung primary.- TPS % Interpretation -PD-L1 IHC 10 LOW EXPRESSION POSITIVE  ? 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  ? ?# 2021- MAY 26th-Keytrda x2 cycles; [borderline PS] July 7th-carbo Alimta Keytruda cycle #1 ? ?#November 2022-oligometastatic progression-right upper lobe subpleural involvement with; s/p radiation finished December 15.  Temporarily held [place]. ? ?#Jan fourth 2023-restarted Keytruda. ? ?# NGS/MOLECULAR TESTS: ? ? ?# PALLIATIVE CARE EVALUATION: ? ?# PAIN MANAGEMENT:  ? ? ?DIAGNOSIS:  ? ?STAGE:         ;  GOALS: ? ?CURRENT/MOST RECENT THERAPY :  ? ?  ?Cancer of upper lobe of right lung ([place])  ?11/23/2019 Initial Diagnosis  ? Cancer of upper lobe of right lung ([place]) ?  ?12/21/2019 -  Chemotherapy  ? Patient is on Treatment Plan : LUNG CARBOplatin / Pemetrexed / Pembrolizumab q21d Induction x 4 cycles / Maintenance Pemetrexed + Pembrolizumab  ? ?  ?  ? ?HISTORY OF PRESENTING ILLNESS: Thin built frail appearing male patient.  No acute distress.  Alone.   Nasal cannula oxygen. ? ?[name] 66 y.o.  male patient with metastatic non-small cell lung cancer; cord compression status post decompressive surgery; currently on Keytruda maintenance-is here for follow-up.  ? ?Patient contemplating dental extraction in the next few weeks.  He has not called his dentist  yet. ? ?Patient denies any fevers or chills.  No nausea no vomiting.  Chronic mild fatigue.  No worsening shortness of breath or cough.  No diarrhea.  No rash. ? ?Review of Systems  ?Constitutional:  Positive for malaise/fatigue. Negative for chills, diaphoresis and fever.  ?HENT:  Negative for nosebleeds and sore throat.   ?Eyes:  Negative for double vision.  ?Respiratory:  Positive for shortness of breath. Negative for cough, hemoptysis, sputum production and wheezing.   ?Cardiovascular:  Negative for chest pain, palpitations, orthopnea and leg swelling.  ?Gastrointestinal:  Negative for abdominal pain, blood in stool, constipation, heartburn, melena, nausea and vomiting.  ?Genitourinary:  Negative for dysuria, frequency and urgency.  ?Musculoskeletal:  Positive for back pain and joint pain.  ?Skin: Negative.  Negative for itching and rash.  ?Neurological:  Negative for dizziness, tingling, focal weakness, weakness and headaches.  ?Endo/Heme/Allergies:  Does not bruise/bleed easily.  ?Psychiatric/Behavioral:  Negative for depression. The patient is not nervous/anxious and does not have insomnia.    ? ?MEDICAL HISTORY:  ?Past Medical History:  ?Diagnosis Date  ?? Allergy   ?? Cancer of upper lobe of right lung ([place]) 10/2019  ?? COPD (chronic obstructive pulmonary disease) ([place])   ?? Prostate enlargement   ? ? ?SURGICAL HISTORY: ?Past Surgical History:  ?Procedure Laterality Date  ?? HERNIA REPAIR Bilateral 1982  ?? HERNIA REPAIR Right 1994  ?? IR IMAGING GUIDED PORT INSERTION  02/24/2020  ?? LAMINECTOMY FOR EXCISION / EVACUATION INTRASPINAL LESION  11/07/2019  ? T1-T 6 at [facility]   ? ? ?SOCIAL HISTORY: ?Social History  ? ?Socioeconomic History  ?? Marital status:  Married  ?  Spouse name: Olegario Shearer   ?? Number of children: 4  ?? Years of education: Not on file  ?? Highest education level: Some college, no degree  ?Occupational History  ?? Occupation: dispatch and delivery   ?Tobacco Use  ?? Smoking status: Former  ?   Packs/day: 2.00  ?  Years: 30.00  ?  Pack years: 60.00  ?  Types: Cigarettes  ?  Quit date: 07/29/2011  ?  Years since quitting: 10.3  ?? Smokeless tobacco: Never  ?? Tobacco comments:  ?  pt vapes  ?Vaping Use  ?? Vaping Use: Not on file  ?Substance and Sexual Activity  ?? Alcohol use: Yes  ?  Alcohol/week: 5.0 standard drinks  ?  Types: 5 Standard drinks or equivalent per week  ?  Comment: 1-2/day  ?? Drug use: No  ?? Sexual activity: Not Currently  ?  Partners: Female  ?Other Topics Concern  ?? Not on file  ?Social History Narrative  ? Worked in warehouse/ quit smoking in 2015; beer 1/day; in [place]; with wife.   ? ?Social Determinants of Health  ? ?Financial Resource Strain: Not on file  ?Food Insecurity: Not on file  ?Transportation Needs: Not on file  ?Physical Activity: Not on file  ?Stress: Not on file  ?Social Connections: Not on file  ?Intimate Partner Violence: Not on file  ? ? ?FAMILY HISTORY: ?Family History  ?Problem Relation Age of Onset  ?? Heart disease Father   ?? CVA Mother   ?? Lung cancer Sister   ?? Lung cancer Brother   ? ? ?ALLERGIES:  has No Known Allergies. ? ?MEDICATIONS:  ?Current Outpatient Medications  ?Medication Sig Dispense Refill  ?? acetaminophen (TYLENOL) 500 MG tablet Take 500-1,000 mg by mouth every 6 (six) hours as needed for mild pain or fever.     ?? albuterol (VENTOLIN HFA) 108 (90 Base) MCG/ACT inhaler Inhale 2 puffs into the lungs every 4 (four) hours as needed for wheezing or shortness of breath. 18 g 0  ?? Calcium Carb-Cholecalciferol (OYSTER SHELL CALCIUM) 500-400 MG-UNIT TABS Take 500 tablets by mouth.    ?? escitalopram (LEXAPRO) 5 MG tablet TAKE ONE TABLET BY MOUTH EVERY MORNING 90 tablet 0  ?? Fluticasone-Umeclidin-Vilant (TRELEGY ELLIPTA) 100-62.5-25 MCG/INH AEPB Inhale 1 puff into the lungs daily. 180 each 1  ?? levothyroxine (SYNTHROID) 50 MCG tablet Take 1 tablet (50 mcg total) by mouth daily before breakfast. 90 tablet 0  ?? lidocaine-prilocaine (EMLA) cream  Apply 1 application topically as needed. Apply small amount to port site at least 1 hour prior to it being accessed, cover with plastic wrap 30 g 1  ?? ondansetron (ZOFRAN) 4 MG tablet Take 1 tablet (4 mg total) by mouth every 8 (eight) hours as needed for nausea or vomiting. 20 tablet 2  ?? prochlorperazine (COMPAZINE) 10 MG tablet Take 1 tablet (10 mg total) by mouth every 6 (six) hours as needed for nausea or vomiting. 40 tablet 1  ?? QUEtiapine (SEROQUEL) 25 MG tablet TAKE ONE TABLET BY MOUTH EVERY NIGHT AT BEDTIME 90 tablet 0  ? ?No current facility-administered medications for this visit.  ? ?Facility-Administered Medications Ordered in Other Visits  ?Medication Dose Route Frequency Provider Last Rate Last Admin  ?? 0.9 %  sodium chloride infusion   Intravenous Once Charlaine Dalton R, MD      ?? heparin lock flush 100 unit/mL  500 Units Intracatheter Once PRN Cammie Sickle, MD      ??  pembrolizumab (KEYTRUDA) 200 mg in sodium chloride 0.9 % 50 mL chemo infusion  200 mg Intravenous Once Cammie Sickle, MD      ?? sodium chloride flush (NS) 0.9 % injection 10 mL  10 mL Intracatheter PRN Cammie Sickle, MD      ? ? ?  ?. ? ?PHYSICAL EXAMINATION: ?ECOG PERFORMANCE STATUS: 3 - Symptomatic, >50% confined to bed ? ?Vitals:  ? 12/04/21 1248  ?BP: (!) 147/80  ?Pulse: 80  ?Resp: 18  ?SpO2: 100%  ? ?Filed Weights  ? 12/04/21 1246  ?Weight: 127 lb (57.6 kg)  ? ? ?Physical Exam ?HENT:  ?   Head: Normocephalic and atraumatic.  ?   Mouth/Throat:  ?   Pharynx: No oropharyngeal exudate.  ?Eyes:  ?   Pupils: Pupils are equal, round, and reactive to light.  ?Cardiovascular:  ?   Rate and Rhythm: Normal rate and regular rhythm.  ?Pulmonary:  ?   Effort: No respiratory distress.  ?   Breath sounds: No wheezing.  ?   Comments: Decreased air entry bilaterally. ?Abdominal:  ?   General: Bowel sounds are normal. There is no distension.  ?   Palpations: Abdomen is soft. There is no mass.  ?   Tenderness: There  is no abdominal tenderness. There is no guarding or rebound.  ?Musculoskeletal:     ?   General: No tenderness. Normal range of motion.  ?   Cervical back: Normal range of motion and neck supple.  ?Skin: ?

## 2021-12-04 NOTE — Assessment & Plan Note (Addendum)
#   Stage IV metastatic non-small cell lung cancer favor adeno; mets to bone; liver.  Currently on Keytruda maintenance-  Given the oligometastatic progression-s/p RT right upper lobe mass [until dec 15th, 2022]. FEB 9th, 2023-  Significant interval decrease in size of a previouslyvhypermetabolic soft tissue mass about the posterior right upperlobe; multiple treated liver metastases/bone metastases unchanged. STABLE.  ? ?# proceed with [place] maintainance today. Labs today reviewed;  acceptable for treatment today.  Will order imaging today.  ? ?# Bone metastases- on zometa 3 mg IVPB.  Every 6 weeks. Ca- 8.3; HOLD for now- pt awaiting possible dental extractions.  Patient understands that he will need a clearance.  He will call us. ? ?# Hypothyroidism- [ Mid sep 2022]  Normal T3-T4.  Currently on 50 mcg a day; April 2023-3.2- STABLE. ? ?# COPD-. On 2 lit O2; Trelegy.Clinically- STABLE.  ? ?  ZOMETA-? q 6W [onhold]; TSH ? ?# DISPOSITION:  ?#  Keytruda;  ?# follow up in 3 weeks- labs- cbc/cmp; Keytruda;Zometa; CT CAP prior- -Dr.B ? ? ? ? ?.  ? ?

## 2021-12-04 NOTE — Progress Notes (Signed)
Patient has no new concerns or questions today ?

## 2021-12-11 ENCOUNTER — Ambulatory Visit
Admission: RE | Admit: 2021-12-11 | Discharge: 2021-12-11 | Disposition: A | Discharge status: Home / Self Care | Payer: Medicare HMO | Source: Ambulatory Visit | Attending: Internal Medicine | Admitting: Internal Medicine

## 2021-12-11 ENCOUNTER — Other Ambulatory Visit: Payer: Self-pay

## 2021-12-11 DIAGNOSIS — N400 Enlarged prostate without lower urinary tract symptoms: Secondary | ICD-10-CM | POA: Diagnosis not present

## 2021-12-11 DIAGNOSIS — I2510 Atherosclerotic heart disease of native coronary artery without angina pectoris: Secondary | ICD-10-CM | POA: Diagnosis not present

## 2021-12-11 DIAGNOSIS — I358 Other nonrheumatic aortic valve disorders: Secondary | ICD-10-CM | POA: Diagnosis not present

## 2021-12-11 DIAGNOSIS — J432 Centrilobular emphysema: Secondary | ICD-10-CM | POA: Diagnosis not present

## 2021-12-11 DIAGNOSIS — C3490 Malignant neoplasm of unspecified part of unspecified bronchus or lung: Secondary | ICD-10-CM | POA: Diagnosis not present

## 2021-12-11 DIAGNOSIS — C3411 Malignant neoplasm of upper lobe, right bronchus or lung: Secondary | ICD-10-CM | POA: Insufficient documentation

## 2021-12-11 DIAGNOSIS — K828 Other specified diseases of gallbladder: Secondary | ICD-10-CM | POA: Diagnosis not present

## 2021-12-11 DIAGNOSIS — K429 Umbilical hernia without obstruction or gangrene: Secondary | ICD-10-CM | POA: Diagnosis not present

## 2021-12-11 MED ORDER — IOHEXOL 300 MG/ML  SOLN
80.0000 mL | Freq: Once | INTRAMUSCULAR | Status: AC | PRN
  Administered 2021-12-11: 80 mL via INTRAVENOUS

## 2021-12-15 ENCOUNTER — Other Ambulatory Visit: Payer: Self-pay | Admitting: Family Medicine

## 2021-12-15 DIAGNOSIS — G4700 Insomnia, unspecified: Secondary | ICD-10-CM

## 2021-12-15 DIAGNOSIS — F341 Dysthymic disorder: Secondary | ICD-10-CM

## 2021-12-15 DIAGNOSIS — F419 Anxiety disorder, unspecified: Secondary | ICD-10-CM

## 2021-12-17 ENCOUNTER — Other Ambulatory Visit: Payer: Self-pay | Admitting: *Deleted

## 2021-12-17 DIAGNOSIS — G4700 Insomnia, unspecified: Secondary | ICD-10-CM

## 2021-12-17 MED ORDER — QUETIAPINE FUMARATE 25 MG PO TABS: 25.0000 mg | ORAL_TABLET | Freq: Every day | ORAL | 0 refills | Status: DC

## 2021-12-17 NOTE — Telephone Encounter (Signed)
Called pt to inform him of the refill request and he will call his oncologist for this refill

## 2021-12-17 NOTE — Telephone Encounter (Signed)
Patient called stating PCP will not refill his Seroquel unless he is seen and told him to check to see if Dr b will refill this medicine for him. Please advise

## 2021-12-18 ENCOUNTER — Other Ambulatory Visit: Payer: Self-pay | Admitting: *Deleted

## 2021-12-18 DIAGNOSIS — F341 Dysthymic disorder: Secondary | ICD-10-CM

## 2021-12-18 DIAGNOSIS — F419 Anxiety disorder, unspecified: Secondary | ICD-10-CM

## 2021-12-18 MED ORDER — ESCITALOPRAM OXALATE 5 MG PO TABS: 5.0000 mg | ORAL_TABLET | Freq: Every morning | ORAL | 0 refills | Status: DC

## 2021-12-25 ENCOUNTER — Inpatient Hospital Stay: Payer: Medicare HMO | Admitting: Internal Medicine

## 2021-12-25 ENCOUNTER — Inpatient Hospital Stay: Payer: Medicare HMO

## 2021-12-25 DIAGNOSIS — Z7289 Other problems related to lifestyle: Secondary | ICD-10-CM | POA: Diagnosis not present

## 2021-12-25 DIAGNOSIS — Z7189 Other specified counseling: Secondary | ICD-10-CM

## 2021-12-25 DIAGNOSIS — M2550 Pain in unspecified joint: Secondary | ICD-10-CM | POA: Diagnosis not present

## 2021-12-25 DIAGNOSIS — Z823 Family history of stroke: Secondary | ICD-10-CM | POA: Diagnosis not present

## 2021-12-25 DIAGNOSIS — I700 Atherosclerosis of aorta: Secondary | ICD-10-CM | POA: Diagnosis not present

## 2021-12-25 DIAGNOSIS — R54 Age-related physical debility: Secondary | ICD-10-CM | POA: Diagnosis not present

## 2021-12-25 DIAGNOSIS — R0602 Shortness of breath: Secondary | ICD-10-CM | POA: Diagnosis not present

## 2021-12-25 DIAGNOSIS — Z79899 Other long term (current) drug therapy: Secondary | ICD-10-CM | POA: Diagnosis not present

## 2021-12-25 DIAGNOSIS — N400 Enlarged prostate without lower urinary tract symptoms: Secondary | ICD-10-CM | POA: Diagnosis not present

## 2021-12-25 DIAGNOSIS — M954 Acquired deformity of chest and rib: Secondary | ICD-10-CM | POA: Diagnosis not present

## 2021-12-25 DIAGNOSIS — C787 Secondary malignant neoplasm of liver and intrahepatic bile duct: Secondary | ICD-10-CM | POA: Diagnosis not present

## 2021-12-25 DIAGNOSIS — Z87891 Personal history of nicotine dependence: Secondary | ICD-10-CM | POA: Diagnosis not present

## 2021-12-25 DIAGNOSIS — C3411 Malignant neoplasm of upper lobe, right bronchus or lung: Secondary | ICD-10-CM

## 2021-12-25 DIAGNOSIS — Z801 Family history of malignant neoplasm of trachea, bronchus and lung: Secondary | ICD-10-CM | POA: Diagnosis not present

## 2021-12-25 DIAGNOSIS — Z923 Personal history of irradiation: Secondary | ICD-10-CM | POA: Diagnosis not present

## 2021-12-25 DIAGNOSIS — M47816 Spondylosis without myelopathy or radiculopathy, lumbar region: Secondary | ICD-10-CM | POA: Diagnosis not present

## 2021-12-25 DIAGNOSIS — M549 Dorsalgia, unspecified: Secondary | ICD-10-CM | POA: Diagnosis not present

## 2021-12-25 DIAGNOSIS — Z8249 Family history of ischemic heart disease and other diseases of the circulatory system: Secondary | ICD-10-CM | POA: Diagnosis not present

## 2021-12-25 DIAGNOSIS — C7951 Secondary malignant neoplasm of bone: Secondary | ICD-10-CM | POA: Diagnosis not present

## 2021-12-25 DIAGNOSIS — J432 Centrilobular emphysema: Secondary | ICD-10-CM | POA: Diagnosis not present

## 2021-12-25 DIAGNOSIS — E039 Hypothyroidism, unspecified: Secondary | ICD-10-CM | POA: Diagnosis not present

## 2021-12-25 DIAGNOSIS — Z5112 Encounter for antineoplastic immunotherapy: Secondary | ICD-10-CM | POA: Diagnosis not present

## 2021-12-25 LAB — COMPREHENSIVE METABOLIC PANEL
ALT: 13 U/L (ref 0–44)
AST: 23 U/L (ref 15–41)
Albumin: 4.1 g/dL (ref 3.5–5.0)
Alkaline Phosphatase: 55 U/L (ref 38–126)
Anion gap: 8 (ref 5–15)
BUN: 8 mg/dL (ref 8–23)
CO2: 38 mmol/L — ABNORMAL HIGH (ref 22–32)
Calcium: 8.9 mg/dL (ref 8.9–10.3)
Chloride: 88 mmol/L — ABNORMAL LOW (ref 98–111)
Creatinine, Ser: 0.57 mg/dL — ABNORMAL LOW (ref 0.61–1.24)
GFR, Estimated: >60 mL/min (ref >60)
Glucose, Bld: 101 mg/dL — ABNORMAL HIGH (ref 70–99)
Potassium: 4.3 mmol/L (ref 3.5–5.1)
Sodium: 134 mmol/L — ABNORMAL LOW (ref 135–145)
Total Bilirubin: 0.5 mg/dL (ref 0.3–1.2)
Total Protein: 7.7 g/dL (ref 6.5–8.1)

## 2021-12-25 LAB — CBC WITH DIFFERENTIAL/PLATELET
Abs Immature Granulocytes: 0.01 10*3/uL (ref 0.00–0.07)
Basophils Absolute: 0.1 10*3/uL (ref 0.0–0.1)
Basophils Relative: 1 %
Eosinophils Absolute: 0.4 10*3/uL (ref 0.0–0.5)
Eosinophils Relative: 6 %
HCT: 45 % (ref 39.0–52.0)
Hemoglobin: 14.6 g/dL (ref 13.0–17.0)
Immature Granulocytes: 0 %
Lymphocytes Relative: 15 %
Lymphs Abs: 1.1 10*3/uL (ref 0.7–4.0)
MCH: 31.4 pg (ref 26.0–34.0)
MCHC: 32.4 g/dL (ref 30.0–36.0)
MCV: 96.8 fL (ref 80.0–100.0)
Monocytes Absolute: 0.7 10*3/uL (ref 0.1–1.0)
Monocytes Relative: 10 %
Neutro Abs: 5 10*3/uL (ref 1.7–7.7)
Neutrophils Relative %: 68 %
Platelets: 163 10*3/uL (ref 150–400)
RBC: 4.65 MIL/uL (ref 4.22–5.81)
RDW: 12.6 % (ref 11.5–15.5)
WBC: 7.3 10*3/uL (ref 4.0–10.5)
nRBC: 0 % (ref 0.0–0.2)

## 2021-12-25 MED ORDER — SODIUM CHLORIDE 0.9 % IV SOLN
200.0000 mg | Freq: Once | INTRAVENOUS | Status: AC
  Administered 2021-12-25: 200 mg via INTRAVENOUS
  Filled 2021-12-25: qty 200

## 2021-12-25 MED ORDER — SODIUM CHLORIDE 0.9% FLUSH
10.0000 mL | Freq: Once | INTRAVENOUS | Status: AC
  Administered 2021-12-25: 10 mL via INTRAVENOUS
  Filled 2021-12-25: qty 10

## 2021-12-25 MED ORDER — SODIUM CHLORIDE 0.9 % IV SOLN
Freq: Once | INTRAVENOUS | Status: AC
  Filled 2021-12-25: qty 250

## 2021-12-25 MED ORDER — HEPARIN SOD (PORK) LOCK FLUSH 100 UNIT/ML IV SOLN
500.0000 [IU] | Freq: Once | INTRAVENOUS | Status: AC | PRN
  Administered 2021-12-25: 500 [IU]
  Filled 2021-12-25: qty 5

## 2021-12-25 MED ORDER — ZOLEDRONIC ACID 4 MG/5ML IV CONC
3.0000 mg | Freq: Once | INTRAVENOUS | Status: AC
  Administered 2021-12-25: 3 mg via INTRAVENOUS
  Filled 2021-12-25: qty 3.75

## 2021-12-25 NOTE — Assessment & Plan Note (Addendum)
#   Stage IV metastatic non-small cell lung cancer favor adeno; mets to bone; liver.  Currently on Keytruda maintenance-  Given the oligometastatic progression-s/p RT right upper lobe mass [until dec 15th, 2022]. MAY 18th, 2023- Stable appearance of the treated recurrence posteriorly in the right upper lobe. Stable underlying irregular sclerosis of the right 3rd, 4th and 5th ribs posteriorly and stable postsurgical changes in the upper thoracic spine;  No progressive disease or new metastases identified;  Stable treated hepatic metastatic disease.   # proceed with [place] maintainance today. Labs today reviewed;  acceptable for treatment today.   # Bone metastases- on zometa 3 mg IVPB.  Every 6 weeks. Ca-8.9.  Pt awaiting possible dental extractions.  Patient understands that he will need a clearance.  Zometa today. STABLE.  # Hypothyroidism- [ Mid sep 2022]  Normal T3-T4.  Currently on 50 mcg a day; April 2023-3.2-  STABLE.  # COPD-. On 2 lit O2; Trelegy.Clinically- STABLE.  #Incidental findings on Imaging MAY  2023. Stable moderate prostatomegaly; Coronary and aortic atherosclerosis; Emphysema  I reviewed/discussed/counseled the patient.     ZOMETA- last 4/19-; TSH  # DISPOSITION:  #  Keytruda; if possible Zometa today # follow up in 3 weeks- labs- cbc/cmp; Keytruda;. - -Dr.B     .

## 2021-12-25 NOTE — Progress Notes (Signed)
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   # 2021- MAY 26th-Keytrda x2 cycles; [borderline PS] July 7th-carbo Alimta Keytruda cycle #1  #November 2022-oligometastatic progression-right upper lobe subpleural involvement with; s/p radiation finished December 15.  Temporarily held [place].  #Jan fourth 2023-restarted Keytruda.  # NGS/MOLECULAR TESTS:   # PALLIATIVE CARE EVALUATION:  # PAIN MANAGEMENT:    DIAGNOSIS:   STAGE:         ;  GOALS:  CURRENT/MOST RECENT THERAPY :     Cancer of upper lobe of right lung ([place])  11/23/2019 Initial Diagnosis   Cancer of upper lobe of right lung ([facility])   12/21/2019 -  Chemotherapy   Patient is on Treatment Plan : LUNG CARBOplatin / Pemetrexed / Pembrolizumab q21d Induction x 4 cycles / Maintenance Pemetrexed + Pembrolizumab       HISTORY OF PRESENTING ILLNESS: Thin built frail appearing male patient.  No acute distress.  Alone.   Nasal cannula oxygen.  Cody [name] 66 y.o.  male patient with metastatic non-small cell lung cancer; cord compression status post decompressive surgery; currently on Keytruda maintenance-is here for follow-up/and review the results of the CT scan.  Patient denies any fevers or chills.  No nausea no vomiting.   Chronic mild fatigue.  No worsening shortness of breath or cough.  No diarrhea.  No rash.  Review of Systems  Constitutional:  Positive for malaise/fatigue. Negative for chills, diaphoresis and fever.  HENT:  Negative for nosebleeds and sore throat.   Eyes:  Negative for double vision.  Respiratory:  Positive for shortness of breath. Negative for cough, hemoptysis, sputum production and wheezing.   Cardiovascular:  Negative for chest pain, palpitations, orthopnea and leg swelling.  Gastrointestinal:  Negative for abdominal pain, blood in stool, constipation, heartburn, melena, nausea and vomiting.  Genitourinary:  Negative for dysuria, frequency and urgency.  Musculoskeletal:  Positive for back pain and joint pain.  Skin: Negative.  Negative for itching and rash.  Neurological:  Negative for dizziness, tingling, focal weakness, weakness and headaches.  Endo/Heme/Allergies:  Does not bruise/bleed easily.  Psychiatric/Behavioral:  Negative for depression. The patient is not nervous/anxious and does not have insomnia.     MEDICAL HISTORY:  Past Medical History:  Diagnosis Date  . Allergy   . Cancer of upper lobe of right lung ([place]) 10/2019  . COPD (chronic obstructive pulmonary disease) ([place])   . Prostate enlargement     SURGICAL HISTORY: Past Surgical History:  Procedure Laterality Date  . HERNIA REPAIR Bilateral 1982  . HERNIA REPAIR Right 1994  . IR IMAGING GUIDED PORT INSERTION  02/24/2020  . LAMINECTOMY FOR EXCISION / EVACUATION INTRASPINAL LESION  11/07/2019   T1-T 6 at [facility]     SOCIAL HISTORY: Social History   Socioeconomic History  . Marital status: Married    Spouse name: [name]   . Number of children:  4  . Years of education: Not on file  . Highest education level: Some college, no degree  Occupational History  . Occupation: dispatch and delivery   Tobacco Use  . Smoking status: Former    Packs/day: 2.00    Years: 30.00    Pack years: 60.00    Types:  Cigarettes    Quit date: 07/29/2011    Years since quitting: 10.4  . Smokeless tobacco: Never  . Tobacco comments:    pt vapes  Vaping Use  . Vaping Use: Not on file  Substance and Sexual Activity  . Alcohol use: Yes    Alcohol/week: 5.0 standard drinks    Types: 5 Standard drinks or equivalent per week    Comment: 1-2/day  . Drug use: No  . Sexual activity: Not Currently    Partners: Female  Other Topics Concern  . Not on file  Social History Narrative   Worked in warehouse/ quit smoking in 2015; beer 1/day; in [place]; with wife.    Social Determinants of Health   Financial Resource Strain: Not on file  Food Insecurity: Not on file  Transportation Needs: Not on file  Physical Activity: Not on file  Stress: Not on file  Social Connections: Not on file  Intimate Partner Violence: Not on file    FAMILY HISTORY: Family History  Problem Relation Age of Onset  . Heart disease Father   . CVA Mother   . Lung cancer Sister   . Lung cancer Brother     ALLERGIES:  has No Known Allergies.  MEDICATIONS:  Current Outpatient Medications  Medication Sig Dispense Refill  . acetaminophen (TYLENOL) 500 MG tablet Take 500-1,000 mg by mouth every 6 (six) hours as needed for mild pain or fever.     Marland Kitchen albuterol (VENTOLIN HFA) 108 (90 Base) MCG/ACT inhaler Inhale 2 puffs into the lungs every 4 (four) hours as needed for wheezing or shortness of breath. 18 g 0  . Calcium Carb-Cholecalciferol (OYSTER SHELL CALCIUM) 500-400 MG-UNIT TABS Take 500 tablets by mouth.    . escitalopram (LEXAPRO) 5 MG tablet Take 1 tablet (5 mg total) by mouth every morning. 90 tablet 0  . Fluticasone-Umeclidin-Vilant (TRELEGY ELLIPTA) 100-62.5-25 MCG/INH AEPB Inhale 1 puff into the lungs daily. 180 each 1  . levothyroxine (SYNTHROID) 50 MCG tablet Take 1 tablet (50 mcg total) by mouth daily before breakfast. 90 tablet 0  . lidocaine-prilocaine (EMLA) cream Apply 1 application topically as needed. Apply small  amount to port site at least 1 hour prior to it being accessed, cover with plastic wrap 30 g 1  . ondansetron (ZOFRAN) 4 MG tablet Take 1 tablet (4 mg total) by mouth every 8 (eight) hours as needed for nausea or vomiting. 20 tablet 2  . prochlorperazine (COMPAZINE) 10 MG tablet Take 1 tablet (10 mg total) by mouth every 6 (six) hours as needed for nausea or vomiting. 40 tablet 1  . QUEtiapine (SEROQUEL) 25 MG tablet Take 1 tablet (25 mg total) by mouth at bedtime. 90 tablet 0   No current facility-administered medications for this visit.   Facility-Administered Medications Ordered in Other Visits  Medication Dose Route Frequency Provider Last Rate Last Admin  . heparin lock flush 100 unit/mL  500 Units Intracatheter Once PRN Cammie Sickle, MD      . pembrolizumab [facility]) 200 mg in sodium chloride 0.9 % 50 mL chemo infusion  200 mg Intravenous Once Cammie Sickle, MD      .  zoledronic acid (ZOMETA) 3 mg in sodium chloride 0.9 % 100 mL IVPB  3 mg Intravenous Once Charlaine Dalton R, MD          .  PHYSICAL EXAMINATION: ECOG PERFORMANCE STATUS: 3 - Symptomatic, >50% confined to bed  Vitals:   12/25/21 1303  BP: 133/80  Pulse: 80  Temp: (!) 96.5 F (35.8 C)  SpO2: 96%   Filed Weights   12/25/21 1303  Weight: 123 lb 9.6 oz (56.1 kg)    Physical Exam HENT:     Head: Normocephalic and atraumatic.     Mouth/Throat:     Pharynx: No oropharyngeal exudate.  Eyes:     Pupils: Pupils are equal, round, and reactive to light.  Cardiovascular:     Rate and Rhythm: Normal rate and regular rhythm.  Pulmonary:     Effort: No respiratory distress.     Breath sounds: No wheezing.     Comments: Decreased air entry bilaterally. Abdominal:     General: Bowel sounds are normal. There is no distension.     Palpations: Abdomen is soft. There is no mass.     Tenderness: There is no abdominal tenderness. There is no guarding or rebound.  Musculoskeletal:        General: No  tenderness. Normal range of motion.     Cervical back: Normal range of motion and neck supple.  Skin:    General: Skin is warm.  Neurological:     Mental Status: He is alert and oriented to person, place, and time.  Psychiatric:        Mood and Affect: Affect normal.     LABORATORY DATA:  I have reviewed the data as listed Lab Results  Component Value Date   WBC 7.3 12/25/2021   HGB 14.6 12/25/2021   HCT 45.0 12/25/2021   MCV 96.8 12/25/2021   PLT 163 12/25/2021   Recent Labs    11/13/21 0833 12/04/21 1230 12/25/21 1238  NA 137 134* 134*  K 4.2 4.0 4.3  CL 91* 92* 88*  CO2 41* 37* 38*  GLUCOSE 101* 112* 101*  BUN _0 CREATININE 0.52* 0.48* 0.57*  CALCIUM 9.3 8.3* 8.9  GFRNONAA >60 >60 >60  PROT 7.6 7.5 7.7  ALBUMIN 4.0 4.0 4.1  AST _1 ALT _2 ALKPHOS 56 51 55  BILITOT 0.7 0.5 0.5    RADIOGRAPHIC STUDIES: I have personally reviewed the radiological images as listed and agreed with the findings in the report. CT CHEST ABDOMEN PELVIS W CONTRAST  Result Date: 12/12/2021 CLINICAL DATA:  Non-small cell lung cancer. Assess treatment response. * Tracking Code: BO * EXAM: CT CHEST, ABDOMEN, AND PELVIS WITH CONTRAST TECHNIQUE: Multidetector CT imaging of the chest, abdomen and pelvis was performed following the standard protocol during bolus administration of intravenous contrast. RADIATION DOSE REDUCTION: This exam was performed according to the departmental dose-optimization program which includes automated exposure control, adjustment of the mA and/or kV according to patient size and/or use of iterative reconstruction technique. CONTRAST:  31m OMNIPAQUE IOHEXOL 300 MG/ML  SOLN COMPARISON:  CTs 09/06/2021 and 05/17/2021.  PET-CT 05/28/2021. FINDINGS: CT CHEST FINDINGS Cardiovascular: Atherosclerosis of the aorta, great vessels and coronary arteries. Right IJ Port-A-Cath extends to the superior cavoatrial junction. No acute vascular findings. There are  calcifications of the aortic valve. The heart size is normal. There is no pericardial effusion. Mediastinum/Nodes: There are no enlarged mediastinal, hilar or axillary lymph nodes. The thyroid gland,  trachea and esophagus demonstrate no significant findings. Lungs/Pleura: No pleural effusion or pneumothorax. Severe centrilobular and paraseptal emphysema with diffuse central airway thickening and asymmetric right apical scarring. The treated hypermetabolic recurrence previously demonstrated posteriorly in the right upper lobe is unchanged without residual discrete measurable mass lesion. Subpleural right lower lobe nodule measuring 5 mm on image 83/9 is unchanged. No new or enlarging nodules are identified. Musculoskeletal/Chest wall: Intact hardware and stable alignment status post posterior decompression and fusion from T1 through T6. Stable chronic sclerotic pathologic fractures at T3 and T4. Stable underlying deformity and sclerosis of the right 3rd, 4th and 5th ribs posteriorly. No progressive bone destruction or recurrent soft tissue mass demonstrated. CT ABDOMEN AND PELVIS FINDINGS Hepatobiliary: The liver appears stable without suspicious findings. Treated hepatic metastases are unchanged. No evidence of gallstones, gallbladder wall thickening or biliary dilatation. Stable mild intrahepatic biliary dilatation adjacent to the gallbladder. Pancreas: Unremarkable. No pancreatic ductal dilatation or surrounding inflammatory changes. Spleen: Normal in size without focal abnormality. Adrenals/Urinary Tract: Both adrenal glands appear normal. The kidneys appear normal without evidence of urinary tract calculus, suspicious lesion or hydronephrosis. No bladder abnormalities are seen. Stomach/Bowel: Enteric contrast was administered and has passed into the distal colon. The stomach appears unremarkable for its degree of distension. No evidence of bowel wall thickening, distention or surrounding inflammatory change. The  appendix appears normal. Vascular/Lymphatic: There are no enlarged abdominal or pelvic lymph nodes. Aortic and branch vessel atherosclerosis without acute vascular findings or aneurysm. Reproductive: Stable heterogeneous enlargement of the prostate gland. Other: Right inguinal herniorrhaphy changes with stable 2.6 cm subcutaneous inclusion cyst. Tiny left periumbilical hernia containing only fat. No ascites or peritoneal nodularity. Musculoskeletal: No acute or significant osseous findings. Multilevel lumbar spondylosis. IMPRESSION: 1. Stable appearance of the treated recurrence posteriorly in the right upper lobe. Stable underlying irregular sclerosis of the right 3rd, 4th and 5th ribs posteriorly and stable postsurgical changes in the upper thoracic spine. 2. No progressive disease or new metastases identified. 3. Stable treated hepatic metastatic disease. 4. Stable moderate prostatomegaly. 5. Coronary and aortic atherosclerosis (ICD10-I70.0). Emphysema (ICD10-J43.9). Electronically Signed   By: Richardean Sale M.D.   On: 12/12/2021 14:42     ASSESSMENT & PLAN:   Cancer of upper lobe of right lung ([place]) # Stage IV metastatic non-small cell lung cancer favor adeno; mets to bone; liver.  Currently on Keytruda maintenance-  Given the oligometastatic progression-s/p RT right upper lobe mass [until dec 15th, 2022]. MAY 18th, 2023- Stable appearance of the treated recurrence posteriorly in the right upper lobe. Stable underlying irregular sclerosis of the right 3rd, 4th and 5th ribs posteriorly and stable postsurgical changes in the upper thoracic spine;  No progressive disease or new metastases identified;  Stable treated hepatic metastatic disease.   # proceed with [place] maintainance today. Labs today reviewed;  acceptable for treatment today.   # Bone metastases- on zometa 3 mg IVPB.  Every 6 weeks. Ca-8.9.  Pt awaiting possible dental extractions.  Patient understands that he will need a clearance.   Zometa today. STABLE.  # Hypothyroidism- [ Mid sep 2022]  Normal T3-T4.  Currently on 50 mcg a day; April 2023-3.2-  STABLE.  # COPD-. On 2 lit O2; Trelegy.Clinically- STABLE.  #Incidental findings on Imaging MAY  2023. Stable moderate prostatomegaly; Coronary and aortic atherosclerosis; Emphysema  I reviewed/discussed/counseled the patient.     ZOMETA- last 4/19-; TSH  # DISPOSITION:  #  Keytruda; if possible Zometa today # follow up in  3 weeks- labs- cbc/cmp; Keytruda;. - -Dr.B     .    All questions were answered. The patient knows to call the clinic with any problems, questions or concerns.    Cammie Sickle, MD 12/25/2021 1:37 PM

## 2021-12-25 NOTE — Patient Instructions (Signed)
[facility] AT [place]  Discharge Instructions: Thank you for choosing [facility] to provide your oncology and hematology care.  If you have a lab appointment with the [facility], please go directly to the [place] [place] [facility] and check in at the registration area.  Wear comfortable clothing and clothing appropriate for easy access to any Portacath or PICC line.   We strive to give you quality time with your provider. You may need to reschedule your appointment if you arrive late (15 or more minutes).  Arriving late affects you and other patients whose appointments are after yours.  Also, if you miss three or more appointments without notifying the office, you may be dismissed from the clinic at the provider's discretion.      For prescription refill requests, have your pharmacy contact our office and allow 72 hours for refills to be completed.    Today you received the following chemotherapy and/or immunotherapy agents: Keytruda      To help prevent nausea and vomiting after your treatment, we encourage you to take your nausea medication as directed.  BELOW ARE SYMPTOMS THAT SHOULD BE REPORTED IMMEDIATELY: *FEVER GREATER THAN 100.4 F (38 C) OR HIGHER *CHILLS OR SWEATING *NAUSEA AND VOMITING THAT IS NOT CONTROLLED WITH YOUR NAUSEA MEDICATION *UNUSUAL SHORTNESS OF BREATH *UNUSUAL BRUISING OR BLEEDING *URINARY PROBLEMS (pain or burning when urinating, or frequent urination) *BOWEL PROBLEMS (unusual diarrhea, constipation, pain near the anus) TENDERNESS IN MOUTH AND THROAT WITH OR WITHOUT PRESENCE OF ULCERS (sore throat, sores in mouth, or a toothache) UNUSUAL RASH, SWELLING OR PAIN  UNUSUAL VAGINAL DISCHARGE OR ITCHING   Items with * indicate a potential emergency and should be followed up as soon as possible or go to the Emergency Department if any problems should occur.  Please show the CHEMOTHERAPY ALERT CARD or IMMUNOTHERAPY ALERT CARD at check-in to  the Emergency Department and triage nurse.  Should you have questions after your visit or need to cancel or reschedule your appointment, please contact [facility] [place] [facility] [place]  [phone] and follow the prompts.  Office hours are 8:00 a.m. to 4:30 p.m. Monday - Friday. Please note that voicemails left after 4:00 p.m. may not be returned until the following business day.  We are closed weekends and major holidays. You have access to a nurse at all times for urgent questions. Please call the main number to the clinic [phone] and follow the prompts.  For any non-urgent questions, you may also contact your provider using MyChart. We now offer e-Visits for anyone 68 and older to request care online for non-urgent symptoms. For details visit [email or web address].   Also download the MyChart app! Go to the app store, search "MyChart", open the app, select [facility], and log in with your MyChart username and password.  Due to Covid, a mask is required upon entering the hospital/clinic. If you do not have a mask, one will be given to you upon arrival. For doctor visits, patients may have 1 support person aged 68 or older with them. For treatment visits, patients cannot have anyone with them due to current Covid guidelines and our immunocompromised population.

## 2021-12-26 ENCOUNTER — Other Ambulatory Visit: Payer: Self-pay | Admitting: Internal Medicine

## 2021-12-27 ENCOUNTER — Encounter: Payer: Self-pay | Admitting: Internal Medicine

## 2021-12-27 NOTE — Telephone Encounter (Signed)
Component Ref Range & Units 1 mo ago (11/13/21) 2 mo ago (10/02/21) 4 mo ago (07/31/21) 7 mo ago (05/20/21) 10 mo ago (02/25/21) 1 yr ago (12/03/20) 1 yr ago (12/03/20)  TSH 0.350 - 4.500 uIU/mL 3.325  5.440 High  R  5.510 High  R  6.150 High  R  9.320 High  R  5.690 High  R  6.390 High  CM   Comment: Performed by a 3rd Generation assay with a functional sensitivity of <=0.01 uIU/mL.  Performed at [facility], [address] [place], [place] [address]   Resulting Agency  [facility] CLIN LAB [facility] LAB [facility] CLIN LAB [place] CL[facility] LAB [place] [facility] LAB [place] [facility] CLIN LAB         Specimen Collected: 11/13/21 08:33 Last Resulted: 11/13/21 09:28

## 2021-12-30 DIAGNOSIS — J9601 Acute respiratory failure with hypoxia: Secondary | ICD-10-CM | POA: Diagnosis not present

## 2021-12-30 DIAGNOSIS — J441 Chronic obstructive pulmonary disease with (acute) exacerbation: Secondary | ICD-10-CM | POA: Diagnosis not present

## 2022-01-02 ENCOUNTER — Ambulatory Visit
Admission: RE | Admit: 2022-01-02 | Discharge: 2022-01-02 | Disposition: A | Discharge status: Home / Self Care | Payer: Medicare HMO | Source: Ambulatory Visit | Attending: Radiation Oncology | Admitting: Radiation Oncology

## 2022-01-02 VITALS — BP 146/69 | HR 82 | Temp 95.9°F | Resp 18 | Ht 69.0 in | Wt 126.1 lb

## 2022-01-02 DIAGNOSIS — Z08 Encounter for follow-up examination after completed treatment for malignant neoplasm: Secondary | ICD-10-CM | POA: Diagnosis not present

## 2022-01-02 DIAGNOSIS — Z923 Personal history of irradiation: Secondary | ICD-10-CM | POA: Diagnosis not present

## 2022-01-02 DIAGNOSIS — C7951 Secondary malignant neoplasm of bone: Secondary | ICD-10-CM | POA: Diagnosis not present

## 2022-01-02 DIAGNOSIS — C3411 Malignant neoplasm of upper lobe, right bronchus or lung: Secondary | ICD-10-CM

## 2022-01-02 DIAGNOSIS — C787 Secondary malignant neoplasm of liver and intrahepatic bile duct: Secondary | ICD-10-CM | POA: Diagnosis not present

## 2022-01-02 NOTE — Progress Notes (Signed)
Radiation Oncology Follow up Note  Name: Cody [name]   Date:   01/02/2022 MRN:  [id number] DOB: 1956/05/08    This 66 y.o. male presents to the clinic today for 84-month follow-up status post palliative radiation therapy to his chest and patient with known stage IV lung cancer with extensive metastatic disease including spine and liver.  He is currently on Keytruda maintenance.  He had a recent CT scan of his chest showing  REFERRING PROVIDER: Steele Sizer, MD  HPI: Patient is a 66 year old male now out 5 months having completed radiation therapy to his chest.  He received palliative radiation therapy to his spine and 21 for cord compression status post decompression.Marland Kitchen  He is currently on Keytruda maintenance tolerating well.  He had a recent CT scan showing stable appearance of the treated recurrence posterior in the right upper lobe.  No progressive disease or new metastatic disease identified.  He specifically denies cough hemoptysis or chest tightness he is on nasal oxygen.  COMPLICATIONS OF TREATMENT: none  FOLLOW UP COMPLIANCE: keeps appointments   PHYSICAL EXAM:  BP (!) 146/69   Pulse 82   Temp (!) 95.9 F (35.5 C)   Resp 18   Ht 5\' 9"  (1.753 m)   Wt 126 lb 1.6 oz (57.2 kg)   BMI 18.62 kg/m  Thin appearing male on nasal oxygen in NAD.  Well-developed well-nourished patient in NAD. HEENT reveals PERLA, EOMI, discs not visualized.  Oral cavity is clear. No oral mucosal lesions are identified. Neck is clear without evidence of cervical or supraclavicular adenopathy. Lungs are clear to A&P. Cardiac examination is essentially unremarkable with regular rate and rhythm without murmur rub or thrill. Abdomen is benign with no organomegaly or masses noted. Motor sensory and DTR levels are equal and symmetric in the upper and lower extremities. Cranial nerves II through XII are grossly intact. Proprioception is intact. No peripheral adenopathy or edema is identified. No motor or sensory  levels are noted. Crude visual fields are within normal range.  RADIOLOGY RESULTS: CT scans reviewed compatible with above-stated findings  PLAN: Present time patient is doing well with stable disease on recent CT scan of the chest.  He continues on maintenance Keytruda under medical oncology's direction.  I have asked to see him back in 6 months for follow-up.  Be happy to reevaluate the patient anytime should further palliative treatment indicated.  I would like to take this opportunity to thank you for allowing me to participate in the care of your patient.Noreene Filbert, MD

## 2022-01-03 ENCOUNTER — Other Ambulatory Visit: Payer: Self-pay | Admitting: Family Medicine

## 2022-01-03 MED ORDER — ALBUTEROL SULFATE HFA 108 (90 BASE) MCG/ACT IN AERS: 2.0000 | INHALATION_SPRAY | RESPIRATORY_TRACT | 0 refills | Status: DC | PRN

## 2022-01-15 ENCOUNTER — Encounter: Payer: Self-pay | Admitting: Internal Medicine

## 2022-01-15 ENCOUNTER — Inpatient Hospital Stay: Payer: Medicare HMO

## 2022-01-15 ENCOUNTER — Inpatient Hospital Stay: Payer: Medicare HMO | Attending: Internal Medicine | Admitting: Internal Medicine

## 2022-01-15 DIAGNOSIS — Z79899 Other long term (current) drug therapy: Secondary | ICD-10-CM | POA: Insufficient documentation

## 2022-01-15 DIAGNOSIS — M549 Dorsalgia, unspecified: Secondary | ICD-10-CM | POA: Insufficient documentation

## 2022-01-15 DIAGNOSIS — Z87891 Personal history of nicotine dependence: Secondary | ICD-10-CM | POA: Diagnosis not present

## 2022-01-15 DIAGNOSIS — Z923 Personal history of irradiation: Secondary | ICD-10-CM | POA: Diagnosis not present

## 2022-01-15 DIAGNOSIS — F17290 Nicotine dependence, other tobacco product, uncomplicated: Secondary | ICD-10-CM | POA: Diagnosis not present

## 2022-01-15 DIAGNOSIS — Z79890 Hormone replacement therapy (postmenopausal): Secondary | ICD-10-CM | POA: Diagnosis not present

## 2022-01-15 DIAGNOSIS — M2550 Pain in unspecified joint: Secondary | ICD-10-CM | POA: Diagnosis not present

## 2022-01-15 DIAGNOSIS — Z5112 Encounter for antineoplastic immunotherapy: Secondary | ICD-10-CM | POA: Diagnosis not present

## 2022-01-15 DIAGNOSIS — J449 Chronic obstructive pulmonary disease, unspecified: Secondary | ICD-10-CM | POA: Insufficient documentation

## 2022-01-15 DIAGNOSIS — C3411 Malignant neoplasm of upper lobe, right bronchus or lung: Secondary | ICD-10-CM

## 2022-01-15 DIAGNOSIS — E039 Hypothyroidism, unspecified: Secondary | ICD-10-CM | POA: Insufficient documentation

## 2022-01-15 DIAGNOSIS — R5383 Other fatigue: Secondary | ICD-10-CM | POA: Insufficient documentation

## 2022-01-15 DIAGNOSIS — R54 Age-related physical debility: Secondary | ICD-10-CM | POA: Insufficient documentation

## 2022-01-15 DIAGNOSIS — C787 Secondary malignant neoplasm of liver and intrahepatic bile duct: Secondary | ICD-10-CM | POA: Diagnosis not present

## 2022-01-15 DIAGNOSIS — C7951 Secondary malignant neoplasm of bone: Secondary | ICD-10-CM | POA: Diagnosis not present

## 2022-01-15 DIAGNOSIS — R0602 Shortness of breath: Secondary | ICD-10-CM | POA: Insufficient documentation

## 2022-01-15 DIAGNOSIS — Z8249 Family history of ischemic heart disease and other diseases of the circulatory system: Secondary | ICD-10-CM | POA: Diagnosis not present

## 2022-01-15 DIAGNOSIS — Z801 Family history of malignant neoplasm of trachea, bronchus and lung: Secondary | ICD-10-CM | POA: Insufficient documentation

## 2022-01-15 DIAGNOSIS — Z823 Family history of stroke: Secondary | ICD-10-CM | POA: Diagnosis not present

## 2022-01-15 DIAGNOSIS — Z7189 Other specified counseling: Secondary | ICD-10-CM

## 2022-01-15 LAB — CBC WITH DIFFERENTIAL/PLATELET
Abs Immature Granulocytes: 0.01 10*3/uL (ref 0.00–0.07)
Basophils Absolute: 0.1 10*3/uL (ref 0.0–0.1)
Basophils Relative: 1 %
Eosinophils Absolute: 0.5 10*3/uL (ref 0.0–0.5)
Eosinophils Relative: 9 %
HCT: 43.4 % (ref 39.0–52.0)
Hemoglobin: 13.7 g/dL (ref 13.0–17.0)
Immature Granulocytes: 0 %
Lymphocytes Relative: 20 %
Lymphs Abs: 1.1 10*3/uL (ref 0.7–4.0)
MCH: 31.3 pg (ref 26.0–34.0)
MCHC: 31.6 g/dL (ref 30.0–36.0)
MCV: 99.1 fL (ref 80.0–100.0)
Monocytes Absolute: 0.6 10*3/uL (ref 0.1–1.0)
Monocytes Relative: 12 %
Neutro Abs: 3 10*3/uL (ref 1.7–7.7)
Neutrophils Relative %: 58 %
Platelets: 155 10*3/uL (ref 150–400)
RBC: 4.38 MIL/uL (ref 4.22–5.81)
RDW: 12.5 % (ref 11.5–15.5)
WBC: 5.3 10*3/uL (ref 4.0–10.5)
nRBC: 0 % (ref 0.0–0.2)

## 2022-01-15 LAB — COMPREHENSIVE METABOLIC PANEL
ALT: 14 U/L (ref 0–44)
AST: 22 U/L (ref 15–41)
Albumin: 4 g/dL (ref 3.5–5.0)
Alkaline Phosphatase: 51 U/L (ref 38–126)
Anion gap: 8 (ref 5–15)
BUN: 7 mg/dL — ABNORMAL LOW (ref 8–23)
CO2: 40 mmol/L — ABNORMAL HIGH (ref 22–32)
Calcium: 9.1 mg/dL (ref 8.9–10.3)
Chloride: 89 mmol/L — ABNORMAL LOW (ref 98–111)
Creatinine, Ser: 0.52 mg/dL — ABNORMAL LOW (ref 0.61–1.24)
GFR, Estimated: >60 mL/min (ref >60)
Glucose, Bld: 103 mg/dL — ABNORMAL HIGH (ref 70–99)
Potassium: 4.2 mmol/L (ref 3.5–5.1)
Sodium: 137 mmol/L (ref 135–145)
Total Bilirubin: 0.6 mg/dL (ref 0.3–1.2)
Total Protein: 7.6 g/dL (ref 6.5–8.1)

## 2022-01-15 MED ORDER — SODIUM CHLORIDE 0.9 % IV SOLN
200.0000 mg | Freq: Once | INTRAVENOUS | Status: AC
  Administered 2022-01-15: 200 mg via INTRAVENOUS
  Filled 2022-01-15: qty 200

## 2022-01-15 MED ORDER — HEPARIN SOD (PORK) LOCK FLUSH 100 UNIT/ML IV SOLN
500.0000 [IU] | Freq: Once | INTRAVENOUS | Status: AC | PRN
  Administered 2022-01-15: 500 [IU]
  Filled 2022-01-15: qty 5

## 2022-01-15 MED ORDER — SODIUM CHLORIDE 0.9 % IV SOLN
Freq: Once | INTRAVENOUS | Status: AC
  Filled 2022-01-15: qty 250

## 2022-01-15 NOTE — Assessment & Plan Note (Signed)
#   Stage IV metastatic non-small cell lung cancer favor adeno; mets to bone; liver.  Currently on Keytruda maintenance-  Given the oligometastatic progression-s/p RT right upper lobe mass [until dec 15th, 2022]. MAY 18th, 2023- Stable appearance of the treated recurrence posteriorly in the right upper lobe. Stable underlying irregular sclerosis of the right 3rd, 4th and 5th ribs posteriorly and stable postsurgical changes in the upper thoracic spine;  No progressive disease or new metastases identified;  Stable treated hepatic metastatic disease. STABLE.  # proceed with [place] maintainance today. Labs today reviewed;  acceptable for treatment today.   # Bone metastases- on zometa 3 mg IVPB.  Every 6 weeks; will switch to q 3 M. Ca-8.9.  Pt awaiting possible dental extractions.  Patient understands that he will need a clearance.  Zometa today. STABLE.  # Hypothyroidism- [ Mid sep 2022]  Normal T3-T4.  Currently on 50 mcg a day; April 2023-3.2-  STABLE.  # COPD-. On 2-3 lit O2; Trelegy.Clinically- STABLE.    ZOMETA- last 5/31-q 35M;   # DISPOSITION:  #  Keytruda; # follow up in 3 weeks- labs- cbc/cmp; Keytruda;. - -Dr.B     .

## 2022-01-15 NOTE — Patient Instructions (Signed)
[facility] CANCER CTR AT [place]  Discharge Instructions: Thank you for choosing [facility] to provide your oncology and hematology care.   If you have a lab appointment with the [facility], please go directly to the [facility] and check in at the registration area.   Wear comfortable clothing and clothing appropriate for easy access to any Portacath or PICC line.   We strive to give you quality time with your provider. You may need to reschedule your appointment if you arrive late (15 or more minutes).  Arriving late affects you and other patients whose appointments are after yours.  Also, if you miss three or more appointments without notifying the office, you may be dismissed from the clinic at the provider's discretion.      For prescription refill requests, have your pharmacy contact our office and allow 72 hours for refills to be completed.    Today you received the following chemotherapy and/or immunotherapy agents       To help prevent nausea and vomiting after your treatment, we encourage you to take your nausea medication as directed.  BELOW ARE SYMPTOMS THAT SHOULD BE REPORTED IMMEDIATELY: *FEVER GREATER THAN 100.4 F (38 C) OR HIGHER *CHILLS OR SWEATING *NAUSEA AND VOMITING THAT IS NOT CONTROLLED WITH YOUR NAUSEA MEDICATION *UNUSUAL SHORTNESS OF BREATH *UNUSUAL BRUISING OR BLEEDING *URINARY PROBLEMS (pain or burning when urinating, or frequent urination) *BOWEL PROBLEMS (unusual diarrhea, constipation, pain near the anus) TENDERNESS IN MOUTH AND THROAT WITH OR WITHOUT PRESENCE OF ULCERS (sore throat, sores in mouth, or a toothache) UNUSUAL RASH, SWELLING OR PAIN  UNUSUAL VAGINAL DISCHARGE OR ITCHING   Items with * indicate a potential emergency and should be followed up as soon as possible or go to the Emergency Department if any problems should occur.  Please show the CHEMOTHERAPY ALERT CARD or IMMUNOTHERAPY ALERT CARD at check-in to the  Emergency Department and triage nurse.  Should you have questions after your visit or need to cancel or reschedule your appointment, please contact [facility] AT [place]  Dept: [phone]  and follow the prompts.  Office hours are 8:00 a.m. to 4:30 p.m. Monday - Friday. Please note that voicemails left after 4:00 p.m. may not be returned until the following business day.  We are closed weekends and major holidays. You have access to a nurse at all times for urgent questions. Please call the main number to the clinic Dept: [phone] and follow the prompts.   For any non-urgent questions, you may also contact your provider using MyChart. We now offer e-Visits for anyone 34 and older to request care online for non-urgent symptoms. For details visit [email or web address].   Also download the MyChart app! Go to the app store, search "MyChart", open the app, select [facility], and log in with your MyChart username and password.  Masks are optional in the cancer centers. If you would like for your care team to wear a mask while they are taking care of you, please let them know. For doctor visits, patients may have with them one support person who is at least 68 years old. At this time, visitors are not allowed in the infusion area.

## 2022-01-15 NOTE — Progress Notes (Signed)
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   # 2021- MAY 26th-Keytrda x2 cycles; [borderline PS] July 7th-carbo Alimta Keytruda cycle #1  #November 2022-oligometastatic progression-right upper lobe subpleural involvement with; s/p radiation finished December 15.  Temporarily held [place].  #Jan fourth 2023-restarted Keytruda.  # NGS/MOLECULAR TESTS:   # PALLIATIVE CARE EVALUATION:  # PAIN MANAGEMENT:    DIAGNOSIS:   STAGE:         ;  GOALS:  CURRENT/MOST RECENT THERAPY :     Cancer of upper lobe of right lung ([place])  11/23/2019 Initial Diagnosis   Cancer of upper lobe of right lung ([place])   12/21/2019 -  Chemotherapy   Patient is on Treatment Plan : LUNG CARBOplatin / Pemetrexed / Pembrolizumab q21d Induction x 4 cycles / Maintenance Pemetrexed + Pembrolizumab      HISTORY OF PRESENTING ILLNESS: Thin built frail appearing male patient.  No acute distress.  Alone.   Nasal cannula oxygen.  [name] 66 y.o.  male patient with metastatic non-small cell lung cancer; cord compression status post decompressive surgery; currently on Keytruda maintenance-is here for follow-up.  No diarrhea.  No nausea no vomiting no rash.  No worsening shortness of breath.   Review of  Systems  Constitutional:  Positive for malaise/fatigue. Negative for chills, diaphoresis and fever.  HENT:  Negative for nosebleeds and sore throat.   Eyes:  Negative for double vision.  Respiratory:  Positive for shortness of breath. Negative for cough, hemoptysis, sputum production and wheezing.   Cardiovascular:  Negative for chest pain, palpitations, orthopnea and leg swelling.  Gastrointestinal:  Negative for abdominal pain, blood in stool, constipation, heartburn, melena, nausea and vomiting.  Genitourinary:  Negative for dysuria, frequency and urgency.  Musculoskeletal:  Positive for back pain and joint pain.  Skin: Negative.  Negative for itching and rash.  Neurological:  Negative for dizziness, tingling, focal weakness, weakness and headaches.  Endo/Heme/Allergies:  Does not bruise/bleed easily.  Psychiatric/Behavioral:  Negative for depression. The patient is not nervous/anxious and does not have insomnia.      MEDICAL HISTORY:  Past Medical History:  Diagnosis Date   Allergy    Cancer of upper lobe of right lung ([place]) 10/2019   COPD (chronic obstructive pulmonary disease) (HCC)    Prostate enlargement     SURGICAL HISTORY: Past Surgical History:  Procedure Laterality Date   HERNIA REPAIR Bilateral 1982   HERNIA REPAIR Right 1994   IR IMAGING GUIDED PORT INSERTION  02/24/2020   LAMINECTOMY FOR EXCISION / EVACUATION INTRASPINAL LESION  11/07/2019   T1-T 6 at [facility]     SOCIAL HISTORY: Social History   Socioeconomic History   Marital status: Married    Spouse name: [name]    Number of children: 4   Years of education: Not on file   Highest education level: Some college, no degree  Occupational  History   Occupation: dispatch and delivery   Tobacco Use   Smoking status: Former    Packs/day: 2.00    Years: 30.00    Total pack years: 60.00    Types: Cigarettes    Quit date: 07/29/2011    Years since quitting: 10.4   Smokeless tobacco: Never   Tobacco comments:     pt vapes  Vaping Use   Vaping Use: Not on file  Substance and Sexual Activity   Alcohol use: Yes    Alcohol/week: 5.0 standard drinks of alcohol    Types: 5 Standard drinks or equivalent per week    Comment: 1-2/day   Drug use: No   Sexual activity: Not Currently    Partners: Female  Other Topics Concern   Not on file  Social History Narrative   Worked in warehouse/ quit smoking in 2015; beer 1/day; in [place]; with wife.    Social Determinants of Health   Financial Resource Strain: Low Risk  (10/21/2019)   Overall Financial Resource Strain (CARDIA)    Difficulty of Paying Living Expenses: Not hard at all  Food Insecurity: No Food Insecurity (10/21/2019)   Hunger Vital Sign    Worried About Running Out of Food in the Last Year: Never true    Ran Out of Food in the Last Year: Never true  Transportation Needs: No Transportation Needs (10/21/2019)   PRAPARE - Hydrologist (Medical): No    Lack of Transportation (Non-Medical): No  Physical Activity: Insufficiently Active (11/25/2019)   Exercise Vital Sign    Days of Exercise per Week: 7 days    Minutes of Exercise per Session: 20 min  Stress: No Stress Concern Present (10/21/2019)   Angels    Feeling of Stress : Not at all  Social Connections: [place] (10/21/2019)   Social Connection and Isolation Panel [NHANES]    Frequency of Communication with Friends and Family: More than three times a week    Frequency of Social Gatherings with Friends and Family: More than three times a week    Attends Religious Services: More than 4 times per year    Active Member of [employer] or Organizations: Yes    Attends Music therapist: More than 4 times per year    Marital Status: Married  Human resources officer Violence: Not At Risk (10/21/2019)   Humiliation, Afraid, Rape, and Kick questionnaire    Fear of Current or Ex-Partner: No     Emotionally Abused: No    Physically Abused: No    Sexually Abused: No    FAMILY HISTORY: Family History  Problem Relation Age of Onset   Heart disease Father    CVA Mother    Lung cancer Sister    Lung cancer Brother     ALLERGIES:  has No Known Allergies.  MEDICATIONS:  Current Outpatient Medications  Medication Sig Dispense Refill   acetaminophen (TYLENOL) 500 MG tablet Take 500-1,000 mg by mouth every 6 (six) hours as needed for mild pain or fever.      albuterol (VENTOLIN HFA) 108 (90 Base) MCG/ACT inhaler Inhale 2 puffs into the lungs every 4 (four) hours as needed for wheezing or shortness of breath. 18 g 0   Calcium Carb-Cholecalciferol (OYSTER SHELL CALCIUM) 500-400 MG-UNIT TABS Take 500 tablets by mouth.     escitalopram (LEXAPRO) 5 MG tablet Take 1 tablet (5 mg total) by mouth every morning. 90 tablet 0  Fluticasone-Umeclidin-Vilant (TRELEGY ELLIPTA) 100-62.5-25 MCG/INH AEPB Inhale 1 puff into the lungs daily. 180 each 1   levothyroxine (SYNTHROID) 50 MCG tablet TAKE ONE TABLET BY MOUTH DAILY BEFORE BREAKFAST 90 tablet 2   lidocaine-prilocaine (EMLA) cream Apply 1 application topically as needed. Apply small amount to port site at least 1 hour prior to it being accessed, cover with plastic wrap 30 g 1   Multiple Vitamin (MULTIVITAMIN) tablet Take 1 tablet by mouth daily.     QUEtiapine (SEROQUEL) 25 MG tablet Take 1 tablet (25 mg total) by mouth at bedtime. 90 tablet 0   No current facility-administered medications for this visit.      Marland Kitchen  PHYSICAL EXAMINATION: ECOG PERFORMANCE STATUS: 3 - Symptomatic, >50% confined to bed  Vitals:   01/15/22 0903  BP: (!) 148/80  Pulse: 83  Resp: 16  Temp: (!) 96.4 F (35.8 C)  SpO2: 96%   Filed Weights   01/15/22 0903  Weight: 125 lb (56.7 kg)    Physical Exam HENT:     Head: Normocephalic and atraumatic.     Mouth/Throat:     Pharynx: No oropharyngeal exudate.  Eyes:     Pupils: Pupils are equal, round, and  reactive to light.  Cardiovascular:     Rate and Rhythm: Normal rate and regular rhythm.  Pulmonary:     Effort: No respiratory distress.     Breath sounds: No wheezing.     Comments: Decreased air entry bilaterally. Abdominal:     General: Bowel sounds are normal. There is no distension.     Palpations: Abdomen is soft. There is no mass.     Tenderness: There is no abdominal tenderness. There is no guarding or rebound.  Musculoskeletal:        General: No tenderness. Normal range of motion.     Cervical back: Normal range of motion and neck supple.  Skin:    General: Skin is warm.  Neurological:     Mental Status: He is alert and oriented to person, place, and time.  Psychiatric:        Mood and Affect: Affect normal.      LABORATORY DATA:  I have reviewed the data as listed Lab Results  Component Value Date   WBC 5.3 01/15/2022   HGB 13.7 01/15/2022   HCT 43.4 01/15/2022   MCV 99.1 01/15/2022   PLT 155 01/15/2022   Recent Labs    11/13/21 0833 12/04/21 1230 12/25/21 1238  NA 137 134* 134*  K 4.2 4.0 4.3  CL 91* 92* 88*  CO2 41* 37* 38*  GLUCOSE 101* 112* 101*  BUN 8 8 8   CREATININE 0.52* 0.48* 0.57*  CALCIUM 9.3 8.3* 8.9  GFRNONAA >60 >60 >60  PROT 7.6 7.5 7.7  ALBUMIN 4.0 4.0 4.1  AST 22 22 23   ALT 15 14 13   ALKPHOS 56 51 55  BILITOT 0.7 0.5 0.5    RADIOGRAPHIC STUDIES: I have personally reviewed the radiological images as listed and agreed with the findings in the report. No results found.   ASSESSMENT & PLAN:   Cancer of upper lobe of right lung ([place]) # Stage IV metastatic non-small cell lung cancer favor adeno; mets to bone; liver.  Currently on Keytruda maintenance-  Given the oligometastatic progression-s/p RT right upper lobe mass [until dec 15th, 2022]. MAY 18th, 2023- Stable appearance of the treated recurrence posteriorly in the right upper lobe. Stable underlying irregular sclerosis of the right 3rd, 4th and 5th ribs posteriorly  and stable  postsurgical changes in the upper thoracic spine;  No progressive disease or new metastases identified;  Stable treated hepatic metastatic disease. STABLE.  # proceed with [place] maintainance today. Labs today reviewed;  acceptable for treatment today.   # Bone metastases- on zometa 3 mg IVPB.  Every 6 weeks; will switch to q 3 M. Ca-8.9.  Pt awaiting possible dental extractions.  Patient understands that he will need a clearance.  Zometa today. STABLE.  # Hypothyroidism- [ Mid sep 2022]  Normal T3-T4.  Currently on 50 mcg a day; April 2023-3.2-  STABLE.  # COPD-. On 2-3 lit O2; Trelegy.Clinically- STABLE.    ZOMETA- last 5/31-q 49M;   # DISPOSITION:  #  Keytruda; # follow up in 3 weeks- labs- cbc/cmp; Keytruda;. - -Dr.B     .    All questions were answered. The patient knows to call the clinic with any problems, questions or concerns.    Cammie Sickle, MD 01/15/2022 9:20 AM

## 2022-01-29 DIAGNOSIS — J441 Chronic obstructive pulmonary disease with (acute) exacerbation: Secondary | ICD-10-CM | POA: Diagnosis not present

## 2022-01-29 DIAGNOSIS — J9601 Acute respiratory failure with hypoxia: Secondary | ICD-10-CM | POA: Diagnosis not present

## 2022-02-04 ENCOUNTER — Other Ambulatory Visit: Payer: Self-pay

## 2022-02-04 DIAGNOSIS — C3411 Malignant neoplasm of upper lobe, right bronchus or lung: Secondary | ICD-10-CM

## 2022-02-05 ENCOUNTER — Other Ambulatory Visit: Payer: Self-pay

## 2022-02-05 ENCOUNTER — Inpatient Hospital Stay (HOSPITAL_BASED_OUTPATIENT_CLINIC_OR_DEPARTMENT_OTHER): Payer: Medicare HMO | Admitting: Internal Medicine

## 2022-02-05 ENCOUNTER — Inpatient Hospital Stay: Payer: Medicare HMO

## 2022-02-05 ENCOUNTER — Encounter: Payer: Self-pay | Admitting: Internal Medicine

## 2022-02-05 ENCOUNTER — Inpatient Hospital Stay: Payer: Medicare HMO | Attending: Internal Medicine

## 2022-02-05 DIAGNOSIS — M549 Dorsalgia, unspecified: Secondary | ICD-10-CM | POA: Insufficient documentation

## 2022-02-05 DIAGNOSIS — R54 Age-related physical debility: Secondary | ICD-10-CM | POA: Insufficient documentation

## 2022-02-05 DIAGNOSIS — C3411 Malignant neoplasm of upper lobe, right bronchus or lung: Secondary | ICD-10-CM | POA: Diagnosis not present

## 2022-02-05 DIAGNOSIS — M2550 Pain in unspecified joint: Secondary | ICD-10-CM | POA: Insufficient documentation

## 2022-02-05 DIAGNOSIS — C787 Secondary malignant neoplasm of liver and intrahepatic bile duct: Secondary | ICD-10-CM | POA: Diagnosis not present

## 2022-02-05 DIAGNOSIS — Z5112 Encounter for antineoplastic immunotherapy: Secondary | ICD-10-CM | POA: Diagnosis not present

## 2022-02-05 DIAGNOSIS — Z8249 Family history of ischemic heart disease and other diseases of the circulatory system: Secondary | ICD-10-CM | POA: Diagnosis not present

## 2022-02-05 DIAGNOSIS — C7951 Secondary malignant neoplasm of bone: Secondary | ICD-10-CM | POA: Diagnosis not present

## 2022-02-05 DIAGNOSIS — Z801 Family history of malignant neoplasm of trachea, bronchus and lung: Secondary | ICD-10-CM | POA: Diagnosis not present

## 2022-02-05 DIAGNOSIS — Z79890 Hormone replacement therapy (postmenopausal): Secondary | ICD-10-CM | POA: Insufficient documentation

## 2022-02-05 DIAGNOSIS — E039 Hypothyroidism, unspecified: Secondary | ICD-10-CM | POA: Insufficient documentation

## 2022-02-05 DIAGNOSIS — Z823 Family history of stroke: Secondary | ICD-10-CM | POA: Diagnosis not present

## 2022-02-05 DIAGNOSIS — R0602 Shortness of breath: Secondary | ICD-10-CM | POA: Diagnosis not present

## 2022-02-05 DIAGNOSIS — Z79899 Other long term (current) drug therapy: Secondary | ICD-10-CM | POA: Diagnosis not present

## 2022-02-05 DIAGNOSIS — Z7189 Other specified counseling: Secondary | ICD-10-CM

## 2022-02-05 DIAGNOSIS — Z923 Personal history of irradiation: Secondary | ICD-10-CM | POA: Insufficient documentation

## 2022-02-05 DIAGNOSIS — Z87891 Personal history of nicotine dependence: Secondary | ICD-10-CM | POA: Diagnosis not present

## 2022-02-05 DIAGNOSIS — R5383 Other fatigue: Secondary | ICD-10-CM | POA: Insufficient documentation

## 2022-02-05 LAB — COMPREHENSIVE METABOLIC PANEL
ALT: 13 U/L (ref 0–44)
AST: 20 U/L (ref 15–41)
Albumin: 4 g/dL (ref 3.5–5.0)
Alkaline Phosphatase: 53 U/L (ref 38–126)
Anion gap: 6 (ref 5–15)
BUN: 8 mg/dL (ref 8–23)
CO2: 42 mmol/L — ABNORMAL HIGH (ref 22–32)
Calcium: 9.1 mg/dL (ref 8.9–10.3)
Chloride: 91 mmol/L — ABNORMAL LOW (ref 98–111)
Creatinine, Ser: 0.5 mg/dL — ABNORMAL LOW (ref 0.61–1.24)
GFR, Estimated: >60 mL/min (ref >60)
Glucose, Bld: 105 mg/dL — ABNORMAL HIGH (ref 70–99)
Potassium: 3.9 mmol/L (ref 3.5–5.1)
Sodium: 139 mmol/L (ref 135–145)
Total Bilirubin: 0.6 mg/dL (ref 0.3–1.2)
Total Protein: 7.2 g/dL (ref 6.5–8.1)

## 2022-02-05 LAB — CBC WITH DIFFERENTIAL/PLATELET
Abs Immature Granulocytes: 0.01 10*3/uL (ref 0.00–0.07)
Basophils Absolute: 0 10*3/uL (ref 0.0–0.1)
Basophils Relative: 1 %
Eosinophils Absolute: 0.6 10*3/uL — ABNORMAL HIGH (ref 0.0–0.5)
Eosinophils Relative: 11 %
HCT: 43.8 % (ref 39.0–52.0)
Hemoglobin: 13.6 g/dL (ref 13.0–17.0)
Immature Granulocytes: 0 %
Lymphocytes Relative: 18 %
Lymphs Abs: 1 10*3/uL (ref 0.7–4.0)
MCH: 30.8 pg (ref 26.0–34.0)
MCHC: 31.1 g/dL (ref 30.0–36.0)
MCV: 99.3 fL (ref 80.0–100.0)
Monocytes Absolute: 0.7 10*3/uL (ref 0.1–1.0)
Monocytes Relative: 13 %
Neutro Abs: 3.2 10*3/uL (ref 1.7–7.7)
Neutrophils Relative %: 57 %
Platelets: 156 10*3/uL (ref 150–400)
RBC: 4.41 MIL/uL (ref 4.22–5.81)
RDW: 12.1 % (ref 11.5–15.5)
WBC: 5.5 10*3/uL (ref 4.0–10.5)
nRBC: 0 % (ref 0.0–0.2)

## 2022-02-05 LAB — TSH: TSH: 4.351 u[IU]/mL (ref 0.350–4.500)

## 2022-02-05 MED ORDER — HEPARIN SOD (PORK) LOCK FLUSH 100 UNIT/ML IV SOLN
INTRAVENOUS | Status: AC
  Filled 2022-02-05: qty 5

## 2022-02-05 MED ORDER — HEPARIN SOD (PORK) LOCK FLUSH 100 UNIT/ML IV SOLN
500.0000 [IU] | Freq: Once | INTRAVENOUS | Status: AC | PRN
  Administered 2022-02-05: 500 [IU]
  Filled 2022-02-05: qty 5

## 2022-02-05 MED ORDER — SODIUM CHLORIDE 0.9 % IV SOLN
Freq: Once | INTRAVENOUS | Status: AC
  Filled 2022-02-05: qty 250

## 2022-02-05 MED ORDER — SODIUM CHLORIDE 0.9% FLUSH
10.0000 mL | Freq: Once | INTRAVENOUS | Status: AC
  Administered 2022-02-05: 10 mL via INTRAVENOUS
  Filled 2022-02-05: qty 10

## 2022-02-05 MED ORDER — SODIUM CHLORIDE 0.9 % IV SOLN
200.0000 mg | Freq: Once | INTRAVENOUS | Status: AC
  Administered 2022-02-05: 200 mg via INTRAVENOUS
  Filled 2022-02-05: qty 8

## 2022-02-05 MED ORDER — HEPARIN SOD (PORK) LOCK FLUSH 100 UNIT/ML IV SOLN
500.0000 [IU] | Freq: Once | INTRAVENOUS | Status: AC
  Filled 2022-02-05: qty 5

## 2022-02-05 NOTE — Assessment & Plan Note (Addendum)
#   Stage IV metastatic non-small cell lung cancer favor adeno; mets to bone; liver.  Currently on Keytruda maintenance-  Given the oligometastatic progression-s/p RT right upper lobe mass [until dec 15th, 2022]. MAY 18th, 2023- Stable appearance of the treated recurrence posteriorly in the right upper lobe. Stable underlying irregular sclerosis of the right 3rd, 4th and 5th ribs posteriorly and stable postsurgical changes in the upper thoracic spine;  No progressive disease or new metastases identified;  Stable treated hepatic metastatic disease. STABLE.   # proceed with [place] maintainance today. Labs today reviewed;  acceptable for treatment today. Will order CT scan next visit. Discussed with pt- in agreement.   # Bone metastases- on zometa 3 mg IVPB.  Currently q 3 M. Ca-8.9.  Pt awaiting possible dental extractions.  Patient understands that he will need a clearance from oncology prior to dental extractions.   # Hypothyroidism- [ Mid sep 2022]  Normal T3-T4.  Currently on 50 mcg a day; April 2023-3.2-  STABLE.  Awaiting TSH from today.  # COPD-. On 2-3 lit O2; Trelegy.Clinically- STABLE.    ZOMETA- last 5/31-q 23M;   # DISPOSITION:  # ADD TSH today #  Keytruda; # follow up in 3 weeks- labs- cbc/cmp; Keytruda;. - -Dr.B     .

## 2022-02-05 NOTE — Progress Notes (Signed)
Pt in for follow up appt and treatment today.  Denies any concerns.

## 2022-02-05 NOTE — Progress Notes (Signed)
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   # 2021- MAY 26th-Keytrda x2 cycles; [borderline PS] July 7th-carbo Alimta Keytruda cycle #1  #November 2022-oligometastatic progression-right upper lobe subpleural involvement with; s/p radiation finished December 15.  Temporarily held [place].  #Jan fourth 2023-restarted Keytruda.  # NGS/MOLECULAR TESTS:   # PALLIATIVE CARE EVALUATION:  # PAIN MANAGEMENT:    DIAGNOSIS:   STAGE:         ;  GOALS:  CURRENT/MOST RECENT THERAPY :     Cancer of upper lobe of right lung ([place])  11/23/2019 Initial Diagnosis   Cancer of upper lobe of right lung ([place])   12/21/2019 -  Chemotherapy   Patient is on Treatment Plan : LUNG CARBOplatin / Pemetrexed / Pembrolizumab q21d Induction x 4 cycles / Maintenance Pemetrexed + Pembrolizumab      HISTORY OF PRESENTING ILLNESS: Thin built frail appearing male patient.  No acute distress.  Alone.   Nasal cannula oxygen.  Ambulating independently.  Alone.  [name] 66 y.o.  male patient with metastatic non-small cell lung cancer; cord compression status post decompressive surgery; currently on Keytruda maintenance-is here for follow-up.  No diarrhea.  No nausea no vomiting no rash.  No worsening  shortness of breath.   Review of Systems  Constitutional:  Positive for malaise/fatigue. Negative for chills, diaphoresis and fever.  HENT:  Negative for nosebleeds and sore throat.   Eyes:  Negative for double vision.  Respiratory:  Positive for shortness of breath. Negative for cough, hemoptysis, sputum production and wheezing.   Cardiovascular:  Negative for chest pain, palpitations, orthopnea and leg swelling.  Gastrointestinal:  Negative for abdominal pain, blood in stool, constipation, heartburn, melena, nausea and vomiting.  Genitourinary:  Negative for dysuria, frequency and urgency.  Musculoskeletal:  Positive for back pain and joint pain.  Skin: Negative.  Negative for itching and rash.  Neurological:  Negative for dizziness, tingling, focal weakness, weakness and headaches.  Endo/Heme/Allergies:  Does not bruise/bleed easily.  Psychiatric/Behavioral:  Negative for depression. The patient is not nervous/anxious and does not have insomnia.      MEDICAL HISTORY:  Past Medical History:  Diagnosis Date   Allergy    Cancer of upper lobe of right lung ([place]) 10/2019   COPD (chronic obstructive pulmonary disease) (HCC)    Prostate enlargement     SURGICAL HISTORY: Past Surgical History:  Procedure Laterality Date   HERNIA REPAIR Bilateral 1982   HERNIA REPAIR Right 1994   IR IMAGING GUIDED PORT INSERTION  02/24/2020   LAMINECTOMY FOR EXCISION / EVACUATION INTRASPINAL LESION  11/07/2019   T1-T 6 at [facility]     SOCIAL HISTORY: Social History   Socioeconomic History   Marital status: Married    Spouse name: [name]    Number of children: 4   Years of education: Not on file   Highest education level: Some  college, no degree  Occupational History   Occupation: dispatch and delivery   Tobacco Use   Smoking status: Former    Packs/day: 2.00    Years: 30.00    Total pack years: 60.00    Types: Cigarettes    Quit date: 07/29/2011    Years since quitting: 10.5   Smokeless  tobacco: Never   Tobacco comments:    pt vapes  Vaping Use   Vaping Use: Not on file  Substance and Sexual Activity   Alcohol use: Yes    Alcohol/week: 5.0 standard drinks of alcohol    Types: 5 Standard drinks or equivalent per week    Comment: 1-2/day   Drug use: No   Sexual activity: Not Currently    Partners: Female  Other Topics Concern   Not on file  Social History Narrative   Worked in warehouse/ quit smoking in 2015; beer 1/day; in [place]; with wife.    Social Determinants of Health   Financial Resource Strain: Low Risk  (10/21/2019)   Overall Financial Resource Strain (CARDIA)    Difficulty of Paying Living Expenses: Not hard at all  Food Insecurity: No Food Insecurity (10/21/2019)   Hunger Vital Sign    Worried About Running Out of Food in the Last Year: Never true    Ran Out of Food in the Last Year: Never true  Transportation Needs: No Transportation Needs (10/21/2019)   PRAPARE - Hydrologist (Medical): No    Lack of Transportation (Non-Medical): No  Physical Activity: Insufficiently Active (11/25/2019)   Exercise Vital Sign    Days of Exercise per Week: 7 days    Minutes of Exercise per Session: 20 min  Stress: No Stress Concern Present (10/21/2019)   [place]    Feeling of Stress : Not at all  Social Connections: [place] (10/21/2019)   Social Connection and Isolation Panel [NHANES]    Frequency of Communication with Friends and Family: More than three times a week    Frequency of Social Gatherings with Friends and Family: More than three times a week    Attends Religious Services: More than 4 times per year    Active Member of [employer] or Organizations: Yes    Attends Music therapist: More than 4 times per year    Marital Status: Married  Human resources officer Violence: Not At Risk (10/21/2019)   Humiliation, Afraid, Rape, and Kick questionnaire     Fear of Current or Ex-Partner: No    Emotionally Abused: No    Physically Abused: No    Sexually Abused: No    FAMILY HISTORY: Family History  Problem Relation Age of Onset   Heart disease Father    CVA Mother    Lung cancer Sister    Lung cancer Brother     ALLERGIES:  has No Known Allergies.  MEDICATIONS:  Current Outpatient Medications  Medication Sig Dispense Refill   acetaminophen (TYLENOL) 500 MG tablet Take 500-1,000 mg by mouth every 6 (six) hours as needed for mild pain or fever.      albuterol (VENTOLIN HFA) 108 (90 Base) MCG/ACT inhaler Inhale 2 puffs into the lungs every 4 (four) hours as needed for wheezing or shortness of breath. 18 g 0   Calcium Carb-Cholecalciferol (OYSTER SHELL CALCIUM) 500-400 MG-UNIT TABS Take 500 tablets by mouth.     escitalopram (LEXAPRO) 5 MG tablet Take 1 tablet (5 mg total) by mouth  every morning. 90 tablet 0   Fluticasone-Umeclidin-Vilant (TRELEGY ELLIPTA) 100-62.5-25 MCG/INH AEPB Inhale 1 puff into the lungs daily. 180 each 1   levothyroxine (SYNTHROID) 50 MCG tablet TAKE ONE TABLET BY MOUTH DAILY BEFORE BREAKFAST 90 tablet 2   lidocaine-prilocaine (EMLA) cream Apply 1 application topically as needed. Apply small amount to port site at least 1 hour prior to it being accessed, cover with plastic wrap 30 g 1   Multiple Vitamin (MULTIVITAMIN) tablet Take 1 tablet by mouth daily.     QUEtiapine (SEROQUEL) 25 MG tablet Take 1 tablet (25 mg total) by mouth at bedtime. 90 tablet 0   No current facility-administered medications for this visit.   Facility-Administered Medications Ordered in Other Visits  Medication Dose Route Frequency Provider Last Rate Last Admin   0.9 %  sodium chloride infusion   Intravenous Once Charlaine Dalton R, MD       heparin lock flush 100 unit/mL  500 Units Intravenous Once Charlaine Dalton R, MD       heparin lock flush 100 unit/mL  500 Units Intracatheter Once PRN Cammie Sickle, MD        pembrolizumab [facility]) 200 mg in sodium chloride 0.9 % 50 mL chemo infusion  200 mg Intravenous Once Charlaine Dalton R, MD          .  PHYSICAL EXAMINATION: ECOG PERFORMANCE STATUS: 3 - Symptomatic, >50% confined to bed  Vitals:   02/05/22 0917  BP: (!) 152/86  Pulse: 78  Resp: 16  Temp: (!) 96.5 F (35.8 C)  SpO2: 100%   Filed Weights   02/05/22 0917  Weight: 125 lb 6.4 oz (56.9 kg)    Physical Exam HENT:     Head: Normocephalic and atraumatic.     Mouth/Throat:     Pharynx: No oropharyngeal exudate.  Eyes:     Pupils: Pupils are equal, round, and reactive to light.  Cardiovascular:     Rate and Rhythm: Normal rate and regular rhythm.  Pulmonary:     Effort: No respiratory distress.     Breath sounds: No wheezing.     Comments: Decreased air entry bilaterally. Abdominal:     General: Bowel sounds are normal. There is no distension.     Palpations: Abdomen is soft. There is no mass.     Tenderness: There is no abdominal tenderness. There is no guarding or rebound.  Musculoskeletal:        General: No tenderness. Normal range of motion.     Cervical back: Normal range of motion and neck supple.  Skin:    General: Skin is warm.  Neurological:     Mental Status: He is alert and oriented to person, place, and time.  Psychiatric:        Mood and Affect: Affect normal.      LABORATORY DATA:  I have reviewed the data as listed Lab Results  Component Value Date   WBC 5.5 02/05/2022   HGB 13.6 02/05/2022   HCT 43.8 02/05/2022   MCV 99.3 02/05/2022   PLT 156 02/05/2022   Recent Labs    12/25/21 1238 01/15/22 0854 02/05/22 0900  NA 134* 137 139  K 4.3 4.2 3.9  CL 88* 89* 91*  CO2 38* 40* 42*  GLUCOSE 101* 103* 105*  BUN 8 7* 8  CREATININE 0.57* 0.52* 0.50*  CALCIUM 8.9 9.1 9.1  GFRNONAA >60 >60 >60  PROT 7.7 7.6 7.2  ALBUMIN 4.1 4.0 4.0  AST 23 22 20  ALT 13 14 13   ALKPHOS 55 51 53  BILITOT 0.5 0.6 0.6    RADIOGRAPHIC STUDIES: I have  personally reviewed the radiological images as listed and agreed with the findings in the report. No results found.   ASSESSMENT & PLAN:   Cancer of upper lobe of right lung ([place]) # Stage IV metastatic non-small cell lung cancer favor adeno; mets to bone; liver.  Currently on Keytruda maintenance-  Given the oligometastatic progression-s/p RT right upper lobe mass [until dec 15th, 2022]. MAY 18th, 2023- Stable appearance of the treated recurrence posteriorly in the right upper lobe. Stable underlying irregular sclerosis of the right 3rd, 4th and 5th ribs posteriorly and stable postsurgical changes in the upper thoracic spine;  No progressive disease or new metastases identified;  Stable treated hepatic metastatic disease. STABLE.   # proceed with [place] maintainance today. Labs today reviewed;  acceptable for treatment today. Will order CT scan next visit. Discussed with pt- in agreement.   # Bone metastases- on zometa 3 mg IVPB.  Currently q 3 M. Ca-8.9.  Pt awaiting possible dental extractions.  Patient understands that he will need a clearance from oncology prior to dental extractions.   # Hypothyroidism- [ Mid sep 2022]  Normal T3-T4.  Currently on 50 mcg a day; April 2023-3.2-  STABLE.  Awaiting TSH from today.  # COPD-. On 2-3 lit O2; Trelegy.Clinically- STABLE.    ZOMETA- last 5/31-q 16M;   # DISPOSITION:  # ADD TSH today #  Keytruda; # follow up in 3 weeks- labs- cbc/cmp; Keytruda;. - -Dr.B     .    All questions were answered. The patient knows to call the clinic with any problems, questions or concerns.    Cammie Sickle, MD 02/05/2022 9:59 AM

## 2022-02-05 NOTE — Addendum Note (Signed)
Addended by: Vanice Sarah on: 02/05/2022 10:02 AM   Modules accepted: Orders

## 2022-02-05 NOTE — Patient Instructions (Signed)
[facility] CANCER CTR AT [place]  Discharge Instructions: Thank you for choosing [facility] to provide your oncology and hematology care.  If you have a lab appointment with the [facility], please go directly to the [facility] and check in at the registration area.  Wear comfortable clothing and clothing appropriate for easy access to any Portacath or PICC line.   We strive to give you quality time with your provider. You may need to reschedule your appointment if you arrive late (15 or more minutes).  Arriving late affects you and other patients whose appointments are after yours.  Also, if you miss three or more appointments without notifying the office, you may be dismissed from the clinic at the provider's discretion.      For prescription refill requests, have your pharmacy contact our office and allow 72 hours for refills to be completed.    Today you received the following chemotherapy and/or immunotherapy agents Cody [name]    To help prevent nausea and vomiting after your treatment, we encourage you to take your nausea medication as directed.  BELOW ARE SYMPTOMS THAT SHOULD BE REPORTED IMMEDIATELY: *FEVER GREATER THAN 100.4 F (38 C) OR HIGHER *CHILLS OR SWEATING *NAUSEA AND VOMITING THAT IS NOT CONTROLLED WITH YOUR NAUSEA MEDICATION *UNUSUAL SHORTNESS OF BREATH *UNUSUAL BRUISING OR BLEEDING *URINARY PROBLEMS (pain or burning when urinating, or frequent urination) *BOWEL PROBLEMS (unusual diarrhea, constipation, pain near the anus) TENDERNESS IN MOUTH AND THROAT WITH OR WITHOUT PRESENCE OF ULCERS (sore throat, sores in mouth, or a toothache) UNUSUAL RASH, SWELLING OR PAIN  UNUSUAL VAGINAL DISCHARGE OR ITCHING   Items with * indicate a potential emergency and should be followed up as soon as possible or go to the Emergency Department if any problems should occur.  Please show the CHEMOTHERAPY ALERT CARD or IMMUNOTHERAPY ALERT CARD at check-in to the  Emergency Department and triage nurse.  Should you have questions after your visit or need to cancel or reschedule your appointment, please contact [facility] CANCER [address] [place] AT [place]  [phone] and follow the prompts.  Office hours are 8:00 a.m. to 4:30 p.m. Monday - Friday. Please note that voicemails left after 4:00 p.m. may not be returned until the following business day.  We are closed weekends and major holidays. You have access to a nurse at all times for urgent questions. Please call the main number to the clinic [phone] and follow the prompts.  For any non-urgent questions, you may also contact your provider using MyChart. We now offer e-Visits for anyone 58 and older to request care online for non-urgent symptoms. For details visit [email or web address].   Also download the MyChart app! Go to the app store, search "MyChart", open the app, select [facility], and log in with your MyChart username and password.  Masks are optional in the cancer centers. If you would like for your care team to wear a mask while they are taking care of you, please let them know. For doctor visits, patients may have with them one support person who is at least 68 years old. At this time, visitors are not allowed in the infusion area.

## 2022-02-26 ENCOUNTER — Inpatient Hospital Stay: Payer: Medicare HMO

## 2022-02-26 ENCOUNTER — Inpatient Hospital Stay (HOSPITAL_BASED_OUTPATIENT_CLINIC_OR_DEPARTMENT_OTHER): Payer: Medicare HMO | Admitting: Internal Medicine

## 2022-02-26 ENCOUNTER — Encounter: Payer: Self-pay | Admitting: Internal Medicine

## 2022-02-26 ENCOUNTER — Inpatient Hospital Stay: Payer: Medicare HMO | Attending: Internal Medicine

## 2022-02-26 VITALS — BP 150/66 | HR 72 | Temp 95.5°F | Ht 69.0 in | Wt 123.8 lb

## 2022-02-26 DIAGNOSIS — J432 Centrilobular emphysema: Secondary | ICD-10-CM | POA: Diagnosis not present

## 2022-02-26 DIAGNOSIS — R54 Age-related physical debility: Secondary | ICD-10-CM | POA: Insufficient documentation

## 2022-02-26 DIAGNOSIS — K838 Other specified diseases of biliary tract: Secondary | ICD-10-CM | POA: Diagnosis not present

## 2022-02-26 DIAGNOSIS — M549 Dorsalgia, unspecified: Secondary | ICD-10-CM | POA: Insufficient documentation

## 2022-02-26 DIAGNOSIS — M2550 Pain in unspecified joint: Secondary | ICD-10-CM | POA: Diagnosis not present

## 2022-02-26 DIAGNOSIS — Z8249 Family history of ischemic heart disease and other diseases of the circulatory system: Secondary | ICD-10-CM | POA: Diagnosis not present

## 2022-02-26 DIAGNOSIS — Z95828 Presence of other vascular implants and grafts: Secondary | ICD-10-CM

## 2022-02-26 DIAGNOSIS — M47814 Spondylosis without myelopathy or radiculopathy, thoracic region: Secondary | ICD-10-CM | POA: Insufficient documentation

## 2022-02-26 DIAGNOSIS — C787 Secondary malignant neoplasm of liver and intrahepatic bile duct: Secondary | ICD-10-CM | POA: Diagnosis not present

## 2022-02-26 DIAGNOSIS — Z923 Personal history of irradiation: Secondary | ICD-10-CM | POA: Diagnosis not present

## 2022-02-26 DIAGNOSIS — E039 Hypothyroidism, unspecified: Secondary | ICD-10-CM | POA: Insufficient documentation

## 2022-02-26 DIAGNOSIS — C3411 Malignant neoplasm of upper lobe, right bronchus or lung: Secondary | ICD-10-CM | POA: Insufficient documentation

## 2022-02-26 DIAGNOSIS — N400 Enlarged prostate without lower urinary tract symptoms: Secondary | ICD-10-CM | POA: Insufficient documentation

## 2022-02-26 DIAGNOSIS — Z7289 Other problems related to lifestyle: Secondary | ICD-10-CM | POA: Insufficient documentation

## 2022-02-26 DIAGNOSIS — Z823 Family history of stroke: Secondary | ICD-10-CM | POA: Diagnosis not present

## 2022-02-26 DIAGNOSIS — Z79899 Other long term (current) drug therapy: Secondary | ICD-10-CM | POA: Diagnosis not present

## 2022-02-26 DIAGNOSIS — R0602 Shortness of breath: Secondary | ICD-10-CM | POA: Diagnosis not present

## 2022-02-26 DIAGNOSIS — Z5112 Encounter for antineoplastic immunotherapy: Secondary | ICD-10-CM | POA: Diagnosis not present

## 2022-02-26 DIAGNOSIS — Z87891 Personal history of nicotine dependence: Secondary | ICD-10-CM | POA: Insufficient documentation

## 2022-02-26 DIAGNOSIS — Z79890 Hormone replacement therapy (postmenopausal): Secondary | ICD-10-CM | POA: Diagnosis not present

## 2022-02-26 DIAGNOSIS — M47816 Spondylosis without myelopathy or radiculopathy, lumbar region: Secondary | ICD-10-CM | POA: Insufficient documentation

## 2022-02-26 DIAGNOSIS — R5383 Other fatigue: Secondary | ICD-10-CM | POA: Insufficient documentation

## 2022-02-26 DIAGNOSIS — Z7189 Other specified counseling: Secondary | ICD-10-CM

## 2022-02-26 DIAGNOSIS — C7951 Secondary malignant neoplasm of bone: Secondary | ICD-10-CM | POA: Diagnosis not present

## 2022-02-26 DIAGNOSIS — J449 Chronic obstructive pulmonary disease, unspecified: Secondary | ICD-10-CM | POA: Insufficient documentation

## 2022-02-26 DIAGNOSIS — J479 Bronchiectasis, uncomplicated: Secondary | ICD-10-CM | POA: Diagnosis not present

## 2022-02-26 DIAGNOSIS — Z801 Family history of malignant neoplasm of trachea, bronchus and lung: Secondary | ICD-10-CM | POA: Insufficient documentation

## 2022-02-26 LAB — CBC WITH DIFFERENTIAL/PLATELET
Abs Immature Granulocytes: 0.01 10*3/uL (ref 0.00–0.07)
Basophils Absolute: 0 10*3/uL (ref 0.0–0.1)
Basophils Relative: 1 %
Eosinophils Absolute: 0.6 10*3/uL — ABNORMAL HIGH (ref 0.0–0.5)
Eosinophils Relative: 10 %
HCT: 44.1 % (ref 39.0–52.0)
Hemoglobin: 13.9 g/dL (ref 13.0–17.0)
Immature Granulocytes: 0 %
Lymphocytes Relative: 24 %
Lymphs Abs: 1.5 10*3/uL (ref 0.7–4.0)
MCH: 31.2 pg (ref 26.0–34.0)
MCHC: 31.5 g/dL (ref 30.0–36.0)
MCV: 98.9 fL (ref 80.0–100.0)
Monocytes Absolute: 0.8 10*3/uL (ref 0.1–1.0)
Monocytes Relative: 12 %
Neutro Abs: 3.3 10*3/uL (ref 1.7–7.7)
Neutrophils Relative %: 53 %
Platelets: 166 10*3/uL (ref 150–400)
RBC: 4.46 MIL/uL (ref 4.22–5.81)
RDW: 11.9 % (ref 11.5–15.5)
WBC: 6.2 10*3/uL (ref 4.0–10.5)
nRBC: 0 % (ref 0.0–0.2)

## 2022-02-26 LAB — COMPREHENSIVE METABOLIC PANEL
ALT: 15 U/L (ref 0–44)
AST: 21 U/L (ref 15–41)
Albumin: 4.2 g/dL (ref 3.5–5.0)
Alkaline Phosphatase: 52 U/L (ref 38–126)
Anion gap: 7 (ref 5–15)
BUN: 11 mg/dL (ref 8–23)
CO2: 42 mmol/L — ABNORMAL HIGH (ref 22–32)
Calcium: 9 mg/dL (ref 8.9–10.3)
Chloride: 90 mmol/L — ABNORMAL LOW (ref 98–111)
Creatinine, Ser: 0.55 mg/dL — ABNORMAL LOW (ref 0.61–1.24)
GFR, Estimated: >60 mL/min (ref >60)
Glucose, Bld: 101 mg/dL — ABNORMAL HIGH (ref 70–99)
Potassium: 4.2 mmol/L (ref 3.5–5.1)
Sodium: 139 mmol/L (ref 135–145)
Total Bilirubin: 0.5 mg/dL (ref 0.3–1.2)
Total Protein: 7.6 g/dL (ref 6.5–8.1)

## 2022-02-26 MED ORDER — HEPARIN SOD (PORK) LOCK FLUSH 100 UNIT/ML IV SOLN
500.0000 [IU] | Freq: Once | INTRAVENOUS | Status: DC
  Filled 2022-02-26: qty 5

## 2022-02-26 MED ORDER — HEPARIN SOD (PORK) LOCK FLUSH 100 UNIT/ML IV SOLN
500.0000 [IU] | Freq: Once | INTRAVENOUS | Status: AC | PRN
  Administered 2022-02-26: 500 [IU]
  Filled 2022-02-26: qty 5

## 2022-02-26 MED ORDER — SODIUM CHLORIDE 0.9 % IV SOLN
200.0000 mg | Freq: Once | INTRAVENOUS | Status: AC
  Administered 2022-02-26: 200 mg via INTRAVENOUS
  Filled 2022-02-26: qty 8

## 2022-02-26 MED ORDER — SODIUM CHLORIDE 0.9% FLUSH
10.0000 mL | Freq: Once | INTRAVENOUS | Status: AC
  Administered 2022-02-26: 10 mL via INTRAVENOUS
  Filled 2022-02-26: qty 10

## 2022-02-26 MED ORDER — SODIUM CHLORIDE 0.9 % IV SOLN
Freq: Once | INTRAVENOUS | Status: AC
  Filled 2022-02-26: qty 250

## 2022-02-26 NOTE — Progress Notes (Signed)
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   # 2021- MAY 26th-Keytrda x2 cycles; [borderline PS] July 7th-carbo Alimta Keytruda cycle #1  #November 2022-oligometastatic progression-right upper lobe subpleural involvement with; s/p radiation finished December 15.  Temporarily held [place].  #Jan fourth 2023-restarted Keytruda.  # NGS/MOLECULAR TESTS:   # PALLIATIVE CARE EVALUATION:  # PAIN MANAGEMENT:    DIAGNOSIS:   STAGE:         ;  GOALS:  CURRENT/MOST RECENT THERAPY :     Cancer of upper lobe of right lung ([place])  11/23/2019 Initial Diagnosis   Cancer of upper lobe of right lung ([place])   12/21/2019 -  Chemotherapy   Patient is on Treatment Plan : LUNG CARBOplatin / Pemetrexed / Pembrolizumab q21d Induction x 4 cycles / Maintenance Pemetrexed + Pembrolizumab      HISTORY OF PRESENTING ILLNESS: Thin built frail appearing male patient.  No acute distress.  Alone.   Nasal cannula oxygen.  Ambulating independently.  Alone.  Cody [name] 66 y.o.  male patient with metastatic non-small cell lung cancer; cord compression status post decompressive surgery; currently on Keytruda maintenance-is here for follow-up.  Denies any new complaints. No diarrhea.  No nausea no vomiting no  rash.  No worsening shortness of breath.  Review of Systems  Constitutional:  Positive for malaise/fatigue. Negative for chills, diaphoresis and fever.  HENT:  Negative for nosebleeds and sore throat.   Eyes:  Negative for double vision.  Respiratory:  Positive for shortness of breath. Negative for cough, hemoptysis, sputum production and wheezing.   Cardiovascular:  Negative for chest pain, palpitations, orthopnea and leg swelling.  Gastrointestinal:  Negative for abdominal pain, blood in stool, constipation, heartburn, melena, nausea and vomiting.  Genitourinary:  Negative for dysuria, frequency and urgency.  Musculoskeletal:  Positive for back pain and joint pain.  Skin: Negative.  Negative for itching and rash.  Neurological:  Negative for dizziness, tingling, focal weakness, weakness and headaches.  Endo/Heme/Allergies:  Does not bruise/bleed easily.  Psychiatric/Behavioral:  Negative for depression. The patient is not nervous/anxious and does not have insomnia.      MEDICAL HISTORY:  Past Medical History:  Diagnosis Date   Allergy    Cancer of upper lobe of right lung ([place]) 10/2019   COPD (chronic obstructive pulmonary disease) (HCC)    Prostate enlargement     SURGICAL HISTORY: Past Surgical History:  Procedure Laterality Date   HERNIA REPAIR Bilateral 1982   HERNIA REPAIR Right 1994   IR IMAGING GUIDED PORT INSERTION  02/24/2020   LAMINECTOMY FOR EXCISION / EVACUATION INTRASPINAL LESION  11/07/2019   T1-T 6 at [facility]     SOCIAL HISTORY: Social History   Socioeconomic History   Marital status: Married    Spouse name: [name]    Number of children: 4   Years of education: Not on file   Highest  education level: Some college, no degree  Occupational History   Occupation: dispatch and delivery   Tobacco Use   Smoking status: Former    Packs/day: 2.00    Years: 30.00    Total pack years: 60.00    Types: Cigarettes    Quit date: 07/29/2011    Years since quitting: 10.5    Smokeless tobacco: Never   Tobacco comments:    pt vapes  Vaping Use   Vaping Use: Not on file  Substance and Sexual Activity   Alcohol use: Yes    Alcohol/week: 5.0 standard drinks of alcohol    Types: 5 Standard drinks or equivalent per week    Comment: 1-2/day   Drug use: No   Sexual activity: Not Currently    Partners: Female  Other Topics Concern   Not on file  Social History Narrative   Worked in warehouse/ quit smoking in 2015; beer 1/day; in [place]; with wife.    Social Determinants of Health   Financial Resource Strain: Low Risk  (10/21/2019)   Overall Financial Resource Strain (CARDIA)    Difficulty of Paying Living Expenses: Not hard at all  Food Insecurity: No Food Insecurity (10/21/2019)   Hunger Vital Sign    Worried About Running Out of Food in the Last Year: Never true    Ran Out of Food in the Last Year: Never true  Transportation Needs: No Transportation Needs (10/21/2019)   PRAPARE - Hydrologist (Medical): No    Lack of Transportation (Non-Medical): No  Physical Activity: Insufficiently Active (11/25/2019)   Exercise Vital Sign    Days of Exercise per Week: 7 days    Minutes of Exercise per Session: 20 min  Stress: No Stress Concern Present (10/21/2019)   [place]    Feeling of Stress : Not at all  Social Connections: [place] (10/21/2019)   Social Connection and Isolation Panel [NHANES]    Frequency of Communication with Friends and Family: More than three times a week    Frequency of Social Gatherings with Friends and Family: More than three times a week    Attends Religious Services: More than 4 times per year    Active Member of [employer] or Organizations: Yes    Attends Music therapist: More than 4 times per year    Marital Status: Married  Human resources officer Violence: Not At Risk (10/21/2019)   Humiliation, Afraid, Rape, and Kick  questionnaire    Fear of Current or Ex-Partner: No    Emotionally Abused: No    Physically Abused: No    Sexually Abused: No    FAMILY HISTORY: Family History  Problem Relation Age of Onset   Heart disease Father    CVA Mother    Lung cancer Sister    Lung cancer Brother     ALLERGIES:  has No Known Allergies.  MEDICATIONS:  Current Outpatient Medications  Medication Sig Dispense Refill   acetaminophen (TYLENOL) 500 MG tablet Take 500-1,000 mg by mouth every 6 (six) hours as needed for mild pain or fever.      albuterol (VENTOLIN HFA) 108 (90 Base) MCG/ACT inhaler Inhale 2 puffs into the lungs every 4 (four) hours as needed for wheezing or shortness of breath. 18 g 0   Calcium Carb-Cholecalciferol (OYSTER SHELL CALCIUM) 500-400 MG-UNIT TABS Take 500 tablets by mouth.     escitalopram (LEXAPRO) 5 MG tablet Take 1 tablet (5 mg  total) by mouth every morning. 90 tablet 0   Fluticasone-Umeclidin-Vilant (TRELEGY ELLIPTA) 100-62.5-25 MCG/INH AEPB Inhale 1 puff into the lungs daily. 180 each 1   levothyroxine (SYNTHROID) 50 MCG tablet TAKE ONE TABLET BY MOUTH DAILY BEFORE BREAKFAST 90 tablet 2   lidocaine-prilocaine (EMLA) cream Apply 1 application topically as needed. Apply small amount to port site at least 1 hour prior to it being accessed, cover with plastic wrap 30 g 1   Multiple Vitamin (MULTIVITAMIN) tablet Take 1 tablet by mouth daily.     QUEtiapine (SEROQUEL) 25 MG tablet Take 1 tablet (25 mg total) by mouth at bedtime. 90 tablet 0   No current facility-administered medications for this visit.      Marland Kitchen  PHYSICAL EXAMINATION: ECOG PERFORMANCE STATUS: 3 - Symptomatic, >50% confined to bed  Vitals:   02/26/22 0927  BP: (!) 150/66  Pulse: 72  Temp: (!) 95.5 F (35.3 C)  SpO2: 97%   Filed Weights   02/26/22 0927  Weight: 123 lb 12.8 oz (56.2 kg)    Physical Exam HENT:     Head: Normocephalic and atraumatic.     Mouth/Throat:     Pharynx: No oropharyngeal exudate.   Eyes:     Pupils: Pupils are equal, round, and reactive to light.  Cardiovascular:     Rate and Rhythm: Normal rate and regular rhythm.  Pulmonary:     Effort: No respiratory distress.     Breath sounds: No wheezing.     Comments: Decreased air entry bilaterally. Abdominal:     General: Bowel sounds are normal. There is no distension.     Palpations: Abdomen is soft. There is no mass.     Tenderness: There is no abdominal tenderness. There is no guarding or rebound.  Musculoskeletal:        General: No tenderness. Normal range of motion.     Cervical back: Normal range of motion and neck supple.  Skin:    General: Skin is warm.  Neurological:     Mental Status: He is alert and oriented to person, place, and time.  Psychiatric:        Mood and Affect: Affect normal.      LABORATORY DATA:  I have reviewed the data as listed Lab Results  Component Value Date   WBC 6.2 02/26/2022   HGB 13.9 02/26/2022   HCT 44.1 02/26/2022   MCV 98.9 02/26/2022   PLT 166 02/26/2022   Recent Labs    01/15/22 0854 02/05/22 0900 02/26/22 0905  NA 137 139 139  K 4.2 3.9 4.2  CL 89* 91* 90*  CO2 40* 42* 42*  GLUCOSE 103* 105* 101*  BUN 7* 8 11  CREATININE 0.52* 0.50* 0.55*  CALCIUM 9.1 9.1 9.0  GFRNONAA >60 >60 >60  PROT 7.6 7.2 7.6  ALBUMIN 4.0 4.0 4.2  AST 22 20 21   ALT 14 13 15   ALKPHOS 51 53 52  BILITOT 0.6 0.6 0.5    RADIOGRAPHIC STUDIES: I have personally reviewed the radiological images as listed and agreed with the findings in the report. No results found.   ASSESSMENT & PLAN:   Cancer of upper lobe of right lung ([place]) # Stage IV metastatic non-small cell lung cancer favor adeno; mets to bone; liver.  Currently on Keytruda maintenance-  Given the oligometastatic progression-s/p RT right upper lobe mass [until dec 15th, 2022]. MAY 18th, 2023- Stable appearance of the treated recurrence posteriorly in the right upper lobe. Stable underlying irregular sclerosis  of the  right 3rd, 4th and 5th ribs posteriorly and stable postsurgical changes in the upper thoracic spine;  No progressive disease or new metastases identified;  Stable treated hepatic metastatic disease. STABLE.   # proceed with [place] maintainance today. Labs today reviewed;  acceptable for treatment today. Will order CT scan today.  # Bone metastases- on zometa 3 mg IVPB.  Currently q 3 M. Ca-8.9.  Pt awaiting possible dental extractions.  Patient understands that he will need a clearance from oncology prior to dental extractions.   # Hypothyroidism- [ Mid sep 2022]  Normal T3-T4.  Currently on 50 mcg a day; JULY  2023-3.2-  STABLE.   # COPD-. On 2-3 lit O2; Trelegy.Clinically- STABLE.    ZOMETA- last 5/31-q 88M;   # DISPOSITION:  #  Keytruda; # follow up in 3 weeks- labs- cbc/cmp; Keytruda; CT CAP. - -Dr.B   .    All questions were answered. The patient knows to call the clinic with any problems, questions or concerns.    Cammie Sickle, MD 02/26/2022 10:00 AM

## 2022-02-26 NOTE — Assessment & Plan Note (Addendum)
#   Stage IV metastatic non-small cell lung cancer favor adeno; mets to bone; liver.  Currently on Keytruda maintenance-  Given the oligometastatic progression-s/p RT right upper lobe mass [until dec 15th, 2022]. MAY 18th, 2023- Stable appearance of the treated recurrence posteriorly in the right upper lobe. Stable underlying irregular sclerosis of the right 3rd, 4th and 5th ribs posteriorly and stable postsurgical changes in the upper thoracic spine;  No progressive disease or new metastases identified;  Stable treated hepatic metastatic disease. STABLE.   # proceed with [place] maintainance today. Labs today reviewed;  acceptable for treatment today. Will order CT scan today.  # Bone metastases- on zometa 3 mg IVPB.  Currently q 3 M. Ca-8.9.  Pt awaiting possible dental extractions.  Patient understands that he will need a clearance from oncology prior to dental extractions.   # Hypothyroidism- [ Mid sep 2022]  Normal T3-T4.  Currently on 50 mcg a day; JULY  2023-3.2-  STABLE.   # COPD-. On 2-3 lit O2; Trelegy.Clinically- STABLE.    ZOMETA- last 5/31-q 87M;   # DISPOSITION:  #  Keytruda; # follow up in 3 weeks- labs- cbc/cmp; Keytruda; CT CAP. - -Dr.B   .

## 2022-02-26 NOTE — Patient Instructions (Signed)
[facility]  Discharge Instructions: Thank you for choosing [facility] to provide your oncology and hematology care.  If you have a lab appointment with the Cancer Center, please go directly to the Cancer Center and check in at the registration area.  Wear comfortable clothing and clothing appropriate for easy access to any Portacath or PICC line.   We strive to give you quality time with your provider. You may need to reschedule your appointment if you arrive late (15 or more minutes).  Arriving late affects you and other patients whose appointments are after yours.  Also, if you miss three or more appointments without notifying the office, you may be dismissed from the clinic at the provider's discretion.      For prescription refill requests, have your pharmacy contact our office and allow 72 hours for refills to be completed.       To help prevent nausea and vomiting after your treatment, we encourage you to take your nausea medication as directed.  BELOW ARE SYMPTOMS THAT SHOULD BE REPORTED IMMEDIATELY: *FEVER GREATER THAN 100.4 F (38 C) OR HIGHER *CHILLS OR SWEATING *NAUSEA AND VOMITING THAT IS NOT CONTROLLED WITH YOUR NAUSEA MEDICATION *UNUSUAL SHORTNESS OF BREATH *UNUSUAL BRUISING OR BLEEDING *URINARY PROBLEMS (pain or burning when urinating, or frequent urination) *BOWEL PROBLEMS (unusual diarrhea, constipation, pain near the anus) TENDERNESS IN MOUTH AND THROAT WITH OR WITHOUT PRESENCE OF ULCERS (sore throat, sores in mouth, or a toothache) UNUSUAL RASH, SWELLING OR PAIN  UNUSUAL VAGINAL DISCHARGE OR ITCHING   Items with * indicate a potential emergency and should be followed up as soon as possible or go to the Emergency Department if any problems should occur.  Please show the CHEMOTHERAPY ALERT CARD or IMMUNOTHERAPY ALERT CARD at check-in to the Emergency Department and triage nurse.  Should you have questions after your  visit or need to cancel or reschedule your appointment, please contact [facility]  [phone] and follow the prompts.  Office hours are 8:00 a.m. to 4:30 p.m. Monday - Friday. Please note that voicemails left after 4:00 p.m. may not be returned until the following business day.  We are closed weekends and major holidays. You have access to a nurse at all times for urgent questions. Please call the main number to the clinic [phone] and follow the prompts.  For any non-urgent questions, you may also contact your provider using MyChart. We now offer e-Visits for anyone 18 and older to request care online for non-urgent symptoms. For details visit [email or web address].   Also download the MyChart app! Go to the app store, search "MyChart", open the app, select [facility], and log in with your MyChart username and password.  Masks are optional in the cancer centers. If you would like for your care team to wear a mask while they are taking care of you, please let them know. For doctor visits, patients may have with them one support person who is at least 68 years old. At this time, visitors are not allowed in the infusion area.   

## 2022-03-01 DIAGNOSIS — J9601 Acute respiratory failure with hypoxia: Secondary | ICD-10-CM | POA: Diagnosis not present

## 2022-03-01 DIAGNOSIS — J441 Chronic obstructive pulmonary disease with (acute) exacerbation: Secondary | ICD-10-CM | POA: Diagnosis not present

## 2022-03-17 ENCOUNTER — Ambulatory Visit
Admission: RE | Admit: 2022-03-17 | Discharge: 2022-03-17 | Disposition: A | Discharge status: Home / Self Care | Payer: Medicare HMO | Source: Ambulatory Visit | Attending: Internal Medicine | Admitting: Internal Medicine

## 2022-03-17 DIAGNOSIS — N400 Enlarged prostate without lower urinary tract symptoms: Secondary | ICD-10-CM | POA: Diagnosis not present

## 2022-03-17 DIAGNOSIS — K7689 Other specified diseases of liver: Secondary | ICD-10-CM | POA: Diagnosis not present

## 2022-03-17 DIAGNOSIS — N3289 Other specified disorders of bladder: Secondary | ICD-10-CM | POA: Diagnosis not present

## 2022-03-17 DIAGNOSIS — J432 Centrilobular emphysema: Secondary | ICD-10-CM | POA: Diagnosis not present

## 2022-03-17 DIAGNOSIS — I700 Atherosclerosis of aorta: Secondary | ICD-10-CM | POA: Diagnosis not present

## 2022-03-17 DIAGNOSIS — J479 Bronchiectasis, uncomplicated: Secondary | ICD-10-CM | POA: Diagnosis not present

## 2022-03-17 DIAGNOSIS — C3411 Malignant neoplasm of upper lobe, right bronchus or lung: Secondary | ICD-10-CM | POA: Insufficient documentation

## 2022-03-17 DIAGNOSIS — K828 Other specified diseases of gallbladder: Secondary | ICD-10-CM | POA: Diagnosis not present

## 2022-03-17 MED ORDER — IOHEXOL 300 MG/ML  SOLN
80.0000 mL | Freq: Once | INTRAMUSCULAR | Status: AC | PRN
  Administered 2022-03-17: 80 mL via INTRAVENOUS

## 2022-03-19 ENCOUNTER — Other Ambulatory Visit: Payer: Self-pay | Admitting: Internal Medicine

## 2022-03-19 ENCOUNTER — Encounter: Payer: Self-pay | Admitting: Internal Medicine

## 2022-03-19 ENCOUNTER — Inpatient Hospital Stay: Payer: Medicare HMO

## 2022-03-19 ENCOUNTER — Inpatient Hospital Stay: Payer: Medicare HMO | Admitting: Internal Medicine

## 2022-03-19 VITALS — BP 148/84 | HR 85 | Temp 96.6°F | Ht 69.0 in | Wt 123.6 lb

## 2022-03-19 DIAGNOSIS — N400 Enlarged prostate without lower urinary tract symptoms: Secondary | ICD-10-CM | POA: Diagnosis not present

## 2022-03-19 DIAGNOSIS — M47816 Spondylosis without myelopathy or radiculopathy, lumbar region: Secondary | ICD-10-CM | POA: Diagnosis not present

## 2022-03-19 DIAGNOSIS — C7951 Secondary malignant neoplasm of bone: Secondary | ICD-10-CM | POA: Diagnosis not present

## 2022-03-19 DIAGNOSIS — Z7189 Other specified counseling: Secondary | ICD-10-CM

## 2022-03-19 DIAGNOSIS — J449 Chronic obstructive pulmonary disease, unspecified: Secondary | ICD-10-CM | POA: Diagnosis not present

## 2022-03-19 DIAGNOSIS — R5383 Other fatigue: Secondary | ICD-10-CM | POA: Diagnosis not present

## 2022-03-19 DIAGNOSIS — E039 Hypothyroidism, unspecified: Secondary | ICD-10-CM | POA: Diagnosis not present

## 2022-03-19 DIAGNOSIS — Z79899 Other long term (current) drug therapy: Secondary | ICD-10-CM | POA: Diagnosis not present

## 2022-03-19 DIAGNOSIS — Z5112 Encounter for antineoplastic immunotherapy: Secondary | ICD-10-CM | POA: Diagnosis not present

## 2022-03-19 DIAGNOSIS — Z7289 Other problems related to lifestyle: Secondary | ICD-10-CM | POA: Diagnosis not present

## 2022-03-19 DIAGNOSIS — K838 Other specified diseases of biliary tract: Secondary | ICD-10-CM | POA: Diagnosis not present

## 2022-03-19 DIAGNOSIS — Z79890 Hormone replacement therapy (postmenopausal): Secondary | ICD-10-CM | POA: Diagnosis not present

## 2022-03-19 DIAGNOSIS — M2550 Pain in unspecified joint: Secondary | ICD-10-CM | POA: Diagnosis not present

## 2022-03-19 DIAGNOSIS — C3411 Malignant neoplasm of upper lobe, right bronchus or lung: Secondary | ICD-10-CM

## 2022-03-19 DIAGNOSIS — Z8249 Family history of ischemic heart disease and other diseases of the circulatory system: Secondary | ICD-10-CM | POA: Diagnosis not present

## 2022-03-19 DIAGNOSIS — R54 Age-related physical debility: Secondary | ICD-10-CM | POA: Diagnosis not present

## 2022-03-19 DIAGNOSIS — C787 Secondary malignant neoplasm of liver and intrahepatic bile duct: Secondary | ICD-10-CM | POA: Diagnosis not present

## 2022-03-19 DIAGNOSIS — R0602 Shortness of breath: Secondary | ICD-10-CM | POA: Diagnosis not present

## 2022-03-19 DIAGNOSIS — J479 Bronchiectasis, uncomplicated: Secondary | ICD-10-CM | POA: Diagnosis not present

## 2022-03-19 DIAGNOSIS — M47814 Spondylosis without myelopathy or radiculopathy, thoracic region: Secondary | ICD-10-CM | POA: Diagnosis not present

## 2022-03-19 DIAGNOSIS — J432 Centrilobular emphysema: Secondary | ICD-10-CM | POA: Diagnosis not present

## 2022-03-19 DIAGNOSIS — G4700 Insomnia, unspecified: Secondary | ICD-10-CM | POA: Diagnosis not present

## 2022-03-19 DIAGNOSIS — M549 Dorsalgia, unspecified: Secondary | ICD-10-CM | POA: Diagnosis not present

## 2022-03-19 DIAGNOSIS — Z823 Family history of stroke: Secondary | ICD-10-CM | POA: Diagnosis not present

## 2022-03-19 DIAGNOSIS — Z923 Personal history of irradiation: Secondary | ICD-10-CM | POA: Diagnosis not present

## 2022-03-19 DIAGNOSIS — Z87891 Personal history of nicotine dependence: Secondary | ICD-10-CM | POA: Diagnosis not present

## 2022-03-19 LAB — CBC WITH DIFFERENTIAL/PLATELET
Abs Immature Granulocytes: 0.02 10*3/uL (ref 0.00–0.07)
Basophils Absolute: 0.1 10*3/uL (ref 0.0–0.1)
Basophils Relative: 1 %
Eosinophils Absolute: 0.6 10*3/uL — ABNORMAL HIGH (ref 0.0–0.5)
Eosinophils Relative: 10 %
HCT: 44.3 % (ref 39.0–52.0)
Hemoglobin: 14 g/dL (ref 13.0–17.0)
Immature Granulocytes: 0 %
Lymphocytes Relative: 16 %
Lymphs Abs: 1 10*3/uL (ref 0.7–4.0)
MCH: 30.9 pg (ref 26.0–34.0)
MCHC: 31.6 g/dL (ref 30.0–36.0)
MCV: 97.8 fL (ref 80.0–100.0)
Monocytes Absolute: 0.6 10*3/uL (ref 0.1–1.0)
Monocytes Relative: 10 %
Neutro Abs: 3.9 10*3/uL (ref 1.7–7.7)
Neutrophils Relative %: 63 %
Platelets: 164 10*3/uL (ref 150–400)
RBC: 4.53 MIL/uL (ref 4.22–5.81)
RDW: 12 % (ref 11.5–15.5)
WBC: 6.3 10*3/uL (ref 4.0–10.5)
nRBC: 0 % (ref 0.0–0.2)

## 2022-03-19 LAB — COMPREHENSIVE METABOLIC PANEL
ALT: 14 U/L (ref 0–44)
AST: 21 U/L (ref 15–41)
Albumin: 4.1 g/dL (ref 3.5–5.0)
Alkaline Phosphatase: 53 U/L (ref 38–126)
Anion gap: 10 (ref 5–15)
BUN: 8 mg/dL (ref 8–23)
CO2: 38 mmol/L — ABNORMAL HIGH (ref 22–32)
Calcium: 8.9 mg/dL (ref 8.9–10.3)
Chloride: 89 mmol/L — ABNORMAL LOW (ref 98–111)
Creatinine, Ser: 0.48 mg/dL — ABNORMAL LOW (ref 0.61–1.24)
GFR, Estimated: >60 mL/min (ref >60)
Glucose, Bld: 96 mg/dL (ref 70–99)
Potassium: 4.2 mmol/L (ref 3.5–5.1)
Sodium: 137 mmol/L (ref 135–145)
Total Bilirubin: 0.5 mg/dL (ref 0.3–1.2)
Total Protein: 7.6 g/dL (ref 6.5–8.1)

## 2022-03-19 MED ORDER — SODIUM CHLORIDE 0.9 % IV SOLN
200.0000 mg | Freq: Once | INTRAVENOUS | Status: AC
  Administered 2022-03-19: 200 mg via INTRAVENOUS
  Filled 2022-03-19: qty 8

## 2022-03-19 MED ORDER — QUETIAPINE FUMARATE 25 MG PO TABS: 25.0000 mg | ORAL_TABLET | Freq: Every day | ORAL | 1 refills | Status: DC

## 2022-03-19 MED ORDER — HEPARIN SOD (PORK) LOCK FLUSH 100 UNIT/ML IV SOLN
500.0000 [IU] | Freq: Once | INTRAVENOUS | Status: AC | PRN
  Administered 2022-03-19: 500 [IU]
  Filled 2022-03-19: qty 5

## 2022-03-19 MED ORDER — PROCHLORPERAZINE MALEATE 10 MG PO TABS: 10.0000 mg | ORAL_TABLET | Freq: Once | ORAL | Status: DC

## 2022-03-19 MED ORDER — SODIUM CHLORIDE 0.9 % IV SOLN
Freq: Once | INTRAVENOUS | Status: AC
  Filled 2022-03-19: qty 250

## 2022-03-19 NOTE — Patient Instructions (Signed)
[facility]  Discharge Instructions: Thank you for choosing [facility] to provide your oncology and hematology care.  If you have a lab appointment with the Cancer Center, please go directly to the Cancer Center and check in at the registration area.  Wear comfortable clothing and clothing appropriate for easy access to any Portacath or PICC line.   We strive to give you quality time with your provider. You may need to reschedule your appointment if you arrive late (15 or more minutes).  Arriving late affects you and other patients whose appointments are after yours.  Also, if you miss three or more appointments without notifying the office, you may be dismissed from the clinic at the provider's discretion.      For prescription refill requests, have your pharmacy contact our office and allow 72 hours for refills to be completed.    Today you received the following chemotherapy and/or immunotherapy agents Keytruda      To help prevent nausea and vomiting after your treatment, we encourage you to take your nausea medication as directed.  BELOW ARE SYMPTOMS THAT SHOULD BE REPORTED IMMEDIATELY: *FEVER GREATER THAN 100.4 F (38 C) OR HIGHER *CHILLS OR SWEATING *NAUSEA AND VOMITING THAT IS NOT CONTROLLED WITH YOUR NAUSEA MEDICATION *UNUSUAL SHORTNESS OF BREATH *UNUSUAL BRUISING OR BLEEDING *URINARY PROBLEMS (pain or burning when urinating, or frequent urination) *BOWEL PROBLEMS (unusual diarrhea, constipation, pain near the anus) TENDERNESS IN MOUTH AND THROAT WITH OR WITHOUT PRESENCE OF ULCERS (sore throat, sores in mouth, or a toothache) UNUSUAL RASH, SWELLING OR PAIN  UNUSUAL VAGINAL DISCHARGE OR ITCHING   Items with * indicate a potential emergency and should be followed up as soon as possible or go to the Emergency Department if any problems should occur.  Please show the CHEMOTHERAPY ALERT CARD or IMMUNOTHERAPY ALERT CARD at check-in to  the Emergency Department and triage nurse.  Should you have questions after your visit or need to cancel or reschedule your appointment, please contact [facility]  [phone] and follow the prompts.  Office hours are 8:00 a.m. to 4:30 p.m. Monday - Friday. Please note that voicemails left after 4:00 p.m. may not be returned until the following business day.  We are closed weekends and major holidays. You have access to a nurse at all times for urgent questions. Please call the main number to the clinic [phone] and follow the prompts.  For any non-urgent questions, you may also contact your provider using MyChart. We now offer e-Visits for anyone 18 and older to request care online for non-urgent symptoms. For details visit [email or web address].   Also download the MyChart app! Go to the app store, search "MyChart", open the app, select [facility], and log in with your MyChart username and password.  Masks are optional in the cancer centers. If you would like for your care team to wear a mask while they are taking care of you, please let them know. For doctor visits, patients may have with them one support person who is at least 68 years old. At this time, visitors are not allowed in the infusion area.   

## 2022-03-19 NOTE — Assessment & Plan Note (Addendum)
#   Stage IV metastatic non-small cell lung cancer favor adeno; mets to bone; liver.    oligometastatic progression-s/p RT right upper lobe mass [until dec 15th, 2022]. AUG 21st, 2023- Stable post treatment changes in the apical right upper lobe and posterior upper right ribs with no evidence of local tumor recurrence;  Stable sclerotic osseous metastases in the T3 and T4 vertebral bodies status post posterior spinal fusion.  Stable treated tiny liver lesions; No new or progressive metastatic disease in the chest, abdomen or pelvis.  Currently on Keytruda maintenance.  # proceed with [place] maintainance today. Labs today reviewed;  acceptable for treatment today.   # Bone metastases- on zometa 3 mg IVPB.  Currently q 3 M. Ca-8.9.  Pt awaiting possible dental extractions.  Patient understands that he will need a clearance from oncology prior to dental extractions.   # Hypothyroidism- [ Mid sep 2022]  Normal T3-T4.  Currently on 50 mcg a day; JULY  2023-3.2- STABLE.   # COPD-. On 2-3 lit O2; Trelegy.Clinically- STABLE. Marland Kitchen  #Incidental findings on Imaging  CT , August 2023: Atherosclerosis; prostatomegaly emphysema I reviewed/discussed/counseled the patient.     ZOMETA- last 5/31-q 60M;   # DISPOSITION:  #  Keytruda; # follow up in 3 weeks- labs- cbc/cmp; Keytruda;Zometa-TSH  - -Dr.B  # I reviewed the blood work- with the patient in detail; also reviewed the imaging independently [as summarized above]; and with the patient in detail.     Marland Kitchen

## 2022-03-19 NOTE — Progress Notes (Signed)
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   # 2021- MAY 26th-Keytrda x2 cycles; [borderline PS] July 7th-carbo Alimta Keytruda cycle #1  #November 2022-oligometastatic progression-right upper lobe subpleural involvement with; s/p radiation finished December 15.  Temporarily held [place].  #Jan fourth 2023-restarted Keytruda.  # NGS/MOLECULAR TESTS:   # PALLIATIVE CARE EVALUATION:  # PAIN MANAGEMENT:    DIAGNOSIS:   STAGE:         ;  GOALS:  CURRENT/MOST RECENT THERAPY :     Cancer of upper lobe of right lung ([place])  11/23/2019 Initial Diagnosis   Cancer of upper lobe of right lung ([place])   12/16/2019 -  Chemotherapy   Patient is on Treatment Plan : LUNG Carboplatin (5) + Pemetrexed (500) + Pembrolizumab (200) D1 q21d Induction x 4 cycles / Maintenance Pemetrexed (500) + Pembrolizumab (200) D1 q21d     12/21/2019 - 02/26/2022 Chemotherapy   Patient is on Treatment Plan : LUNG CARBOplatin / Pemetrexed / Pembrolizumab q21d Induction x 4 cycles / Maintenance Pemetrexed + Pembrolizumab      HISTORY OF PRESENTING ILLNESS: Thin built frail appearing male patient.  No acute distress.  Alone.   Nasal cannula oxygen.  Ambulating independently.    Cody [name] 66 y.o.  male patient  with metastatic non-small cell lung cancer; cord compression status post decompressive surgery; currently on Keytruda maintenance-is here for follow-up/review results of the CT scan.  Denies any new complaints. No diarrhea.  No nausea no vomiting no rash.  No worsening shortness of breath.  Review of Systems  Constitutional:  Positive for malaise/fatigue. Negative for chills, diaphoresis and fever.  HENT:  Negative for nosebleeds and sore throat.   Eyes:  Negative for double vision.  Respiratory:  Positive for shortness of breath. Negative for cough, hemoptysis, sputum production and wheezing.   Cardiovascular:  Negative for chest pain, palpitations, orthopnea and leg swelling.  Gastrointestinal:  Negative for abdominal pain, blood in stool, constipation, heartburn, melena, nausea and vomiting.  Genitourinary:  Negative for dysuria, frequency and urgency.  Musculoskeletal:  Positive for back pain and joint pain.  Skin: Negative.  Negative for itching and rash.  Neurological:  Negative for dizziness, tingling, focal weakness, weakness and headaches.  Endo/Heme/Allergies:  Does not bruise/bleed easily.  Psychiatric/Behavioral:  Negative for depression. The patient is not nervous/anxious and does not have insomnia.      MEDICAL HISTORY:  Past Medical History:  Diagnosis Date   Allergy    Cancer of upper lobe of right lung ([place]) 10/2019   COPD (chronic obstructive pulmonary disease) (HCC)    Prostate enlargement     SURGICAL HISTORY: Past Surgical History:  Procedure Laterality Date   HERNIA REPAIR Bilateral 1982   HERNIA REPAIR Right 1994   IR IMAGING GUIDED PORT INSERTION  02/24/2020   LAMINECTOMY FOR EXCISION / EVACUATION INTRASPINAL LESION  11/07/2019  T1-T 6 at [facility]     SOCIAL HISTORY: Social History   Socioeconomic History   Marital status: Married    Spouse name: [name]    Number of children: 4   Years of education: Not on file   Highest education level: Some college,  no degree  Occupational History   Occupation: dispatch and delivery   Tobacco Use   Smoking status: Former    Packs/day: 2.00    Years: 30.00    Total pack years: 60.00    Types: Cigarettes    Quit date: 07/29/2011    Years since quitting: 10.6   Smokeless tobacco: Never   Tobacco comments:    pt vapes  Vaping Use   Vaping Use: Not on file  Substance and Sexual Activity   Alcohol use: Yes    Alcohol/week: 5.0 standard drinks of alcohol    Types: 5 Standard drinks or equivalent per week    Comment: 1-2/day   Drug use: No   Sexual activity: Not Currently    Partners: Female  Other Topics Concern   Not on file  Social History Narrative   Worked in warehouse/ quit smoking in 2015; beer 1/day; in [place]; with wife.    Social Determinants of Health   Financial Resource Strain: Low Risk  (10/21/2019)   Overall Financial Resource Strain (CARDIA)    Difficulty of Paying Living Expenses: Not hard at all  Food Insecurity: No Food Insecurity (10/21/2019)   Hunger Vital Sign    Worried About Running Out of Food in the Last Year: Never true    Ran Out of Food in the Last Year: Never true  Transportation Needs: No Transportation Needs (10/21/2019)   PRAPARE - Hydrologist (Medical): No    Lack of Transportation (Non-Medical): No  Physical Activity: Insufficiently Active (11/25/2019)   Exercise Vital Sign    Days of Exercise per Week: 7 days    Minutes of Exercise per Session: 20 min  Stress: No Stress Concern Present (10/21/2019)   [place]    Feeling of Stress : Not at all  Social Connections: [place] (10/21/2019)   Social Connection and Isolation Panel [NHANES]    Frequency of Communication with Friends and Family: More than three times a week    Frequency of Social Gatherings with Friends and Family: More than three times a week    Attends Religious Services: More than 4  times per year    Active Member of [employer] or Organizations: Yes    Attends Music therapist: More than 4 times per year    Marital Status: Married  Human resources officer Violence: Not At Risk (10/21/2019)   Humiliation, Afraid, Rape, and Kick questionnaire    Fear of Current or Ex-Partner: No    Emotionally Abused: No    Physically Abused: No    Sexually Abused: No    FAMILY HISTORY: Family History  Problem Relation Age of Onset   Heart disease Father    CVA Mother    Lung cancer Sister    Lung cancer Brother     ALLERGIES:  has No Known Allergies.  MEDICATIONS:  Current Outpatient Medications  Medication Sig Dispense Refill   acetaminophen (TYLENOL) 500 MG tablet Take 500-1,000 mg by mouth every 6 (six) hours as needed for mild pain or fever.      albuterol (VENTOLIN HFA) 108 (90 Base) MCG/ACT inhaler Inhale 2 puffs into the  lungs every 4 (four) hours as needed for wheezing or shortness of breath. 18 g 0   Calcium Carb-Cholecalciferol (OYSTER SHELL CALCIUM) 500-400 MG-UNIT TABS Take 500 tablets by mouth.     escitalopram (LEXAPRO) 5 MG tablet Take 1 tablet (5 mg total) by mouth every morning. 90 tablet 0   Fluticasone-Umeclidin-Vilant (TRELEGY ELLIPTA) 100-62.5-25 MCG/INH AEPB Inhale 1 puff into the lungs daily. 180 each 1   levothyroxine (SYNTHROID) 50 MCG tablet TAKE ONE TABLET BY MOUTH DAILY BEFORE BREAKFAST 90 tablet 2   lidocaine-prilocaine (EMLA) cream Apply 1 application topically as needed. Apply small amount to port site at least 1 hour prior to it being accessed, cover with plastic wrap 30 g 1   Multiple Vitamin (MULTIVITAMIN) tablet Take 1 tablet by mouth daily.     QUEtiapine (SEROQUEL) 25 MG tablet Take 1 tablet (25 mg total) by mouth at bedtime. 90 tablet 1   No current facility-administered medications for this visit.      Marland Kitchen  PHYSICAL EXAMINATION: ECOG PERFORMANCE STATUS: 3 - Symptomatic, >50% confined to bed  Vitals:   03/19/22 0910  BP: (!)  148/84  Pulse: 85  Temp: (!) 96.6 F (35.9 C)  SpO2: 100%   Filed Weights   03/19/22 0910  Weight: 123 lb 9.6 oz (56.1 kg)    Physical Exam HENT:     Head: Normocephalic and atraumatic.     Mouth/Throat:     Pharynx: No oropharyngeal exudate.  Eyes:     Pupils: Pupils are equal, round, and reactive to light.  Cardiovascular:     Rate and Rhythm: Normal rate and regular rhythm.  Pulmonary:     Effort: No respiratory distress.     Breath sounds: No wheezing.     Comments: Decreased air entry bilaterally. Abdominal:     General: Bowel sounds are normal. There is no distension.     Palpations: Abdomen is soft. There is no mass.     Tenderness: There is no abdominal tenderness. There is no guarding or rebound.  Musculoskeletal:        General: No tenderness. Normal range of motion.     Cervical back: Normal range of motion and neck supple.  Skin:    General: Skin is warm.  Neurological:     Mental Status: He is alert and oriented to person, place, and time.  Psychiatric:        Mood and Affect: Affect normal.      LABORATORY DATA:  I have reviewed the data as listed Lab Results  Component Value Date   WBC 6.3 03/19/2022   HGB 14.0 03/19/2022   HCT 44.3 03/19/2022   MCV 97.8 03/19/2022   PLT 164 03/19/2022   Recent Labs    02/05/22 0900 02/26/22 0905 03/19/22 0907  NA 139 139 137  K 3.9 4.2 4.2  CL 91* 90* 89*  CO2 42* 42* 38*  GLUCOSE 105* 101* 96  BUN _0 CREATININE 0.50* 0.55* 0.48*  CALCIUM 9.1 9.0 8.9  GFRNONAA >60 >60 >60  PROT 7.2 7.6 7.6  ALBUMIN 4.0 4.2 4.1  AST _1 ALT _2 ALKPHOS 53 52 53  BILITOT 0.6 0.5 0.5    RADIOGRAPHIC STUDIES: I have personally reviewed the radiological images as listed and agreed with the findings in the report. CT CHEST ABDOMEN PELVIS W CONTRAST  Result Date: 03/18/2022 CLINICAL DATA:  Stage IV non-small cell right upper lobe lung cancer, with ongoing Keytruda  therapy. Restaging. * Tracking  Code: BO * EXAM: CT CHEST, ABDOMEN, AND PELVIS WITH CONTRAST TECHNIQUE: Multidetector CT imaging of the chest, abdomen and pelvis was performed following the standard protocol during bolus administration of intravenous contrast. RADIATION DOSE REDUCTION: This exam was performed according to the departmental dose-optimization program which includes automated exposure control, adjustment of the mA and/or kV according to patient size and/or use of iterative reconstruction technique. CONTRAST:  72m OMNIPAQUE IOHEXOL 300 MG/ML  SOLN COMPARISON:  12/11/2021 CT chest, abdomen and pelvis. FINDINGS: CT CHEST FINDINGS Cardiovascular: Normal heart size. No significant pericardial effusion/thickening. Right internal jugular Port-A-Cath terminates at the cavoatrial junction. Three-vessel coronary atherosclerosis. Atherosclerotic nonaneurysmal thoracic aorta. Normal caliber pulmonary arteries. No central pulmonary emboli. Mediastinum/Nodes: No discrete thyroid nodules. Unremarkable esophagus. No pathologically enlarged axillary, mediastinal or hilar lymph nodes. Lungs/Pleura: No pneumothorax. No pleural effusion. Severe centrilobular emphysema. Irregular bandlike consolidation in the apical right upper lobe with associated volume loss, bronchiectasis and distortion, unchanged, compatible with post treatment change. Patchy subpleural reticulation and subpleural lines in the dependent basilar right lower lobe, mildly increased, compatible with a combination of nonspecific fibrosis and atelectasis. No acute consolidative airspace disease or new significant pulmonary nodules. Musculoskeletal: Stable postsurgical changes from bilateral posterior spinal fusion T1-T6. Stable sclerotic T3 and T4 vertebral lesions with associated chronic pathologic fractures. Stable sclerotic change throughout the posterior right third, fourth and fifth ribs. No new focal osseous lesions in the chest. Mild thoracic spondylosis. CT ABDOMEN PELVIS FINDINGS  Hepatobiliary: Normal liver size. Chronic mild intrahepatic biliary ductal dilatation near the gallbladder. Otherwise no intrahepatic or extrahepatic biliary ductal dilatation. Scattered subcentimeter hypodense liver lesions (series 2/images 62 and 67) are stable. No new liver lesions. Normal gallbladder with no radiopaque cholelithiasis. No biliary ductal dilatation. Pancreas: Normal, with no mass or duct dilation. Spleen: Normal size. No mass. Adrenals/Urinary Tract: Normal kidneys with no hydronephrosis and no renal mass. Nondistended bladder with chronic mild diffuse bladder wall thickening. Stomach/Bowel: Normal non-distended stomach. Normal caliber small bowel with no small bowel wall thickening. Normal appendix. Oral contrast transits to the colon. Normal large bowel with no diverticulosis, large bowel wall thickening or pericolonic fat stranding. Vascular/Lymphatic: Atherosclerotic nonaneurysmal abdominal aorta. Patent portal, splenic, hepatic and renal veins. No pathologically enlarged lymph nodes in the abdomen or pelvis. Reproductive: Mildly enlarged prostate, unchanged. Other: No pneumoperitoneum, ascites or focal fluid collection. Simple subcutaneous cystic 2.8 cm lesion in the ventral right pelvis (series 2/image 116), stable. Musculoskeletal: No aggressive appearing focal osseous lesions. Mild lumbar spondylosis. IMPRESSION: 1. Stable post treatment changes in the apical right upper lobe and posterior upper right ribs with no evidence of local tumor recurrence. 2. Stable sclerotic osseous metastases in the T3 and T4 vertebral bodies status post posterior spinal fusion. 3. Stable treated tiny liver lesions. 4. No new or progressive metastatic disease in the chest, abdomen or pelvis. 5. Chronic findings include: Three-vessel coronary atherosclerosis. Chronic mild diffuse bladder wall thickening, probably due to chronic bladder outlet obstruction by the mildly enlarged prostate. Aortic Atherosclerosis  (ICD10-I70.0) and Emphysema (ICD10-J43.9). Electronically Signed   By: JIlona SorrelM.D.   On: 03/18/2022 13:59     ASSESSMENT & PLAN:   Cancer of [place] lobe of right lung ([place] # Stage IV metastatic non-small cell lung cancer favor adeno; mets to bone; liver.    oligometastatic progression-s/p RT right upper lobe mass [until dec 15th, 2022]. AUG 21st, 2023- Stable post treatment changes in the apical right upper lobe and posterior upper right  ribs with no evidence of local tumor recurrence;  Stable sclerotic osseous metastases in the T3 and T4 vertebral bodies status post posterior spinal fusion.  Stable treated tiny liver lesions; No new or progressive metastatic disease in the chest, abdomen or pelvis.  Currently on Keytruda maintenance.  # proceed with [place] maintainance today. Labs today reviewed;  acceptable for treatment today.   # Bone metastases- on zometa 3 mg IVPB.  Currently q 3 M. Ca-8.9.  Pt awaiting possible dental extractions.  Patient understands that he will need a clearance from oncology prior to dental extractions.   # Hypothyroidism- [ Mid sep 2022]  Normal T3-T4.  Currently on 50 mcg a day; JULY  2023-3.2- STABLE.   # COPD-. On 2-3 lit O2; Trelegy.Clinically- STABLE. Marland Kitchen  #Incidental findings on Imaging  CT , August 2023: Atherosclerosis; prostatomegaly emphysema I reviewed/discussed/counseled the patient.     ZOMETA- last 5/31-q 57M;   # DISPOSITION:  #  Keytruda; # follow up in 3 weeks- labs- cbc/cmp; Keytruda;Zometa-TSH  - -Dr.B  # I reviewed the blood work- with the patient in detail; also reviewed the imaging independently [as summarized above]; and with the patient in detail.     .     All questions were answered. The patient knows to call the clinic with any problems, questions or concerns.    Cammie Sickle, MD 03/19/2022 9:47 AM

## 2022-03-19 NOTE — Progress Notes (Signed)
ON PATHWAY REGIMEN - Non-Small Cell Lung  No Change  Continue With Treatment as Ordered.  Original Decision Date/Time: 12/11/2019 18:02     A cycle is every 21 days:     Pembrolizumab      Pemetrexed      Carboplatin   **Always confirm dose/schedule in your pharmacy ordering system**  Patient Characteristics: Stage IV Metastatic, Nonsquamous, Initial Chemotherapy/Immunotherapy, PS = 0, 1, ALK Rearrangement Negative and ROS1 Rearrangement Negative and NTRK Gene Fusion?Negative and RET Gene Fusion?Negative and EGFR Mutation Negative/Non?Sensitizing, PD-L1  Expression Positive 1-49% (TPS) / Negative / Not Tested / Awaiting Test Results and Immunotherapy Candidate Therapeutic Status: Stage IV Metastatic Histology: Nonsquamous Cell ROS1 Rearrangement Status: Negative Other Mutations/Biomarkers: No Other Actionable Mutations NTRK Gene Fusion Status: Negative PD-L1 Expression Status: PD-L1 Positive 1-49% (TPS) Chemotherapy/Immunotherapy LOT: Initial Chemotherapy/Immunotherapy Molecular Targeted Therapy: Not Appropriate MET Exon 14 Mutation Status: Negative RET Gene Fusion Status: Negative ALK Rearrangement Status: Negative EGFR Mutation Status: Non-Sensitizing BRAF V600E Mutation Status: Negative ECOG Performance Status: 1 Biomarker Assessment Status Confirmation: All Genomic Markers Negative or Only MET+ or BRAF+ Immunotherapy Candidate Status: Candidate for Immunotherapy Intent of Therapy: Non-Curative / Palliative Intent, Discussed with Patient

## 2022-04-01 DIAGNOSIS — J9601 Acute respiratory failure with hypoxia: Secondary | ICD-10-CM | POA: Diagnosis not present

## 2022-04-01 DIAGNOSIS — J441 Chronic obstructive pulmonary disease with (acute) exacerbation: Secondary | ICD-10-CM | POA: Diagnosis not present

## 2022-04-02 ENCOUNTER — Other Ambulatory Visit: Payer: Self-pay | Admitting: Internal Medicine

## 2022-04-02 DIAGNOSIS — F341 Dysthymic disorder: Secondary | ICD-10-CM

## 2022-04-02 DIAGNOSIS — F419 Anxiety disorder, unspecified: Secondary | ICD-10-CM

## 2022-04-03 ENCOUNTER — Encounter: Payer: Self-pay | Admitting: Internal Medicine

## 2022-04-08 ENCOUNTER — Other Ambulatory Visit: Payer: Self-pay

## 2022-04-08 DIAGNOSIS — C3411 Malignant neoplasm of upper lobe, right bronchus or lung: Secondary | ICD-10-CM

## 2022-04-08 DIAGNOSIS — Z7189 Other specified counseling: Secondary | ICD-10-CM

## 2022-04-09 ENCOUNTER — Ambulatory Visit: Payer: Medicare HMO

## 2022-04-09 ENCOUNTER — Encounter: Payer: Self-pay | Admitting: Internal Medicine

## 2022-04-09 ENCOUNTER — Other Ambulatory Visit: Payer: Medicare HMO

## 2022-04-09 ENCOUNTER — Inpatient Hospital Stay: Payer: Medicare HMO | Attending: Internal Medicine

## 2022-04-09 ENCOUNTER — Inpatient Hospital Stay: Payer: Medicare HMO

## 2022-04-09 ENCOUNTER — Ambulatory Visit: Payer: Medicare HMO | Admitting: Internal Medicine

## 2022-04-09 ENCOUNTER — Inpatient Hospital Stay (HOSPITAL_BASED_OUTPATIENT_CLINIC_OR_DEPARTMENT_OTHER): Payer: Medicare HMO | Admitting: Internal Medicine

## 2022-04-09 VITALS — BP 149/67 | HR 84 | Temp 96.2°F | Ht 69.0 in | Wt 124.4 lb

## 2022-04-09 DIAGNOSIS — Z5112 Encounter for antineoplastic immunotherapy: Secondary | ICD-10-CM | POA: Diagnosis not present

## 2022-04-09 DIAGNOSIS — Z801 Family history of malignant neoplasm of trachea, bronchus and lung: Secondary | ICD-10-CM | POA: Diagnosis not present

## 2022-04-09 DIAGNOSIS — C3411 Malignant neoplasm of upper lobe, right bronchus or lung: Secondary | ICD-10-CM

## 2022-04-09 DIAGNOSIS — Z7189 Other specified counseling: Secondary | ICD-10-CM

## 2022-04-09 DIAGNOSIS — Z79890 Hormone replacement therapy (postmenopausal): Secondary | ICD-10-CM | POA: Diagnosis not present

## 2022-04-09 DIAGNOSIS — M2550 Pain in unspecified joint: Secondary | ICD-10-CM | POA: Insufficient documentation

## 2022-04-09 DIAGNOSIS — Z79899 Other long term (current) drug therapy: Secondary | ICD-10-CM | POA: Insufficient documentation

## 2022-04-09 DIAGNOSIS — C7951 Secondary malignant neoplasm of bone: Secondary | ICD-10-CM | POA: Insufficient documentation

## 2022-04-09 DIAGNOSIS — M47814 Spondylosis without myelopathy or radiculopathy, thoracic region: Secondary | ICD-10-CM | POA: Insufficient documentation

## 2022-04-09 DIAGNOSIS — Z87891 Personal history of nicotine dependence: Secondary | ICD-10-CM | POA: Diagnosis not present

## 2022-04-09 DIAGNOSIS — J479 Bronchiectasis, uncomplicated: Secondary | ICD-10-CM | POA: Insufficient documentation

## 2022-04-09 DIAGNOSIS — Z8249 Family history of ischemic heart disease and other diseases of the circulatory system: Secondary | ICD-10-CM | POA: Insufficient documentation

## 2022-04-09 DIAGNOSIS — N400 Enlarged prostate without lower urinary tract symptoms: Secondary | ICD-10-CM | POA: Insufficient documentation

## 2022-04-09 DIAGNOSIS — Z823 Family history of stroke: Secondary | ICD-10-CM | POA: Diagnosis not present

## 2022-04-09 DIAGNOSIS — E039 Hypothyroidism, unspecified: Secondary | ICD-10-CM | POA: Insufficient documentation

## 2022-04-09 DIAGNOSIS — Z923 Personal history of irradiation: Secondary | ICD-10-CM | POA: Insufficient documentation

## 2022-04-09 DIAGNOSIS — R0602 Shortness of breath: Secondary | ICD-10-CM | POA: Insufficient documentation

## 2022-04-09 DIAGNOSIS — M47816 Spondylosis without myelopathy or radiculopathy, lumbar region: Secondary | ICD-10-CM | POA: Diagnosis not present

## 2022-04-09 DIAGNOSIS — J432 Centrilobular emphysema: Secondary | ICD-10-CM | POA: Insufficient documentation

## 2022-04-09 DIAGNOSIS — C787 Secondary malignant neoplasm of liver and intrahepatic bile duct: Secondary | ICD-10-CM | POA: Insufficient documentation

## 2022-04-09 DIAGNOSIS — R5383 Other fatigue: Secondary | ICD-10-CM | POA: Diagnosis not present

## 2022-04-09 DIAGNOSIS — F17290 Nicotine dependence, other tobacco product, uncomplicated: Secondary | ICD-10-CM | POA: Diagnosis not present

## 2022-04-09 DIAGNOSIS — K838 Other specified diseases of biliary tract: Secondary | ICD-10-CM | POA: Insufficient documentation

## 2022-04-09 DIAGNOSIS — M549 Dorsalgia, unspecified: Secondary | ICD-10-CM | POA: Diagnosis not present

## 2022-04-09 LAB — CBC WITH DIFFERENTIAL/PLATELET
Abs Immature Granulocytes: 0.01 10*3/uL (ref 0.00–0.07)
Basophils Absolute: 0.1 10*3/uL (ref 0.0–0.1)
Basophils Relative: 1 %
Eosinophils Absolute: 0.4 10*3/uL (ref 0.0–0.5)
Eosinophils Relative: 7 %
HCT: 43.6 % (ref 39.0–52.0)
Hemoglobin: 13.8 g/dL (ref 13.0–17.0)
Immature Granulocytes: 0 %
Lymphocytes Relative: 15 %
Lymphs Abs: 0.9 10*3/uL (ref 0.7–4.0)
MCH: 30.9 pg (ref 26.0–34.0)
MCHC: 31.7 g/dL (ref 30.0–36.0)
MCV: 97.8 fL (ref 80.0–100.0)
Monocytes Absolute: 0.7 10*3/uL (ref 0.1–1.0)
Monocytes Relative: 11 %
Neutro Abs: 4.2 10*3/uL (ref 1.7–7.7)
Neutrophils Relative %: 66 %
Platelets: 150 10*3/uL (ref 150–400)
RBC: 4.46 MIL/uL (ref 4.22–5.81)
RDW: 11.9 % (ref 11.5–15.5)
WBC: 6.3 10*3/uL (ref 4.0–10.5)
nRBC: 0 % (ref 0.0–0.2)

## 2022-04-09 LAB — COMPREHENSIVE METABOLIC PANEL
ALT: 13 U/L (ref 0–44)
AST: 21 U/L (ref 15–41)
Albumin: 3.9 g/dL (ref 3.5–5.0)
Alkaline Phosphatase: 53 U/L (ref 38–126)
Anion gap: 6 (ref 5–15)
BUN: 11 mg/dL (ref 8–23)
CO2: 41 mmol/L — ABNORMAL HIGH (ref 22–32)
Calcium: 8.6 mg/dL — ABNORMAL LOW (ref 8.9–10.3)
Chloride: 91 mmol/L — ABNORMAL LOW (ref 98–111)
Creatinine, Ser: 0.45 mg/dL — ABNORMAL LOW (ref 0.61–1.24)
GFR, Estimated: >60 mL/min (ref >60)
Glucose, Bld: 99 mg/dL (ref 70–99)
Potassium: 4.1 mmol/L (ref 3.5–5.1)
Sodium: 138 mmol/L (ref 135–145)
Total Bilirubin: 0.6 mg/dL (ref 0.3–1.2)
Total Protein: 7.3 g/dL (ref 6.5–8.1)

## 2022-04-09 LAB — TSH: TSH: 3.238 u[IU]/mL (ref 0.350–4.500)

## 2022-04-09 MED ORDER — SODIUM CHLORIDE 0.9 % IV SOLN
Freq: Once | INTRAVENOUS | Status: AC
  Filled 2022-04-09: qty 250

## 2022-04-09 MED ORDER — SODIUM CHLORIDE 0.9 % IV SOLN
200.0000 mg | Freq: Once | INTRAVENOUS | Status: AC
  Administered 2022-04-09: 200 mg via INTRAVENOUS
  Filled 2022-04-09: qty 8

## 2022-04-09 MED ORDER — HEPARIN SOD (PORK) LOCK FLUSH 100 UNIT/ML IV SOLN
500.0000 [IU] | Freq: Once | INTRAVENOUS | Status: AC | PRN
  Administered 2022-04-09: 500 [IU]
  Filled 2022-04-09: qty 5

## 2022-04-09 MED ORDER — HEPARIN SOD (PORK) LOCK FLUSH 100 UNIT/ML IV SOLN
INTRAVENOUS | Status: AC
  Filled 2022-04-09: qty 5

## 2022-04-09 MED ORDER — PROCHLORPERAZINE MALEATE 10 MG PO TABS: 10.0000 mg | ORAL_TABLET | Freq: Once | ORAL | Status: DC

## 2022-04-09 NOTE — Progress Notes (Signed)
Can we do a flu shot here?

## 2022-04-09 NOTE — Patient Instructions (Signed)
[facility]  Discharge Instructions: Thank you for choosing [facility] to provide your oncology and hematology care.  If you have a lab appointment with the Cancer Center, please go directly to the Cancer Center and check in at the registration area.  Wear comfortable clothing and clothing appropriate for easy access to any Portacath or PICC line.   We strive to give you quality time with your provider. You may need to reschedule your appointment if you arrive late (15 or more minutes).  Arriving late affects you and other patients whose appointments are after yours.  Also, if you miss three or more appointments without notifying the office, you may be dismissed from the clinic at the provider's discretion.      For prescription refill requests, have your pharmacy contact our office and allow 72 hours for refills to be completed.    Today you received the following chemotherapy and/or immunotherapy agents Keytruda      To help prevent nausea and vomiting after your treatment, we encourage you to take your nausea medication as directed.  BELOW ARE SYMPTOMS THAT SHOULD BE REPORTED IMMEDIATELY: *FEVER GREATER THAN 100.4 F (38 C) OR HIGHER *CHILLS OR SWEATING *NAUSEA AND VOMITING THAT IS NOT CONTROLLED WITH YOUR NAUSEA MEDICATION *UNUSUAL SHORTNESS OF BREATH *UNUSUAL BRUISING OR BLEEDING *URINARY PROBLEMS (pain or burning when urinating, or frequent urination) *BOWEL PROBLEMS (unusual diarrhea, constipation, pain near the anus) TENDERNESS IN MOUTH AND THROAT WITH OR WITHOUT PRESENCE OF ULCERS (sore throat, sores in mouth, or a toothache) UNUSUAL RASH, SWELLING OR PAIN  UNUSUAL VAGINAL DISCHARGE OR ITCHING   Items with * indicate a potential emergency and should be followed up as soon as possible or go to the Emergency Department if any problems should occur.  Please show the CHEMOTHERAPY ALERT CARD or IMMUNOTHERAPY ALERT CARD at check-in to  the Emergency Department and triage nurse.  Should you have questions after your visit or need to cancel or reschedule your appointment, please contact [facility]  [phone] and follow the prompts.  Office hours are 8:00 a.m. to 4:30 p.m. Monday - Friday. Please note that voicemails left after 4:00 p.m. may not be returned until the following business day.  We are closed weekends and major holidays. You have access to a nurse at all times for urgent questions. Please call the main number to the clinic [phone] and follow the prompts.  For any non-urgent questions, you may also contact your provider using MyChart. We now offer e-Visits for anyone 18 and older to request care online for non-urgent symptoms. For details visit [email or web address].   Also download the MyChart app! Go to the app store, search "MyChart", open the app, select [facility], and log in with your MyChart username and password.  Masks are optional in the cancer centers. If you would like for your care team to wear a mask while they are taking care of you, please let them know. For doctor visits, patients may have with them one support person who is at least 68 years old. At this time, visitors are not allowed in the infusion area.   

## 2022-04-09 NOTE — Progress Notes (Signed)
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   # 2021- MAY 26th-Keytrda x2 cycles; [borderline PS] July 7th-carbo Alimta Keytruda cycle #1  #November 2022-oligometastatic progression-right upper lobe subpleural involvement with; s/p radiation finished December 15.  Temporarily held [place].  #Jan fourth 2023-restarted Keytruda.  # NGS/MOLECULAR TESTS:   # PALLIATIVE CARE EVALUATION:  # PAIN MANAGEMENT:    DIAGNOSIS:   STAGE:         ;  GOALS:  CURRENT/MOST RECENT THERAPY :     Cancer of upper lobe of right lung ([place])  11/23/2019 Initial Diagnosis   Cancer of upper lobe of right lung ([place])   12/16/2019 -  Chemotherapy   Patient is on Treatment Plan : LUNG Carboplatin (5) + Pemetrexed (500) + Pembrolizumab (200) D1 q21d Induction x 4 cycles / Maintenance Pemetrexed (500) + Pembrolizumab (200) D1 q21d     12/21/2019 - 02/26/2022 Chemotherapy   Patient is on Treatment Plan : LUNG CARBOplatin / Pemetrexed / Pembrolizumab q21d Induction x 4 cycles / Maintenance Pemetrexed + Pembrolizumab      HISTORY OF PRESENTING ILLNESS: Thin built frail appearing male patient.  No acute distress.  Alone.   Nasal cannula oxygen.  Ambulating independently.    Cody [name] 66 y.o.  male patient  with metastatic non-small cell lung cancer; cord compression status post decompressive surgery; currently on Keytruda maintenance-is here for follow-up.   Denies any new complaints. No diarrhea.  No nausea no vomiting no rash.  No worsening shortness of breath.  Review of Systems  Constitutional:  Positive for malaise/fatigue. Negative for chills, diaphoresis and fever.  HENT:  Negative for nosebleeds and sore throat.   Eyes:  Negative for double vision.  Respiratory:  Positive for shortness of breath. Negative for cough, hemoptysis, sputum production and wheezing.   Cardiovascular:  Negative for chest pain, palpitations, orthopnea and leg swelling.  Gastrointestinal:  Negative for abdominal pain, blood in stool, constipation, heartburn, melena, nausea and vomiting.  Genitourinary:  Negative for dysuria, frequency and urgency.  Musculoskeletal:  Positive for back pain and joint pain.  Skin: Negative.  Negative for itching and rash.  Neurological:  Negative for dizziness, tingling, focal weakness, weakness and headaches.  Endo/Heme/Allergies:  Does not bruise/bleed easily.  Psychiatric/Behavioral:  Negative for depression. The patient is not nervous/anxious and does not have insomnia.      MEDICAL HISTORY:  Past Medical History:  Diagnosis Date   Allergy    Cancer of upper lobe of right lung ([place]) 10/2019   COPD (chronic obstructive pulmonary disease) (HCC)    Prostate enlargement     SURGICAL HISTORY: Past Surgical History:  Procedure Laterality Date   HERNIA REPAIR Bilateral 1982   HERNIA REPAIR Right 1994   IR IMAGING GUIDED PORT INSERTION  02/24/2020   LAMINECTOMY FOR EXCISION / EVACUATION INTRASPINAL LESION  11/07/2019   [facility]T 6 [facility]  SOCIAL HISTORY: Social History   Socioeconomic History   Marital status: Married    Spouse name: [name]    Number of children: 4   Years of education: Not on file   Highest education level: Some college, no degree  Occupational  History   Occupation: dispatch and delivery   Tobacco Use   Smoking status: Former    Packs/day: 2.00    Years: 30.00    Total pack years: 60.00    Types: Cigarettes    Quit date: 07/29/2011    Years since quitting: 10.7   Smokeless tobacco: Never   Tobacco comments:    pt vapes  Vaping Use   Vaping Use: Not on file  Substance and Sexual Activity   Alcohol use: Yes    Alcohol/week: 5.0 standard drinks of alcohol    Types: 5 Standard drinks or equivalent per week    Comment: 1-2/day   Drug use: No   Sexual activity: Not Currently    Partners: Female  Other Topics Concern   Not on file  Social History Narrative   Worked in warehouse/ quit smoking in 2015; beer 1/day; in [place]; with wife.    Social Determinants of Health   Financial Resource Strain: Low Risk  (10/21/2019)   Overall Financial Resource Strain (CARDIA)    Difficulty of Paying Living Expenses: Not hard at all  Food Insecurity: No Food Insecurity (10/21/2019)   Hunger Vital Sign    Worried About Running Out of Food in the Last Year: Never true    Ran Out of Food in the Last Year: Never true  Transportation Needs: No Transportation Needs (10/21/2019)   PRAPARE - Hydrologist (Medical): No    Lack of Transportation (Non-Medical): No  Physical Activity: Insufficiently Active (11/25/2019)   Exercise Vital Sign    Days of Exercise per Week: 7 days    Minutes of Exercise per Session: 20 min  Stress: No Stress Concern Present (10/21/2019)   [place]    Feeling of Stress : Not at all  Social Connections: [place] (10/21/2019)   Social Connection and Isolation Panel [NHANES]    Frequency of Communication with Friends and Family: More than three times a week    Frequency of Social Gatherings with Friends and Family: More than three times a week    Attends Religious Services: More than 4 times per year    Active  Member of [employer] or Organizations: Yes    Attends Music therapist: More than 4 times per year    Marital Status: Married  Human resources officer Violence: Not At Risk (10/21/2019)   Humiliation, Afraid, Rape, and Kick questionnaire    Fear of Current or Ex-Partner: No    Emotionally Abused: No    Physically Abused: No    Sexually Abused: No    FAMILY HISTORY: Family History  Problem Relation Age of Onset   Heart disease Father    CVA Mother    Lung cancer Sister    Lung cancer Brother     ALLERGIES:  has No Known Allergies.  MEDICATIONS:  Current Outpatient Medications  Medication Sig Dispense Refill   acetaminophen (TYLENOL) 500 MG tablet Take 500-1,000 mg by mouth every 6 (six) hours as needed for mild pain or fever.      albuterol (VENTOLIN HFA) 108 (90 Base) MCG/ACT inhaler Inhale 2 puffs into the lungs every 4 (four) hours as needed for  wheezing or shortness of breath. 18 g 0   Calcium Carb-Cholecalciferol (OYSTER SHELL CALCIUM) 500-400 MG-UNIT TABS Take 500 tablets by mouth.     escitalopram (LEXAPRO) 5 MG tablet TAKE ONE TABLET BY MOUTH EVERY MORNING 90 tablet 0   Fluticasone-Umeclidin-Vilant (TRELEGY ELLIPTA) 100-62.5-25 MCG/INH AEPB Inhale 1 puff into the lungs daily. 180 each 1   levothyroxine (SYNTHROID) 50 MCG tablet TAKE ONE TABLET BY MOUTH DAILY BEFORE BREAKFAST 90 tablet 2   lidocaine-prilocaine (EMLA) cream Apply 1 application topically as needed. Apply small amount to port site at least 1 hour prior to it being accessed, cover with plastic wrap 30 g 1   Multiple Vitamin (MULTIVITAMIN) tablet Take 1 tablet by mouth daily.     QUEtiapine (SEROQUEL) 25 MG tablet Take 1 tablet (25 mg total) by mouth at bedtime. 90 tablet 1   No current facility-administered medications for this visit.   Facility-Administered Medications Ordered in Other Visits  Medication Dose Route Frequency Provider Last Rate Last Admin   heparin lock flush 100 unit/mL  500 Units  Intracatheter Once PRN Cammie Sickle, MD       pembrolizumab [facility]) 200 mg in sodium chloride 0.9 % 50 mL chemo infusion  200 mg Intravenous Once Cammie Sickle, MD 116 mL/hr at 04/09/22 1403 200 mg at 04/09/22 1403   prochlorperazine (COMPAZINE) tablet 10 mg  10 mg Oral Once Cammie Sickle, MD          .  PHYSICAL EXAMINATION: ECOG PERFORMANCE STATUS: 3 - Symptomatic, >50% confined to bed  Vitals:   04/09/22 1307  BP: (!) 149/67  Pulse: 84  Temp: (!) 96.2 F (35.7 C)  SpO2: 95%   Filed Weights   04/09/22 1307  Weight: 124 lb 6.4 oz (56.4 kg)    Physical Exam HENT:     Head: Normocephalic and atraumatic.     Mouth/Throat:     Pharynx: No oropharyngeal exudate.  Eyes:     Pupils: Pupils are equal, round, and reactive to light.  Cardiovascular:     Rate and Rhythm: Normal rate and regular rhythm.  Pulmonary:     Effort: No respiratory distress.     Breath sounds: No wheezing.     Comments: Decreased air entry bilaterally. Abdominal:     General: Bowel sounds are normal. There is no distension.     Palpations: Abdomen is soft. There is no mass.     Tenderness: There is no abdominal tenderness. There is no guarding or rebound.  Musculoskeletal:        General: No tenderness. Normal range of motion.     Cervical back: Normal range of motion and neck supple.  Skin:    General: Skin is warm.  Neurological:     Mental Status: He is alert and oriented to person, place, and time.  Psychiatric:        Mood and Affect: Affect normal.      LABORATORY DATA:  I have reviewed the data as listed Lab Results  Component Value Date   WBC 6.3 04/09/2022   HGB 13.8 04/09/2022   HCT 43.6 04/09/2022   MCV 97.8 04/09/2022   PLT 150 04/09/2022   Recent Labs    02/26/22 0905 03/19/22 0907 04/09/22 1239  NA 139 137 138  K 4.2 4.2 4.1  CL 90* 89* 91*  CO2 42* 38* 41*  GLUCOSE 101* 96 99  BUN _0 CREATININE 0.55* 0.48* 0.45*  CALCIUM 9.0  8.9  8.6*  GFRNONAA >60 >60 >60  PROT 7.6 7.6 7.3  ALBUMIN 4.2 4.1 3.9  AST _0 ALT _1 ALKPHOS 52 53 53  BILITOT 0.5 0.5 0.6    RADIOGRAPHIC STUDIES: I have personally reviewed the radiological images as listed and agreed with the findings in the report. CT CHEST ABDOMEN PELVIS W CONTRAST  Result Date: 03/18/2022 CLINICAL DATA:  Stage IV non-small cell right upper lobe lung cancer, with ongoing Keytruda therapy. Restaging. * Tracking Code: BO * EXAM: CT CHEST, ABDOMEN, AND PELVIS WITH CONTRAST TECHNIQUE: Multidetector CT imaging of the chest, abdomen and pelvis was performed following the standard protocol during bolus administration of intravenous contrast. RADIATION DOSE REDUCTION: This exam was performed according to the departmental dose-optimization program which includes automated exposure control, adjustment of the mA and/or kV according to patient size and/or use of iterative reconstruction technique. CONTRAST:  51m OMNIPAQUE IOHEXOL 300 MG/ML  SOLN COMPARISON:  12/11/2021 CT chest, abdomen and pelvis. FINDINGS: CT CHEST FINDINGS Cardiovascular: Normal heart size. No significant pericardial effusion/thickening. Right internal jugular Port-A-Cath terminates at the cavoatrial junction. Three-vessel coronary atherosclerosis. Atherosclerotic nonaneurysmal thoracic aorta. Normal caliber pulmonary arteries. No central pulmonary emboli. Mediastinum/Nodes: No discrete thyroid nodules. Unremarkable esophagus. No pathologically enlarged axillary, mediastinal or hilar lymph nodes. Lungs/Pleura: No pneumothorax. No pleural effusion. Severe centrilobular emphysema. Irregular bandlike consolidation in the apical right upper lobe with associated volume loss, bronchiectasis and distortion, unchanged, compatible with post treatment change. Patchy subpleural reticulation and subpleural lines in the dependent basilar right lower lobe, mildly increased, compatible with a combination of nonspecific  fibrosis and atelectasis. No acute consolidative airspace disease or new significant pulmonary nodules. Musculoskeletal: Stable postsurgical changes from bilateral posterior spinal fusion T1-T6. Stable sclerotic T3 and T4 vertebral lesions with associated chronic pathologic fractures. Stable sclerotic change throughout the posterior right third, fourth and fifth ribs. No new focal osseous lesions in the chest. Mild thoracic spondylosis. CT ABDOMEN PELVIS FINDINGS Hepatobiliary: Normal liver size. Chronic mild intrahepatic biliary ductal dilatation near the gallbladder. Otherwise no intrahepatic or extrahepatic biliary ductal dilatation. Scattered subcentimeter hypodense liver lesions (series 2/images 62 and 67) are stable. No new liver lesions. Normal gallbladder with no radiopaque cholelithiasis. No biliary ductal dilatation. Pancreas: Normal, with no mass or duct dilation. Spleen: Normal size. No mass. Adrenals/Urinary Tract: Normal kidneys with no hydronephrosis and no renal mass. Nondistended bladder with chronic mild diffuse bladder wall thickening. Stomach/Bowel: Normal non-distended stomach. Normal caliber small bowel with no small bowel wall thickening. Normal appendix. Oral contrast transits to the colon. Normal large bowel with no diverticulosis, large bowel wall thickening or pericolonic fat stranding. Vascular/Lymphatic: Atherosclerotic nonaneurysmal abdominal aorta. Patent portal, splenic, hepatic and renal veins. No pathologically enlarged lymph nodes in the abdomen or pelvis. Reproductive: Mildly enlarged prostate, unchanged. Other: No pneumoperitoneum, ascites or focal fluid collection. Simple subcutaneous cystic 2.8 cm lesion in the ventral right pelvis (series 2/image 116), stable. Musculoskeletal: No aggressive appearing focal osseous lesions. Mild lumbar spondylosis. IMPRESSION: 1. Stable post treatment changes in the apical right upper lobe and posterior upper right ribs with no evidence of  local tumor recurrence. 2. Stable sclerotic osseous metastases in the T3 and T4 vertebral bodies status post posterior spinal fusion. 3. Stable treated tiny liver lesions. 4. No new or progressive metastatic disease in the chest, abdomen or pelvis. 5. Chronic findings include: Three-vessel coronary atherosclerosis. Chronic mild diffuse bladder wall thickening, probably due to chronic bladder outlet obstruction by the mildly enlarged prostate.  Aortic Atherosclerosis (ICD10-I70.0) and Emphysema (ICD10-J43.9). Electronically Signed   By: Ilona Sorrel M.D.   On: 03/18/2022 13:59     ASSESSMENT & PLAN:   Cancer of upper lobe of right lung ([place]) # Stage IV metastatic non-small cell lung cancer favor adeno; mets to bone; liver.    oligometastatic progression-s/p RT right upper lobe mass [until dec 15th, 2022]. AUG 21st, 2023- Stable post treatment changes in the apical right upper lobe and posterior upper right ribs with no evidence of local tumor recurrence;  Stable sclerotic osseous metastases in the T3 and T4 vertebral bodies status post posterior spinal fusion.  Stable treated tiny liver lesions; No new or progressive metastatic disease in the chest, abdomen or pelvis.  Currently on Keytruda maintenance.  # proceed with [place] maintainance today. Labs today reviewed;  acceptable for treatment today.   # Bone metastases- on zometa 3 mg IVPB.  Currently q 3 M. Ca-8.6.  Pt awaiting possible dental extractions.  Patient understands that he will need a clearance from oncology prior to dental extractions. HOLD Zometa today.  Recommend multivitamin with calcium plus vitamin D 3 times a day.   # Hypothyroidism- [ Mid sep 2022]  Normal T3-T4.  Currently on 50 mcg a day; JULY  2023-3.2- STABLE.   # COPD-. On 2-3 lit O2; Trelegy.Clinically- STABLE. Marland Kitchen    ZOMETA- last 5/31-q 104M;   # DISPOSITION:  #  Keytruda; HOLD zometa today # follow up in 3 weeks- labs- cbc/cmp ; vit D 25-OH levels; Keytruda;Zometa--  -Dr.B     .     All questions were answered. The patient knows to call the clinic with any problems, questions or concerns.    Cammie Sickle, MD 04/09/2022 2:04 PM

## 2022-04-09 NOTE — Assessment & Plan Note (Addendum)
#   Stage IV metastatic non-small cell lung cancer favor adeno; mets to bone; liver.    oligometastatic progression-s/p RT right upper lobe mass [until dec 15th, 2022]. AUG 21st, 2023- Stable post treatment changes in the apical right upper lobe and posterior upper right ribs with no evidence of local tumor recurrence;  Stable sclerotic osseous metastases in the T3 and T4 vertebral bodies status post posterior spinal fusion.  Stable treated tiny liver lesions; No new or progressive metastatic disease in the chest, abdomen or pelvis.  Currently on Keytruda maintenance.  # proceed with [place] maintainance today. Labs today reviewed;  acceptable for treatment today.   # Bone metastases- on zometa 3 mg IVPB.  Currently q 3 M. Ca-8.6.  Pt awaiting possible dental extractions.  Patient understands that he will need a clearance from oncology prior to dental extractions. HOLD Zometa today.  Recommend multivitamin with calcium plus vitamin D 3 times a day.   # Hypothyroidism- [ Mid sep 2022]  Normal T3-T4.  Currently on 50 mcg a day; JULY  2023-3.2- STABLE.   # COPD-. On 2-3 lit O2; Trelegy.Clinically- STABLE. Marland Kitchen    ZOMETA- last 5/31-q 105M;   # DISPOSITION:  #  Keytruda; HOLD zometa today # follow up in 3 weeks- labs- cbc/cmp ; vit D 25-OH levels; Keytruda;Zometa-- -Dr.B     .

## 2022-04-30 ENCOUNTER — Inpatient Hospital Stay: Payer: Medicare HMO

## 2022-04-30 ENCOUNTER — Encounter: Payer: Self-pay | Admitting: Internal Medicine

## 2022-04-30 ENCOUNTER — Inpatient Hospital Stay: Payer: Medicare HMO | Attending: Internal Medicine | Admitting: Internal Medicine

## 2022-04-30 VITALS — BP 134/79 | HR 86 | Temp 96.8°F | Resp 18 | Wt 122.0 lb

## 2022-04-30 DIAGNOSIS — E440 Moderate protein-calorie malnutrition: Secondary | ICD-10-CM | POA: Diagnosis not present

## 2022-04-30 DIAGNOSIS — Z801 Family history of malignant neoplasm of trachea, bronchus and lung: Secondary | ICD-10-CM | POA: Insufficient documentation

## 2022-04-30 DIAGNOSIS — C787 Secondary malignant neoplasm of liver and intrahepatic bile duct: Secondary | ICD-10-CM | POA: Insufficient documentation

## 2022-04-30 DIAGNOSIS — F17210 Nicotine dependence, cigarettes, uncomplicated: Secondary | ICD-10-CM | POA: Diagnosis not present

## 2022-04-30 DIAGNOSIS — M549 Dorsalgia, unspecified: Secondary | ICD-10-CM | POA: Insufficient documentation

## 2022-04-30 DIAGNOSIS — Z7189 Other specified counseling: Secondary | ICD-10-CM | POA: Diagnosis not present

## 2022-04-30 DIAGNOSIS — Z5112 Encounter for antineoplastic immunotherapy: Secondary | ICD-10-CM | POA: Insufficient documentation

## 2022-04-30 DIAGNOSIS — Z23 Encounter for immunization: Secondary | ICD-10-CM

## 2022-04-30 DIAGNOSIS — R0602 Shortness of breath: Secondary | ICD-10-CM | POA: Diagnosis not present

## 2022-04-30 DIAGNOSIS — E039 Hypothyroidism, unspecified: Secondary | ICD-10-CM | POA: Insufficient documentation

## 2022-04-30 DIAGNOSIS — M2550 Pain in unspecified joint: Secondary | ICD-10-CM | POA: Diagnosis not present

## 2022-04-30 DIAGNOSIS — C3411 Malignant neoplasm of upper lobe, right bronchus or lung: Secondary | ICD-10-CM

## 2022-04-30 DIAGNOSIS — Z823 Family history of stroke: Secondary | ICD-10-CM | POA: Diagnosis not present

## 2022-04-30 DIAGNOSIS — Z923 Personal history of irradiation: Secondary | ICD-10-CM | POA: Insufficient documentation

## 2022-04-30 DIAGNOSIS — Z8249 Family history of ischemic heart disease and other diseases of the circulatory system: Secondary | ICD-10-CM | POA: Insufficient documentation

## 2022-04-30 DIAGNOSIS — C7951 Secondary malignant neoplasm of bone: Secondary | ICD-10-CM | POA: Diagnosis not present

## 2022-04-30 DIAGNOSIS — Z79890 Hormone replacement therapy (postmenopausal): Secondary | ICD-10-CM | POA: Diagnosis not present

## 2022-04-30 DIAGNOSIS — Z79899 Other long term (current) drug therapy: Secondary | ICD-10-CM | POA: Diagnosis not present

## 2022-04-30 DIAGNOSIS — J449 Chronic obstructive pulmonary disease, unspecified: Secondary | ICD-10-CM | POA: Diagnosis not present

## 2022-04-30 DIAGNOSIS — R5383 Other fatigue: Secondary | ICD-10-CM | POA: Diagnosis not present

## 2022-04-30 LAB — CBC WITH DIFFERENTIAL/PLATELET
Abs Immature Granulocytes: 0.02 10*3/uL (ref 0.00–0.07)
Basophils Absolute: 0.1 10*3/uL (ref 0.0–0.1)
Basophils Relative: 1 %
Eosinophils Absolute: 0.6 10*3/uL — ABNORMAL HIGH (ref 0.0–0.5)
Eosinophils Relative: 8 %
HCT: 44.1 % (ref 39.0–52.0)
Hemoglobin: 14.2 g/dL (ref 13.0–17.0)
Immature Granulocytes: 0 %
Lymphocytes Relative: 16 %
Lymphs Abs: 1.2 10*3/uL (ref 0.7–4.0)
MCH: 31.3 pg (ref 26.0–34.0)
MCHC: 32.2 g/dL (ref 30.0–36.0)
MCV: 97.1 fL (ref 80.0–100.0)
Monocytes Absolute: 0.7 10*3/uL (ref 0.1–1.0)
Monocytes Relative: 10 %
Neutro Abs: 4.7 10*3/uL (ref 1.7–7.7)
Neutrophils Relative %: 65 %
Platelets: 181 10*3/uL (ref 150–400)
RBC: 4.54 MIL/uL (ref 4.22–5.81)
RDW: 12.2 % (ref 11.5–15.5)
WBC: 7.2 10*3/uL (ref 4.0–10.5)
nRBC: 0 % (ref 0.0–0.2)

## 2022-04-30 LAB — VITAMIN D 25 HYDROXY (VIT D DEFICIENCY, FRACTURES): Vit D, 25-Hydroxy: 33.47 ng/mL (ref 30–100)

## 2022-04-30 LAB — COMPREHENSIVE METABOLIC PANEL
ALT: 14 U/L (ref 0–44)
AST: 22 U/L (ref 15–41)
Albumin: 4.1 g/dL (ref 3.5–5.0)
Alkaline Phosphatase: 56 U/L (ref 38–126)
Anion gap: 8 (ref 5–15)
BUN: 7 mg/dL — ABNORMAL LOW (ref 8–23)
CO2: 42 mmol/L — ABNORMAL HIGH (ref 22–32)
Calcium: 9.3 mg/dL (ref 8.9–10.3)
Chloride: 88 mmol/L — ABNORMAL LOW (ref 98–111)
Creatinine, Ser: 0.58 mg/dL — ABNORMAL LOW (ref 0.61–1.24)
GFR, Estimated: >60 mL/min (ref >60)
Glucose, Bld: 96 mg/dL (ref 70–99)
Potassium: 4.3 mmol/L (ref 3.5–5.1)
Sodium: 138 mmol/L (ref 135–145)
Total Bilirubin: 0.6 mg/dL (ref 0.3–1.2)
Total Protein: 7.6 g/dL (ref 6.5–8.1)

## 2022-04-30 MED ORDER — SODIUM CHLORIDE 0.9 % IV SOLN
200.0000 mg | Freq: Once | INTRAVENOUS | Status: AC
  Administered 2022-04-30: 200 mg via INTRAVENOUS
  Filled 2022-04-30: qty 8

## 2022-04-30 MED ORDER — INFLUENZA VAC A&B SA ADJ QUAD 0.5 ML IM PRSY
0.5000 mL | PREFILLED_SYRINGE | Freq: Once | INTRAMUSCULAR | Status: AC
  Administered 2022-04-30: 0.5 mL via INTRAMUSCULAR
  Filled 2022-04-30: qty 0.5

## 2022-04-30 MED ORDER — SODIUM CHLORIDE 0.9% FLUSH
10.0000 mL | Freq: Once | INTRAVENOUS | Status: AC
  Administered 2022-04-30: 10 mL via INTRAVENOUS
  Filled 2022-04-30: qty 10

## 2022-04-30 MED ORDER — HEPARIN SOD (PORK) LOCK FLUSH 100 UNIT/ML IV SOLN
500.0000 [IU] | Freq: Once | INTRAVENOUS | Status: AC | PRN
  Administered 2022-04-30: 500 [IU]
  Filled 2022-04-30: qty 5

## 2022-04-30 MED ORDER — SODIUM CHLORIDE 0.9 % IV SOLN
Freq: Once | INTRAVENOUS | Status: AC
  Filled 2022-04-30: qty 250

## 2022-04-30 NOTE — Progress Notes (Signed)
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   # 2021- MAY 26th-Keytrda x2 cycles; [borderline PS] July 7th-carbo Alimta Keytruda cycle #1  #November 2022-oligometastatic progression-right upper lobe subpleural involvement with; s/p radiation finished December 15.  Temporarily held [place].  #Jan fourth 2023-restarted Keytruda.  # NGS/MOLECULAR TESTS:   # PALLIATIVE CARE EVALUATION:  # PAIN MANAGEMENT:    DIAGNOSIS:   STAGE:         ;  GOALS:  CURRENT/MOST RECENT THERAPY :     Cancer of upper lobe of right lung ([place])  11/23/2019 Initial Diagnosis   Cancer of upper lobe of right lung ([place])   12/16/2019 -  Chemotherapy   Patient is on Treatment Plan : LUNG Carboplatin (5) + Pemetrexed (500) + Pembrolizumab (200) D1 q21d Induction x 4 cycles / Maintenance Pemetrexed (500) + Pembrolizumab (200) D1 q21d     12/21/2019 - 02/26/2022 Chemotherapy   Patient is on Treatment Plan : LUNG CARBOplatin / Pemetrexed / Pembrolizumab q21d Induction x 4 cycles / Maintenance Pemetrexed + Pembrolizumab      HISTORY OF PRESENTING ILLNESS: Thin built frail appearing male patient.  No acute distress.  Alone.   Nasal cannula oxygen.  Ambulating independently.    Cody [name] 66 y.o.  male patient  with metastatic non-small cell lung cancer; cord compression status post decompressive surgery; currently on Keytruda maintenance-is here for follow-up.   Patient had a tooth fell off.  He is awaiting to see dental surgeon for extraction.  Denies any new complaints. No diarrhea.  No nausea no vomiting no rash.  No worsening shortness of breath.  Review of Systems  Constitutional:  Positive for malaise/fatigue. Negative for chills, diaphoresis and fever.  HENT:  Negative for nosebleeds and sore throat.   Eyes:  Negative for double vision.  Respiratory:  Positive for shortness of breath. Negative for cough, hemoptysis, sputum production and wheezing.   Cardiovascular:  Negative for chest pain, palpitations, orthopnea and leg swelling.  Gastrointestinal:  Negative for abdominal pain, blood in stool, constipation, heartburn, melena, nausea and vomiting.  Genitourinary:  Negative for dysuria, frequency and urgency.  Musculoskeletal:  Positive for back pain and joint pain.  Skin: Negative.  Negative for itching and rash.  Neurological:  Negative for dizziness, tingling, focal weakness, weakness and headaches.  Endo/Heme/Allergies:  Does not bruise/bleed easily.  Psychiatric/Behavioral:  Negative for depression. The patient is not nervous/anxious and does not have insomnia.      MEDICAL HISTORY:  Past Medical History:  Diagnosis Date   Allergy    Cancer of upper lobe of right lung ([place]) 10/2019   COPD (chronic obstructive pulmonary disease) (HCC)    Prostate enlargement     SURGICAL HISTORY: Past Surgical History:  Procedure Laterality Date   HERNIA REPAIR Bilateral 1982   HERNIA REPAIR Right 1994   IR IMAGING GUIDED PORT INSERTION  02/24/2020  LAMINECTOMY FOR EXCISION / EVACUATION INTRASPINAL LESION  11/07/2019   T1-T 6 at [facility]     SOCIAL HISTORY: Social History   Socioeconomic History   Marital status: Married    Spouse name: [name]    Number of children: 4   Years of  education: Not on file   Highest education level: Some college, no degree  Occupational History   Occupation: dispatch and delivery   Tobacco Use   Smoking status: Former    Packs/day: 2.00    Years: 30.00    Total pack years: 60.00    Types: Cigarettes    Quit date: 07/29/2011    Years since quitting: 10.7   Smokeless tobacco: Never   Tobacco comments:    pt vapes  Vaping Use   Vaping Use: Not on file  Substance and Sexual Activity   Alcohol use: Yes    Alcohol/week: 5.0 standard drinks of alcohol    Types: 5 Standard drinks or equivalent per week    Comment: 1-2/day   Drug use: No   Sexual activity: Not Currently    Partners: Female  Other Topics Concern   Not on file  Social History Narrative   Worked in warehouse/ quit smoking in 2015; beer 1/day; in [place]; with wife.    Social Determinants of Health   Financial Resource Strain: Low Risk  (10/21/2019)   Overall Financial Resource Strain (CARDIA)    Difficulty of Paying Living Expenses: Not hard at all  Food Insecurity: No Food Insecurity (10/21/2019)   Hunger Vital Sign    Worried About Running Out of Food in the Last Year: Never true    Ran Out of Food in the Last Year: Never true  Transportation Needs: No Transportation Needs (10/21/2019)   PRAPARE - Hydrologist (Medical): No    Lack of Transportation (Non-Medical): No  Physical Activity: Insufficiently Active (11/25/2019)   Exercise Vital Sign    Days of Exercise per Week: 7 days    Minutes of Exercise per Session: 20 min  Stress: No Stress Concern Present (10/21/2019)   [place]    Feeling of Stress : Not at all  Social Connections: [place] (10/21/2019)   Social Connection and Isolation Panel [NHANES]    Frequency of Communication with Friends and Family: More than three times a week    Frequency of Social Gatherings with Friends and Family: More than  three times a week    Attends Religious Services: More than 4 times per year    Active Member of [employer] or Organizations: Yes    Attends Music therapist: More than 4 times per year    Marital Status: Married  Human resources officer Violence: Not At Risk (10/21/2019)   Humiliation, Afraid, Rape, and Kick questionnaire    Fear of Current or Ex-Partner: No    Emotionally Abused: No    Physically Abused: No    Sexually Abused: No    FAMILY HISTORY: Family History  Problem Relation Age of Onset   Heart disease Father    CVA Mother    Lung cancer Sister    Lung cancer Brother     ALLERGIES:  has No Known Allergies.  MEDICATIONS:  Current Outpatient Medications  Medication Sig Dispense Refill   acetaminophen (TYLENOL) 500 MG tablet Take 500-1,000 mg by mouth every 6 (six) hours as needed for mild pain or fever.      albuterol (VENTOLIN  HFA) 108 (90 Base) MCG/ACT inhaler Inhale 2 puffs into the lungs every 4 (four) hours as needed for wheezing or shortness of breath. 18 g 0   Calcium Carb-Cholecalciferol (OYSTER SHELL CALCIUM) 500-400 MG-UNIT TABS Take 500 tablets by mouth.     escitalopram (LEXAPRO) 5 MG tablet TAKE ONE TABLET BY MOUTH EVERY MORNING 90 tablet 0   Fluticasone-Umeclidin-Vilant (TRELEGY ELLIPTA) 100-62.5-25 MCG/INH AEPB Inhale 1 puff into the lungs daily. 180 each 1   levothyroxine (SYNTHROID) 50 MCG tablet TAKE ONE TABLET BY MOUTH DAILY BEFORE BREAKFAST 90 tablet 2   lidocaine-prilocaine (EMLA) cream Apply 1 application topically as needed. Apply small amount to port site at least 1 hour prior to it being accessed, cover with plastic wrap 30 g 1   Multiple Vitamin (MULTIVITAMIN) tablet Take 1 tablet by mouth daily.     QUEtiapine (SEROQUEL) 25 MG tablet Take 1 tablet (25 mg total) by mouth at bedtime. 90 tablet 1   No current facility-administered medications for this visit.      Marland Kitchen  PHYSICAL EXAMINATION: ECOG PERFORMANCE STATUS: 3 - Symptomatic, >50% confined  to bed  Vitals:   04/30/22 1400  BP: 134/79  Pulse: 86  Resp: 18  Temp: (!) 96.8 F (36 C)  SpO2: 99%   Filed Weights   04/30/22 1400  Weight: 122 lb (55.3 kg)    Physical Exam HENT:     Head: Normocephalic and atraumatic.     Mouth/Throat:     Pharynx: No oropharyngeal exudate.  Eyes:     Pupils: Pupils are equal, round, and reactive to light.  Cardiovascular:     Rate and Rhythm: Normal rate and regular rhythm.  Pulmonary:     Effort: No respiratory distress.     Breath sounds: No wheezing.     Comments: Decreased air entry bilaterally. Abdominal:     General: Bowel sounds are normal. There is no distension.     Palpations: Abdomen is soft. There is no mass.     Tenderness: There is no abdominal tenderness. There is no guarding or rebound.  Musculoskeletal:        General: No tenderness. Normal range of motion.     Cervical back: Normal range of motion and neck supple.  Skin:    General: Skin is warm.  Neurological:     Mental Status: He is alert and oriented to person, place, and time.  Psychiatric:        Mood and Affect: Affect normal.      LABORATORY DATA:  I have reviewed the data as listed Lab Results  Component Value Date   WBC 6.3 04/09/2022   HGB 13.8 04/09/2022   HCT 43.6 04/09/2022   MCV 97.8 04/09/2022   PLT 150 04/09/2022   Recent Labs    03/19/22 0907 04/09/22 1239 04/30/22 1422  NA 137 138 138  K 4.2 4.1 4.3  CL 89* 91* 88*  CO2 38* 41* 42*  GLUCOSE 96 99 96  BUN 8 11 7*  CREATININE 0.48* 0.45* 0.58*  CALCIUM 8.9 8.6* 9.3  GFRNONAA >60 >60 >60  PROT 7.6 7.3 7.6  ALBUMIN 4.1 3.9 4.1  AST 21 21 22   ALT 14 13 14   ALKPHOS 53 53 56  BILITOT 0.5 0.6 0.6    RADIOGRAPHIC STUDIES: I have personally reviewed the radiological images as listed and agreed with the findings in the report. No results found.   ASSESSMENT & PLAN:   Cancer of upper lobe of right lung (  [place]) # Stage IV metastatic non-small cell lung cancer favor  adeno; mets to bone; liver.    oligometastatic progression-s/p RT right upper lobe mass [until dec 15th, 2022]. AUG 21st, 2023- Stable post treatment changes in the apical right upper lobe and posterior upper right ribs with no evidence of local tumor recurrence;  Stable sclerotic osseous metastases in the T3 and T4 vertebral bodies status post posterior spinal fusion.  Stable treated tiny liver lesions; No new or progressive metastatic disease in the chest, abdomen or pelvis.  Currently on Keytruda maintenance.  # proceed with [place] maintainance today. Labs today reviewed;  acceptable for treatment today.  We will plan again reimaging in November  # Bone metastases- on zometa 3 mg IVPB.  Currently q 3 M. Ca-9.3.  Pt awaiting possible dental extractions.  Patient's last last Zometa- last 12/25/2021.  Patient okay to proceed with dental extractions given approximately 4 months since the last Zometa infusion.  Continue multivitamin with calcium plus vitamin D 3 times a day. Oct 9th, dental evaluation/dentures. Contact infor given.   # Hypothyroidism- [ Mid sep 2022]  Normal T3-T4.  Currently on 50 mcg a day; SEP 2023- WNL-STABLE.   # COPD-. On 2-3 lit O2; Trelegy.Clinically- STABLE. Marland Kitchen    ZOMETA- last 5/31-q 73M;   # DISPOSITION:  # Flu shot today #  Keytruda; HOLD zometa today # follow up in 3 weeks-MD;  labs- cbc/cmp; ; Keytruda-- -Dr.B     .      All questions were answered. The patient knows to call the clinic with any problems, questions or concerns.    Cammie Sickle, MD 04/30/2022 3:20 PM

## 2022-04-30 NOTE — Assessment & Plan Note (Addendum)
#   Stage IV metastatic non-small cell lung cancer favor adeno; mets to bone; liver.    oligometastatic progression-s/p RT right upper lobe mass [until dec 15th, 2022]. AUG 21st, 2023- Stable post treatment changes in the apical right upper lobe and posterior upper right ribs with no evidence of local tumor recurrence;  Stable sclerotic osseous metastases in the T3 and T4 vertebral bodies status post posterior spinal fusion.  Stable treated tiny liver lesions; No new or progressive metastatic disease in the chest, abdomen or pelvis.  Currently on Keytruda maintenance.  # proceed with [place] maintainance today. Labs today reviewed;  acceptable for treatment today.  We will plan again reimaging in November  # Bone metastases- on zometa 3 mg IVPB.  Currently q 3 M. Ca-9.3.  Pt awaiting possible dental extractions.  Patient's last last Zometa- last 12/25/2021.  Patient okay to proceed with dental extractions given approximately 4 months since the last Zometa infusion.  Continue multivitamin with calcium plus vitamin D 3 times a day. Oct 9th, dental evaluation/dentures. Contact infor given.   # Hypothyroidism- [ Mid sep 2022]  Normal T3-T4.  Currently on 50 mcg a day; SEP 2023- WNL-STABLE.   # COPD-. On 2-3 lit O2; Trelegy.Clinically- STABLE. Marland Kitchen    ZOMETA- last 5/31-q 89M;   # DISPOSITION:  # Flu shot today #  Keytruda; HOLD zometa today # follow up in 3 weeks-MD;  labs- cbc/cmp; ; Keytruda-- -Dr.B     .

## 2022-04-30 NOTE — Progress Notes (Signed)
Would like to to discuss dental issues.

## 2022-05-01 DIAGNOSIS — J441 Chronic obstructive pulmonary disease with (acute) exacerbation: Secondary | ICD-10-CM | POA: Diagnosis not present

## 2022-05-01 DIAGNOSIS — J9601 Acute respiratory failure with hypoxia: Secondary | ICD-10-CM | POA: Diagnosis not present

## 2022-05-21 ENCOUNTER — Encounter: Payer: Self-pay | Admitting: Internal Medicine

## 2022-05-21 ENCOUNTER — Inpatient Hospital Stay (HOSPITAL_BASED_OUTPATIENT_CLINIC_OR_DEPARTMENT_OTHER): Payer: Medicare HMO | Admitting: Internal Medicine

## 2022-05-21 ENCOUNTER — Inpatient Hospital Stay: Payer: Medicare HMO

## 2022-05-21 VITALS — BP 148/81 | HR 80 | Temp 95.2°F | Resp 18 | Wt 123.5 lb

## 2022-05-21 DIAGNOSIS — Z7189 Other specified counseling: Secondary | ICD-10-CM

## 2022-05-21 DIAGNOSIS — E039 Hypothyroidism, unspecified: Secondary | ICD-10-CM | POA: Diagnosis not present

## 2022-05-21 DIAGNOSIS — M549 Dorsalgia, unspecified: Secondary | ICD-10-CM | POA: Diagnosis not present

## 2022-05-21 DIAGNOSIS — Z823 Family history of stroke: Secondary | ICD-10-CM | POA: Diagnosis not present

## 2022-05-21 DIAGNOSIS — C3411 Malignant neoplasm of upper lobe, right bronchus or lung: Secondary | ICD-10-CM

## 2022-05-21 DIAGNOSIS — R0602 Shortness of breath: Secondary | ICD-10-CM | POA: Diagnosis not present

## 2022-05-21 DIAGNOSIS — J449 Chronic obstructive pulmonary disease, unspecified: Secondary | ICD-10-CM | POA: Diagnosis not present

## 2022-05-21 DIAGNOSIS — R5383 Other fatigue: Secondary | ICD-10-CM | POA: Diagnosis not present

## 2022-05-21 DIAGNOSIS — Z5112 Encounter for antineoplastic immunotherapy: Secondary | ICD-10-CM | POA: Diagnosis not present

## 2022-05-21 DIAGNOSIS — Z801 Family history of malignant neoplasm of trachea, bronchus and lung: Secondary | ICD-10-CM | POA: Diagnosis not present

## 2022-05-21 DIAGNOSIS — Z23 Encounter for immunization: Secondary | ICD-10-CM | POA: Diagnosis not present

## 2022-05-21 DIAGNOSIS — M2550 Pain in unspecified joint: Secondary | ICD-10-CM | POA: Diagnosis not present

## 2022-05-21 DIAGNOSIS — E440 Moderate protein-calorie malnutrition: Secondary | ICD-10-CM | POA: Diagnosis not present

## 2022-05-21 DIAGNOSIS — Z79899 Other long term (current) drug therapy: Secondary | ICD-10-CM | POA: Diagnosis not present

## 2022-05-21 DIAGNOSIS — Z923 Personal history of irradiation: Secondary | ICD-10-CM | POA: Diagnosis not present

## 2022-05-21 DIAGNOSIS — C7951 Secondary malignant neoplasm of bone: Secondary | ICD-10-CM | POA: Diagnosis not present

## 2022-05-21 DIAGNOSIS — C787 Secondary malignant neoplasm of liver and intrahepatic bile duct: Secondary | ICD-10-CM | POA: Diagnosis not present

## 2022-05-21 DIAGNOSIS — F17210 Nicotine dependence, cigarettes, uncomplicated: Secondary | ICD-10-CM | POA: Diagnosis not present

## 2022-05-21 DIAGNOSIS — Z8249 Family history of ischemic heart disease and other diseases of the circulatory system: Secondary | ICD-10-CM | POA: Diagnosis not present

## 2022-05-21 DIAGNOSIS — Z79890 Hormone replacement therapy (postmenopausal): Secondary | ICD-10-CM | POA: Diagnosis not present

## 2022-05-21 LAB — CBC WITH DIFFERENTIAL/PLATELET
Abs Immature Granulocytes: 0.01 10*3/uL (ref 0.00–0.07)
Basophils Absolute: 0 10*3/uL (ref 0.0–0.1)
Basophils Relative: 1 %
Eosinophils Absolute: 0.5 10*3/uL (ref 0.0–0.5)
Eosinophils Relative: 7 %
HCT: 43.1 % (ref 39.0–52.0)
Hemoglobin: 13.2 g/dL (ref 13.0–17.0)
Immature Granulocytes: 0 %
Lymphocytes Relative: 15 %
Lymphs Abs: 1 10*3/uL (ref 0.7–4.0)
MCH: 30.6 pg (ref 26.0–34.0)
MCHC: 30.6 g/dL (ref 30.0–36.0)
MCV: 100 fL (ref 80.0–100.0)
Monocytes Absolute: 0.6 10*3/uL (ref 0.1–1.0)
Monocytes Relative: 9 %
Neutro Abs: 4.5 10*3/uL (ref 1.7–7.7)
Neutrophils Relative %: 68 %
Platelets: 159 10*3/uL (ref 150–400)
RBC: 4.31 MIL/uL (ref 4.22–5.81)
RDW: 12.2 % (ref 11.5–15.5)
WBC: 6.5 10*3/uL (ref 4.0–10.5)
nRBC: 0 % (ref 0.0–0.2)

## 2022-05-21 LAB — COMPREHENSIVE METABOLIC PANEL
ALT: 13 U/L (ref 0–44)
AST: 20 U/L (ref 15–41)
Albumin: 4.1 g/dL (ref 3.5–5.0)
Alkaline Phosphatase: 52 U/L (ref 38–126)
Anion gap: 7 (ref 5–15)
BUN: 11 mg/dL (ref 8–23)
CO2: 41 mmol/L — ABNORMAL HIGH (ref 22–32)
Calcium: 9 mg/dL (ref 8.9–10.3)
Chloride: 91 mmol/L — ABNORMAL LOW (ref 98–111)
Creatinine, Ser: 0.61 mg/dL (ref 0.61–1.24)
GFR, Estimated: >60 mL/min (ref >60)
Glucose, Bld: 103 mg/dL — ABNORMAL HIGH (ref 70–99)
Potassium: 4.3 mmol/L (ref 3.5–5.1)
Sodium: 139 mmol/L (ref 135–145)
Total Bilirubin: 0.4 mg/dL (ref 0.3–1.2)
Total Protein: 7.5 g/dL (ref 6.5–8.1)

## 2022-05-21 MED ORDER — HEPARIN SOD (PORK) LOCK FLUSH 100 UNIT/ML IV SOLN
500.0000 [IU] | Freq: Once | INTRAVENOUS | Status: DC | PRN
  Filled 2022-05-21: qty 5

## 2022-05-21 MED ORDER — SODIUM CHLORIDE 0.9 % IV SOLN
200.0000 mg | Freq: Once | INTRAVENOUS | Status: AC
  Administered 2022-05-21: 200 mg via INTRAVENOUS
  Filled 2022-05-21: qty 8

## 2022-05-21 MED ORDER — SODIUM CHLORIDE 0.9 % IV SOLN
Freq: Once | INTRAVENOUS | Status: AC
  Filled 2022-05-21: qty 250

## 2022-05-21 NOTE — Progress Notes (Signed)
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   # 2021- MAY 26th-Keytrda x2 cycles; [borderline PS] July 7th-carbo Alimta Keytruda cycle #1  #November 2022-oligometastatic progression-right upper lobe subpleural involvement with; s/p radiation finished December 15.  Temporarily held [place].  #Jan fourth 2023-restarted Keytruda.  # NGS/MOLECULAR TESTS:   # PALLIATIVE CARE EVALUATION:  # PAIN MANAGEMENT:    DIAGNOSIS:   STAGE:         ;  GOALS:  CURRENT/MOST RECENT THERAPY :     Cancer of upper lobe of right lung ([place])  11/23/2019 Initial Diagnosis   Cancer of upper lobe of right lung ([place])   12/16/2019 -  Chemotherapy   Patient is on Treatment Plan : LUNG Carboplatin (5) + Pemetrexed (500) + Pembrolizumab (200) D1 q21d Induction x 4 cycles / Maintenance Pemetrexed (500) + Pembrolizumab (200) D1 q21d     12/21/2019 - 02/26/2022 Chemotherapy   Patient is on Treatment Plan : LUNG CARBOplatin / Pemetrexed / Pembrolizumab q21d Induction x 4 cycles / Maintenance Pemetrexed + Pembrolizumab      HISTORY OF PRESENTING ILLNESS: Thin built frail appearing male patient.  No acute distress.  Alone.   Nasal cannula oxygen.  Ambulating independently.    Cody [name] 66 y.o.  male patient  with metastatic non-small cell lung cancer; cord compression status post decompressive surgery; currently on Keytruda maintenance-is here for follow-up.   Patient denies new problems/concerns today.    Currently awaiting appointment with oral surgeon regarding dental extraction.  Denies any new complaints. No diarrhea.  No nausea no vomiting no rash.  No worsening shortness of breath.  Review of Systems  Constitutional:  Positive for malaise/fatigue. Negative for chills, diaphoresis and fever.  HENT:  Negative for nosebleeds and sore throat.   Eyes:  Negative for double vision.  Respiratory:  Positive for shortness of breath. Negative for cough, hemoptysis, sputum production and wheezing.   Cardiovascular:  Negative for chest pain, palpitations, orthopnea and leg swelling.  Gastrointestinal:  Negative for abdominal pain, blood in stool, constipation, heartburn, melena, nausea and vomiting.  Genitourinary:  Negative for dysuria, frequency and urgency.  Musculoskeletal:  Positive for back pain and joint pain.  Skin: Negative.  Negative for itching and rash.  Neurological:  Negative for dizziness, tingling, focal weakness, weakness and headaches.  Endo/Heme/Allergies:  Does not bruise/bleed easily.  Psychiatric/Behavioral:  Negative for depression. The patient is not nervous/anxious and does not have insomnia.      MEDICAL HISTORY:  Past Medical History:  Diagnosis Date   Allergy    Cancer of upper lobe of right lung ([place]) 10/2019   COPD (chronic obstructive pulmonary disease) (HCC)    Prostate enlargement     SURGICAL HISTORY: Past Surgical History:  Procedure Laterality Date   HERNIA REPAIR Bilateral 1982   HERNIA REPAIR Right 1994   IR IMAGING GUIDED PORT INSERTION  02/24/2020   LAMINECTOMY FOR EXCISION / EVACUATION INTRASPINAL LESION  11/07/2019   T1-T 6 at [facility]: Social History   Socioeconomic History   Marital status: Married    Spouse name: [name]     Number of children: 4   Years of education: Not on file   Highest education level: Some college, no degree  Occupational History   Occupation: dispatch and delivery   Tobacco Use   Smoking status: Former    Packs/day: 2.00    Years: 30.00    Total pack years: 60.00    Types: Cigarettes    Quit date: 07/29/2011    Years since quitting: 10.8   Smokeless tobacco: Never   Tobacco comments:    pt vapes  Vaping Use   Vaping Use: Not on file  Substance and Sexual Activity   Alcohol use: Yes    Alcohol/week: 5.0 standard drinks of alcohol    Types: 5 Standard drinks or equivalent per week    Comment: 1-2/day   Drug use: No   Sexual activity: Not Currently    Partners: Female  Other Topics Concern   Not on file  Social History Narrative   Worked in warehouse/ quit smoking in 2015; beer 1/day; in [place]; with wife.    Social Determinants of Health   Financial Resource Strain: Low Risk  (10/21/2019)   Overall Financial Resource Strain (CARDIA)    Difficulty of Paying Living Expenses: Not hard at all  Food Insecurity: No Food Insecurity (10/21/2019)   Hunger Vital Sign    Worried About Running Out of Food in the Last Year: Never true    Ran Out of Food in the Last Year: Never true  Transportation Needs: No Transportation Needs (10/21/2019)   PRAPARE - Hydrologist (Medical): No    Lack of Transportation (Non-Medical): No  Physical Activity: Insufficiently Active (11/25/2019)   Exercise Vital Sign    Days of Exercise per Week: 7 days    Minutes of Exercise per Session: 20 min  Stress: No Stress Concern Present (10/21/2019)   [place]    Feeling of Stress : Not at all  Social Connections: [place] (10/21/2019)   Social Connection and Isolation Panel [NHANES]    Frequency of Communication with Friends and Family: More than three times a week    Frequency of Social Gatherings  with Friends and Family: More than three times a week    Attends Religious Services: More than 4 times per year    Active Member of [employer] or Organizations: Yes    Attends Music therapist: More than 4 times per year    Marital Status: Married  Human resources officer Violence: Not At Risk (10/21/2019)   Humiliation, Afraid, Rape, and Kick questionnaire    Fear of Current or Ex-Partner: No    Emotionally Abused: No    Physically Abused: No    Sexually Abused: No    FAMILY HISTORY: Family History  Problem Relation Age of Onset   Heart disease Father    CVA Mother    Lung cancer Sister    Lung cancer Brother     ALLERGIES:  has No Known Allergies.  MEDICATIONS:  Current Outpatient Medications  Medication Sig Dispense Refill   acetaminophen (TYLENOL) 500 MG tablet Take 500-1,000 mg by mouth every 6 (six) hours as needed for mild pain or fever.  albuterol (VENTOLIN HFA) 108 (90 Base) MCG/ACT inhaler Inhale 2 puffs into the lungs every 4 (four) hours as needed for wheezing or shortness of breath. 18 g 0   Calcium Carb-Cholecalciferol (OYSTER SHELL CALCIUM) 500-400 MG-UNIT TABS Take 500 tablets by mouth.     escitalopram (LEXAPRO) 5 MG tablet TAKE ONE TABLET BY MOUTH EVERY MORNING 90 tablet 0   Fluticasone-Umeclidin-Vilant (TRELEGY ELLIPTA) 100-62.5-25 MCG/INH AEPB Inhale 1 puff into the lungs daily. 180 each 1   levothyroxine (SYNTHROID) 50 MCG tablet TAKE ONE TABLET BY MOUTH DAILY BEFORE BREAKFAST 90 tablet 2   lidocaine-prilocaine (EMLA) cream Apply 1 application topically as needed. Apply small amount to port site at least 1 hour prior to it being accessed, cover with plastic wrap 30 g 1   Multiple Vitamin (MULTIVITAMIN) tablet Take 1 tablet by mouth daily.     QUEtiapine (SEROQUEL) 25 MG tablet Take 1 tablet (25 mg total) by mouth at bedtime. 90 tablet 1   No current facility-administered medications for this visit.      Marland Kitchen  PHYSICAL EXAMINATION: ECOG PERFORMANCE  STATUS: 3 - Symptomatic, >50% confined to bed  Vitals:   05/21/22 1300  BP: (!) 148/81  Pulse: 80  Resp: 18  Temp: (!) 95.2 F (35.1 C)  SpO2: 95%   Filed Weights   05/21/22 1300  Weight: 123 lb 8 oz (56 kg)    Physical Exam HENT:     Head: Normocephalic and atraumatic.     Mouth/Throat:     Pharynx: No oropharyngeal exudate.  Eyes:     Pupils: Pupils are equal, round, and reactive to light.  Cardiovascular:     Rate and Rhythm: Normal rate and regular rhythm.  Pulmonary:     Effort: No respiratory distress.     Breath sounds: No wheezing.     Comments: Decreased air entry bilaterally. Abdominal:     General: Bowel sounds are normal. There is no distension.     Palpations: Abdomen is soft. There is no mass.     Tenderness: There is no abdominal tenderness. There is no guarding or rebound.  Musculoskeletal:        General: No tenderness. Normal range of motion.     Cervical back: Normal range of motion and neck supple.  Skin:    General: Skin is warm.  Neurological:     Mental Status: He is alert and oriented to person, place, and time.  Psychiatric:        Mood and Affect: Affect normal.      LABORATORY DATA:  I have reviewed the data as listed Lab Results  Component Value Date   WBC 6.5 05/21/2022   HGB 13.2 05/21/2022   HCT 43.1 05/21/2022   MCV 100.0 05/21/2022   PLT 159 05/21/2022   Recent Labs    04/09/22 1239 04/30/22 1422 05/21/22 1339  NA 138 138 139  K 4.1 4.3 4.3  CL 91* 88* 91*  CO2 41* 42* 41*  GLUCOSE 99 96 103*  BUN 11 7* 11  CREATININE 0.45* 0.58* 0.61  CALCIUM 8.6* 9.3 9.0  GFRNONAA >60 >60 >60  PROT 7.3 7.6 7.5  ALBUMIN 3.9 4.1 4.1  AST _0 ALT _1 ALKPHOS 53 56 52  BILITOT 0.6 0.6 0.4    RADIOGRAPHIC STUDIES: I have personally reviewed the radiological images as listed and agreed with the findings in the report. No results found.   ASSESSMENT & PLAN:   Cancer of  upper lobe of right lung ([place]) # Stage  IV metastatic non-small cell lung cancer favor adeno; mets to bone; liver.    oligometastatic progression-s/p RT right upper lobe mass [until dec 15th, 2022]. AUG 21st, 2023- Stable post treatment changes in the apical right upper lobe and posterior upper right ribs with no evidence of local tumor recurrence;  Stable sclerotic osseous metastases in the T3 and T4 vertebral bodies status post posterior spinal fusion.  Stable treated tiny liver lesions; No new or progressive metastatic disease in the chest, abdomen or pelvis.  Currently on Keytruda maintenance.  # proceed with [place] maintainance today. Labs today reviewed;  acceptable for treatment today.  We will plan again reimaging in November  # Bone metastases- on zometa 3 mg IVPB.  Currently q 3 M. Ca-9.3.  Pt awaiting possible dental extractions.  Patient's last last Zometa- last 12/25/2021.  Patient okay to proceed with dental extractions given approximately 4 months since the last Zometa infusion.  Continue multivitamin with calcium plus vitamin D 3 times a day.   # Hypothyroidism- [ Mid sep 2022]  Normal T3-T4.  Currently on 50 mcg a day; SEP 2023- WNL-STABLE.   # COPD-. On 2-3 lit O2; Trelegy.Clinically- STABLE. Marland Kitchen    HOLD ZOMETA- last 5/31-q 12M;   # DISPOSITION:  # Keytruda;  # follow up in 3 weeks-MD;  labs- cbc/cmp; Keytruda-- -Dr.B     .       All questions were answered. The patient knows to call the clinic with any problems, questions or concerns.    Cammie Sickle, MD 05/21/2022 11:17 PM

## 2022-05-21 NOTE — Progress Notes (Signed)
Patient denies new problems/concerns today.   °

## 2022-05-21 NOTE — Assessment & Plan Note (Addendum)
#   Stage IV metastatic non-small cell lung cancer favor adeno; mets to bone; liver.    oligometastatic progression-s/p RT right upper lobe mass [until dec 15th, 2022]. AUG 21st, 2023- Stable post treatment changes in the apical right upper lobe and posterior upper right ribs with no evidence of local tumor recurrence;  Stable sclerotic osseous metastases in the T3 and T4 vertebral bodies status post posterior spinal fusion.  Stable treated tiny liver lesions; No new or progressive metastatic disease in the chest, abdomen or pelvis.  Currently on Keytruda maintenance.  # proceed with [place] maintainance today. Labs today reviewed;  acceptable for treatment today.  We will plan again reimaging in November/december.  Will order at next visit.  # Bone metastases- on zometa 3 mg IVPB.  Currently q 3 M. Ca-9.3.  Pt awaiting possible dental extractions.  Patient's last last Zometa- last 12/25/2021.  Patient okay to proceed with dental extractions given approximately 4 months since the last Zometa infusion.  Continue multivitamin with calcium plus vitamin D 3 times a day.   # Hypothyroidism- [ Mid sep 2022]  Normal T3-T4.  Currently on 50 mcg a day; SEP 2023- WNL-STABLE.   # COPD-. On 2-3 lit O2; Trelegy.Clinically- STABLE. Marland Kitchen    HOLD ZOMETA- last 5/31-q 20M;   # DISPOSITION:  # Keytruda;  # follow up in 3 weeks-MD;  labs- cbc/cmp; Keytruda-- -Dr.B     .

## 2022-06-01 DIAGNOSIS — J9601 Acute respiratory failure with hypoxia: Secondary | ICD-10-CM | POA: Diagnosis not present

## 2022-06-01 DIAGNOSIS — J441 Chronic obstructive pulmonary disease with (acute) exacerbation: Secondary | ICD-10-CM | POA: Diagnosis not present

## 2022-06-11 ENCOUNTER — Ambulatory Visit: Payer: Medicare HMO | Admitting: Oncology

## 2022-06-11 ENCOUNTER — Other Ambulatory Visit: Payer: Medicare HMO

## 2022-06-11 ENCOUNTER — Inpatient Hospital Stay (HOSPITAL_BASED_OUTPATIENT_CLINIC_OR_DEPARTMENT_OTHER): Payer: Medicare HMO | Admitting: Medical Oncology

## 2022-06-11 ENCOUNTER — Ambulatory Visit: Payer: Medicare HMO | Admitting: Internal Medicine

## 2022-06-11 ENCOUNTER — Inpatient Hospital Stay: Payer: Medicare HMO | Attending: Internal Medicine

## 2022-06-11 ENCOUNTER — Other Ambulatory Visit: Payer: Self-pay | Admitting: Oncology

## 2022-06-11 ENCOUNTER — Ambulatory Visit: Payer: Medicare HMO

## 2022-06-11 ENCOUNTER — Encounter: Payer: Self-pay | Admitting: Medical Oncology

## 2022-06-11 ENCOUNTER — Inpatient Hospital Stay: Payer: Medicare HMO

## 2022-06-11 VITALS — BP 144/89 | HR 87 | Temp 96.2°F | Wt 123.4 lb

## 2022-06-11 DIAGNOSIS — Z23 Encounter for immunization: Secondary | ICD-10-CM

## 2022-06-11 DIAGNOSIS — C3411 Malignant neoplasm of upper lobe, right bronchus or lung: Secondary | ICD-10-CM | POA: Diagnosis not present

## 2022-06-11 DIAGNOSIS — C7951 Secondary malignant neoplasm of bone: Secondary | ICD-10-CM | POA: Diagnosis not present

## 2022-06-11 DIAGNOSIS — R54 Age-related physical debility: Secondary | ICD-10-CM | POA: Diagnosis not present

## 2022-06-11 DIAGNOSIS — Z79899 Other long term (current) drug therapy: Secondary | ICD-10-CM | POA: Insufficient documentation

## 2022-06-11 DIAGNOSIS — Z8249 Family history of ischemic heart disease and other diseases of the circulatory system: Secondary | ICD-10-CM | POA: Diagnosis not present

## 2022-06-11 DIAGNOSIS — Z823 Family history of stroke: Secondary | ICD-10-CM | POA: Insufficient documentation

## 2022-06-11 DIAGNOSIS — R7989 Other specified abnormal findings of blood chemistry: Secondary | ICD-10-CM

## 2022-06-11 DIAGNOSIS — R0602 Shortness of breath: Secondary | ICD-10-CM | POA: Diagnosis not present

## 2022-06-11 DIAGNOSIS — Z7189 Other specified counseling: Secondary | ICD-10-CM

## 2022-06-11 DIAGNOSIS — Z5112 Encounter for antineoplastic immunotherapy: Secondary | ICD-10-CM | POA: Insufficient documentation

## 2022-06-11 DIAGNOSIS — Z923 Personal history of irradiation: Secondary | ICD-10-CM | POA: Diagnosis not present

## 2022-06-11 DIAGNOSIS — Z801 Family history of malignant neoplasm of trachea, bronchus and lung: Secondary | ICD-10-CM | POA: Insufficient documentation

## 2022-06-11 DIAGNOSIS — C787 Secondary malignant neoplasm of liver and intrahepatic bile duct: Secondary | ICD-10-CM | POA: Diagnosis not present

## 2022-06-11 LAB — CBC WITH DIFFERENTIAL/PLATELET
Abs Immature Granulocytes: 0.01 10*3/uL (ref 0.00–0.07)
Basophils Absolute: 0.1 10*3/uL (ref 0.0–0.1)
Basophils Relative: 1 %
Eosinophils Absolute: 0.5 10*3/uL (ref 0.0–0.5)
Eosinophils Relative: 8 %
HCT: 43.5 % (ref 39.0–52.0)
Hemoglobin: 13.6 g/dL (ref 13.0–17.0)
Immature Granulocytes: 0 %
Lymphocytes Relative: 20 %
Lymphs Abs: 1.1 10*3/uL (ref 0.7–4.0)
MCH: 30.6 pg (ref 26.0–34.0)
MCHC: 31.3 g/dL (ref 30.0–36.0)
MCV: 98 fL (ref 80.0–100.0)
Monocytes Absolute: 0.6 10*3/uL (ref 0.1–1.0)
Monocytes Relative: 11 %
Neutro Abs: 3.5 10*3/uL (ref 1.7–7.7)
Neutrophils Relative %: 60 %
Platelets: 173 10*3/uL (ref 150–400)
RBC: 4.44 MIL/uL (ref 4.22–5.81)
RDW: 12.2 % (ref 11.5–15.5)
WBC: 5.8 10*3/uL (ref 4.0–10.5)
nRBC: 0 % (ref 0.0–0.2)

## 2022-06-11 LAB — COMPREHENSIVE METABOLIC PANEL
ALT: 15 U/L (ref 0–44)
AST: 22 U/L (ref 15–41)
Albumin: 4.1 g/dL (ref 3.5–5.0)
Alkaline Phosphatase: 52 U/L (ref 38–126)
Anion gap: 7 (ref 5–15)
BUN: 11 mg/dL (ref 8–23)
CO2: 40 mmol/L — ABNORMAL HIGH (ref 22–32)
Calcium: 8.7 mg/dL — ABNORMAL LOW (ref 8.9–10.3)
Chloride: 90 mmol/L — ABNORMAL LOW (ref 98–111)
Creatinine, Ser: 0.5 mg/dL — ABNORMAL LOW (ref 0.61–1.24)
GFR, Estimated: >60 mL/min (ref >60)
Glucose, Bld: 96 mg/dL (ref 70–99)
Potassium: 4.2 mmol/L (ref 3.5–5.1)
Sodium: 137 mmol/L (ref 135–145)
Total Bilirubin: 0.3 mg/dL (ref 0.3–1.2)
Total Protein: 7.6 g/dL (ref 6.5–8.1)

## 2022-06-11 MED ORDER — SODIUM CHLORIDE 0.9 % IV SOLN
200.0000 mg | Freq: Once | INTRAVENOUS | Status: AC
  Administered 2022-06-11: 200 mg via INTRAVENOUS
  Filled 2022-06-11: qty 8

## 2022-06-11 MED ORDER — HEPARIN SOD (PORK) LOCK FLUSH 100 UNIT/ML IV SOLN
500.0000 [IU] | Freq: Once | INTRAVENOUS | Status: AC
  Administered 2022-06-11: 500 [IU] via INTRAVENOUS
  Filled 2022-06-11: qty 5

## 2022-06-11 MED ORDER — SODIUM CHLORIDE 0.9 % IV SOLN
Freq: Once | INTRAVENOUS | Status: AC
  Filled 2022-06-11: qty 250

## 2022-06-11 NOTE — Patient Instructions (Signed)
[facility]  Discharge Instructions: Thank you for choosing [facility] to provide your oncology and hematology care.  If you have a lab appointment with the Cancer Center, please go directly to the Cancer Center and check in at the registration area.  Wear comfortable clothing and clothing appropriate for easy access to any Portacath or PICC line.   We strive to give you quality time with your provider. You may need to reschedule your appointment if you arrive late (15 or more minutes).  Arriving late affects you and other patients whose appointments are after yours.  Also, if you miss three or more appointments without notifying the office, you may be dismissed from the clinic at the provider's discretion.      For prescription refill requests, have your pharmacy contact our office and allow 72 hours for refills to be completed.    Today you received the following chemotherapy and/or immunotherapy agents Keytruda      To help prevent nausea and vomiting after your treatment, we encourage you to take your nausea medication as directed.  BELOW ARE SYMPTOMS THAT SHOULD BE REPORTED IMMEDIATELY: *FEVER GREATER THAN 100.4 F (38 C) OR HIGHER *CHILLS OR SWEATING *NAUSEA AND VOMITING THAT IS NOT CONTROLLED WITH YOUR NAUSEA MEDICATION *UNUSUAL SHORTNESS OF BREATH *UNUSUAL BRUISING OR BLEEDING *URINARY PROBLEMS (pain or burning when urinating, or frequent urination) *BOWEL PROBLEMS (unusual diarrhea, constipation, pain near the anus) TENDERNESS IN MOUTH AND THROAT WITH OR WITHOUT PRESENCE OF ULCERS (sore throat, sores in mouth, or a toothache) UNUSUAL RASH, SWELLING OR PAIN  UNUSUAL VAGINAL DISCHARGE OR ITCHING   Items with * indicate a potential emergency and should be followed up as soon as possible or go to the Emergency Department if any problems should occur.  Please show the CHEMOTHERAPY ALERT CARD or IMMUNOTHERAPY ALERT CARD at check-in to  the Emergency Department and triage nurse.  Should you have questions after your visit or need to cancel or reschedule your appointment, please contact [facility]  [phone] and follow the prompts.  Office hours are 8:00 a.m. to 4:30 p.m. Monday - Friday. Please note that voicemails left after 4:00 p.m. may not be returned until the following business day.  We are closed weekends and major holidays. You have access to a nurse at all times for urgent questions. Please call the main number to the clinic [phone] and follow the prompts.  For any non-urgent questions, you may also contact your provider using MyChart. We now offer e-Visits for anyone 18 and older to request care online for non-urgent symptoms. For details visit [email or web address].   Also download the MyChart app! Go to the app store, search "MyChart", open the app, select [facility], and log in with your MyChart username and password.  Masks are optional in the cancer centers. If you would like for your care team to wear a mask while they are taking care of you, please let them know. For doctor visits, patients may have with them one support person who is at least 68 years old. At this time, visitors are not allowed in the infusion area.   

## 2022-06-11 NOTE — Progress Notes (Signed)
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   # 2021- MAY 26th-Keytrda x2 cycles; [borderline PS] July 7th-carbo Alimta Keytruda cycle #1  #November 2022-oligometastatic progression-right upper lobe subpleural involvement with; s/p radiation finished December 15.  Temporarily held [place].  #Jan fourth 2023-restarted Keytruda.  # NGS/MOLECULAR TESTS:   # PALLIATIVE CARE EVALUATION:  # PAIN MANAGEMENT:    DIAGNOSIS:   STAGE:         ;  GOALS:  CURRENT/MOST RECENT THERAPY :     Cancer of upper lobe of right lung ([place])  11/23/2019 Initial Diagnosis   Cancer of upper lobe of right lung ([place])   12/16/2019 -  Chemotherapy   Patient is on Treatment Plan : LUNG Carboplatin (5) + Pemetrexed (500) + Pembrolizumab (200) D1 q21d Induction x 4 cycles / Maintenance Pemetrexed (500) + Pembrolizumab (200) D1 q21d     12/21/2019 - 02/26/2022 Chemotherapy   Patient is on Treatment Plan : LUNG CARBOplatin / Pemetrexed / Pembrolizumab q21d Induction x 4 cycles / Maintenance Pemetrexed + Pembrolizumab      HISTORY OF PRESENTING ILLNESS: Thin built frail appearing male patient.  No acute distress.  Alone.   Nasal cannula oxygen.  Ambulating independently.    Cody [name] 66 y.o.  male patient  with metastatic non-small cell lung cancer; cord compression status post decompressive surgery; currently on Keytruda maintenance-is here for follow-up.   Patient denies new problems/concerns today.  He reports that he is doing well. He is tolerating his [place] without any side effects. He continues to be on 4L of oxygen. Eating and drinking well. His Zometa has been on hold as he begins an extensive amount of dental extractions. He has had two removed so far and is set to have all of his upper teeth and 4 of his lower teeth extracted on Dec 1st.   Denies any new complaints. No diarrhea.  No nausea no vomiting no rash.  No worsening shortness of breath.No Hemoptysis.   Review of Systems  Constitutional:  Negative for chills, diaphoresis, fever and malaise/fatigue.  HENT:  Negative for nosebleeds and sore throat.   Eyes:  Negative for double vision.  Respiratory:  Positive for shortness of breath. Negative for cough, hemoptysis, sputum production and wheezing.   Cardiovascular:  Negative for chest pain, palpitations, orthopnea and leg swelling.  Gastrointestinal:  Negative for abdominal pain, blood in stool, constipation, heartburn, melena, nausea and vomiting.  Genitourinary:  Negative for dysuria, frequency and urgency.  Musculoskeletal:  Negative for back pain and joint pain.  Skin: Negative.  Negative for itching and rash.  Neurological:  Negative for dizziness, tingling, focal weakness, weakness and headaches.  Endo/Heme/Allergies:  Does not bruise/bleed easily.  Psychiatric/Behavioral:  Negative for depression. The patient is not nervous/anxious and does not have insomnia.      MEDICAL HISTORY:  Past Medical History:  Diagnosis Date  Allergy    Cancer of upper lobe of right lung ([place]) 10/2019   COPD (chronic obstructive pulmonary disease) (HCC)    Prostate enlargement     SURGICAL HISTORY: Past Surgical History:  Procedure Laterality Date   HERNIA REPAIR Bilateral 1982    HERNIA REPAIR Right 1994   IR IMAGING GUIDED PORT INSERTION  02/24/2020   LAMINECTOMY FOR EXCISION / EVACUATION INTRASPINAL LESION  11/07/2019   T1-T 6 at [facility]     SOCIAL HISTORY: Social History   Socioeconomic History   Marital status: Married    Spouse name: [name]    Number of children: 4   Years of education: Not on file   Highest education level: Some college, no degree  Occupational History   Occupation: dispatch and delivery   Tobacco Use   Smoking status: Former    Packs/day: 2.00    Years: 30.00    Total pack years: 60.00    Types: Cigarettes    Quit date: 07/29/2011    Years since quitting: 10.8   Smokeless tobacco: Never   Tobacco comments:    pt vapes  Vaping Use   Vaping Use: Not on file  Substance and Sexual Activity   Alcohol use: Yes    Alcohol/week: 5.0 standard drinks of alcohol    Types: 5 Standard drinks or equivalent per week    Comment: 1-2/day   Drug use: No   Sexual activity: Not Currently    Partners: Female  Other Topics Concern   Not on file  Social History Narrative   Worked in warehouse/ quit smoking in 2015; beer 1/day; in [place]; with wife.    Social Determinants of Health   Financial Resource Strain: Low Risk  (10/21/2019)   Overall Financial Resource Strain (CARDIA)    Difficulty of Paying Living Expenses: Not hard at all  Food Insecurity: No Food Insecurity (10/21/2019)   Hunger Vital Sign    Worried About Running Out of Food in the Last Year: Never true    Ran Out of Food in the Last Year: Never true  Transportation Needs: No Transportation Needs (10/21/2019)   PRAPARE - Hydrologist (Medical): No    Lack of Transportation (Non-Medical): No  Physical Activity: Insufficiently Active (11/25/2019)   Exercise Vital Sign    Days of Exercise per Week: 7 days    Minutes of Exercise per Session: 20 min  Stress: No Stress Concern Present (10/21/2019)   [place]    Feeling of Stress : Not at all  Social Connections: [place] (10/21/2019)   Social Connection and Isolation Panel [NHANES]    Frequency of Communication with Friends and Family: More than three times a week    Frequency of Social Gatherings with Friends and Family: More than three times a week    Attends Religious Services: More than 4 times per year    Active Member of [employer] or Organizations: Yes    Attends Music therapist: More than 4 times per year    Marital Status: Married  Human resources officer Violence: Not At Risk (10/21/2019)   Humiliation, Afraid, Rape, and Kick questionnaire    Fear of Current or Ex-Partner: No    Emotionally Abused: No    Physically Abused: No    Sexually Abused: No    FAMILY HISTORY: Family History  Problem Relation Age of Onset   Heart disease Father    CVA Mother  Lung cancer Sister    Lung cancer Brother     ALLERGIES:  has No Known Allergies.  MEDICATIONS:  Current Outpatient Medications  Medication Sig Dispense Refill   acetaminophen (TYLENOL) 500 MG tablet Take 500-1,000 mg by mouth every 6 (six) hours as needed for mild pain or fever.      albuterol (VENTOLIN HFA) 108 (90 Base) MCG/ACT inhaler Inhale 2 puffs into the lungs every 4 (four) hours as needed for wheezing or shortness of breath. 18 g 0   Calcium Carb-Cholecalciferol (OYSTER SHELL CALCIUM) 500-400 MG-UNIT TABS Take 500 tablets by mouth.     escitalopram (LEXAPRO) 5 MG tablet TAKE ONE TABLET BY MOUTH EVERY MORNING 90 tablet 0   levothyroxine (SYNTHROID) 50 MCG tablet TAKE ONE TABLET BY MOUTH DAILY BEFORE BREAKFAST 90 tablet 2   lidocaine-prilocaine (EMLA) cream Apply 1 application topically as needed. Apply small amount to port site at least 1 hour prior to it being accessed, cover with plastic wrap 30 g 1   Multiple Vitamin (MULTIVITAMIN) tablet Take 1 tablet by mouth daily.     QUEtiapine (SEROQUEL) 25 MG tablet Take 1 tablet (25 mg  total) by mouth at bedtime. 90 tablet 1   Fluticasone-Umeclidin-Vilant (TRELEGY ELLIPTA) 100-62.5-25 MCG/INH AEPB Inhale 1 puff into the lungs daily. 180 each 1   No current facility-administered medications for this visit.      Marland Kitchen  PHYSICAL EXAMINATION: ECOG PERFORMANCE STATUS: 3 - Symptomatic, >50% confined to bed  Vitals:   06/11/22 1345  BP: (!) 144/89  Pulse: 87  Temp: (!) 96.2 F (35.7 C)  SpO2: 96%   Filed Weights   06/11/22 1345  Weight: 123 lb 6.4 oz (56 kg)    Physical Exam HENT:     Head: Normocephalic and atraumatic.     Mouth/Throat:     Pharynx: No oropharyngeal exudate.  Eyes:     Pupils: Pupils are equal, round, and reactive to light.  Cardiovascular:     Rate and Rhythm: Normal rate and regular rhythm.  Pulmonary:     Effort: No respiratory distress.     Breath sounds: No wheezing.     Comments: Decreased air entry bilaterally. Abdominal:     General: Bowel sounds are normal. There is no distension.     Palpations: Abdomen is soft. There is no mass.     Tenderness: There is no abdominal tenderness. There is no guarding or rebound.  Musculoskeletal:        General: No tenderness. Normal range of motion.     Cervical back: Normal range of motion and neck supple.  Skin:    General: Skin is warm.  Neurological:     Mental Status: He is alert and oriented to person, place, and time.  Psychiatric:        Mood and Affect: Affect normal.      LABORATORY DATA:  I have reviewed the data as listed Lab Results  Component Value Date   WBC 5.8 06/11/2022   HGB 13.6 06/11/2022   HCT 43.5 06/11/2022   MCV 98.0 06/11/2022   PLT 173 06/11/2022   Recent Labs    04/09/22 1239 04/30/22 1422 05/21/22 1339  NA 138 138 139  K 4.1 4.3 4.3  CL 91* 88* 91*  CO2 41* 42* 41*  GLUCOSE 99 96 103*  BUN 11 7* 11  CREATININE 0.45* 0.58* 0.61  CALCIUM 8.6* 9.3 9.0  GFRNONAA >60 >60 >60  PROT 7.3 7.6 7.5  ALBUMIN 3.9 4.1  4.1  AST _0 ALT _1 ALKPHOS 53 56 52  BILITOT 0.6 0.6 0.4     RADIOGRAPHIC STUDIES: I have personally reviewed the radiological images as listed and agreed with the findings in the report. No results found.   ASSESSMENT & PLAN:   Cancer of upper lobe of right lung ([place]) # Stage IV metastatic non-small cell lung cancer favor adeno; mets to bone; liver.    oligometastatic progression-s/p RT right upper lobe mass [until dec 15th, 2022]. AUG 21st, 2023- Stable post treatment changes in the apical right upper lobe and posterior upper right ribs with no evidence of local tumor recurrence;  Stable sclerotic osseous metastases in the T3 and T4 vertebral bodies status post posterior spinal fusion.  Stable treated tiny liver lesions; No new or progressive metastatic disease in the chest, abdomen or pelvis.  Currently on Keytruda maintenance.   Labs today reviewed;  acceptable for Keytruda treatment today. Re imaging to be ordered today to be completed ideally before his next treatment.    # Bone metastases- on zometa 3 mg IVPB.  ON HOLD due to upcoming dental extractions. He will continue calcium and vitamin D which he is taking daily.    # Hypothyroidism- [ Mid sep 2022]  Normal T3-T4.  Currently on 50 mcg a day. TSH at next visit.    # COPD-. On 4 lit O2 which he reports that eh has been on for over a year. Also on  Trelegy. Clinically- STABLE.   # DISPOSITION:  # Keytruda Today CT Chest Abd Pelvis w/ Contrast before next follow up ideally  # follow up in 3 weeks-MD;  labs- cbc/cmp, TSH; Keytruda-[place]    All questions were answered. The patient knows to call the clinic with any problems, questions or concerns.    Hughie Closs, PA-C 06/11/2022 2:01 PM

## 2022-07-01 DIAGNOSIS — J441 Chronic obstructive pulmonary disease with (acute) exacerbation: Secondary | ICD-10-CM | POA: Diagnosis not present

## 2022-07-01 DIAGNOSIS — J9601 Acute respiratory failure with hypoxia: Secondary | ICD-10-CM | POA: Diagnosis not present

## 2022-07-02 ENCOUNTER — Encounter: Payer: Self-pay | Admitting: Internal Medicine

## 2022-07-02 ENCOUNTER — Inpatient Hospital Stay: Payer: Medicare HMO

## 2022-07-02 ENCOUNTER — Inpatient Hospital Stay: Payer: Medicare HMO | Attending: Internal Medicine | Admitting: Internal Medicine

## 2022-07-02 VITALS — BP 145/80 | HR 80 | Temp 96.1°F | Resp 18

## 2022-07-02 DIAGNOSIS — Z7189 Other specified counseling: Secondary | ICD-10-CM

## 2022-07-02 DIAGNOSIS — Z823 Family history of stroke: Secondary | ICD-10-CM | POA: Insufficient documentation

## 2022-07-02 DIAGNOSIS — Z801 Family history of malignant neoplasm of trachea, bronchus and lung: Secondary | ICD-10-CM | POA: Insufficient documentation

## 2022-07-02 DIAGNOSIS — C3411 Malignant neoplasm of upper lobe, right bronchus or lung: Secondary | ICD-10-CM | POA: Insufficient documentation

## 2022-07-02 DIAGNOSIS — R54 Age-related physical debility: Secondary | ICD-10-CM | POA: Diagnosis not present

## 2022-07-02 DIAGNOSIS — Z5112 Encounter for antineoplastic immunotherapy: Secondary | ICD-10-CM | POA: Insufficient documentation

## 2022-07-02 DIAGNOSIS — C7951 Secondary malignant neoplasm of bone: Secondary | ICD-10-CM | POA: Insufficient documentation

## 2022-07-02 DIAGNOSIS — Z8249 Family history of ischemic heart disease and other diseases of the circulatory system: Secondary | ICD-10-CM | POA: Diagnosis not present

## 2022-07-02 DIAGNOSIS — Z79899 Other long term (current) drug therapy: Secondary | ICD-10-CM | POA: Insufficient documentation

## 2022-07-02 DIAGNOSIS — M549 Dorsalgia, unspecified: Secondary | ICD-10-CM | POA: Diagnosis not present

## 2022-07-02 DIAGNOSIS — Z923 Personal history of irradiation: Secondary | ICD-10-CM | POA: Insufficient documentation

## 2022-07-02 DIAGNOSIS — M2550 Pain in unspecified joint: Secondary | ICD-10-CM | POA: Diagnosis not present

## 2022-07-02 DIAGNOSIS — Z7289 Other problems related to lifestyle: Secondary | ICD-10-CM | POA: Insufficient documentation

## 2022-07-02 DIAGNOSIS — R0602 Shortness of breath: Secondary | ICD-10-CM | POA: Insufficient documentation

## 2022-07-02 DIAGNOSIS — C787 Secondary malignant neoplasm of liver and intrahepatic bile duct: Secondary | ICD-10-CM | POA: Diagnosis not present

## 2022-07-02 DIAGNOSIS — Z902 Acquired absence of lung [part of]: Secondary | ICD-10-CM | POA: Insufficient documentation

## 2022-07-02 LAB — CBC WITH DIFFERENTIAL/PLATELET
Abs Immature Granulocytes: 0.01 10*3/uL (ref 0.00–0.07)
Basophils Absolute: 0 10*3/uL (ref 0.0–0.1)
Basophils Relative: 1 %
Eosinophils Absolute: 0.6 10*3/uL — ABNORMAL HIGH (ref 0.0–0.5)
Eosinophils Relative: 11 %
HCT: 43.2 % (ref 39.0–52.0)
Hemoglobin: 13.4 g/dL (ref 13.0–17.0)
Immature Granulocytes: 0 %
Lymphocytes Relative: 16 %
Lymphs Abs: 0.9 10*3/uL (ref 0.7–4.0)
MCH: 30.6 pg (ref 26.0–34.0)
MCHC: 31 g/dL (ref 30.0–36.0)
MCV: 98.6 fL (ref 80.0–100.0)
Monocytes Absolute: 0.6 10*3/uL (ref 0.1–1.0)
Monocytes Relative: 10 %
Neutro Abs: 3.6 10*3/uL (ref 1.7–7.7)
Neutrophils Relative %: 62 %
Platelets: 156 10*3/uL (ref 150–400)
RBC: 4.38 MIL/uL (ref 4.22–5.81)
RDW: 12.2 % (ref 11.5–15.5)
WBC: 5.8 10*3/uL (ref 4.0–10.5)
nRBC: 0 % (ref 0.0–0.2)

## 2022-07-02 LAB — COMPREHENSIVE METABOLIC PANEL
ALT: 14 U/L (ref 0–44)
AST: 22 U/L (ref 15–41)
Albumin: 3.9 g/dL (ref 3.5–5.0)
Alkaline Phosphatase: 51 U/L (ref 38–126)
Anion gap: 8 (ref 5–15)
BUN: 9 mg/dL (ref 8–23)
CO2: 42 mmol/L — ABNORMAL HIGH (ref 22–32)
Calcium: 9 mg/dL (ref 8.9–10.3)
Chloride: 89 mmol/L — ABNORMAL LOW (ref 98–111)
Creatinine, Ser: 0.49 mg/dL — ABNORMAL LOW (ref 0.61–1.24)
GFR, Estimated: >60 mL/min (ref >60)
Glucose, Bld: 104 mg/dL — ABNORMAL HIGH (ref 70–99)
Potassium: 4.2 mmol/L (ref 3.5–5.1)
Sodium: 139 mmol/L (ref 135–145)
Total Bilirubin: 0.6 mg/dL (ref 0.3–1.2)
Total Protein: 7 g/dL (ref 6.5–8.1)

## 2022-07-02 LAB — TSH: TSH: 3.897 u[IU]/mL (ref 0.350–4.500)

## 2022-07-02 MED ORDER — SODIUM CHLORIDE 0.9 % IV SOLN
200.0000 mg | Freq: Once | INTRAVENOUS | Status: AC
  Administered 2022-07-02: 200 mg via INTRAVENOUS
  Filled 2022-07-02: qty 8

## 2022-07-02 MED ORDER — HEPARIN SOD (PORK) LOCK FLUSH 100 UNIT/ML IV SOLN
500.0000 [IU] | Freq: Once | INTRAVENOUS | Status: DC | PRN
  Filled 2022-07-02: qty 5

## 2022-07-02 MED ORDER — SODIUM CHLORIDE 0.9 % IV SOLN
Freq: Once | INTRAVENOUS | Status: AC
  Filled 2022-07-02: qty 250

## 2022-07-02 MED ORDER — SODIUM CHLORIDE 0.9% FLUSH
10.0000 mL | Freq: Once | INTRAVENOUS | Status: AC
  Administered 2022-07-02: 10 mL via INTRAVENOUS
  Filled 2022-07-02: qty 10

## 2022-07-02 MED ORDER — HEPARIN SOD (PORK) LOCK FLUSH 100 UNIT/ML IV SOLN
500.0000 [IU] | Freq: Once | INTRAVENOUS | Status: AC
  Administered 2022-07-02: 500 [IU] via INTRAVENOUS
  Filled 2022-07-02: qty 5

## 2022-07-02 NOTE — Assessment & Plan Note (Addendum)
#   Stage IV metastatic non-small cell lung cancer favor adeno; mets to bone; liver.    oligometastatic progression-s/p RT right upper lobe mass [until dec 15th, 2022]. AUG 21st, 2023- Stable post treatment changes in the apical right upper lobe and posterior upper right ribs with no evidence of local tumor recurrence;  Stable sclerotic osseous metastases in the T3 and T4 vertebral bodies status post posterior spinal fusion.  Stable treated tiny liver lesions; No new or progressive metastatic disease in the chest, abdomen or pelvis.  Currently on Keytruda maintenance.   # proceed with [place] maintainance today. Labs today reviewed;  acceptable for treatment today.   Patient currently awaiting CT scan chest and pelvis on 12/08.   # Bone metastases- on zometa 3 mg IVPB.  Currently q 3 M. Ca-9.3.  Patient's last last Zometa- last 12/25/2021.  s/p dental extractions [[facility], Dr.Parks]. Continue multivitamin with calcium plus vitamin D 3 times a day. STABLE. .  # Hypothyroidism- [ Mid sep 2022]  Normal T3-T4.  Currently on 50 mcg a day; SEP 2023- WNL-STABLE. .  # COPD-. On 2-3 lit O2; Trelegy.Clinically- STABLE. Marland Kitchen    HOLD ZOMETA- last 5/31-q 71M;   # DISPOSITION:  # Keytruda today # follow up in 3 weeks-MD;  port/labs- cbc/cmp; TSH; Keytruda-- -Dr.B     .

## 2022-07-02 NOTE — Progress Notes (Signed)
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   # 2021- MAY 26th-Keytrda x2 cycles; [borderline PS] July 7th-carbo Alimta Keytruda cycle #1  #November 2022-oligometastatic progression-right upper lobe subpleural involvement with; s/p radiation finished December 15.  Temporarily held [place].  #Jan fourth 2023-restarted Keytruda.  # NGS/MOLECULAR TESTS:   # PALLIATIVE CARE EVALUATION:  # PAIN MANAGEMENT:    DIAGNOSIS:   STAGE:         ;  GOALS:  CURRENT/MOST RECENT THERAPY :     Cancer of upper lobe of right lung ([place])  11/23/2019 Initial Diagnosis   Cancer of upper lobe of right lung ([place])   12/16/2019 -  Chemotherapy   Patient is on Treatment Plan : LUNG Carboplatin (5) + Pemetrexed (500) + Pembrolizumab (200) D1 q21d Induction x 4 cycles / Maintenance Pemetrexed (500) + Pembrolizumab (200) D1 q21d     12/21/2019 - 02/26/2022 Chemotherapy   Patient is on Treatment Plan : LUNG CARBOplatin / Pemetrexed / Pembrolizumab q21d Induction x 4 cycles / Maintenance Pemetrexed + Pembrolizumab      HISTORY OF PRESENTING ILLNESS: Thin built frail appearing male patient.  No acute distress.  Alone.   Nasal cannula oxygen.  Ambulating independently.    Cody [name] 66 y.o.  male patient  with metastatic non-small cell lung cancer; cord compression status post decompressive surgery; currently on Keytruda maintenance-is here for follow-up.   Patient denies new problems/concerns today.    Patient had couple of dental extractions. Currently awaiting appointment with oral surgeon regarding dental extraction.  No diarrhea.  No nausea no vomiting no rash.  No worsening shortness of breath.  Review of Systems  Constitutional:  Positive for malaise/fatigue. Negative for chills, diaphoresis and fever.  HENT:  Negative for nosebleeds and sore throat.   Eyes:  Negative for double vision.  Respiratory:  Positive for shortness of breath. Negative for cough, hemoptysis, sputum production and wheezing.   Cardiovascular:  Negative for chest pain, palpitations, orthopnea and leg swelling.  Gastrointestinal:  Negative for abdominal pain, blood in stool, constipation, heartburn, melena, nausea and vomiting.  Genitourinary:  Negative for dysuria, frequency and urgency.  Musculoskeletal:  Positive for back pain and joint pain.  Skin: Negative.  Negative for itching and rash.  Neurological:  Negative for dizziness, tingling, focal weakness, weakness and headaches.  Endo/Heme/Allergies:  Does not bruise/bleed easily.  Psychiatric/Behavioral:  Negative for depression. The patient is not nervous/anxious and does not have insomnia.      MEDICAL HISTORY:  Past Medical History:  Diagnosis Date   Allergy    Cancer of upper lobe of right lung ([place]) 10/2019   COPD (chronic obstructive pulmonary disease) (HCC)    Prostate enlargement     SURGICAL HISTORY: Past Surgical History:  Procedure Laterality Date   HERNIA REPAIR Bilateral 1982   HERNIA REPAIR Right 1994   IR IMAGING GUIDED PORT  INSERTION  02/24/2020   LAMINECTOMY FOR EXCISION / EVACUATION INTRASPINAL LESION  11/07/2019   T1-T 6 at [facility]     SOCIAL HISTORY: Social History   Socioeconomic History   Marital status: Married    Spouse  name: [name]    Number of children: 4   Years of education: Not on file   Highest education level: Some college, no degree  Occupational History   Occupation: dispatch and delivery   Tobacco Use   Smoking status: Former    Packs/day: 2.00    Years: 30.00    Total pack years: 60.00    Types: Cigarettes    Quit date: 07/29/2011    Years since quitting: 10.9   Smokeless tobacco: Never   Tobacco comments:    pt vapes  Vaping Use   Vaping Use: Not on file  Substance and Sexual Activity   Alcohol use: Yes    Alcohol/week: 5.0 standard drinks of alcohol    Types: 5 Standard drinks or equivalent per week    Comment: 1-2/day   Drug use: No   Sexual activity: Not Currently    Partners: Female  Other Topics Concern   Not on file  Social History Narrative   Worked in warehouse/ quit smoking in 2015; beer 1/day; in [place]; with wife.    Social Determinants of Health   Financial Resource Strain: Low Risk  (10/21/2019)   Overall Financial Resource Strain (CARDIA)    Difficulty of Paying Living Expenses: Not hard at all  Food Insecurity: No Food Insecurity (10/21/2019)   Hunger Vital Sign    Worried About Running Out of Food in the Last Year: Never true    Ran Out of Food in the Last Year: Never true  Transportation Needs: No Transportation Needs (10/21/2019)   PRAPARE - Hydrologist (Medical): No    Lack of Transportation (Non-Medical): No  Physical Activity: Insufficiently Active (11/25/2019)   Exercise Vital Sign    Days of Exercise per Week: 7 days    Minutes of Exercise per Session: 20 min  Stress: No Stress Concern Present (10/21/2019)   [place]    Feeling of Stress : Not at all  Social Connections: [place] (10/21/2019)   Social Connection and Isolation Panel [NHANES]    Frequency of Communication with Friends and Family: More than three times a week    Frequency of  Social Gatherings with Friends and Family: More than three times a week    Attends Religious Services: More than 4 times per year    Active Member of [employer] or Organizations: Yes    Attends Music therapist: More than 4 times per year    Marital Status: Married  Human resources officer Violence: Not At Risk (10/21/2019)   Humiliation, Afraid, Rape, and Kick questionnaire    Fear of Current or Ex-Partner: No    Emotionally Abused: No    Physically Abused: No    Sexually Abused: No    FAMILY HISTORY: Family History  Problem Relation Age of Onset   Heart disease Father    CVA Mother    Lung cancer Sister    Lung cancer Brother     ALLERGIES:  has No Known Allergies.  MEDICATIONS:  Current Outpatient Medications  Medication Sig Dispense Refill   acetaminophen (TYLENOL) 500 MG tablet Take 500-1,000 mg by mouth every 6 (six) hours as needed for mild pain or fever.  albuterol (VENTOLIN HFA) 108 (90 Base) MCG/ACT inhaler Inhale 2 puffs into the lungs every 4 (four) hours as needed for wheezing or shortness of breath. 18 g 0   Calcium Carb-Cholecalciferol (OYSTER SHELL CALCIUM) 500-400 MG-UNIT TABS Take 500 tablets by mouth.     escitalopram (LEXAPRO) 5 MG tablet TAKE ONE TABLET BY MOUTH EVERY MORNING 90 tablet 0   levothyroxine (SYNTHROID) 50 MCG tablet TAKE ONE TABLET BY MOUTH DAILY BEFORE BREAKFAST 90 tablet 2   lidocaine-prilocaine (EMLA) cream Apply 1 application topically as needed. Apply small amount to port site at least 1 hour prior to it being accessed, cover with plastic wrap 30 g 1   Multiple Vitamin (MULTIVITAMIN) tablet Take 1 tablet by mouth daily.     QUEtiapine (SEROQUEL) 25 MG tablet Take 1 tablet (25 mg total) by mouth at bedtime. 90 tablet 1   No current facility-administered medications for this visit.   Facility-Administered Medications Ordered in Other Visits  Medication Dose Route Frequency Provider Last Rate Last Admin   heparin lock flush 100 unit/mL   500 Units Intravenous Once Charlaine Dalton R, MD          .  PHYSICAL EXAMINATION: ECOG PERFORMANCE STATUS: 3 - Symptomatic, >50% confined to bed  Vitals:   07/02/22 0900  BP: (!) 145/80  Pulse: 80  Resp: 18  Temp: (!) 96.1 F (35.6 C)  SpO2: 99%   There were no vitals filed for this visit.   Physical Exam HENT:     Head: Normocephalic and atraumatic.     Mouth/Throat:     Pharynx: No oropharyngeal exudate.  Eyes:     Pupils: Pupils are equal, round, and reactive to light.  Cardiovascular:     Rate and Rhythm: Normal rate and regular rhythm.  Pulmonary:     Effort: No respiratory distress.     Breath sounds: No wheezing.     Comments: Decreased air entry bilaterally. Abdominal:     General: Bowel sounds are normal. There is no distension.     Palpations: Abdomen is soft. There is no mass.     Tenderness: There is no abdominal tenderness. There is no guarding or rebound.  Musculoskeletal:        General: No tenderness. Normal range of motion.     Cervical back: Normal range of motion and neck supple.  Skin:    General: Skin is warm.  Neurological:     Mental Status: He is alert and oriented to person, place, and time.  Psychiatric:        Mood and Affect: Affect normal.      LABORATORY DATA:  I have reviewed the data as listed Lab Results  Component Value Date   WBC 5.8 07/02/2022   HGB 13.4 07/02/2022   HCT 43.2 07/02/2022   MCV 98.6 07/02/2022   PLT 156 07/02/2022   Recent Labs    05/21/22 1339 06/11/22 1327 07/02/22 0926  NA 139 137 139  K 4.3 4.2 4.2  CL 91* 90* 89*  CO2 41* 40* 42*  GLUCOSE 103* 96 104*  BUN _0 CREATININE 0.61 0.50* 0.49*  CALCIUM 9.0 8.7* 9.0  GFRNONAA >60 >60 >60  PROT 7.5 7.6 7.0  ALBUMIN 4.1 4.1 3.9  AST _1 ALT _2 ALKPHOS 52 52 51  BILITOT 0.4 0.3 0.6    RADIOGRAPHIC STUDIES: I have personally reviewed the radiological images as listed and agreed with the findings in the  report. No  results found.   ASSESSMENT & PLAN:   Cancer of upper lobe of right lung ([place]) # Stage IV metastatic non-small cell lung cancer favor adeno; mets to bone; liver.    oligometastatic progression-s/p RT right upper lobe mass [until dec 15th, 2022]. AUG 21st, 2023- Stable post treatment changes in the apical right upper lobe and posterior upper right ribs with no evidence of local tumor recurrence;  Stable sclerotic osseous metastases in the T3 and T4 vertebral bodies status post posterior spinal fusion.  Stable treated tiny liver lesions; No new or progressive metastatic disease in the chest, abdomen or pelvis.  Currently on Keytruda maintenance.   # proceed with [place] maintainance today. Labs today reviewed;  acceptable for treatment today.   Patient currently awaiting CT scan chest and pelvis on 12/08.   # Bone metastases- on zometa 3 mg IVPB.  Currently q 3 M. Ca-9.3.  Patient's last last Zometa- last 12/25/2021.  s/p dental extractions [[facility], Dr.Parks]. Continue multivitamin with calcium plus vitamin D 3 times a day. STABLE. .  # Hypothyroidism- [ Mid sep 2022]  Normal T3-T4.  Currently on 50 mcg a day; SEP 2023- WNL-STABLE. .  # COPD-. On 2-3 lit O2; Trelegy.Clinically- STABLE. Marland Kitchen    HOLD ZOMETA- last 5/31-q 54M;   # DISPOSITION:  # Keytruda today # follow up in 3 weeks-MD;  port/labs- cbc/cmp; TSH; Keytruda-- -Dr.B     .       All questions were answered. The patient knows to call the clinic with any problems, questions or concerns.    Cammie Sickle, MD 07/02/2022 10:43 AM

## 2022-07-02 NOTE — Progress Notes (Signed)
Patient denies new problems/concerns today.   °

## 2022-07-02 NOTE — Patient Instructions (Signed)
[facility]  Discharge Instructions: Thank you for choosing [facility] to provide your oncology and hematology care.  If you have a lab appointment with the Cancer Center, please go directly to the Cancer Center and check in at the registration area.  Wear comfortable clothing and clothing appropriate for easy access to any Portacath or PICC line.   We strive to give you quality time with your provider. You may need to reschedule your appointment if you arrive late (15 or more minutes).  Arriving late affects you and other patients whose appointments are after yours.  Also, if you miss three or more appointments without notifying the office, you may be dismissed from the clinic at the provider's discretion.      For prescription refill requests, have your pharmacy contact our office and allow 72 hours for refills to be completed.    Today you received the following chemotherapy and/or immunotherapy agents KEYTRUDA      To help prevent nausea and vomiting after your treatment, we encourage you to take your nausea medication as directed.  BELOW ARE SYMPTOMS THAT SHOULD BE REPORTED IMMEDIATELY: *FEVER GREATER THAN 100.4 F (38 C) OR HIGHER *CHILLS OR SWEATING *NAUSEA AND VOMITING THAT IS NOT CONTROLLED WITH YOUR NAUSEA MEDICATION *UNUSUAL SHORTNESS OF BREATH *UNUSUAL BRUISING OR BLEEDING *URINARY PROBLEMS (pain or burning when urinating, or frequent urination) *BOWEL PROBLEMS (unusual diarrhea, constipation, pain near the anus) TENDERNESS IN MOUTH AND THROAT WITH OR WITHOUT PRESENCE OF ULCERS (sore throat, sores in mouth, or a toothache) UNUSUAL RASH, SWELLING OR PAIN  UNUSUAL VAGINAL DISCHARGE OR ITCHING   Items with * indicate a potential emergency and should be followed up as soon as possible or go to the Emergency Department if any problems should occur.  Please show the CHEMOTHERAPY ALERT CARD or IMMUNOTHERAPY ALERT CARD at check-in to  the Emergency Department and triage nurse.  Should you have questions after your visit or need to cancel or reschedule your appointment, please contact [facility]  [phone] and follow the prompts.  Office hours are 8:00 a.m. to 4:30 p.m. Monday - Friday. Please note that voicemails left after 4:00 p.m. may not be returned until the following business day.  We are closed weekends and major holidays. You have access to a nurse at all times for urgent questions. Please call the main number to the clinic [phone] and follow the prompts.  For any non-urgent questions, you may also contact your provider using MyChart. We now offer e-Visits for anyone 18 and older to request care online for non-urgent symptoms. For details visit [email or web address].   Also download the MyChart app! Go to the app store, search "MyChart", open the app, select [facility], and log in with your MyChart username and password.  Masks are optional in the cancer centers. If you would like for your care team to wear a mask while they are taking care of you, please let them know. For doctor visits, patients may have with them one support person who is at least 68 years old. At this time, visitors are not allowed in the infusion area. Pembrolizumab Injection What is this medication? PEMBROLIZUMAB (PEM broe LIZ ue mab) treats some types of cancer. It works by helping your immune system slow or stop the spread of cancer cells. It is a monoclonal antibody. This medicine may be used for other purposes; ask your health care provider or pharmacist if you have questions. COMMON   BRAND NAME(S): Keytruda What should I tell my care team before I take this medication? They need to know if you have any of these conditions: Allogeneic stem cell transplant (uses someone else's stem cells) Autoimmune diseases, such as Crohn disease, ulcerative colitis, lupus History of chest radiation Nervous system  problems, such as Guillain-Barre syndrome, myasthenia gravis Organ transplant An unusual or allergic reaction to pembrolizumab, other medications, foods, dyes, or preservatives Pregnant or trying to get pregnant Breast-feeding How should I use this medication? This medication is injected into a vein. It is given by your care team in a hospital or clinic setting. A special MedGuide will be given to you before each treatment. Be sure to read this information carefully each time. Talk to your care team about the use of this medication in children. While it may be prescribed for children as young as 6 months for selected conditions, precautions do apply. Overdosage: If you think you have taken too much of this medicine contact a poison control center or emergency room at once. NOTE: This medicine is only for you. Do not share this medicine with others. What if I miss a dose? Keep appointments for follow-up doses. It is important not to miss your dose. Call your care team if you are unable to keep an appointment. What may interact with this medication? Interactions have not been studied. This list may not describe all possible interactions. Give your health care provider a list of all the medicines, herbs, non-prescription drugs, or dietary supplements you use. Also tell them if you smoke, drink alcohol, or use illegal drugs. Some items may interact with your medicine. What should I watch for while using this medication? Your condition will be monitored carefully while you are receiving this medication. You may need blood work while taking this medication. This medication may cause serious skin reactions. They can happen weeks to months after starting the medication. Contact your care team right away if you notice fevers or flu-like symptoms with a rash. The rash may be red or purple and then turn into blisters or peeling of the skin. You may also notice a red rash with swelling of the face, lips, or  lymph nodes in your neck or under your arms. Tell your care team right away if you have any change in your eyesight. Talk to your care team if you may be pregnant. Serious birth defects can occur if you take this medication during pregnancy and for 4 months after the last dose. You will need a negative pregnancy test before starting this medication. Contraception is recommended while taking this medication and for 4 months after the last dose. Your care team can help you find the option that works for you. Do not breastfeed while taking this medication and for 4 months after the last dose. What side effects may I notice from receiving this medication? Side effects that you should report to your care team as soon as possible: Allergic reactions--skin rash, itching, hives, swelling of the face, lips, tongue, or throat Dry cough, shortness of breath or trouble breathing Eye pain, redness, irritation, or discharge with blurry or decreased vision Heart muscle inflammation--unusual weakness or fatigue, shortness of breath, chest pain, fast or irregular heartbeat, dizziness, swelling of the ankles, feet, or hands Hormone gland problems--headache, sensitivity to light, unusual weakness or fatigue, dizziness, fast or irregular heartbeat, increased sensitivity to cold or heat, excessive sweating, constipation, hair loss, increased thirst or amount of urine, tremors or shaking, irritability Infusion reactions--chest   pain, shortness of breath or trouble breathing, feeling faint or lightheaded Kidney injury (glomerulonephritis)--decrease in the amount of urine, red or dark brown urine, foamy or bubbly urine, swelling of the ankles, hands, or feet Liver injury--right upper belly pain, loss of appetite, nausea, light-colored stool, dark yellow or brown urine, yellowing skin or eyes, unusual weakness or fatigue Pain, tingling, or numbness in the hands or feet, muscle weakness, change in vision, confusion or trouble  speaking, loss of balance or coordination, trouble walking, seizures Rash, fever, and swollen lymph nodes Redness, blistering, peeling, or loosening of the skin, including inside the mouth Sudden or severe stomach pain, bloody diarrhea, fever, nausea, vomiting Side effects that usually do not require medical attention (report to your care team if they continue or are bothersome): Bone, joint, or muscle pain Diarrhea Fatigue Loss of appetite Nausea Skin rash This list may not describe all possible side effects. Call your doctor for medical advice about side effects. You may report side effects to FDA at 1-800-FDA-1088. Where should I keep my medication? This medication is given in a hospital or clinic. It will not be stored at home. NOTE: This sheet is a summary. It may not cover all possible information. If you have questions about this medicine, talk to your doctor, pharmacist, or health care provider.  2023 Elsevier/Gold Standard (2013-04-04 00:00:00)    

## 2022-07-04 ENCOUNTER — Ambulatory Visit
Admission: RE | Admit: 2022-07-04 | Discharge: 2022-07-04 | Disposition: A | Discharge status: Home / Self Care | Payer: Medicare HMO | Source: Ambulatory Visit | Attending: Medical Oncology | Admitting: Medical Oncology

## 2022-07-04 DIAGNOSIS — C3411 Malignant neoplasm of upper lobe, right bronchus or lung: Secondary | ICD-10-CM | POA: Insufficient documentation

## 2022-07-04 DIAGNOSIS — N3289 Other specified disorders of bladder: Secondary | ICD-10-CM | POA: Diagnosis not present

## 2022-07-04 DIAGNOSIS — J439 Emphysema, unspecified: Secondary | ICD-10-CM | POA: Diagnosis not present

## 2022-07-04 MED ORDER — IOHEXOL 300 MG/ML  SOLN
80.0000 mL | Freq: Once | INTRAMUSCULAR | Status: AC | PRN
  Administered 2022-07-04: 80 mL via INTRAVENOUS

## 2022-07-07 ENCOUNTER — Other Ambulatory Visit: Payer: Self-pay | Admitting: Internal Medicine

## 2022-07-09 ENCOUNTER — Other Ambulatory Visit: Payer: Self-pay | Admitting: Internal Medicine

## 2022-07-09 DIAGNOSIS — F341 Dysthymic disorder: Secondary | ICD-10-CM

## 2022-07-09 DIAGNOSIS — F419 Anxiety disorder, unspecified: Secondary | ICD-10-CM

## 2022-07-10 ENCOUNTER — Encounter: Payer: Self-pay | Admitting: Radiation Oncology

## 2022-07-10 ENCOUNTER — Ambulatory Visit
Admission: RE | Admit: 2022-07-10 | Discharge: 2022-07-10 | Disposition: A | Discharge status: Home / Self Care | Payer: Medicare HMO | Source: Ambulatory Visit | Attending: Radiation Oncology | Admitting: Radiation Oncology

## 2022-07-10 VITALS — BP 143/81 | HR 79 | Temp 97.0°F | Resp 16 | Ht 69.0 in | Wt 125.5 lb

## 2022-07-10 DIAGNOSIS — C3411 Malignant neoplasm of upper lobe, right bronchus or lung: Secondary | ICD-10-CM | POA: Diagnosis not present

## 2022-07-10 DIAGNOSIS — Z87891 Personal history of nicotine dependence: Secondary | ICD-10-CM | POA: Diagnosis not present

## 2022-07-10 DIAGNOSIS — C7951 Secondary malignant neoplasm of bone: Secondary | ICD-10-CM | POA: Diagnosis not present

## 2022-07-10 DIAGNOSIS — Z923 Personal history of irradiation: Secondary | ICD-10-CM | POA: Diagnosis not present

## 2022-07-10 NOTE — Progress Notes (Signed)
Radiation Oncology Follow up Note  Name: Cody [name]   Date:   07/10/2022 MRN:  [id number] DOB: [birth year]    This 66 y.o. male presents to the clinic today for 61-month follow-up status post palliative radiation radiation therapy to his chest and patient with known stage IV lung cancer with extensive extensive metastatic disease currently on Keytruda maintenance.  REFERRING PROVIDER: Steele Sizer, MD  HPI: Patient is a 66 year old male now out 11 months having completed palliative radiation therapy to his chest for known stage IV lung cancer..  Patient is also had cord compression secondary to malignancy at status post laminectomy and T1-6 posterior fusion.  Clinically he is doing extremely well.  Recent CT scan of his chest abdomen pelvis shows no new or progressive findings to suggest progression of metastatic disease in the chest abdomen or pelvis.  COMPLICATIONS OF TREATMENT: none  FOLLOW UP COMPLIANCE: keeps appointments   PHYSICAL EXAM:  BP (!) 143/81 (BP Location: Left Arm, Patient Position: Sitting, Cuff Size: Normal)   Pulse 79   Temp (!) 97 F (36.1 C) (Tympanic)   Resp 16   Ht 5\' 9"  (1.753 m) Comment: stated ht  Wt 125 lb 8 oz (56.9 kg)   BMI 18.53 kg/m  Frail-appearing male in NAD.  Well-developed well-nourished patient in NAD. HEENT reveals PERLA, EOMI, discs not visualized.  Oral cavity is clear. No oral mucosal lesions are identified. Neck is clear without evidence of cervical or supraclavicular adenopathy. Lungs are clear to A&P. Cardiac examination is essentially unremarkable with regular rate and rhythm without murmur rub or thrill. Abdomen is benign with no organomegaly or masses noted. Motor sensory and DTR levels are equal and symmetric in the upper and lower extremities. Cranial nerves II through XII are grossly intact. Proprioception is intact. No peripheral adenopathy or edema is identified. No motor or sensory levels are noted. Crude visual fields are within  normal range.  RADIOLOGY RESULTS: CT scan reviewed compatible with above-stated findings  PLAN: At this time I am going to turn follow-up care over to medical oncology since he is on came through to maintenance.  I be happy to reevaluate the patient anytime should further palliative treatment be indicated.  Patient is to call at anytime with any concerns.  I would like to take this opportunity to thank you for allowing me to participate in the care of your patient.Noreene Filbert, MD

## 2022-07-23 ENCOUNTER — Inpatient Hospital Stay: Payer: Medicare HMO

## 2022-07-23 ENCOUNTER — Inpatient Hospital Stay (HOSPITAL_BASED_OUTPATIENT_CLINIC_OR_DEPARTMENT_OTHER): Payer: Medicare HMO | Admitting: Internal Medicine

## 2022-07-23 ENCOUNTER — Ambulatory Visit: Payer: Medicare HMO | Admitting: Internal Medicine

## 2022-07-23 ENCOUNTER — Encounter: Payer: Self-pay | Admitting: Internal Medicine

## 2022-07-23 ENCOUNTER — Ambulatory Visit: Payer: Medicare HMO

## 2022-07-23 ENCOUNTER — Other Ambulatory Visit: Payer: Medicare HMO

## 2022-07-23 VITALS — BP 155/90 | HR 81 | Temp 96.1°F | Resp 16 | Ht 69.0 in | Wt 123.4 lb

## 2022-07-23 DIAGNOSIS — Z5112 Encounter for antineoplastic immunotherapy: Secondary | ICD-10-CM | POA: Diagnosis not present

## 2022-07-23 DIAGNOSIS — R0602 Shortness of breath: Secondary | ICD-10-CM | POA: Diagnosis not present

## 2022-07-23 DIAGNOSIS — Z7189 Other specified counseling: Secondary | ICD-10-CM

## 2022-07-23 DIAGNOSIS — Z923 Personal history of irradiation: Secondary | ICD-10-CM | POA: Diagnosis not present

## 2022-07-23 DIAGNOSIS — R54 Age-related physical debility: Secondary | ICD-10-CM | POA: Diagnosis not present

## 2022-07-23 DIAGNOSIS — C7951 Secondary malignant neoplasm of bone: Secondary | ICD-10-CM | POA: Diagnosis not present

## 2022-07-23 DIAGNOSIS — C3411 Malignant neoplasm of upper lobe, right bronchus or lung: Secondary | ICD-10-CM

## 2022-07-23 DIAGNOSIS — Z801 Family history of malignant neoplasm of trachea, bronchus and lung: Secondary | ICD-10-CM | POA: Diagnosis not present

## 2022-07-23 DIAGNOSIS — Z8249 Family history of ischemic heart disease and other diseases of the circulatory system: Secondary | ICD-10-CM | POA: Diagnosis not present

## 2022-07-23 DIAGNOSIS — C787 Secondary malignant neoplasm of liver and intrahepatic bile duct: Secondary | ICD-10-CM | POA: Diagnosis not present

## 2022-07-23 DIAGNOSIS — Z7289 Other problems related to lifestyle: Secondary | ICD-10-CM | POA: Diagnosis not present

## 2022-07-23 DIAGNOSIS — Z902 Acquired absence of lung [part of]: Secondary | ICD-10-CM | POA: Diagnosis not present

## 2022-07-23 DIAGNOSIS — M2550 Pain in unspecified joint: Secondary | ICD-10-CM | POA: Diagnosis not present

## 2022-07-23 DIAGNOSIS — Z823 Family history of stroke: Secondary | ICD-10-CM | POA: Diagnosis not present

## 2022-07-23 DIAGNOSIS — M549 Dorsalgia, unspecified: Secondary | ICD-10-CM | POA: Diagnosis not present

## 2022-07-23 DIAGNOSIS — Z79899 Other long term (current) drug therapy: Secondary | ICD-10-CM | POA: Diagnosis not present

## 2022-07-23 LAB — CBC WITH DIFFERENTIAL/PLATELET
Abs Immature Granulocytes: 0.02 10*3/uL (ref 0.00–0.07)
Basophils Absolute: 0 10*3/uL (ref 0.0–0.1)
Basophils Relative: 1 %
Eosinophils Absolute: 0.4 10*3/uL (ref 0.0–0.5)
Eosinophils Relative: 6 %
HCT: 43.6 % (ref 39.0–52.0)
Hemoglobin: 13.9 g/dL (ref 13.0–17.0)
Immature Granulocytes: 0 %
Lymphocytes Relative: 18 %
Lymphs Abs: 1.2 10*3/uL (ref 0.7–4.0)
MCH: 31 pg (ref 26.0–34.0)
MCHC: 31.9 g/dL (ref 30.0–36.0)
MCV: 97.1 fL (ref 80.0–100.0)
Monocytes Absolute: 0.6 10*3/uL (ref 0.1–1.0)
Monocytes Relative: 9 %
Neutro Abs: 4.3 10*3/uL (ref 1.7–7.7)
Neutrophils Relative %: 66 %
Platelets: 176 10*3/uL (ref 150–400)
RBC: 4.49 MIL/uL (ref 4.22–5.81)
RDW: 12.3 % (ref 11.5–15.5)
WBC: 6.6 10*3/uL (ref 4.0–10.5)
nRBC: 0 % (ref 0.0–0.2)

## 2022-07-23 LAB — COMPREHENSIVE METABOLIC PANEL
ALT: 14 U/L (ref 0–44)
AST: 24 U/L (ref 15–41)
Albumin: 4.3 g/dL (ref 3.5–5.0)
Alkaline Phosphatase: 61 U/L (ref 38–126)
Anion gap: 8 (ref 5–15)
BUN: 10 mg/dL (ref 8–23)
CO2: 39 mmol/L — ABNORMAL HIGH (ref 22–32)
Calcium: 8.8 mg/dL — ABNORMAL LOW (ref 8.9–10.3)
Chloride: 91 mmol/L — ABNORMAL LOW (ref 98–111)
Creatinine, Ser: 0.49 mg/dL — ABNORMAL LOW (ref 0.61–1.24)
GFR, Estimated: >60 mL/min (ref >60)
Glucose, Bld: 106 mg/dL — ABNORMAL HIGH (ref 70–99)
Potassium: 4.2 mmol/L (ref 3.5–5.1)
Sodium: 138 mmol/L (ref 135–145)
Total Bilirubin: 0.7 mg/dL (ref 0.3–1.2)
Total Protein: 7.8 g/dL (ref 6.5–8.1)

## 2022-07-23 LAB — TSH: TSH: 2.075 u[IU]/mL (ref 0.350–4.500)

## 2022-07-23 MED ORDER — SODIUM CHLORIDE 0.9 % IV SOLN
200.0000 mg | Freq: Once | INTRAVENOUS | Status: AC
  Administered 2022-07-23: 200 mg via INTRAVENOUS
  Filled 2022-07-23: qty 200

## 2022-07-23 MED ORDER — SODIUM CHLORIDE 0.9 % IV SOLN
Freq: Once | INTRAVENOUS | Status: AC
  Filled 2022-07-23: qty 250

## 2022-07-23 MED ORDER — HEPARIN SOD (PORK) LOCK FLUSH 100 UNIT/ML IV SOLN
500.0000 [IU] | Freq: Once | INTRAVENOUS | Status: DC | PRN
  Filled 2022-07-23: qty 5

## 2022-07-23 NOTE — Patient Instructions (Addendum)
[facility] CANCER CTR AT [place]  Discharge Instructions: Thank you for choosing [facility] to provide your oncology and hematology care.  If you have a lab appointment with the [facility], please go directly to the [facility] and check in at the registration area.  Wear comfortable clothing and clothing appropriate for easy access to any Portacath or PICC line.   We strive to give you quality time with your provider. You may need to reschedule your appointment if you arrive late (15 or more minutes).  Arriving late affects you and other patients whose appointments are after yours.  Also, if you miss three or more appointments without notifying the office, you may be dismissed from the clinic at the provider's discretion.      For prescription refill requests, have your pharmacy contact our office and allow 72 hours for refills to be completed.    Today you received the following chemotherapy and/or immunotherapy agents KEYTRUDA      To help prevent nausea and vomiting after your treatment, we encourage you to take your nausea medication as directed.  BELOW ARE SYMPTOMS THAT SHOULD BE REPORTED IMMEDIATELY: *FEVER GREATER THAN 100.4 F (38 C) OR HIGHER *CHILLS OR SWEATING *NAUSEA AND VOMITING THAT IS NOT CONTROLLED WITH YOUR NAUSEA MEDICATION *UNUSUAL SHORTNESS OF BREATH *UNUSUAL BRUISING OR BLEEDING *URINARY PROBLEMS (pain or burning when urinating, or frequent urination) *BOWEL PROBLEMS (unusual diarrhea, constipation, pain near the anus) TENDERNESS IN MOUTH AND THROAT WITH OR WITHOUT PRESENCE OF ULCERS (sore throat, sores in mouth, or a toothache) UNUSUAL RASH, SWELLING OR PAIN  UNUSUAL VAGINAL DISCHARGE OR ITCHING   Items with * indicate a potential emergency and should be followed up as soon as possible or go to the Emergency Department if any problems should occur.  Please show the CHEMOTHERAPY ALERT CARD or IMMUNOTHERAPY ALERT CARD at check-in to  the Emergency Department and triage nurse.  Should you have questions after your visit or need to cancel or reschedule your appointment, please contact [facility] CANCER [place] AT [place]  [phone] and follow the prompts.  Office hours are 8:00 a.m. to 4:30 p.m. Monday - Friday. Please note that voicemails left after 4:00 p.m. may not be returned until the following business day.  We are closed weekends and major holidays. You have access to a nurse at all times for urgent questions. Please call the main number to the clinic [phone] and follow the prompts.  For any non-urgent questions, you may also contact your provider using MyChart. We now offer e-Visits for anyone 28 and older to request care online for non-urgent symptoms. For details visit [email or web address].   Also download the MyChart app! Go to the app store, search "MyChart", open the app, select [facility], and log in with your MyChart username and password.  Pembrolizumab Injection What is this medication? PEMBROLIZUMAB (PEM broe LIZ ue mab) treats some types of cancer. It works by helping your immune system slow or stop the spread of cancer cells. It is a monoclonal antibody. This medicine may be used for other purposes; ask your health care provider or pharmacist if you have questions. COMMON BRAND NAME(S): Keytruda What should I tell my care team before I take this medication? They need to know if you have any of these conditions: Allogeneic stem cell transplant (uses someone else's stem cells) Autoimmune diseases, such as Crohn disease, ulcerative colitis, lupus History of chest radiation Nervous system problems, such as Guillain-Barre syndrome, myasthenia gravis Organ transplant  An unusual or allergic reaction to pembrolizumab, other medications, foods, dyes, or preservatives Pregnant or trying to get pregnant Breast-feeding How should I use this medication? This medication is injected into a vein. It  is given by your care team in a hospital or clinic setting. A special MedGuide will be given to you before each treatment. Be sure to read this information carefully each time. Talk to your care team about the use of this medication in children. While it may be prescribed for children as young as 6 months for selected conditions, precautions do apply. Overdosage: If you think you have taken too much of this medicine contact a poison control center or emergency room at once. NOTE: This medicine is only for you. Do not share this medicine with others. What if I miss a dose? Keep appointments for follow-up doses. It is important not to miss your dose. Call your care team if you are unable to keep an appointment. What may interact with this medication? Interactions have not been studied. This list may not describe all possible interactions. Give your health care provider a list of all the medicines, herbs, non-prescription drugs, or dietary supplements you use. Also tell them if you smoke, drink alcohol, or use illegal drugs. Some items may interact with your medicine. What should I watch for while using this medication? Your condition will be monitored carefully while you are receiving this medication. You may need blood work while taking this medication. This medication may cause serious skin reactions. They can happen weeks to months after starting the medication. Contact your care team right away if you notice fevers or flu-like symptoms with a rash. The rash may be red or purple and then turn into blisters or peeling of the skin. You may also notice a red rash with swelling of the face, lips, or lymph nodes in your neck or under your arms. Tell your care team right away if you have any change in your eyesight. Talk to your care team if you may be pregnant. Serious birth defects can occur if you take this medication during pregnancy and for 4 months after the last dose. You will need a negative  pregnancy test before starting this medication. Contraception is recommended while taking this medication and for 4 months after the last dose. Your care team can help you find the option that works for you. Do not breastfeed while taking this medication and for 4 months after the last dose. What side effects may I notice from receiving this medication? Side effects that you should report to your care team as soon as possible: Allergic reactions--skin rash, itching, hives, swelling of the face, lips, tongue, or throat Dry cough, shortness of breath or trouble breathing Eye pain, redness, irritation, or discharge with blurry or decreased vision Heart muscle inflammation--unusual weakness or fatigue, shortness of breath, chest pain, fast or irregular heartbeat, dizziness, swelling of the ankles, feet, or hands Hormone gland problems--headache, sensitivity to light, unusual weakness or fatigue, dizziness, fast or irregular heartbeat, increased sensitivity to cold or heat, excessive sweating, constipation, hair loss, increased thirst or amount of urine, tremors or shaking, irritability Infusion reactions--chest pain, shortness of breath or trouble breathing, feeling faint or lightheaded Kidney injury (glomerulonephritis)--decrease in the amount of urine, red or dark brown urine, foamy or bubbly urine, swelling of the ankles, hands, or feet Liver injury--right upper belly pain, loss of appetite, nausea, light-colored stool, dark yellow or brown urine, yellowing skin or eyes, unusual weakness or fatigue  Pain, tingling, or numbness in the hands or feet, muscle weakness, change in vision, confusion or trouble speaking, loss of balance or coordination, trouble walking, seizures Rash, fever, and swollen lymph nodes Redness, blistering, peeling, or loosening of the skin, including inside the mouth Sudden or severe stomach pain, bloody diarrhea, fever, nausea, vomiting Side effects that usually do not require  medical attention (report to your care team if they continue or are bothersome): Bone, joint, or muscle pain Diarrhea Fatigue Loss of appetite Nausea Skin rash This list may not describe all possible side effects. Call your doctor for medical advice about side effects. You may report side effects to FDA at 1-800-FDA-1088. Where should I keep my medication? This medication is given in a hospital or clinic. It will not be stored at home. NOTE: This sheet is a summary. It may not cover all possible information. If you have questions about this medicine, talk to your doctor, pharmacist, or health care provider.  2023 Elsevier/Gold Standard (2013-04-04 00:00:00)    \[id number]\

## 2022-07-23 NOTE — Progress Notes (Unsigned)
Patient denies new problems/concerns today.   °

## 2022-07-23 NOTE — Progress Notes (Unsigned)
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   # 2021- MAY 26th-Keytrda x2 cycles; [borderline PS] July 7th-carbo Alimta Keytruda cycle #1  #November 2022-oligometastatic progression-right upper lobe subpleural involvement with; s/p radiation finished December 15.  Temporarily held [place].  #Jan fourth 2023-restarted Keytruda.  # NGS/MOLECULAR TESTS:   # PALLIATIVE CARE EVALUATION:  # PAIN MANAGEMENT:    DIAGNOSIS:   STAGE:         ;  GOALS:  CURRENT/MOST RECENT THERAPY :     Cancer of upper lobe of right lung ([place])  11/23/2019 Initial Diagnosis   Cancer of upper lobe of right lung ([place])   12/16/2019 -  Chemotherapy   Patient is on Treatment Plan : LUNG Carboplatin (5) + Pemetrexed (500) + Pembrolizumab (200) D1 q21d Induction x 4 cycles / Maintenance Pemetrexed (500) + Pembrolizumab (200) D1 q21d     12/21/2019 - 02/26/2022 Chemotherapy   Patient is on Treatment Plan : LUNG CARBOplatin / Pemetrexed / Pembrolizumab q21d Induction x 4 cycles / Maintenance Pemetrexed + Pembrolizumab      HISTORY OF PRESENTING ILLNESS: Thin built frail appearing male patient.  No acute distress. Accompanied by his wife.   Nasal cannula oxygen.  Ambulating independently.    Cody [name] 66  y.o.  male patient with metastatic non-small cell lung cancer; cord compression status post decompressive surgery; currently on Keytruda maintenance-is here for follow-up/ review the results of the CT scan.   Patient denies new problems/concerns today.  Patient s/p dental extraction.  In January patient is again awaiting appointment with oral surgeon regarding dental extraction.  No diarrhea.  No nausea no vomiting no rash.  No worsening shortness of breath.  Review of Systems  Constitutional:  Positive for malaise/fatigue. Negative for chills, diaphoresis and fever.  HENT:  Negative for nosebleeds and sore throat.   Eyes:  Negative for double vision.  Respiratory:  Positive for shortness of breath. Negative for cough, hemoptysis, sputum production and wheezing.   Cardiovascular:  Negative for chest pain, palpitations, orthopnea and leg swelling.  Gastrointestinal:  Negative for abdominal pain, blood in stool, constipation, heartburn, melena, nausea and vomiting.  Genitourinary:  Negative for dysuria, frequency and urgency.  Musculoskeletal:  Positive for back pain and joint pain.  Skin: Negative.  Negative for itching and rash.  Neurological:  Negative for dizziness, tingling, focal weakness, weakness and headaches.  Endo/Heme/Allergies:  Does not bruise/bleed easily.  Psychiatric/Behavioral:  Negative for depression. The patient is not nervous/anxious and does not have insomnia.      MEDICAL HISTORY:  Past Medical History:  Diagnosis Date   Allergy    Cancer of upper lobe of right lung ([place]) 10/2019   COPD (chronic obstructive pulmonary disease) (HCC)    Prostate enlargement     SURGICAL HISTORY: Past Surgical History:  Procedure Laterality Date   HERNIA REPAIR Bilateral 1982  HERNIA REPAIR Right 1994   IR IMAGING GUIDED PORT INSERTION  02/24/2020   LAMINECTOMY FOR EXCISION / EVACUATION INTRASPINAL LESION  11/07/2019   T1-T 6 at [facility]     SOCIAL HISTORY: Social History    Socioeconomic History   Marital status: Married    Spouse name: [name]    Number of children: 4   Years of education: Not on file   Highest education level: Some college, no degree  Occupational History   Occupation: dispatch and delivery   Tobacco Use   Smoking status: Former    Packs/day: 2.00    Years: 30.00    Total pack years: 60.00    Types: Cigarettes    Quit date: 07/29/2011    Years since quitting: 10.9   Smokeless tobacco: Never   Tobacco comments:    pt vapes  Vaping Use   Vaping Use: Not on file  Substance and Sexual Activity   Alcohol use: Yes    Alcohol/week: 5.0 standard drinks of alcohol    Types: 5 Standard drinks or equivalent per week    Comment: 1-2/day   Drug use: No   Sexual activity: Not Currently    Partners: Female  Other Topics Concern   Not on file  Social History Narrative   Worked in warehouse/ quit smoking in 2015; beer 1/day; in [place]; with wife.    Social Determinants of Health   Financial Resource Strain: Low Risk  (10/21/2019)   Overall Financial Resource Strain (CARDIA)    Difficulty of Paying Living Expenses: Not hard at all  Food Insecurity: No Food Insecurity (10/21/2019)   Hunger Vital Sign    Worried About Running Out of Food in the Last Year: Never true    Ran Out of Food in the Last Year: Never true  Transportation Needs: No Transportation Needs (10/21/2019)   PRAPARE - Hydrologist (Medical): No    Lack of Transportation (Non-Medical): No  Physical Activity: Insufficiently Active (11/25/2019)   Exercise Vital Sign    Days of Exercise per Week: 7 days    Minutes of Exercise per Session: 20 min  Stress: No Stress Concern Present (10/21/2019)   [place]    Feeling of Stress : Not at all  Social Connections: [place] (10/21/2019)   Social Connection and Isolation Panel [NHANES]    Frequency of Communication with  Friends and Family: More than three times a week    Frequency of Social Gatherings with Friends and Family: More than three times a week    Attends Religious Services: More than 4 times per year    Active Member of [employer] or Organizations: Yes    Attends Music therapist: More than 4 times per year    Marital Status: Married  Human resources officer Violence: Not At Risk (10/21/2019)   Humiliation, Afraid, Rape, and Kick questionnaire    Fear of Current or Ex-Partner: No    Emotionally Abused: No    Physically Abused: No    Sexually Abused: No    FAMILY HISTORY: Family History  Problem Relation Age of Onset   Heart disease Father    CVA Mother    Lung cancer Sister    Lung cancer Brother     ALLERGIES:  has No Known Allergies.  MEDICATIONS:  Current Outpatient Medications  Medication Sig Dispense Refill   acetaminophen (TYLENOL) 500 MG tablet Take 500-1,000 mg by mouth every 6 (six)  hours as needed for mild pain or fever.      albuterol (VENTOLIN HFA) 108 (90 Base) MCG/ACT inhaler Inhale 2 puffs into the lungs every 4 (four) hours as needed for wheezing or shortness of breath. 18 g 0   Calcium Carb-Cholecalciferol (OYSTER SHELL CALCIUM) 500-400 MG-UNIT TABS Take 500 tablets by mouth.     escitalopram (LEXAPRO) 5 MG tablet TAKE ONE TABLET BY MOUTH EVERY MORNING 90 tablet 0   levothyroxine (SYNTHROID) 50 MCG tablet TAKE ONE TABLET BY MOUTH DAILY BEFORE BREAKFAST 90 tablet 2   lidocaine-prilocaine (EMLA) cream Apply 1 application topically as needed. Apply small amount to port site at least 1 hour prior to it being accessed, cover with plastic wrap 30 g 1   Multiple Vitamin (MULTIVITAMIN) tablet Take 1 tablet by mouth daily.     QUEtiapine (SEROQUEL) 25 MG tablet Take 1 tablet (25 mg total) by mouth at bedtime. 90 tablet 1   No current facility-administered medications for this visit.   Facility-Administered Medications Ordered in Other Visits  Medication Dose Route Frequency  Provider Last Rate Last Admin   0.9 %  sodium chloride infusion   Intravenous Once Charlaine Dalton R, MD       heparin lock flush 100 unit/mL  500 Units Intracatheter Once PRN Cammie Sickle, MD       pembrolizumab [facility]) 200 mg in sodium chloride 0.9 % 50 mL chemo infusion  200 mg Intravenous Once Charlaine Dalton R, MD          .  PHYSICAL EXAMINATION: ECOG PERFORMANCE STATUS: 3 - Symptomatic, >50% confined to bed  Vitals:   07/23/22 1400  BP: (!) 155/90  Pulse: 81  Resp: 16  Temp: (!) 96.1 F (35.6 C)  SpO2: 100%   Filed Weights   07/23/22 1400  Weight: 123 lb 6.4 oz (56 kg)     Physical Exam HENT:     Head: Normocephalic and atraumatic.     Mouth/Throat:     Pharynx: No oropharyngeal exudate.  Eyes:     Pupils: Pupils are equal, round, and reactive to light.  Cardiovascular:     Rate and Rhythm: Normal rate and regular rhythm.  Pulmonary:     [place]: No respiratory distress.     Breath sounds: No wheezing.     Comments: Decreased air entry bilaterally. Abdominal:     General: Bowel sounds are normal. There is no distension.     Palpations: Abdomen is soft. There is no mass.     Tenderness: There is no abdominal tenderness. There is no guarding or rebound.  Musculoskeletal:        General: No tenderness. Normal range of motion.     Cervical back: Normal range of motion and neck supple.  Skin:    General: Skin is warm.  Neurological:     Mental Status: He is alert and oriented to person, place, and time.  Psychiatric:        Mood and Affect: Affect normal.      LABORATORY DATA:  I have reviewed the data as listed Lab Results  Component Value Date   WBC 6.6 07/23/2022   HGB 13.9 07/23/2022   HCT 43.6 07/23/2022   MCV 97.1 07/23/2022   PLT 176 07/23/2022   Recent Labs    06/11/22 1327 07/02/22 0926 07/23/22 1405  NA 137 139 138  K 4.2 4.2 4.2  CL 90* 89* 91*  CO2 40* 42* 39*  GLUCOSE 96 104* 106*  BUN _0 CREATININE  0.50* 0.49* 0.49*  CALCIUM 8.7* 9.0 8.8*  GFRNONAA >60 >60 >60  PROT 7.6 7.0 7.8  ALBUMIN 4.1 3.9 4.3  AST _1 ALT _2 ALKPHOS 52 51 61  BILITOT 0.3 0.6 0.7    RADIOGRAPHIC STUDIES: I have personally reviewed the radiological images as listed and agreed with the findings in the report. CT CHEST ABDOMEN PELVIS W CONTRAST  Result Date: 07/06/2022 CLINICAL DATA:  Right upper lobe non-small cell lung cancer. Status post right upper lobectomy. Restaging. * Tracking Code: BO * EXAM: CT CHEST, ABDOMEN, AND PELVIS WITH CONTRAST TECHNIQUE: Multidetector CT imaging of the chest, abdomen and pelvis was performed following the standard protocol during bolus administration of intravenous contrast. RADIATION DOSE REDUCTION: This exam was performed according to the departmental dose-optimization program which includes automated exposure control, adjustment of the mA and/or kV according to patient size and/or use of iterative reconstruction technique. CONTRAST:  29m OMNIPAQUE IOHEXOL 300 MG/ML  SOLN COMPARISON:  03/17/2022 FINDINGS: CT CHEST FINDINGS Cardiovascular: The heart size is normal. No substantial pericardial effusion. Coronary artery calcification is evident. No thoracic aortic aneurysm. No substantial atherosclerosis of the thoracic aorta. Right PICC line tip is positioned at the SVC/RA junction. Mediastinum/Nodes: No mediastinal lymphadenopathy. There is no hilar lymphadenopathy. The esophagus has normal imaging features. There is no axillary lymphadenopathy. Lungs/Pleura: Advanced changes of emphysema and centrilobular emphysema again noted. Right apical post treatment scarring is stable. Chronic changes posterior right third and fourth ribs also stable. No new suspicious pulmonary nodule or mass. No focal airspace consolidation. No pleural effusion. Stable scarring posterior right lower lobe Musculoskeletal: Thoracic fusion hardware again noted. No new suspicious lytic or sclerotic osseous  abnormality. CT ABDOMEN PELVIS FINDINGS Hepatobiliary: No suspicious focal abnormality within the liver parenchyma. There is no evidence for gallstones, gallbladder wall thickening, or pericholecystic fluid. No intrahepatic or extrahepatic biliary dilation. Pancreas: No focal mass lesion. No dilatation of the main duct. No intraparenchymal cyst. No peripancreatic edema. Spleen: No splenomegaly. No focal mass lesion. Adrenals/Urinary Tract: No adrenal nodule or mass. Kidneys unremarkable. No evidence for hydroureter. Bladder is nondistended which likely contributes to the appearance of circumferential wall thickening. Stomach/Bowel: Stomach is unremarkable. No gastric wall thickening. No evidence of outlet obstruction. Duodenum is normally positioned as is the ligament of Treitz. No small bowel wall thickening. No small bowel dilatation. The terminal ileum is normal. The appendix is not well visualized, but there is no edema or inflammation in the region of the cecum. No gross colonic mass. No colonic wall thickening. Vascular/Lymphatic: There is moderate atherosclerotic calcification of the abdominal aorta without aneurysm. There is no gastrohepatic or hepatoduodenal ligament lymphadenopathy. No retroperitoneal or mesenteric lymphadenopathy. No pelvic sidewall lymphadenopathy. Reproductive: Prostate gland is enlarged. Other: No intraperitoneal free fluid. Musculoskeletal: No worrisome lytic or sclerotic osseous abnormality. IMPRESSION: 1. Stable exam. No new or progressive findings to suggest recurrent or metastatic disease in the chest, abdomen, or pelvis. 2. Stable post treatment scarring in the right lung apex and the right posterior upper right ribs. 3. Mild circumferential bladder wall thickening accentuated by underdistention. This is a stable finding and may reflect a component of bladder outlet obstruction. 4. Prostatomegaly. 5.  Emphysema (ICD10-J43.9) and Aortic Atherosclerosis (ICD10-170.0) Electronically  Signed   By: EMisty StanleyM.D.   On: 07/06/2022 11:44     ASSESSMENT & PLAN:   [place] # Stage IV metastatic  non-small cell lung cancer favor adeno; mets to bone; liver.    oligometastatic progression-s/p RT right upper lobe mass [until dec 15th, 2022].  Currently on Keytruda maintenance.   # DEC 8th 2023- CT CAP- No evidence of local tumor recurrence;  Stable sclerotic osseous metastases; NO new or progressive metastatic disease in the chest, abdomen or pelvis.   # proceed with [place] maintainance today. Labs today reviewed;  acceptable for treatment today.     # Bone metastases- on zometa 3 mg IVPB.  Currently q 3 M. Ca-9.3.  Patient's last last Zometa- last 12/25/2021.  s/p dental extractions [[facility], Dr.Parks]. Continue multivitamin with calcium plus vitamin D 3 times a day. STABLE; HOLD zometa; next extractions on jan 19th, 2023.   # Hypothyroidism- [ Mid sep 2022]  Normal T3-T4.  Currently on 50 mcg a day; DEC 2023- WNL-STABLE.  # COPD-. On 2-3 lit O2; Trelegy.Clinically- STABLE.   HOLD ZOMETA- last 5/31-q 48M;   # DISPOSITION:  # Keytruda today # follow up in 3 weeks-MD;  port/labs- cbc/cmp; Keytruda-- -Dr.B  # I reviewed the blood work- with the patient in detail; also reviewed the imaging independently [as summarized above]; and with the patient in detail.       .       All questions were answered. The patient knows to call the clinic with any problems, questions or concerns.    Cammie Sickle, MD 07/23/2022 2:51 PM

## 2022-07-23 NOTE — Assessment & Plan Note (Addendum)
#   Stage IV metastatic non-small cell lung cancer favor adeno; mets to bone; liver.    oligometastatic progression-s/p RT right upper lobe mass [until dec 15th, 2022].  Currently on Keytruda maintenance.   # DEC 8th 2023- CT CAP- No evidence of local tumor recurrence;  Stable sclerotic osseous metastases; NO new or progressive metastatic disease in the chest, abdomen or pelvis.   # proceed with [place] maintainance today. Labs today reviewed;  acceptable for treatment today.     # Bone metastases- on zometa 3 mg IVPB.  Currently q 3 M. Ca-9.3.  Patient's last last Zometa- last 12/25/2021.  s/p dental extractions [[facility], Dr.Parks]. Continue multivitamin with calcium plus vitamin D 3 times a day. STABLE; HOLD zometa; next extractions on jan 19th, 2023.   # Hypothyroidism- [ Mid sep 2022]  Normal T3-T4.  Currently on 50 mcg a day; DEC 2023- WNL-STABLE.  # COPD-. On 2-3 lit O2; Trelegy.Clinically- STABLE.   HOLD ZOMETA- last 5/31-q 64M;   # DISPOSITION:  # Keytruda today # follow up in 3 weeks-MD;  port/labs- cbc/cmp; Keytruda-- -Dr.B  # I reviewed the blood work- with the patient in detail; also reviewed the imaging independently [as summarized above]; and with the patient in detail.   Cody [name]

## 2022-07-24 ENCOUNTER — Encounter: Payer: Self-pay | Admitting: Internal Medicine

## 2022-08-01 DIAGNOSIS — J441 Chronic obstructive pulmonary disease with (acute) exacerbation: Secondary | ICD-10-CM | POA: Diagnosis not present

## 2022-08-01 DIAGNOSIS — J9601 Acute respiratory failure with hypoxia: Secondary | ICD-10-CM | POA: Diagnosis not present

## 2022-08-13 ENCOUNTER — Inpatient Hospital Stay: Payer: Medicare HMO

## 2022-08-13 ENCOUNTER — Encounter: Payer: Self-pay | Admitting: Internal Medicine

## 2022-08-13 ENCOUNTER — Inpatient Hospital Stay: Payer: Medicare HMO | Attending: Internal Medicine | Admitting: Internal Medicine

## 2022-08-13 VITALS — BP 129/82 | HR 85 | Temp 97.4°F | Resp 16 | Ht 69.0 in | Wt 126.3 lb

## 2022-08-13 DIAGNOSIS — R0602 Shortness of breath: Secondary | ICD-10-CM | POA: Insufficient documentation

## 2022-08-13 DIAGNOSIS — C787 Secondary malignant neoplasm of liver and intrahepatic bile duct: Secondary | ICD-10-CM | POA: Diagnosis not present

## 2022-08-13 DIAGNOSIS — Z87891 Personal history of nicotine dependence: Secondary | ICD-10-CM | POA: Insufficient documentation

## 2022-08-13 DIAGNOSIS — Z5112 Encounter for antineoplastic immunotherapy: Secondary | ICD-10-CM | POA: Diagnosis not present

## 2022-08-13 DIAGNOSIS — J449 Chronic obstructive pulmonary disease, unspecified: Secondary | ICD-10-CM | POA: Diagnosis not present

## 2022-08-13 DIAGNOSIS — C7951 Secondary malignant neoplasm of bone: Secondary | ICD-10-CM | POA: Insufficient documentation

## 2022-08-13 DIAGNOSIS — R5383 Other fatigue: Secondary | ICD-10-CM | POA: Insufficient documentation

## 2022-08-13 DIAGNOSIS — Z823 Family history of stroke: Secondary | ICD-10-CM | POA: Diagnosis not present

## 2022-08-13 DIAGNOSIS — Z7289 Other problems related to lifestyle: Secondary | ICD-10-CM | POA: Insufficient documentation

## 2022-08-13 DIAGNOSIS — C3411 Malignant neoplasm of upper lobe, right bronchus or lung: Secondary | ICD-10-CM

## 2022-08-13 DIAGNOSIS — Z79899 Other long term (current) drug therapy: Secondary | ICD-10-CM | POA: Insufficient documentation

## 2022-08-13 DIAGNOSIS — E039 Hypothyroidism, unspecified: Secondary | ICD-10-CM | POA: Diagnosis not present

## 2022-08-13 DIAGNOSIS — Z7189 Other specified counseling: Secondary | ICD-10-CM

## 2022-08-13 DIAGNOSIS — Z8249 Family history of ischemic heart disease and other diseases of the circulatory system: Secondary | ICD-10-CM | POA: Diagnosis not present

## 2022-08-13 DIAGNOSIS — M549 Dorsalgia, unspecified: Secondary | ICD-10-CM | POA: Diagnosis not present

## 2022-08-13 DIAGNOSIS — Z923 Personal history of irradiation: Secondary | ICD-10-CM | POA: Diagnosis not present

## 2022-08-13 DIAGNOSIS — M2550 Pain in unspecified joint: Secondary | ICD-10-CM | POA: Diagnosis not present

## 2022-08-13 DIAGNOSIS — Z801 Family history of malignant neoplasm of trachea, bronchus and lung: Secondary | ICD-10-CM | POA: Diagnosis not present

## 2022-08-13 LAB — CBC WITH DIFFERENTIAL/PLATELET
Abs Immature Granulocytes: 0.02 10*3/uL (ref 0.00–0.07)
Basophils Absolute: 0.1 10*3/uL (ref 0.0–0.1)
Basophils Relative: 1 %
Eosinophils Absolute: 0.6 10*3/uL — ABNORMAL HIGH (ref 0.0–0.5)
Eosinophils Relative: 8 %
HCT: 44.1 % (ref 39.0–52.0)
Hemoglobin: 13.8 g/dL (ref 13.0–17.0)
Immature Granulocytes: 0 %
Lymphocytes Relative: 16 %
Lymphs Abs: 1.2 10*3/uL (ref 0.7–4.0)
MCH: 30.9 pg (ref 26.0–34.0)
MCHC: 31.3 g/dL (ref 30.0–36.0)
MCV: 98.7 fL (ref 80.0–100.0)
Monocytes Absolute: 0.7 10*3/uL (ref 0.1–1.0)
Monocytes Relative: 9 %
Neutro Abs: 4.7 10*3/uL (ref 1.7–7.7)
Neutrophils Relative %: 66 %
Platelets: 174 10*3/uL (ref 150–400)
RBC: 4.47 MIL/uL (ref 4.22–5.81)
RDW: 12.4 % (ref 11.5–15.5)
WBC: 7.2 10*3/uL (ref 4.0–10.5)
nRBC: 0 % (ref 0.0–0.2)

## 2022-08-13 LAB — COMPREHENSIVE METABOLIC PANEL
ALT: 13 U/L (ref 0–44)
AST: 20 U/L (ref 15–41)
Albumin: 3.9 g/dL (ref 3.5–5.0)
Alkaline Phosphatase: 54 U/L (ref 38–126)
Anion gap: 7 (ref 5–15)
BUN: 9 mg/dL (ref 8–23)
CO2: 38 mmol/L — ABNORMAL HIGH (ref 22–32)
Calcium: 8.6 mg/dL — ABNORMAL LOW (ref 8.9–10.3)
Chloride: 92 mmol/L — ABNORMAL LOW (ref 98–111)
Creatinine, Ser: 0.58 mg/dL — ABNORMAL LOW (ref 0.61–1.24)
GFR, Estimated: >60 mL/min (ref >60)
Glucose, Bld: 108 mg/dL — ABNORMAL HIGH (ref 70–99)
Potassium: 4.1 mmol/L (ref 3.5–5.1)
Sodium: 137 mmol/L (ref 135–145)
Total Bilirubin: 0.3 mg/dL (ref 0.3–1.2)
Total Protein: 7.2 g/dL (ref 6.5–8.1)

## 2022-08-13 MED ORDER — SODIUM CHLORIDE 0.9 % IV SOLN
200.0000 mg | Freq: Once | INTRAVENOUS | Status: AC
  Administered 2022-08-13: 200 mg via INTRAVENOUS
  Filled 2022-08-13: qty 200

## 2022-08-13 MED ORDER — SODIUM CHLORIDE 0.9 % IV SOLN
Freq: Once | INTRAVENOUS | Status: AC
  Filled 2022-08-13: qty 250

## 2022-08-13 MED ORDER — HEPARIN SOD (PORK) LOCK FLUSH 100 UNIT/ML IV SOLN
500.0000 [IU] | Freq: Once | INTRAVENOUS | Status: AC | PRN
  Administered 2022-08-13: 500 [IU]
  Filled 2022-08-13: qty 5

## 2022-08-13 NOTE — Assessment & Plan Note (Addendum)
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   Oligo-metastatic progression-s/p RT right upper lobe mass [s/p dec 15th, 2022].  Currently on Keytruda maintenance.   # DEC 8th 2023- CT CAP- No evidence of local tumor recurrence;  Stable sclerotic osseous metastases; NO new or progressive metastatic disease in the chest, abdomen or pelvis. stable   # proceed with [place] maintainance today. Labs today reviewed;  acceptable for treatment today.     # Bone metastases- on zometa 3 mg IVPB.  Currently q 3 M. Ca-9.3.  Patient's last last Zometa- last 12/25/2021.  s/p dental extractions [[facility], Dr.Parks]. Continue multivitamin with calcium plus vitamin D 3 times a day. HOLD zometa; next extractions on jan 19th, 2023. stable   # Hypothyroidism- [ Mid sep 2022]  Normal T3-T4.  Currently on 50 mcg a day; DEC 2023- WNL-stable   # COPD-. On 2-3 lit O2; Trelegy.Clinically- stable    HOLD ZOMETA- last 5/31-q 71M;   # DISPOSITION:  # Keytruda today # follow up in 3 weeks-MD;  port/labs- cbc/cmp; vit D 25-OH levels;  Keytruda-- -Dr.B

## 2022-08-13 NOTE — Progress Notes (Signed)
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   # 2021- MAY 26th-Keytrda x2 cycles; [borderline PS] July 7th-carbo Alimta Keytruda cycle #1  #November 2022-oligometastatic progression-right upper lobe subpleural involvement with; s/p radiation finished December 15.  Temporarily held [place].  #Jan fourth 2023-restarted Keytruda.  # NGS/MOLECULAR TESTS:   # PALLIATIVE CARE EVALUATION:  # PAIN MANAGEMENT:    DIAGNOSIS:   STAGE:         ;  GOALS:  CURRENT/MOST RECENT THERAPY :     Cancer of upper lobe of right lung (HCC)  11/23/2019 Initial Diagnosis   Cancer of upper lobe of right lung (HCC)   12/16/2019 -  Chemotherapy   Patient is on Treatment Plan : LUNG Carboplatin (5) + Pemetrexed (500) + Pembrolizumab (200) D1 q21d Induction x 4 cycles / Maintenance Pemetrexed (500) + Pembrolizumab (200) D1 q21d     12/21/2019 - 02/26/2022 Chemotherapy   Patient is on Treatment Plan : LUNG CARBOplatin / Pemetrexed / Pembrolizumab q21d Induction x 4 cycles / Maintenance Pemetrexed + Pembrolizumab      HISTORY OF PRESENTING ILLNESS: Thin built frail appearing male patient.  No acute distress. Accompanied by his wife.   Nasal cannula oxygen.  Ambulating independently.    Cody [name] 67  y.o.  male patient with metastatic non-small cell lung cancer; cord compression status post decompressive surgery; currently on Keytruda maintenance-is here for follow-up.   Patient denies new problems/concerns today.  Patient s/p dental extraction.  Patient has appointment with oral surgeon regarding dental extraction in 2 days from now.  Awaiting dental extraction of the lower jaw.  No diarrhea.  No nausea no vomiting no rash.  No worsening shortness of breath.  Review of Systems  Constitutional:  Positive for malaise/fatigue. Negative for chills, diaphoresis and fever.  HENT:  Negative for nosebleeds and sore throat.   Eyes:  Negative for double vision.  Respiratory:  Positive for shortness of breath. Negative for cough, hemoptysis, sputum production and wheezing.   Cardiovascular:  Negative for chest pain, palpitations, orthopnea and leg swelling.  Gastrointestinal:  Negative for abdominal pain, blood in stool, constipation, heartburn, melena, nausea and vomiting.  Genitourinary:  Negative for dysuria, frequency and urgency.  Musculoskeletal:  Positive for back pain and joint pain.  Skin: Negative.  Negative for itching and rash.  Neurological:  Negative for dizziness, tingling, focal weakness, weakness and headaches.  Endo/Heme/Allergies:  Does not bruise/bleed easily.  Psychiatric/Behavioral:  Negative for depression. The patient is not nervous/anxious and does not have insomnia.      MEDICAL HISTORY:  Past Medical History:  Diagnosis Date   Allergy    Cancer of upper lobe of right lung (HCC) 10/2019   COPD (chronic obstructive pulmonary disease) (HCC)    Prostate enlargement     SURGICAL HISTORY: Past Surgical History:  Procedure Laterality Date   HERNIA REPAIR Bilateral 1982  HERNIA REPAIR Right 1994   IR IMAGING GUIDED PORT INSERTION  02/24/2020   LAMINECTOMY FOR EXCISION / EVACUATION INTRASPINAL LESION  11/07/2019   T1-T 6 at [facility]     SOCIAL HISTORY: Social History    Socioeconomic History   Marital status: Married    Spouse name: [name]    Number of children: 4   Years of education: Not on file   Highest education level: Some college, no degree  Occupational History   Occupation: dispatch and delivery   Tobacco Use   Smoking status: Former    Packs/day: 2.00    Years: 30.00    Total pack years: 60.00    Types: Cigarettes    Quit date: 07/29/2011    Years since quitting: 11.0   Smokeless tobacco: Never   Tobacco comments:    pt vapes  Vaping Use   Vaping Use: Not on file  Substance and Sexual Activity   Alcohol use: Yes    Alcohol/week: 5.0 standard drinks of alcohol    Types: 5 Standard drinks or equivalent per week    Comment: 1-2/day   Drug use: No   Sexual activity: Not Currently    Partners: Female  Other Topics Concern   Not on file  Social History Narrative   Worked in warehouse/ quit smoking in 2015; beer 1/day; in [place]; with wife.    Social Determinants of Health   Financial Resource Strain: Low Risk  (10/21/2019)   Overall Financial Resource Strain (CARDIA)    Difficulty of Paying Living Expenses: Not hard at all  Food Insecurity: No Food Insecurity (10/21/2019)   Hunger Vital Sign    Worried About Running Out of Food in the Last Year: Never true    Ran Out of Food in the Last Year: Never true  Transportation Needs: No Transportation Needs (10/21/2019)   PRAPARE - Administrator, Civil Service (Medical): No    Lack of Transportation (Non-Medical): No  Physical Activity: Insufficiently Active (11/25/2019)   Exercise Vital Sign    Days of Exercise per Week: 7 days    Minutes of Exercise per Session: 20 min  Stress: No Stress Concern Present (10/21/2019)   [employer] of Occupational Health - Occupational Stress Questionnaire    Feeling of Stress : Not at all  Social Connections: Socially Integrated (10/21/2019)   Social Connection and Isolation Panel [NHANES]    Frequency of Communication with  Friends and Family: More than three times a week    Frequency of Social Gatherings with Friends and Family: More than three times a week    Attends Religious Services: More than 4 times per year    Active Member of [employer] or Organizations: Yes    Attends Engineer, structural: More than 4 times per year    Marital Status: Married  Catering manager Violence: Not At Risk (10/21/2019)   Humiliation, Afraid, Rape, and Kick questionnaire    Fear of Current or Ex-Partner: No    Emotionally Abused: No    Physically Abused: No    Sexually Abused: No    FAMILY HISTORY: Family History  Problem Relation Age of Onset   Heart disease Father    CVA Mother    Lung cancer Sister    Lung cancer Brother     ALLERGIES:  has No Known Allergies.  MEDICATIONS:  Current Outpatient Medications  Medication Sig Dispense Refill   acetaminophen (TYLENOL) 500 MG tablet Take 500-1,000 mg by mouth every 6 (six)  hours as needed for mild pain or fever.      albuterol (VENTOLIN HFA) 108 (90 Base) MCG/ACT inhaler Inhale 2 puffs into the lungs every 4 (four) hours as needed for wheezing or shortness of breath. 18 g 0   Calcium Carb-Cholecalciferol (OYSTER SHELL CALCIUM) 500-400 MG-UNIT TABS Take 500 tablets by mouth.     escitalopram (LEXAPRO) 5 MG tablet TAKE ONE TABLET BY MOUTH EVERY MORNING 90 tablet 0   levothyroxine (SYNTHROID) 50 MCG tablet TAKE ONE TABLET BY MOUTH DAILY BEFORE BREAKFAST 90 tablet 2   lidocaine-prilocaine (EMLA) cream Apply 1 application topically as needed. Apply small amount to port site at least 1 hour prior to it being accessed, cover with plastic wrap 30 g 1   Multiple Vitamin (MULTIVITAMIN) tablet Take 1 tablet by mouth daily.     QUEtiapine (SEROQUEL) 25 MG tablet Take 1 tablet (25 mg total) by mouth at bedtime. 90 tablet 1   No current facility-administered medications for this visit.      Marland Kitchen  PHYSICAL EXAMINATION: ECOG PERFORMANCE STATUS: 3 - Symptomatic, >50% confined to  bed  Vitals:   08/13/22 1300  BP: 129/82  Pulse: 85  Resp: 16  Temp: (!) 97.4 F (36.3 C)  SpO2: 97%   Filed Weights   08/13/22 1300  Weight: 126 lb 4.8 oz (57.3 kg)     Physical Exam HENT:     Head: Normocephalic and atraumatic.     Mouth/Throat:     Pharynx: No oropharyngeal exudate.  Eyes:     Pupils: Pupils are equal, round, and reactive to light.  Cardiovascular:     Rate and Rhythm: Normal rate and regular rhythm.  Pulmonary:     Effort: No respiratory distress.     Breath sounds: No wheezing.     Comments: Decreased air entry bilaterally. Abdominal:     General: Bowel sounds are normal. There is no distension.     Palpations: Abdomen is soft. There is no mass.     Tenderness: There is no abdominal tenderness. There is no guarding or rebound.  Musculoskeletal:        General: No tenderness. Normal range of motion.     Cervical back: Normal range of motion and neck supple.  Skin:    General: Skin is warm.  Neurological:     Mental Status: He is alert and oriented to person, place, and time.  Psychiatric:        Mood and Affect: Affect normal.      LABORATORY DATA:  I have reviewed the data as listed Lab Results  Component Value Date   WBC 7.2 08/13/2022   HGB 13.8 08/13/2022   HCT 44.1 08/13/2022   MCV 98.7 08/13/2022   PLT 174 08/13/2022   Recent Labs    07/02/22 0926 07/23/22 1405 08/13/22 1233  NA 139 138 137  K 4.2 4.2 4.1  CL 89* 91* 92*  CO2 42* 39* 38*  GLUCOSE 104* 106* 108*  BUN 9 10 9   CREATININE 0.49* 0.49* 0.58*  CALCIUM 9.0 8.8* 8.6*  GFRNONAA >60 >60 >60  PROT 7.0 7.8 7.2  ALBUMIN 3.9 4.3 3.9  AST 22 24 20   ALT 14 14 13   ALKPHOS 51 61 54  BILITOT 0.6 0.7 0.3    RADIOGRAPHIC STUDIES: I have personally reviewed the radiological images as listed and agreed with the findings in the report. No results found.   ASSESSMENT & PLAN:   Cancer of upper lobe of right lung (  HCC) # Stage IV metastatic non-small cell lung  cancer favor adeno; mets to bone; liver.   Oligo-metastatic progression-s/p RT right upper lobe mass [s/p dec 15th, 2022].  Currently on Keytruda maintenance.   # DEC 8th 2023- CT CAP- No evidence of local tumor recurrence;  Stable sclerotic osseous metastases; NO new or progressive metastatic disease in the chest, abdomen or pelvis. stable   # proceed with [place] maintainance today. Labs today reviewed;  acceptable for treatment today.     # Bone metastases- on zometa 3 mg IVPB.  Currently q 3 M. Ca-9.3.  Patient's last last Zometa- last 12/25/2021.  s/p dental extractions [[facility], Dr.Parks]. Continue multivitamin with calcium plus vitamin D 3 times a day. HOLD zometa; next extractions on jan 19th, 2023. stable   # Hypothyroidism- [ Mid sep 2022]  Normal T3-T4.  Currently on 50 mcg a day; DEC 2023- WNL-stable   # COPD-. On 2-3 lit O2; Trelegy.Clinically- stable    HOLD ZOMETA- last 5/31-q 53M;   # DISPOSITION:  # Keytruda today # follow up in 3 weeks-MD;  port/labs- cbc/cmp; vit D 25-OH levels;  Keytruda-- -Dr.B       All questions were answered. The patient knows to call the clinic with any problems, questions or concerns.    Earna Coder, MD 08/13/2022 1:29 PM

## 2022-08-13 NOTE — Progress Notes (Signed)
Patient denies new problems/concerns today.   °

## 2022-08-15 DIAGNOSIS — Z0000 Encounter for general adult medical examination without abnormal findings: Secondary | ICD-10-CM | POA: Diagnosis not present

## 2022-09-01 DIAGNOSIS — J9601 Acute respiratory failure with hypoxia: Secondary | ICD-10-CM | POA: Diagnosis not present

## 2022-09-01 DIAGNOSIS — J441 Chronic obstructive pulmonary disease with (acute) exacerbation: Secondary | ICD-10-CM | POA: Diagnosis not present

## 2022-09-03 ENCOUNTER — Encounter: Payer: Self-pay | Admitting: Internal Medicine

## 2022-09-03 ENCOUNTER — Inpatient Hospital Stay: Payer: Medicare HMO | Attending: Internal Medicine | Admitting: Internal Medicine

## 2022-09-03 ENCOUNTER — Inpatient Hospital Stay: Payer: Medicare HMO

## 2022-09-03 VITALS — BP 131/84 | HR 77 | Temp 97.5°F | Resp 20 | Wt 126.4 lb

## 2022-09-03 DIAGNOSIS — E039 Hypothyroidism, unspecified: Secondary | ICD-10-CM | POA: Insufficient documentation

## 2022-09-03 DIAGNOSIS — C7951 Secondary malignant neoplasm of bone: Secondary | ICD-10-CM | POA: Diagnosis not present

## 2022-09-03 DIAGNOSIS — Z87891 Personal history of nicotine dependence: Secondary | ICD-10-CM | POA: Diagnosis not present

## 2022-09-03 DIAGNOSIS — Z9981 Dependence on supplemental oxygen: Secondary | ICD-10-CM | POA: Diagnosis not present

## 2022-09-03 DIAGNOSIS — Z7289 Other problems related to lifestyle: Secondary | ICD-10-CM | POA: Diagnosis not present

## 2022-09-03 DIAGNOSIS — Z79890 Hormone replacement therapy (postmenopausal): Secondary | ICD-10-CM | POA: Diagnosis not present

## 2022-09-03 DIAGNOSIS — Z5112 Encounter for antineoplastic immunotherapy: Secondary | ICD-10-CM | POA: Diagnosis not present

## 2022-09-03 DIAGNOSIS — Z8249 Family history of ischemic heart disease and other diseases of the circulatory system: Secondary | ICD-10-CM | POA: Insufficient documentation

## 2022-09-03 DIAGNOSIS — Z79620 Long term (current) use of immunosuppressive biologic: Secondary | ICD-10-CM | POA: Insufficient documentation

## 2022-09-03 DIAGNOSIS — Z801 Family history of malignant neoplasm of trachea, bronchus and lung: Secondary | ICD-10-CM | POA: Diagnosis not present

## 2022-09-03 DIAGNOSIS — C787 Secondary malignant neoplasm of liver and intrahepatic bile duct: Secondary | ICD-10-CM | POA: Insufficient documentation

## 2022-09-03 DIAGNOSIS — C3411 Malignant neoplasm of upper lobe, right bronchus or lung: Secondary | ICD-10-CM

## 2022-09-03 DIAGNOSIS — Z7189 Other specified counseling: Secondary | ICD-10-CM

## 2022-09-03 DIAGNOSIS — R0602 Shortness of breath: Secondary | ICD-10-CM | POA: Insufficient documentation

## 2022-09-03 DIAGNOSIS — Z823 Family history of stroke: Secondary | ICD-10-CM | POA: Diagnosis not present

## 2022-09-03 DIAGNOSIS — R5383 Other fatigue: Secondary | ICD-10-CM | POA: Insufficient documentation

## 2022-09-03 DIAGNOSIS — R54 Age-related physical debility: Secondary | ICD-10-CM | POA: Insufficient documentation

## 2022-09-03 DIAGNOSIS — Z923 Personal history of irradiation: Secondary | ICD-10-CM | POA: Insufficient documentation

## 2022-09-03 DIAGNOSIS — Z79899 Other long term (current) drug therapy: Secondary | ICD-10-CM | POA: Insufficient documentation

## 2022-09-03 DIAGNOSIS — J449 Chronic obstructive pulmonary disease, unspecified: Secondary | ICD-10-CM | POA: Insufficient documentation

## 2022-09-03 DIAGNOSIS — Z7983 Long term (current) use of bisphosphonates: Secondary | ICD-10-CM | POA: Diagnosis not present

## 2022-09-03 LAB — COMPREHENSIVE METABOLIC PANEL
ALT: 15 U/L (ref 0–44)
AST: 23 U/L (ref 15–41)
Albumin: 4.1 g/dL (ref 3.5–5.0)
Alkaline Phosphatase: 59 U/L (ref 38–126)
Anion gap: 9 (ref 5–15)
BUN: 8 mg/dL (ref 8–23)
CO2: 40 mmol/L — ABNORMAL HIGH (ref 22–32)
Calcium: 9.2 mg/dL (ref 8.9–10.3)
Chloride: 90 mmol/L — ABNORMAL LOW (ref 98–111)
Creatinine, Ser: 0.6 mg/dL — ABNORMAL LOW (ref 0.61–1.24)
GFR, Estimated: >60 mL/min (ref >60)
Glucose, Bld: 106 mg/dL — ABNORMAL HIGH (ref 70–99)
Potassium: 4.1 mmol/L (ref 3.5–5.1)
Sodium: 139 mmol/L (ref 135–145)
Total Bilirubin: 0.4 mg/dL (ref 0.3–1.2)
Total Protein: 7.3 g/dL (ref 6.5–8.1)

## 2022-09-03 LAB — CBC WITH DIFFERENTIAL/PLATELET
Abs Immature Granulocytes: 0.02 10*3/uL (ref 0.00–0.07)
Basophils Absolute: 0.1 10*3/uL (ref 0.0–0.1)
Basophils Relative: 1 %
Eosinophils Absolute: 0.7 10*3/uL — ABNORMAL HIGH (ref 0.0–0.5)
Eosinophils Relative: 11 %
HCT: 43.2 % (ref 39.0–52.0)
Hemoglobin: 13.6 g/dL (ref 13.0–17.0)
Immature Granulocytes: 0 %
Lymphocytes Relative: 21 %
Lymphs Abs: 1.2 10*3/uL (ref 0.7–4.0)
MCH: 31 pg (ref 26.0–34.0)
MCHC: 31.5 g/dL (ref 30.0–36.0)
MCV: 98.4 fL (ref 80.0–100.0)
Monocytes Absolute: 0.7 10*3/uL (ref 0.1–1.0)
Monocytes Relative: 12 %
Neutro Abs: 3.3 10*3/uL (ref 1.7–7.7)
Neutrophils Relative %: 55 %
Platelets: 159 10*3/uL (ref 150–400)
RBC: 4.39 MIL/uL (ref 4.22–5.81)
RDW: 12.1 % (ref 11.5–15.5)
WBC: 5.9 10*3/uL (ref 4.0–10.5)
nRBC: 0 % (ref 0.0–0.2)

## 2022-09-03 MED ORDER — HEPARIN SOD (PORK) LOCK FLUSH 100 UNIT/ML IV SOLN
500.0000 [IU] | Freq: Once | INTRAVENOUS | Status: AC | PRN
  Administered 2022-09-03: 500 [IU]
  Filled 2022-09-03: qty 5

## 2022-09-03 MED ORDER — SODIUM CHLORIDE 0.9 % IV SOLN
200.0000 mg | Freq: Once | INTRAVENOUS | Status: AC
  Administered 2022-09-03: 200 mg via INTRAVENOUS
  Filled 2022-09-03: qty 8

## 2022-09-03 MED ORDER — SODIUM CHLORIDE 0.9% FLUSH
10.0000 mL | INTRAVENOUS | Status: DC | PRN
  Administered 2022-09-03: 10 mL
  Filled 2022-09-03: qty 10

## 2022-09-03 MED ORDER — SODIUM CHLORIDE 0.9 % IV SOLN
Freq: Once | INTRAVENOUS | Status: AC
  Filled 2022-09-03: qty 250

## 2022-09-03 NOTE — Progress Notes (Signed)
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   # 2021- MAY 26th-Keytrda x2 cycles; [borderline PS] July 7th-carbo Alimta Keytruda cycle #1  #November 2022-oligometastatic progression-right upper lobe subpleural involvement with; s/p radiation finished December 15.  Temporarily held [place].  #Jan fourth 2023-restarted Keytruda.  # NGS/MOLECULAR TESTS:   # PALLIATIVE CARE EVALUATION:  # PAIN MANAGEMENT:    DIAGNOSIS:   STAGE:         ;  GOALS:  CURRENT/MOST RECENT THERAPY :     Cancer of upper lobe of right lung ([place])  11/23/2019 Initial Diagnosis   Cancer of upper lobe of right lung ([place])   12/16/2019 -  Chemotherapy   Patient is on Treatment Plan : LUNG Carboplatin (5) + Pemetrexed (500) + Pembrolizumab (200) D1 q21d Induction x 4 cycles / Maintenance Pemetrexed (500) + Pembrolizumab (200) D1 q21d     12/21/2019 - 02/26/2022 Chemotherapy   Patient is on Treatment Plan : LUNG CARBOplatin / Pemetrexed / Pembrolizumab q21d Induction x 4 cycles / Maintenance Pemetrexed + Pembrolizumab      HISTORY OF PRESENTING ILLNESS: Thin built frail appearing male patient.  No acute distress. Alone.  Nasal cannula oxygen.  Ambulating independently.    Cody [name] 67 y.o.  male patient  with metastatic non-small cell lung cancer; cord compression status post decompressive surgery; currently on Keytruda maintenance-is here for follow-up.  Patient gave up vaping all together 6 weeks ago. Patient has noticed more snacking. Appetite is almost normal. Feels a little better.  Denies pain. Denies any dizziness or Dyspnea with any exertion. Oxygen dependent. Some fatigue. Pt is able to due all his activities of daily living   Review of Systems  Constitutional:  Positive for malaise/fatigue. Negative for chills, diaphoresis and fever.  HENT:  Negative for nosebleeds and sore throat.   Eyes:  Negative for double vision.  Respiratory:  Positive for shortness of breath. Negative for cough, hemoptysis, sputum production and wheezing.   Cardiovascular:  Negative for chest pain, palpitations, orthopnea and leg swelling.  Gastrointestinal:  Negative for abdominal pain, blood in stool, constipation, heartburn, melena, nausea and vomiting.  Genitourinary:  Negative for dysuria, frequency and urgency.  Musculoskeletal:  Positive for back pain and joint pain.  Skin: Negative.  Negative for itching and rash.  Neurological:  Negative for dizziness, tingling, focal weakness, weakness and headaches.  Endo/Heme/Allergies:  Does not bruise/bleed easily.  Psychiatric/Behavioral:  Negative for depression. The patient is not nervous/anxious and does not have insomnia.      MEDICAL HISTORY:  Past Medical History:  Diagnosis Date   Allergy    Cancer of upper lobe of right lung ([place]) 10/2019   COPD (chronic obstructive pulmonary disease) (HCC)    Prostate enlargement     SURGICAL HISTORY: Past Surgical History:  Procedure Laterality Date   HERNIA REPAIR Bilateral 1982   HERNIA REPAIR Right  1994   IR IMAGING GUIDED PORT INSERTION  02/24/2020   LAMINECTOMY FOR EXCISION / EVACUATION INTRASPINAL LESION  11/07/2019   T1-T 6 at [facility]     SOCIAL HISTORY: Social History   Socioeconomic History    Marital status: Married    Spouse name: [name]    Number of children: 4   Years of education: Not on file   Highest education level: Some college, no degree  Occupational History   Occupation: dispatch and delivery   Tobacco Use   Smoking status: Former    Packs/day: 2.00    Years: 30.00    Total pack years: 60.00    Types: Cigarettes    Quit date: 07/29/2011    Years since quitting: 11.1   Smokeless tobacco: Never   Tobacco comments:    pt vapes  Vaping Use   Vaping Use: Not on file  Substance and Sexual Activity   Alcohol use: Yes    Alcohol/week: 5.0 standard drinks of alcohol    Types: 5 Standard drinks or equivalent per week    Comment: 1-2/day   Drug use: No   Sexual activity: Not Currently    Partners: Female  Other Topics Concern   Not on file  Social History Narrative   Worked in warehouse/ quit smoking in 2015; beer 1/day; in [place]; with wife.    Social Determinants of Health   Financial Resource Strain: Low Risk  (10/21/2019)   Overall Financial Resource Strain (CARDIA)    Difficulty of Paying Living Expenses: Not hard at all  Food Insecurity: No Food Insecurity (10/21/2019)   Hunger Vital Sign    Worried About Running Out of Food in the Last Year: Never true    Ran Out of Food in the Last Year: Never true  Transportation Needs: No Transportation Needs (10/21/2019)   PRAPARE - Hydrologist (Medical): No    Lack of Transportation (Non-Medical): No  Physical Activity: Insufficiently Active (11/25/2019)   Exercise Vital Sign    Days of Exercise per Week: 7 days    Minutes of Exercise per Session: 20 min  Stress: No Stress Concern Present (10/21/2019)   [place]    Feeling of Stress : Not at all  Social Connections: [place] (10/21/2019)   Social Connection and Isolation Panel [NHANES]    Frequency of Communication with Friends and Family: More than  three times a week    Frequency of Social Gatherings with Friends and Family: More than three times a week    Attends Religious Services: More than 4 times per year    Active Member of [employer] or Organizations: Yes    Attends Music therapist: More than 4 times per year    Marital Status: Married  Human resources officer Violence: Not At Risk (10/21/2019)   Humiliation, Afraid, Rape, and Kick questionnaire    Fear of Current or Ex-Partner: No    Emotionally Abused: No    Physically Abused: No    Sexually Abused: No    FAMILY HISTORY: Family History  Problem Relation Age of Onset   Heart disease Father    CVA Mother    Lung cancer Sister    Lung cancer Brother     ALLERGIES:  has No Known Allergies.  MEDICATIONS:  Current Outpatient Medications  Medication Sig Dispense Refill   acetaminophen (TYLENOL) 500 MG tablet Take 500-1,000 mg by mouth every 6 (six) hours as needed  for mild pain or fever.      albuterol (VENTOLIN HFA) 108 (90 Base) MCG/ACT inhaler Inhale 2 puffs into the lungs every 4 (four) hours as needed for wheezing or shortness of breath. 18 g 0   Calcium Carb-Cholecalciferol (OYSTER SHELL CALCIUM) 500-400 MG-UNIT TABS Take 500 tablets by mouth.     escitalopram (LEXAPRO) 5 MG tablet TAKE ONE TABLET BY MOUTH EVERY MORNING 90 tablet 0   levothyroxine (SYNTHROID) 50 MCG tablet TAKE ONE TABLET BY MOUTH DAILY BEFORE BREAKFAST 90 tablet 2   lidocaine-prilocaine (EMLA) cream Apply 1 application topically as needed. Apply small amount to port site at least 1 hour prior to it being accessed, cover with plastic wrap 30 g 1   Multiple Vitamin (MULTIVITAMIN) tablet Take 1 tablet by mouth daily.     QUEtiapine (SEROQUEL) 25 MG tablet Take 1 tablet (25 mg total) by mouth at bedtime. 90 tablet 1   No current facility-administered medications for this visit.   Facility-Administered Medications Ordered in Other Visits  Medication Dose Route Frequency Provider Last Rate Last Admin    heparin lock flush 100 unit/mL  500 Units Intracatheter Once PRN Cammie Sickle, MD       sodium chloride flush (NS) 0.9 % injection 10 mL  10 mL Intracatheter PRN Cammie Sickle, MD          .  PHYSICAL EXAMINATION: ECOG PERFORMANCE STATUS: 3 - Symptomatic, >50% confined to bed  Vitals:   09/03/22 1012  BP: 131/84  Pulse: 77  Resp: 20  Temp: (!) 97.5 F (36.4 C)  SpO2: 100%   Filed Weights   09/03/22 1012  Weight: 126 lb 6.4 oz (57.3 kg)     Physical Exam HENT:     Head: Normocephalic and atraumatic.     Mouth/Throat:     Pharynx: No oropharyngeal exudate.  Eyes:     Pupils: Pupils are equal, round, and reactive to light.  Cardiovascular:     Rate and Rhythm: Normal rate and regular rhythm.  Pulmonary:     Effort: No respiratory distress.     Breath sounds: No wheezing.     Comments: Decreased air entry bilaterally. Abdominal:     General: Bowel sounds are normal. There is no distension.     Palpations: Abdomen is soft. There is no mass.     Tenderness: There is no abdominal tenderness. There is no guarding or rebound.  Musculoskeletal:        General: No tenderness. Normal range of motion.     Cervical back: Normal range of motion and neck supple.  Skin:    General: Skin is warm.  Neurological:     Mental Status: He is alert and oriented to person, place, and time.  Psychiatric:        Mood and Affect: Affect normal.      LABORATORY DATA:  I have reviewed the data as listed Lab Results  Component Value Date   WBC 5.9 09/03/2022   HGB 13.6 09/03/2022   HCT 43.2 09/03/2022   MCV 98.4 09/03/2022   PLT 159 09/03/2022   Recent Labs    07/23/22 1405 08/13/22 1233 09/03/22 0930  NA 138 137 139  K 4.2 4.1 4.1  CL 91* 92* 90*  CO2 39* 38* 40*  GLUCOSE 106* 108* 106*  BUN 10 9 8   CREATININE 0.49* 0.58* 0.60*  CALCIUM 8.8* 8.6* 9.2  GFRNONAA >60 >60 >60  PROT 7.8 7.2 7.3  ALBUMIN 4.3 3.9  4.1  AST 24 20 23   ALT 14 13 15    ALKPHOS 61 54 59  BILITOT 0.7 0.3 0.4    RADIOGRAPHIC STUDIES: I have personally reviewed the radiological images as listed and agreed with the findings in the report. No results found.   ASSESSMENT & PLAN:   Cancer of upper lobe of right lung ([place]) # Stage IV metastatic non-small cell lung cancer favor adeno; mets to bone; liver.   Oligo-metastatic progression-s/p RT right upper lobe mass [s/p dec 15th, 2022].  Currently on Keytruda maintenance.   # DEC 8th 2023- CT CAP- No evidence of local tumor recurrence;  Stable sclerotic osseous metastases; NO new or progressive metastatic disease in the chest, abdomen or pelvis. Stable. Will repeat scan in March 2024; will order at next visit. Stable.   # proceed with [place] maintainance today. Labs today reviewed;  acceptable for treatment today.     # Bone metastases- on zometa 3 mg IVPB.  Currently q 3 M. Ca-9.3.  Patient's last last Zometa- last 12/25/2021.  s/p dental extractions [[facility], Dr.Parks]. Continue multivitamin with calcium plus vitamin D 3 times a day.  S/p jan 19th, 2023. HOLD zometa;  # Hypothyroidism- [ Mid sep 2022]  Normal T3-T4.  Currently on 50 mcg a day; DEC 2023- WNL-stable   # COPD-. On 2-3 lit O2; Trelegy.Clinically- stable    HOLD ZOMETA- last 12/25/2021-q 35M;   # DISPOSITION:  # Keytruda today # follow up in 3 weeks-MD;  port/labs- cbc/cmp; TSH-Keytruda-- -Dr.B        All questions were answered. The patient knows to call the clinic with any problems, questions or concerns.    Cammie Sickle, MD 09/03/2022 12:20 PM

## 2022-09-03 NOTE — Assessment & Plan Note (Addendum)
#   Stage IV metastatic non-small cell lung cancer favor adeno; mets to bone; liver.   Oligo-metastatic progression-s/p RT right upper lobe mass [s/p dec 15th, 2022].  Currently on Keytruda maintenance.   # DEC 8th 2023- CT CAP- No evidence of local tumor recurrence;  Stable sclerotic osseous metastases; NO new or progressive metastatic disease in the chest, abdomen or pelvis. Stable. Will repeat scan in March 2024; will order at next visit. Stable.   # proceed with [place] maintainance today. Labs today reviewed;  acceptable for treatment today.     # Bone metastases- on zometa 3 mg IVPB.  Currently q 3 M. Ca-9.3.  Patient's last last Zometa- last 12/25/2021.  s/p dental extractions [[facility], Dr.Parks]. Continue multivitamin with calcium plus vitamin D 3 times a day.  S/p jan 19th, 2023. HOLD zometa;  # Hypothyroidism- [ Mid sep 2022]  Normal T3-T4.  Currently on 50 mcg a day; DEC 2023- WNL-stable   # COPD-. On 2-3 lit O2; Trelegy.Clinically- stable    HOLD ZOMETA- last 12/25/2021-q 62M;   # DISPOSITION:  # Keytruda today # follow up in 3 weeks-MD;  port/labs- cbc/cmp; TSH-Keytruda-- -Dr.B

## 2022-09-03 NOTE — Progress Notes (Signed)
Gave up vaping all together 6 weeks ago.Marland Kitchen Has noticed more snacking. Appetite is almost mormal. Feels a little better. Denies pain. Denies any dizziness. Dyspnea with any exertion. Oxygen dependent. Some fatigue. Pt is able to due all his activities of daily living.

## 2022-09-03 NOTE — Patient Instructions (Signed)
[facility]  Discharge Instructions: Thank you for choosing [facility] to provide your oncology and hematology care.  If you have a lab appointment with the [facility], please go directly to the [facility] and check in at the registration area.  Wear comfortable clothing and clothing appropriate for easy access to any Portacath or PICC line.   We strive to give you quality time with your provider. You may need to reschedule your appointment if you arrive late (15 or more minutes).  Arriving late affects you and other patients whose appointments are after yours.  Also, if you miss three or more appointments without notifying the office, you may be dismissed from the clinic at the provider's discretion.      For prescription refill requests, have your pharmacy contact our office and allow 72 hours for refills to be completed.    Today you received the following chemotherapy and/or immunotherapy agents KEYTRUDA      To help prevent nausea and vomiting after your treatment, we encourage you to take your nausea medication as directed.  BELOW ARE SYMPTOMS THAT SHOULD BE REPORTED IMMEDIATELY: *FEVER GREATER THAN 100.4 F (38 C) OR HIGHER *CHILLS OR SWEATING *NAUSEA AND VOMITING THAT IS NOT CONTROLLED WITH YOUR NAUSEA MEDICATION *UNUSUAL SHORTNESS OF BREATH *UNUSUAL BRUISING OR BLEEDING *URINARY PROBLEMS (pain or burning when urinating, or frequent urination) *BOWEL PROBLEMS (unusual diarrhea, constipation, pain near the anus) TENDERNESS IN MOUTH AND THROAT WITH OR WITHOUT PRESENCE OF ULCERS (sore throat, sores in mouth, or a toothache) UNUSUAL RASH, SWELLING OR PAIN  UNUSUAL VAGINAL DISCHARGE OR ITCHING   Items with * indicate a potential emergency and should be followed up as soon as possible or go to the Emergency Department if any problems should occur.  Please show the CHEMOTHERAPY ALERT CARD or IMMUNOTHERAPY ALERT CARD at check-in to  the Emergency Department and triage nurse.  Should you have questions after your visit or need to cancel or reschedule your appointment, please contact [facility]  [phone] and follow the prompts.  Office hours are 8:00 a.m. to 4:30 p.m. Monday - Friday. Please note that voicemails left after 4:00 p.m. may not be returned until the following business day.  We are closed weekends and major holidays. You have access to a nurse at all times for urgent questions. Please call the main number to the clinic [phone] and follow the prompts.  For any non-urgent questions, you may also contact your provider using MyChart. We now offer e-Visits for anyone 62 and older to request care online for non-urgent symptoms. For details visit [email or web address].   Also download the MyChart app! Go to the app store, search "MyChart", open the app, select [facility], and log in with your MyChart username and password.  Pembrolizumab Injection What is this medication? PEMBROLIZUMAB (PEM broe LIZ ue mab) treats some types of cancer. It works by helping your immune system slow or stop the spread of cancer cells. It is a monoclonal antibody. This medicine may be used for other purposes; ask your health care provider or pharmacist if you have questions. COMMON BRAND NAME(S): Keytruda What should I tell my care team before I take this medication? They need to know if you have any of these conditions: Allogeneic stem cell transplant (uses someone else's stem cells) Autoimmune diseases, such as Crohn disease, ulcerative colitis, lupus History of chest radiation Nervous system problems, such as Guillain-Barre syndrome, myasthenia gravis  Organ transplant An unusual or allergic reaction to pembrolizumab, other medications, foods, dyes, or preservatives Pregnant or trying to get pregnant Breast-feeding How should I use this medication? This medication is injected into a vein. It  is given by your care team in a hospital or clinic setting. A special MedGuide will be given to you before each treatment. Be sure to read this information carefully each time. Talk to your care team about the use of this medication in children. While it may be prescribed for children as young as 6 months for selected conditions, precautions do apply. Overdosage: If you think you have taken too much of this medicine contact a poison control center or emergency room at once. NOTE: This medicine is only for you. Do not share this medicine with others. What if I miss a dose? Keep appointments for follow-up doses. It is important not to miss your dose. Call your care team if you are unable to keep an appointment. What may interact with this medication? Interactions have not been studied. This list may not describe all possible interactions. Give your health care provider a list of all the medicines, herbs, non-prescription drugs, or dietary supplements you use. Also tell them if you smoke, drink alcohol, or use illegal drugs. Some items may interact with your medicine. What should I watch for while using this medication? Your condition will be monitored carefully while you are receiving this medication. You may need blood work while taking this medication. This medication may cause serious skin reactions. They can happen weeks to months after starting the medication. Contact your care team right away if you notice fevers or flu-like symptoms with a rash. The rash may be red or purple and then turn into blisters or peeling of the skin. You may also notice a red rash with swelling of the face, lips, or lymph nodes in your neck or under your arms. Tell your care team right away if you have any change in your eyesight. Talk to your care team if you may be pregnant. Serious birth defects can occur if you take this medication during pregnancy and for 4 months after the last dose. You will need a negative  pregnancy test before starting this medication. Contraception is recommended while taking this medication and for 4 months after the last dose. Your care team can help you find the option that works for you. Do not breastfeed while taking this medication and for 4 months after the last dose. What side effects may I notice from receiving this medication? Side effects that you should report to your care team as soon as possible: Allergic reactions--skin rash, itching, hives, swelling of the face, lips, tongue, or throat Dry cough, shortness of breath or trouble breathing Eye pain, redness, irritation, or discharge with blurry or decreased vision Heart muscle inflammation--unusual weakness or fatigue, shortness of breath, chest pain, fast or irregular heartbeat, dizziness, swelling of the ankles, feet, or hands Hormone gland problems--headache, sensitivity to light, unusual weakness or fatigue, dizziness, fast or irregular heartbeat, increased sensitivity to cold or heat, excessive sweating, constipation, hair loss, increased thirst or amount of urine, tremors or shaking, irritability Infusion reactions--chest pain, shortness of breath or trouble breathing, feeling faint or lightheaded Kidney injury (glomerulonephritis)--decrease in the amount of urine, red or dark brown urine, foamy or bubbly urine, swelling of the ankles, hands, or feet Liver injury--right upper belly pain, loss of appetite, nausea, light-colored stool, dark yellow or brown urine, yellowing skin or eyes, unusual weakness  or fatigue Pain, tingling, or numbness in the hands or feet, muscle weakness, change in vision, confusion or trouble speaking, loss of balance or coordination, trouble walking, seizures Rash, fever, and swollen lymph nodes Redness, blistering, peeling, or loosening of the skin, including inside the mouth Sudden or severe stomach pain, bloody diarrhea, fever, nausea, vomiting Side effects that usually do not require  medical attention (report to your care team if they continue or are bothersome): Bone, joint, or muscle pain Diarrhea Fatigue Loss of appetite Nausea Skin rash This list may not describe all possible side effects. Call your doctor for medical advice about side effects. You may report side effects to FDA at 1-800-FDA-1088. Where should I keep my medication? This medication is given in a hospital or clinic. It will not be stored at home. NOTE: This sheet is a summary. It may not cover all possible information. If you have questions about this medicine, talk to your doctor, pharmacist, or health care provider.  2023 Elsevier/Gold Standard (2013-04-04 00:00:00)    \[id number]\

## 2022-09-04 LAB — MISC LABCORP TEST (SEND OUT): Labcorp test code: 81950

## 2022-09-18 ENCOUNTER — Other Ambulatory Visit: Payer: Self-pay | Admitting: Internal Medicine

## 2022-09-18 DIAGNOSIS — G4700 Insomnia, unspecified: Secondary | ICD-10-CM

## 2022-09-23 ENCOUNTER — Other Ambulatory Visit: Payer: Self-pay | Admitting: Internal Medicine

## 2022-09-23 NOTE — Telephone Encounter (Signed)
Component Ref Range & Units 2 mo ago (07/23/22) 2 mo ago (07/02/22) 5 mo ago (04/09/22) 7 mo ago (02/05/22) 10 mo ago (11/13/21) 11 mo ago (10/02/21) 1 yr ago (07/31/21)  TSH 0.350 - 4.500 uIU/mL 2.075 3.897 CM 3.238 CM 4.351 CM 3.325 CM 5.440 High  R 5.510 High  R  Comment: Performed by a 3rd Generation assay with a functional sensitivity of <=0.01 uIU/mL. Performed at [facility], [address] [place], [place] [address]  Resulting Agency  [facility] CLIN [place] [facility] CLIN [place] [facility] [place] [facility] LAB [place] [facility] [place] [facility] LAB         Specimen Collected: 07/23/22 14:05 Last Resulted: 07/23/22 18:58

## 2022-09-24 ENCOUNTER — Inpatient Hospital Stay: Payer: Medicare HMO

## 2022-09-24 ENCOUNTER — Encounter: Payer: Self-pay | Admitting: Internal Medicine

## 2022-09-24 ENCOUNTER — Inpatient Hospital Stay (HOSPITAL_BASED_OUTPATIENT_CLINIC_OR_DEPARTMENT_OTHER): Payer: Medicare HMO | Admitting: Internal Medicine

## 2022-09-24 VITALS — BP 139/74

## 2022-09-24 VITALS — BP 141/90 | HR 87 | Temp 97.5°F | Resp 20 | Wt 126.2 lb

## 2022-09-24 DIAGNOSIS — C3411 Malignant neoplasm of upper lobe, right bronchus or lung: Secondary | ICD-10-CM

## 2022-09-24 DIAGNOSIS — Z79899 Other long term (current) drug therapy: Secondary | ICD-10-CM | POA: Diagnosis not present

## 2022-09-24 DIAGNOSIS — R0602 Shortness of breath: Secondary | ICD-10-CM | POA: Diagnosis not present

## 2022-09-24 DIAGNOSIS — Z5112 Encounter for antineoplastic immunotherapy: Secondary | ICD-10-CM | POA: Diagnosis not present

## 2022-09-24 DIAGNOSIS — Z7189 Other specified counseling: Secondary | ICD-10-CM

## 2022-09-24 DIAGNOSIS — Z9981 Dependence on supplemental oxygen: Secondary | ICD-10-CM | POA: Diagnosis not present

## 2022-09-24 DIAGNOSIS — Z7983 Long term (current) use of bisphosphonates: Secondary | ICD-10-CM | POA: Diagnosis not present

## 2022-09-24 DIAGNOSIS — R54 Age-related physical debility: Secondary | ICD-10-CM | POA: Diagnosis not present

## 2022-09-24 DIAGNOSIS — J449 Chronic obstructive pulmonary disease, unspecified: Secondary | ICD-10-CM | POA: Diagnosis not present

## 2022-09-24 DIAGNOSIS — Z79890 Hormone replacement therapy (postmenopausal): Secondary | ICD-10-CM | POA: Diagnosis not present

## 2022-09-24 DIAGNOSIS — Z823 Family history of stroke: Secondary | ICD-10-CM | POA: Diagnosis not present

## 2022-09-24 DIAGNOSIS — Z79620 Long term (current) use of immunosuppressive biologic: Secondary | ICD-10-CM | POA: Diagnosis not present

## 2022-09-24 DIAGNOSIS — Z8249 Family history of ischemic heart disease and other diseases of the circulatory system: Secondary | ICD-10-CM | POA: Diagnosis not present

## 2022-09-24 DIAGNOSIS — Z923 Personal history of irradiation: Secondary | ICD-10-CM | POA: Diagnosis not present

## 2022-09-24 DIAGNOSIS — C787 Secondary malignant neoplasm of liver and intrahepatic bile duct: Secondary | ICD-10-CM | POA: Diagnosis not present

## 2022-09-24 DIAGNOSIS — R5383 Other fatigue: Secondary | ICD-10-CM | POA: Diagnosis not present

## 2022-09-24 DIAGNOSIS — Z7289 Other problems related to lifestyle: Secondary | ICD-10-CM | POA: Diagnosis not present

## 2022-09-24 DIAGNOSIS — Z801 Family history of malignant neoplasm of trachea, bronchus and lung: Secondary | ICD-10-CM | POA: Diagnosis not present

## 2022-09-24 DIAGNOSIS — Z87891 Personal history of nicotine dependence: Secondary | ICD-10-CM | POA: Diagnosis not present

## 2022-09-24 DIAGNOSIS — C7951 Secondary malignant neoplasm of bone: Secondary | ICD-10-CM | POA: Diagnosis not present

## 2022-09-24 DIAGNOSIS — E039 Hypothyroidism, unspecified: Secondary | ICD-10-CM | POA: Diagnosis not present

## 2022-09-24 LAB — CBC WITH DIFFERENTIAL/PLATELET
Abs Immature Granulocytes: 0.01 10*3/uL (ref 0.00–0.07)
Basophils Absolute: 0 10*3/uL (ref 0.0–0.1)
Basophils Relative: 1 %
Eosinophils Absolute: 0.6 10*3/uL — ABNORMAL HIGH (ref 0.0–0.5)
Eosinophils Relative: 10 %
HCT: 42.2 % (ref 39.0–52.0)
Hemoglobin: 13.6 g/dL (ref 13.0–17.0)
Immature Granulocytes: 0 %
Lymphocytes Relative: 20 %
Lymphs Abs: 1.2 10*3/uL (ref 0.7–4.0)
MCH: 31.1 pg (ref 26.0–34.0)
MCHC: 32.2 g/dL (ref 30.0–36.0)
MCV: 96.6 fL (ref 80.0–100.0)
Monocytes Absolute: 0.7 10*3/uL (ref 0.1–1.0)
Monocytes Relative: 11 %
Neutro Abs: 3.5 10*3/uL (ref 1.7–7.7)
Neutrophils Relative %: 58 %
Platelets: 165 10*3/uL (ref 150–400)
RBC: 4.37 MIL/uL (ref 4.22–5.81)
RDW: 12.1 % (ref 11.5–15.5)
WBC: 6 10*3/uL (ref 4.0–10.5)
nRBC: 0 % (ref 0.0–0.2)

## 2022-09-24 LAB — COMPREHENSIVE METABOLIC PANEL
ALT: 14 U/L (ref 0–44)
AST: 22 U/L (ref 15–41)
Albumin: 4.2 g/dL (ref 3.5–5.0)
Alkaline Phosphatase: 56 U/L (ref 38–126)
Anion gap: 10 (ref 5–15)
BUN: 10 mg/dL (ref 8–23)
CO2: 38 mmol/L — ABNORMAL HIGH (ref 22–32)
Calcium: 9.3 mg/dL (ref 8.9–10.3)
Chloride: 91 mmol/L — ABNORMAL LOW (ref 98–111)
Creatinine, Ser: 0.59 mg/dL — ABNORMAL LOW (ref 0.61–1.24)
GFR, Estimated: >60 mL/min (ref >60)
Glucose, Bld: 103 mg/dL — ABNORMAL HIGH (ref 70–99)
Potassium: 3.9 mmol/L (ref 3.5–5.1)
Sodium: 139 mmol/L (ref 135–145)
Total Bilirubin: 0.5 mg/dL (ref 0.3–1.2)
Total Protein: 7.8 g/dL (ref 6.5–8.1)

## 2022-09-24 LAB — TSH: TSH: 3.641 u[IU]/mL (ref 0.350–4.500)

## 2022-09-24 MED ORDER — SODIUM CHLORIDE 0.9 % IV SOLN
Freq: Once | INTRAVENOUS | Status: AC
  Filled 2022-09-24: qty 250

## 2022-09-24 MED ORDER — HEPARIN SOD (PORK) LOCK FLUSH 100 UNIT/ML IV SOLN
500.0000 [IU] | Freq: Once | INTRAVENOUS | Status: AC | PRN
  Administered 2022-09-24: 500 [IU]
  Filled 2022-09-24: qty 5

## 2022-09-24 MED ORDER — SODIUM CHLORIDE 0.9% FLUSH
10.0000 mL | INTRAVENOUS | Status: DC | PRN
  Administered 2022-09-24: 10 mL
  Filled 2022-09-24: qty 10

## 2022-09-24 MED ORDER — SODIUM CHLORIDE 0.9 % IV SOLN
200.0000 mg | Freq: Once | INTRAVENOUS | Status: AC
  Administered 2022-09-24: 200 mg via INTRAVENOUS
  Filled 2022-09-24: qty 8

## 2022-09-24 NOTE — Assessment & Plan Note (Addendum)
#   Stage IV metastatic non-small cell lung cancer favor adeno; mets to bone; liver.   Oligo-metastatic progression-s/p RT right upper lobe mass [s/p dec 15th, 2022].  Currently on Keytruda maintenance.   # DEC 8th 2023- CT CAP- No evidence of local tumor recurrence;  Stable sclerotic osseous metastases; NO new or progressive metastatic disease in the chest, abdomen or pelvis. Stable. Will repeat scan in March 2024; order today. DEC 2023- TSH-Stable  # proceed with [place] maintainance today. Labs today reviewed;  acceptable for treatment today.     # Bone metastases- on zometa 3 mg IVPB.  Currently q 3 M. Ca-9.3.  Patient's last last Zometa- last 12/25/2021.  s/p dental extractions [[facility], Dr.Parks]. Continue multivitamin with calcium plus vitamin D 3 times a day.  S/p jan 19th, 2023. HOLD zometa;stable   # Hypothyroidism- [ Mid sep 2022]  Normal T3-T4.  Currently on 50 mcg a day; dec 2023- WNL-stable; vit D 25 OH;   # COPD-. On 2-3 lit O2; Trelegy.Clinically- stable    HOLD ZOMETA- last 12/25/2021-q 31M;   # DISPOSITION:  # Keytruda today # follow up in 3 weeks-MD;  port/labs- cbc/cmp; vit D 25 OH; Keytruda; CT CAP -Dr.B

## 2022-09-24 NOTE — Patient Instructions (Signed)
[facility]  Discharge Instructions: Thank you for choosing [facility] to provide your oncology and hematology care.  If you have a lab appointment with the [facility], please go directly to the [facility] and check in at the registration area.  Wear comfortable clothing and clothing appropriate for easy access to any Portacath or PICC line.   We strive to give you quality time with your provider. You may need to reschedule your appointment if you arrive late (15 or more minutes).  Arriving late affects you and other patients whose appointments are after yours.  Also, if you miss three or more appointments without notifying the office, you may be dismissed from the clinic at the provider's discretion.      For prescription refill requests, have your pharmacy contact our office and allow 72 hours for refills to be completed.    Today you received the following chemotherapy and/or immunotherapy agents KEYTRUDA      To help prevent nausea and vomiting after your treatment, we encourage you to take your nausea medication as directed.  BELOW ARE SYMPTOMS THAT SHOULD BE REPORTED IMMEDIATELY: *FEVER GREATER THAN 100.4 F (38 C) OR HIGHER *CHILLS OR SWEATING *NAUSEA AND VOMITING THAT IS NOT CONTROLLED WITH YOUR NAUSEA MEDICATION *UNUSUAL SHORTNESS OF BREATH *UNUSUAL BRUISING OR BLEEDING *URINARY PROBLEMS (pain or burning when urinating, or frequent urination) *BOWEL PROBLEMS (unusual diarrhea, constipation, pain near the anus) TENDERNESS IN MOUTH AND THROAT WITH OR WITHOUT PRESENCE OF ULCERS (sore throat, sores in mouth, or a toothache) UNUSUAL RASH, SWELLING OR PAIN  UNUSUAL VAGINAL DISCHARGE OR ITCHING   Items with * indicate a potential emergency and should be followed up as soon as possible or go to the Emergency Department if any problems should occur.  Please show the CHEMOTHERAPY ALERT CARD or IMMUNOTHERAPY ALERT CARD at check-in to  the Emergency Department and triage nurse.  Should you have questions after your visit or need to cancel or reschedule your appointment, please contact [facility]  [phone] and follow the prompts.  Office hours are 8:00 a.m. to 4:30 p.m. Monday - Friday. Please note that voicemails left after 4:00 p.m. may not be returned until the following business day.  We are closed weekends and major holidays. You have access to a nurse at all times for urgent questions. Please call the main number to the clinic [phone] and follow the prompts.  For any non-urgent questions, you may also contact your provider using MyChart. We now offer e-Visits for anyone 62 and older to request care online for non-urgent symptoms. For details visit [email or web address].   Also download the MyChart app! Go to the app store, search "MyChart", open the app, select [facility], and log in with your MyChart username and password.  Pembrolizumab Injection What is this medication? PEMBROLIZUMAB (PEM broe LIZ ue mab) treats some types of cancer. It works by helping your immune system slow or stop the spread of cancer cells. It is a monoclonal antibody. This medicine may be used for other purposes; ask your health care provider or pharmacist if you have questions. COMMON BRAND NAME(S): Keytruda What should I tell my care team before I take this medication? They need to know if you have any of these conditions: Allogeneic stem cell transplant (uses someone else's stem cells) Autoimmune diseases, such as Crohn disease, ulcerative colitis, lupus History of chest radiation Nervous system problems, such as Guillain-Barre syndrome, myasthenia gravis  Organ transplant An unusual or allergic reaction to pembrolizumab, other medications, foods, dyes, or preservatives Pregnant or trying to get pregnant Breast-feeding How should I use this medication? This medication is injected into a vein. It  is given by your care team in a hospital or clinic setting. A special MedGuide will be given to you before each treatment. Be sure to read this information carefully each time. Talk to your care team about the use of this medication in children. While it may be prescribed for children as young as 6 months for selected conditions, precautions do apply. Overdosage: If you think you have taken too much of this medicine contact a poison control center or emergency room at once. NOTE: This medicine is only for you. Do not share this medicine with others. What if I miss a dose? Keep appointments for follow-up doses. It is important not to miss your dose. Call your care team if you are unable to keep an appointment. What may interact with this medication? Interactions have not been studied. This list may not describe all possible interactions. Give your health care provider a list of all the medicines, herbs, non-prescription drugs, or dietary supplements you use. Also tell them if you smoke, drink alcohol, or use illegal drugs. Some items may interact with your medicine. What should I watch for while using this medication? Your condition will be monitored carefully while you are receiving this medication. You may need blood work while taking this medication. This medication may cause serious skin reactions. They can happen weeks to months after starting the medication. Contact your care team right away if you notice fevers or flu-like symptoms with a rash. The rash may be red or purple and then turn into blisters or peeling of the skin. You may also notice a red rash with swelling of the face, lips, or lymph nodes in your neck or under your arms. Tell your care team right away if you have any change in your eyesight. Talk to your care team if you may be pregnant. Serious birth defects can occur if you take this medication during pregnancy and for 4 months after the last dose. You will need a negative  pregnancy test before starting this medication. Contraception is recommended while taking this medication and for 4 months after the last dose. Your care team can help you find the option that works for you. Do not breastfeed while taking this medication and for 4 months after the last dose. What side effects may I notice from receiving this medication? Side effects that you should report to your care team as soon as possible: Allergic reactions--skin rash, itching, hives, swelling of the face, lips, tongue, or throat Dry cough, shortness of breath or trouble breathing Eye pain, redness, irritation, or discharge with blurry or decreased vision Heart muscle inflammation--unusual weakness or fatigue, shortness of breath, chest pain, fast or irregular heartbeat, dizziness, swelling of the ankles, feet, or hands Hormone gland problems--headache, sensitivity to light, unusual weakness or fatigue, dizziness, fast or irregular heartbeat, increased sensitivity to cold or heat, excessive sweating, constipation, hair loss, increased thirst or amount of urine, tremors or shaking, irritability Infusion reactions--chest pain, shortness of breath or trouble breathing, feeling faint or lightheaded Kidney injury (glomerulonephritis)--decrease in the amount of urine, red or dark brown urine, foamy or bubbly urine, swelling of the ankles, hands, or feet Liver injury--right upper belly pain, loss of appetite, nausea, light-colored stool, dark yellow or brown urine, yellowing skin or eyes, unusual weakness  or fatigue Pain, tingling, or numbness in the hands or feet, muscle weakness, change in vision, confusion or trouble speaking, loss of balance or coordination, trouble walking, seizures Rash, fever, and swollen lymph nodes Redness, blistering, peeling, or loosening of the skin, including inside the mouth Sudden or severe stomach pain, bloody diarrhea, fever, nausea, vomiting Side effects that usually do not require  medical attention (report to your care team if they continue or are bothersome): Bone, joint, or muscle pain Diarrhea Fatigue Loss of appetite Nausea Skin rash This list may not describe all possible side effects. Call your doctor for medical advice about side effects. You may report side effects to FDA at 1-800-FDA-1088. Where should I keep my medication? This medication is given in a hospital or clinic. It will not be stored at home. NOTE: This sheet is a summary. It may not cover all possible information. If you have questions about this medicine, talk to your doctor, pharmacist, or health care provider.  2023 Elsevier/Gold Standard (2013-04-04 00:00:00)    \[id number]\

## 2022-09-24 NOTE — Progress Notes (Signed)
Pt in for follow up, denies any concerns or difficulties today. 

## 2022-09-24 NOTE — Progress Notes (Signed)
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   # 2021- MAY 26th-Keytrda x2 cycles; [borderline PS] July 7th-carbo Alimta Keytruda cycle #1  #November 2022-oligometastatic progression-right upper lobe subpleural involvement with; s/p radiation finished December 15.  Temporarily held [facility].  #Jan fourth 2023-restarted Keytruda.  # NGS/MOLECULAR TESTS:   # PALLIATIVE CARE EVALUATION:  # PAIN MANAGEMENT:    DIAGNOSIS:   STAGE:         ;  GOALS:  CURRENT/MOST RECENT THERAPY :     Cancer of upper lobe of right lung ([facility])  11/23/2019 Initial Diagnosis   Cancer of upper lobe of right lung ([facility])   12/16/2019 -  Chemotherapy   Patient is on Treatment Plan : LUNG Carboplatin (5) + Pemetrexed (500) + Pembrolizumab (200) D1 q21d Induction x 4 cycles / Maintenance Pemetrexed (500) + Pembrolizumab (200) D1 q21d     12/21/2019 - 02/26/2022 Chemotherapy   Patient is on Treatment Plan : LUNG CARBOplatin / Pemetrexed / Pembrolizumab q21d Induction x 4 cycles / Maintenance Pemetrexed + Pembrolizumab      HISTORY OF PRESENTING ILLNESS: Thin built frail appearing male patient.  No acute distress. Alone.  Nasal cannula oxygen.  Ambulating independently.    Cody [name] 67 y.o.  male patient  with metastatic non-small cell lung cancer; cord compression status post decompressive surgery; currently on Keytruda maintenance-is here for follow-up.  Appetite is almost normal. Denies pain. Denies any dizziness or Dyspnea with any exertion. Oxygen dependent.   Some fatigue. Pt is able to due all his activities of daily living   Review of Systems  Constitutional:  Positive for malaise/fatigue. Negative for chills, diaphoresis and fever.  HENT:  Negative for nosebleeds and sore throat.   Eyes:  Negative for double vision.  Respiratory:  Positive for shortness of breath. Negative for cough, hemoptysis, sputum production and wheezing.   Cardiovascular:  Negative for chest pain, palpitations, orthopnea and leg swelling.  Gastrointestinal:  Negative for abdominal pain, blood in stool, constipation, heartburn, melena, nausea and vomiting.  Genitourinary:  Negative for dysuria, frequency and urgency.  Musculoskeletal:  Positive for back pain and joint pain.  Skin: Negative.  Negative for itching and rash.  Neurological:  Negative for dizziness, tingling, focal weakness, weakness and headaches.  Endo/Heme/Allergies:  Does not bruise/bleed easily.  Psychiatric/Behavioral:  Negative for depression. The patient is not nervous/anxious and does not have insomnia.      MEDICAL HISTORY:  Past Medical History:  Diagnosis Date   Allergy    Cancer of upper lobe of right lung ([facility]) 10/2019   COPD (chronic obstructive pulmonary disease) (HCC)    Prostate enlargement     SURGICAL HISTORY: Past Surgical History:  Procedure Laterality Date   HERNIA REPAIR Bilateral 1982   HERNIA REPAIR Right 1994   IR IMAGING GUIDED PORT INSERTION  02/24/2020   LAMINECTOMY FOR EXCISION / EVACUATION  INTRASPINAL LESION  11/07/2019   T1-T 6 at [facility]     SOCIAL HISTORY: Social History   Socioeconomic History   Marital status: Married    Spouse name: [name]    Number of children: 4   Years of education: Not on  file   Highest education level: Some college, no degree  Occupational History   Occupation: dispatch and delivery   Tobacco Use   Smoking status: Former    Packs/day: 2.00    Years: 30.00    Total pack years: 60.00    Types: Cigarettes    Quit date: 07/29/2011    Years since quitting: 11.1   Smokeless tobacco: Never   Tobacco comments:    pt vapes  Vaping Use   Vaping Use: Not on file  Substance and Sexual Activity   Alcohol use: Yes    Alcohol/week: 5.0 standard drinks of alcohol    Types: 5 Standard drinks or equivalent per week    Comment: 1-2/day   Drug use: No   Sexual activity: Not Currently    Partners: Female  Other Topics Concern   Not on file  Social History Narrative   Worked in warehouse/ quit smoking in 2015; beer 1/day; in [place]; with wife.    Social Determinants of Health   Financial Resource Strain: Low Risk  (10/21/2019)   Overall Financial Resource Strain (CARDIA)    Difficulty of Paying Living Expenses: Not hard at all  Food Insecurity: No Food Insecurity (10/21/2019)   Hunger Vital Sign    Worried About Running Out of Food in the Last Year: Never true    Ran Out of Food in the Last Year: Never true  Transportation Needs: No Transportation Needs (10/21/2019)   PRAPARE - Hydrologist (Medical): No    Lack of Transportation (Non-Medical): No  Physical Activity: Insufficiently Active (11/25/2019)   Exercise Vital Sign    Days of Exercise per Week: 7 days    Minutes of Exercise per Session: 20 min  Stress: No Stress Concern Present (10/21/2019)   [place]    Feeling of Stress : Not at all  Social Connections: [place] (10/21/2019)   Social Connection and Isolation Panel [NHANES]    Frequency of Communication with Friends and Family: More than three times a week    Frequency of Social Gatherings with Friends and Family: More than three times a week     Attends Religious Services: More than 4 times per year    Active Member of [employer] or Organizations: Yes    Attends Music therapist: More than 4 times per year    Marital Status: Married  Human resources officer Violence: Not At Risk (10/21/2019)   Humiliation, Afraid, Rape, and Kick questionnaire    Fear of Current or Ex-Partner: No    Emotionally Abused: No    Physically Abused: No    Sexually Abused: No    FAMILY HISTORY: Family History  Problem Relation Age of Onset   Heart disease Father    CVA Mother    Lung cancer Sister    Lung cancer Brother     ALLERGIES:  has No Known Allergies.  MEDICATIONS:  Current Outpatient Medications  Medication Sig Dispense Refill   acetaminophen (TYLENOL) 500 MG tablet Take 500-1,000 mg by mouth every 6 (six) hours as needed for mild pain or fever.      albuterol (VENTOLIN HFA) 108 (90 Base) MCG/ACT  inhaler Inhale 2 puffs into the lungs every 4 (four) hours as needed for wheezing or shortness of breath. 18 g 0   Calcium Carb-Cholecalciferol (OYSTER SHELL CALCIUM) 500-400 MG-UNIT TABS Take 500 tablets by mouth.     escitalopram (LEXAPRO) 5 MG tablet TAKE ONE TABLET BY MOUTH EVERY MORNING 90 tablet 0   levothyroxine (SYNTHROID) 50 MCG tablet TAKE 1 TABLET BY MOUTH DAILY BEFORE BREAKFAST 90 tablet 2   lidocaine-prilocaine (EMLA) cream Apply 1 application topically as needed. Apply small amount to port site at least 1 hour prior to it being accessed, cover with plastic wrap 30 g 1   Multiple Vitamin (MULTIVITAMIN) tablet Take 1 tablet by mouth daily.     QUEtiapine (SEROQUEL) 25 MG tablet TAKE 1 TABLET BY MOUTH AT BEDTIME 90 tablet 1   No current facility-administered medications for this visit.   Facility-Administered Medications Ordered in Other Visits  Medication Dose Route Frequency Provider Last Rate Last Admin   heparin lock flush 100 unit/mL  500 Units Intracatheter Once PRN Cammie Sickle, MD       pembrolizumab  [facility]) 200 mg in sodium chloride 0.9 % 50 mL chemo infusion  200 mg Intravenous Once Charlaine Dalton R, MD       sodium chloride flush (NS) 0.9 % injection 10 mL  10 mL Intracatheter PRN Cammie Sickle, MD          .  PHYSICAL EXAMINATION: ECOG PERFORMANCE STATUS: 3 - Symptomatic, >50% confined to bed  Vitals:   09/24/22 0952  BP: (!) 141/90  Pulse: 87  Resp: 20  Temp: (!) 97.5 F (36.4 C)  SpO2: 100%   Filed Weights   09/24/22 0952  Weight: 126 lb 3.2 oz (57.2 kg)     Physical Exam HENT:     Head: Normocephalic and atraumatic.     Mouth/Throat:     Pharynx: No oropharyngeal exudate.  Eyes:     Pupils: Pupils are equal, round, and reactive to light.  Cardiovascular:     Rate and Rhythm: Normal rate and regular rhythm.  Pulmonary:     Effort: No respiratory distress.     Breath sounds: No wheezing.     Comments: Decreased air entry bilaterally. Abdominal:     General: Bowel sounds are normal. There is no distension.     Palpations: Abdomen is soft. There is no mass.     Tenderness: There is no abdominal tenderness. There is no guarding or rebound.  Musculoskeletal:        General: No tenderness. Normal range of motion.     Cervical back: Normal range of motion and neck supple.  Skin:    General: Skin is warm.  Neurological:     Mental Status: He is alert and oriented to person, place, and time.  Psychiatric:        Mood and Affect: Affect normal.      LABORATORY DATA:  I have reviewed the data as listed Lab Results  Component Value Date   WBC 6.0 09/24/2022   HGB 13.6 09/24/2022   HCT 42.2 09/24/2022   MCV 96.6 09/24/2022   PLT 165 09/24/2022   Recent Labs    08/13/22 1233 09/03/22 0930 09/24/22 0921  NA 137 139 139  K 4.1 4.1 3.9  CL 92* 90* 91*  CO2 38* 40* 38*  GLUCOSE 108* 106* 103*  BUN '9 8 10  '$ CREATININE 0.58* 0.60* 0.59*  CALCIUM 8.6* 9.2 9.3  GFRNONAA >60 >60 >60  PROT 7.2 7.3 7.8  ALBUMIN 3.9 4.1 4.2  AST '20 23 22   '$ ALT '13 15 14  '$ ALKPHOS 54 59 56  BILITOT 0.3 0.4 0.5    RADIOGRAPHIC STUDIES: I have personally reviewed the radiological images as listed and agreed with the findings in the report. No results found.   ASSESSMENT & PLAN:   Cancer of upper lobe of right lung ([facility]) # Stage IV metastatic non-small cell lung cancer favor adeno; mets to bone; liver.   Oligo-metastatic progression-s/p RT right upper lobe mass [s/p dec 15th, 2022].  Currently on Keytruda maintenance.   # DEC 8th 2023- CT CAP- No evidence of local tumor recurrence;  Stable sclerotic osseous metastases; NO new or progressive metastatic disease in the chest, abdomen or pelvis. Stable. Will repeat scan in March 2024; order today. DEC 2023- TSH-Stable  # proceed with [place] maintainance today. Labs today reviewed;  acceptable for treatment today.     # Bone metastases- on zometa 3 mg IVPB.  Currently q 3 M. Ca-9.3.  Patient's last last Zometa- last 12/25/2021.  s/p dental extractions [[facility], Dr.Parks]. Continue multivitamin with calcium plus vitamin D 3 times a day.  S/p jan 19th, 2023. HOLD zometa;stable   # Hypothyroidism- [ Mid sep 2022]  Normal T3-T4.  Currently on 50 mcg a day; dec 2023- WNL-stable; vit D 25 OH;   # COPD-. On 2-3 lit O2; Trelegy.Clinically- stable    HOLD ZOMETA- last 12/25/2021-q 16M;   # DISPOSITION:  # Keytruda today # follow up in 3 weeks-MD;  port/labs- cbc/cmp; vit D 25 OH; Keytruda; CT CAP -Dr.B   All questions were answered. The patient knows to call the clinic with any problems, questions or concerns.    Cammie Sickle, MD 09/24/2022 10:37 AM

## 2022-09-30 DIAGNOSIS — J9601 Acute respiratory failure with hypoxia: Secondary | ICD-10-CM | POA: Diagnosis not present

## 2022-09-30 DIAGNOSIS — J441 Chronic obstructive pulmonary disease with (acute) exacerbation: Secondary | ICD-10-CM | POA: Diagnosis not present

## 2022-10-08 ENCOUNTER — Telehealth: Payer: Self-pay | Admitting: *Deleted

## 2022-10-08 NOTE — Patient Outreach (Signed)
  Care Coordination   Initial Visit Note   10/08/2022 Name: Cody [name] MRN: [id number] DOB: 09-12-1955  Cody [name] is a 67 y.o. year old male who sees Cody Sizer, MD for primary care. I spoke with  Cody [name] by phone today.   SDOH assessments and interventions completed:  Yes  SDOH Interventions Today    Flowsheet Row Most Recent Value  SDOH Interventions   Food Insecurity Interventions Intervention Not Indicated  Housing Interventions Intervention Not Indicated  Transportation Interventions Intervention Not Indicated        Care Coordination Interventions:  Yes, provided   Follow up plan: Care coordination program/ services discussed .Social determinants of health survey completed,Patient advised to contact primary care provider office  if care coordination services needed in the future.   Encounter Outcome:  Pt. Visit Completed   Cody [name] Management [phone]

## 2022-10-10 ENCOUNTER — Ambulatory Visit
Admission: RE | Admit: 2022-10-10 | Discharge: 2022-10-10 | Disposition: A | Discharge status: Home / Self Care | Payer: Medicare HMO | Source: Ambulatory Visit | Attending: Internal Medicine | Admitting: Internal Medicine

## 2022-10-10 DIAGNOSIS — C3411 Malignant neoplasm of upper lobe, right bronchus or lung: Secondary | ICD-10-CM | POA: Diagnosis not present

## 2022-10-10 DIAGNOSIS — C3490 Malignant neoplasm of unspecified part of unspecified bronchus or lung: Secondary | ICD-10-CM | POA: Diagnosis not present

## 2022-10-10 MED ORDER — IOHEXOL 300 MG/ML  SOLN
100.0000 mL | Freq: Once | INTRAMUSCULAR | Status: AC | PRN
  Administered 2022-10-10: 100 mL via INTRAVENOUS

## 2022-10-15 ENCOUNTER — Inpatient Hospital Stay: Payer: Medicare HMO

## 2022-10-15 ENCOUNTER — Inpatient Hospital Stay: Payer: Medicare HMO | Attending: Internal Medicine | Admitting: Internal Medicine

## 2022-10-15 ENCOUNTER — Encounter: Payer: Self-pay | Admitting: Internal Medicine

## 2022-10-15 VITALS — BP 154/81 | HR 80 | Temp 96.7°F | Resp 18 | Ht 69.0 in | Wt 125.8 lb

## 2022-10-15 DIAGNOSIS — C7951 Secondary malignant neoplasm of bone: Secondary | ICD-10-CM | POA: Insufficient documentation

## 2022-10-15 DIAGNOSIS — E039 Hypothyroidism, unspecified: Secondary | ICD-10-CM | POA: Diagnosis not present

## 2022-10-15 DIAGNOSIS — R54 Age-related physical debility: Secondary | ICD-10-CM | POA: Diagnosis not present

## 2022-10-15 DIAGNOSIS — C3411 Malignant neoplasm of upper lobe, right bronchus or lung: Secondary | ICD-10-CM

## 2022-10-15 DIAGNOSIS — I700 Atherosclerosis of aorta: Secondary | ICD-10-CM | POA: Diagnosis not present

## 2022-10-15 DIAGNOSIS — Z9981 Dependence on supplemental oxygen: Secondary | ICD-10-CM | POA: Diagnosis not present

## 2022-10-15 DIAGNOSIS — D1771 Benign lipomatous neoplasm of kidney: Secondary | ICD-10-CM | POA: Insufficient documentation

## 2022-10-15 DIAGNOSIS — Z79899 Other long term (current) drug therapy: Secondary | ICD-10-CM | POA: Diagnosis not present

## 2022-10-15 DIAGNOSIS — J449 Chronic obstructive pulmonary disease, unspecified: Secondary | ICD-10-CM | POA: Diagnosis not present

## 2022-10-15 DIAGNOSIS — M549 Dorsalgia, unspecified: Secondary | ICD-10-CM | POA: Insufficient documentation

## 2022-10-15 DIAGNOSIS — Z7189 Other specified counseling: Secondary | ICD-10-CM

## 2022-10-15 DIAGNOSIS — C787 Secondary malignant neoplasm of liver and intrahepatic bile duct: Secondary | ICD-10-CM | POA: Insufficient documentation

## 2022-10-15 DIAGNOSIS — R0602 Shortness of breath: Secondary | ICD-10-CM | POA: Diagnosis not present

## 2022-10-15 DIAGNOSIS — Z801 Family history of malignant neoplasm of trachea, bronchus and lung: Secondary | ICD-10-CM | POA: Insufficient documentation

## 2022-10-15 DIAGNOSIS — Z923 Personal history of irradiation: Secondary | ICD-10-CM | POA: Insufficient documentation

## 2022-10-15 DIAGNOSIS — N400 Enlarged prostate without lower urinary tract symptoms: Secondary | ICD-10-CM | POA: Insufficient documentation

## 2022-10-15 DIAGNOSIS — Z7983 Long term (current) use of bisphosphonates: Secondary | ICD-10-CM | POA: Diagnosis not present

## 2022-10-15 DIAGNOSIS — Z79620 Long term (current) use of immunosuppressive biologic: Secondary | ICD-10-CM | POA: Diagnosis not present

## 2022-10-15 DIAGNOSIS — Z823 Family history of stroke: Secondary | ICD-10-CM | POA: Insufficient documentation

## 2022-10-15 DIAGNOSIS — J439 Emphysema, unspecified: Secondary | ICD-10-CM | POA: Insufficient documentation

## 2022-10-15 DIAGNOSIS — Z8249 Family history of ischemic heart disease and other diseases of the circulatory system: Secondary | ICD-10-CM | POA: Insufficient documentation

## 2022-10-15 DIAGNOSIS — Z5112 Encounter for antineoplastic immunotherapy: Secondary | ICD-10-CM | POA: Insufficient documentation

## 2022-10-15 DIAGNOSIS — M2550 Pain in unspecified joint: Secondary | ICD-10-CM | POA: Insufficient documentation

## 2022-10-15 LAB — COMPREHENSIVE METABOLIC PANEL
ALT: 13 U/L (ref 0–44)
AST: 21 U/L (ref 15–41)
Albumin: 3.8 g/dL (ref 3.5–5.0)
Alkaline Phosphatase: 49 U/L (ref 38–126)
Anion gap: 8 (ref 5–15)
BUN: 9 mg/dL (ref 8–23)
CO2: 39 mmol/L — ABNORMAL HIGH (ref 22–32)
Calcium: 8.8 mg/dL — ABNORMAL LOW (ref 8.9–10.3)
Chloride: 92 mmol/L — ABNORMAL LOW (ref 98–111)
Creatinine, Ser: 0.59 mg/dL — ABNORMAL LOW (ref 0.61–1.24)
GFR, Estimated: >60 mL/min (ref >60)
Glucose, Bld: 97 mg/dL (ref 70–99)
Potassium: 3.6 mmol/L (ref 3.5–5.1)
Sodium: 139 mmol/L (ref 135–145)
Total Bilirubin: 0.5 mg/dL (ref 0.3–1.2)
Total Protein: 7 g/dL (ref 6.5–8.1)

## 2022-10-15 LAB — CBC WITH DIFFERENTIAL/PLATELET
Abs Immature Granulocytes: 0.01 10*3/uL (ref 0.00–0.07)
Basophils Absolute: 0 10*3/uL (ref 0.0–0.1)
Basophils Relative: 1 %
Eosinophils Absolute: 0.7 10*3/uL — ABNORMAL HIGH (ref 0.0–0.5)
Eosinophils Relative: 11 %
HCT: 41.5 % (ref 39.0–52.0)
Hemoglobin: 13.1 g/dL (ref 13.0–17.0)
Immature Granulocytes: 0 %
Lymphocytes Relative: 22 %
Lymphs Abs: 1.3 10*3/uL (ref 0.7–4.0)
MCH: 30.7 pg (ref 26.0–34.0)
MCHC: 31.6 g/dL (ref 30.0–36.0)
MCV: 97.2 fL (ref 80.0–100.0)
Monocytes Absolute: 0.6 10*3/uL (ref 0.1–1.0)
Monocytes Relative: 10 %
Neutro Abs: 3.3 10*3/uL (ref 1.7–7.7)
Neutrophils Relative %: 56 %
Platelets: 162 10*3/uL (ref 150–400)
RBC: 4.27 MIL/uL (ref 4.22–5.81)
RDW: 12.1 % (ref 11.5–15.5)
WBC: 5.9 10*3/uL (ref 4.0–10.5)
nRBC: 0 % (ref 0.0–0.2)

## 2022-10-15 LAB — VITAMIN D 25 HYDROXY (VIT D DEFICIENCY, FRACTURES): Vit D, 25-Hydroxy: 70.02 ng/mL (ref 30–100)

## 2022-10-15 MED ORDER — HEPARIN SOD (PORK) LOCK FLUSH 100 UNIT/ML IV SOLN
500.0000 [IU] | Freq: Once | INTRAVENOUS | Status: AC | PRN
  Administered 2022-10-15: 500 [IU]
  Filled 2022-10-15: qty 5

## 2022-10-15 MED ORDER — SODIUM CHLORIDE 0.9 % IV SOLN
Freq: Once | INTRAVENOUS | Status: AC
  Filled 2022-10-15: qty 250

## 2022-10-15 MED ORDER — SODIUM CHLORIDE 0.9 % IV SOLN
200.0000 mg | Freq: Once | INTRAVENOUS | Status: AC
  Administered 2022-10-15: 200 mg via INTRAVENOUS
  Filled 2022-10-15: qty 8

## 2022-10-15 MED ORDER — HEPARIN SOD (PORK) LOCK FLUSH 100 UNIT/ML IV SOLN
INTRAVENOUS | Status: AC
  Filled 2022-10-15: qty 5

## 2022-10-15 NOTE — Progress Notes (Signed)
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   # 2021- MAY 26th-Keytrda x2 cycles; [borderline PS] July 7th-carbo Alimta Keytruda cycle #1  #November 2022-oligometastatic progression-right upper lobe subpleural involvement with; s/p radiation finished December 15.  Temporarily held [place].  #Jan fourth 2023-restarted Keytruda.  # NGS/MOLECULAR TESTS:   # PALLIATIVE CARE EVALUATION:  # PAIN MANAGEMENT:    DIAGNOSIS:   STAGE:         ;  GOALS:  CURRENT/MOST RECENT THERAPY :     Cancer of upper lobe of right lung ([place])  11/23/2019 Initial Diagnosis   Cancer of upper lobe of right lung ([place])   12/16/2019 -  Chemotherapy   Patient is on Treatment Plan : LUNG Carboplatin (5) + Pemetrexed (500) + Pembrolizumab (200) D1 q21d Induction x 4 cycles / Maintenance Pemetrexed (500) + Pembrolizumab (200) D1 q21d     12/21/2019 - 02/26/2022 Chemotherapy   Patient is on Treatment Plan : LUNG CARBOplatin / Pemetrexed / Pembrolizumab q21d Induction x 4 cycles / Maintenance Pemetrexed + Pembrolizumab      HISTORY OF PRESENTING ILLNESS: Thin built frail appearing male patient.  No acute distress. Alone.  Nasal cannula oxygen.  Ambulating independently.    Cody [name] 67 y.o.  male patient  with metastatic non-small cell lung cancer; cord compression status post decompressive surgery; currently on Keytruda maintenance-is here for follow-up/ CT scan.  Appetite is almost normal. Denies pain. Denies any dizziness or Dyspnea with any exertion. Oxygen dependent. Some fatigue. Pt is able to due all his activities of daily living   Review of Systems  Constitutional:  Positive for malaise/fatigue. Negative for chills, diaphoresis and fever.  HENT:  Negative for nosebleeds and sore throat.   Eyes:  Negative for double vision.  Respiratory:  Positive for shortness of breath. Negative for cough, hemoptysis, sputum production and wheezing.   Cardiovascular:  Negative for chest pain, palpitations, orthopnea and leg swelling.  Gastrointestinal:  Negative for abdominal pain, blood in stool, constipation, heartburn, melena, nausea and vomiting.  Genitourinary:  Negative for dysuria, frequency and urgency.  Musculoskeletal:  Positive for back pain and joint pain.  Skin: Negative.  Negative for itching and rash.  Neurological:  Negative for dizziness, tingling, focal weakness, weakness and headaches.  Endo/Heme/Allergies:  Does not bruise/bleed easily.  Psychiatric/Behavioral:  Negative for depression. The patient is not nervous/anxious and does not have insomnia.      MEDICAL HISTORY:  Past Medical History:  Diagnosis Date   Allergy    Cancer of upper lobe of right lung ([place]) 10/2019   COPD (chronic obstructive pulmonary disease) (HCC)    Prostate enlargement     SURGICAL HISTORY: Past Surgical History:  Procedure Laterality Date   HERNIA REPAIR Bilateral 1982   HERNIA REPAIR Right 1994   IR IMAGING GUIDED PORT INSERTION  02/24/2020   LAMINECTOMY FOR EXCISION / EVACUATION  INTRASPINAL LESION  11/07/2019   T1-T 6 at [facility]     SOCIAL HISTORY: Social History   Socioeconomic History   Marital status: Married    Spouse name: [name]    Number of children: 4   Years of education: Not  on file   Highest education level: Some college, no degree  Occupational History   Occupation: dispatch and delivery   Tobacco Use   Smoking status: Former    Packs/day: 2.00    Years: 30.00    Additional pack years: 0.00    Total pack years: 60.00    Types: Cigarettes    Quit date: 07/29/2011    Years since quitting: 11.2   Smokeless tobacco: Never   Tobacco comments:    pt vapes  Vaping Use   Vaping Use: Not on file  Substance and Sexual Activity   Alcohol use: Yes    Alcohol/week: 5.0 standard drinks of alcohol    Types: 5 Standard drinks or equivalent per week    Comment: 1-2/day   Drug use: No   Sexual activity: Not Currently    Partners: Female  Other Topics Concern   Not on file  Social History Narrative   Worked in warehouse/ quit smoking in 2015; beer 1/day; in [place]; with wife.    Social Determinants of Health   Financial Resource Strain: Low Risk  (10/21/2019)   Overall Financial Resource Strain (CARDIA)    Difficulty of Paying Living Expenses: Not hard at all  Food Insecurity: No Food Insecurity (10/08/2022)   Hunger Vital Sign    Worried About Running Out of Food in the Last Year: Never true    Ran Out of Food in the Last Year: Never true  Transportation Needs: No Transportation Needs (10/08/2022)   PRAPARE - Hydrologist (Medical): No    Lack of Transportation (Non-Medical): No  Physical Activity: Insufficiently Active (11/25/2019)   Exercise Vital Sign    Days of Exercise per Week: 7 days    Minutes of Exercise per Session: 20 min  Stress: No Stress Concern Present (10/21/2019)   [place]    Feeling of Stress : Not at all  Social Connections: [place] (10/21/2019)   Social Connection and Isolation Panel [NHANES]    Frequency of Communication with Friends and Family: More than three times a week    Frequency of Social Gatherings with Friends and  Family: More than three times a week    Attends Religious Services: More than 4 times per year    Active Member of [employer] or Organizations: Yes    Attends Music therapist: More than 4 times per year    Marital Status: Married  Human resources officer Violence: Not At Risk (10/21/2019)   Humiliation, Afraid, Rape, and Kick questionnaire    Fear of Current or Ex-Partner: No    Emotionally Abused: No    Physically Abused: No    Sexually Abused: No    FAMILY HISTORY: Family History  Problem Relation Age of Onset   Heart disease Father    CVA Mother    Lung cancer Sister    Lung cancer Brother     ALLERGIES:  has No Known Allergies.  MEDICATIONS:  Current Outpatient Medications  Medication Sig Dispense Refill   acetaminophen (TYLENOL) 500 MG tablet Take 500-1,000 mg by mouth every 6 (six) hours as needed for mild pain or fever.  albuterol (VENTOLIN HFA) 108 (90 Base) MCG/ACT inhaler Inhale 2 puffs into the lungs every 4 (four) hours as needed for wheezing or shortness of breath. 18 g 0   Calcium Carb-Cholecalciferol (OYSTER SHELL CALCIUM) 500-400 MG-UNIT TABS Take 500 tablets by mouth.     escitalopram (LEXAPRO) 5 MG tablet TAKE ONE TABLET BY MOUTH EVERY MORNING 90 tablet 0   levothyroxine (SYNTHROID) 50 MCG tablet TAKE 1 TABLET BY MOUTH DAILY BEFORE BREAKFAST 90 tablet 2   lidocaine-prilocaine (EMLA) cream Apply 1 application topically as needed. Apply small amount to port site at least 1 hour prior to it being accessed, cover with plastic wrap 30 g 1   Multiple Vitamin (MULTIVITAMIN) tablet Take 1 tablet by mouth daily.     QUEtiapine (SEROQUEL) 25 MG tablet TAKE 1 TABLET BY MOUTH AT BEDTIME 90 tablet 1   No current facility-administered medications for this visit.   Facility-Administered Medications Ordered in Other Visits  Medication Dose Route Frequency Provider Last Rate Last Admin   heparin lock flush 100 unit/mL  500 Units Intracatheter Once PRN Cammie Sickle, MD       pembrolizumab [facility]) 200 mg in sodium chloride 0.9 % 50 mL chemo infusion  200 mg Intravenous Once Cammie Sickle, MD          .  PHYSICAL EXAMINATION: ECOG PERFORMANCE STATUS: 3 - Symptomatic, >50% confined to bed  Vitals:   10/15/22 0800  BP: (!) 154/81  Pulse: 80  Resp: 18  Temp: (!) 96.7 F (35.9 C)  SpO2: 98%   Filed Weights   10/15/22 0800  Weight: 125 lb 12.8 oz (57.1 kg)     Physical Exam HENT:     Head: Normocephalic and atraumatic.     Mouth/Throat:     Pharynx: No oropharyngeal exudate.  Eyes:     Pupils: Pupils are equal, round, and reactive to light.  Cardiovascular:     Rate and Rhythm: Normal rate and regular rhythm.  Pulmonary:     Effort: No respiratory distress.     Breath sounds: No wheezing.     Comments: Decreased air entry bilaterally. Abdominal:     General: Bowel sounds are normal. There is no distension.     Palpations: Abdomen is soft. There is no mass.     Tenderness: There is no abdominal tenderness. There is no guarding or rebound.  Musculoskeletal:        General: No tenderness. Normal range of motion.     Cervical back: Normal range of motion and neck supple.  Skin:    General: Skin is warm.  Neurological:     Mental Status: He is alert and oriented to person, place, and time.  Psychiatric:        Mood and Affect: Affect normal.      LABORATORY DATA:  I have reviewed the data as listed Lab Results  Component Value Date   WBC 5.9 10/15/2022   HGB 13.1 10/15/2022   HCT 41.5 10/15/2022   MCV 97.2 10/15/2022   PLT 162 10/15/2022   Recent Labs    09/03/22 0930 09/24/22 0921 10/15/22 0814  NA 139 139 139  K 4.1 3.9 3.6  CL 90* 91* 92*  CO2 40* 38* 39*  GLUCOSE 106* 103* 97  BUN 8 10 9   CREATININE 0.60* 0.59* 0.59*  CALCIUM 9.2 9.3 8.8*  GFRNONAA >60 >60 >60  PROT 7.3 7.8 7.0  ALBUMIN 4.1 4.2 3.8  AST 23 22 21   ALT  15 14 13   ALKPHOS 59 56 49  BILITOT 0.4 0.5 0.5    RADIOGRAPHIC  STUDIES: I have personally reviewed the radiological images as listed and agreed with the findings in the report. CT CHEST ABDOMEN PELVIS W CONTRAST  Result Date: 10/13/2022 CLINICAL DATA:  Non-small-cell lung cancer, metastatic. Evaluate treatment response. Right upper lobe lung cancer diagnosed in 2021 with right upper lobectomy. Chemotherapy and radiation therapy. Spine and liver metastasis with radiation therapy in 2022. Metastasis to right upper lobe subpleural space, status post radiation therapy in December. * Tracking Code: BO * EXAM: CT CHEST, ABDOMEN, AND PELVIS WITH CONTRAST TECHNIQUE: Multidetector CT imaging of the chest, abdomen and pelvis was performed following the standard protocol during bolus administration of intravenous contrast. RADIATION DOSE REDUCTION: This exam was performed according to the departmental dose-optimization program which includes automated exposure control, adjustment of the mA and/or kV according to patient size and/or use of iterative reconstruction technique. CONTRAST:  115mL OMNIPAQUE IOHEXOL 300 MG/ML  SOLN COMPARISON:  07/04/2022 FINDINGS: CT CHEST FINDINGS Cardiovascular: Right Port-A-Cath tip high right atrium. Aortic atherosclerosis. Normal heart size, without pericardial effusion. Lad coronary artery calcification. No central pulmonary embolism, on this non-dedicated study. Mediastinum/Nodes: No supraclavicular adenopathy. No axillary adenopathy. No mediastinal or hilar adenopathy. Lungs/Pleura: No pleural fluid. Advanced bullous emphysema. Right apical soft tissue fullness is similar and likely related to radiation induced scarring. This is similar in morphology, without well-defined local recurrence. Left apical calcified granuloma. Musculoskeletal: Similar appearance of sclerosis and expansion of posterior third through fifth right ribs. No new osseous abnormality. Status post T1 through T6 trans pedicle screw fixation with similar heterogeneity of and body  height loss at T3 and less so T4 consistent with treated metastasis CT ABDOMEN PELVIS FINDINGS Hepatobiliary: Normal liver. Normal gallbladder, without biliary ductal dilatation. Pancreas: Normal, without mass or ductal dilatation. Spleen: Normal in size, without focal abnormality. Adrenals/Urinary Tract: Normal adrenal glands. Interpolar left renal fat density 8 mm lesion is similar, consistent with a benign angiomyolipoma. Normal right kidney, without hydronephrosis. Normal urinary bladder. Stomach/Bowel: Normal stomach, without wall thickening. Scattered colonic diverticula. Colonic stool burden suggests constipation. Normal small bowel. Vascular/Lymphatic: Advanced aortic and branch vessel atherosclerosis. No abdominopelvic adenopathy. Reproductive: Mild prostatomegaly. Other: No significant free fluid. No evidence of omental or peritoneal disease. Musculoskeletal: Surgical changes in the right pelvis may represent prior inguinal hernia repair. An overlying low-density 2.1 cm subcutaneous lesion may be postoperative or represent an incidental sebaceous cyst. This is similar. Tiny left paramidline ventral abdominal wall hernia contains fat. No acute osseous abnormality. IMPRESSION: 1. Similar appearance of the superior right hemithorax. Presumably radiation induced apical scarring with surrounding sclerosis and enlargement of right posterior third through fifth ribs, likely treated metastasis. 2. Status post T1-6 trans pedicle screw fixation for treated metastasis at T3 and T4. 3. No new or progressive disease. 4. Incidental findings, including: Aortic atherosclerosis (ICD10-I70.0), coronary artery atherosclerosis and emphysema (ICD10-J43.9). Prostatomegaly. Possible constipation. 5. 8 mm benign left renal angiomyolipoma does not warrant imaging follow-up. Electronically Signed   By: Abigail Miyamoto M.D.   On: 10/13/2022 15:22     ASSESSMENT & PLAN:   Cancer of upper lobe of right lung ([place]) # Stage IV  metastatic non-small cell lung cancer favor adeno; mets to bone; liver.   Oligo-metastatic progression-s/p RT right upper lobe mass [s/p dec 15th, 2022].  Currently on Keytruda maintenance.   # MARCH 16th, 2024- CT CAP-scarring noted in the right upper thorax;  no evidence of  local tumor recurrence;  Stable sclerotic osseous metastases; NO new or progressive metastatic disease in the chest, abdomen or pelvis.  Stable.   # proceed with [place] maintainance today. Labs today reviewed;  acceptable for treatment today.     # Bone metastases- on zometa 3 mg IVPB.  Currently q 3 M. s/p dental extractions [[facility], Dr.Parks]. Continue multivitamin with calcium plus vitamin D 3 times a day.  S/p jan 19th, 2023. HOLD zometa- stable. Awaiting dental clearance. vit D 25 OH;   # Hypothyroidism- [ Mid sep 2022]  Normal T3-T4.  Currently on 50 mcg a day; dec 2023- WNL-stabke  # COPD-. On 2-3 lit O2; Trelegy.Clinically- stable    HOLD ZOMETA- last 12/25/2021-q 34M;   #Incidental findings on Imaging  CT ,MARCH  2024: Coronary calcification/atherosclerosis emphysema prostate enlargement possible constipation left renal angiomyolipoma - I reviewed/discussed/counseled the patient.   # DISPOSITION:  # Keytruda today # follow up in 3 weeks-MD;  port/labs- cbc/cmp;  Keytruda; -Dr.B  # I reviewed the blood work- with the patient in detail; also reviewed the imaging independently [as summarized above]; and with the patient in detail.    # I reviewed the blood work- with the patient in detail; also reviewed the imaging independently [as summarized above]; and with the patient in detail.    All questions were answered. The patient knows to call the clinic with any problems, questions or concerns.    Cammie Sickle, MD 10/15/2022 9:36 AM

## 2022-10-15 NOTE — Progress Notes (Signed)
Patient has completed dental work.  Stable lower extremity neuropathy.

## 2022-10-15 NOTE — Assessment & Plan Note (Addendum)
#   Stage IV metastatic non-small cell lung cancer favor adeno; mets to bone; liver.   Oligo-metastatic progression-s/p RT right upper lobe mass [s/p dec 15th, 2022].  Currently on Keytruda maintenance.   # MARCH 16th, 2024- CT CAP-scarring noted in the right upper thorax;  no evidence of local tumor recurrence;  Stable sclerotic osseous metastases; NO new or progressive metastatic disease in the chest, abdomen or pelvis.  Stable.   # proceed with [place] maintainance today. Labs today reviewed;  acceptable for treatment today.     # Bone metastases- on zometa 3 mg IVPB.  Currently q 3 M. s/p dental extractions [[facility], Dr.Parks]. Continue multivitamin with calcium plus vitamin D 3 times a day.  S/p jan 19th, 2023. HOLD zometa- stable. Awaiting dental clearance. vit D 25 OH;   # Hypothyroidism- [ Mid sep 2022]  Normal T3-T4.  Currently on 50 mcg a day; dec 2023- WNL-stabke  # COPD-. On 2-3 lit O2; Trelegy.Clinically- stable    HOLD ZOMETA- last 12/25/2021-q 43M;   #Incidental findings on Imaging  CT ,MARCH  2024: Coronary calcification/atherosclerosis emphysema prostate enlargement possible constipation left renal angiomyolipoma - I reviewed/discussed/counseled the patient.   # DISPOSITION:  # Keytruda today # follow up in 3 weeks-MD;  port/labs- cbc/cmp;  Keytruda; -Dr.B  # I reviewed the blood work- with the patient in detail; also reviewed the imaging independently [as summarized above]; and with the patient in detail.    # I reviewed the blood work- with the patient in detail; also reviewed the imaging independently [as summarized above]; and with the patient in detail.

## 2022-10-29 ENCOUNTER — Encounter: Payer: Self-pay | Admitting: Internal Medicine

## 2022-10-31 ENCOUNTER — Other Ambulatory Visit: Payer: Self-pay | Admitting: Internal Medicine

## 2022-10-31 DIAGNOSIS — F341 Dysthymic disorder: Secondary | ICD-10-CM

## 2022-10-31 DIAGNOSIS — F419 Anxiety disorder, unspecified: Secondary | ICD-10-CM

## 2022-10-31 DIAGNOSIS — J9601 Acute respiratory failure with hypoxia: Secondary | ICD-10-CM | POA: Diagnosis not present

## 2022-10-31 DIAGNOSIS — J441 Chronic obstructive pulmonary disease with (acute) exacerbation: Secondary | ICD-10-CM | POA: Diagnosis not present

## 2022-11-05 ENCOUNTER — Inpatient Hospital Stay: Payer: Medicare HMO | Attending: Internal Medicine | Admitting: Internal Medicine

## 2022-11-05 ENCOUNTER — Encounter: Payer: Self-pay | Admitting: Internal Medicine

## 2022-11-05 ENCOUNTER — Inpatient Hospital Stay: Payer: Medicare HMO

## 2022-11-05 VITALS — BP 146/89 | HR 81 | Temp 96.0°F | Resp 18 | Wt 128.0 lb

## 2022-11-05 DIAGNOSIS — N400 Enlarged prostate without lower urinary tract symptoms: Secondary | ICD-10-CM | POA: Insufficient documentation

## 2022-11-05 DIAGNOSIS — Z5112 Encounter for antineoplastic immunotherapy: Secondary | ICD-10-CM | POA: Diagnosis not present

## 2022-11-05 DIAGNOSIS — I700 Atherosclerosis of aorta: Secondary | ICD-10-CM | POA: Insufficient documentation

## 2022-11-05 DIAGNOSIS — C7801 Secondary malignant neoplasm of right lung: Secondary | ICD-10-CM | POA: Diagnosis not present

## 2022-11-05 DIAGNOSIS — C3411 Malignant neoplasm of upper lobe, right bronchus or lung: Secondary | ICD-10-CM

## 2022-11-05 DIAGNOSIS — F17290 Nicotine dependence, other tobacco product, uncomplicated: Secondary | ICD-10-CM | POA: Insufficient documentation

## 2022-11-05 DIAGNOSIS — Z9981 Dependence on supplemental oxygen: Secondary | ICD-10-CM | POA: Diagnosis not present

## 2022-11-05 DIAGNOSIS — M549 Dorsalgia, unspecified: Secondary | ICD-10-CM | POA: Diagnosis not present

## 2022-11-05 DIAGNOSIS — E039 Hypothyroidism, unspecified: Secondary | ICD-10-CM | POA: Diagnosis not present

## 2022-11-05 DIAGNOSIS — Z923 Personal history of irradiation: Secondary | ICD-10-CM | POA: Insufficient documentation

## 2022-11-05 DIAGNOSIS — Z801 Family history of malignant neoplasm of trachea, bronchus and lung: Secondary | ICD-10-CM | POA: Insufficient documentation

## 2022-11-05 DIAGNOSIS — Z79899 Other long term (current) drug therapy: Secondary | ICD-10-CM | POA: Diagnosis not present

## 2022-11-05 DIAGNOSIS — R5383 Other fatigue: Secondary | ICD-10-CM | POA: Insufficient documentation

## 2022-11-05 DIAGNOSIS — R0602 Shortness of breath: Secondary | ICD-10-CM | POA: Insufficient documentation

## 2022-11-05 DIAGNOSIS — R54 Age-related physical debility: Secondary | ICD-10-CM | POA: Diagnosis not present

## 2022-11-05 DIAGNOSIS — Z823 Family history of stroke: Secondary | ICD-10-CM | POA: Insufficient documentation

## 2022-11-05 DIAGNOSIS — Z87891 Personal history of nicotine dependence: Secondary | ICD-10-CM | POA: Diagnosis not present

## 2022-11-05 DIAGNOSIS — C787 Secondary malignant neoplasm of liver and intrahepatic bile duct: Secondary | ICD-10-CM | POA: Insufficient documentation

## 2022-11-05 DIAGNOSIS — Z7189 Other specified counseling: Secondary | ICD-10-CM

## 2022-11-05 DIAGNOSIS — M2550 Pain in unspecified joint: Secondary | ICD-10-CM | POA: Diagnosis not present

## 2022-11-05 DIAGNOSIS — Z79890 Hormone replacement therapy (postmenopausal): Secondary | ICD-10-CM | POA: Insufficient documentation

## 2022-11-05 DIAGNOSIS — D1771 Benign lipomatous neoplasm of kidney: Secondary | ICD-10-CM | POA: Diagnosis not present

## 2022-11-05 DIAGNOSIS — J439 Emphysema, unspecified: Secondary | ICD-10-CM | POA: Insufficient documentation

## 2022-11-05 DIAGNOSIS — C7951 Secondary malignant neoplasm of bone: Secondary | ICD-10-CM | POA: Insufficient documentation

## 2022-11-05 DIAGNOSIS — I2510 Atherosclerotic heart disease of native coronary artery without angina pectoris: Secondary | ICD-10-CM | POA: Diagnosis not present

## 2022-11-05 DIAGNOSIS — Z8249 Family history of ischemic heart disease and other diseases of the circulatory system: Secondary | ICD-10-CM | POA: Insufficient documentation

## 2022-11-05 LAB — COMPREHENSIVE METABOLIC PANEL
ALT: 14 U/L (ref 0–44)
AST: 21 U/L (ref 15–41)
Albumin: 4 g/dL (ref 3.5–5.0)
Alkaline Phosphatase: 53 U/L (ref 38–126)
Anion gap: 7 (ref 5–15)
BUN: 11 mg/dL (ref 8–23)
CO2: 42 mmol/L — ABNORMAL HIGH (ref 22–32)
Calcium: 9.1 mg/dL (ref 8.9–10.3)
Chloride: 90 mmol/L — ABNORMAL LOW (ref 98–111)
Creatinine, Ser: 0.63 mg/dL (ref 0.61–1.24)
GFR, Estimated: >60 mL/min (ref >60)
Glucose, Bld: 98 mg/dL (ref 70–99)
Potassium: 4.1 mmol/L (ref 3.5–5.1)
Sodium: 139 mmol/L (ref 135–145)
Total Bilirubin: 0.5 mg/dL (ref 0.3–1.2)
Total Protein: 7.5 g/dL (ref 6.5–8.1)

## 2022-11-05 LAB — CBC WITH DIFFERENTIAL/PLATELET
Abs Immature Granulocytes: 0.01 K/uL (ref 0.00–0.07)
Basophils Absolute: 0 K/uL (ref 0.0–0.1)
Basophils Relative: 1 %
Eosinophils Absolute: 0.7 K/uL — ABNORMAL HIGH (ref 0.0–0.5)
Eosinophils Relative: 13 %
HCT: 41.9 % (ref 39.0–52.0)
Hemoglobin: 12.8 g/dL — ABNORMAL LOW (ref 13.0–17.0)
Immature Granulocytes: 0 %
Lymphocytes Relative: 20 %
Lymphs Abs: 1.1 K/uL (ref 0.7–4.0)
MCH: 30.5 pg (ref 26.0–34.0)
MCHC: 30.5 g/dL (ref 30.0–36.0)
MCV: 99.8 fL (ref 80.0–100.0)
Monocytes Absolute: 0.6 K/uL (ref 0.1–1.0)
Monocytes Relative: 11 %
Neutro Abs: 3.1 K/uL (ref 1.7–7.7)
Neutrophils Relative %: 55 %
Platelets: 147 K/uL — ABNORMAL LOW (ref 150–400)
RBC: 4.2 MIL/uL — ABNORMAL LOW (ref 4.22–5.81)
RDW: 12.2 % (ref 11.5–15.5)
WBC: 5.6 K/uL (ref 4.0–10.5)
nRBC: 0 % (ref 0.0–0.2)

## 2022-11-05 MED ORDER — SODIUM CHLORIDE 0.9 % IV SOLN
200.0000 mg | Freq: Once | INTRAVENOUS | Status: AC
  Administered 2022-11-05: 200 mg via INTRAVENOUS
  Filled 2022-11-05: qty 8

## 2022-11-05 MED ORDER — HEPARIN SOD (PORK) LOCK FLUSH 100 UNIT/ML IV SOLN
500.0000 [IU] | Freq: Once | INTRAVENOUS | Status: AC | PRN
  Administered 2022-11-05: 500 [IU]
  Filled 2022-11-05: qty 5

## 2022-11-05 MED ORDER — SODIUM CHLORIDE 0.9 % IV SOLN
Freq: Once | INTRAVENOUS | Status: AC
  Filled 2022-11-05: qty 250

## 2022-11-05 NOTE — Progress Notes (Signed)
Patient here for oncology follow-up appointment, expresses concerns of chronic SOB 

## 2022-11-05 NOTE — Progress Notes (Signed)
[facility] CONSULT NOTE  Patient Care Team: Alba Cory, MD as PCP - General (Family Medicine) Kieth Brightly, MD (General Surgery) Glory Buff, RN as Oncology Nurse Navigator Earna Coder, MD as Consulting Physician (Hematology and Oncology)  CHIEF COMPLAINTS/PURPOSE OF CONSULTATION: Lung cancer  #  Oncology History Overview Note  # April 2021- RUL lung cancer; liver met; spinal cord compression/ vertebral mets [[facility]]; [[place]]; LIVER Bx- Metastatic adenocarcinoma, consistent with lung primary.- TPS % Interpretation -PD-L1 IHC 10 LOW EXPRESSION POSITIVE   # 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   # 2021- MAY 26th-Keytrda x2 cycles; [borderline PS] July 7th-carbo Alimta Keytruda cycle #1  #November 2022-oligometastatic progression-right upper lobe subpleural involvement with; s/p radiation finished December 15.  Temporarily held [place].  #Jan fourth 2023-restarted Keytruda.  # NGS/MOLECULAR TESTS:   # PALLIATIVE CARE EVALUATION:  # PAIN MANAGEMENT:    DIAGNOSIS:   STAGE:         ;  GOALS:  CURRENT/MOST RECENT THERAPY :     Cancer of upper lobe of right lung  11/23/2019 Initial Diagnosis   Cancer of upper lobe of right lung (HCC)   12/16/2019 -  Chemotherapy   Patient is on Treatment Plan : LUNG Carboplatin (5) + Pemetrexed (500) + Pembrolizumab (200) D1 q21d Induction x 4 cycles / Maintenance Pemetrexed (500) + Pembrolizumab (200) D1 q21d     12/21/2019 - 02/26/2022 Chemotherapy   Patient is on Treatment Plan : LUNG CARBOplatin / Pemetrexed / Pembrolizumab q21d Induction x 4 cycles / Maintenance Pemetrexed + Pembrolizumab      HISTORY OF PRESENTING ILLNESS: Thin built frail appearing male patient.  No acute distress. Alone.  Nasal cannula oxygen.  Ambulating independently.    Cody [name] 67 y.o.  male patient with  metastatic non-small cell lung cancer; cord compression status post decompressive surgery; currently on Keytruda maintenance-is here for follow-up.  Patient expresses concerns of chronic SOB.  Not any worse.Oxygen dependent. Some fatigue. Pt is able to due all his activities of daily living.   Appetite is almost normal. Denies pain. Denies any dizziness.    Review of Systems  Constitutional:  Positive for malaise/fatigue. Negative for chills, diaphoresis and fever.  HENT:  Negative for nosebleeds and sore throat.   Eyes:  Negative for double vision.  Respiratory:  Positive for shortness of breath. Negative for cough, hemoptysis, sputum production and wheezing.   Cardiovascular:  Negative for chest pain, palpitations, orthopnea and leg swelling.  Gastrointestinal:  Negative for abdominal pain, blood in stool, constipation, heartburn, melena, nausea and vomiting.  Genitourinary:  Negative for dysuria, frequency and urgency.  Musculoskeletal:  Positive for back pain and joint pain.  Skin: Negative.  Negative for itching and rash.  Neurological:  Negative for dizziness, tingling, focal weakness, weakness and headaches.  Endo/Heme/Allergies:  Does not bruise/bleed easily.  Psychiatric/Behavioral:  Negative for depression. The patient is not nervous/anxious and does not have insomnia.      MEDICAL HISTORY:  Past Medical History:  Diagnosis Date   Allergy    Cancer of upper lobe of right lung 10/2019   COPD (chronic obstructive pulmonary disease)    Prostate enlargement     SURGICAL HISTORY: Past Surgical History:  Procedure Laterality Date   HERNIA REPAIR Bilateral 1982   HERNIA REPAIR Right 1994   IR IMAGING GUIDED PORT INSERTION  02/24/2020   LAMINECTOMY FOR EXCISION /  EVACUATION INTRASPINAL LESION  11/07/2019   T1-T 6 at [facility]     SOCIAL HISTORY: Social History   Socioeconomic History   Marital status: Married    Spouse name: [name]    Number of children: 4   Years of  education: Not on file   Highest education level: Some college, no degree  Occupational History   Occupation: dispatch and delivery   Tobacco Use   Smoking status: Former    Packs/day: 2.00    Years: 30.00    Additional pack years: 0.00    Total pack years: 60.00    Types: Cigarettes    Quit date: 07/29/2011    Years since quitting: 11.2   Smokeless tobacco: Never   Tobacco comments:    pt vapes  Vaping Use   Vaping Use: Not on file  Substance and Sexual Activity   Alcohol use: Yes    Alcohol/week: 5.0 standard drinks of alcohol    Types: 5 Standard drinks or equivalent per week    Comment: 1-2/day   Drug use: No   Sexual activity: Not Currently    Partners: Female  Other Topics Concern   Not on file  Social History Narrative   Worked in warehouse/ quit smoking in 2015; beer 1/day; in [place]; with wife.    Social Determinants of Health   Financial Resource Strain: Low Risk  (10/21/2019)   Overall Financial Resource Strain (CARDIA)    Difficulty of Paying Living Expenses: Not hard at all  Food Insecurity: No Food Insecurity (10/08/2022)   Hunger Vital Sign    Worried About Running Out of Food in the Last Year: Never true    Ran Out of Food in the Last Year: Never true  Transportation Needs: No Transportation Needs (10/08/2022)   PRAPARE - Administrator, Civil Service (Medical): No    Lack of Transportation (Non-Medical): No  Physical Activity: Insufficiently Active (11/25/2019)   Exercise Vital Sign    Days of Exercise per Week: 7 days    Minutes of Exercise per Session: 20 min  Stress: No Stress Concern Present (10/21/2019)   [employer] of Occupational Health - Occupational Stress Questionnaire    Feeling of Stress : Not at all  Social Connections: Socially Integrated (10/21/2019)   Social Connection and Isolation Panel [NHANES]    Frequency of Communication with Friends and Family: More than three times a week    Frequency of Social Gatherings  with Friends and Family: More than three times a week    Attends Religious Services: More than 4 times per year    Active Member of [employer] or Organizations: Yes    Attends Engineer, structural: More than 4 times per year    Marital Status: Married  Catering manager Violence: Not At Risk (10/21/2019)   Humiliation, Afraid, Rape, and Kick questionnaire    Fear of Current or Ex-Partner: No    Emotionally Abused: No    Physically Abused: No    Sexually Abused: No    FAMILY HISTORY: Family History  Problem Relation Age of Onset   Heart disease Father    CVA Mother    Lung cancer Sister    Lung cancer Brother     ALLERGIES:  has No Known Allergies.  MEDICATIONS:  Current Outpatient Medications  Medication Sig Dispense Refill   acetaminophen (TYLENOL) 500 MG tablet Take 500-1,000 mg by mouth every 6 (six) hours as needed for mild pain or fever.  albuterol (VENTOLIN HFA) 108 (90 Base) MCG/ACT inhaler Inhale 2 puffs into the lungs every 4 (four) hours as needed for wheezing or shortness of breath. 18 g 0   Calcium Carb-Cholecalciferol (OYSTER SHELL CALCIUM) 500-400 MG-UNIT TABS Take 500 tablets by mouth.     escitalopram (LEXAPRO) 5 MG tablet TAKE ONE TABLET BY MOUTH EVERY MORNING 90 tablet 0   levothyroxine (SYNTHROID) 50 MCG tablet TAKE 1 TABLET BY MOUTH DAILY BEFORE BREAKFAST 90 tablet 2   lidocaine-prilocaine (EMLA) cream Apply 1 application topically as needed. Apply small amount to port site at least 1 hour prior to it being accessed, cover with plastic wrap 30 g 1   Loratadine 10 MG CAPS      Multiple Vitamin (MULTIVITAMIN) tablet Take 1 tablet by mouth daily.     QUEtiapine (SEROQUEL) 25 MG tablet TAKE 1 TABLET BY MOUTH AT BEDTIME 90 tablet 1   No current facility-administered medications for this visit.   Facility-Administered Medications Ordered in Other Visits  Medication Dose Route Frequency Provider Last Rate Last Admin   heparin lock flush 100 UNIT/ML  injection               .  PHYSICAL EXAMINATION: ECOG PERFORMANCE STATUS: 3 - Symptomatic, >50% confined to bed  Vitals:   11/05/22 1016 11/05/22 1020  BP: (!) 145/86 (!) 146/89  Pulse: 88 81  Resp: 18   Temp: (!) 96 F (35.6 C)   SpO2: 100%    Filed Weights   11/05/22 1016  Weight: 128 lb (58.1 kg)     Physical Exam HENT:     Head: Normocephalic and atraumatic.     Mouth/Throat:     Pharynx: No oropharyngeal exudate.  Eyes:     Pupils: Pupils are equal, round, and reactive to light.  Cardiovascular:     Rate and Rhythm: Normal rate and regular rhythm.  Pulmonary:     Effort: No respiratory distress.     Breath sounds: No wheezing.     Comments: Decreased air entry bilaterally. Abdominal:     General: Bowel sounds are normal. There is no distension.     Palpations: Abdomen is soft. There is no mass.     Tenderness: There is no abdominal tenderness. There is no guarding or rebound.  Musculoskeletal:        General: No tenderness. Normal range of motion.     Cervical back: Normal range of motion and neck supple.  Skin:    General: Skin is warm.  Neurological:     Mental Status: He is alert and oriented to person, place, and time.  Psychiatric:        Mood and Affect: Affect normal.      LABORATORY DATA:  I have reviewed the data as listed Lab Results  Component Value Date   WBC 5.6 11/05/2022   HGB 12.8 (L) 11/05/2022   HCT 41.9 11/05/2022   MCV 99.8 11/05/2022   PLT 147 (L) 11/05/2022   Recent Labs    09/24/22 0921 10/15/22 0814 11/05/22 1002  NA 139 139 139  K 3.9 3.6 4.1  CL 91* 92* 90*  CO2 38* 39* 42*  GLUCOSE 103* 97 98  BUN CREATININE 0.59* 0.59* 0.63  CALCIUM 9.3 8.8* 9.1  GFRNONAA >60 >60 >60  PROT 7.8 7.0 7.5  ALBUMIN 4.2 3.8 4.0  AST ALT ALKPHOS 56 49 53  BILITOT 0.5 0.5 0.5  RADIOGRAPHIC STUDIES: I have personally reviewed the radiological images as listed and agreed with the findings in  the report. CT CHEST ABDOMEN PELVIS W CONTRAST  Result Date: 10/13/2022 CLINICAL DATA:  Non-small-cell lung cancer, metastatic. Evaluate treatment response. Right upper lobe lung cancer diagnosed in 2021 with right upper lobectomy. Chemotherapy and radiation therapy. Spine and liver metastasis with radiation therapy in 2022. Metastasis to right upper lobe subpleural space, status post radiation therapy in December. * Tracking Code: BO * EXAM: CT CHEST, ABDOMEN, AND PELVIS WITH CONTRAST TECHNIQUE: Multidetector CT imaging of the chest, abdomen and pelvis was performed following the standard protocol during bolus administration of intravenous contrast. RADIATION DOSE REDUCTION: This exam was performed according to the departmental dose-optimization program which includes automated exposure control, adjustment of the mA and/or kV according to patient size and/or use of iterative reconstruction technique. CONTRAST:  100mL OMNIPAQUE IOHEXOL 300 MG/ML  SOLN COMPARISON:  07/04/2022 FINDINGS: CT CHEST FINDINGS Cardiovascular: Right Port-A-Cath tip high right atrium. Aortic atherosclerosis. Normal heart size, without pericardial effusion. Lad coronary artery calcification. No central pulmonary embolism, on this non-dedicated study. Mediastinum/Nodes: No supraclavicular adenopathy. No axillary adenopathy. No mediastinal or hilar adenopathy. Lungs/Pleura: No pleural fluid. Advanced bullous emphysema. Right apical soft tissue fullness is similar and likely related to radiation induced scarring. This is similar in morphology, without well-defined local recurrence. Left apical calcified granuloma. Musculoskeletal: Similar appearance of sclerosis and expansion of posterior third through fifth right ribs. No new osseous abnormality. Status post T1 through T6 trans pedicle screw fixation with similar heterogeneity of and body height loss at T3 and less so T4 consistent with treated metastasis CT ABDOMEN PELVIS FINDINGS  Hepatobiliary: Normal liver. Normal gallbladder, without biliary ductal dilatation. Pancreas: Normal, without mass or ductal dilatation. Spleen: Normal in size, without focal abnormality. Adrenals/Urinary Tract: Normal adrenal glands. Interpolar left renal fat density 8 mm lesion is similar, consistent with a benign angiomyolipoma. Normal right kidney, without hydronephrosis. Normal urinary bladder. Stomach/Bowel: Normal stomach, without wall thickening. Scattered colonic diverticula. Colonic stool burden suggests constipation. Normal small bowel. Vascular/Lymphatic: Advanced aortic and branch vessel atherosclerosis. No abdominopelvic adenopathy. Reproductive: Mild prostatomegaly. Other: No significant free fluid. No evidence of omental or peritoneal disease. Musculoskeletal: Surgical changes in the right pelvis may represent prior inguinal hernia repair. An overlying low-density 2.1 cm subcutaneous lesion may be postoperative or represent an incidental sebaceous cyst. This is similar. Tiny left paramidline ventral abdominal wall hernia contains fat. No acute osseous abnormality. IMPRESSION: 1. Similar appearance of the superior right hemithorax. Presumably radiation induced apical scarring with surrounding sclerosis and enlargement of right posterior third through fifth ribs, likely treated metastasis. 2. Status post T1-6 trans pedicle screw fixation for treated metastasis at T3 and T4. 3. No new or progressive disease. 4. Incidental findings, including: Aortic atherosclerosis (ICD10-I70.0), coronary artery atherosclerosis and emphysema (ICD10-J43.9). Prostatomegaly. Possible constipation. 5. 8 mm benign left renal angiomyolipoma does not warrant imaging follow-up. Electronically Signed   By: Jeronimo GreavesKyle  Talbot M.D.   On: 10/13/2022 15:22     ASSESSMENT & PLAN:   Cancer of upper lobe of right lung (HCC) # Stage IV metastatic non-small cell lung cancer favor adeno; mets to bone; liver.   Oligo-metastatic  progression-s/p RT right upper lobe mass [s/p dec 15th, 2022].  Currently on Keytruda maintenance.   # MARCH 16th, 2024- CT CAP-scarring noted in the right upper thorax;  no evidence of local tumor recurrence;  Stable sclerotic osseous metastases; NO new or progressive metastatic disease in the  chest, abdomen or pelvis.  Stable.   # proceed with [place] maintainance today. Labs today reviewed;  acceptable for treatment today.     # Bone metastases- on zometa 3 mg IVPB.  Currently q 3 M. s/p dental extractions [[facility], Dr.Parks]. Continue multivitamin with calcium plus vitamin D 3 times a day.  S/p extractions. jan 19th, 2024; HOLD zometa- stable. Awaiting dental clearance. vit D 25 OH- march 2024- 70.   # Hypothyroidism- [ Mid sep 2022]  Normal T3-T4.  Currently on 50 mcg a day; MARCH 2023- WNL-stable   # COPD-. On 2-3 lit O2; Trelegy.Clinically- stable    HOLD ZOMETA- last 12/25/2021-q 14M;    # DISPOSITION:  # Keytruda today # follow up in 3 weeks-MD;  port/labs- cbc/cmp;  Keytruda; possible Zometa-Dr.B   All questions were answered. The patient knows to call the clinic with any problems, questions or concerns.    Earna Coder, MD 11/05/2022 11:04 AM

## 2022-11-05 NOTE — Assessment & Plan Note (Addendum)
#   Stage IV metastatic non-small cell lung cancer favor adeno; mets to bone; liver.   Oligo-metastatic progression-s/p RT right upper lobe mass [s/p dec 15th, 2022].  Currently on Keytruda maintenance.   # MARCH 16th, 2024- CT CAP-scarring noted in the right upper thorax;  no evidence of local tumor recurrence;  Stable sclerotic osseous metastases; NO new or progressive metastatic disease in the chest, abdomen or pelvis.  Stable.   # proceed with [place] maintainance today. Labs today reviewed;  acceptable for treatment today.     # Bone metastases- on zometa 3 mg IVPB.  Currently q 3 M. s/p dental extractions [[facility], Dr.Parks]. Continue multivitamin with calcium plus vitamin D 3 times a day.  S/p extractions. jan 19th, 2024; HOLD zometa- stable. Awaiting dental clearance. vit D 25 [place]- march 2024- 70.   # Hypothyroidism- [ Mid sep 2022]  Normal T3-T4.  Currently on 50 mcg a day; MARCH 2023- WNL-stable   # COPD-. On 2-3 lit O2; Trelegy.Clinically- stable    HOLD ZOMETA- last 12/25/2021-q 48M;    # DISPOSITION:  # Keytruda today # follow up in 3 weeks-MD;  port/labs- cbc/cmp;  Keytruda; possible Zometa-Dr.B

## 2022-11-05 NOTE — Patient Instructions (Signed)
[facility] [place] [facility]  Discharge Instructions: Thank you for choosing [facility] to provide your oncology and hematology care.  If you have a lab appointment with the Cancer Center, please go directly to the Cancer Center and check in at the registration area.  Wear comfortable clothing and clothing appropriate for easy access to any Portacath or PICC line.   We strive to give you quality time with your provider. You may need to reschedule your appointment if you arrive late (15 or more minutes).  Arriving late affects you and other patients whose appointments are after yours.  Also, if you miss three or more appointments without notifying the office, you may be dismissed from the clinic at the provider's discretion.      For prescription refill requests, have your pharmacy contact our office and allow 72 hours for refills to be completed.    Today you received the following chemotherapy and/or immunotherapy agents- Keytruda      To help prevent nausea and vomiting after your treatment, we encourage you to take your nausea medication as directed.  BELOW ARE SYMPTOMS THAT SHOULD BE REPORTED IMMEDIATELY: *FEVER GREATER THAN 100.4 F (38 C) OR HIGHER *CHILLS OR SWEATING *NAUSEA AND VOMITING THAT IS NOT CONTROLLED WITH YOUR NAUSEA MEDICATION *UNUSUAL SHORTNESS OF BREATH *UNUSUAL BRUISING OR BLEEDING *URINARY PROBLEMS (pain or burning when urinating, or frequent urination) *BOWEL PROBLEMS (unusual diarrhea, constipation, pain near the anus) TENDERNESS IN MOUTH AND THROAT WITH OR WITHOUT PRESENCE OF ULCERS (sore throat, sores in mouth, or a toothache) UNUSUAL RASH, SWELLING OR PAIN  UNUSUAL VAGINAL DISCHARGE OR ITCHING   Items with * indicate a potential emergency and should be followed up as soon as possible or go to the Emergency Department if any problems should occur.  Please show the CHEMOTHERAPY ALERT CARD or IMMUNOTHERAPY ALERT CARD at check-in to  the Emergency Department and triage nurse.  Should you have questions after your visit or need to cancel or reschedule your appointment, please contact [facility] [facility]  [phone] and follow the prompts.  Office hours are 8:00 a.m. to 4:30 p.m. Monday - Friday. Please note that voicemails left after 4:00 p.m. may not be returned until the following business day.  We are closed weekends and major holidays. You have access to a nurse at all times for urgent questions. Please call the main number to the clinic [phone] and follow the prompts.  For any non-urgent questions, you may also contact your provider using MyChart. We now offer e-Visits for anyone 18 and older to request care online for non-urgent symptoms. For details visit [email or web address].   Also download the MyChart app! Go to the app store, search "MyChart", open the app, select [place], and log in with your MyChart username and password.   

## 2022-11-18 NOTE — Progress Notes (Unsigned)
Name: Cody [name]   MRN: [id number]    DOB: 06/26/1956   Date:11/19/2022       Progress Note  Subjective  Chief Complaint  Hearing Loss  HPI  Hearing loss: he has noticed ear fullness , during exam he was   found to have a lot of ear wax, symptoms improved with ear lavage   Depression.Anxiety: he is doing well on Seroquel for sleep takes about 30 minutes, he is doing much better with lexapro in am's, coping well, feeling better, does not want to stop medication    Lung Cancer/Emphysema/hypoxia/on oxygen: he finished radiation therapy, going finished chemo and now on  Immunotherapy Keytruda that was resumed 07/2021  no side effects, appetite waxes and wanes. He  is on 4 liters of oxygen depending on activity level.  He is no longer using a back brace - used for bone metastasis on his spine , but now able to push his own oxygen tank  . He has been using Trelegy and seems to be stable on medication.  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    Atherosclerosis:discussed statin therapy since no longer under palliative care and he would like to try it    BPH with elevated PSA: not doing any furtherisince lung cancer    Senile purpura: both arms, he states stable. Reassurance given    Patient Active Problem List   Diagnosis Date Noted   Moderate protein-calorie malnutrition 01/14/2021   Atherosclerosis of aorta 01/14/2021   Senile purpura 04/10/2020   S/P spinal fusion 12/23/2019   Goals of care, counseling/discussion 12/11/2019   Hypoxia 11/30/2019   Non-small cell cancer of upper lobe of lung 11/29/2019   COPD with acute exacerbation 11/29/2019   Cancer of upper lobe of right lung  11/23/2019   Metastatic cancer to spine 11/01/2019   BPH with elevated PSA 07/16/2015   COPD bronchitis 07/16/2015   Seasonal and perennial allergic rhinitis 04/24/2010    Past Surgical History:  Procedure Laterality Date   HERNIA REPAIR Bilateral 1982   HERNIA REPAIR Right 1994   IR IMAGING GUIDED PORT INSERTION  02/24/2020   LAMINECTOMY FOR EXCISION / EVACUATION INTRASPINAL LESION  11/07/2019   T1-T 6 at [facility]     Family History  Problem Relation Age of Onset   Heart disease Father    CVA Mother    Lung cancer Sister    Lung cancer Brother     Social History   Tobacco Use   Smoking status: Former    Packs/day: 2.00    Years: 30.00    Additional pack years: 0.00    Total pack years: 60.00    Types: Cigarettes    Quit date: 07/29/2011    Years since quitting: 11.3   Smokeless tobacco: Never   Tobacco comments:    pt vapes  Substance Use Topics   Alcohol use: Yes    Alcohol/week: 5.0 standard drinks of alcohol    Types: 5 Standard drinks or equivalent per week    Comment: 1-2/day     Current Outpatient Medications:    acetaminophen (TYLENOL) 500 MG tablet, Take 500-1,000 mg by mouth every 6 (  six) hours as needed for mild pain or fever. , Disp: , Rfl:    albuterol (VENTOLIN HFA) 108 (90 Base) MCG/ACT inhaler, Inhale 2 puffs into the lungs every 4 (four) hours as needed for wheezing or shortness of breath., Disp: 18 g, Rfl: 0   Calcium Carb-Cholecalciferol (OYSTER SHELL CALCIUM) 500-400 MG-UNIT TABS, Take 500 tablets by mouth., Disp: , Rfl:    escitalopram (LEXAPRO) 5 MG tablet, TAKE ONE TABLET BY MOUTH EVERY MORNING, Disp: 90 tablet, Rfl: 0   levothyroxine (SYNTHROID) 50 MCG tablet, TAKE 1 TABLET BY MOUTH DAILY BEFORE BREAKFAST, Disp: 90 tablet, Rfl: 2   lidocaine-prilocaine (EMLA) cream, Apply 1 application topically as needed. Apply small amount to port site at least 1 hour prior to it being accessed, cover with plastic wrap, Disp: 30 g, Rfl: 1   Loratadine 10 MG  CAPS, , Disp: , Rfl:    Multiple Vitamin (MULTIVITAMIN) tablet, Take 1 tablet by mouth daily., Disp: , Rfl:    QUEtiapine (SEROQUEL) 25 MG tablet, TAKE 1 TABLET BY MOUTH AT BEDTIME, Disp: 90 tablet, Rfl: 1 No current facility-administered medications for this visit.  Facility-Administered Medications Ordered in Other Visits:    heparin lock flush 100 UNIT/ML injection, , , ,   No Known Allergies  I personally reviewed active problem list, medication list, allergies, family history, social history, health maintenance with the patient/caregiver today.   ROS  Constitutional: Negative for fever or weight change.  Respiratory: Negative for cough and shortness of breath.   Cardiovascular: Negative for chest pain or palpitations.  Gastrointestinal: Negative for abdominal pain, no bowel changes.  Musculoskeletal: Negative for gait problem or joint swelling.  Skin: Negative for rash.  Neurological: Negative for dizziness or headache.  No other specific complaints in a complete review of systems (except as listed in HPI above).   Objective  Vitals:   11/19/22 0924  BP: 124/72  Pulse: 81  Resp: 16  SpO2: 99%  Weight: 125 lb (56.7 kg)  Height:  (1.753 m)    Body mass index is 18.46 kg/m.  Physical Exam  Constitutional: Patient appears well-developed and well-nourished. No distress.  HEENT: head atraumatic, normocephalic, pupils equal and reactive to light, neck supple Cardiovascular: Normal rate, regular rhythm and normal heart sounds.  No murmur heard. No BLE edema. Pulmonary/Chest: Effort normal and breath sounds normal. No respiratory distress. Abdominal: Soft.  There is no tenderness. Psychiatric: Patient has a normal mood and affect. behavior is normal. Judgment and thought content normal.    PHQ2/9:    11/19/2022    9:24 AM 01/14/2021   10:43 AM 08/27/2020    9:48 AM 07/10/2020    2:07 PM 04/10/2020   10:51 AM  Depression screen PHQ 2/9  Decreased Interest 0 0 0 0  0  Down, Depressed, Hopeless 0 1 0 0 0  PHQ - 2 Score 0 1 0 0 0  Altered sleeping 0   1 0  Tired, decreased energy 0 1  1 0  Change in appetite 0 0  1 0  Feeling bad or failure about yourself  0 1  0 0  Trouble concentrating 0 0  0 0  Moving slowly or fidgety/restless 0 0  0 0  Suicidal thoughts 0 0  0 0  PHQ-9 Score 0   3 0  Difficult doing work/chores    Not difficult at all     phq 9 is negative   Fall Risk:    11/19/2022  9:24 AM 01/14/2021   10:43 AM 08/27/2020    9:48 AM 07/10/2020    2:04 PM 04/10/2020   10:51 AM  Fall Risk   Falls in the past year? 0 0 0 0 0  Number falls in past yr: 0 0 0 0 0  Injury with Fall? 0 0 0 0 0  Risk for fall due to : No Fall Risks   Impaired mobility   Follow up Falls prevention discussed          Functional Status Survey: Is the patient deaf or have difficulty hearing?: Yes Does the patient have difficulty seeing, even when wearing glasses/contacts?: No Does the patient have difficulty concentrating, remembering, or making decisions?: No Does the patient have difficulty walking or climbing stairs?: Yes Does the patient have difficulty dressing or bathing?: No Does the patient have difficulty doing errands alone such as visiting a doctor's office or shopping?: Yes    Assessment & Plan  1. Bilateral impacted cerumen  - Ear Lavage  2. Moderate protein-calorie malnutrition  He is trying to eat more   3. Atherosclerosis of aorta  - pravastatin (PRAVACHOL) 20 MG tablet; Take 1 tablet (20 mg total) by mouth daily.  Dispense: 90 tablet; Refill: 3 - Lipid Profile  4. Centrilobular emphysema  stable  5. Senile purpura  Reassurance given   6. BPH with elevated PSA   7. Dysthymia  Continue medication  8. Insomnia, unspecified type  Continue Seroquel   9. Paresthesia of lower extremity  Since back surgery

## 2022-11-19 ENCOUNTER — Ambulatory Visit (INDEPENDENT_AMBULATORY_CARE_PROVIDER_SITE_OTHER): Payer: Medicare HMO | Admitting: Family Medicine

## 2022-11-19 ENCOUNTER — Encounter: Payer: Self-pay | Admitting: Family Medicine

## 2022-11-19 VITALS — BP 124/72 | HR 81 | Resp 16 | Ht 69.0 in | Wt 125.0 lb

## 2022-11-19 DIAGNOSIS — G4700 Insomnia, unspecified: Secondary | ICD-10-CM | POA: Diagnosis not present

## 2022-11-19 DIAGNOSIS — J432 Centrilobular emphysema: Secondary | ICD-10-CM

## 2022-11-19 DIAGNOSIS — R972 Elevated prostate specific antigen [PSA]: Secondary | ICD-10-CM | POA: Diagnosis not present

## 2022-11-19 DIAGNOSIS — R69 Illness, unspecified: Secondary | ICD-10-CM | POA: Diagnosis not present

## 2022-11-19 DIAGNOSIS — D692 Other nonthrombocytopenic purpura: Secondary | ICD-10-CM

## 2022-11-19 DIAGNOSIS — E440 Moderate protein-calorie malnutrition: Secondary | ICD-10-CM | POA: Diagnosis not present

## 2022-11-19 DIAGNOSIS — H6123 Impacted cerumen, bilateral: Secondary | ICD-10-CM | POA: Diagnosis not present

## 2022-11-19 DIAGNOSIS — R202 Paresthesia of skin: Secondary | ICD-10-CM

## 2022-11-19 DIAGNOSIS — I700 Atherosclerosis of aorta: Secondary | ICD-10-CM | POA: Diagnosis not present

## 2022-11-19 DIAGNOSIS — N400 Enlarged prostate without lower urinary tract symptoms: Secondary | ICD-10-CM

## 2022-11-19 DIAGNOSIS — F341 Dysthymic disorder: Secondary | ICD-10-CM

## 2022-11-19 MED ORDER — PRAVASTATIN SODIUM 20 MG PO TABS
20.0000 mg | ORAL_TABLET | Freq: Every day | ORAL | 3 refills | Status: DC
Start: 2022-11-19 — End: 2023-11-09

## 2022-11-19 NOTE — Addendum Note (Signed)
Addended by: Alba Cory F on: 11/19/2022 10:22 AM   Modules accepted: Orders

## 2022-11-26 ENCOUNTER — Inpatient Hospital Stay: Payer: Medicare HMO

## 2022-11-26 ENCOUNTER — Encounter: Payer: Self-pay | Admitting: Internal Medicine

## 2022-11-26 ENCOUNTER — Inpatient Hospital Stay: Payer: Medicare HMO | Attending: Internal Medicine | Admitting: Internal Medicine

## 2022-11-26 VITALS — BP 154/69 | HR 86 | Temp 96.7°F | Ht 69.0 in | Wt 127.4 lb

## 2022-11-26 DIAGNOSIS — R0602 Shortness of breath: Secondary | ICD-10-CM | POA: Insufficient documentation

## 2022-11-26 DIAGNOSIS — Z7189 Other specified counseling: Secondary | ICD-10-CM

## 2022-11-26 DIAGNOSIS — Z8249 Family history of ischemic heart disease and other diseases of the circulatory system: Secondary | ICD-10-CM | POA: Diagnosis not present

## 2022-11-26 DIAGNOSIS — C7951 Secondary malignant neoplasm of bone: Secondary | ICD-10-CM | POA: Diagnosis not present

## 2022-11-26 DIAGNOSIS — Z79899 Other long term (current) drug therapy: Secondary | ICD-10-CM | POA: Insufficient documentation

## 2022-11-26 DIAGNOSIS — N400 Enlarged prostate without lower urinary tract symptoms: Secondary | ICD-10-CM | POA: Insufficient documentation

## 2022-11-26 DIAGNOSIS — C3411 Malignant neoplasm of upper lobe, right bronchus or lung: Secondary | ICD-10-CM

## 2022-11-26 DIAGNOSIS — Z9981 Dependence on supplemental oxygen: Secondary | ICD-10-CM | POA: Insufficient documentation

## 2022-11-26 DIAGNOSIS — C787 Secondary malignant neoplasm of liver and intrahepatic bile duct: Secondary | ICD-10-CM | POA: Diagnosis not present

## 2022-11-26 DIAGNOSIS — Z79620 Long term (current) use of immunosuppressive biologic: Secondary | ICD-10-CM | POA: Insufficient documentation

## 2022-11-26 DIAGNOSIS — Z87891 Personal history of nicotine dependence: Secondary | ICD-10-CM | POA: Insufficient documentation

## 2022-11-26 DIAGNOSIS — E039 Hypothyroidism, unspecified: Secondary | ICD-10-CM | POA: Insufficient documentation

## 2022-11-26 DIAGNOSIS — Z801 Family history of malignant neoplasm of trachea, bronchus and lung: Secondary | ICD-10-CM | POA: Diagnosis not present

## 2022-11-26 DIAGNOSIS — J449 Chronic obstructive pulmonary disease, unspecified: Secondary | ICD-10-CM | POA: Insufficient documentation

## 2022-11-26 DIAGNOSIS — R5383 Other fatigue: Secondary | ICD-10-CM | POA: Diagnosis not present

## 2022-11-26 DIAGNOSIS — Z823 Family history of stroke: Secondary | ICD-10-CM | POA: Diagnosis not present

## 2022-11-26 DIAGNOSIS — M549 Dorsalgia, unspecified: Secondary | ICD-10-CM | POA: Diagnosis not present

## 2022-11-26 DIAGNOSIS — Z5112 Encounter for antineoplastic immunotherapy: Secondary | ICD-10-CM | POA: Diagnosis not present

## 2022-11-26 DIAGNOSIS — M2550 Pain in unspecified joint: Secondary | ICD-10-CM | POA: Diagnosis not present

## 2022-11-26 DIAGNOSIS — Z923 Personal history of irradiation: Secondary | ICD-10-CM | POA: Diagnosis not present

## 2022-11-26 LAB — CBC WITH DIFFERENTIAL/PLATELET
Abs Immature Granulocytes: 0.03 10*3/uL (ref 0.00–0.07)
Basophils Absolute: 0 10*3/uL (ref 0.0–0.1)
Basophils Relative: 1 %
Eosinophils Absolute: 0.5 10*3/uL (ref 0.0–0.5)
Eosinophils Relative: 6 %
HCT: 40.2 % (ref 39.0–52.0)
Hemoglobin: 12.5 g/dL — ABNORMAL LOW (ref 13.0–17.0)
Immature Granulocytes: 0 %
Lymphocytes Relative: 16 %
Lymphs Abs: 1.2 10*3/uL (ref 0.7–4.0)
MCH: 30.8 pg (ref 26.0–34.0)
MCHC: 31.1 g/dL (ref 30.0–36.0)
MCV: 99 fL (ref 80.0–100.0)
Monocytes Absolute: 0.7 10*3/uL (ref 0.1–1.0)
Monocytes Relative: 9 %
Neutro Abs: 5.5 10*3/uL (ref 1.7–7.7)
Neutrophils Relative %: 68 %
Platelets: 147 10*3/uL — ABNORMAL LOW (ref 150–400)
RBC: 4.06 MIL/uL — ABNORMAL LOW (ref 4.22–5.81)
RDW: 12.1 % (ref 11.5–15.5)
WBC: 7.9 10*3/uL (ref 4.0–10.5)
nRBC: 0 % (ref 0.0–0.2)

## 2022-11-26 LAB — COMPREHENSIVE METABOLIC PANEL
ALT: 12 U/L (ref 0–44)
AST: 20 U/L (ref 15–41)
Albumin: 3.9 g/dL (ref 3.5–5.0)
Alkaline Phosphatase: 56 U/L (ref 38–126)
Anion gap: 10 (ref 5–15)
BUN: 9 mg/dL (ref 8–23)
CO2: 38 mmol/L — ABNORMAL HIGH (ref 22–32)
Calcium: 8.7 mg/dL — ABNORMAL LOW (ref 8.9–10.3)
Chloride: 87 mmol/L — ABNORMAL LOW (ref 98–111)
Creatinine, Ser: 0.51 mg/dL — ABNORMAL LOW (ref 0.61–1.24)
GFR, Estimated: >60 mL/min (ref >60)
Glucose, Bld: 98 mg/dL (ref 70–99)
Potassium: 3.8 mmol/L (ref 3.5–5.1)
Sodium: 135 mmol/L (ref 135–145)
Total Bilirubin: 0.4 mg/dL (ref 0.3–1.2)
Total Protein: 7.3 g/dL (ref 6.5–8.1)

## 2022-11-26 MED ORDER — ZOLEDRONIC ACID 4 MG/5ML IV CONC
3.0000 mg | Freq: Once | INTRAVENOUS | Status: AC
  Administered 2022-11-26: 3 mg via INTRAVENOUS
  Filled 2022-11-26: qty 3.75

## 2022-11-26 MED ORDER — HEPARIN SOD (PORK) LOCK FLUSH 100 UNIT/ML IV SOLN
250.0000 [IU] | Freq: Once | INTRAVENOUS | Status: AC | PRN
  Administered 2022-11-26: 250 [IU]
  Filled 2022-11-26: qty 5

## 2022-11-26 MED ORDER — SODIUM CHLORIDE 0.9 % IV SOLN
200.0000 mg | Freq: Once | INTRAVENOUS | Status: AC
  Administered 2022-11-26: 200 mg via INTRAVENOUS
  Filled 2022-11-26: qty 200

## 2022-11-26 MED ORDER — SODIUM CHLORIDE 0.9 % IV SOLN
Freq: Once | INTRAVENOUS | Status: AC
  Filled 2022-11-26: qty 250

## 2022-11-26 NOTE — Assessment & Plan Note (Addendum)
# [  april2021] Stage IV metastatic non-small cell lung cancer favor adeno; mets to bone; liver.   Oligo-metastatic progression-s/p RT right upper lobe mass [s/p dec 15th, 2022].  Currently on Keytruda maintenance.   # MARCH 16th, 2024- CT CAP-scarring noted in the right upper thorax;  no evidence of local tumor recurrence;  Stable sclerotic osseous metastases; NO new or progressive metastatic disease in the chest, abdomen or pelvis.  Stable.   # proceed with [place] maintainance today. Labs today reviewed;  acceptable for treatment today.     # Bone metastases- [[facility], Dr.Parks]. Continue multivitamin with calcium plus vitamin D 3 times a day.  S/p extractions. jan 19th, 2024; s/p dental clearance [April 2024]. vit D 25 OH- march 2024- 70. Resume Zometa 3 mg q 3 M.   # Hypothyroidism- [ Mid sep 2022]  Normal T3-T4.  Currently on 50 mcg a day; MARCH 2023- WNL-stable   # COPD-. On 2-3 lit O2; Trelegy.Clinically-stable    HOLD ZOMETA- last 11/26/2022 q 7M;    # DISPOSITION:  # Keytruda today; Zometa today- ok with calcium 8.7  # follow up in 3 weeks-MD;  port/labs- cbc/cmp;  Keytruda;-Dr.B

## 2022-11-26 NOTE — Progress Notes (Signed)
[facility] CONSULT NOTE  Patient Care Team: Alba Cory, MD as PCP - General (Family Medicine) Kieth Brightly, MD (General Surgery) Glory Buff, RN as Oncology Nurse Navigator Earna Coder, MD as Consulting Physician (Hematology and Oncology)  CHIEF COMPLAINTS/PURPOSE OF CONSULTATION: Lung cancer  #  Oncology History Overview Note  # April 2021- RUL lung cancer; liver met; spinal cord compression/ vertebral mets [[facility]]; [[place]]; LIVER Bx- Metastatic adenocarcinoma, consistent with lung primary.- TPS % Interpretation -PD-L1 IHC 10 LOW EXPRESSION POSITIVE   # 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   # 2021- MAY 26th-Keytrda x2 cycles; [borderline PS] July 7th-carbo Alimta Keytruda cycle #1  #November 2022-oligometastatic progression-right upper lobe subpleural involvement with; s/p radiation finished December 15.  Temporarily held [place].  #Jan fourth 2023-restarted Keytruda.  # NGS/MOLECULAR TESTS:   # PALLIATIVE CARE EVALUATION:  # PAIN MANAGEMENT:    DIAGNOSIS:   STAGE:         ;  GOALS:  CURRENT/MOST RECENT THERAPY :     Cancer of upper lobe of right lung (HCC)  11/23/2019 Initial Diagnosis   Cancer of upper lobe of right lung (HCC)   12/16/2019 -  Chemotherapy   Patient is on Treatment Plan : LUNG Carboplatin (5) + Pemetrexed (500) + Pembrolizumab (200) D1 q21d Induction x 4 cycles / Maintenance Pemetrexed (500) + Pembrolizumab (200) D1 q21d     12/21/2019 - 02/26/2022 Chemotherapy   Patient is on Treatment Plan : LUNG CARBOplatin / Pemetrexed / Pembrolizumab q21d Induction x 4 cycles / Maintenance Pemetrexed + Pembrolizumab      HISTORY OF PRESENTING ILLNESS: Thin built frail appearing male patient.  No acute distress. Alone.  Nasal cannula oxygen.  Ambulating independently.    Cody [name] 67 y.o.  male patient  with metastatic non-small cell lung cancer; cord compression status post decompressive surgery; currently on Keytruda maintenance-is here for follow-up.  Patient expresses concerns of chronic SOB.  Not any worse.Oxygen dependent. Some fatigue. Pt is able to due all his activities of daily living.   Appetite is almost normal. Denies pain. Denies any dizziness.    Review of Systems  Constitutional:  Positive for malaise/fatigue. Negative for chills, diaphoresis and fever.  HENT:  Negative for nosebleeds and sore throat.   Eyes:  Negative for double vision.  Respiratory:  Positive for shortness of breath. Negative for cough, hemoptysis, sputum production and wheezing.   Cardiovascular:  Negative for chest pain, palpitations, orthopnea and leg swelling.  Gastrointestinal:  Negative for abdominal pain, blood in stool, constipation, heartburn, melena, nausea and vomiting.  Genitourinary:  Negative for dysuria, frequency and urgency.  Musculoskeletal:  Positive for back pain and joint pain.  Skin: Negative.  Negative for itching and rash.  Neurological:  Negative for dizziness, tingling, focal weakness, weakness and headaches.  Endo/Heme/Allergies:  Does not bruise/bleed easily.  Psychiatric/Behavioral:  Negative for depression. The patient is not nervous/anxious and does not have insomnia.      MEDICAL HISTORY:  Past Medical History:  Diagnosis Date   Allergy    Cancer of upper lobe of right lung (HCC) 10/2019   COPD (chronic obstructive pulmonary disease) (HCC)    Prostate enlargement     SURGICAL HISTORY: Past Surgical History:  Procedure Laterality Date   HERNIA REPAIR Bilateral 1982   HERNIA REPAIR Right 1994   IR IMAGING GUIDED PORT INSERTION  02/24/2020  LAMINECTOMY FOR EXCISION / EVACUATION INTRASPINAL LESION  11/07/2019   T1-T 6 at [facility]     SOCIAL HISTORY: Social History   Socioeconomic History   Marital status: Married    Spouse name: [name]    Number of children: 4    Years of education: Not on file   Highest education level: Some college, no degree  Occupational History   Occupation: dispatch and delivery   Tobacco Use   Smoking status: Former    Packs/day: 2.00    Years: 30.00    Additional pack years: 0.00    Total pack years: 60.00    Types: Cigarettes    Quit date: 07/29/2011    Years since quitting: 11.3   Smokeless tobacco: Never   Tobacco comments:    pt vapes  Vaping Use   Vaping Use: Not on file  Substance and Sexual Activity   Alcohol use: Yes    Alcohol/week: 5.0 standard drinks of alcohol    Types: 5 Standard drinks or equivalent per week    Comment: 1-2/day   Drug use: No   Sexual activity: Not Currently    Partners: Female  Other Topics Concern   Not on file  Social History Narrative   Worked in warehouse/ quit smoking in 2015; beer 1/day; in [place]; with wife.    Social Determinants of Health   Financial Resource Strain: Low Risk  (11/15/2022)   Overall Financial Resource Strain (CARDIA)    Difficulty of Paying Living Expenses: Not very hard  Food Insecurity: No Food Insecurity (11/15/2022)   Hunger Vital Sign    Worried About Running Out of Food in the Last Year: Never true    Ran Out of Food in the Last Year: Never true  Transportation Needs: No Transportation Needs (11/15/2022)   PRAPARE - Administrator, Civil Service (Medical): No    Lack of Transportation (Non-Medical): No  Physical Activity: Unknown (11/15/2022)   Exercise Vital Sign    Days of Exercise per Week: Patient declined    Minutes of Exercise per Session: Not on file  Stress: No Stress Concern Present (11/15/2022)   [employer] of Occupational Health - Occupational Stress Questionnaire    Feeling of Stress : Only a little  Social Connections: Unknown (11/15/2022)   Social Connection and Isolation Panel [NHANES]    Frequency of Communication with Friends and Family: Three times a week    Frequency of Social Gatherings with Friends  and Family: Patient declined    Attends Religious Services: More than 4 times per year    Active Member of [employer] or Organizations: Not on file    Attends Banker Meetings: Not on file    Marital Status: Married  Intimate Partner Violence: Not At Risk (10/21/2019)   Humiliation, Afraid, Rape, and Kick questionnaire    Fear of Current or Ex-Partner: No    Emotionally Abused: No    Physically Abused: No    Sexually Abused: No    FAMILY HISTORY: Family History  Problem Relation Age of Onset   Heart disease Father    CVA Mother    Lung cancer Sister    Lung cancer Brother     ALLERGIES:  has No Known Allergies.  MEDICATIONS:  Current Outpatient Medications  Medication Sig Dispense Refill   acetaminophen (TYLENOL) 500 MG tablet Take 500-1,000 mg by mouth every 6 (six) hours as needed for mild pain or fever.      albuterol (VENTOLIN HFA) 108 (  90 Base) MCG/ACT inhaler Inhale 2 puffs into the lungs every 4 (four) hours as needed for wheezing or shortness of breath. 18 g 0   Calcium Carb-Cholecalciferol (OYSTER SHELL CALCIUM) 500-400 MG-UNIT TABS Take 500 tablets by mouth.     escitalopram (LEXAPRO) 5 MG tablet TAKE ONE TABLET BY MOUTH EVERY MORNING 90 tablet 0   levothyroxine (SYNTHROID) 50 MCG tablet TAKE 1 TABLET BY MOUTH DAILY BEFORE BREAKFAST 90 tablet 2   lidocaine-prilocaine (EMLA) cream Apply 1 application topically as needed. Apply small amount to port site at least 1 hour prior to it being accessed, cover with plastic wrap 30 g 1   Loratadine 10 MG CAPS      Multiple Vitamin (MULTIVITAMIN) tablet Take 1 tablet by mouth daily.     QUEtiapine (SEROQUEL) 25 MG tablet TAKE 1 TABLET BY MOUTH AT BEDTIME 90 tablet 1   pravastatin (PRAVACHOL) 20 MG tablet Take 1 tablet (20 mg total) by mouth daily. (Patient not taking: Reported on 11/26/2022) 90 tablet 3   No current facility-administered medications for this visit.   Facility-Administered Medications Ordered in Other Visits   Medication Dose Route Frequency Provider Last Rate Last Admin   heparin lock flush 100 UNIT/ML injection               .  PHYSICAL EXAMINATION: ECOG PERFORMANCE STATUS: 3 - Symptomatic, >50% confined to bed  Vitals:   11/26/22 1434  BP: (!) 154/69  Pulse: 86  Temp: (!) 96.7 F (35.9 C)  SpO2: 90%   Filed Weights   11/26/22 1434  Weight: 127 lb 6.4 oz (57.8 kg)     Physical Exam HENT:     Head: Normocephalic and atraumatic.     Mouth/Throat:     Pharynx: No oropharyngeal exudate.  Eyes:     Pupils: Pupils are equal, round, and reactive to light.  Cardiovascular:     Rate and Rhythm: Normal rate and regular rhythm.  Pulmonary:     Effort: No respiratory distress.     Breath sounds: No wheezing.     Comments: Decreased air entry bilaterally. Abdominal:     General: Bowel sounds are normal. There is no distension.     Palpations: Abdomen is soft. There is no mass.     Tenderness: There is no abdominal tenderness. There is no guarding or rebound.  Musculoskeletal:        General: No tenderness. Normal range of motion.     Cervical back: Normal range of motion and neck supple.  Skin:    General: Skin is warm.  Neurological:     Mental Status: He is alert and oriented to person, place, and time.  Psychiatric:        Mood and Affect: Affect normal.      LABORATORY DATA:  I have reviewed the data as listed Lab Results  Component Value Date   WBC 7.9 11/26/2022   HGB 12.5 (L) 11/26/2022   HCT 40.2 11/26/2022   MCV 99.0 11/26/2022   PLT 147 (L) 11/26/2022   Recent Labs    10/15/22 0814 11/05/22 1002 11/26/22 1438  NA 139 139 135  K 3.6 4.1 3.8  CL 92* 90* 87*  CO2 39* 42* 38*  GLUCOSE 97 98 98  BUN 9 11 9   CREATININE 0.59* 0.63 0.51*  CALCIUM 8.8* 9.1 8.7*  GFRNONAA >60 >60 >60  PROT 7.0 7.5 7.3  ALBUMIN 3.8 4.0 3.9  AST 21 21 20   ALT 13 14 12  ALKPHOS 49 53 56  BILITOT 0.5 0.5 0.4    RADIOGRAPHIC STUDIES: I have personally reviewed the  radiological images as listed and agreed with the findings in the report. No results found.   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   Oligo-metastatic progression-s/p RT right upper lobe mass [s/p dec 15th, 2022].  Currently on Keytruda maintenance.   # MARCH 16th, 2024- CT CAP-scarring noted in the right upper thorax;  no evidence of local tumor recurrence;  Stable sclerotic osseous metastases; NO new or progressive metastatic disease in the chest, abdomen or pelvis.  Stable.   # proceed with [place] maintainance today. Labs today reviewed;  acceptable for treatment today.     # Bone metastases- [[facility], Dr.Parks]. Continue multivitamin with calcium plus vitamin D 3 times a day.  S/p extractions. jan 19th, 2024; s/p dental clearance [April 2024]. vit D 25 OH- march 2024- 70. Resume Zometa 3 mg q 3 M.   # Hypothyroidism- [ Mid sep 2022]  Normal T3-T4.  Currently on 50 mcg a day; MARCH 2023- WNL-stable   # COPD-. On 2-3 lit O2; Trelegy.Clinically-stable    HOLD ZOMETA- last 11/26/2022 q 75M;    # DISPOSITION:  # Keytruda today; Zometa today- ok with calcium 8.7  # follow up in 3 weeks-MD;  port/labs- cbc/cmp;  Keytruda;-Dr.B    All questions were answered. The patient knows to call the clinic with any problems, questions or concerns.    Earna Coder, MD 11/26/2022 3:13 PM

## 2022-11-26 NOTE — Progress Notes (Signed)
No concerns today 

## 2022-11-26 NOTE — Patient Instructions (Signed)
[facility]  Discharge Instructions: Thank you for choosing [facility] to provide your oncology and hematology care.  If you have a lab appointment with the Cancer Center, please go directly to the Cancer Center and check in at the registration area.  Wear comfortable clothing and clothing appropriate for easy access to any Portacath or PICC line.   We strive to give you quality time with your provider. You may need to reschedule your appointment if you arrive late (15 or more minutes).  Arriving late affects you and other patients whose appointments are after yours.  Also, if you miss three or more appointments without notifying the office, you may be dismissed from the clinic at the provider's discretion.      For prescription refill requests, have your pharmacy contact our office and allow 72 hours for refills to be completed.     To help prevent nausea and vomiting after your treatment, we encourage you to take your nausea medication as directed.  BELOW ARE SYMPTOMS THAT SHOULD BE REPORTED IMMEDIATELY: *FEVER GREATER THAN 100.4 F (38 C) OR HIGHER *CHILLS OR SWEATING *NAUSEA AND VOMITING THAT IS NOT CONTROLLED WITH YOUR NAUSEA MEDICATION *UNUSUAL SHORTNESS OF BREATH *UNUSUAL BRUISING OR BLEEDING *URINARY PROBLEMS (pain or burning when urinating, or frequent urination) *BOWEL PROBLEMS (unusual diarrhea, constipation, pain near the anus) TENDERNESS IN MOUTH AND THROAT WITH OR WITHOUT PRESENCE OF ULCERS (sore throat, sores in mouth, or a toothache) UNUSUAL RASH, SWELLING OR PAIN  UNUSUAL VAGINAL DISCHARGE OR ITCHING   Items with * indicate a potential emergency and should be followed up as soon as possible or go to the Emergency Department if any problems should occur.  Please show the CHEMOTHERAPY ALERT CARD or IMMUNOTHERAPY ALERT CARD at check-in to the Emergency Department and triage nurse.  Should you have questions after your visit  or need to cancel or reschedule your appointment, please contact [facility]  [phone] and follow the prompts.  Office hours are 8:00 a.m. to 4:30 p.m. Monday - Friday. Please note that voicemails left after 4:00 p.m. may not be returned until the following business day.  We are closed weekends and major holidays. You have access to a nurse at all times for urgent questions. Please call the main number to the clinic [phone] and follow the prompts.  For any non-urgent questions, you may also contact your provider using MyChart. We now offer e-Visits for anyone 18 and older to request care online for non-urgent symptoms. For details visit [email or web address].   Also download the MyChart app! Go to the app store, search "MyChart", open the app, select , and log in with your MyChart username and password.    

## 2022-11-30 DIAGNOSIS — J441 Chronic obstructive pulmonary disease with (acute) exacerbation: Secondary | ICD-10-CM | POA: Diagnosis not present

## 2022-11-30 DIAGNOSIS — J9601 Acute respiratory failure with hypoxia: Secondary | ICD-10-CM | POA: Diagnosis not present

## 2022-12-17 ENCOUNTER — Inpatient Hospital Stay: Payer: Medicare HMO

## 2022-12-17 ENCOUNTER — Encounter: Payer: Self-pay | Admitting: Internal Medicine

## 2022-12-17 ENCOUNTER — Inpatient Hospital Stay (HOSPITAL_BASED_OUTPATIENT_CLINIC_OR_DEPARTMENT_OTHER): Payer: Medicare HMO | Admitting: Internal Medicine

## 2022-12-17 VITALS — BP 145/71 | HR 95 | Temp 96.5°F | Ht 69.0 in | Wt 126.2 lb

## 2022-12-17 DIAGNOSIS — Z923 Personal history of irradiation: Secondary | ICD-10-CM | POA: Diagnosis not present

## 2022-12-17 DIAGNOSIS — Z8249 Family history of ischemic heart disease and other diseases of the circulatory system: Secondary | ICD-10-CM | POA: Diagnosis not present

## 2022-12-17 DIAGNOSIS — M2550 Pain in unspecified joint: Secondary | ICD-10-CM | POA: Diagnosis not present

## 2022-12-17 DIAGNOSIS — Z7189 Other specified counseling: Secondary | ICD-10-CM

## 2022-12-17 DIAGNOSIS — C3411 Malignant neoplasm of upper lobe, right bronchus or lung: Secondary | ICD-10-CM

## 2022-12-17 DIAGNOSIS — C7951 Secondary malignant neoplasm of bone: Secondary | ICD-10-CM | POA: Diagnosis not present

## 2022-12-17 DIAGNOSIS — Z9981 Dependence on supplemental oxygen: Secondary | ICD-10-CM | POA: Diagnosis not present

## 2022-12-17 DIAGNOSIS — J449 Chronic obstructive pulmonary disease, unspecified: Secondary | ICD-10-CM | POA: Diagnosis not present

## 2022-12-17 DIAGNOSIS — M549 Dorsalgia, unspecified: Secondary | ICD-10-CM | POA: Diagnosis not present

## 2022-12-17 DIAGNOSIS — Z79620 Long term (current) use of immunosuppressive biologic: Secondary | ICD-10-CM | POA: Diagnosis not present

## 2022-12-17 DIAGNOSIS — Z801 Family history of malignant neoplasm of trachea, bronchus and lung: Secondary | ICD-10-CM | POA: Diagnosis not present

## 2022-12-17 DIAGNOSIS — C787 Secondary malignant neoplasm of liver and intrahepatic bile duct: Secondary | ICD-10-CM | POA: Diagnosis not present

## 2022-12-17 DIAGNOSIS — N400 Enlarged prostate without lower urinary tract symptoms: Secondary | ICD-10-CM | POA: Diagnosis not present

## 2022-12-17 DIAGNOSIS — Z79899 Other long term (current) drug therapy: Secondary | ICD-10-CM | POA: Diagnosis not present

## 2022-12-17 DIAGNOSIS — R5383 Other fatigue: Secondary | ICD-10-CM | POA: Diagnosis not present

## 2022-12-17 DIAGNOSIS — E039 Hypothyroidism, unspecified: Secondary | ICD-10-CM | POA: Diagnosis not present

## 2022-12-17 DIAGNOSIS — R0602 Shortness of breath: Secondary | ICD-10-CM | POA: Diagnosis not present

## 2022-12-17 DIAGNOSIS — Z823 Family history of stroke: Secondary | ICD-10-CM | POA: Diagnosis not present

## 2022-12-17 DIAGNOSIS — Z5112 Encounter for antineoplastic immunotherapy: Secondary | ICD-10-CM | POA: Diagnosis not present

## 2022-12-17 DIAGNOSIS — Z87891 Personal history of nicotine dependence: Secondary | ICD-10-CM | POA: Diagnosis not present

## 2022-12-17 LAB — CBC WITH DIFFERENTIAL/PLATELET
Abs Immature Granulocytes: 0.01 10*3/uL (ref 0.00–0.07)
Basophils Absolute: 0 10*3/uL (ref 0.0–0.1)
Basophils Relative: 1 %
Eosinophils Absolute: 0.5 10*3/uL (ref 0.0–0.5)
Eosinophils Relative: 8 %
HCT: 39.4 % (ref 39.0–52.0)
Hemoglobin: 12.2 g/dL — ABNORMAL LOW (ref 13.0–17.0)
Immature Granulocytes: 0 %
Lymphocytes Relative: 19 %
Lymphs Abs: 1.1 10*3/uL (ref 0.7–4.0)
MCH: 30.7 pg (ref 26.0–34.0)
MCHC: 31 g/dL (ref 30.0–36.0)
MCV: 99 fL (ref 80.0–100.0)
Monocytes Absolute: 0.7 10*3/uL (ref 0.1–1.0)
Monocytes Relative: 12 %
Neutro Abs: 3.5 10*3/uL (ref 1.7–7.7)
Neutrophils Relative %: 60 %
Platelets: 145 10*3/uL — ABNORMAL LOW (ref 150–400)
RBC: 3.98 MIL/uL — ABNORMAL LOW (ref 4.22–5.81)
RDW: 11.9 % (ref 11.5–15.5)
WBC: 5.9 10*3/uL (ref 4.0–10.5)
nRBC: 0 % (ref 0.0–0.2)

## 2022-12-17 LAB — COMPREHENSIVE METABOLIC PANEL
ALT: 13 U/L (ref 0–44)
AST: 21 U/L (ref 15–41)
Albumin: 3.9 g/dL (ref 3.5–5.0)
Alkaline Phosphatase: 52 U/L (ref 38–126)
Anion gap: 12 (ref 5–15)
BUN: 9 mg/dL (ref 8–23)
CO2: 38 mmol/L — ABNORMAL HIGH (ref 22–32)
Calcium: 9 mg/dL (ref 8.9–10.3)
Chloride: 88 mmol/L — ABNORMAL LOW (ref 98–111)
Creatinine, Ser: 0.59 mg/dL — ABNORMAL LOW (ref 0.61–1.24)
GFR, Estimated: >60 mL/min (ref >60)
Glucose, Bld: 91 mg/dL (ref 70–99)
Potassium: 4.1 mmol/L (ref 3.5–5.1)
Sodium: 138 mmol/L (ref 135–145)
Total Bilirubin: 0.5 mg/dL (ref 0.3–1.2)
Total Protein: 7.3 g/dL (ref 6.5–8.1)

## 2022-12-17 MED ORDER — HEPARIN SOD (PORK) LOCK FLUSH 100 UNIT/ML IV SOLN
500.0000 [IU] | Freq: Once | INTRAVENOUS | Status: AC | PRN
  Administered 2022-12-17: 500 [IU]
  Filled 2022-12-17: qty 5

## 2022-12-17 MED ORDER — SODIUM CHLORIDE 0.9 % IV SOLN
Freq: Once | INTRAVENOUS | Status: AC
  Filled 2022-12-17: qty 250

## 2022-12-17 MED ORDER — SODIUM CHLORIDE 0.9 % IV SOLN
200.0000 mg | Freq: Once | INTRAVENOUS | Status: AC
  Administered 2022-12-17: 200 mg via INTRAVENOUS
  Filled 2022-12-17: qty 200

## 2022-12-17 NOTE — Assessment & Plan Note (Signed)
# [  april2021] Stage IV metastatic non-small cell lung cancer favor adeno; mets to bone; liver.   Oligo-metastatic progression-s/p RT right upper lobe mass [s/p dec 15th, 2022].  Currently on Keytruda maintenance.   # MARCH 16th, 2024- CT CAP-scarring noted in the right upper thorax;  no evidence of local tumor recurrence;  Stable sclerotic osseous metastases; NO new or progressive metastatic disease in the chest, abdomen or pelvis.  Stable. Will plan scan in July, 2024.   # proceed with [place] maintainance today. Labs today reviewed;  acceptable for treatment today.     # Bone metastases- [[facility], Dr.Parks]. Continue multivitamin with calcium plus vitamin D 3 times a day.  S/p extractions. jan 19th, 2024; s/p dental clearance [April 2024]. vit D 25 OH- march 2024- 70. Resume Zometa 3 mg q 3 M. stable   # Hypothyroidism- [ Mid sep 2022]  Normal T3-T4.  Currently on 50 mcg a day; MARCH 2023- WNL-stable   # COPD-. On 2-3 lit O2; Trelegy.Clinically-stable    HOLD ZOMETA- last 11/26/2022 q 4M;    # DISPOSITION:  # Keytruda today;  # follow up in 3 weeks-MD;  port/labs- cbc/cmp;  Keytruda;-Dr.B

## 2022-12-17 NOTE — Patient Instructions (Signed)

## 2022-12-17 NOTE — Progress Notes (Signed)
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   # 2021- MAY 26th-Keytrda x2 cycles; [borderline PS] July 7th-carbo Alimta Keytruda cycle #1  #November 2022-oligometastatic progression-right upper lobe subpleural involvement with; s/p radiation finished December 15.  Temporarily held [place].  #Jan fourth 2023-restarted Keytruda.  # NGS/MOLECULAR TESTS:   # PALLIATIVE CARE EVALUATION:  # PAIN MANAGEMENT:    DIAGNOSIS:   STAGE:         ;  GOALS:  CURRENT/MOST RECENT THERAPY :     Cancer of upper lobe of right lung (HCC)  11/23/2019 Initial Diagnosis   Cancer of upper lobe of right lung (HCC)   12/16/2019 -  Chemotherapy   Patient is on Treatment Plan : LUNG Carboplatin (5) + Pemetrexed (500) + Pembrolizumab (200) D1 q21d Induction x 4 cycles / Maintenance Pemetrexed (500) + Pembrolizumab (200) D1 q21d     12/21/2019 - 02/26/2022 Chemotherapy   Patient is on Treatment Plan : LUNG CARBOplatin / Pemetrexed / Pembrolizumab q21d Induction x 4 cycles / Maintenance Pemetrexed + Pembrolizumab      HISTORY OF PRESENTING ILLNESS: Thin built frail appearing male patient.  No acute distress. Alone.  Nasal cannula oxygen.  Ambulating independently.    Cody [name] 67 y.o.  male patient  with metastatic non-small cell lung cancer; cord compression status post decompressive surgery; currently on Keytruda maintenance-is here for follow-up.  Patient expresses concerns of chronic SOB.  Not any worse.Oxygen dependent. Some fatigue. Pt is able to due all his activities of daily living.   Appetite is almost normal. Denies pain. Denies any dizziness.    Review of Systems  Constitutional:  Positive for malaise/fatigue. Negative for chills, diaphoresis and fever.  HENT:  Negative for nosebleeds and sore throat.   Eyes:  Negative for double vision.  Respiratory:  Positive for shortness of breath. Negative for cough, hemoptysis, sputum production and wheezing.   Cardiovascular:  Negative for chest pain, palpitations, orthopnea and leg swelling.  Gastrointestinal:  Negative for abdominal pain, blood in stool, constipation, heartburn, melena, nausea and vomiting.  Genitourinary:  Negative for dysuria, frequency and urgency.  Musculoskeletal:  Positive for back pain and joint pain.  Skin: Negative.  Negative for itching and rash.  Neurological:  Negative for dizziness, tingling, focal weakness, weakness and headaches.  Endo/Heme/Allergies:  Does not bruise/bleed easily.  Psychiatric/Behavioral:  Negative for depression. The patient is not nervous/anxious and does not have insomnia.      MEDICAL HISTORY:  Past Medical History:  Diagnosis Date   Allergy    Cancer of upper lobe of right lung (HCC) 10/2019   COPD (chronic obstructive pulmonary disease) (HCC)    Prostate enlargement     SURGICAL HISTORY: Past Surgical History:  Procedure Laterality Date   HERNIA REPAIR Bilateral 1982   HERNIA REPAIR Right 1994   IR IMAGING GUIDED PORT INSERTION  02/24/2020  LAMINECTOMY FOR EXCISION / EVACUATION INTRASPINAL LESION  11/07/2019   T1-T 6 at [facility]     SOCIAL HISTORY: Social History   Socioeconomic History   Marital status: Married    Spouse name: [name]    Number of children: 4    Years of education: Not on file   Highest education level: Some college, no degree  Occupational History   Occupation: dispatch and delivery   Tobacco Use   Smoking status: Former    Packs/day: 2.00    Years: 30.00    Additional pack years: 0.00    Total pack years: 60.00    Types: Cigarettes    Quit date: 07/29/2011    Years since quitting: 11.3   Smokeless tobacco: Never   Tobacco comments:    pt vapes  Vaping Use   Vaping Use: Not on file  Substance and Sexual Activity   Alcohol use: Yes    Alcohol/week: 5.0 standard drinks of alcohol    Types: 5 Standard drinks or equivalent per week    Comment: 1-2/day   Drug use: No   Sexual activity: Not Currently    Partners: Female  Other Topics Concern   Not on file  Social History Narrative   Worked in warehouse/ quit smoking in 2015; beer 1/day; in [place]; with wife.    Social Determinants of Health   Financial Resource Strain: Low Risk  (11/15/2022)   Overall Financial Resource Strain (CARDIA)    Difficulty of Paying Living Expenses: Not very hard  Food Insecurity: No Food Insecurity (11/15/2022)   Hunger Vital Sign    Worried About Running Out of Food in the Last Year: Never true    Ran Out of Food in the Last Year: Never true  Transportation Needs: No Transportation Needs (11/15/2022)   PRAPARE - Administrator, Civil Service (Medical): No    Lack of Transportation (Non-Medical): No  Physical Activity: Unknown (11/15/2022)   Exercise Vital Sign    Days of Exercise per Week: Patient declined    Minutes of Exercise per Session: Not on file  Stress: No Stress Concern Present (11/15/2022)   [employer] of Occupational Health - Occupational Stress Questionnaire    Feeling of Stress : Only a little  Social Connections: Unknown (11/15/2022)   Social Connection and Isolation Panel [NHANES]    Frequency of Communication with Friends and Family: Three times a week    Frequency of Social Gatherings with Friends  and Family: Patient declined    Attends Religious Services: More than 4 times per year    Active Member of [employer] or Organizations: Not on file    Attends Banker Meetings: Not on file    Marital Status: Married  Intimate Partner Violence: Not At Risk (10/21/2019)   Humiliation, Afraid, Rape, and Kick questionnaire    Fear of Current or Ex-Partner: No    Emotionally Abused: No    Physically Abused: No    Sexually Abused: No    FAMILY HISTORY: Family History  Problem Relation Age of Onset   Heart disease Father    CVA Mother    Lung cancer Sister    Lung cancer Brother     ALLERGIES:  has No Known Allergies.  MEDICATIONS:  Current Outpatient Medications  Medication Sig Dispense Refill   acetaminophen (TYLENOL) 500 MG tablet Take 500-1,000 mg by mouth every 6 (six) hours as needed for mild pain or fever.      albuterol (VENTOLIN HFA) 108 (  90 Base) MCG/ACT inhaler Inhale 2 puffs into the lungs every 4 (four) hours as needed for wheezing or shortness of breath. 18 g 0   Calcium Carb-Cholecalciferol (OYSTER SHELL CALCIUM) 500-400 MG-UNIT TABS Take 500 tablets by mouth.     escitalopram (LEXAPRO) 5 MG tablet TAKE ONE TABLET BY MOUTH EVERY MORNING 90 tablet 0   levothyroxine (SYNTHROID) 50 MCG tablet TAKE 1 TABLET BY MOUTH DAILY BEFORE BREAKFAST 90 tablet 2   lidocaine-prilocaine (EMLA) cream Apply 1 application topically as needed. Apply small amount to port site at least 1 hour prior to it being accessed, cover with plastic wrap 30 g 1   Loratadine 10 MG CAPS      Multiple Vitamin (MULTIVITAMIN) tablet Take 1 tablet by mouth daily.     pravastatin (PRAVACHOL) 20 MG tablet Take 1 tablet (20 mg total) by mouth daily. 90 tablet 3   QUEtiapine (SEROQUEL) 25 MG tablet TAKE 1 TABLET BY MOUTH AT BEDTIME 90 tablet 1   No current facility-administered medications for this visit.   Facility-Administered Medications Ordered in Other Visits  Medication Dose Route Frequency Provider  Last Rate Last Admin   heparin lock flush 100 UNIT/ML injection            heparin lock flush 100 unit/mL  500 Units Intracatheter Once PRN Earna Coder, MD       pembrolizumab [facility]) 200 mg in sodium chloride 0.9 % 50 mL chemo infusion  200 mg Intravenous Once Earna Coder, MD 116 mL/hr at 12/17/22 1013 200 mg at 12/17/22 1013      .  PHYSICAL EXAMINATION: ECOG PERFORMANCE STATUS: 3 - Symptomatic, >50% confined to bed  Vitals:   12/17/22 0838  BP: (!) 145/71  Pulse: 95  Temp: (!) 96.5 F (35.8 C)  SpO2: 97%   Filed Weights   12/17/22 0838  Weight: 126 lb 3.2 oz (57.2 kg)     Physical Exam HENT:     Head: Normocephalic and atraumatic.     Mouth/Throat:     Pharynx: No oropharyngeal exudate.  Eyes:     Pupils: Pupils are equal, round, and reactive to light.  Cardiovascular:     Rate and Rhythm: Normal rate and regular rhythm.  Pulmonary:     Effort: No respiratory distress.     Breath sounds: No wheezing.     Comments: Decreased air entry bilaterally. Abdominal:     General: Bowel sounds are normal. There is no distension.     Palpations: Abdomen is soft. There is no mass.     Tenderness: There is no abdominal tenderness. There is no guarding or rebound.  Musculoskeletal:        General: No tenderness. Normal range of motion.     Cervical back: Normal range of motion and neck supple.  Skin:    General: Skin is warm.  Neurological:     Mental Status: He is alert and oriented to person, place, and time.  Psychiatric:        Mood and Affect: Affect normal.      LABORATORY DATA:  I have reviewed the data as listed Lab Results  Component Value Date   WBC 5.9 12/17/2022   HGB 12.2 (L) 12/17/2022   HCT 39.4 12/17/2022   MCV 99.0 12/17/2022   PLT 145 (L) 12/17/2022   Recent Labs    11/05/22 1002 11/26/22 1438 12/17/22 0842  NA 139 135 138  K 4.1 3.8 4.1  CL 90* 87* 88*  CO2 42* 38* 38*  GLUCOSE 98 98 91  BUN 11 9 9   CREATININE  0.63 0.51* 0.59*  CALCIUM 9.1 8.7* 9.0  GFRNONAA >60 >60 >60  PROT 7.5 7.3 7.3  ALBUMIN 4.0 3.9 3.9  AST 21 20 21   ALT 14 12 13   ALKPHOS 53 56 52  BILITOT 0.5 0.4 0.5    RADIOGRAPHIC STUDIES: I have personally reviewed the radiological images as listed and agreed with the findings in the report. No results found.   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   Oligo-metastatic progression-s/p RT right upper lobe mass [s/p dec 15th, 2022].  Currently on Keytruda maintenance.   # MARCH 16th, 2024- CT CAP-scarring noted in the right upper thorax;  no evidence of local tumor recurrence;  Stable sclerotic osseous metastases; NO new or progressive metastatic disease in the chest, abdomen or pelvis.  Stable. Will plan scan in July, 2024.   # proceed with [place] maintainance today. Labs today reviewed;  acceptable for treatment today.     # Bone metastases- [[facility], Dr.Parks]. Continue multivitamin with calcium plus vitamin D 3 times a day.  S/p extractions. jan 19th, 2024; s/p dental clearance [April 2024]. vit D 25 OH- march 2024- 70. Resume Zometa 3 mg q 3 M. stable   # Hypothyroidism- [ Mid sep 2022]  Normal T3-T4.  Currently on 50 mcg a day; MARCH 2023- WNL-stable   # COPD-. On 2-3 lit O2; Trelegy.Clinically-stable    HOLD ZOMETA- last 11/26/2022 q 13M;    # DISPOSITION:  # Keytruda today;  # follow up in 3 weeks-MD;  port/labs- cbc/cmp;  Keytruda;-Dr.B    All questions were answered. The patient knows to call the clinic with any problems, questions or concerns.    Earna Coder, MD 12/17/2022 10:17 AM

## 2022-12-17 NOTE — Patient Instructions (Signed)
[facility]  Discharge Instructions: Thank you for choosing [facility] to provide your oncology and hematology care.  If you have a lab appointment with the Cancer Center, please go directly to the Cancer Center and check in at the registration area.  Wear comfortable clothing and clothing appropriate for easy access to any Portacath or PICC line.   We strive to give you quality time with your provider. You may need to reschedule your appointment if you arrive late (15 or more minutes).  Arriving late affects you and other patients whose appointments are after yours.  Also, if you miss three or more appointments without notifying the office, you may be dismissed from the clinic at the provider's discretion.      For prescription refill requests, have your pharmacy contact our office and allow 72 hours for refills to be completed.    Today you received the following chemotherapy and/or immunotherapy agents- Keytruda      To help prevent nausea and vomiting after your treatment, we encourage you to take your nausea medication as directed.  BELOW ARE SYMPTOMS THAT SHOULD BE REPORTED IMMEDIATELY: *FEVER GREATER THAN 100.4 F (38 C) OR HIGHER *CHILLS OR SWEATING *NAUSEA AND VOMITING THAT IS NOT CONTROLLED WITH YOUR NAUSEA MEDICATION *UNUSUAL SHORTNESS OF BREATH *UNUSUAL BRUISING OR BLEEDING *URINARY PROBLEMS (pain or burning when urinating, or frequent urination) *BOWEL PROBLEMS (unusual diarrhea, constipation, pain near the anus) TENDERNESS IN MOUTH AND THROAT WITH OR WITHOUT PRESENCE OF ULCERS (sore throat, sores in mouth, or a toothache) UNUSUAL RASH, SWELLING OR PAIN  UNUSUAL VAGINAL DISCHARGE OR ITCHING   Items with * indicate a potential emergency and should be followed up as soon as possible or go to the Emergency Department if any problems should occur.  Please show the CHEMOTHERAPY ALERT CARD or IMMUNOTHERAPY ALERT CARD at check-in to  the Emergency Department and triage nurse.  Should you have questions after your visit or need to cancel or reschedule your appointment, please contact [facility]  [phone] and follow the prompts.  Office hours are 8:00 a.m. to 4:30 p.m. Monday - Friday. Please note that voicemails left after 4:00 p.m. may not be returned until the following business day.  We are closed weekends and major holidays. You have access to a nurse at all times for urgent questions. Please call the main number to the clinic [phone] and follow the prompts.  For any non-urgent questions, you may also contact your provider using MyChart. We now offer e-Visits for anyone 18 and older to request care online for non-urgent symptoms. For details visit [email or web address].   Also download the MyChart app! Go to the app store, search "MyChart", open the app, select [facility], and log in with your MyChart username and password.   

## 2022-12-17 NOTE — Progress Notes (Signed)
No concerns today 

## 2022-12-31 DIAGNOSIS — J9601 Acute respiratory failure with hypoxia: Secondary | ICD-10-CM | POA: Diagnosis not present

## 2022-12-31 DIAGNOSIS — J441 Chronic obstructive pulmonary disease with (acute) exacerbation: Secondary | ICD-10-CM | POA: Diagnosis not present

## 2023-01-07 ENCOUNTER — Inpatient Hospital Stay: Payer: Medicare HMO

## 2023-01-07 ENCOUNTER — Inpatient Hospital Stay: Payer: Medicare HMO | Attending: Internal Medicine | Admitting: Internal Medicine

## 2023-01-07 ENCOUNTER — Encounter: Payer: Self-pay | Admitting: Internal Medicine

## 2023-01-07 ENCOUNTER — Other Ambulatory Visit: Payer: Self-pay

## 2023-01-07 VITALS — BP 168/76 | HR 93 | Temp 97.1°F | Ht 69.0 in | Wt 125.6 lb

## 2023-01-07 DIAGNOSIS — Z9981 Dependence on supplemental oxygen: Secondary | ICD-10-CM | POA: Diagnosis not present

## 2023-01-07 DIAGNOSIS — Z823 Family history of stroke: Secondary | ICD-10-CM | POA: Insufficient documentation

## 2023-01-07 DIAGNOSIS — Z7189 Other specified counseling: Secondary | ICD-10-CM | POA: Diagnosis not present

## 2023-01-07 DIAGNOSIS — N400 Enlarged prostate without lower urinary tract symptoms: Secondary | ICD-10-CM | POA: Insufficient documentation

## 2023-01-07 DIAGNOSIS — Z5112 Encounter for antineoplastic immunotherapy: Secondary | ICD-10-CM | POA: Insufficient documentation

## 2023-01-07 DIAGNOSIS — Z801 Family history of malignant neoplasm of trachea, bronchus and lung: Secondary | ICD-10-CM | POA: Insufficient documentation

## 2023-01-07 DIAGNOSIS — C3411 Malignant neoplasm of upper lobe, right bronchus or lung: Secondary | ICD-10-CM

## 2023-01-07 DIAGNOSIS — Z923 Personal history of irradiation: Secondary | ICD-10-CM | POA: Insufficient documentation

## 2023-01-07 DIAGNOSIS — Z79899 Other long term (current) drug therapy: Secondary | ICD-10-CM | POA: Insufficient documentation

## 2023-01-07 DIAGNOSIS — Z87891 Personal history of nicotine dependence: Secondary | ICD-10-CM | POA: Diagnosis not present

## 2023-01-07 DIAGNOSIS — C787 Secondary malignant neoplasm of liver and intrahepatic bile duct: Secondary | ICD-10-CM | POA: Diagnosis not present

## 2023-01-07 DIAGNOSIS — R54 Age-related physical debility: Secondary | ICD-10-CM | POA: Diagnosis not present

## 2023-01-07 DIAGNOSIS — C7951 Secondary malignant neoplasm of bone: Secondary | ICD-10-CM | POA: Diagnosis not present

## 2023-01-07 DIAGNOSIS — Z79620 Long term (current) use of immunosuppressive biologic: Secondary | ICD-10-CM | POA: Insufficient documentation

## 2023-01-07 DIAGNOSIS — E039 Hypothyroidism, unspecified: Secondary | ICD-10-CM | POA: Insufficient documentation

## 2023-01-07 DIAGNOSIS — Z8249 Family history of ischemic heart disease and other diseases of the circulatory system: Secondary | ICD-10-CM | POA: Insufficient documentation

## 2023-01-07 LAB — COMPREHENSIVE METABOLIC PANEL
ALT: 12 U/L (ref 0–44)
AST: 22 U/L (ref 15–41)
Albumin: 4 g/dL (ref 3.5–5.0)
Alkaline Phosphatase: 50 U/L (ref 38–126)
Anion gap: 9 (ref 5–15)
BUN: 12 mg/dL (ref 8–23)
CO2: 43 mmol/L — ABNORMAL HIGH (ref 22–32)
Calcium: 8.2 mg/dL — ABNORMAL LOW (ref 8.9–10.3)
Chloride: 89 mmol/L — ABNORMAL LOW (ref 98–111)
Creatinine, Ser: 0.56 mg/dL — ABNORMAL LOW (ref 0.61–1.24)
GFR, Estimated: >60 mL/min (ref >60)
Glucose, Bld: 96 mg/dL (ref 70–99)
Potassium: 4.2 mmol/L (ref 3.5–5.1)
Sodium: 141 mmol/L (ref 135–145)
Total Bilirubin: 0.6 mg/dL (ref 0.3–1.2)
Total Protein: 7.3 g/dL (ref 6.5–8.1)

## 2023-01-07 LAB — CBC WITH DIFFERENTIAL/PLATELET
Abs Immature Granulocytes: 0.01 10*3/uL (ref 0.00–0.07)
Basophils Absolute: 0 10*3/uL (ref 0.0–0.1)
Basophils Relative: 1 %
Eosinophils Absolute: 0.6 10*3/uL — ABNORMAL HIGH (ref 0.0–0.5)
Eosinophils Relative: 9 %
HCT: 39.9 % (ref 39.0–52.0)
Hemoglobin: 12.4 g/dL — ABNORMAL LOW (ref 13.0–17.0)
Immature Granulocytes: 0 %
Lymphocytes Relative: 21 %
Lymphs Abs: 1.3 10*3/uL (ref 0.7–4.0)
MCH: 30.7 pg (ref 26.0–34.0)
MCHC: 31.1 g/dL (ref 30.0–36.0)
MCV: 98.8 fL (ref 80.0–100.0)
Monocytes Absolute: 0.6 10*3/uL (ref 0.1–1.0)
Monocytes Relative: 10 %
Neutro Abs: 3.8 10*3/uL (ref 1.7–7.7)
Neutrophils Relative %: 59 %
Platelets: 145 10*3/uL — ABNORMAL LOW (ref 150–400)
RBC: 4.04 MIL/uL — ABNORMAL LOW (ref 4.22–5.81)
RDW: 12.1 % (ref 11.5–15.5)
WBC: 6.3 10*3/uL (ref 4.0–10.5)
nRBC: 0 % (ref 0.0–0.2)

## 2023-01-07 LAB — TSH: TSH: 3.3 u[IU]/mL (ref 0.350–4.500)

## 2023-01-07 MED ORDER — SODIUM CHLORIDE 0.9 % IV SOLN
200.0000 mg | Freq: Once | INTRAVENOUS | Status: AC
  Administered 2023-01-07: 200 mg via INTRAVENOUS
  Filled 2023-01-07: qty 200

## 2023-01-07 MED ORDER — SODIUM CHLORIDE 0.9 % IV SOLN
Freq: Once | INTRAVENOUS | Status: AC
  Filled 2023-01-07: qty 250

## 2023-01-07 MED ORDER — HEPARIN SOD (PORK) LOCK FLUSH 100 UNIT/ML IV SOLN
500.0000 [IU] | Freq: Once | INTRAVENOUS | Status: AC | PRN
  Administered 2023-01-07: 500 [IU]
  Filled 2023-01-07: qty 5

## 2023-01-07 NOTE — Progress Notes (Signed)
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   # 2021- MAY 26th-Keytrda x2 cycles; [borderline PS] July 7th-carbo Alimta Keytruda cycle #1  #November 2022-oligometastatic progression-right upper lobe subpleural involvement with; s/p radiation finished December 15.  Temporarily held [place].  #Jan fourth 2023-restarted Keytruda.  # NGS/MOLECULAR TESTS:   # PALLIATIVE CARE EVALUATION:  # PAIN MANAGEMENT:    DIAGNOSIS:   STAGE:         ;  GOALS:  CURRENT/MOST RECENT THERAPY :     Cancer of upper lobe of right lung (HCC)  11/23/2019 Initial Diagnosis   Cancer of upper lobe of right lung (HCC)   12/16/2019 -  Chemotherapy   Patient is on Treatment Plan : LUNG Carboplatin (5) + Pemetrexed (500) + Pembrolizumab (200) D1 q21d Induction x 4 cycles / Maintenance Pemetrexed (500) + Pembrolizumab (200) D1 q21d     12/21/2019 - 02/26/2022 Chemotherapy   Patient is on Treatment Plan : LUNG CARBOplatin / Pemetrexed / Pembrolizumab q21d Induction x 4 cycles / Maintenance Pemetrexed + Pembrolizumab      HISTORY OF PRESENTING ILLNESS: Thin built frail appearing male patient.  No acute distress. Alone.  Nasal cannula oxygen.  Ambulating independently.    Cody [name] 67 y.o.  male patient  with metastatic non-small cell lung cancer; cord compression status post decompressive surgery; currently on Keytruda maintenance-is here for follow-up.  Patient  continues to have chronic SOB.  Not any worse.Oxygen dependent. Some fatigue. Pt is able to due all his activities of daily living.   Appetite is almost normal. Denies pain. Denies any dizziness.    Review of Systems  Constitutional:  Positive for malaise/fatigue. Negative for chills, diaphoresis and fever.  HENT:  Negative for nosebleeds and sore throat.   Eyes:  Negative for double vision.  Respiratory:  Positive for shortness of breath. Negative for cough, hemoptysis, sputum production and wheezing.   Cardiovascular:  Negative for chest pain, palpitations, orthopnea and leg swelling.  Gastrointestinal:  Negative for abdominal pain, blood in stool, constipation, heartburn, melena, nausea and vomiting.  Genitourinary:  Negative for dysuria, frequency and urgency.  Musculoskeletal:  Positive for back pain and joint pain.  Skin: Negative.  Negative for itching and rash.  Neurological:  Negative for dizziness, tingling, focal weakness, weakness and headaches.  Endo/Heme/Allergies:  Does not bruise/bleed easily.  Psychiatric/Behavioral:  Negative for depression. The patient is not nervous/anxious and does not have insomnia.      MEDICAL HISTORY:  Past Medical History:  Diagnosis Date   Allergy    Cancer of upper lobe of right lung (HCC) 10/2019   COPD (chronic obstructive pulmonary disease) (HCC)    Prostate enlargement     SURGICAL HISTORY: Past Surgical History:  Procedure Laterality Date   HERNIA REPAIR Bilateral 1982   HERNIA REPAIR Right 1994   IR IMAGING GUIDED PORT INSERTION  02/24/2020  LAMINECTOMY FOR EXCISION / EVACUATION INTRASPINAL LESION  11/07/2019   T1-T 6 at [facility]     SOCIAL HISTORY: Social History   Socioeconomic History   Marital status: Married    Spouse name: [name]    Number of children: 4    Years of education: Not on file   Highest education level: Some college, no degree  Occupational History   Occupation: dispatch and delivery   Tobacco Use   Smoking status: Former    Packs/day: 2.00    Years: 30.00    Additional pack years: 0.00    Total pack years: 60.00    Types: Cigarettes    Quit date: 07/29/2011    Years since quitting: 11.4   Smokeless tobacco: Never   Tobacco comments:    pt vapes  Vaping Use   Vaping Use: Not on file  Substance and Sexual Activity   Alcohol use: Yes    Alcohol/week: 5.0 standard drinks of alcohol    Types: 5 Standard drinks or equivalent per week    Comment: 1-2/day   Drug use: No   Sexual activity: Not Currently    Partners: Female  Other Topics Concern   Not on file  Social History Narrative   Worked in warehouse/ quit smoking in 2015; beer 1/day; in [place]; with wife.    Social Determinants of Health   Financial Resource Strain: Low Risk  (11/15/2022)   Overall Financial Resource Strain (CARDIA)    Difficulty of Paying Living Expenses: Not very hard  Food Insecurity: No Food Insecurity (11/15/2022)   Hunger Vital Sign    Worried About Running Out of Food in the Last Year: Never true    Ran Out of Food in the Last Year: Never true  Transportation Needs: No Transportation Needs (11/15/2022)   PRAPARE - Administrator, Civil Service (Medical): No    Lack of Transportation (Non-Medical): No  Physical Activity: Unknown (11/15/2022)   Exercise Vital Sign    Days of Exercise per Week: Patient declined    Minutes of Exercise per Session: Not on file  Stress: No Stress Concern Present (11/15/2022)   [employer] of Occupational Health - Occupational Stress Questionnaire    Feeling of Stress : Only a little  Social Connections: Unknown (11/15/2022)   Social Connection and Isolation Panel [NHANES]    Frequency of Communication with Friends and Family: Three times a week    Frequency of Social Gatherings with Friends  and Family: Patient declined    Attends Religious Services: More than 4 times per year    Active Member of [employer] or Organizations: Not on file    Attends Banker Meetings: Not on file    Marital Status: Married  Intimate Partner Violence: Not At Risk (10/21/2019)   Humiliation, Afraid, Rape, and Kick questionnaire    Fear of Current or Ex-Partner: No    Emotionally Abused: No    Physically Abused: No    Sexually Abused: No    FAMILY HISTORY: Family History  Problem Relation Age of Onset   Heart disease Father    CVA Mother    Lung cancer Sister    Lung cancer Brother     ALLERGIES:  has No Known Allergies.  MEDICATIONS:  Current Outpatient Medications  Medication Sig Dispense Refill   acetaminophen (TYLENOL) 500 MG tablet Take 500-1,000 mg by mouth every 6 (six) hours as needed for mild pain or fever.      albuterol (VENTOLIN HFA) 108 (  90 Base) MCG/ACT inhaler Inhale 2 puffs into the lungs every 4 (four) hours as needed for wheezing or shortness of breath. 18 g 0   Calcium Carb-Cholecalciferol (OYSTER SHELL CALCIUM) 500-400 MG-UNIT TABS Take 500 tablets by mouth.     escitalopram (LEXAPRO) 5 MG tablet TAKE ONE TABLET BY MOUTH EVERY MORNING 90 tablet 0   levothyroxine (SYNTHROID) 50 MCG tablet TAKE 1 TABLET BY MOUTH DAILY BEFORE BREAKFAST 90 tablet 2   lidocaine-prilocaine (EMLA) cream Apply 1 application topically as needed. Apply small amount to port site at least 1 hour prior to it being accessed, cover with plastic wrap 30 g 1   Loratadine 10 MG CAPS      Multiple Vitamin (MULTIVITAMIN) tablet Take 1 tablet by mouth daily.     pravastatin (PRAVACHOL) 20 MG tablet Take 1 tablet (20 mg total) by mouth daily. 90 tablet 3   QUEtiapine (SEROQUEL) 25 MG tablet TAKE 1 TABLET BY MOUTH AT BEDTIME 90 tablet 1   No current facility-administered medications for this visit.   Facility-Administered Medications Ordered in Other Visits  Medication Dose Route Frequency Provider  Last Rate Last Admin   heparin lock flush 100 UNIT/ML injection               .  PHYSICAL EXAMINATION: ECOG PERFORMANCE STATUS: 3 - Symptomatic, >50% confined to bed  Vitals:   01/07/23 0849  BP: (!) 168/76  Pulse: 93  Temp: (!) 97.1 F (36.2 C)  SpO2: 94%   Filed Weights   01/07/23 0849  Weight: 125 lb 9.6 oz (57 kg)     Physical Exam HENT:     Head: Normocephalic and atraumatic.     Mouth/Throat:     Pharynx: No oropharyngeal exudate.  Eyes:     Pupils: Pupils are equal, round, and reactive to light.  Cardiovascular:     Rate and Rhythm: Normal rate and regular rhythm.  Pulmonary:     Effort: No respiratory distress.     Breath sounds: No wheezing.     Comments: Decreased air entry bilaterally. Abdominal:     General: Bowel sounds are normal. There is no distension.     Palpations: Abdomen is soft. There is no mass.     Tenderness: There is no abdominal tenderness. There is no guarding or rebound.  Musculoskeletal:        General: No tenderness. Normal range of motion.     Cervical back: Normal range of motion and neck supple.  Skin:    General: Skin is warm.  Neurological:     Mental Status: He is alert and oriented to person, place, and time.  Psychiatric:        Mood and Affect: Affect normal.      LABORATORY DATA:  I have reviewed the data as listed Lab Results  Component Value Date   WBC 5.9 12/17/2022   HGB 12.2 (L) 12/17/2022   HCT 39.4 12/17/2022   MCV 99.0 12/17/2022   PLT 145 (L) 12/17/2022   Recent Labs    11/26/22 1438 12/17/22 0842 01/07/23 0848  NA 135 138 141  K 3.8 4.1 4.2  CL 87* 88* 89*  CO2 38* 38* 43*  GLUCOSE 98 91 96  BUN 9 9 12   CREATININE 0.51* 0.59* 0.56*  CALCIUM 8.7* 9.0 8.2*  GFRNONAA >60 >60 >60  PROT 7.3 7.3 7.3  ALBUMIN 3.9 3.9 4.0  AST 20 21 22   ALT 12 13 12   ALKPHOS 56 52 50  BILITOT 0.4 0.5 0.6    RADIOGRAPHIC STUDIES: I have personally reviewed the radiological images as listed and agreed with  the findings in the report. No results found.   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   Oligo-metastatic progression-s/p RT right upper lobe mass [s/p dec 15th, 2022].  Currently on Keytruda maintenance.   # MARCH 16th, 2024- CT CAP-scarring noted in the right upper thorax;  no evidence of local tumor recurrence;  Stable sclerotic osseous metastases; NO new or progressive metastatic disease in the chest, abdomen or pelvis.  Stable. Will plan scan in July, 2024; will order today.   # proceed with [place] maintainance today. Labs today reviewed;  acceptable for treatment today.     # Bone metastases- [[facility], Dr.Parks]. Continue multivitamin with calcium plus vitamin D 3 times a day.  S/p extractions. jan 19th, 2024; s/p dental clearance [April 2024]. vit D 25 [place]- march 2024- 70. Currently on Zometa 3 mg q 3 M. stable   # Hypothyroidism- [ Mid sep 2022]  Normal T3-T4.  Currently on 50 mcg a day; MARCH 2023[facility]   # COPD-. On 2-3 lit O2; Trelegy.Clinically-stable    HOLD ZOMETA- last 11/26/2022 q 41M;    # DISPOSITION:  # Add TSH to labs today-  # Keytruda today;  # follow up in 3 weeks-Dr.Rao-  port/labs- cbc/cmp;  Keytruda; CT CAP- prior--Dr.B    All questions were answered. The patient knows to call the clinic with any problems, questions or concerns.    Earna Coder, MD 01/07/2023 9:40 AM

## 2023-01-07 NOTE — Assessment & Plan Note (Signed)
# [  april2021] Stage IV metastatic non-small cell lung cancer favor adeno; mets to bone; liver.   Oligo-metastatic progression-s/p RT right upper lobe mass [s/p dec 15th, 2022].  Currently on Keytruda maintenance.   # MARCH 16th, 2024- CT CAP-scarring noted in the right upper thorax;  no evidence of local tumor recurrence;  Stable sclerotic osseous metastases; NO new or progressive metastatic disease in the chest, abdomen or pelvis.  Stable. Will plan scan in July, 2024; will order today.   # proceed with [place] maintainance today. Labs today reviewed;  acceptable for treatment today.     # Bone metastases- [[facility], Dr.Parks]. Continue multivitamin with calcium plus vitamin D 3 times a day.  S/p extractions. jan 19th, 2024; s/p dental clearance [April 2024]. vit D 25 [place]- march 2024- 70. Currently on Zometa 3 mg q 3 M. stable   # Hypothyroidism- [ Mid sep 2022]  Normal T3-T4.  Currently on 50 mcg a day; MARCH 2023[facility]   # COPD-. On 2-3 lit O2; Trelegy.Clinically-stable    HOLD ZOMETA- last 11/26/2022 q 37M;    # DISPOSITION:  # Add TSH to labs today-  # Keytruda today;  # follow up in 3 weeks-Dr.Rao-  port/labs- cbc/cmp;  Keytruda; CT CAP- prior--Dr.B

## 2023-01-07 NOTE — Progress Notes (Signed)
No concerns today 

## 2023-01-20 ENCOUNTER — Telehealth: Payer: Self-pay | Admitting: *Deleted

## 2023-01-20 NOTE — Progress Notes (Signed)
  Care Coordination   Note   01/20/2023 Name: Cody [name] MRN: [id number] DOB: 20-Nov-1955  Cody [name] is a 67 y.o. year old male who sees Alba Cory, MD for primary care. I reached out to [name] by phone today to offer care coordination services.  Mr. Victorio was given information about Care Coordination services today including:   The Care Coordination services include support from the care team which includes your Nurse Coordinator, Clinical Social Worker, or Pharmacist.  The Care Coordination team is here to help remove barriers to the health concerns and goals most important to you. Care Coordination services are voluntary, and the patient may decline or stop services at any time by request to their care team member.   Care Coordination Consent Status: Patient agreed to services and verbal consent obtained.   Follow up plan:  Telephone appointment with care coordination team member scheduled for:  02/04/2023  Encounter Outcome:  Pt. Scheduled   Burman Nieves, CCMA Care Coordination Care Guide Direct Dial: [phone]

## 2023-01-21 ENCOUNTER — Ambulatory Visit
Admission: RE | Admit: 2023-01-21 | Discharge: 2023-01-21 | Disposition: A | Discharge status: Home / Self Care | Payer: Medicare HMO | Source: Ambulatory Visit | Attending: Internal Medicine | Admitting: Internal Medicine

## 2023-01-21 DIAGNOSIS — K5730 Diverticulosis of large intestine without perforation or abscess without bleeding: Secondary | ICD-10-CM | POA: Diagnosis not present

## 2023-01-21 DIAGNOSIS — C3411 Malignant neoplasm of upper lobe, right bronchus or lung: Secondary | ICD-10-CM | POA: Diagnosis not present

## 2023-01-21 DIAGNOSIS — J439 Emphysema, unspecified: Secondary | ICD-10-CM | POA: Diagnosis not present

## 2023-01-21 DIAGNOSIS — C3490 Malignant neoplasm of unspecified part of unspecified bronchus or lung: Secondary | ICD-10-CM | POA: Diagnosis not present

## 2023-01-21 DIAGNOSIS — I700 Atherosclerosis of aorta: Secondary | ICD-10-CM | POA: Diagnosis not present

## 2023-01-21 MED ORDER — IOHEXOL 300 MG/ML  SOLN
80.0000 mL | Freq: Once | INTRAMUSCULAR | Status: AC | PRN
  Administered 2023-01-21: 80 mL via INTRAVENOUS

## 2023-01-28 ENCOUNTER — Inpatient Hospital Stay (HOSPITAL_BASED_OUTPATIENT_CLINIC_OR_DEPARTMENT_OTHER): Payer: Medicare HMO | Admitting: Medical Oncology

## 2023-01-28 ENCOUNTER — Inpatient Hospital Stay: Payer: Medicare HMO

## 2023-01-28 ENCOUNTER — Encounter: Payer: Self-pay | Admitting: Medical Oncology

## 2023-01-28 ENCOUNTER — Inpatient Hospital Stay: Payer: Medicare HMO | Attending: Internal Medicine

## 2023-01-28 VITALS — BP 137/87 | HR 77 | Temp 97.1°F | Wt 124.9 lb

## 2023-01-28 DIAGNOSIS — Z79620 Long term (current) use of immunosuppressive biologic: Secondary | ICD-10-CM | POA: Diagnosis not present

## 2023-01-28 DIAGNOSIS — Z79890 Hormone replacement therapy (postmenopausal): Secondary | ICD-10-CM | POA: Diagnosis not present

## 2023-01-28 DIAGNOSIS — I2510 Atherosclerotic heart disease of native coronary artery without angina pectoris: Secondary | ICD-10-CM | POA: Diagnosis not present

## 2023-01-28 DIAGNOSIS — R5383 Other fatigue: Secondary | ICD-10-CM | POA: Insufficient documentation

## 2023-01-28 DIAGNOSIS — C3411 Malignant neoplasm of upper lobe, right bronchus or lung: Secondary | ICD-10-CM | POA: Insufficient documentation

## 2023-01-28 DIAGNOSIS — Z8249 Family history of ischemic heart disease and other diseases of the circulatory system: Secondary | ICD-10-CM | POA: Insufficient documentation

## 2023-01-28 DIAGNOSIS — Z923 Personal history of irradiation: Secondary | ICD-10-CM | POA: Insufficient documentation

## 2023-01-28 DIAGNOSIS — R7989 Other specified abnormal findings of blood chemistry: Secondary | ICD-10-CM

## 2023-01-28 DIAGNOSIS — K4090 Unilateral inguinal hernia, without obstruction or gangrene, not specified as recurrent: Secondary | ICD-10-CM | POA: Insufficient documentation

## 2023-01-28 DIAGNOSIS — R54 Age-related physical debility: Secondary | ICD-10-CM | POA: Diagnosis not present

## 2023-01-28 DIAGNOSIS — R0602 Shortness of breath: Secondary | ICD-10-CM | POA: Insufficient documentation

## 2023-01-28 DIAGNOSIS — N400 Enlarged prostate without lower urinary tract symptoms: Secondary | ICD-10-CM | POA: Insufficient documentation

## 2023-01-28 DIAGNOSIS — C787 Secondary malignant neoplasm of liver and intrahepatic bile duct: Secondary | ICD-10-CM | POA: Insufficient documentation

## 2023-01-28 DIAGNOSIS — C7951 Secondary malignant neoplasm of bone: Secondary | ICD-10-CM | POA: Insufficient documentation

## 2023-01-28 DIAGNOSIS — Z9981 Dependence on supplemental oxygen: Secondary | ICD-10-CM | POA: Diagnosis not present

## 2023-01-28 DIAGNOSIS — Z5112 Encounter for antineoplastic immunotherapy: Secondary | ICD-10-CM | POA: Insufficient documentation

## 2023-01-28 DIAGNOSIS — I700 Atherosclerosis of aorta: Secondary | ICD-10-CM | POA: Diagnosis not present

## 2023-01-28 DIAGNOSIS — Z801 Family history of malignant neoplasm of trachea, bronchus and lung: Secondary | ICD-10-CM | POA: Insufficient documentation

## 2023-01-28 DIAGNOSIS — Z87891 Personal history of nicotine dependence: Secondary | ICD-10-CM | POA: Diagnosis not present

## 2023-01-28 DIAGNOSIS — Z79899 Other long term (current) drug therapy: Secondary | ICD-10-CM | POA: Insufficient documentation

## 2023-01-28 DIAGNOSIS — M549 Dorsalgia, unspecified: Secondary | ICD-10-CM | POA: Insufficient documentation

## 2023-01-28 DIAGNOSIS — M2550 Pain in unspecified joint: Secondary | ICD-10-CM | POA: Diagnosis not present

## 2023-01-28 DIAGNOSIS — Z7189 Other specified counseling: Secondary | ICD-10-CM

## 2023-01-28 DIAGNOSIS — J439 Emphysema, unspecified: Secondary | ICD-10-CM | POA: Insufficient documentation

## 2023-01-28 DIAGNOSIS — D1771 Benign lipomatous neoplasm of kidney: Secondary | ICD-10-CM | POA: Insufficient documentation

## 2023-01-28 DIAGNOSIS — K5730 Diverticulosis of large intestine without perforation or abscess without bleeding: Secondary | ICD-10-CM | POA: Insufficient documentation

## 2023-01-28 DIAGNOSIS — Z823 Family history of stroke: Secondary | ICD-10-CM | POA: Insufficient documentation

## 2023-01-28 DIAGNOSIS — E039 Hypothyroidism, unspecified: Secondary | ICD-10-CM | POA: Diagnosis not present

## 2023-01-28 LAB — CBC WITH DIFFERENTIAL/PLATELET
Abs Immature Granulocytes: 0.01 10*3/uL (ref 0.00–0.07)
Basophils Absolute: 0 10*3/uL (ref 0.0–0.1)
Basophils Relative: 1 %
Eosinophils Absolute: 0.5 10*3/uL (ref 0.0–0.5)
Eosinophils Relative: 9 %
HCT: 39.4 % (ref 39.0–52.0)
Hemoglobin: 12.4 g/dL — ABNORMAL LOW (ref 13.0–17.0)
Immature Granulocytes: 0 %
Lymphocytes Relative: 25 %
Lymphs Abs: 1.4 10*3/uL (ref 0.7–4.0)
MCH: 30.8 pg (ref 26.0–34.0)
MCHC: 31.5 g/dL (ref 30.0–36.0)
MCV: 98 fL (ref 80.0–100.0)
Monocytes Absolute: 0.6 10*3/uL (ref 0.1–1.0)
Monocytes Relative: 11 %
Neutro Abs: 3 10*3/uL (ref 1.7–7.7)
Neutrophils Relative %: 54 %
Platelets: 151 10*3/uL (ref 150–400)
RBC: 4.02 MIL/uL — ABNORMAL LOW (ref 4.22–5.81)
RDW: 11.9 % (ref 11.5–15.5)
WBC: 5.4 10*3/uL (ref 4.0–10.5)
nRBC: 0 % (ref 0.0–0.2)

## 2023-01-28 LAB — COMPREHENSIVE METABOLIC PANEL
ALT: 10 U/L (ref 0–44)
AST: 16 U/L (ref 15–41)
Albumin: 4 g/dL (ref 3.5–5.0)
Alkaline Phosphatase: 46 U/L (ref 38–126)
Anion gap: 6 (ref 5–15)
BUN: 10 mg/dL (ref 8–23)
CO2: 43 mmol/L — ABNORMAL HIGH (ref 22–32)
Calcium: 8.6 mg/dL — ABNORMAL LOW (ref 8.9–10.3)
Chloride: 88 mmol/L — ABNORMAL LOW (ref 98–111)
Creatinine, Ser: 0.61 mg/dL (ref 0.61–1.24)
GFR, Estimated: >60 mL/min (ref >60)
Glucose, Bld: 89 mg/dL (ref 70–99)
Potassium: 3.9 mmol/L (ref 3.5–5.1)
Sodium: 137 mmol/L (ref 135–145)
Total Bilirubin: 0.2 mg/dL — ABNORMAL LOW (ref 0.3–1.2)
Total Protein: 7.2 g/dL (ref 6.5–8.1)

## 2023-01-28 MED ORDER — SODIUM CHLORIDE 0.9 % IV SOLN
Freq: Once | INTRAVENOUS | Status: AC
  Filled 2023-01-28: qty 250

## 2023-01-28 MED ORDER — HEPARIN SOD (PORK) LOCK FLUSH 100 UNIT/ML IV SOLN
500.0000 [IU] | Freq: Once | INTRAVENOUS | Status: AC | PRN
  Administered 2023-01-28: 500 [IU]
  Filled 2023-01-28: qty 5

## 2023-01-28 MED ORDER — SODIUM CHLORIDE 0.9 % IV SOLN
200.0000 mg | Freq: Once | INTRAVENOUS | Status: AC
  Administered 2023-01-28: 200 mg via INTRAVENOUS
  Filled 2023-01-28: qty 200

## 2023-01-28 NOTE — Progress Notes (Signed)
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   # 2021- MAY 26th-Keytrda x2 cycles; [borderline PS] July 7th-carbo Alimta Keytruda cycle #1  #November 2022-oligometastatic progression-right upper lobe subpleural involvement with; s/p radiation finished December 15.  Temporarily held Jaconita.  #Jan fourth 2023-restarted Keytruda.  # NGS/MOLECULAR TESTS:   # PALLIATIVE CARE EVALUATION:  # PAIN MANAGEMENT:    DIAGNOSIS:   STAGE:         ;  GOALS:  CURRENT/MOST RECENT THERAPY :     Cancer of upper lobe of right lung (HCC)  11/23/2019 Initial Diagnosis   Cancer of upper lobe of right lung (HCC)   12/16/2019 -  Chemotherapy   Patient is on Treatment Plan : LUNG Carboplatin (5) + Pemetrexed (500) + Pembrolizumab (200) D1 q21d Induction x 4 cycles / Maintenance Pemetrexed (500) + Pembrolizumab (200) D1 q21d     12/21/2019 - 02/26/2022 Chemotherapy   Patient is on Treatment Plan : LUNG CARBOplatin / Pemetrexed / Pembrolizumab q21d Induction x 4 cycles / Maintenance Pemetrexed + Pembrolizumab      HISTORY OF PRESENTING ILLNESS: Thin built frail appearing male patient.  No acute distress. With his wife.  Nasal cannula oxygen.  Ambulating independently.    Cody [name] 67 y.o.  male  patient with metastatic non-small cell lung cancer; cord compression status post decompressive surgery; currently on Keytruda maintenance-is here for follow-up.  Patient is here with his wife. He reports that he has been doing well. He recently had a updated CT scan on 01/21/2023 which showed stable disease state.  Continues to have chronic SOB and is on 4L of oxygen. Some fatigue. Pt is able to due all his activities of daily living.   Appetite is almost normal. Denies pain. Denies any dizziness.   Wt Readings from Last 3 Encounters:  01/28/23 124 lb 14.4 oz (56.7 kg)  01/07/23 125 lb 9.6 oz (57 kg)  12/17/22 126 lb 3.2 oz (57.2 kg)      Review of Systems  Constitutional:  Positive for malaise/fatigue. Negative for chills, diaphoresis and fever.  HENT:  Negative for nosebleeds and sore throat.   Eyes:  Negative for double vision.  Respiratory:  Positive for shortness of breath. Negative for cough, hemoptysis, sputum production and wheezing.   Cardiovascular:  Negative for chest pain, palpitations, orthopnea and leg swelling.  Gastrointestinal:  Negative for abdominal pain, blood in stool, constipation, heartburn, melena, nausea and vomiting.  Genitourinary:  Negative for dysuria, frequency and urgency.  Musculoskeletal:  Positive for back pain and joint pain.  Skin: Negative.  Negative for itching and rash.  Neurological:  Negative for dizziness, tingling, focal weakness, weakness and headaches.  Endo/Heme/Allergies:  Does not bruise/bleed easily.  Psychiatric/Behavioral:  Negative for depression. The patient is not nervous/anxious and does not have insomnia.      MEDICAL HISTORY:  Past Medical History:  Diagnosis  Date   Allergy    Cancer of upper lobe of right lung (HCC) 10/2019   COPD (chronic obstructive pulmonary disease) (HCC)    Prostate enlargement     SURGICAL HISTORY: Past Surgical History:  Procedure Laterality Date   HERNIA REPAIR Bilateral 1982   HERNIA REPAIR  Right 1994   IR IMAGING GUIDED PORT INSERTION  02/24/2020   LAMINECTOMY FOR EXCISION / EVACUATION INTRASPINAL LESION  11/07/2019   T1-T 6 at [facility]     SOCIAL HISTORY: Social History   Socioeconomic History   Marital status: Married    Spouse name: [name]    Number of children: 4   Years of education: Not on file   Highest education level: Some college, no degree  Occupational History   Occupation: dispatch and delivery   Tobacco Use   Smoking status: Former    Packs/day: 2.00    Years: 30.00    Additional pack years: 0.00    Total pack years: 60.00    Types: Cigarettes    Quit date: 07/29/2011    Years since quitting: 11.5   Smokeless tobacco: Never   Tobacco comments:    pt vapes  Vaping Use   Vaping Use: Not on file  Substance and Sexual Activity   Alcohol use: Yes    Alcohol/week: 5.0 standard drinks of alcohol    Types: 5 Standard drinks or equivalent per week    Comment: 1-2/day   Drug use: No   Sexual activity: Not Currently    Partners: Female  Other Topics Concern   Not on file  Social History Narrative   Worked in warehouse/ quit smoking in 2015; beer 1/day; in [place]; with wife.    Social Determinants of Health   Financial Resource Strain: Low Risk  (11/15/2022)   Overall Financial Resource Strain (CARDIA)    Difficulty of Paying Living Expenses: Not very hard  Food Insecurity: No Food Insecurity (11/15/2022)   Hunger Vital Sign    Worried About Running Out of Food in the Last Year: Never true    Ran Out of Food in the Last Year: Never true  Transportation Needs: No Transportation Needs (11/15/2022)   PRAPARE - Administrator, Civil Service (Medical): No    Lack of Transportation (Non-Medical): No  Physical Activity: Unknown (11/15/2022)   Exercise Vital Sign    Days of Exercise per Week: Patient declined    Minutes of Exercise per Session: Not on file  Stress: No Stress Concern Present (11/15/2022)   [employer] of Occupational  Health - Occupational Stress Questionnaire    Feeling of Stress : Only a little  Social Connections: Unknown (11/15/2022)   Social Connection and Isolation Panel [NHANES]    Frequency of Communication with Friends and Family: Three times a week    Frequency of Social Gatherings with Friends and Family: Patient declined    Attends Religious Services: More than 4 times per year    Active Member of [employer] or Organizations: Not on file    Attends Banker Meetings: Not on file    Marital Status: Married  Intimate Partner Violence: Not At Risk (10/21/2019)   Humiliation, Afraid, Rape, and Kick questionnaire    Fear of Current or Ex-Partner: No    Emotionally Abused: No    Physically Abused: No    Sexually Abused: No    FAMILY HISTORY: Family History  Problem Relation Age of Onset   Heart disease Father    CVA Mother  Lung cancer Sister    Lung cancer Brother     ALLERGIES:  has No Known Allergies.  MEDICATIONS:  Current Outpatient Medications  Medication Sig Dispense Refill   acetaminophen (TYLENOL) 500 MG tablet Take 500-1,000 mg by mouth every 6 (six) hours as needed for mild pain or fever.      albuterol (VENTOLIN HFA) 108 (90 Base) MCG/ACT inhaler Inhale 2 puffs into the lungs every 4 (four) hours as needed for wheezing or shortness of breath. 18 g 0   Calcium Carb-Cholecalciferol (OYSTER SHELL CALCIUM) 500-400 MG-UNIT TABS Take 500 tablets by mouth.     escitalopram (LEXAPRO) 5 MG tablet TAKE ONE TABLET BY MOUTH EVERY MORNING 90 tablet 0   levothyroxine (SYNTHROID) 50 MCG tablet TAKE 1 TABLET BY MOUTH DAILY BEFORE BREAKFAST 90 tablet 2   lidocaine-prilocaine (EMLA) cream Apply 1 application topically as needed. Apply small amount to port site at least 1 hour prior to it being accessed, cover with plastic wrap 30 g 1   Loratadine 10 MG CAPS      Multiple Vitamin (MULTIVITAMIN) tablet Take 1 tablet by mouth daily.     pravastatin (PRAVACHOL) 20 MG tablet Take 1 tablet  (20 mg total) by mouth daily. 90 tablet 3   QUEtiapine (SEROQUEL) 25 MG tablet TAKE 1 TABLET BY MOUTH AT BEDTIME 90 tablet 1   No current facility-administered medications for this visit.   Facility-Administered Medications Ordered in Other Visits  Medication Dose Route Frequency Provider Last Rate Last Admin   heparin lock flush 100 UNIT/ML injection            PHYSICAL EXAMINATION: ECOG PERFORMANCE STATUS: 3 - Symptomatic, >50% confined to bed  Vitals:   01/28/23 1319  BP: 137/87  Pulse: 77  Temp: (!) 97.1 F (36.2 C)  SpO2: 98%   Filed Weights   01/28/23 1319  Weight: 124 lb 14.4 oz (56.7 kg)     Physical Exam HENT:     Head: Normocephalic and atraumatic.     Mouth/Throat:     Pharynx: No oropharyngeal exudate.  Eyes:     Pupils: Pupils are equal, round, and reactive to light.  Cardiovascular:     Rate and Rhythm: Normal rate and regular rhythm.  Pulmonary:     Effort: No respiratory distress.     Breath sounds: No wheezing.     Comments: Decreased air entry bilaterally. Abdominal:     General: Bowel sounds are normal. There is no distension.     Palpations: Abdomen is soft. There is no mass.     Tenderness: There is no abdominal tenderness. There is no guarding or rebound.  Musculoskeletal:        General: No tenderness. Normal range of motion.     Cervical back: Normal range of motion and neck supple.  Skin:    General: Skin is warm.  Neurological:     Mental Status: He is alert and oriented to person, place, and time.  Psychiatric:        Mood and Affect: Affect normal.      LABORATORY DATA:  I have reviewed the data as listed Lab Results  Component Value Date   WBC 5.4 01/28/2023   HGB 12.4 (L) 01/28/2023   HCT 39.4 01/28/2023   MCV 98.0 01/28/2023   PLT 151 01/28/2023   Recent Labs    12/17/22 0842 01/07/23 0848 01/28/23 1303  NA 138 141 137  K 4.1 4.2 3.9  CL 88* 89* 88*  CO2 38* 43* 43*  GLUCOSE 91 96 89  BUN 9 12 10   CREATININE  0.59* 0.56* 0.61  CALCIUM 9.0 8.2* 8.6*  GFRNONAA >60 >60 >60  PROT 7.3 7.3 7.2  ALBUMIN 3.9 4.0 4.0  AST 21 22 16   ALT 13 12 10   ALKPHOS 52 50 46  BILITOT 0.5 0.6 0.2*    ASSESSMENT & PLAN:  Cancer of upper lobe of right lung (HCC) # [april2021] Stage IV metastatic non-small cell lung cancer favor adeno; mets to bone; liver.   Oligo-metastatic progression-s/p RT right upper lobe mass [s/p dec 15th, 2022].  Currently on Keytruda maintenance.    June 26th, 2024- CT CAP-scarring noted in the right upper thorax;  no evidence of local tumor recurrence;  Stable sclerotic osseous metastases; No new or progressive metastatic disease in the chest, abdomen or pelvis.  Stable. Likely will need repeat scan in scan in Sept, 2024.    Today his labs are acceptable for treatment today with Keytruda   Bone metastases- [facility], Dr.Parks]. Continue multivitamin with calcium plus vitamin D 3 times a day.  S/p extractions. jan 19th, 2024; s/p dental clearance [April 2024]. vit D 25 OH- march 2024- 70. Currently on Zometa 3 mg q 3 M. Last dose was on 11/26/2022. Next due on 02/2023. Stable    # Hypothyroidism- [ Mid sep 2022]  Normal T3-T4.  Currently on 50 mcg a day; June 2024. TSH 3.3. Stable.   # COPD-. On 4 lit O2; Trelegy.Clinically-stable      DISPOSITION:  # Keytruda today # follow up in 3 weeks-Dr.Rao-  port/labs- cbc/cmp/TSH/T4;  Keytruda, zometa (cycle 52 signed)   All questions were answered. The patient knows to call the clinic with any problems, questions or concerns.    Rushie Chestnut, PA-C 01/28/2023 3:47 PM

## 2023-01-30 DIAGNOSIS — J441 Chronic obstructive pulmonary disease with (acute) exacerbation: Secondary | ICD-10-CM | POA: Diagnosis not present

## 2023-01-30 DIAGNOSIS — J9601 Acute respiratory failure with hypoxia: Secondary | ICD-10-CM | POA: Diagnosis not present

## 2023-02-04 ENCOUNTER — Ambulatory Visit: Payer: Self-pay | Admitting: *Deleted

## 2023-02-04 NOTE — Patient Outreach (Signed)
  Care Coordination   Follow Up Visit Note   02/05/2023 Name: Cody [name] MRN: [id number] DOB: Feb 05, 1956  Cody [name] is a 67 y.o. year old male who sees Cody Cory, MD for primary care. I spoke with  Cody [name] by phone today.  What matters to the patients health and wellness today?  Remain stable and independent.  Denies need for follow up call, but provided contact information for future purposes.    Goals Addressed             This Visit's Progress    COMPLETED: Care Coordination Activities - No follow up needed       Interventions Today    Flowsheet Row Most Recent Value  Chronic Disease   Chronic disease during today's visit Other  [lung cancer]  General Interventions   General Interventions Discussed/Reviewed General Interventions Reviewed, Health Screening, Doctor Visits  Doctor Visits Discussed/Reviewed Doctor Visits Reviewed, PCP, Specialist  [Cancer center 7/24, PCP 10/24.  Will call for AWV when it is time]  Health Screening Prostate, Colonoscopy, Bone Density  [CT every 3 months, last was 6/26, state it was good]  PCP/Specialist Visits Compliance with follow-up visit  Education Interventions   Education Provided Provided Education  Provided Verbal Education On Nutrition, Medication, When to see the doctor              SDOH assessments and interventions completed:  Yes  SDOH Interventions Today    Flowsheet Row Most Recent Value  SDOH Interventions   Food Insecurity Interventions Intervention Not Indicated  Housing Interventions Intervention Not Indicated  Transportation Interventions Intervention Not Indicated  Utilities Interventions Intervention Not Indicated        Care Coordination Interventions:  Yes, provided   Follow up plan: No further intervention required.   Encounter Outcome:  Pt. Visit Completed   Kemper Durie, RN, MSN, [facility] Care Management Care Management Coordinator [phone]

## 2023-02-05 ENCOUNTER — Encounter: Payer: Self-pay | Admitting: *Deleted

## 2023-02-17 ENCOUNTER — Encounter: Payer: Self-pay | Admitting: Internal Medicine

## 2023-02-17 ENCOUNTER — Other Ambulatory Visit: Payer: Self-pay | Admitting: Internal Medicine

## 2023-02-17 DIAGNOSIS — F341 Dysthymic disorder: Secondary | ICD-10-CM

## 2023-02-17 DIAGNOSIS — F419 Anxiety disorder, unspecified: Secondary | ICD-10-CM

## 2023-02-18 ENCOUNTER — Inpatient Hospital Stay: Payer: Medicare HMO

## 2023-02-18 ENCOUNTER — Inpatient Hospital Stay: Payer: Medicare HMO | Admitting: Internal Medicine

## 2023-02-18 VITALS — BP 140/78 | HR 80 | Temp 97.6°F | Wt 124.2 lb

## 2023-02-18 DIAGNOSIS — R54 Age-related physical debility: Secondary | ICD-10-CM | POA: Diagnosis not present

## 2023-02-18 DIAGNOSIS — E039 Hypothyroidism, unspecified: Secondary | ICD-10-CM | POA: Diagnosis not present

## 2023-02-18 DIAGNOSIS — R5383 Other fatigue: Secondary | ICD-10-CM | POA: Diagnosis not present

## 2023-02-18 DIAGNOSIS — Z79620 Long term (current) use of immunosuppressive biologic: Secondary | ICD-10-CM | POA: Diagnosis not present

## 2023-02-18 DIAGNOSIS — Z7189 Other specified counseling: Secondary | ICD-10-CM

## 2023-02-18 DIAGNOSIS — Z79899 Other long term (current) drug therapy: Secondary | ICD-10-CM | POA: Diagnosis not present

## 2023-02-18 DIAGNOSIS — M549 Dorsalgia, unspecified: Secondary | ICD-10-CM | POA: Diagnosis not present

## 2023-02-18 DIAGNOSIS — N400 Enlarged prostate without lower urinary tract symptoms: Secondary | ICD-10-CM | POA: Diagnosis not present

## 2023-02-18 DIAGNOSIS — I2510 Atherosclerotic heart disease of native coronary artery without angina pectoris: Secondary | ICD-10-CM | POA: Diagnosis not present

## 2023-02-18 DIAGNOSIS — C3411 Malignant neoplasm of upper lobe, right bronchus or lung: Secondary | ICD-10-CM

## 2023-02-18 DIAGNOSIS — I700 Atherosclerosis of aorta: Secondary | ICD-10-CM | POA: Diagnosis not present

## 2023-02-18 DIAGNOSIS — Z79890 Hormone replacement therapy (postmenopausal): Secondary | ICD-10-CM | POA: Diagnosis not present

## 2023-02-18 DIAGNOSIS — Z87891 Personal history of nicotine dependence: Secondary | ICD-10-CM | POA: Diagnosis not present

## 2023-02-18 DIAGNOSIS — Z923 Personal history of irradiation: Secondary | ICD-10-CM | POA: Diagnosis not present

## 2023-02-18 DIAGNOSIS — J439 Emphysema, unspecified: Secondary | ICD-10-CM | POA: Diagnosis not present

## 2023-02-18 DIAGNOSIS — Z8249 Family history of ischemic heart disease and other diseases of the circulatory system: Secondary | ICD-10-CM | POA: Diagnosis not present

## 2023-02-18 DIAGNOSIS — C787 Secondary malignant neoplasm of liver and intrahepatic bile duct: Secondary | ICD-10-CM | POA: Diagnosis not present

## 2023-02-18 DIAGNOSIS — D1771 Benign lipomatous neoplasm of kidney: Secondary | ICD-10-CM | POA: Diagnosis not present

## 2023-02-18 DIAGNOSIS — K4090 Unilateral inguinal hernia, without obstruction or gangrene, not specified as recurrent: Secondary | ICD-10-CM | POA: Diagnosis not present

## 2023-02-18 DIAGNOSIS — C7951 Secondary malignant neoplasm of bone: Secondary | ICD-10-CM | POA: Diagnosis not present

## 2023-02-18 DIAGNOSIS — K5730 Diverticulosis of large intestine without perforation or abscess without bleeding: Secondary | ICD-10-CM | POA: Diagnosis not present

## 2023-02-18 DIAGNOSIS — R0602 Shortness of breath: Secondary | ICD-10-CM | POA: Diagnosis not present

## 2023-02-18 DIAGNOSIS — M2550 Pain in unspecified joint: Secondary | ICD-10-CM | POA: Diagnosis not present

## 2023-02-18 DIAGNOSIS — Z5112 Encounter for antineoplastic immunotherapy: Secondary | ICD-10-CM | POA: Diagnosis not present

## 2023-02-18 DIAGNOSIS — Z9981 Dependence on supplemental oxygen: Secondary | ICD-10-CM | POA: Diagnosis not present

## 2023-02-18 LAB — COMPREHENSIVE METABOLIC PANEL
ALT: 15 U/L (ref 0–44)
AST: 22 U/L (ref 15–41)
Albumin: 4.1 g/dL (ref 3.5–5.0)
Alkaline Phosphatase: 51 U/L (ref 38–126)
Anion gap: 6 (ref 5–15)
BUN: 12 mg/dL (ref 8–23)
CO2: 42 mmol/L — ABNORMAL HIGH (ref 22–32)
Calcium: 8.7 mg/dL — ABNORMAL LOW (ref 8.9–10.3)
Chloride: 88 mmol/L — ABNORMAL LOW (ref 98–111)
Creatinine, Ser: 0.59 mg/dL — ABNORMAL LOW (ref 0.61–1.24)
GFR, Estimated: >60 mL/min (ref >60)
Glucose, Bld: 101 mg/dL — ABNORMAL HIGH (ref 70–99)
Potassium: 3.8 mmol/L (ref 3.5–5.1)
Sodium: 136 mmol/L (ref 135–145)
Total Bilirubin: 0.3 mg/dL (ref 0.3–1.2)
Total Protein: 7.4 g/dL (ref 6.5–8.1)

## 2023-02-18 LAB — CBC WITH DIFFERENTIAL/PLATELET
Abs Immature Granulocytes: 0.01 10*3/uL (ref 0.00–0.07)
Basophils Absolute: 0 10*3/uL (ref 0.0–0.1)
Basophils Relative: 1 %
Eosinophils Absolute: 0.5 10*3/uL (ref 0.0–0.5)
Eosinophils Relative: 9 %
HCT: 40.9 % (ref 39.0–52.0)
Hemoglobin: 12.6 g/dL — ABNORMAL LOW (ref 13.0–17.0)
Immature Granulocytes: 0 %
Lymphocytes Relative: 23 %
Lymphs Abs: 1.2 10*3/uL (ref 0.7–4.0)
MCH: 30.1 pg (ref 26.0–34.0)
MCHC: 30.8 g/dL (ref 30.0–36.0)
MCV: 97.6 fL (ref 80.0–100.0)
Monocytes Absolute: 0.6 10*3/uL (ref 0.1–1.0)
Monocytes Relative: 11 %
Neutro Abs: 3 10*3/uL (ref 1.7–7.7)
Neutrophils Relative %: 56 %
Platelets: 157 10*3/uL (ref 150–400)
RBC: 4.19 MIL/uL — ABNORMAL LOW (ref 4.22–5.81)
RDW: 12 % (ref 11.5–15.5)
WBC: 5.3 10*3/uL (ref 4.0–10.5)
nRBC: 0 % (ref 0.0–0.2)

## 2023-02-18 MED ORDER — SODIUM CHLORIDE 0.9 % IV SOLN
200.0000 mg | Freq: Once | INTRAVENOUS | Status: AC
  Administered 2023-02-18: 200 mg via INTRAVENOUS
  Filled 2023-02-18: qty 8

## 2023-02-18 MED ORDER — HEPARIN SOD (PORK) LOCK FLUSH 100 UNIT/ML IV SOLN
500.0000 [IU] | Freq: Once | INTRAVENOUS | Status: AC | PRN
  Administered 2023-02-18: 500 [IU]
  Filled 2023-02-18: qty 5

## 2023-02-18 MED ORDER — SODIUM CHLORIDE 0.9 % IV SOLN
Freq: Once | INTRAVENOUS | Status: AC
  Filled 2023-02-18: qty 250

## 2023-02-18 NOTE — Progress Notes (Signed)
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   # 2021- MAY 26th-Keytrda x2 cycles; [borderline PS] July 7th-carbo Alimta Keytruda cycle #1  #November 2022-oligometastatic progression-right upper lobe subpleural involvement with; s/p radiation finished December 15.  Temporarily held [place].  #Jan fourth 2023-restarted Keytruda.  # NGS/MOLECULAR TESTS:   # PALLIATIVE CARE EVALUATION:  # PAIN MANAGEMENT:    DIAGNOSIS:   STAGE:         ;  GOALS:  CURRENT/MOST RECENT THERAPY :     Cancer of upper lobe of right lung (HCC)  11/23/2019 Initial Diagnosis   Cancer of upper lobe of right lung (HCC)   12/16/2019 -  Chemotherapy   Patient is on Treatment Plan : LUNG Carboplatin (5) + Pemetrexed (500) + Pembrolizumab (200) D1 q21d Induction x 4 cycles / Maintenance Pemetrexed (500) + Pembrolizumab (200) D1 q21d     12/21/2019 - 02/26/2022 Chemotherapy   Patient is on Treatment Plan : LUNG CARBOplatin / Pemetrexed / Pembrolizumab q21d Induction x 4 cycles / Maintenance Pemetrexed + Pembrolizumab      HISTORY OF PRESENTING ILLNESS: Thin built frail appearing male patient.  No acute distress. Alone.  Nasal cannula oxygen.  Ambulating independently.    Cody [name] 67 y.o.  male patient  with metastatic non-small cell lung cancer; cord compression status post decompressive surgery; currently on Keytruda maintenance-is here for follow-up/ CT CAP. Marland Kitchen  Patient  continues to have chronic SOB.  Not any worse.Oxygen dependent. Some fatigue. Pt is able to due all his activities of daily living.   Appetite is almost normal. Denies pain. Denies any dizziness.    Review of Systems  Constitutional:  Positive for malaise/fatigue. Negative for chills, diaphoresis and fever.  HENT:  Negative for nosebleeds and sore throat.   Eyes:  Negative for double vision.  Respiratory:  Positive for shortness of breath. Negative for cough, hemoptysis, sputum production and wheezing.   Cardiovascular:  Negative for chest pain, palpitations, orthopnea and leg swelling.  Gastrointestinal:  Negative for abdominal pain, blood in stool, constipation, heartburn, melena, nausea and vomiting.  Genitourinary:  Negative for dysuria, frequency and urgency.  Musculoskeletal:  Positive for back pain and joint pain.  Skin: Negative.  Negative for itching and rash.  Neurological:  Negative for dizziness, tingling, focal weakness, weakness and headaches.  Endo/Heme/Allergies:  Does not bruise/bleed easily.  Psychiatric/Behavioral:  Negative for depression. The patient is not nervous/anxious and does not have insomnia.      MEDICAL HISTORY:  Past Medical History:  Diagnosis Date   Allergy    Cancer of upper lobe of right lung (HCC) 10/2019   COPD (chronic obstructive pulmonary disease) (HCC)    Prostate enlargement     SURGICAL HISTORY: Past Surgical History:  Procedure Laterality Date   HERNIA REPAIR Bilateral 1982   HERNIA REPAIR Right 1994   IR IMAGING GUIDED PORT INSERTION  02/24/2020   LAMINECTOMY FOR EXCISION / EVACUATION INTRASPINAL LESION  11/07/2019   T1-T 6 at [facility]     SOCIAL HISTORY: Social History   Socioeconomic History   Marital status: Married    Spouse name: [name]    Number of  children: 4   Years of education: Not on file   Highest education level: Some college, no degree  Occupational History   Occupation: dispatch and delivery   Tobacco Use   Smoking status: Former    Current packs/day: 0.00    Average packs/day: 2.0 packs/day for 30.0 years (60.0 ttl pk-yrs)    Types: Cigarettes    Start date: 07/28/1981    Quit date: 07/29/2011    Years since quitting: 11.5   Smokeless tobacco: Never   Tobacco comments:    pt vapes  Vaping Use   Vaping status: Not on file  Substance and Sexual Activity   Alcohol use: Yes    Alcohol/week: 5.0 standard drinks of alcohol    Types: 5 Standard drinks or equivalent per week    Comment: 1-2/day   Drug use: No   Sexual activity: Not Currently    Partners: Female  Other Topics Concern   Not on file  Social History Narrative   Worked in warehouse/ quit smoking in 2015; beer 1/day; in [place]; with wife.    Social Determinants of Health   Financial Resource Strain: Low Risk  (11/15/2022)   Overall Financial Resource Strain (CARDIA)    Difficulty of Paying Living Expenses: Not very hard  Food Insecurity: No Food Insecurity (02/04/2023)   Hunger Vital Sign    Worried About Running Out of Food in the Last Year: Never true    Ran Out of Food in the Last Year: Never true  Transportation Needs: No Transportation Needs (02/04/2023)   PRAPARE - Administrator, Civil Service (Medical): No    Lack of Transportation (Non-Medical): No  Physical Activity: Unknown (11/15/2022)   Exercise Vital Sign    Days of Exercise per Week: Patient declined    Minutes of Exercise per Session: Not on file  Stress: No Stress Concern Present (11/15/2022)   [employer] of Occupational Health - Occupational Stress Questionnaire    Feeling of Stress : Only a little  Social Connections: Unknown (11/15/2022)   Social Connection and Isolation Panel [NHANES]    Frequency of Communication with Friends and Family: Three times a week     Frequency of Social Gatherings with Friends and Family: Patient declined    Attends Religious Services: More than 4 times per year    Active Member of [employer] or Organizations: Not on file    Attends Banker Meetings: Not on file    Marital Status: Married  Intimate Partner Violence: Not At Risk (10/21/2019)   Humiliation, Afraid, Rape, and Kick questionnaire    Fear of Current or Ex-Partner: No    Emotionally Abused: No    Physically Abused: No    Sexually Abused: No    FAMILY HISTORY: Family History  Problem Relation Age of Onset   Heart disease Father    CVA Mother    Lung cancer Sister    Lung cancer Brother     ALLERGIES:  has No Known Allergies.  MEDICATIONS:  Current Outpatient Medications  Medication Sig Dispense Refill   acetaminophen (TYLENOL) 500 MG tablet Take 500-1,000 mg by mouth every 6 (six) hours as needed for mild pain or fever.  albuterol (VENTOLIN HFA) 108 (90 Base) MCG/ACT inhaler Inhale 2 puffs into the lungs every 4 (four) hours as needed for wheezing or shortness of breath. 18 g 0   Calcium Carb-Cholecalciferol (OYSTER SHELL CALCIUM) 500-400 MG-UNIT TABS Take 500 tablets by mouth.     escitalopram (LEXAPRO) 5 MG tablet TAKE 1 TABLET BY MOUTH EVERY MORNING 90 tablet 0   levothyroxine (SYNTHROID) 50 MCG tablet TAKE 1 TABLET BY MOUTH DAILY BEFORE BREAKFAST 90 tablet 2   lidocaine-prilocaine (EMLA) cream Apply 1 application topically as needed. Apply small amount to port site at least 1 hour prior to it being accessed, cover with plastic wrap 30 g 1   Loratadine 10 MG CAPS      Multiple Vitamin (MULTIVITAMIN) tablet Take 1 tablet by mouth daily.     pravastatin (PRAVACHOL) 20 MG tablet Take 1 tablet (20 mg total) by mouth daily. 90 tablet 3   QUEtiapine (SEROQUEL) 25 MG tablet TAKE 1 TABLET BY MOUTH AT BEDTIME 90 tablet 1   No current facility-administered medications for this visit.   Facility-Administered Medications Ordered in Other Visits   Medication Dose Route Frequency Provider Last Rate Last Admin   heparin lock flush 100 UNIT/ML injection            heparin lock flush 100 unit/mL  500 Units Intracatheter Once PRN Covington, Sarah M, PA-C          .  PHYSICAL EXAMINATION: ECOG PERFORMANCE STATUS: 3 - Symptomatic, >50% confined to bed  Vitals:   02/18/23 0855  BP: (!) 140/78  Pulse: 80  Temp: 97.6 F (36.4 C)  SpO2: 100%    Filed Weights   02/18/23 0855  Weight: 124 lb 3.2 oz (56.3 kg)      Physical Exam HENT:     Head: Normocephalic and atraumatic.     Mouth/Throat:     Pharynx: No oropharyngeal exudate.  Eyes:     Pupils: Pupils are equal, round, and reactive to light.  Cardiovascular:     Rate and Rhythm: Normal rate and regular rhythm.  Pulmonary:     Effort: No respiratory distress.     Breath sounds: No wheezing.     Comments: Decreased air entry bilaterally. Abdominal:     General: Bowel sounds are normal. There is no distension.     Palpations: Abdomen is soft. There is no mass.     Tenderness: There is no abdominal tenderness. There is no guarding or rebound.  Musculoskeletal:        General: No tenderness. Normal range of motion.     Cervical back: Normal range of motion and neck supple.  Skin:    General: Skin is warm.  Neurological:     Mental Status: He is alert and oriented to person, place, and time.  Psychiatric:        Mood and Affect: Affect normal.      LABORATORY DATA:  I have reviewed the data as listed Lab Results  Component Value Date   WBC 5.3 02/18/2023   HGB 12.6 (L) 02/18/2023   HCT 40.9 02/18/2023   MCV 97.6 02/18/2023   PLT 157 02/18/2023   Recent Labs    01/07/23 0848 01/28/23 1303 02/18/23 0843  NA 141 137 136  K 4.2 3.9 3.8  CL 89* 88* 88*  CO2 43* 43* 42*  GLUCOSE 96 89 101*  BUN 12 10 12   CREATININE 0.56* 0.61 0.59*  CALCIUM 8.2* 8.6* 8.7*  GFRNONAA >60 >60 >60  PROT 7.3 7.2 7.4  ALBUMIN 4.0 4.0 4.1  AST 22 16 22   ALT 12 10 15    ALKPHOS 50 46 51  BILITOT 0.6 0.2* 0.3    RADIOGRAPHIC STUDIES: I have personally reviewed the radiological images as listed and agreed with the findings in the report. CT CHEST ABDOMEN PELVIS W CONTRAST  Result Date: 01/21/2023 CLINICAL DATA:  Metastatic lung cancer restaging * Tracking Code: BO * EXAM: CT CHEST, ABDOMEN, AND PELVIS WITH CONTRAST TECHNIQUE: Multidetector CT imaging of the chest, abdomen and pelvis was performed following the standard protocol during bolus administration of intravenous contrast. RADIATION DOSE REDUCTION: This exam was performed according to the departmental dose-optimization program which includes automated exposure control, adjustment of the mA and/or kV according to patient size and/or use of iterative reconstruction technique. CONTRAST:  80mL OMNIPAQUE IOHEXOL 300 MG/ML  SOLN COMPARISON:  10/10/2022 FINDINGS: CT CHEST FINDINGS Cardiovascular: Right chest port catheter. Aortic atherosclerosis. Normal heart size. Left coronary artery calcifications. No pericardial effusion. Mediastinum/Nodes: No enlarged mediastinal, hilar, or axillary lymph nodes. Thyroid gland, trachea, and esophagus demonstrate no significant findings. Lungs/Pleura: Severe emphysema. Diffuse bilateral bronchial wall thickening. Unchanged post treatment appearance of the right pulmonary apex with fibrotic scarring and volume loss. No pleural effusion or pneumothorax. Musculoskeletal: Unchanged sclerotic, expansile appearance of the posterior right third through fifth ribs (series 3, image 14). Status post laminectomy and posterior fusion of T1 through T6, bridging sclerotic pathologic fractures of T3 and T4 (series 6, image 78). No acute osseous findings. CT ABDOMEN PELVIS FINDINGS Hepatobiliary: No solid liver abnormality is seen. No gallstones, gallbladder wall thickening, or biliary dilatation. Pancreas: Unremarkable. No pancreatic ductal dilatation or surrounding inflammatory changes. Spleen: Normal  in size without significant abnormality. Adrenals/Urinary Tract: Adrenal glands are unremarkable. 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 Kidneys are otherwise normal, without renal calculi, solid lesion, or hydronephrosis. Diffuse urinary bladder wall thickening. Stomach/Bowel: Stomach is within normal limits. Appendix appears normal. No evidence of bowel wall thickening, distention, or inflammatory changes. Sigmoid diverticula. Vascular/Lymphatic: Aortic atherosclerosis. No enlarged abdominal or pelvic lymph nodes. Reproductive: Severe prostatomegaly. Other: Evidence of right inguinal hernia repair. Unchanged superficial subcutaneous inclusion cyst of the right groin (series 2, image 109). No ascites. Musculoskeletal: No acute osseous findings. IMPRESSION: 1. Unchanged post treatment appearance of the right pulmonary apex with fibrotic scarring and volume loss. 2. Unchanged sclerotic, expansile appearance of the posterior right third through fifth ribs consistent with treated metastases. Status post laminectomy and posterior fusion of T1 through T6, bridging sclerotic pathologic metastatic fractures of T3 and T4. 3. No evidence of lymphadenopathy or soft tissue metastatic disease in the chest, abdomen, or pelvis. 4. Severe emphysema and diffuse bilateral bronchial wall thickening. 5. Coronary artery disease. 6. Severe prostatomegaly with diffuse urinary bladder wall thickening, consistent with chronic outlet obstruction. Aortic Atherosclerosis (ICD10-I70.0) and Emphysema (ICD10-J43.9). Electronically Signed   By: Jearld Lesch M.D.   On: 01/21/2023 12:48     ASSESSMENT & PLAN:   Cancer of upper lobe of right lung (HCC) # [april2021] Stage IV metastatic non-small cell lung cancer favor adeno; mets to bone; liver.   Oligo-metastatic  progression-s/p RT right upper lobe mass [s/p dec 15th, 2022].  Currently on Keytruda maintenance.   # June 26th, 2024- CT CAP-  Unchanged post treatment appearance of the right pulmonary apex with fibrotic scarring and volume  loss; Unchanged sclerotic, expansile appearance of the posterior right third through fifth ribs consistent with treated metastases; No evidence of lymphadenopathy or soft tissue metastatic disease in the chest, abdomen, or pelvis.  stable   # proceed with [place] maintainance today. Labs today reviewed;  acceptable for treatment today.     # Bone metastases- [[facility], Dr.Parks]. Continue multivitamin with calcium plus vitamin D 3 times a day.  S/p extractions. jan 19th, 2024; s/p dental clearance [April 2024]. vit D 25 [place]- march 2024- 70. Currently on Zometa 3 mg q 3 M. stable   # Hypothyroidism- [ Mid sep 2022]  Normal T3-T4.  Currently on 50 mcg a day; JUNE 2024- WNL- stable    # COPD-. On 2-3 lit O2; Trelegy.Clinically- stable   #Incidental findings on Imaging  CT , 2024: Emphysema prostatomegaly atherosclerosis/arteriosclerosisI reviewed/discussed/counseled the patient.   Referral to Dr.Brandon re: prostate enlargement. Check PSA.   HOLD ZOMETA- last 11/26/2022 q 76M;    # DISPOSITION:  # referral to Dr.Brandon re: prostate enlargement # Keytruda today;  # follow up in 3 weeks-MD..  port/labs- cbc/cmp;CEA; PSA-  Keytruda; Zometa ---Dr.B    All questions were answered. The patient knows to call the clinic with any problems, questions or concerns.    Earna Coder, MD 02/18/2023 10:49 AM

## 2023-02-18 NOTE — Patient Instructions (Signed)
[facility]  Discharge Instructions: Thank you for choosing [facility] to provide your oncology and hematology care.  If you have a lab appointment with the Cancer Center, please go directly to the Cancer Center and check in at the registration area.  Wear comfortable clothing and clothing appropriate for easy access to any Portacath or PICC line.   We strive to give you quality time with your provider. You may need to reschedule your appointment if you arrive late (15 or more minutes).  Arriving late affects you and other patients whose appointments are after yours.  Also, if you miss three or more appointments without notifying the office, you may be dismissed from the clinic at the provider's discretion.      For prescription refill requests, have your pharmacy contact our office and allow 72 hours for refills to be completed.    Today you received the following chemotherapy and/or immunotherapy agents KEYTRUDA      To help prevent nausea and vomiting after your treatment, we encourage you to take your nausea medication as directed.  BELOW ARE SYMPTOMS THAT SHOULD BE REPORTED IMMEDIATELY: *FEVER GREATER THAN 100.4 F (38 C) OR HIGHER *CHILLS OR SWEATING *NAUSEA AND VOMITING THAT IS NOT CONTROLLED WITH YOUR NAUSEA MEDICATION *UNUSUAL SHORTNESS OF BREATH *UNUSUAL BRUISING OR BLEEDING *URINARY PROBLEMS (pain or burning when urinating, or frequent urination) *BOWEL PROBLEMS (unusual diarrhea, constipation, pain near the anus) TENDERNESS IN MOUTH AND THROAT WITH OR WITHOUT PRESENCE OF ULCERS (sore throat, sores in mouth, or a toothache) UNUSUAL RASH, SWELLING OR PAIN  UNUSUAL VAGINAL DISCHARGE OR ITCHING   Items with * indicate a potential emergency and should be followed up as soon as possible or go to the Emergency Department if any problems should occur.  Please show the CHEMOTHERAPY ALERT CARD or IMMUNOTHERAPY ALERT CARD at check-in to  the Emergency Department and triage nurse.  Should you have questions after your visit or need to cancel or reschedule your appointment, please contact [facility] [place]  [phone] and follow the prompts.  Office hours are 8:00 a.m. to 4:30 p.m. Monday - Friday. Please note that voicemails left after 4:00 p.m. may not be returned until the following business day.  We are closed weekends and major holidays. You have access to a nurse at all times for urgent questions. Please call the main number to the clinic [phone] and follow the prompts.  For any non-urgent questions, you may also contact your provider using MyChart. We now offer e-Visits for anyone 18 and older to request care online for non-urgent symptoms. For details visit [email or web address].   Also download the MyChart app! Go to the app store, search "MyChart", open the app, select [facility], and log in with your MyChart username and password.  Pembrolizumab Injection What is this medication? PEMBROLIZUMAB (PEM broe LIZ ue mab) treats some types of cancer. It works by helping your immune system slow or stop the spread of cancer cells. It is a monoclonal antibody. This medicine may be used for other purposes; ask your health care provider or pharmacist if you have questions. COMMON BRAND NAME(S): Keytruda What should I tell my care team before I take this medication? They need to know if you have any of these conditions: Allogeneic stem cell transplant (uses someone else's stem cells) Autoimmune diseases, such as Crohn disease, ulcerative colitis, lupus History of chest radiation Nervous system problems, such as Guillain-Barre syndrome, myasthenia gravis   Organ transplant An unusual or allergic reaction to pembrolizumab, other medications, foods, dyes, or preservatives Pregnant or trying to get pregnant Breast-feeding How should I use this medication? This medication is injected into a vein. It  is given by your care team in a hospital or clinic setting. A special MedGuide will be given to you before each treatment. Be sure to read this information carefully each time. Talk to your care team about the use of this medication in children. While it may be prescribed for children as young as 6 months for selected conditions, precautions do apply. Overdosage: If you think you have taken too much of this medicine contact a poison control center or emergency room at once. NOTE: This medicine is only for you. Do not share this medicine with others. What if I miss a dose? Keep appointments for follow-up doses. It is important not to miss your dose. Call your care team if you are unable to keep an appointment. What may interact with this medication? Interactions have not been studied. This list may not describe all possible interactions. Give your health care provider a list of all the medicines, herbs, non-prescription drugs, or dietary supplements you use. Also tell them if you smoke, drink alcohol, or use illegal drugs. Some items may interact with your medicine. What should I watch for while using this medication? Your condition will be monitored carefully while you are receiving this medication. You may need blood work while taking this medication. This medication may cause serious skin reactions. They can happen weeks to months after starting the medication. Contact your care team right away if you notice fevers or flu-like symptoms with a rash. The rash may be red or purple and then turn into blisters or peeling of the skin. You may also notice a red rash with swelling of the face, lips, or lymph nodes in your neck or under your arms. Tell your care team right away if you have any change in your eyesight. Talk to your care team if you may be pregnant. Serious birth defects can occur if you take this medication during pregnancy and for 4 months after the last dose. You will need a negative  pregnancy test before starting this medication. Contraception is recommended while taking this medication and for 4 months after the last dose. Your care team can help you find the option that works for you. Do not breastfeed while taking this medication and for 4 months after the last dose. What side effects may I notice from receiving this medication? Side effects that you should report to your care team as soon as possible: Allergic reactions--skin rash, itching, hives, swelling of the face, lips, tongue, or throat Dry cough, shortness of breath or trouble breathing Eye pain, redness, irritation, or discharge with blurry or decreased vision Heart muscle inflammation--unusual weakness or fatigue, shortness of breath, chest pain, fast or irregular heartbeat, dizziness, swelling of the ankles, feet, or hands Hormone gland problems--headache, sensitivity to light, unusual weakness or fatigue, dizziness, fast or irregular heartbeat, increased sensitivity to cold or heat, excessive sweating, constipation, hair loss, increased thirst or amount of urine, tremors or shaking, irritability Infusion reactions--chest pain, shortness of breath or trouble breathing, feeling faint or lightheaded Kidney injury (glomerulonephritis)--decrease in the amount of urine, red or dark brown urine, foamy or bubbly urine, swelling of the ankles, hands, or feet Liver injury--right upper belly pain, loss of appetite, nausea, light-colored stool, dark yellow or brown urine, yellowing skin or eyes, unusual weakness   or fatigue Pain, tingling, or numbness in the hands or feet, muscle weakness, change in vision, confusion or trouble speaking, loss of balance or coordination, trouble walking, seizures Rash, fever, and swollen lymph nodes Redness, blistering, peeling, or loosening of the skin, including inside the mouth Sudden or severe stomach pain, bloody diarrhea, fever, nausea, vomiting Side effects that usually do not require  medical attention (report to your care team if they continue or are bothersome): Bone, joint, or muscle pain Diarrhea Fatigue Loss of appetite Nausea Skin rash This list may not describe all possible side effects. Call your doctor for medical advice about side effects. You may report side effects to FDA at 1-800-FDA-1088. Where should I keep my medication? This medication is given in a hospital or clinic. It will not be stored at home. NOTE: This sheet is a summary. It may not cover all possible information. If you have questions about this medicine, talk to your doctor, pharmacist, or health care provider.  2024 Elsevier/Gold Standard (2021-11-26 00:00:00)   

## 2023-02-18 NOTE — Progress Notes (Signed)
Patient wants to discuss him having some urinary issues.

## 2023-02-18 NOTE — Assessment & Plan Note (Addendum)
# [  april2021] Stage IV metastatic non-small cell lung cancer favor adeno; mets to bone; liver.   Oligo-metastatic progression-s/p RT right upper lobe mass [s/p dec 15th, 2022].  Currently on Keytruda maintenance.   # June 26th, 2024- CT CAP-  Unchanged post treatment appearance of the right pulmonary apex with fibrotic scarring and volume loss; Unchanged sclerotic, expansile appearance of the posterior right third through fifth ribs consistent with treated metastases; No evidence of lymphadenopathy or soft tissue metastatic disease in the chest, abdomen, or pelvis.  stable   # proceed with [place] maintainance today. Labs today reviewed;  acceptable for treatment today.     # Bone metastases- [[facility], Dr.Parks]. Continue multivitamin with calcium plus vitamin D 3 times a day.  S/p extractions. jan 19th, 2024; s/p dental clearance [April 2024]. vit D 25 OH- march 2024- 70. Currently on Zometa 3 mg q 3 M. stable   # Hypothyroidism- [ Mid sep 2022]  Normal T3-T4.  Currently on 50 mcg a day; JUNE 2024- WNL- stable    # COPD-. On 2-3 lit O2; Trelegy.Clinically- stable   #Incidental findings on Imaging  CT , 2024: Emphysema prostatomegaly atherosclerosis/arteriosclerosisI reviewed/discussed/counseled the patient.   Referral to Dr.Brandon re: prostate enlargement. Check PSA.   HOLD ZOMETA- last 11/26/2022 q 79M;    # DISPOSITION:  # referral to Dr.Brandon re: prostate enlargement # Keytruda today;  # follow up in 3 weeks-MD..  port/labs- cbc/cmp;CEA; PSA-  Keytruda; Zometa ---Dr.B

## 2023-02-18 NOTE — Patient Instructions (Signed)

## 2023-02-20 ENCOUNTER — Encounter: Payer: Self-pay | Admitting: Internal Medicine

## 2023-02-20 DIAGNOSIS — G4700 Insomnia, unspecified: Secondary | ICD-10-CM

## 2023-03-02 DIAGNOSIS — J441 Chronic obstructive pulmonary disease with (acute) exacerbation: Secondary | ICD-10-CM | POA: Diagnosis not present

## 2023-03-02 DIAGNOSIS — J9601 Acute respiratory failure with hypoxia: Secondary | ICD-10-CM | POA: Diagnosis not present

## 2023-03-05 DIAGNOSIS — E039 Hypothyroidism, unspecified: Secondary | ICD-10-CM | POA: Diagnosis not present

## 2023-03-05 DIAGNOSIS — J449 Chronic obstructive pulmonary disease, unspecified: Secondary | ICD-10-CM | POA: Diagnosis not present

## 2023-03-05 DIAGNOSIS — F411 Generalized anxiety disorder: Secondary | ICD-10-CM | POA: Diagnosis not present

## 2023-03-05 DIAGNOSIS — E785 Hyperlipidemia, unspecified: Secondary | ICD-10-CM | POA: Diagnosis not present

## 2023-03-05 DIAGNOSIS — R2681 Unsteadiness on feet: Secondary | ICD-10-CM | POA: Diagnosis not present

## 2023-03-05 DIAGNOSIS — Z809 Family history of malignant neoplasm, unspecified: Secondary | ICD-10-CM | POA: Diagnosis not present

## 2023-03-05 DIAGNOSIS — Z9981 Dependence on supplemental oxygen: Secondary | ICD-10-CM | POA: Diagnosis not present

## 2023-03-05 DIAGNOSIS — G9520 Unspecified cord compression: Secondary | ICD-10-CM | POA: Diagnosis not present

## 2023-03-05 DIAGNOSIS — C787 Secondary malignant neoplasm of liver and intrahepatic bile duct: Secondary | ICD-10-CM | POA: Diagnosis not present

## 2023-03-05 DIAGNOSIS — G4700 Insomnia, unspecified: Secondary | ICD-10-CM | POA: Diagnosis not present

## 2023-03-05 DIAGNOSIS — Z87891 Personal history of nicotine dependence: Secondary | ICD-10-CM | POA: Diagnosis not present

## 2023-03-05 DIAGNOSIS — C7951 Secondary malignant neoplasm of bone: Secondary | ICD-10-CM | POA: Diagnosis not present

## 2023-03-09 DIAGNOSIS — H524 Presbyopia: Secondary | ICD-10-CM | POA: Diagnosis not present

## 2023-03-09 DIAGNOSIS — Z0100 Encounter for examination of eyes and vision without abnormal findings: Secondary | ICD-10-CM | POA: Diagnosis not present

## 2023-03-11 ENCOUNTER — Inpatient Hospital Stay: Payer: Medicare HMO | Admitting: Internal Medicine

## 2023-03-11 ENCOUNTER — Inpatient Hospital Stay: Payer: Medicare HMO

## 2023-03-11 ENCOUNTER — Inpatient Hospital Stay: Payer: Medicare HMO | Attending: Internal Medicine

## 2023-03-11 ENCOUNTER — Encounter: Payer: Self-pay | Admitting: Internal Medicine

## 2023-03-11 VITALS — BP 137/84 | HR 79

## 2023-03-11 VITALS — BP 144/80 | HR 85 | Temp 96.3°F | Ht 69.0 in | Wt 122.4 lb

## 2023-03-11 DIAGNOSIS — C7951 Secondary malignant neoplasm of bone: Secondary | ICD-10-CM | POA: Diagnosis not present

## 2023-03-11 DIAGNOSIS — Z8249 Family history of ischemic heart disease and other diseases of the circulatory system: Secondary | ICD-10-CM | POA: Insufficient documentation

## 2023-03-11 DIAGNOSIS — C787 Secondary malignant neoplasm of liver and intrahepatic bile duct: Secondary | ICD-10-CM | POA: Insufficient documentation

## 2023-03-11 DIAGNOSIS — C3411 Malignant neoplasm of upper lobe, right bronchus or lung: Secondary | ICD-10-CM | POA: Diagnosis not present

## 2023-03-11 DIAGNOSIS — Z87891 Personal history of nicotine dependence: Secondary | ICD-10-CM | POA: Diagnosis not present

## 2023-03-11 DIAGNOSIS — Z7189 Other specified counseling: Secondary | ICD-10-CM

## 2023-03-11 DIAGNOSIS — Z79899 Other long term (current) drug therapy: Secondary | ICD-10-CM | POA: Diagnosis not present

## 2023-03-11 DIAGNOSIS — N400 Enlarged prostate without lower urinary tract symptoms: Secondary | ICD-10-CM | POA: Insufficient documentation

## 2023-03-11 DIAGNOSIS — Z5112 Encounter for antineoplastic immunotherapy: Secondary | ICD-10-CM | POA: Insufficient documentation

## 2023-03-11 DIAGNOSIS — Z79620 Long term (current) use of immunosuppressive biologic: Secondary | ICD-10-CM | POA: Insufficient documentation

## 2023-03-11 DIAGNOSIS — Z801 Family history of malignant neoplasm of trachea, bronchus and lung: Secondary | ICD-10-CM | POA: Diagnosis not present

## 2023-03-11 DIAGNOSIS — Z923 Personal history of irradiation: Secondary | ICD-10-CM | POA: Diagnosis not present

## 2023-03-11 DIAGNOSIS — J439 Emphysema, unspecified: Secondary | ICD-10-CM | POA: Diagnosis not present

## 2023-03-11 DIAGNOSIS — E039 Hypothyroidism, unspecified: Secondary | ICD-10-CM | POA: Diagnosis not present

## 2023-03-11 DIAGNOSIS — Z9981 Dependence on supplemental oxygen: Secondary | ICD-10-CM | POA: Diagnosis not present

## 2023-03-11 DIAGNOSIS — Z823 Family history of stroke: Secondary | ICD-10-CM | POA: Insufficient documentation

## 2023-03-11 LAB — COMPREHENSIVE METABOLIC PANEL
ALT: 12 U/L (ref 0–44)
AST: 17 U/L (ref 15–41)
Albumin: 3.8 g/dL (ref 3.5–5.0)
Alkaline Phosphatase: 55 U/L (ref 38–126)
Anion gap: 9 (ref 5–15)
BUN: 11 mg/dL (ref 8–23)
CO2: 41 mmol/L — ABNORMAL HIGH (ref 22–32)
Calcium: 9.3 mg/dL (ref 8.9–10.3)
Chloride: 90 mmol/L — ABNORMAL LOW (ref 98–111)
Creatinine, Ser: 0.54 mg/dL — ABNORMAL LOW (ref 0.61–1.24)
GFR, Estimated: >60 mL/min (ref >60)
Glucose, Bld: 105 mg/dL — ABNORMAL HIGH (ref 70–99)
Potassium: 4.1 mmol/L (ref 3.5–5.1)
Sodium: 140 mmol/L (ref 135–145)
Total Bilirubin: 0.1 mg/dL — ABNORMAL LOW (ref 0.3–1.2)
Total Protein: 7.4 g/dL (ref 6.5–8.1)

## 2023-03-11 LAB — CBC WITH DIFFERENTIAL/PLATELET
Abs Immature Granulocytes: 0.02 10*3/uL (ref 0.00–0.07)
Basophils Absolute: 0 10*3/uL (ref 0.0–0.1)
Basophils Relative: 1 %
Eosinophils Absolute: 0.4 10*3/uL (ref 0.0–0.5)
Eosinophils Relative: 6 %
HCT: 41.1 % (ref 39.0–52.0)
Hemoglobin: 12.4 g/dL — ABNORMAL LOW (ref 13.0–17.0)
Immature Granulocytes: 0 %
Lymphocytes Relative: 16 %
Lymphs Abs: 1.1 10*3/uL (ref 0.7–4.0)
MCH: 30.2 pg (ref 26.0–34.0)
MCHC: 30.2 g/dL (ref 30.0–36.0)
MCV: 100 fL (ref 80.0–100.0)
Monocytes Absolute: 0.7 10*3/uL (ref 0.1–1.0)
Monocytes Relative: 10 %
Neutro Abs: 4.9 10*3/uL (ref 1.7–7.7)
Neutrophils Relative %: 67 %
Platelets: 151 10*3/uL (ref 150–400)
RBC: 4.11 MIL/uL — ABNORMAL LOW (ref 4.22–5.81)
RDW: 12.1 % (ref 11.5–15.5)
WBC: 7.2 10*3/uL (ref 4.0–10.5)
nRBC: 0 % (ref 0.0–0.2)

## 2023-03-11 LAB — PSA: Prostatic Specific Antigen: 10.34 ng/mL — ABNORMAL HIGH (ref 0.00–4.00)

## 2023-03-11 MED ORDER — HEPARIN SOD (PORK) LOCK FLUSH 100 UNIT/ML IV SOLN
500.0000 [IU] | Freq: Once | INTRAVENOUS | Status: AC | PRN
  Filled 2023-03-11: qty 5

## 2023-03-11 MED ORDER — HEPARIN SOD (PORK) LOCK FLUSH 100 UNIT/ML IV SOLN
INTRAVENOUS | Status: AC
  Filled 2023-03-11: qty 5

## 2023-03-11 MED ORDER — ZOLEDRONIC ACID 4 MG/5ML IV CONC
3.0000 mg | Freq: Once | INTRAVENOUS | Status: AC
  Administered 2023-03-11: 3 mg via INTRAVENOUS
  Filled 2023-03-11: qty 3.75

## 2023-03-11 MED ORDER — SODIUM CHLORIDE 0.9 % IV SOLN
Freq: Once | INTRAVENOUS | Status: AC
  Filled 2023-03-11: qty 250

## 2023-03-11 MED ORDER — SODIUM CHLORIDE 0.9 % IV SOLN
200.0000 mg | Freq: Once | INTRAVENOUS | Status: AC
  Administered 2023-03-11: 200 mg via INTRAVENOUS
  Filled 2023-03-11: qty 8

## 2023-03-11 NOTE — Assessment & Plan Note (Addendum)
# [  april2021] Stage IV metastatic non-small cell lung cancer favor adeno; mets to bone; liver.   Oligo-metastatic progression-s/p RT right upper lobe mass [s/p dec 15th, 2022].  Currently on Keytruda maintenance.   # June 26th, 2024- CT CAP-  Unchanged post treatment appearance of the right pulmonary apex with fibrotic scarring and volume loss; Unchanged sclerotic, expansile appearance of the posterior right third through fifth ribs consistent with treated metastases; No evidence of lymphadenopathy or soft tissue metastatic disease in the chest, abdomen, or pelvis.  stable   # proceed with [place] maintainance today. Labs today reviewed;  acceptable for treatment today.     # Bone metastases- [[facility], Dr.Parks]. Continue multivitamin with calcium plus vitamin D 3 times a day.  S/p extractions. jan 19th, 2024; s/p dental clearance [April 2024]. vit D 25 OH- march 2024- 70. Currently on Zometa 3 mg q 3 M. stable   # Hypothyroidism- [ Mid sep 2022]  Normal T3-T4.  Currently on 50 mcg a day; JUNE 2024- WNL- stable   # COPD-. On 2-3 lit O2; Trelegy.Clinically- stable   # Emphysema prostatomegaly- Referral to Dr.Brandon re: prostate enlargement. Awaiting PSA.   HOLD ZOMETA- last 03/11/2023 q 70M;    # DISPOSITION:  # Keytruda today; zometa today-  # follow up in 3 weeks-MD..  port/labs- cbc/cmp;CEA; Keytruda;---Dr.B

## 2023-03-11 NOTE — Patient Instructions (Signed)
[facility] [facility]  Discharge Instructions: Thank you for choosing [facility] to provide your oncology and hematology care.  If you have a lab appointment with the Cancer Center, please go directly to the Cancer Center and check in at the registration area.  Wear comfortable clothing and clothing appropriate for easy access to any Portacath or PICC line.   We strive to give you quality time with your provider. You may need to reschedule your appointment if you arrive late (15 or more minutes).  Arriving late affects you and other patients whose appointments are after yours.  Also, if you miss three or more appointments without notifying the office, you may be dismissed from the clinic at the provider's discretion.      For prescription refill requests, have your pharmacy contact our office and allow 72 hours for refills to be completed.    Today you received the following chemotherapy and/or immunotherapy agents zometa and Rande Lawman    To help prevent nausea and vomiting after your treatment, we encourage you to take your nausea medication as directed.  BELOW ARE SYMPTOMS THAT SHOULD BE REPORTED IMMEDIATELY: *FEVER GREATER THAN 100.4 F (38 C) OR HIGHER *CHILLS OR SWEATING *NAUSEA AND VOMITING THAT IS NOT CONTROLLED WITH YOUR NAUSEA MEDICATION *UNUSUAL SHORTNESS OF BREATH *UNUSUAL BRUISING OR BLEEDING *URINARY PROBLEMS (pain or burning when urinating, or frequent urination) *BOWEL PROBLEMS (unusual diarrhea, constipation, pain near the anus) TENDERNESS IN MOUTH AND THROAT WITH OR WITHOUT PRESENCE OF ULCERS (sore throat, sores in mouth, or a toothache) UNUSUAL RASH, SWELLING OR PAIN  UNUSUAL VAGINAL DISCHARGE OR ITCHING   Items with * indicate a potential emergency and should be followed up as soon as possible or go to the Emergency Department if any problems should occur.  Please show the CHEMOTHERAPY ALERT CARD or IMMUNOTHERAPY ALERT CARD at  check-in to the Emergency Department and triage nurse.  Should you have questions after your visit or need to cancel or reschedule your appointment, please contact [place] [facility]  [phone] and follow the prompts.  Office hours are 8:00 a.m. to 4:30 p.m. Monday - Friday. Please note that voicemails left after 4:00 p.m. may not be returned until the following business day.  We are closed weekends and major holidays. You have access to a nurse at all times for urgent questions. Please call the main number to the clinic [phone] and follow the prompts.  For any non-urgent questions, you may also contact your provider using MyChart. We now offer e-Visits for anyone 28 and older to request care online for non-urgent symptoms. For details visit [email or web address].   Also download the MyChart app! Go to the app store, search "MyChart", open the app, select [place], and log in with your MyChart username and password.

## 2023-03-11 NOTE — Progress Notes (Signed)
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   # 2021- MAY 26th-Keytrda x2 cycles; [borderline PS] July 7th-carbo Alimta Keytruda cycle #1  #November 2022-oligometastatic progression-right upper lobe subpleural involvement with; s/p radiation finished December 15.  Temporarily held [place].  #Jan fourth 2023-restarted Keytruda.  # NGS/MOLECULAR TESTS:   # PALLIATIVE CARE EVALUATION:  # PAIN MANAGEMENT:    DIAGNOSIS:   STAGE:         ;  GOALS:  CURRENT/MOST RECENT THERAPY :     Cancer of upper lobe of right lung (HCC)  11/23/2019 Initial Diagnosis   Cancer of upper lobe of right lung (HCC)   12/16/2019 -  Chemotherapy   Patient is on Treatment Plan : LUNG Carboplatin (5) + Pemetrexed (500) + Pembrolizumab (200) D1 q21d Induction x 4 cycles / Maintenance Pemetrexed (500) + Pembrolizumab (200) D1 q21d     12/21/2019 - 02/26/2022 Chemotherapy   Patient is on Treatment Plan : LUNG CARBOplatin / Pemetrexed / Pembrolizumab q21d Induction x 4 cycles / Maintenance Pemetrexed + Pembrolizumab      HISTORY OF PRESENTING ILLNESS: Thin built frail appearing male patient.  No acute distress. Alone.  Nasal cannula oxygen.  Ambulating independently.    Cody [name] 67 y.o.  male patient  with metastatic non-small cell lung cancer; cord compression status post decompressive surgery; currently on Keytruda maintenance-is here for follow-up/ CT CAP. Marland Kitchen  Patient  continues to have chronic SOB.  Not any worse.Oxygen dependent. Some fatigue. Pt is able to due all his activities of daily living.   Appetite is almost normal. Denies pain. Denies any dizziness.    Review of Systems  Constitutional:  Positive for malaise/fatigue. Negative for chills, diaphoresis and fever.  HENT:  Negative for nosebleeds and sore throat.   Eyes:  Negative for double vision.  Respiratory:  Positive for shortness of breath. Negative for cough, hemoptysis, sputum production and wheezing.   Cardiovascular:  Negative for chest pain, palpitations, orthopnea and leg swelling.  Gastrointestinal:  Negative for abdominal pain, blood in stool, constipation, heartburn, melena, nausea and vomiting.  Genitourinary:  Negative for dysuria, frequency and urgency.  Musculoskeletal:  Positive for back pain and joint pain.  Skin: Negative.  Negative for itching and rash.  Neurological:  Negative for dizziness, tingling, focal weakness, weakness and headaches.  Endo/Heme/Allergies:  Does not bruise/bleed easily.  Psychiatric/Behavioral:  Negative for depression. The patient is not nervous/anxious and does not have insomnia.      MEDICAL HISTORY:  Past Medical History:  Diagnosis Date   Allergy    Cancer of upper lobe of right lung (HCC) 10/2019   COPD (chronic obstructive pulmonary disease) (HCC)    Prostate enlargement     SURGICAL HISTORY: Past Surgical History:  Procedure Laterality Date   HERNIA REPAIR Bilateral 1982   HERNIA REPAIR Right 1994   IR IMAGING GUIDED PORT INSERTION  02/24/2020   LAMINECTOMY FOR EXCISION / EVACUATION INTRASPINAL LESION  11/07/2019   T1-T 6 at [facility]     SOCIAL HISTORY: Social History   Socioeconomic History   Marital status: Married    Spouse name: [name]    Number of  children: 4   Years of education: Not on file   Highest education level: Some college, no degree  Occupational History   Occupation: dispatch and delivery   Tobacco Use   Smoking status: Former    Current packs/day: 0.00    Average packs/day: 2.0 packs/day for 30.0 years (60.0 ttl pk-yrs)    Types: Cigarettes    Start date: 07/28/1981    Quit date: 07/29/2011    Years since quitting: 11.6   Smokeless tobacco: Never   Tobacco comments:    pt vapes  Vaping Use   Vaping status: Not on file  Substance and Sexual Activity   Alcohol use: Yes    Alcohol/week: 5.0 standard drinks of alcohol    Types: 5 Standard drinks or equivalent per week    Comment: 1-2/day   Drug use: No   Sexual activity: Not Currently    Partners: Female  Other Topics Concern   Not on file  Social History Narrative   Worked in warehouse/ quit smoking in 2015; beer 1/day; in [place]; with wife.    Social Determinants of Health   Financial Resource Strain: Low Risk  (11/15/2022)   Overall Financial Resource Strain (CARDIA)    Difficulty of Paying Living Expenses: Not very hard  Food Insecurity: No Food Insecurity (02/04/2023)   Hunger Vital Sign    Worried About Running Out of Food in the Last Year: Never true    Ran Out of Food in the Last Year: Never true  Transportation Needs: No Transportation Needs (02/04/2023)   PRAPARE - Administrator, Civil Service (Medical): No    Lack of Transportation (Non-Medical): No  Physical Activity: Unknown (11/15/2022)   Exercise Vital Sign    Days of Exercise per Week: Patient declined    Minutes of Exercise per Session: Not on file  Stress: No Stress Concern Present (11/15/2022)   [employer] of Occupational Health - Occupational Stress Questionnaire    Feeling of Stress : Only a little  Social Connections: Unknown (11/15/2022)   Social Connection and Isolation Panel [NHANES]    Frequency of Communication with Friends and Family: Three times a week     Frequency of Social Gatherings with Friends and Family: Patient declined    Attends Religious Services: More than 4 times per year    Active Member of [employer] or Organizations: Not on file    Attends Banker Meetings: Not on file    Marital Status: Married  Intimate Partner Violence: Not At Risk (10/21/2019)   Humiliation, Afraid, Rape, and Kick questionnaire    Fear of Current or Ex-Partner: No    Emotionally Abused: No    Physically Abused: No    Sexually Abused: No    FAMILY HISTORY: Family History  Problem Relation Age of Onset   Heart disease Father    CVA Mother    Lung cancer Sister    Lung cancer Brother     ALLERGIES:  has No Known Allergies.  MEDICATIONS:  Current Outpatient Medications  Medication Sig Dispense Refill   acetaminophen (TYLENOL) 500 MG tablet Take 500-1,000 mg by mouth every 6 (six) hours as needed for mild pain or fever.  albuterol (VENTOLIN HFA) 108 (90 Base) MCG/ACT inhaler Inhale 2 puffs into the lungs every 4 (four) hours as needed for wheezing or shortness of breath. 18 g 0   Calcium Carb-Cholecalciferol (OYSTER SHELL CALCIUM) 500-400 MG-UNIT TABS Take 500 tablets by mouth.     escitalopram (LEXAPRO) 5 MG tablet TAKE 1 TABLET BY MOUTH EVERY MORNING 90 tablet 0   levothyroxine (SYNTHROID) 50 MCG tablet TAKE 1 TABLET BY MOUTH DAILY BEFORE BREAKFAST 90 tablet 2   lidocaine-prilocaine (EMLA) cream Apply 1 application topically as needed. Apply small amount to port site at least 1 hour prior to it being accessed, cover with plastic wrap 30 g 1   Loratadine 10 MG CAPS      Multiple Vitamin (MULTIVITAMIN) tablet Take 1 tablet by mouth daily.     pravastatin (PRAVACHOL) 20 MG tablet Take 1 tablet (20 mg total) by mouth daily. 90 tablet 3   QUEtiapine (SEROQUEL) 25 MG tablet TAKE 1 TABLET BY MOUTH AT BEDTIME 90 tablet 1   No current facility-administered medications for this visit.   Facility-Administered Medications Ordered in Other Visits   Medication Dose Route Frequency Provider Last Rate Last Admin   heparin lock flush 100 UNIT/ML injection               .  PHYSICAL EXAMINATION: ECOG PERFORMANCE STATUS: 3 - Symptomatic, >50% confined to bed  Vitals:   03/11/23 0943  BP: (!) 144/80  Pulse: 85  Temp: (!) 96.3 F (35.7 C)  SpO2: 93%     Filed Weights   03/11/23 0943  Weight: 122 lb 6.4 oz (55.5 kg)       Physical Exam HENT:     Head: Normocephalic and atraumatic.     Mouth/Throat:     Pharynx: No oropharyngeal exudate.  Eyes:     Pupils: Pupils are equal, round, and reactive to light.  Cardiovascular:     Rate and Rhythm: Normal rate and regular rhythm.  Pulmonary:     Effort: No respiratory distress.     Breath sounds: No wheezing.     Comments: Decreased air entry bilaterally. Abdominal:     General: Bowel sounds are normal. There is no distension.     Palpations: Abdomen is soft. There is no mass.     Tenderness: There is no abdominal tenderness. There is no guarding or rebound.  Musculoskeletal:        General: No tenderness. Normal range of motion.     Cervical back: Normal range of motion and neck supple.  Skin:    General: Skin is warm.  Neurological:     Mental Status: He is alert and oriented to person, place, and time.  Psychiatric:        Mood and Affect: Affect normal.      LABORATORY DATA:  I have reviewed the data as listed Lab Results  Component Value Date   WBC 7.2 03/11/2023   HGB 12.4 (L) 03/11/2023   HCT 41.1 03/11/2023   MCV 100.0 03/11/2023   PLT 151 03/11/2023   Recent Labs    01/28/23 1303 02/18/23 0843 03/11/23 0947  NA 137 136 140  K 3.9 3.8 4.1  CL 88* 88* 90*  CO2 43* 42* 41*  GLUCOSE 89 101* 105*  BUN 10 12 11   CREATININE 0.61 0.59* 0.54*  CALCIUM 8.6* 8.7* 9.3  GFRNONAA >60 >60 >60  PROT 7.2 7.4 7.4  ALBUMIN 4.0 4.1 3.8  AST 16 22 17   ALT 10 15  12  ALKPHOS 46 51 55  BILITOT 0.2* 0.3 0.1*    RADIOGRAPHIC STUDIES: I have personally  reviewed the radiological images as listed and agreed with the findings in the report. No results found.   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   Oligo-metastatic progression-s/p RT right upper lobe mass [s/p dec 15th, 2022].  Currently on Keytruda maintenance.   # June 26th, 2024- CT CAP-  Unchanged post treatment appearance of the right pulmonary apex with fibrotic scarring and volume loss; Unchanged sclerotic, expansile appearance of the posterior right third through fifth ribs consistent with treated metastases; No evidence of lymphadenopathy or soft tissue metastatic disease in the chest, abdomen, or pelvis.  stable   # proceed with [place] maintainance today. Labs today reviewed;  acceptable for treatment today.     # Bone metastases- [[facility], Dr.Parks]. Continue multivitamin with calcium plus vitamin D 3 times a day.  S/p extractions. jan 19th, 2024; s/p dental clearance [April 2024]. vit D 25 OH- march 2024- 70. Currently on Zometa 3 mg q 3 M. stable   # Hypothyroidism- [ Mid sep 2022]  Normal T3-T4.  Currently on 50 mcg a day; JUNE 2024- WNL- stable   # COPD-. On 2-3 lit O2; Trelegy.Clinically- stable   # Emphysema prostatomegaly- Referral to Dr.Brandon re: prostate enlargement. Awaiting PSA.   HOLD ZOMETA- last 03/11/2023 q 57M;    # DISPOSITION:  # Keytruda today; zometa today-  # follow up in 3 weeks-MD..  port/labs- cbc/cmp;CEA; Keytruda;---Dr.B    All questions were answered. The patient knows to call the clinic with any problems, questions or concerns.    Earna Coder, MD 03/11/2023 10:53 AM

## 2023-03-11 NOTE — Progress Notes (Signed)
On O2, 4L, all the time. No new or worsening symptoms.  Stopped boost/ensure due to upsetting stomach. Had some diarrhea but has since cleared up.  Does have some trouble sleeping, takes Seroquel.

## 2023-03-12 LAB — CEA: CEA: 2.1 ng/mL (ref 0.0–4.7)

## 2023-03-16 MED ORDER — QUETIAPINE FUMARATE 25 MG PO TABS
25.0000 mg | ORAL_TABLET | Freq: Every day | ORAL | 1 refills | Status: DC
Start: 2023-03-16 — End: 2023-09-08

## 2023-03-17 ENCOUNTER — Ambulatory Visit (INDEPENDENT_AMBULATORY_CARE_PROVIDER_SITE_OTHER): Payer: Medicare HMO | Admitting: Urology

## 2023-03-17 VITALS — BP 154/79 | HR 85 | Ht 69.0 in | Wt 122.5 lb

## 2023-03-17 DIAGNOSIS — R972 Elevated prostate specific antigen [PSA]: Secondary | ICD-10-CM | POA: Diagnosis not present

## 2023-03-17 DIAGNOSIS — R3912 Poor urinary stream: Secondary | ICD-10-CM

## 2023-03-17 DIAGNOSIS — N400 Enlarged prostate without lower urinary tract symptoms: Secondary | ICD-10-CM | POA: Diagnosis not present

## 2023-03-17 DIAGNOSIS — N401 Enlarged prostate with lower urinary tract symptoms: Secondary | ICD-10-CM | POA: Diagnosis not present

## 2023-03-17 LAB — MICROSCOPIC EXAMINATION: Bacteria, UA: NONE SEEN

## 2023-03-17 LAB — URINALYSIS, COMPLETE
Bilirubin, UA: NEGATIVE
Glucose, UA: NEGATIVE
Ketones, UA: NEGATIVE
Leukocytes,UA: NEGATIVE
Nitrite, UA: NEGATIVE
RBC, UA: NEGATIVE
Specific Gravity, UA: 1.025 (ref 1.005–1.030)
Urobilinogen, Ur: 0.2 mg/dL (ref 0.2–1.0)
pH, UA: 6.5 (ref 5.0–7.5)

## 2023-03-17 LAB — BLADDER SCAN AMB NON-IMAGING: Scan Result: 49

## 2023-03-17 MED ORDER — FINASTERIDE 5 MG PO TABS
5.0000 mg | ORAL_TABLET | Freq: Every day | ORAL | 0 refills | Status: DC
Start: 2023-03-17 — End: 2023-06-11

## 2023-03-17 MED ORDER — TAMSULOSIN HCL 0.4 MG PO CAPS
0.4000 mg | ORAL_CAPSULE | Freq: Every day | ORAL | 0 refills | Status: DC
Start: 2023-03-17 — End: 2023-06-11

## 2023-03-17 NOTE — Progress Notes (Signed)
I,Amy L Pierron,acting as a scribe for Vanna Scotland, MD.,have documented all relevant documentation on the behalf of Vanna Scotland, MD,as directed by  Vanna Scotland, MD while in the presence of Vanna Scotland, MD.  03/17/2023 3:46 PM   Cody [name] 03-23-1956 [id number]  Referring provider: Earna Coder, MD [address] [place],  [place] [address]  Chief Complaint  Patient presents with   Establish Care   Benign Prostatic Hypertrophy    HPI: 67 year-old male presents today for re-evaluation of BPH.  He was last seen here in 2017 for the same issue. At that point, he was known to have an elevated PSA of 5.3. He underwent prostate biopsy in 2016, which was negative. His TRUS volume at the time was 68.  Unfortunately, in 2021, he was diagnosed with right lung cancer. 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 He's been on maintenance immunotherapy.  He has also struggled with urinary intermittency and weak stream and was placed on Flomax. However, it appears he is no longer taking that medication currently.  His most recent labs were done by Dr. Donneta Romberg on 03/11/2023 and his PSA was 10.34. His most recent cross-sectional imaging was in the form of CT chest, abdomen, pelvis on 01/21/2023. It shows sequelae of stage four metastatic lung cancer, and incidental prostamegaly with bladder lower wall thickening consistent with bladder outlet obstruction.   He reports continuing with his immunotherapy treatments every 3 weeks. Otherwise he is doing well. He spends a lot of time with his family.   IPSS     Row Name 03/17/23 1500         International Prostate Symptom Score   How often have you had the sensation of not emptying your bladder? More than half the time     How often have you had to urinate less than every two hours? About half the time     How often have you found you stopped and started again several times when you urinated? Less than 1 in 5  times     How often have you found it difficult to postpone urination? Less than half the time     How often have you had a weak urinary stream? Almost always     How often have you had to strain to start urination? Less than 1 in 5 times     How many times did you typically get up at night to urinate? 1 Time     Total IPSS Score 17       Quality of Life due to urinary symptoms   If you were to spend the rest of your life with your urinary condition just the way it is now how would you feel about that? Mostly Disatisfied            Score:  1-7 Mild 8-19 Moderate 20-35 Severe Results for orders placed or performed in visit on 03/17/23  Bladder Scan (Post Void Residual) in office  Result Value Ref Range   Scan Result 49 ml     PMH: Past Medical History:  Diagnosis Date   Allergy    Cancer of upper lobe of right lung (HCC) 10/2019   COPD (chronic obstructive pulmonary disease) (HCC)    Prostate enlargement     Surgical History: Past Surgical History:  Procedure Laterality Date   HERNIA REPAIR Bilateral 1982   HERNIA REPAIR Right 1994   IR IMAGING GUIDED PORT INSERTION  02/24/2020  LAMINECTOMY FOR EXCISION / EVACUATION INTRASPINAL LESION  11/07/2019   T1-T 6 at [facility] Medications:  Allergies as of 03/17/2023   No Known Allergies      Medication List        Accurate as of March 17, 2023  3:46 PM. If you have any questions, ask your nurse or doctor.          acetaminophen 500 MG tablet Commonly known as: TYLENOL Take 500-1,000 mg by mouth every 6 (six) hours as needed for mild pain or fever.   albuterol 108 (90 Base) MCG/ACT inhaler Commonly known as: VENTOLIN HFA Inhale 2 puffs into the lungs every 4 (four) hours as needed for wheezing or shortness of breath.   escitalopram 5 MG tablet Commonly known as: LEXAPRO TAKE 1 TABLET BY MOUTH EVERY MORNING   finasteride 5 MG tablet Commonly known as: PROSCAR Take 1 tablet (5 mg total) by mouth  daily.   levothyroxine 50 MCG tablet Commonly known as: SYNTHROID TAKE 1 TABLET BY MOUTH DAILY BEFORE BREAKFAST   lidocaine-prilocaine cream Commonly known as: EMLA Apply 1 application topically as needed. Apply small amount to port site at least 1 hour prior to it being accessed, cover with plastic wrap   Loratadine 10 MG Caps   multivitamin tablet Take 1 tablet by mouth daily.   Oyster Shell Calcium 500-400 MG-UNIT Tabs Take 500 tablets by mouth.   pravastatin 20 MG tablet Commonly known as: PRAVACHOL Take 1 tablet (20 mg total) by mouth daily.   QUEtiapine 25 MG tablet Commonly known as: SEROQUEL Take 1 tablet (25 mg total) by mouth at bedtime.   tamsulosin 0.4 MG Caps capsule Commonly known as: FLOMAX Take 1 capsule (0.4 mg total) by mouth daily.        Family History: Family History  Problem Relation Age of Onset   Heart disease Father    CVA Mother    Lung cancer Sister    Lung cancer Brother     Social History:  reports that he quit smoking about 11 years ago. His smoking use included cigarettes. He started smoking about 41 years ago. He has a 60 pack-year smoking history. He has never used smokeless tobacco. He reports current alcohol use of about 5.0 standard drinks of alcohol per week. He reports that he does not use drugs.   Physical Exam: BP (!) 154/79   Pulse 85   Ht 5\' 9"  (1.753 m)   Wt 122 lb 8 oz (55.6 kg)   BMI 18.09 kg/m   Constitutional:  Alert and oriented, No acute distress. HEENT: [place] AT, moist mucus membranes.  Trachea midline, no masses. Neurologic: Grossly intact, no focal deficits, moving all 4 extremities. Psychiatric: Normal mood and affect.   Pertinent Imaging: EXAM: CT CHEST, ABDOMEN, AND PELVIS WITH CONTRAST   TECHNIQUE: Multidetector CT imaging of the chest, abdomen and pelvis was performed following the standard protocol during bolus administration of intravenous contrast.   RADIATION DOSE REDUCTION: This exam was  performed according to the departmental dose-optimization program which includes automated exposure control, adjustment of the mA and/or kV according to patient size and/or use of iterative reconstruction technique.   CONTRAST:  80mL OMNIPAQUE IOHEXOL 300 MG/ML  SOLN   COMPARISON:  10/10/2022   FINDINGS: CT CHEST FINDINGS   Cardiovascular: Right chest port catheter. Aortic atherosclerosis. Normal heart size. Left coronary artery calcifications. No pericardial effusion.   Mediastinum/Nodes: No enlarged mediastinal, hilar, or axillary lymph nodes. Thyroid  gland, trachea, and esophagus demonstrate no significant findings.   Lungs/Pleura: Severe emphysema. Diffuse bilateral bronchial wall thickening. Unchanged post treatment appearance of the right pulmonary apex with fibrotic scarring and volume loss. No pleural effusion or pneumothorax.   Musculoskeletal: Unchanged sclerotic, expansile appearance of the posterior right third through fifth ribs (series 3, image 14). Status post laminectomy and posterior fusion of T1 through T6, bridging sclerotic pathologic fractures of T3 and T4 (series 6, image 78). No acute osseous findings.   CT ABDOMEN PELVIS FINDINGS   Hepatobiliary: No solid liver abnormality is seen. No gallstones, gallbladder wall thickening, or biliary dilatation.   Pancreas: Unremarkable. No pancreatic ductal dilatation or surrounding inflammatory changes.   Spleen: Normal in size without significant abnormality.   Adrenals/Urinary Tract: Adrenal glands are unremarkable. 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 Kidneys are otherwise normal, without renal calculi, solid lesion, or hydronephrosis. Diffuse urinary bladder wall thickening.   Stomach/Bowel: Stomach is within normal limits.  Appendix appears normal. No evidence of bowel wall thickening, distention, or inflammatory changes. Sigmoid diverticula.   Vascular/Lymphatic: Aortic atherosclerosis. No enlarged abdominal or pelvic lymph nodes.   Reproductive: Severe prostatomegaly.   Other: Evidence of right inguinal hernia repair. Unchanged superficial subcutaneous inclusion cyst of the right groin (series 2, image 109). No ascites.   Musculoskeletal: No acute osseous findings.   IMPRESSION: 1. Unchanged post treatment appearance of the right pulmonary apex with fibrotic scarring and volume loss. 2. Unchanged sclerotic, expansile appearance of the posterior right third through fifth ribs consistent with treated metastases. Status post laminectomy and posterior fusion of T1 through T6, bridging sclerotic pathologic metastatic fractures of T3 and T4. 3. No evidence of lymphadenopathy or soft tissue metastatic disease in the chest, abdomen, or pelvis. 4. Severe emphysema and diffuse bilateral bronchial wall thickening. 5. Coronary artery disease. 6. Severe prostatomegaly with diffuse urinary bladder wall thickening, consistent with chronic outlet obstruction.   Aortic Atherosclerosis (ICD10-I70.0) and Emphysema (ICD10-J43.9).   Electronically Signed   By: Jearld Lesch M.D.   On: 01/21/2023 12:48 Personally reviewed this scan and agree with radiologic interpretation.    Assessment & Plan:    Elevated PSA/ BPH  - Based on his history of elevated PSA and significant prostatomegaly, this PSA is likely appropriate in the setting of stage four lung cancer. Threshold for pursuing biopsy is fairly high. It's unlikely that this actually represents prostate cancer.   - Put him back on Flomax for treatment of his urinary symptoms. Also prescribed Finasteride. Plan to reassess his urinary symptoms in six months as well his PSA. It should decrease by half which would also be supportive of a non-malignant process.  -  Deferred rectal exam today.  Return in about 6 months (around 09/17/2023) for PSA.  [facility] [address] [place], [place] [address] [phone]

## 2023-03-31 NOTE — Progress Notes (Signed)
[facility] CONSULT NOTE  Patient Care Team: Alba Cory, MD as PCP - General (Family Medicine) Kieth Brightly, MD (General Surgery) Glory Buff, RN as Oncology Nurse Navigator Earna Coder, MD as Consulting Physician (Hematology and Oncology)  CHIEF COMPLAINTS/PURPOSE OF CONSULTATION: Lung cancer  #  Oncology History Overview Note  # April 2021- RUL lung cancer; liver met; spinal cord compression/ vertebral mets [[facility]]; [[place]]; LIVER Bx- Metastatic adenocarcinoma, consistent with lung primary.- TPS % Interpretation -PD-L1 IHC 10 LOW EXPRESSION POSITIVE   # 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   # 2021- MAY 26th-Keytrda x2 cycles; [borderline PS] July 7th-carbo Alimta Keytruda cycle #1  #November 2022-oligometastatic progression-right upper lobe subpleural involvement with; s/p radiation finished December 15.  Temporarily held [place].  #Jan fourth 2023-restarted Keytruda.  # NGS/MOLECULAR TESTS:   # PALLIATIVE CARE EVALUATION:  # PAIN MANAGEMENT:    DIAGNOSIS:   STAGE:         ;  GOALS:  CURRENT/MOST RECENT THERAPY :     Cancer of upper lobe of right lung (HCC)  11/23/2019 Initial Diagnosis   Cancer of upper lobe of right lung (HCC)   12/16/2019 -  Chemotherapy   Patient is on Treatment Plan : LUNG Carboplatin (5) + Pemetrexed (500) + Pembrolizumab (200) D1 q21d Induction x 4 cycles / Maintenance Pemetrexed (500) + Pembrolizumab (200) D1 q21d     12/21/2019 - 02/26/2022 Chemotherapy   Patient is on Treatment Plan : LUNG CARBOplatin / Pemetrexed / Pembrolizumab q21d Induction x 4 cycles / Maintenance Pemetrexed + Pembrolizumab      HISTORY OF PRESENTING ILLNESS: Thin built frail appearing male patient.  No acute distress. Alone.  Nasal cannula oxygen.  Ambulating independently.    Cody [name] 67 y.o.  male patient  with metastatic non-small cell lung cancer; cord compression status post decompressive surgery; currently on Keytruda maintenance-is here for follow-up.   Patient  continues to have chronic SOB.  Not any worse.Oxygen dependent. Some fatigue. Pt is able to due all his activities of daily living.  Appetite is almost normal. Denies pain. Denies any dizziness.    Review of Systems  Constitutional:  Positive for malaise/fatigue. Negative for chills, diaphoresis and fever.  HENT:  Negative for nosebleeds and sore throat.   Eyes:  Negative for double vision.  Respiratory:  Positive for shortness of breath. Negative for cough, hemoptysis, sputum production and wheezing.   Cardiovascular:  Negative for chest pain, palpitations, orthopnea and leg swelling.  Gastrointestinal:  Negative for abdominal pain, blood in stool, constipation, heartburn, melena, nausea and vomiting.  Genitourinary:  Negative for dysuria, frequency and urgency.  Musculoskeletal:  Positive for back pain and joint pain.  Skin: Negative.  Negative for itching and rash.  Neurological:  Negative for dizziness, tingling, focal weakness, weakness and headaches.  Endo/Heme/Allergies:  Does not bruise/bleed easily.  Psychiatric/Behavioral:  Negative for depression. The patient is not nervous/anxious and does not have insomnia.      MEDICAL HISTORY:  Past Medical History:  Diagnosis Date   Allergy    Cancer of upper lobe of right lung (HCC) 10/2019   COPD (chronic obstructive pulmonary disease) (HCC)    Prostate enlargement     SURGICAL HISTORY: Past Surgical History:  Procedure Laterality Date   HERNIA REPAIR Bilateral 1982   HERNIA REPAIR Right 1994   IR IMAGING GUIDED PORT INSERTION  02/24/2020  LAMINECTOMY FOR EXCISION / EVACUATION INTRASPINAL LESION  11/07/2019   T1-T 6 at [facility]     SOCIAL HISTORY: Social History   Socioeconomic History   Marital status: Married    Spouse name: Cody [name]    Number of children: 4    Years of education: Not on file   Highest education level: Some college, no degree  Occupational History   Occupation: dispatch and delivery   Tobacco Use   Smoking status: Former    Current packs/day: 0.00    Average packs/day: 2.0 packs/day for 30.0 years (60.0 ttl pk-yrs)    Types: Cigarettes    Start date: 07/28/1981    Quit date: 07/29/2011    Years since quitting: 11.6   Smokeless tobacco: Never   Tobacco comments:    pt vapes  Vaping Use   Vaping status: Not on file  Substance and Sexual Activity   Alcohol use: Yes    Alcohol/week: 5.0 standard drinks of alcohol    Types: 5 Standard drinks or equivalent per week    Comment: 1-2/day   Drug use: No   Sexual activity: Not Currently    Partners: Female  Other Topics Concern   Not on file  Social History Narrative   Worked in warehouse/ quit smoking in 2015; beer 1/day; in [place]; with wife.    Social Determinants of Health   Financial Resource Strain: Low Risk  (11/15/2022)   Overall Financial Resource Strain (CARDIA)    Difficulty of Paying Living Expenses: Not very hard  Food Insecurity: No Food Insecurity (02/04/2023)   Hunger Vital Sign    Worried About Running Out of Food in the Last Year: Never true    Ran Out of Food in the Last Year: Never true  Transportation Needs: No Transportation Needs (02/04/2023)   PRAPARE - Administrator, Civil Service (Medical): No    Lack of Transportation (Non-Medical): No  Physical Activity: Unknown (11/15/2022)   Exercise Vital Sign    Days of Exercise per Week: Patient declined    Minutes of Exercise per Session: Not on file  Stress: No Stress Concern Present (11/15/2022)   [employer] of Occupational Health - Occupational Stress Questionnaire    Feeling of Stress : Only a little  Social Connections: Unknown (11/15/2022)   Social Connection and Isolation Panel [NHANES]    Frequency of Communication with Friends and Family: Three times a week    Frequency of  Social Gatherings with Friends and Family: Patient declined    Attends Religious Services: More than 4 times per year    Active Member of [employer] or Organizations: Not on file    Attends Banker Meetings: Not on file    Marital Status: Married  Intimate Partner Violence: Not At Risk (10/21/2019)   Humiliation, Afraid, Rape, and Kick questionnaire    Fear of Current or Ex-Partner: No    Emotionally Abused: No    Physically Abused: No    Sexually Abused: No    FAMILY HISTORY: Family History  Problem Relation Age of Onset   Heart disease Father    CVA Mother    Lung cancer Sister    Lung cancer Brother     ALLERGIES:  has No Known Allergies.  MEDICATIONS:  Current Outpatient Medications  Medication Sig Dispense Refill   acetaminophen (TYLENOL) 500 MG tablet Take 500-1,000 mg by mouth every 6 (six) hours as needed for mild pain or fever.      albuterol (VENTOLIN HFA)  108 (90 Base) MCG/ACT inhaler Inhale 2 puffs into the lungs every 4 (four) hours as needed for wheezing or shortness of breath. 18 g 0   Calcium Carb-Cholecalciferol (OYSTER SHELL CALCIUM) 500-400 MG-UNIT TABS Take 500 tablets by mouth.     escitalopram (LEXAPRO) 5 MG tablet TAKE 1 TABLET BY MOUTH EVERY MORNING 90 tablet 0   finasteride (PROSCAR) 5 MG tablet Take 1 tablet (5 mg total) by mouth daily. 90 tablet 0   levothyroxine (SYNTHROID) 50 MCG tablet TAKE 1 TABLET BY MOUTH DAILY BEFORE BREAKFAST 90 tablet 2   lidocaine-prilocaine (EMLA) cream Apply 1 application topically as needed. Apply small amount to port site at least 1 hour prior to it being accessed, cover with plastic wrap 30 g 1   Loratadine 10 MG CAPS      Multiple Vitamin (MULTIVITAMIN) tablet Take 1 tablet by mouth daily.     pravastatin (PRAVACHOL) 20 MG tablet Take 1 tablet (20 mg total) by mouth daily. 90 tablet 3   QUEtiapine (SEROQUEL) 25 MG tablet Take 1 tablet (25 mg total) by mouth at bedtime. 90 tablet 1   tamsulosin (FLOMAX) 0.4 MG  CAPS capsule Take 1 capsule (0.4 mg total) by mouth daily. 90 capsule 0   No current facility-administered medications for this visit.   Facility-Administered Medications Ordered in Other Visits  Medication Dose Route Frequency Provider Last Rate Last Admin   heparin lock flush 100 UNIT/ML injection               .  PHYSICAL EXAMINATION: ECOG PERFORMANCE STATUS: 3 - Symptomatic, >50% confined to bed  Vitals:   04/01/23 0859  BP: (!) 143/73  Pulse: 88  Temp: (!) 97 F (36.1 C)  SpO2: 100%      Filed Weights   04/01/23 0859  Weight: 123 lb (55.8 kg)        Physical Exam HENT:     Head: Normocephalic and atraumatic.     Mouth/Throat:     Pharynx: No oropharyngeal exudate.  Eyes:     Pupils: Pupils are equal, round, and reactive to light.  Cardiovascular:     Rate and Rhythm: Normal rate and regular rhythm.  Pulmonary:     Effort: No respiratory distress.     Breath sounds: No wheezing.     Comments: Decreased air entry bilaterally. Abdominal:     General: Bowel sounds are normal. There is no distension.     Palpations: Abdomen is soft. There is no mass.     Tenderness: There is no abdominal tenderness. There is no guarding or rebound.  Musculoskeletal:        General: No tenderness. Normal range of motion.     Cervical back: Normal range of motion and neck supple.  Skin:    General: Skin is warm.  Neurological:     Mental Status: He is alert and oriented to person, place, and time.  Psychiatric:        Mood and Affect: Affect normal.      LABORATORY DATA:  I have reviewed the data as listed Lab Results  Component Value Date   WBC 7.2 03/11/2023   HGB 12.4 (L) 03/11/2023   HCT 41.1 03/11/2023   MCV 100.0 03/11/2023   PLT 151 03/11/2023   Recent Labs    01/28/23 1303 02/18/23 0843 03/11/23 0947  NA 137 136 140  K 3.9 3.8 4.1  CL 88* 88* 90*  CO2 43* 42* 41*  GLUCOSE 89 101* 105*  BUN 10 12 11   CREATININE 0.61 0.59* 0.54*  CALCIUM 8.6*  8.7* 9.3  GFRNONAA >60 >60 >60  PROT 7.2 7.4 7.4  ALBUMIN 4.0 4.1 3.8  AST 16 22 17   ALT 10 15 12   ALKPHOS 46 51 55  BILITOT 0.2* 0.3 0.1*    RADIOGRAPHIC STUDIES: I have personally reviewed the radiological images as listed and agreed with the findings in the report. No results found.   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   Oligo-metastatic progression-s/p RT right upper lobe mass [s/p dec 15th, 2022].  Currently on Keytruda maintenance.   # June 26th, 2024- CT CAP-  Unchanged post treatment appearance of the right pulmonary apex with fibrotic scarring and volume loss; Unchanged sclerotic, expansile appearance of the posterior right third through fifth ribs consistent with treated metastases; No evidence of lymphadenopathy or soft tissue metastatic disease in the chest, abdomen, or pelvis.  stable   # proceed with [place] maintainance today. Labs today reviewed;  acceptable for treatment today.    stable   # Bone metastases- [[facility], Dr.Parks]. Continue multivitamin with calcium plus vitamin D 3 times a day.  S/p extractions. jan 19th, 2024; s/p dental clearance [April 2024]. vit D 25 [place]- march 2024- 70. Currently on Zometa 3 mg q 3 M. stable   # Hypothyroidism- [ Mid sep 2022]  Normal T3-T4.  Currently on 50 mcg a day; JUNE 2024- WNL- stable   # COPD-. On 2-3 lit O2; Trelegy.Clinically- stable   # Emphysema prostatomegaly- s/p  Dr.Brandon re: prostate enlargement- on Flomax/Proscar [Dr.Bradon]- PSA- 10.3- stable.   HOLD ZOMETA- last 03/11/2023 q 91M;    # DISPOSITION:  # Keytruda today-wait for CMP/CBC-  # follow up in 3 weeks-MD..  port/labs- cbc/cmp;CEA; Keytruda;---Dr.B    All questions were answered. The patient knows to call the clinic with any problems, questions or concerns.    Earna Coder, MD 04/01/2023 9:31 AM

## 2023-03-31 NOTE — Assessment & Plan Note (Addendum)
# [  april2021] Stage IV metastatic non-small cell lung cancer favor adeno; mets to bone; liver.   Oligo-metastatic progression-s/p RT right upper lobe mass [s/p dec 15th, 2022].  Currently on Keytruda maintenance.   # June 26th, 2024- CT CAP-  Unchanged post treatment appearance of the right pulmonary apex with fibrotic scarring and volume loss; Unchanged sclerotic, expansile appearance of the posterior right third through fifth ribs consistent with treated metastases; No evidence of lymphadenopathy or soft tissue metastatic disease in the chest, abdomen, or pelvis.  stable   # proceed with [place] maintainance today. Labs today reviewed;  acceptable for treatment today.    stable   # Bone metastases- [[facility], Dr.Parks]. Continue multivitamin with calcium plus vitamin D 3 times a day.  S/p extractions. jan 19th, 2024; s/p dental clearance [April 2024]. vit D 25 [place]- march 2024- 70. Currently on Zometa 3 mg q 3 M. stable   # Hypothyroidism- [ Mid sep 2022]  Normal T3-T4.  Currently on 50 mcg a day; JUNE 2024- WNL- stable   # COPD-. On 2-3 lit O2; Trelegy.Clinically- stable   # Emphysema prostatomegaly- s/p  Dr.Brandon re: prostate enlargement- on Flomax/Proscar [Dr.Bradon]- PSA- 10.3- stable.   HOLD ZOMETA- last 03/11/2023 q 39M;    # DISPOSITION:  # Keytruda today-wait for CMP/CBC-  # follow up in 3 weeks-MD..  port/labs- cbc/cmp;CEA; Keytruda;---Dr.B

## 2023-04-01 ENCOUNTER — Encounter: Payer: Self-pay | Admitting: Internal Medicine

## 2023-04-01 ENCOUNTER — Inpatient Hospital Stay: Payer: Medicare HMO

## 2023-04-01 ENCOUNTER — Inpatient Hospital Stay: Payer: Medicare HMO | Attending: Internal Medicine

## 2023-04-01 ENCOUNTER — Inpatient Hospital Stay: Payer: Medicare HMO | Admitting: Internal Medicine

## 2023-04-01 VITALS — BP 143/73 | HR 88 | Temp 97.0°F | Wt 123.0 lb

## 2023-04-01 DIAGNOSIS — E039 Hypothyroidism, unspecified: Secondary | ICD-10-CM | POA: Diagnosis not present

## 2023-04-01 DIAGNOSIS — Z8249 Family history of ischemic heart disease and other diseases of the circulatory system: Secondary | ICD-10-CM | POA: Diagnosis not present

## 2023-04-01 DIAGNOSIS — Z7189 Other specified counseling: Secondary | ICD-10-CM | POA: Diagnosis not present

## 2023-04-01 DIAGNOSIS — N400 Enlarged prostate without lower urinary tract symptoms: Secondary | ICD-10-CM | POA: Insufficient documentation

## 2023-04-01 DIAGNOSIS — C787 Secondary malignant neoplasm of liver and intrahepatic bile duct: Secondary | ICD-10-CM | POA: Insufficient documentation

## 2023-04-01 DIAGNOSIS — C3411 Malignant neoplasm of upper lobe, right bronchus or lung: Secondary | ICD-10-CM

## 2023-04-01 DIAGNOSIS — M549 Dorsalgia, unspecified: Secondary | ICD-10-CM | POA: Insufficient documentation

## 2023-04-01 DIAGNOSIS — Z801 Family history of malignant neoplasm of trachea, bronchus and lung: Secondary | ICD-10-CM | POA: Diagnosis not present

## 2023-04-01 DIAGNOSIS — Z5112 Encounter for antineoplastic immunotherapy: Secondary | ICD-10-CM | POA: Insufficient documentation

## 2023-04-01 DIAGNOSIS — R0602 Shortness of breath: Secondary | ICD-10-CM | POA: Diagnosis not present

## 2023-04-01 DIAGNOSIS — J449 Chronic obstructive pulmonary disease, unspecified: Secondary | ICD-10-CM | POA: Diagnosis not present

## 2023-04-01 DIAGNOSIS — Z79890 Hormone replacement therapy (postmenopausal): Secondary | ICD-10-CM | POA: Insufficient documentation

## 2023-04-01 DIAGNOSIS — Z923 Personal history of irradiation: Secondary | ICD-10-CM | POA: Insufficient documentation

## 2023-04-01 DIAGNOSIS — Z823 Family history of stroke: Secondary | ICD-10-CM | POA: Insufficient documentation

## 2023-04-01 DIAGNOSIS — Z87891 Personal history of nicotine dependence: Secondary | ICD-10-CM | POA: Diagnosis not present

## 2023-04-01 DIAGNOSIS — Z79899 Other long term (current) drug therapy: Secondary | ICD-10-CM | POA: Diagnosis not present

## 2023-04-01 DIAGNOSIS — Z79620 Long term (current) use of immunosuppressive biologic: Secondary | ICD-10-CM | POA: Diagnosis not present

## 2023-04-01 DIAGNOSIS — M2550 Pain in unspecified joint: Secondary | ICD-10-CM | POA: Insufficient documentation

## 2023-04-01 DIAGNOSIS — C7951 Secondary malignant neoplasm of bone: Secondary | ICD-10-CM | POA: Insufficient documentation

## 2023-04-01 DIAGNOSIS — Z9981 Dependence on supplemental oxygen: Secondary | ICD-10-CM | POA: Diagnosis not present

## 2023-04-01 DIAGNOSIS — R5383 Other fatigue: Secondary | ICD-10-CM | POA: Diagnosis not present

## 2023-04-01 LAB — COMPREHENSIVE METABOLIC PANEL
ALT: 14 U/L (ref 0–44)
AST: 21 U/L (ref 15–41)
Albumin: 4 g/dL (ref 3.5–5.0)
Alkaline Phosphatase: 50 U/L (ref 38–126)
Anion gap: 7 (ref 5–15)
BUN: 14 mg/dL (ref 8–23)
CO2: 41 mmol/L — ABNORMAL HIGH (ref 22–32)
Calcium: 8.9 mg/dL (ref 8.9–10.3)
Chloride: 89 mmol/L — ABNORMAL LOW (ref 98–111)
Creatinine, Ser: 0.47 mg/dL — ABNORMAL LOW (ref 0.61–1.24)
GFR, Estimated: >60 mL/min (ref >60)
Glucose, Bld: 109 mg/dL — ABNORMAL HIGH (ref 70–99)
Potassium: 4.1 mmol/L (ref 3.5–5.1)
Sodium: 137 mmol/L (ref 135–145)
Total Bilirubin: 0.3 mg/dL (ref 0.3–1.2)
Total Protein: 7.1 g/dL (ref 6.5–8.1)

## 2023-04-01 LAB — CBC WITH DIFFERENTIAL/PLATELET
Abs Immature Granulocytes: 0.02 10*3/uL (ref 0.00–0.07)
Basophils Absolute: 0 10*3/uL (ref 0.0–0.1)
Basophils Relative: 1 %
Eosinophils Absolute: 0.4 10*3/uL (ref 0.0–0.5)
Eosinophils Relative: 7 %
HCT: 39.9 % (ref 39.0–52.0)
Hemoglobin: 12.1 g/dL — ABNORMAL LOW (ref 13.0–17.0)
Immature Granulocytes: 0 %
Lymphocytes Relative: 19 %
Lymphs Abs: 1.1 10*3/uL (ref 0.7–4.0)
MCH: 29.9 pg (ref 26.0–34.0)
MCHC: 30.3 g/dL (ref 30.0–36.0)
MCV: 98.5 fL (ref 80.0–100.0)
Monocytes Absolute: 0.6 10*3/uL (ref 0.1–1.0)
Monocytes Relative: 10 %
Neutro Abs: 3.9 10*3/uL (ref 1.7–7.7)
Neutrophils Relative %: 63 %
Platelets: 161 10*3/uL (ref 150–400)
RBC: 4.05 MIL/uL — ABNORMAL LOW (ref 4.22–5.81)
RDW: 12.2 % (ref 11.5–15.5)
WBC: 6 10*3/uL (ref 4.0–10.5)
nRBC: 0 % (ref 0.0–0.2)

## 2023-04-01 MED ORDER — HEPARIN SOD (PORK) LOCK FLUSH 100 UNIT/ML IV SOLN
500.0000 [IU] | Freq: Once | INTRAVENOUS | Status: DC | PRN
  Filled 2023-04-01: qty 5

## 2023-04-01 MED ORDER — SODIUM CHLORIDE 0.9 % IV SOLN
200.0000 mg | Freq: Once | INTRAVENOUS | Status: AC
  Administered 2023-04-01: 200 mg via INTRAVENOUS
  Filled 2023-04-01: qty 8

## 2023-04-01 MED ORDER — SODIUM CHLORIDE 0.9 % IV SOLN
Freq: Once | INTRAVENOUS | Status: AC
  Filled 2023-04-01: qty 250

## 2023-04-01 NOTE — Addendum Note (Signed)
Addended by: Clydia Llano on: 04/01/2023 10:59 AM   Modules accepted: Orders

## 2023-04-01 NOTE — Patient Instructions (Signed)
[facility] [place] [facility]  Discharge Instructions: Thank you for choosing [facility] to provide your oncology and hematology care.  If you have a lab appointment with the Cancer Center, please go directly to the Cancer Center and check in at the registration area.  Wear comfortable clothing and clothing appropriate for easy access to any Portacath or PICC line.   We strive to give you quality time with your provider. You may need to reschedule your appointment if you arrive late (15 or more minutes).  Arriving late affects you and other patients whose appointments are after yours.  Also, if you miss three or more appointments without notifying the office, you may be dismissed from the clinic at the provider's discretion.      For prescription refill requests, have your pharmacy contact our office and allow 72 hours for refills to be completed.    Today you received the following chemotherapy and/or immunotherapy agents- Keytruda      To help prevent nausea and vomiting after your treatment, we encourage you to take your nausea medication as directed.  BELOW ARE SYMPTOMS THAT SHOULD BE REPORTED IMMEDIATELY: *FEVER GREATER THAN 100.4 F (38 C) OR HIGHER *CHILLS OR SWEATING *NAUSEA AND VOMITING THAT IS NOT CONTROLLED WITH YOUR NAUSEA MEDICATION *UNUSUAL SHORTNESS OF BREATH *UNUSUAL BRUISING OR BLEEDING *URINARY PROBLEMS (pain or burning when urinating, or frequent urination) *BOWEL PROBLEMS (unusual diarrhea, constipation, pain near the anus) TENDERNESS IN MOUTH AND THROAT WITH OR WITHOUT PRESENCE OF ULCERS (sore throat, sores in mouth, or a toothache) UNUSUAL RASH, SWELLING OR PAIN  UNUSUAL VAGINAL DISCHARGE OR ITCHING   Items with * indicate a potential emergency and should be followed up as soon as possible or go to the Emergency Department if any problems should occur.  Please show the CHEMOTHERAPY ALERT CARD or IMMUNOTHERAPY ALERT CARD at check-in to  the Emergency Department and triage nurse.  Should you have questions after your visit or need to cancel or reschedule your appointment, please contact [facility]  [phone] and follow the prompts.  Office hours are 8:00 a.m. to 4:30 p.m. Monday - Friday. Please note that voicemails left after 4:00 p.m. may not be returned until the following business day.  We are closed weekends and major holidays. You have access to a nurse at all times for urgent questions. Please call the main number to the clinic [phone] and follow the prompts.  For any non-urgent questions, you may also contact your provider using MyChart. We now offer e-Visits for anyone 18 and older to request care online for non-urgent symptoms. For details visit [email or web address].   Also download the MyChart app! Go to the app store, search "MyChart", open the app, select [facility], and log in with your MyChart username and password.   

## 2023-04-01 NOTE — Addendum Note (Signed)
Addended by: Jim Like on: 04/01/2023 09:39 AM   Modules accepted: Orders

## 2023-04-01 NOTE — Progress Notes (Signed)
Patient says that he is doing well, no new questions or concerns for Dr. B today.

## 2023-04-02 DIAGNOSIS — J9601 Acute respiratory failure with hypoxia: Secondary | ICD-10-CM | POA: Diagnosis not present

## 2023-04-02 DIAGNOSIS — J441 Chronic obstructive pulmonary disease with (acute) exacerbation: Secondary | ICD-10-CM | POA: Diagnosis not present

## 2023-04-02 LAB — CEA: CEA: 2.3 ng/mL (ref 0.0–4.7)

## 2023-04-07 DIAGNOSIS — T66XXXA Radiation sickness, unspecified, initial encounter: Secondary | ICD-10-CM | POA: Diagnosis not present

## 2023-04-07 DIAGNOSIS — H3589 Other specified retinal disorders: Secondary | ICD-10-CM | POA: Diagnosis not present

## 2023-04-07 LAB — HM DIABETES EYE EXAM

## 2023-04-22 ENCOUNTER — Inpatient Hospital Stay: Payer: Medicare HMO | Admitting: Internal Medicine

## 2023-04-22 ENCOUNTER — Inpatient Hospital Stay: Payer: Medicare HMO

## 2023-04-22 ENCOUNTER — Encounter: Payer: Self-pay | Admitting: Internal Medicine

## 2023-04-22 VITALS — BP 143/85 | HR 53 | Temp 96.9°F | Ht 69.0 in | Wt 123.2 lb

## 2023-04-22 DIAGNOSIS — R5383 Other fatigue: Secondary | ICD-10-CM | POA: Diagnosis not present

## 2023-04-22 DIAGNOSIS — Z8249 Family history of ischemic heart disease and other diseases of the circulatory system: Secondary | ICD-10-CM | POA: Diagnosis not present

## 2023-04-22 DIAGNOSIS — C3411 Malignant neoplasm of upper lobe, right bronchus or lung: Secondary | ICD-10-CM

## 2023-04-22 DIAGNOSIS — Z9981 Dependence on supplemental oxygen: Secondary | ICD-10-CM | POA: Diagnosis not present

## 2023-04-22 DIAGNOSIS — Z7189 Other specified counseling: Secondary | ICD-10-CM

## 2023-04-22 DIAGNOSIS — Z87891 Personal history of nicotine dependence: Secondary | ICD-10-CM | POA: Diagnosis not present

## 2023-04-22 DIAGNOSIS — Z823 Family history of stroke: Secondary | ICD-10-CM | POA: Diagnosis not present

## 2023-04-22 DIAGNOSIS — Z923 Personal history of irradiation: Secondary | ICD-10-CM | POA: Diagnosis not present

## 2023-04-22 DIAGNOSIS — Z79899 Other long term (current) drug therapy: Secondary | ICD-10-CM | POA: Diagnosis not present

## 2023-04-22 DIAGNOSIS — Z5112 Encounter for antineoplastic immunotherapy: Secondary | ICD-10-CM | POA: Diagnosis not present

## 2023-04-22 DIAGNOSIS — Z801 Family history of malignant neoplasm of trachea, bronchus and lung: Secondary | ICD-10-CM | POA: Diagnosis not present

## 2023-04-22 DIAGNOSIS — Z79890 Hormone replacement therapy (postmenopausal): Secondary | ICD-10-CM | POA: Diagnosis not present

## 2023-04-22 DIAGNOSIS — C787 Secondary malignant neoplasm of liver and intrahepatic bile duct: Secondary | ICD-10-CM | POA: Diagnosis not present

## 2023-04-22 DIAGNOSIS — Z79620 Long term (current) use of immunosuppressive biologic: Secondary | ICD-10-CM | POA: Diagnosis not present

## 2023-04-22 DIAGNOSIS — N400 Enlarged prostate without lower urinary tract symptoms: Secondary | ICD-10-CM | POA: Diagnosis not present

## 2023-04-22 DIAGNOSIS — E039 Hypothyroidism, unspecified: Secondary | ICD-10-CM | POA: Diagnosis not present

## 2023-04-22 DIAGNOSIS — J449 Chronic obstructive pulmonary disease, unspecified: Secondary | ICD-10-CM | POA: Diagnosis not present

## 2023-04-22 DIAGNOSIS — M2550 Pain in unspecified joint: Secondary | ICD-10-CM | POA: Diagnosis not present

## 2023-04-22 DIAGNOSIS — C7951 Secondary malignant neoplasm of bone: Secondary | ICD-10-CM | POA: Diagnosis not present

## 2023-04-22 DIAGNOSIS — M549 Dorsalgia, unspecified: Secondary | ICD-10-CM | POA: Diagnosis not present

## 2023-04-22 DIAGNOSIS — R0602 Shortness of breath: Secondary | ICD-10-CM | POA: Diagnosis not present

## 2023-04-22 LAB — CBC WITH DIFFERENTIAL/PLATELET
Abs Immature Granulocytes: 0.02 10*3/uL (ref 0.00–0.07)
Basophils Absolute: 0 10*3/uL (ref 0.0–0.1)
Basophils Relative: 1 %
Eosinophils Absolute: 0.4 10*3/uL (ref 0.0–0.5)
Eosinophils Relative: 7 %
HCT: 40.7 % (ref 39.0–52.0)
Hemoglobin: 12.4 g/dL — ABNORMAL LOW (ref 13.0–17.0)
Immature Granulocytes: 0 %
Lymphocytes Relative: 20 %
Lymphs Abs: 1 10*3/uL (ref 0.7–4.0)
MCH: 29.8 pg (ref 26.0–34.0)
MCHC: 30.5 g/dL (ref 30.0–36.0)
MCV: 97.8 fL (ref 80.0–100.0)
Monocytes Absolute: 0.5 10*3/uL (ref 0.1–1.0)
Monocytes Relative: 10 %
Neutro Abs: 3.2 10*3/uL (ref 1.7–7.7)
Neutrophils Relative %: 62 %
Platelets: 145 10*3/uL — ABNORMAL LOW (ref 150–400)
RBC: 4.16 MIL/uL — ABNORMAL LOW (ref 4.22–5.81)
RDW: 12.1 % (ref 11.5–15.5)
WBC: 5.1 10*3/uL (ref 4.0–10.5)
nRBC: 0 % (ref 0.0–0.2)

## 2023-04-22 LAB — COMPREHENSIVE METABOLIC PANEL
ALT: 13 U/L (ref 0–44)
AST: 21 U/L (ref 15–41)
Albumin: 4.1 g/dL (ref 3.5–5.0)
Alkaline Phosphatase: 52 U/L (ref 38–126)
Anion gap: 6 (ref 5–15)
BUN: 12 mg/dL (ref 8–23)
CO2: 40 mmol/L — ABNORMAL HIGH (ref 22–32)
Calcium: 9.2 mg/dL (ref 8.9–10.3)
Chloride: 90 mmol/L — ABNORMAL LOW (ref 98–111)
Creatinine, Ser: 0.57 mg/dL — ABNORMAL LOW (ref 0.61–1.24)
GFR, Estimated: >60 mL/min (ref >60)
Glucose, Bld: 116 mg/dL — ABNORMAL HIGH (ref 70–99)
Potassium: 4 mmol/L (ref 3.5–5.1)
Sodium: 136 mmol/L (ref 135–145)
Total Bilirubin: 0.5 mg/dL (ref 0.3–1.2)
Total Protein: 7.2 g/dL (ref 6.5–8.1)

## 2023-04-22 LAB — TSH: TSH: 3.733 u[IU]/mL (ref 0.350–4.500)

## 2023-04-22 MED ORDER — HEPARIN SOD (PORK) LOCK FLUSH 100 UNIT/ML IV SOLN
500.0000 [IU] | Freq: Once | INTRAVENOUS | Status: AC | PRN
  Administered 2023-04-22: 500 [IU]
  Filled 2023-04-22: qty 5

## 2023-04-22 MED ORDER — SODIUM CHLORIDE 0.9 % IV SOLN
Freq: Once | INTRAVENOUS | Status: AC
  Filled 2023-04-22: qty 250

## 2023-04-22 MED ORDER — SODIUM CHLORIDE 0.9 % IV SOLN
200.0000 mg | Freq: Once | INTRAVENOUS | Status: AC
  Administered 2023-04-22: 200 mg via INTRAVENOUS
  Filled 2023-04-22: qty 8

## 2023-04-22 NOTE — Progress Notes (Signed)
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   # 2021- MAY 26th-Keytrda x2 cycles; [borderline PS] July 7th-carbo Alimta Keytruda cycle #1  #November 2022-oligometastatic progression-right upper lobe subpleural involvement with; s/p radiation finished December 15.  Temporarily held [place].  #Jan fourth 2023-restarted Keytruda.  # NGS/MOLECULAR TESTS:   # PALLIATIVE CARE EVALUATION:  # PAIN MANAGEMENT:    DIAGNOSIS:   STAGE:         ;  GOALS:  CURRENT/MOST RECENT THERAPY :     Cancer of upper lobe of right lung (HCC)  11/23/2019 Initial Diagnosis   Cancer of upper lobe of right lung (HCC)   12/16/2019 -  Chemotherapy   Patient is on Treatment Plan : LUNG Carboplatin (5) + Pemetrexed (500) + Pembrolizumab (200) D1 q21d Induction x 4 cycles / Maintenance Pemetrexed (500) + Pembrolizumab (200) D1 q21d     12/21/2019 - 02/26/2022 Chemotherapy   Patient is on Treatment Plan : LUNG CARBOplatin / Pemetrexed / Pembrolizumab q21d Induction x 4 cycles / Maintenance Pemetrexed + Pembrolizumab      HISTORY OF PRESENTING ILLNESS: Thin built frail appearing male patient.  No acute distress. Alone.  Nasal cannula oxygen.  Ambulating independently.    Cody [name] 67 y.o.  male patient  with metastatic non-small cell lung cancer; cord compression status post decompressive surgery; currently on Keytruda maintenance-is here for follow-up.   Chronic SOB on 4L of O2 all the time; not any worse. Some fatigue. Pt is able to due all his activities of daily living.  Appetite is almost normal. Denies pain. Denies any dizziness.   Stopped drinking his supplements due to making him sick.    Review of Systems  Constitutional:  Positive for malaise/fatigue. Negative for chills, diaphoresis and fever.  HENT:  Negative for nosebleeds and sore throat.   Eyes:  Negative for double vision.  Respiratory:  Positive for shortness of breath. Negative for cough, hemoptysis, sputum production and wheezing.   Cardiovascular:  Negative for chest pain, palpitations, orthopnea and leg swelling.  Gastrointestinal:  Negative for abdominal pain, blood in stool, constipation, heartburn, melena, nausea and vomiting.  Genitourinary:  Negative for dysuria, frequency and urgency.  Musculoskeletal:  Positive for back pain and joint pain.  Skin: Negative.  Negative for itching and rash.  Neurological:  Negative for dizziness, tingling, focal weakness, weakness and headaches.  Endo/Heme/Allergies:  Does not bruise/bleed easily.  Psychiatric/Behavioral:  Negative for depression. The patient is not nervous/anxious and does not have insomnia.      MEDICAL HISTORY:  Past Medical History:  Diagnosis Date   Allergy    Cancer of upper lobe of right lung (HCC) 10/2019   COPD (chronic obstructive pulmonary disease) (HCC)    Prostate enlargement     SURGICAL HISTORY: Past Surgical History:  Procedure Laterality Date   HERNIA REPAIR Bilateral 1982   HERNIA REPAIR Right  1994   IR IMAGING GUIDED PORT INSERTION  02/24/2020   LAMINECTOMY FOR EXCISION / EVACUATION INTRASPINAL LESION  11/07/2019   T1-T 6 at [facility]     SOCIAL HISTORY: Social History   Socioeconomic History   Marital status: Married    Spouse  name: [name]    Number of children: 4   Years of education: Not on file   Highest education level: Some college, no degree  Occupational History   Occupation: dispatch and delivery   Tobacco Use   Smoking status: Former    Current packs/day: 0.00    Average packs/day: 2.0 packs/day for 30.0 years (60.0 ttl pk-yrs)    Types: Cigarettes    Start date: 07/28/1981    Quit date: 07/29/2011    Years since quitting: 11.7   Smokeless tobacco: Never   Tobacco comments:    pt vapes  Vaping Use   Vaping status: Not on file  Substance and Sexual Activity   Alcohol use: Yes    Alcohol/week: 5.0 standard drinks of alcohol    Types: 5 Standard drinks or equivalent per week    Comment: 1-2/day   Drug use: No   Sexual activity: Not Currently    Partners: Female  Other Topics Concern   Not on file  Social History Narrative   Worked in warehouse/ quit smoking in 2015; beer 1/day; in [place]; with wife.    Social Determinants of Health   Financial Resource Strain: Low Risk  (11/15/2022)   Overall Financial Resource Strain (CARDIA)    Difficulty of Paying Living Expenses: Not very hard  Food Insecurity: No Food Insecurity (02/04/2023)   Hunger Vital Sign    Worried About Running Out of Food in the Last Year: Never true    Ran Out of Food in the Last Year: Never true  Transportation Needs: No Transportation Needs (02/04/2023)   PRAPARE - Administrator, Civil Service (Medical): No    Lack of Transportation (Non-Medical): No  Physical Activity: Unknown (11/15/2022)   Exercise Vital Sign    Days of Exercise per Week: Patient declined    Minutes of Exercise per Session: Not on file  Stress: No Stress Concern Present (11/15/2022)   [employer] of Occupational Health - Occupational Stress Questionnaire    Feeling of Stress : Only a little  Social Connections: Unknown (11/15/2022)   Social Connection and Isolation Panel [NHANES]    Frequency of Communication with Friends and  Family: Three times a week    Frequency of Social Gatherings with Friends and Family: Patient declined    Attends Religious Services: More than 4 times per year    Active Member of [employer] or Organizations: Not on file    Attends Banker Meetings: Not on file    Marital Status: Married  Intimate Partner Violence: Not At Risk (10/21/2019)   Humiliation, Afraid, Rape, and Kick questionnaire    Fear of Current or Ex-Partner: No    Emotionally Abused: No    Physically Abused: No    Sexually Abused: No    FAMILY HISTORY: Family History  Problem Relation Age of Onset   Heart disease Father    CVA Mother    Lung cancer Sister    Lung cancer Brother     ALLERGIES:  has No Known Allergies.  MEDICATIONS:  Current Outpatient Medications  Medication Sig Dispense Refill   acetaminophen (TYLENOL) 500 MG tablet Take 500-1,000 mg by mouth every 6 (six) hours as needed for  mild pain or fever.      albuterol (VENTOLIN HFA) 108 (90 Base) MCG/ACT inhaler Inhale 2 puffs into the lungs every 4 (four) hours as needed for wheezing or shortness of breath. 18 g 0   Calcium Carb-Cholecalciferol (OYSTER SHELL CALCIUM) 500-400 MG-UNIT TABS Take 500 tablets by mouth.     escitalopram (LEXAPRO) 5 MG tablet TAKE 1 TABLET BY MOUTH EVERY MORNING 90 tablet 0   finasteride (PROSCAR) 5 MG tablet Take 1 tablet (5 mg total) by mouth daily. 90 tablet 0   levothyroxine (SYNTHROID) 50 MCG tablet TAKE 1 TABLET BY MOUTH DAILY BEFORE BREAKFAST 90 tablet 2   lidocaine-prilocaine (EMLA) cream Apply 1 application topically as needed. Apply small amount to port site at least 1 hour prior to it being accessed, cover with plastic wrap 30 g 1   Loratadine 10 MG CAPS      Multiple Vitamin (MULTIVITAMIN) tablet Take 1 tablet by mouth daily.     pravastatin (PRAVACHOL) 20 MG tablet Take 1 tablet (20 mg total) by mouth daily. 90 tablet 3   QUEtiapine (SEROQUEL) 25 MG tablet Take 1 tablet (25 mg total) by mouth at bedtime.  90 tablet 1   tamsulosin (FLOMAX) 0.4 MG CAPS capsule Take 1 capsule (0.4 mg total) by mouth daily. 90 capsule 0   No current facility-administered medications for this visit.   Facility-Administered Medications Ordered in Other Visits  Medication Dose Route Frequency Provider Last Rate Last Admin   heparin lock flush 100 UNIT/ML injection            heparin lock flush 100 unit/mL  500 Units Intracatheter Once PRN Earna Coder, MD       pembrolizumab [facility]) 200 mg in sodium chloride 0.9 % 50 mL chemo infusion  200 mg Intravenous Once Louretta Shorten R, MD          .  PHYSICAL EXAMINATION: ECOG PERFORMANCE STATUS: 3 - Symptomatic, >50% confined to bed  Vitals:   04/22/23 0845  BP: (!) 143/85  Pulse: (!) 53  Temp: (!) 96.9 F (36.1 C)  SpO2: 100%       Filed Weights   04/22/23 0845  Weight: 123 lb 3.2 oz (55.9 kg)         Physical Exam HENT:     Head: Normocephalic and atraumatic.     Mouth/Throat:     Pharynx: No oropharyngeal exudate.  Eyes:     Pupils: Pupils are equal, round, and reactive to light.  Cardiovascular:     Rate and Rhythm: Normal rate and regular rhythm.  Pulmonary:     Effort: No respiratory distress.     Breath sounds: No wheezing.     Comments: Decreased air entry bilaterally. Abdominal:     General: Bowel sounds are normal. There is no distension.     Palpations: Abdomen is soft. There is no mass.     Tenderness: There is no abdominal tenderness. There is no guarding or rebound.  Musculoskeletal:        General: No tenderness. Normal range of motion.     Cervical back: Normal range of motion and neck supple.  Skin:    General: Skin is warm.  Neurological:     Mental Status: He is alert and oriented to person, place, and time.  Psychiatric:        Mood and Affect: Affect normal.      LABORATORY DATA:  I have reviewed the data as listed Lab Results  Component  Value Date   WBC 5.1 04/22/2023   HGB 12.4 (L)  04/22/2023   HCT 40.7 04/22/2023   MCV 97.8 04/22/2023   PLT 145 (L) 04/22/2023   Recent Labs    03/11/23 0947 04/01/23 0843 04/22/23 0838  NA 140 137 136  K 4.1 4.1 4.0  CL 90* 89* 90*  CO2 41* 41* 40*  GLUCOSE 105* 109* 116*  BUN 11 14 12   CREATININE 0.54* 0.47* 0.57*  CALCIUM 9.3 8.9 9.2  GFRNONAA >60 >60 >60  PROT 7.4 7.1 7.2  ALBUMIN 3.8 4.0 4.1  AST 17 21 21   ALT 12 14 13   ALKPHOS 55 50 52  BILITOT 0.1* 0.3 0.5    RADIOGRAPHIC STUDIES: I have personally reviewed the radiological images as listed and agreed with the findings in the report. No results found.   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   Oligo-metastatic progression-s/p RT right upper lobe mass [s/p dec 15th, 2022].  Currently on Keytruda maintenance.   # June 26th, 2024- CT CAP-  Unchanged post treatment appearance of the right pulmonary apex with fibrotic scarring and volume loss; Unchanged sclerotic, expansile appearance of the posterior right third through fifth ribs consistent with treated metastases; No evidence of lymphadenopathy or soft tissue metastatic disease in the chest, abdomen, or pelvis.  stable   # proceed with [place] maintainance today. Labs today reviewed;  acceptable for treatment today.    stable   # Bone metastases- [[facility], Dr.Parks]. Continue multivitamin with calcium plus vitamin D 3 times a day.  S/p extractions. jan 19th, 2024; s/p dental clearance [April 2024]. vit D 25 OH- march 2024- 70. Currently on Zometa 3 mg q 3 M. stable   # Hypothyroidism- [ Mid sep 2022]  Normal T3-T4.  Currently on 50 mcg a day; JUNE 2024- WNL- stable   # COPD-. On 2-3 lit O2; Trelegy.Clinically- stable   # Emphysema prostatomegaly- s/p  Dr.Brandon re: prostate enlargement- on Flomax/Proscar [Dr.Bradon]- PSA- 10.3-stable   HOLD ZOMETA- last 03/11/2023 q 50M;    #  DISPOSITION:  # Keytruda today- # follow up in 3 weeks-MD..  port/labs- cbc/cmp;CEA;Thyroid profile; Keytruda;---Dr.B    All questions were answered. The patient knows to call the clinic with any problems, questions or concerns.    Earna Coder, MD 04/22/2023 10:55 AM

## 2023-04-22 NOTE — Progress Notes (Signed)
SOB on 4L of O2 all the time.  Do you think he should get the RSV injection?  Stopped drinking his supplements due to making him sick.

## 2023-04-22 NOTE — Patient Instructions (Signed)
[facility]  Discharge Instructions: Thank you for choosing [facility] to provide your oncology and hematology care.  If you have a lab appointment with the Cancer Center, please go directly to the Cancer Center and check in at the registration area.  Wear comfortable clothing and clothing appropriate for easy access to any Portacath or PICC line.   We strive to give you quality time with your provider. You may need to reschedule your appointment if you arrive late (15 or more minutes).  Arriving late affects you and other patients whose appointments are after yours.  Also, if you miss three or more appointments without notifying the office, you may be dismissed from the clinic at the provider's discretion.      For prescription refill requests, have your pharmacy contact our office and allow 72 hours for refills to be completed.    Today you received the following chemotherapy and/or immunotherapy agents KEYTRUDA      To help prevent nausea and vomiting after your treatment, we encourage you to take your nausea medication as directed.  BELOW ARE SYMPTOMS THAT SHOULD BE REPORTED IMMEDIATELY: *FEVER GREATER THAN 100.4 F (38 C) OR HIGHER *CHILLS OR SWEATING *NAUSEA AND VOMITING THAT IS NOT CONTROLLED WITH YOUR NAUSEA MEDICATION *UNUSUAL SHORTNESS OF BREATH *UNUSUAL BRUISING OR BLEEDING *URINARY PROBLEMS (pain or burning when urinating, or frequent urination) *BOWEL PROBLEMS (unusual diarrhea, constipation, pain near the anus) TENDERNESS IN MOUTH AND THROAT WITH OR WITHOUT PRESENCE OF ULCERS (sore throat, sores in mouth, or a toothache) UNUSUAL RASH, SWELLING OR PAIN  UNUSUAL VAGINAL DISCHARGE OR ITCHING   Items with * indicate a potential emergency and should be followed up as soon as possible or go to the Emergency Department if any problems should occur.  Please show the CHEMOTHERAPY ALERT CARD or IMMUNOTHERAPY ALERT CARD at check-in to  the Emergency Department and triage nurse.  Should you have questions after your visit or need to cancel or reschedule your appointment, please contact [facility]  [phone] and follow the prompts.  Office hours are 8:00 a.m. to 4:30 p.m. Monday - Friday. Please note that voicemails left after 4:00 p.m. may not be returned until the following business day.  We are closed weekends and major holidays. You have access to a nurse at all times for urgent questions. Please call the main number to the clinic [phone] and follow the prompts.  For any non-urgent questions, you may also contact your provider using MyChart. We now offer e-Visits for anyone 90 and older to request care online for non-urgent symptoms. For details visit [email or web address].   Also download the MyChart app! Go to the app store, search "MyChart", open the app, select , and log in with your MyChart username and password.   Pembrolizumab Injection What is this medication? PEMBROLIZUMAB (PEM broe LIZ ue mab) treats some types of cancer. It works by helping your immune system slow or stop the spread of cancer cells. It is a monoclonal antibody. This medicine may be used for other purposes; ask your health care provider or pharmacist if you have questions. COMMON BRAND NAME(S): Keytruda What should I tell my care team before I take this medication? They need to know if you have any of these conditions: Allogeneic stem cell transplant (uses someone else's stem cells) Autoimmune diseases, such as Crohn disease, ulcerative colitis, lupus History of chest radiation Nervous system problems, such as Guillain-Barre syndrome, myasthenia  gravis Organ transplant An unusual or allergic reaction to pembrolizumab, other medications, foods, dyes, or preservatives Pregnant or trying to get pregnant Breast-feeding How should I use this medication? This medication is injected into a vein.  It is given by your care team in a hospital or clinic setting. A special MedGuide will be given to you before each treatment. Be sure to read this information carefully each time. Talk to your care team about the use of this medication in children. While it may be prescribed for children as young as 6 months for selected conditions, precautions do apply. Overdosage: If you think you have taken too much of this medicine contact a poison control center or emergency room at once. NOTE: This medicine is only for you. Do not share this medicine with others. What if I miss a dose? Keep appointments for follow-up doses. It is important not to miss your dose. Call your care team if you are unable to keep an appointment. What may interact with this medication? Interactions have not been studied. This list may not describe all possible interactions. Give your health care provider a list of all the medicines, herbs, non-prescription drugs, or dietary supplements you use. Also tell them if you smoke, drink alcohol, or use illegal drugs. Some items may interact with your medicine. What should I watch for while using this medication? Your condition will be monitored carefully while you are receiving this medication. You may need blood work while taking this medication. This medication may cause serious skin reactions. They can happen weeks to months after starting the medication. Contact your care team right away if you notice fevers or flu-like symptoms with a rash. The rash may be red or purple and then turn into blisters or peeling of the skin. You may also notice a red rash with swelling of the face, lips, or lymph nodes in your neck or under your arms. Tell your care team right away if you have any change in your eyesight. Talk to your care team if you may be pregnant. Serious birth defects can occur if you take this medication during pregnancy and for 4 months after the last dose. You will need a negative  pregnancy test before starting this medication. Contraception is recommended while taking this medication and for 4 months after the last dose. Your care team can help you find the option that works for you. Do not breastfeed while taking this medication and for 4 months after the last dose. What side effects may I notice from receiving this medication? Side effects that you should report to your care team as soon as possible: Allergic reactions--skin rash, itching, hives, swelling of the face, lips, tongue, or throat Dry cough, shortness of breath or trouble breathing Eye pain, redness, irritation, or discharge with blurry or decreased vision Heart muscle inflammation--unusual weakness or fatigue, shortness of breath, chest pain, fast or irregular heartbeat, dizziness, swelling of the ankles, feet, or hands Hormone gland problems--headache, sensitivity to light, unusual weakness or fatigue, dizziness, fast or irregular heartbeat, increased sensitivity to cold or heat, excessive sweating, constipation, hair loss, increased thirst or amount of urine, tremors or shaking, irritability Infusion reactions--chest pain, shortness of breath or trouble breathing, feeling faint or lightheaded Kidney injury (glomerulonephritis)--decrease in the amount of urine, red or dark brown urine, foamy or bubbly urine, swelling of the ankles, hands, or feet Liver injury--right upper belly pain, loss of appetite, nausea, light-colored stool, dark yellow or brown urine, yellowing skin or eyes, unusual  weakness or fatigue Pain, tingling, or numbness in the hands or feet, muscle weakness, change in vision, confusion or trouble speaking, loss of balance or coordination, trouble walking, seizures Rash, fever, and swollen lymph nodes Redness, blistering, peeling, or loosening of the skin, including inside the mouth Sudden or severe stomach pain, bloody diarrhea, fever, nausea, vomiting Side effects that usually do not require  medical attention (report to your care team if they continue or are bothersome): Bone, joint, or muscle pain Diarrhea Fatigue Loss of appetite Nausea Skin rash This list may not describe all possible side effects. Call your doctor for medical advice about side effects. You may report side effects to FDA at 1-800-FDA-1088. Where should I keep my medication? This medication is given in a hospital or clinic. It will not be stored at home. NOTE: This sheet is a summary. It may not cover all possible information. If you have questions about this medicine, talk to your doctor, pharmacist, or health care provider.  2024 Elsevier/Gold Standard (2021-11-26 00:00:00)

## 2023-04-22 NOTE — Assessment & Plan Note (Addendum)
# [  april2021] Stage IV metastatic non-small cell lung cancer favor adeno; mets to bone; liver.   Oligo-metastatic progression-s/p RT right upper lobe mass [s/p dec 15th, 2022].  Currently on Keytruda maintenance.   # June 26th, 2024- CT CAP-  Unchanged post treatment appearance of the right pulmonary apex with fibrotic scarring and volume loss; Unchanged sclerotic, expansile appearance of the posterior right third through fifth ribs consistent with treated metastases; No evidence of lymphadenopathy or soft tissue metastatic disease in the chest, abdomen, or pelvis.  stable   # proceed with [place] maintainance today. Labs today reviewed;  acceptable for treatment today.    stable   # Bone metastases- [[facility], Dr.Parks]. Continue multivitamin with calcium plus vitamin D 3 times a day.  S/p extractions. jan 19th, 2024; s/p dental clearance [April 2024]. vit D 25 OH- march 2024- 70. Currently on Zometa 3 mg q 3 M. stable   # Hypothyroidism- [ Mid sep 2022]  Normal T3-T4.  Currently on 50 mcg a day; JUNE 2024- WNL- stable   # COPD-. On 2-3 lit O2; Trelegy.Clinically- stable   # Emphysema prostatomegaly- s/p  Dr.Brandon re: prostate enlargement- on Flomax/Proscar [Dr.Bradon]- PSA- 10.3-stable   HOLD ZOMETA- last 03/11/2023 q 52M;    # DISPOSITION:  # Keytruda today- # follow up in 3 weeks-MD..  port/labs- cbc/cmp;CEA;Thyroid profile; Keytruda;---Dr.B

## 2023-04-23 LAB — CEA: CEA: 2.2 ng/mL (ref 0.0–4.7)

## 2023-05-02 DIAGNOSIS — J441 Chronic obstructive pulmonary disease with (acute) exacerbation: Secondary | ICD-10-CM | POA: Diagnosis not present

## 2023-05-02 DIAGNOSIS — J9601 Acute respiratory failure with hypoxia: Secondary | ICD-10-CM | POA: Diagnosis not present

## 2023-05-13 ENCOUNTER — Inpatient Hospital Stay: Payer: Medicare HMO | Admitting: Internal Medicine

## 2023-05-13 ENCOUNTER — Inpatient Hospital Stay: Payer: Medicare HMO | Attending: Internal Medicine

## 2023-05-13 ENCOUNTER — Inpatient Hospital Stay: Payer: Medicare HMO

## 2023-05-13 ENCOUNTER — Encounter: Payer: Self-pay | Admitting: Internal Medicine

## 2023-05-13 VITALS — BP 111/74 | HR 79

## 2023-05-13 VITALS — BP 120/72 | HR 100 | Temp 96.6°F | Ht 69.0 in | Wt 122.2 lb

## 2023-05-13 DIAGNOSIS — C7951 Secondary malignant neoplasm of bone: Secondary | ICD-10-CM | POA: Diagnosis not present

## 2023-05-13 DIAGNOSIS — Z5112 Encounter for antineoplastic immunotherapy: Secondary | ICD-10-CM | POA: Insufficient documentation

## 2023-05-13 DIAGNOSIS — I700 Atherosclerosis of aorta: Secondary | ICD-10-CM | POA: Diagnosis not present

## 2023-05-13 DIAGNOSIS — C3411 Malignant neoplasm of upper lobe, right bronchus or lung: Secondary | ICD-10-CM

## 2023-05-13 DIAGNOSIS — R0602 Shortness of breath: Secondary | ICD-10-CM | POA: Insufficient documentation

## 2023-05-13 DIAGNOSIS — M2550 Pain in unspecified joint: Secondary | ICD-10-CM | POA: Diagnosis not present

## 2023-05-13 DIAGNOSIS — J439 Emphysema, unspecified: Secondary | ICD-10-CM | POA: Insufficient documentation

## 2023-05-13 DIAGNOSIS — Z23 Encounter for immunization: Secondary | ICD-10-CM | POA: Insufficient documentation

## 2023-05-13 DIAGNOSIS — Z87891 Personal history of nicotine dependence: Secondary | ICD-10-CM | POA: Insufficient documentation

## 2023-05-13 DIAGNOSIS — Z801 Family history of malignant neoplasm of trachea, bronchus and lung: Secondary | ICD-10-CM | POA: Diagnosis not present

## 2023-05-13 DIAGNOSIS — Z7189 Other specified counseling: Secondary | ICD-10-CM | POA: Diagnosis not present

## 2023-05-13 DIAGNOSIS — Z923 Personal history of irradiation: Secondary | ICD-10-CM | POA: Insufficient documentation

## 2023-05-13 DIAGNOSIS — M549 Dorsalgia, unspecified: Secondary | ICD-10-CM | POA: Diagnosis not present

## 2023-05-13 DIAGNOSIS — N400 Enlarged prostate without lower urinary tract symptoms: Secondary | ICD-10-CM | POA: Diagnosis not present

## 2023-05-13 DIAGNOSIS — Z823 Family history of stroke: Secondary | ICD-10-CM | POA: Diagnosis not present

## 2023-05-13 DIAGNOSIS — R54 Age-related physical debility: Secondary | ICD-10-CM | POA: Diagnosis not present

## 2023-05-13 DIAGNOSIS — Z79899 Other long term (current) drug therapy: Secondary | ICD-10-CM | POA: Diagnosis not present

## 2023-05-13 DIAGNOSIS — Z79890 Hormone replacement therapy (postmenopausal): Secondary | ICD-10-CM | POA: Diagnosis not present

## 2023-05-13 DIAGNOSIS — E039 Hypothyroidism, unspecified: Secondary | ICD-10-CM | POA: Diagnosis not present

## 2023-05-13 DIAGNOSIS — R5383 Other fatigue: Secondary | ICD-10-CM | POA: Diagnosis not present

## 2023-05-13 DIAGNOSIS — Z8249 Family history of ischemic heart disease and other diseases of the circulatory system: Secondary | ICD-10-CM | POA: Insufficient documentation

## 2023-05-13 LAB — CBC WITH DIFFERENTIAL/PLATELET
Abs Immature Granulocytes: 0.01 10*3/uL (ref 0.00–0.07)
Basophils Absolute: 0 10*3/uL (ref 0.0–0.1)
Basophils Relative: 1 %
Eosinophils Absolute: 0.2 10*3/uL (ref 0.0–0.5)
Eosinophils Relative: 4 %
HCT: 38.7 % — ABNORMAL LOW (ref 39.0–52.0)
Hemoglobin: 11.9 g/dL — ABNORMAL LOW (ref 13.0–17.0)
Immature Granulocytes: 0 %
Lymphocytes Relative: 14 %
Lymphs Abs: 0.7 10*3/uL (ref 0.7–4.0)
MCH: 30.2 pg (ref 26.0–34.0)
MCHC: 30.7 g/dL (ref 30.0–36.0)
MCV: 98.2 fL (ref 80.0–100.0)
Monocytes Absolute: 0.4 10*3/uL (ref 0.1–1.0)
Monocytes Relative: 8 %
Neutro Abs: 3.7 10*3/uL (ref 1.7–7.7)
Neutrophils Relative %: 73 %
Platelets: 145 10*3/uL — ABNORMAL LOW (ref 150–400)
RBC: 3.94 MIL/uL — ABNORMAL LOW (ref 4.22–5.81)
RDW: 12 % (ref 11.5–15.5)
WBC: 5.1 10*3/uL (ref 4.0–10.5)
nRBC: 0 % (ref 0.0–0.2)

## 2023-05-13 LAB — COMPREHENSIVE METABOLIC PANEL WITH GFR
ALT: 13 U/L (ref 0–44)
AST: 18 U/L (ref 15–41)
Albumin: 3.9 g/dL (ref 3.5–5.0)
Alkaline Phosphatase: 49 U/L (ref 38–126)
Anion gap: 8 (ref 5–15)
BUN: 10 mg/dL (ref 8–23)
CO2: 41 mmol/L — ABNORMAL HIGH (ref 22–32)
Calcium: 8.7 mg/dL — ABNORMAL LOW (ref 8.9–10.3)
Chloride: 89 mmol/L — ABNORMAL LOW (ref 98–111)
Creatinine, Ser: 0.52 mg/dL — ABNORMAL LOW (ref 0.61–1.24)
GFR, Estimated: >60 mL/min
Glucose, Bld: 110 mg/dL — ABNORMAL HIGH (ref 70–99)
Potassium: 3.9 mmol/L (ref 3.5–5.1)
Sodium: 138 mmol/L (ref 135–145)
Total Bilirubin: 0.5 mg/dL (ref 0.3–1.2)
Total Protein: 6.9 g/dL (ref 6.5–8.1)

## 2023-05-13 MED ORDER — SODIUM CHLORIDE 0.9 % IV SOLN
Freq: Once | INTRAVENOUS | Status: AC
  Filled 2023-05-13: qty 250

## 2023-05-13 MED ORDER — INFLUENZA VAC A&B SURF ANT ADJ 0.5 ML IM SUSY
0.5000 mL | PREFILLED_SYRINGE | Freq: Once | INTRAMUSCULAR | Status: AC
  Administered 2023-05-13: 0.5 mL via INTRAMUSCULAR
  Filled 2023-05-13: qty 0.5

## 2023-05-13 MED ORDER — HEPARIN SOD (PORK) LOCK FLUSH 100 UNIT/ML IV SOLN
500.0000 [IU] | Freq: Once | INTRAVENOUS | Status: AC | PRN
  Administered 2023-05-13: 500 [IU]
  Filled 2023-05-13: qty 5

## 2023-05-13 MED ORDER — SODIUM CHLORIDE 0.9 % IV SOLN
200.0000 mg | Freq: Once | INTRAVENOUS | Status: AC
  Administered 2023-05-13: 200 mg via INTRAVENOUS
  Filled 2023-05-13: qty 200

## 2023-05-13 NOTE — Patient Instructions (Signed)

## 2023-05-13 NOTE — Progress Notes (Signed)
Would like flu shot today if possible.

## 2023-05-13 NOTE — Progress Notes (Signed)
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   # 2021- MAY 26th-Keytrda x2 cycles; [borderline PS] July 7th-carbo Alimta Keytruda cycle #1  #November 2022-oligometastatic progression-right upper lobe subpleural involvement with; s/p radiation finished December 15.  Temporarily held [place].  #Jan fourth 2023-restarted Keytruda.  # NGS/MOLECULAR TESTS:   # PALLIATIVE CARE EVALUATION:  # PAIN MANAGEMENT:    DIAGNOSIS:   STAGE:         ;  GOALS:  CURRENT/MOST RECENT THERAPY :     Cancer of upper lobe of right lung (HCC)  11/23/2019 Initial Diagnosis   Cancer of upper lobe of right lung (HCC)   12/16/2019 -  Chemotherapy   Patient is on Treatment Plan : LUNG Carboplatin (5) + Pemetrexed (500) + Pembrolizumab (200) D1 q21d Induction x 4 cycles / Maintenance Pemetrexed (500) + Pembrolizumab (200) D1 q21d     12/21/2019 - 02/26/2022 Chemotherapy   Patient is on Treatment Plan : LUNG CARBOplatin / Pemetrexed / Pembrolizumab q21d Induction x 4 cycles / Maintenance Pemetrexed + Pembrolizumab      HISTORY OF PRESENTING ILLNESS: Thin built frail appearing male patient.  No acute distress. Alone.  Nasal cannula oxygen.  Ambulating independently.    Cody [name] 67 y.o.  male patient  with metastatic non-small cell lung cancer; cord compression status post decompressive surgery; currently on Keytruda maintenance-is here for follow-up.   Chronic SOB on 4L of O2 all the time; not any worse. Some fatigue. Pt is able to due all his activities of daily living.    Appetite is almost normal. Denies pain. Denies any dizziness.   Review of Systems  Constitutional:  Positive for malaise/fatigue. Negative for chills, diaphoresis and fever.  HENT:  Negative for nosebleeds and sore throat.   Eyes:  Negative for double vision.  Respiratory:  Positive for shortness of breath. Negative for cough, hemoptysis, sputum production and wheezing.   Cardiovascular:  Negative for chest pain, palpitations, orthopnea and leg swelling.  Gastrointestinal:  Negative for abdominal pain, blood in stool, constipation, heartburn, melena, nausea and vomiting.  Genitourinary:  Negative for dysuria, frequency and urgency.  Musculoskeletal:  Positive for back pain and joint pain.  Skin: Negative.  Negative for itching and rash.  Neurological:  Negative for dizziness, tingling, focal weakness, weakness and headaches.  Endo/Heme/Allergies:  Does not bruise/bleed easily.  Psychiatric/Behavioral:  Negative for depression. The patient is not nervous/anxious and does not have insomnia.      MEDICAL HISTORY:  Past Medical History:  Diagnosis Date   Allergy    Cancer of upper lobe of right lung (HCC) 10/2019   COPD (chronic obstructive pulmonary disease) (HCC)    Prostate enlargement     SURGICAL HISTORY: Past Surgical History:  Procedure Laterality Date   HERNIA REPAIR Bilateral 1982   HERNIA REPAIR Right 1994   IR IMAGING GUIDED PORT INSERTION  02/24/2020  LAMINECTOMY FOR EXCISION / EVACUATION INTRASPINAL LESION  11/07/2019   T1-T 6 at [facility]     SOCIAL HISTORY: Social History   Socioeconomic History   Marital status: Married    Spouse name: [name]    Number of children: 4   Years of education:  Not on file   Highest education level: Some college, no degree  Occupational History   Occupation: dispatch and delivery   Tobacco Use   Smoking status: Former    Current packs/day: 0.00    Average packs/day: 2.0 packs/day for 30.0 years (60.0 ttl pk-yrs)    Types: Cigarettes    Start date: 07/28/1981    Quit date: 07/29/2011    Years since quitting: 11.7   Smokeless tobacco: Never   Tobacco comments:    pt vapes  Vaping Use   Vaping status: Not on file  Substance and Sexual Activity   Alcohol use: Yes    Alcohol/week: 5.0 standard drinks of alcohol    Types: 5 Standard drinks or equivalent per week    Comment: 1-2/day   Drug use: No   Sexual activity: Not Currently    Partners: Female  Other Topics Concern   Not on file  Social History Narrative   Worked in warehouse/ quit smoking in 2015; beer 1/day; in [place]; with wife.    Social Determinants of Health   Financial Resource Strain: Low Risk  (11/15/2022)   Overall Financial Resource Strain (CARDIA)    Difficulty of Paying Living Expenses: Not very hard  Food Insecurity: No Food Insecurity (02/04/2023)   Hunger Vital Sign    Worried About Running Out of Food in the Last Year: Never true    Ran Out of Food in the Last Year: Never true  Transportation Needs: No Transportation Needs (02/04/2023)   PRAPARE - Administrator, Civil Service (Medical): No    Lack of Transportation (Non-Medical): No  Physical Activity: Unknown (11/15/2022)   Exercise Vital Sign    Days of Exercise per Week: Patient declined    Minutes of Exercise per Session: Not on file  Stress: No Stress Concern Present (11/15/2022)   [employer] of Occupational Health - Occupational Stress Questionnaire    Feeling of Stress : Only a little  Social Connections: Unknown (11/15/2022)   Social Connection and Isolation Panel [NHANES]    Frequency of Communication with Friends and Family: Three times a week    Frequency of Social Gatherings with  Friends and Family: Patient declined    Attends Religious Services: More than 4 times per year    Active Member of [employer] or Organizations: Not on file    Attends Banker Meetings: Not on file    Marital Status: Married  Intimate Partner Violence: Not At Risk (10/21/2019)   Humiliation, Afraid, Rape, and Kick questionnaire    Fear of Current or Ex-Partner: No    Emotionally Abused: No    Physically Abused: No    Sexually Abused: No    FAMILY HISTORY: Family History  Problem Relation Age of Onset   Heart disease Father    CVA Mother    Lung cancer Sister    Lung cancer Brother     ALLERGIES:  has No Known Allergies.  MEDICATIONS:  Current Outpatient Medications  Medication Sig Dispense Refill   acetaminophen (TYLENOL) 500 MG tablet Take 500-1,000 mg by mouth every 6 (six) hours as needed for mild pain or fever.      albuterol (VENTOLIN HFA)  108 (90 Base) MCG/ACT inhaler Inhale 2 puffs into the lungs every 4 (four) hours as needed for wheezing or shortness of breath. 18 g 0   Calcium Carb-Cholecalciferol (OYSTER SHELL CALCIUM) 500-400 MG-UNIT TABS Take 500 tablets by mouth.     escitalopram (LEXAPRO) 5 MG tablet TAKE 1 TABLET BY MOUTH EVERY MORNING 90 tablet 0   finasteride (PROSCAR) 5 MG tablet Take 1 tablet (5 mg total) by mouth daily. 90 tablet 0   levothyroxine (SYNTHROID) 50 MCG tablet TAKE 1 TABLET BY MOUTH DAILY BEFORE BREAKFAST 90 tablet 2   lidocaine-prilocaine (EMLA) cream Apply 1 application topically as needed. Apply small amount to port site at least 1 hour prior to it being accessed, cover with plastic wrap 30 g 1   Loratadine 10 MG CAPS      Multiple Vitamin (MULTIVITAMIN) tablet Take 1 tablet by mouth daily.     pravastatin (PRAVACHOL) 20 MG tablet Take 1 tablet (20 mg total) by mouth daily. 90 tablet 3   QUEtiapine (SEROQUEL) 25 MG tablet Take 1 tablet (25 mg total) by mouth at bedtime. 90 tablet 1   tamsulosin (FLOMAX) 0.4 MG CAPS capsule Take 1  capsule (0.4 mg total) by mouth daily. 90 capsule 0   No current facility-administered medications for this visit.   Facility-Administered Medications Ordered in Other Visits  Medication Dose Route Frequency Provider Last Rate Last Admin   heparin lock flush 100 UNIT/ML injection              PHYSICAL EXAMINATION: ECOG PERFORMANCE STATUS: 3 - Symptomatic, >50% confined to bed  Vitals:   05/13/23 1432  BP: 120/72  Pulse: 100  Temp: (!) 96.6 F (35.9 C)  SpO2: 94%    Filed Weights   05/13/23 1432  Weight: 122 lb 3.2 oz (55.4 kg)         Physical Exam HENT:     Head: Normocephalic and atraumatic.     Mouth/Throat:     Pharynx: No oropharyngeal exudate.  Eyes:     Pupils: Pupils are equal, round, and reactive to light.  Cardiovascular:     Rate and Rhythm: Normal rate and regular rhythm.  Pulmonary:     Effort: No respiratory distress.     Breath sounds: No wheezing.     Comments: Decreased air entry bilaterally. Abdominal:     General: Bowel sounds are normal. There is no distension.     Palpations: Abdomen is soft. There is no mass.     Tenderness: There is no abdominal tenderness. There is no guarding or rebound.  Musculoskeletal:        General: No tenderness. Normal range of motion.     Cervical back: Normal range of motion and neck supple.  Skin:    General: Skin is warm.  Neurological:     Mental Status: He is alert and oriented to person, place, and time.  Psychiatric:        Mood and Affect: Affect normal.      LABORATORY DATA:  I have reviewed the data as listed Lab Results  Component Value Date   WBC 5.1 05/13/2023   HGB 11.9 (L) 05/13/2023   HCT 38.7 (L) 05/13/2023   MCV 98.2 05/13/2023   PLT 145 (L) 05/13/2023   Recent Labs    03/11/23 0947 04/01/23 0843 04/22/23 0838  NA 140 137 136  K 4.1 4.1 4.0  CL 90* 89* 90*  CO2 41* 41* 40*  GLUCOSE 105* 109* 116*  BUN 11 14 12   CREATININE 0.54* 0.47* 0.57*  CALCIUM 9.3 8.9 9.2   GFRNONAA >60 >60 >60  PROT 7.4 7.1 7.2  ALBUMIN 3.8 4.0 4.1  AST 17 21 21   ALT 12 14 13   ALKPHOS 55 50 52  BILITOT 0.1* 0.3 0.5    RADIOGRAPHIC STUDIES: I have personally reviewed the radiological images as listed and agreed with the findings in the report. No results found.   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   Oligo-metastatic progression-s/p RT right upper lobe mass [s/p dec 15th, 2022].  Currently on Keytruda maintenance.   # June 26th, 2024- CT CAP-  Unchanged post treatment appearance of the right pulmonary apex with fibrotic scarring and volume loss; Unchanged sclerotic, expansile appearance of the posterior right third through fifth ribs consistent with treated metastases; No evidence of lymphadenopathy or soft tissue metastatic disease in the chest, abdomen, or pelvis. Stable; will order CT CAP today.   # proceed with [place] maintainance today. Labs today reviewed;  acceptable for treatment today.   stable   # Bone metastases- [[facility], Dr.Parks]. Continue multivitamin with calcium plus vitamin D 3 times a day.  S/p extractions. jan 19th, 2024; s/p dental clearance [April 2024]. vit D 25 OH- march 2024- 70. Currently on Zometa 3 mg q 3 M. stable   # Hypothyroidism- [ Mid sep 2022]  Normal T3-T4.  Currently on 50 mcg a day; JUNE 2024- WNL- stable   # COPD-. On 2-3 lit O2; Trelegy.Clinically- stable   # Emphysema prostatomegaly- s/p  Dr.Brandon re: prostate enlargement- on Flomax/Proscar [Dr.Bradon]- PSA- 10.3-stable   HOLD ZOMETA- last 03/11/2023 q 4M;    # DISPOSITION: # Flu shot  # Keytruda today- # follow up in 3 weeks-X-MD= port/labs- cbc/cmp;iron studies;ferritin;CEA;Thyroid profile; Keytruda;CT CAP # follow up in 6 weeks-MD-  port/labs- cbc/cmp;CEA- Keytruda; Zometa---Dr.B    All questions were answered. The patient knows to  call the clinic with any problems, questions or concerns.    Earna Coder, MD 05/13/2023 3:01 PM

## 2023-05-13 NOTE — Assessment & Plan Note (Addendum)
# [  april2021] Stage IV metastatic non-small cell lung cancer favor adeno; mets to bone; liver.   Oligo-metastatic progression-s/p RT right upper lobe mass [s/p dec 15th, 2022].  Currently on Keytruda maintenance.   # June 26th, 2024- CT CAP-  Unchanged post treatment appearance of the right pulmonary apex with fibrotic scarring and volume loss; Unchanged sclerotic, expansile appearance of the posterior right third through fifth ribs consistent with treated metastases; No evidence of lymphadenopathy or soft tissue metastatic disease in the chest, abdomen, or pelvis. Stable; will order CT CAP today.   # proceed with [place] maintainance today. Labs today reviewed;  acceptable for treatment today.   stable   # Bone metastases- [[facility], Dr.Parks]. Continue multivitamin with calcium plus vitamin D 3 times a day.  S/p extractions. jan 19th, 2024; s/p dental clearance [April 2024]. vit D 25 OH- march 2024- 70. Currently on Zometa 3 mg q 3 M. stable   # Hypothyroidism- [ Mid sep 2022]  Normal T3-T4.  Currently on 50 mcg a day; JUNE 2024- WNL- stable   # COPD-. On 2-3 lit O2; Trelegy.Clinically- stable   # Emphysema prostatomegaly- s/p  Dr.Brandon re: prostate enlargement- on Flomax/Proscar [Dr.Bradon]- PSA- 10.3-stable   HOLD ZOMETA- last 03/11/2023 q 75M;    # DISPOSITION: # Flu shot  # Keytruda today- # follow up in 3 weeks-X-MD= port/labs- cbc/cmp;iron studies;ferritin;CEA;Thyroid profile; Keytruda;CT CAP # follow up in 6 weeks-MD-  port/labs- cbc/cmp;CEA- Keytruda; Zometa---Dr.B

## 2023-05-14 ENCOUNTER — Telehealth: Payer: Self-pay | Admitting: *Deleted

## 2023-05-14 LAB — LIPID PANEL
Chol/HDL Ratio: 1.9 {ratio} (ref 0.0–5.0)
Cholesterol, Total: 229 mg/dL — ABNORMAL HIGH (ref 100–199)
HDL: 121 mg/dL (ref >39)
LDL Chol Calc (NIH): 97 mg/dL (ref 0–99)
Triglycerides: 66 mg/dL (ref 0–149)
VLDL Cholesterol Cal: 11 mg/dL (ref 5–40)

## 2023-05-14 LAB — CEA: CEA: 2 ng/mL (ref 0.0–4.7)

## 2023-05-14 NOTE — Telephone Encounter (Signed)
Patient voiced concern to me regarding his oxygen supplies. He is currently on continuous oxygen at 4 L/min. He gets e cylinders to go to MD appts etc... the company he uses has just started a new policy that equipment deliveries only occur every 3 months. I reached out to his supplier, [employer] out of [place]. They requested an order for a POC- portable oxygen concentrator which remedies the need for all those portable e cylinder tanks. I sent a prescription via fax [phone] to them.

## 2023-05-15 LAB — THYROID PANEL WITH TSH
Free Thyroxine Index: 2.2 (ref 1.2–4.9)
T3 Uptake Ratio: 25 % (ref 24–39)
T4, Total: 8.8 ug/dL (ref 4.5–12.0)
TSH: 2.73 u[IU]/mL (ref 0.450–4.500)

## 2023-05-20 NOTE — Progress Notes (Unsigned)
Name: Cody [name]   MRN: [id number]    DOB: 1956/03/13   Date:05/21/2023       Progress Note  Subjective  Chief Complaint  Follow Up  HPI  Depression.Anxiety: he is doing well on Seroquel for sleep takes about 30 minutes, he is also taking Lexapro and doing well. He needs refills.    Lung Cancer/Emphysema/hypoxia/on oxygen: he finished radiation therapy, going finished chemo and now on  Immunotherapy Keytruda that was resumed 07/2021 . He is on 3- 4 liters of oxygen continuously .  He is no longer using a back brace - used for bone metastasis on his spine , but now able to push his own oxygen tank . He stopped taking Trelegy since it does not work for him    History of spinal cord compression: due to metastatic lung disease to spine, s/p laminectomy and posterior fusion T1-6 and is stable he still has numbness on her legs but no weakness   Malnutrition: weight is stable, he states his appetite waxes and wanes, he was taking Ensure but developed diarrhea, advised to try a different type   Atherosclerosis: he is taking pravastatin and LDL is at goal    BPH with elevated PSA: seeing urologist and taking flomax    Senile purpura: both arms, he states stable.    Patient Active Problem List   Diagnosis Date Noted   Moderate protein-calorie malnutrition (HCC) 01/14/2021   Atherosclerosis of aorta (HCC) 01/14/2021   Senile purpura (HCC) 04/10/2020   S/P spinal fusion 12/23/2019   Goals of care, counseling/discussion 12/11/2019   Hypoxia 11/30/2019   Non-small cell cancer of upper lobe of lung (HCC) 11/29/2019   COPD with acute exacerbation (HCC) 11/29/2019   Cancer of upper lobe of right lung (HCC) 11/23/2019   Metastatic cancer to spine (HCC) 11/01/2019   BPH with elevated PSA 07/16/2015   COPD bronchitis 07/16/2015   Seasonal and perennial allergic rhinitis 04/24/2010    Past Surgical History:  Procedure Laterality Date   HERNIA REPAIR Bilateral 1982   HERNIA REPAIR Right  1994   IR IMAGING GUIDED PORT INSERTION  02/24/2020   LAMINECTOMY FOR EXCISION / EVACUATION INTRASPINAL LESION  11/07/2019   T1-T 6 at [facility]     Family History  Problem Relation Age of Onset   Heart disease Father    CVA Mother    Lung cancer Sister    Lung cancer Brother     Social History   Tobacco Use   Smoking status: Former    Current packs/day: 0.00    Average packs/day: 2.0 packs/day for 30.0 years (60.0 ttl pk-yrs)    Types: Cigarettes    Start date: 07/28/1981    Quit date: 07/29/2011    Years since quitting: 11.8   Smokeless tobacco: Never   Tobacco comments:    pt vapes  Substance Use Topics   Alcohol use: Yes    Alcohol/week: 5.0 standard drinks of alcohol    Types: 5 Standard drinks or equivalent per week    Comment: 1-2/day     Current Outpatient Medications:    acetaminophen (TYLENOL) 500 MG tablet, Take 500-1,000 mg by mouth every 6 (six) hours as needed for mild pain or fever. , Disp: , Rfl:    albuterol (VENTOLIN HFA) 108 (90 Base) MCG/ACT inhaler, Inhale 2 puffs into the lungs every 4 (four) hours as needed for wheezing or shortness of breath., Disp: 18 g, Rfl: 0   Calcium Carb-Cholecalciferol (OYSTER SHELL CALCIUM) 500-400  MG-UNIT TABS, Take 500 tablets by mouth., Disp: , Rfl:    escitalopram (LEXAPRO) 5 MG tablet, TAKE 1 TABLET BY MOUTH EVERY MORNING, Disp: 90 tablet, Rfl: 0   finasteride (PROSCAR) 5 MG tablet, Take 1 tablet (5 mg total) by mouth daily., Disp: 90 tablet, Rfl: 0   levothyroxine (SYNTHROID) 50 MCG tablet, TAKE 1 TABLET BY MOUTH DAILY BEFORE BREAKFAST, Disp: 90 tablet, Rfl: 2   Loratadine 10 MG CAPS, , Disp: , Rfl:    Multiple Vitamin (MULTIVITAMIN) tablet, Take 1 tablet by mouth daily., Disp: , Rfl:    pravastatin (PRAVACHOL) 20 MG tablet, Take 1 tablet (20 mg total) by mouth daily., Disp: 90 tablet, Rfl: 3   QUEtiapine (SEROQUEL) 25 MG tablet, Take 1 tablet (25 mg total) by mouth at bedtime., Disp: 90 tablet, Rfl: 1   tamsulosin (FLOMAX)  0.4 MG CAPS capsule, Take 1 capsule (0.4 mg total) by mouth daily., Disp: 90 capsule, Rfl: 0 No current facility-administered medications for this visit.  Facility-Administered Medications Ordered in Other Visits:    heparin lock flush 100 UNIT/ML injection, , , ,   No Known Allergies  I personally reviewed active problem list, medication list, allergies, family history, social history, health maintenance with the patient/caregiver today.   ROS  Ten systems reviewed and is negative except as mentioned in HPI    Objective  Vitals:   05/21/23 0915  BP: 118/68  Pulse: 86  Resp: 16  SpO2: 98%  Weight: 121 lb (54.9 kg)  Height: 5\' 9"  (1.753 m)    Body mass index is 17.87 kg/m.  Physical Exam  Constitutional: Patient appears well-developed and malnourished  No distress.  HEENT: head atraumatic, normocephalic, pupils equal and reactive to light, neck supple Cardiovascular: Normal rate, regular rhythm and normal heart sounds.  No murmur heard. No BLE edema. Pulmonary/Chest: increase in effort , on nasal cannula oxygen  No respiratory distress. Abdominal: Soft.  There is no tenderness. Psychiatric: Patient has a normal mood and affect. behavior is normal. Judgment and thought content normal.   PHQ2/9:    05/21/2023    9:15 AM 11/19/2022    9:24 AM 01/14/2021   10:43 AM 08/27/2020    9:48 AM 07/10/2020    2:07 PM  Depression screen PHQ 2/9  Decreased Interest 0 0 0 0 0  Down, Depressed, Hopeless 1 0 1 0 0  PHQ - 2 Score 1 0 1 0 0  Altered sleeping 0 0   1  Tired, decreased energy 0 0 1  1  Change in appetite 0 0 0  1  Feeling bad or failure about yourself  0 0 1  0  Trouble concentrating 0 0 0  0  Moving slowly or fidgety/restless 0 0 0  0  Suicidal thoughts 0 0 0  0  PHQ-9 Score 1 0   3  Difficult doing work/chores     Not difficult at all    phq 9 is negative   Fall Risk:    05/21/2023    9:15 AM 11/19/2022    9:24 AM 01/14/2021   10:43 AM 08/27/2020    9:48 AM  07/10/2020    2:04 PM  Fall Risk   Falls in the past year? 0 0 0 0 0  Number falls in past yr: 0 0 0 0 0  Injury with Fall? 0 0 0 0 0  Risk for fall due to : No Fall Risks No Fall Risks   Impaired mobility  Follow up Falls prevention discussed Falls prevention discussed         Functional Status Survey: Is the patient deaf or have difficulty hearing?: No Does the patient have difficulty seeing, even when wearing glasses/contacts?: No Does the patient have difficulty concentrating, remembering, or making decisions?: No Does the patient have difficulty walking or climbing stairs?: No Does the patient have difficulty dressing or bathing?: No Does the patient have difficulty doing errands alone such as visiting a doctor's office or shopping?: No    Assessment & Plan  1. Centrilobular emphysema (HCC)  On oxygen  2. Atherosclerosis of aorta (HCC)  On statin therapy   3. Moderate protein-calorie malnutrition (HCC)  Losing weight again, needs to find another protein supplementation  4. Senile purpura (HCC)  Reassurance given  5. Metastatic cancer to spine (HCC)  Pain under control, not on pain medication  6. Non-small cell cancer of upper lobe of lung (HCC)  Compliant with treatment   7. Dysthymia  - escitalopram (LEXAPRO) 5 MG tablet; Take 1 tablet (5 mg total) by mouth every morning.  Dispense: 90 tablet; Refill: 3  8. Anxiety  - escitalopram (LEXAPRO) 5 MG tablet; Take 1 tablet (5 mg total) by mouth every morning.  Dispense: 90 tablet; Refill: 3  9. BPH with elevated PSA   10. Hypoxemia

## 2023-05-21 ENCOUNTER — Encounter: Payer: Self-pay | Admitting: Family Medicine

## 2023-05-21 ENCOUNTER — Ambulatory Visit: Payer: Medicare HMO | Admitting: Family Medicine

## 2023-05-21 VITALS — BP 118/68 | HR 86 | Resp 16 | Ht 69.0 in | Wt 121.0 lb

## 2023-05-21 DIAGNOSIS — R972 Elevated prostate specific antigen [PSA]: Secondary | ICD-10-CM | POA: Diagnosis not present

## 2023-05-21 DIAGNOSIS — I700 Atherosclerosis of aorta: Secondary | ICD-10-CM | POA: Diagnosis not present

## 2023-05-21 DIAGNOSIS — R0902 Hypoxemia: Secondary | ICD-10-CM | POA: Diagnosis not present

## 2023-05-21 DIAGNOSIS — C3410 Malignant neoplasm of upper lobe, unspecified bronchus or lung: Secondary | ICD-10-CM

## 2023-05-21 DIAGNOSIS — F419 Anxiety disorder, unspecified: Secondary | ICD-10-CM | POA: Diagnosis not present

## 2023-05-21 DIAGNOSIS — D692 Other nonthrombocytopenic purpura: Secondary | ICD-10-CM | POA: Diagnosis not present

## 2023-05-21 DIAGNOSIS — N400 Enlarged prostate without lower urinary tract symptoms: Secondary | ICD-10-CM

## 2023-05-21 DIAGNOSIS — J432 Centrilobular emphysema: Secondary | ICD-10-CM | POA: Diagnosis not present

## 2023-05-21 DIAGNOSIS — C7951 Secondary malignant neoplasm of bone: Secondary | ICD-10-CM

## 2023-05-21 DIAGNOSIS — F341 Dysthymic disorder: Secondary | ICD-10-CM

## 2023-05-21 DIAGNOSIS — E440 Moderate protein-calorie malnutrition: Secondary | ICD-10-CM

## 2023-05-21 MED ORDER — ESCITALOPRAM OXALATE 5 MG PO TABS
5.0000 mg | ORAL_TABLET | Freq: Every morning | ORAL | 3 refills | Status: DC
Start: 2023-05-21 — End: 2023-12-20

## 2023-05-25 ENCOUNTER — Ambulatory Visit
Admission: RE | Admit: 2023-05-25 | Discharge: 2023-05-25 | Disposition: A | Discharge status: Home / Self Care | Payer: Medicare HMO | Source: Ambulatory Visit | Attending: Internal Medicine | Admitting: Internal Medicine

## 2023-05-25 DIAGNOSIS — C3411 Malignant neoplasm of upper lobe, right bronchus or lung: Secondary | ICD-10-CM | POA: Diagnosis not present

## 2023-05-25 DIAGNOSIS — I700 Atherosclerosis of aorta: Secondary | ICD-10-CM | POA: Diagnosis not present

## 2023-05-25 DIAGNOSIS — C3490 Malignant neoplasm of unspecified part of unspecified bronchus or lung: Secondary | ICD-10-CM | POA: Diagnosis not present

## 2023-05-25 DIAGNOSIS — J439 Emphysema, unspecified: Secondary | ICD-10-CM | POA: Diagnosis not present

## 2023-05-25 MED ORDER — IOHEXOL 300 MG/ML  SOLN
85.0000 mL | Freq: Once | INTRAMUSCULAR | Status: AC | PRN
  Administered 2023-05-25: 85 mL via INTRAVENOUS

## 2023-06-02 ENCOUNTER — Other Ambulatory Visit: Payer: Medicare HMO

## 2023-06-02 ENCOUNTER — Ambulatory Visit: Payer: Medicare HMO | Admitting: Internal Medicine

## 2023-06-02 ENCOUNTER — Ambulatory Visit: Payer: Medicare HMO

## 2023-06-02 DIAGNOSIS — J441 Chronic obstructive pulmonary disease with (acute) exacerbation: Secondary | ICD-10-CM | POA: Diagnosis not present

## 2023-06-02 DIAGNOSIS — J9601 Acute respiratory failure with hypoxia: Secondary | ICD-10-CM | POA: Diagnosis not present

## 2023-06-03 ENCOUNTER — Encounter: Payer: Self-pay | Admitting: Internal Medicine

## 2023-06-03 ENCOUNTER — Inpatient Hospital Stay (HOSPITAL_BASED_OUTPATIENT_CLINIC_OR_DEPARTMENT_OTHER): Payer: Medicare HMO | Admitting: Internal Medicine

## 2023-06-03 ENCOUNTER — Inpatient Hospital Stay: Payer: Medicare HMO | Attending: Internal Medicine

## 2023-06-03 ENCOUNTER — Inpatient Hospital Stay: Payer: Medicare HMO

## 2023-06-03 DIAGNOSIS — Z9981 Dependence on supplemental oxygen: Secondary | ICD-10-CM | POA: Insufficient documentation

## 2023-06-03 DIAGNOSIS — Z7189 Other specified counseling: Secondary | ICD-10-CM

## 2023-06-03 DIAGNOSIS — Z87891 Personal history of nicotine dependence: Secondary | ICD-10-CM | POA: Insufficient documentation

## 2023-06-03 DIAGNOSIS — N400 Enlarged prostate without lower urinary tract symptoms: Secondary | ICD-10-CM | POA: Diagnosis not present

## 2023-06-03 DIAGNOSIS — Z79890 Hormone replacement therapy (postmenopausal): Secondary | ICD-10-CM | POA: Insufficient documentation

## 2023-06-03 DIAGNOSIS — R0602 Shortness of breath: Secondary | ICD-10-CM | POA: Insufficient documentation

## 2023-06-03 DIAGNOSIS — Z823 Family history of stroke: Secondary | ICD-10-CM | POA: Diagnosis not present

## 2023-06-03 DIAGNOSIS — Z8249 Family history of ischemic heart disease and other diseases of the circulatory system: Secondary | ICD-10-CM | POA: Insufficient documentation

## 2023-06-03 DIAGNOSIS — Z79899 Other long term (current) drug therapy: Secondary | ICD-10-CM | POA: Diagnosis not present

## 2023-06-03 DIAGNOSIS — M2550 Pain in unspecified joint: Secondary | ICD-10-CM | POA: Diagnosis not present

## 2023-06-03 DIAGNOSIS — I2510 Atherosclerotic heart disease of native coronary artery without angina pectoris: Secondary | ICD-10-CM | POA: Diagnosis not present

## 2023-06-03 DIAGNOSIS — Z923 Personal history of irradiation: Secondary | ICD-10-CM | POA: Diagnosis not present

## 2023-06-03 DIAGNOSIS — M546 Pain in thoracic spine: Secondary | ICD-10-CM | POA: Insufficient documentation

## 2023-06-03 DIAGNOSIS — J439 Emphysema, unspecified: Secondary | ICD-10-CM | POA: Insufficient documentation

## 2023-06-03 DIAGNOSIS — C7951 Secondary malignant neoplasm of bone: Secondary | ICD-10-CM | POA: Diagnosis not present

## 2023-06-03 DIAGNOSIS — R5383 Other fatigue: Secondary | ICD-10-CM | POA: Insufficient documentation

## 2023-06-03 DIAGNOSIS — C3411 Malignant neoplasm of upper lobe, right bronchus or lung: Secondary | ICD-10-CM

## 2023-06-03 DIAGNOSIS — E039 Hypothyroidism, unspecified: Secondary | ICD-10-CM | POA: Insufficient documentation

## 2023-06-03 DIAGNOSIS — Z7289 Other problems related to lifestyle: Secondary | ICD-10-CM | POA: Diagnosis not present

## 2023-06-03 DIAGNOSIS — K5730 Diverticulosis of large intestine without perforation or abscess without bleeding: Secondary | ICD-10-CM | POA: Diagnosis not present

## 2023-06-03 DIAGNOSIS — I700 Atherosclerosis of aorta: Secondary | ICD-10-CM | POA: Diagnosis not present

## 2023-06-03 DIAGNOSIS — Z79620 Long term (current) use of immunosuppressive biologic: Secondary | ICD-10-CM | POA: Insufficient documentation

## 2023-06-03 DIAGNOSIS — Z9221 Personal history of antineoplastic chemotherapy: Secondary | ICD-10-CM | POA: Insufficient documentation

## 2023-06-03 DIAGNOSIS — Z5112 Encounter for antineoplastic immunotherapy: Secondary | ICD-10-CM | POA: Insufficient documentation

## 2023-06-03 DIAGNOSIS — Z801 Family history of malignant neoplasm of trachea, bronchus and lung: Secondary | ICD-10-CM | POA: Diagnosis not present

## 2023-06-03 LAB — CBC WITH DIFFERENTIAL/PLATELET
Abs Immature Granulocytes: 0.02 10*3/uL (ref 0.00–0.07)
Basophils Absolute: 0 10*3/uL (ref 0.0–0.1)
Basophils Relative: 1 %
Eosinophils Absolute: 0.3 10*3/uL (ref 0.0–0.5)
Eosinophils Relative: 5 %
HCT: 40.1 % (ref 39.0–52.0)
Hemoglobin: 12 g/dL — ABNORMAL LOW (ref 13.0–17.0)
Immature Granulocytes: 0 %
Lymphocytes Relative: 20 %
Lymphs Abs: 1.1 10*3/uL (ref 0.7–4.0)
MCH: 29.6 pg (ref 26.0–34.0)
MCHC: 29.9 g/dL — ABNORMAL LOW (ref 30.0–36.0)
MCV: 98.8 fL (ref 80.0–100.0)
Monocytes Absolute: 0.5 10*3/uL (ref 0.1–1.0)
Monocytes Relative: 9 %
Neutro Abs: 3.6 10*3/uL (ref 1.7–7.7)
Neutrophils Relative %: 65 %
Platelets: 147 10*3/uL — ABNORMAL LOW (ref 150–400)
RBC: 4.06 MIL/uL — ABNORMAL LOW (ref 4.22–5.81)
RDW: 12.3 % (ref 11.5–15.5)
WBC: 5.6 10*3/uL (ref 4.0–10.5)
nRBC: 0 % (ref 0.0–0.2)

## 2023-06-03 LAB — COMPREHENSIVE METABOLIC PANEL
ALT: 14 U/L (ref 0–44)
AST: 19 U/L (ref 15–41)
Albumin: 4.2 g/dL (ref 3.5–5.0)
Alkaline Phosphatase: 43 U/L (ref 38–126)
Anion gap: 8 (ref 5–15)
BUN: 21 mg/dL (ref 8–23)
CO2: 43 mmol/L — ABNORMAL HIGH (ref 22–32)
Calcium: 9.4 mg/dL (ref 8.9–10.3)
Chloride: 86 mmol/L — ABNORMAL LOW (ref 98–111)
Creatinine, Ser: 0.61 mg/dL (ref 0.61–1.24)
GFR, Estimated: >60 mL/min (ref >60)
Glucose, Bld: 104 mg/dL — ABNORMAL HIGH (ref 70–99)
Potassium: 4.3 mmol/L (ref 3.5–5.1)
Sodium: 137 mmol/L (ref 135–145)
Total Bilirubin: 0.4 mg/dL (ref <1.2)
Total Protein: 7.1 g/dL (ref 6.5–8.1)

## 2023-06-03 MED ORDER — SODIUM CHLORIDE 0.9 % IV SOLN
200.0000 mg | Freq: Once | INTRAVENOUS | Status: AC
  Administered 2023-06-03: 200 mg via INTRAVENOUS
  Filled 2023-06-03: qty 200

## 2023-06-03 MED ORDER — SODIUM CHLORIDE 0.9 % IV SOLN
Freq: Once | INTRAVENOUS | Status: AC
  Filled 2023-06-03: qty 250

## 2023-06-03 NOTE — Progress Notes (Signed)
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   # 2021- MAY 26th-Keytrda x2 cycles; [borderline PS] July 7th-carbo Alimta Keytruda cycle #1  #November 2022-oligometastatic progression-right upper lobe subpleural involvement with; s/p radiation finished December 15.  Temporarily held [place].  #Jan fourth 2023-restarted Keytruda.  # NGS/MOLECULAR TESTS:   # PALLIATIVE CARE EVALUATION:  # PAIN MANAGEMENT:    DIAGNOSIS:   STAGE:         ;  GOALS:  CURRENT/MOST RECENT THERAPY :     Cancer of upper lobe of right lung (HCC)  11/23/2019 Initial Diagnosis   Cancer of upper lobe of right lung (HCC)   12/16/2019 -  Chemotherapy   Patient is on Treatment Plan : LUNG Carboplatin (5) + Pemetrexed (500) + Pembrolizumab (200) D1 q21d Induction x 4 cycles / Maintenance Pemetrexed (500) + Pembrolizumab (200) D1 q21d     12/21/2019 - 02/26/2022 Chemotherapy   Patient is on Treatment Plan : LUNG CARBOplatin / Pemetrexed / Pembrolizumab q21d Induction x 4 cycles / Maintenance Pemetrexed + Pembrolizumab      HISTORY OF PRESENTING ILLNESS: Thin built frail appearing male patient.  No acute distress. Alone.  Nasal cannula oxygen.  Ambulating independently.    Cody [name] 67 y.o.  male patient  with metastatic non-small cell lung cancer; cord compression status post decompressive surgery; currently on Keytruda maintenance-is here for follow-up/review results of the CT scan.  Complains of chronic mild mid back pain. No cough. Dyspnea as usual. On continuous oxygen. Low appetite, snacks and sandwich or soup. Bowels regular.     Review of Systems  Constitutional:  Positive for malaise/fatigue. Negative for chills, diaphoresis and fever.  HENT:  Negative for nosebleeds and sore throat.   Eyes:  Negative for double vision.  Respiratory:  Positive for shortness of breath. Negative for cough, hemoptysis, sputum production and wheezing.   Cardiovascular:  Negative for chest pain, palpitations, orthopnea and leg swelling.  Gastrointestinal:  Negative for abdominal pain, blood in stool, constipation, heartburn, melena, nausea and vomiting.  Genitourinary:  Negative for dysuria, frequency and urgency.  Musculoskeletal:  Positive for back pain and joint pain.  Skin: Negative.  Negative for itching and rash.  Neurological:  Negative for dizziness, tingling, focal weakness, weakness and headaches.  Endo/Heme/Allergies:  Does not bruise/bleed easily.  Psychiatric/Behavioral:  Negative for depression. The patient is not nervous/anxious and does not have insomnia.      MEDICAL HISTORY:  Past Medical History:  Diagnosis Date   Allergy    Cancer of upper lobe of right lung (HCC) 10/2019   COPD (chronic obstructive pulmonary disease) (HCC)    Prostate enlargement     SURGICAL HISTORY: Past Surgical History:  Procedure Laterality Date   HERNIA REPAIR Bilateral 1982   HERNIA REPAIR Right 1994   IR IMAGING GUIDED PORT INSERTION  02/24/2020   LAMINECTOMY FOR EXCISION / EVACUATION  INTRASPINAL LESION  11/07/2019   T1-T 6 at [facility]     SOCIAL HISTORY: Social History   Socioeconomic History   Marital status: Married    Spouse name: [name]    Number of children: 4   Years of education: Not on  file   Highest education level: Some college, no degree  Occupational History   Occupation: dispatch and delivery   Tobacco Use   Smoking status: Former    Current packs/day: 0.00    Average packs/day: 2.0 packs/day for 30.0 years (60.0 ttl pk-yrs)    Types: Cigarettes    Start date: 07/28/1981    Quit date: 07/29/2011    Years since quitting: 11.8   Smokeless tobacco: Never   Tobacco comments:    pt vapes  Vaping Use   Vaping status: Not on file  Substance and Sexual Activity   Alcohol use: Yes    Alcohol/week: 5.0 standard drinks of alcohol    Types: 5 Standard drinks or equivalent per week    Comment: 1-2/day   Drug use: No   Sexual activity: Not Currently    Partners: Female  Other Topics Concern   Not on file  Social History Narrative   Worked in warehouse/ quit smoking in 2015; beer 1/day; in [place]; with wife.    Social Determinants of Health   Financial Resource Strain: Low Risk  (05/17/2023)   Overall Financial Resource Strain (CARDIA)    Difficulty of Paying Living Expenses: Not hard at all  Food Insecurity: No Food Insecurity (05/17/2023)   Hunger Vital Sign    Worried About Running Out of Food in the Last Year: Never true    Ran Out of Food in the Last Year: Never true  Transportation Needs: No Transportation Needs (05/17/2023)   PRAPARE - Administrator, Civil Service (Medical): No    Lack of Transportation (Non-Medical): No  Physical Activity: Unknown (05/17/2023)   Exercise Vital Sign    Days of Exercise per Week: Patient declined    Minutes of Exercise per Session: Not on file  Stress: No Stress Concern Present (05/17/2023)   [employer] of Occupational Health - Occupational Stress Questionnaire    Feeling of Stress : Only a little  Social Connections: Socially Integrated (05/17/2023)   Social Connection and Isolation Panel [NHANES]    Frequency of Communication with Friends and Family: More than three times a week    Frequency of  Social Gatherings with Friends and Family: Once a week    Attends Religious Services: More than 4 times per year    Active Member of [employer] or Organizations: Yes    Attends Engineer, structural: More than 4 times per year    Marital Status: Married  Catering manager Violence: Not At Risk (10/21/2019)   Humiliation, Afraid, Rape, and Kick questionnaire    Fear of Current or Ex-Partner: No    Emotionally Abused: No    Physically Abused: No    Sexually Abused: No    FAMILY HISTORY: Family History  Problem Relation Age of Onset   Heart disease Father    CVA Mother    Lung cancer Sister    Lung cancer Brother     ALLERGIES:  has No Known Allergies.  MEDICATIONS:  Current Outpatient Medications  Medication Sig Dispense Refill   acetaminophen (TYLENOL) 500 MG tablet Take 500-1,000 mg by mouth every 6 (six) hours as needed for mild pain or fever.      albuterol (VENTOLIN  HFA) 108 (90 Base) MCG/ACT inhaler Inhale 2 puffs into the lungs every 4 (four) hours as needed for wheezing or shortness of breath. 18 g 0   Calcium Carb-Cholecalciferol (OYSTER SHELL CALCIUM) 500-400 MG-UNIT TABS Take 500 tablets by mouth.     escitalopram (LEXAPRO) 5 MG tablet Take 1 tablet (5 mg total) by mouth every morning. 90 tablet 3   finasteride (PROSCAR) 5 MG tablet Take 1 tablet (5 mg total) by mouth daily. 90 tablet 0   levothyroxine (SYNTHROID) 50 MCG tablet TAKE 1 TABLET BY MOUTH DAILY BEFORE BREAKFAST 90 tablet 2   Loratadine 10 MG CAPS      Multiple Vitamin (MULTIVITAMIN) tablet Take 1 tablet by mouth daily.     pravastatin (PRAVACHOL) 20 MG tablet Take 1 tablet (20 mg total) by mouth daily. 90 tablet 3   QUEtiapine (SEROQUEL) 25 MG tablet Take 1 tablet (25 mg total) by mouth at bedtime. 90 tablet 1   tamsulosin (FLOMAX) 0.4 MG CAPS capsule Take 1 capsule (0.4 mg total) by mouth daily. 90 capsule 0   No current facility-administered medications for this visit.   Facility-Administered  Medications Ordered in Other Visits  Medication Dose Route Frequency Provider Last Rate Last Admin   0.9 %  sodium chloride infusion   Intravenous Once Louretta Shorten R, MD       heparin lock flush 100 UNIT/ML injection            pembrolizumab (KEYTRUDA) 200 mg in sodium chloride 0.9 % 50 mL chemo infusion  200 mg Intravenous Once Earna Coder, MD         PHYSICAL EXAMINATION: ECOG PERFORMANCE STATUS: 3 - Symptomatic, >50% confined to bed  Vitals:   06/03/23 1439  BP: 135/78  Pulse: 83  Temp: (!) 96.9 F (36.1 C)  SpO2: 100%    Filed Weights   06/03/23 1439  Weight: 122 lb 3.2 oz (55.4 kg)         Physical Exam HENT:     Head: Normocephalic and atraumatic.     Mouth/Throat:     Pharynx: No oropharyngeal exudate.  Eyes:     Pupils: Pupils are equal, round, and reactive to light.  Cardiovascular:     Rate and Rhythm: Normal rate and regular rhythm.  Pulmonary:     Effort: No respiratory distress.     Breath sounds: No wheezing.     Comments: Decreased air entry bilaterally. Abdominal:     General: Bowel sounds are normal. There is no distension.     Palpations: Abdomen is soft. There is no mass.     Tenderness: There is no abdominal tenderness. There is no guarding or rebound.  Musculoskeletal:        General: No tenderness. Normal range of motion.     Cervical back: Normal range of motion and neck supple.  Skin:    General: Skin is warm.  Neurological:     Mental Status: He is alert and oriented to person, place, and time.  Psychiatric:        Mood and Affect: Affect normal.      LABORATORY DATA:  I have reviewed the data as listed Lab Results  Component Value Date   WBC 5.6 06/03/2023   HGB 12.0 (L) 06/03/2023   HCT 40.1 06/03/2023   MCV 98.8 06/03/2023   PLT 147 (L) 06/03/2023   Recent Labs    04/22/23 0838 05/13/23 1434 06/03/23 1424  NA 136 138 137  K 4.0  3.9 4.3  CL 90* 89* 86*  CO2 40* 41* 43*  GLUCOSE 116* 110* 104*   BUN 12 10 21   CREATININE 0.57* 0.52* 0.61  CALCIUM 9.2 8.7* 9.4  GFRNONAA >60 >60 >60  PROT 7.2 6.9 7.1  ALBUMIN 4.1 3.9 4.2  AST 21 18 19   ALT 13 13 14   ALKPHOS 52 49 43  BILITOT 0.5 0.5 0.4    RADIOGRAPHIC STUDIES: I have personally reviewed the radiological images as listed and agreed with the findings in the report. CT CHEST ABDOMEN PELVIS W CONTRAST  Result Date: 06/03/2023 CLINICAL DATA:  Metastatic lung cancer restaging, status post chemotherapy and radiation * Tracking Code: BO * EXAM: CT CHEST, ABDOMEN, AND PELVIS WITH CONTRAST TECHNIQUE: Multidetector CT imaging of the chest, abdomen and pelvis was performed following the standard protocol during bolus administration of intravenous contrast. RADIATION DOSE REDUCTION: This exam was performed according to the departmental dose-optimization program which includes automated exposure control, adjustment of the mA and/or kV according to patient size and/or use of iterative reconstruction technique. CONTRAST:  85mL OMNIPAQUE IOHEXOL 300 MG/ML  SOLN COMPARISON:  01/21/2023 FINDINGS: CT CHEST FINDINGS Cardiovascular: Right chest port catheter. Aortic atherosclerosis. Normal heart size. Left coronary artery calcifications. No pericardial effusion. Mediastinum/Nodes: No enlarged mediastinal, hilar, or axillary lymph nodes. Thyroid gland, trachea, and esophagus demonstrate no significant findings. Lungs/Pleura: Severe emphysema. Diffuse bilateral bronchial wall thickening. Unchanged post treatment appearance of the right pulmonary apex with fibrotic scarring and volume loss (series 3, image 30). No pleural effusion or pneumothorax. Musculoskeletal: No chest wall abnormality. No acute osseous findings. Unchanged, sclerotic, expansile lesions of the posterior right third through fifth ribs (series 2, image 133). Unchanged posterior bridging fusion of T1-T6 across sclerotic pathologic fractures of T3-T4 (series 6, image 80). CT ABDOMEN PELVIS FINDINGS  Hepatobiliary: No solid liver abnormality is seen. No gallstones, gallbladder wall thickening, or biliary dilatation. Pancreas: Unremarkable. No pancreatic ductal dilatation or surrounding inflammatory changes. Spleen: Normal in size without significant abnormality. Adrenals/Urinary Tract: Adrenal glands are unremarkable. 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 Kidneys are otherwise normal, without renal calculi, suspicious solid lesion, or hydronephrosis. Diffuse thickening of the urinary bladder. Stomach/Bowel: Stomach is within normal limits. Appendix appears normal. No evidence of bowel wall thickening, distention, or inflammatory changes. Sigmoid diverticulosis. Vascular/Lymphatic: Aortic atherosclerosis. No enlarged abdominal or pelvic lymph nodes. Reproductive: Prostatomegaly. Other: Status post right inguinal hernia repair.  No ascites. Musculoskeletal: No acute osseous findings. IMPRESSION: 1. Unchanged post treatment appearance of the right pulmonary apex with fibrotic scarring and volume loss. 2. Unchanged, sclerotic, expansile lesions of the posterior right third through fifth ribs. 3. Unchanged posterior bridging fusion of T1-T6 across sclerotic pathologic fractures of T3-T4. 4. No evidence of lymphadenopathy or soft tissue metastatic disease in the chest, abdomen, or pelvis. 5. Emphysema and diffuse bilateral bronchial wall thickening. 6. Coronary artery disease. 7. Prostatomegaly urinary bladder wall thickening, consistent with chronic outlet obstruction. Aortic Atherosclerosis (ICD10-I70.0) and Emphysema (ICD10-J43.9). Electronically Signed   By: Jearld Lesch M.D.   On: 06/03/2023 13:30     ASSESSMENT & PLAN:   Cancer of upper lobe of right lung (HCC) # [april2021] Stage IV metastatic non-small cell lung cancer favor adeno; mets to bone; liver.   Oligo-metastatic progression-s/p  RT right upper lobe mass [s/p dec 15th, 2022].  Currently on Keytruda maintenance.   # June 26th, 2024- CT CAP-  Unchanged  post treatment appearance of the right pulmonary apex with fibrotic scarring and volume loss; Unchanged sclerotic, expansile appearance of the posterior right third through fifth ribs consistent with treated metastases; No evidence of lymphadenopathy or soft tissue metastatic disease in the chest, abdomen, or pelvis. Stable; will order CT CAP today. 1. Unchanged post treatment appearance of the right pulmonary apex with fibrotic scarring and volume loss. 2. Unchanged, sclerotic, expansile lesions of the posterior right third through fifth ribs. 3. Unchanged posterior bridging fusion of T1-T6 across sclerotic pathologic fractures of T3-T4. 4. No evidence of lymphadenopathy or soft tissue metastatic disease in the chest, abdomen, or pelvis. 5. Emphysema and diffuse bilateral bronchial wall thickening. 6. Coronary artery disease. 7. Prostatomegaly urinary bladder wall thickening, consistent with chronic outlet obstruction.   Aortic Atherosclerosis (ICD10-I70.0) and Emphysema (ICD10-J43.9).     Electronically Signed   By: Jearld Lesch M.D.   On: 06/03/2023 13:30  # proceed with [place] maintainance today. Labs today reviewed;  acceptable for treatment today.   stable   # Bone metastases- [[facility], Dr.Parks]. Continue multivitamin with calcium plus vitamin D 3 times a day.  S/p extractions. jan 19th, 2024; s/p dental clearance [April 2024]. vit D 25 [place]- march 2024- 70. Currently on Zometa 3 mg q 3 M. stable   # Hypothyroidism- [ Mid sep 2022]  Normal T3-T4.  Currently on 50 mcg a day; JUNE 2024- WNL- stable   # COPD-. On 2-3 lit O2; Trelegy.Clinically- stable   # Emphysema prostatomegaly- s/p  Dr.Brandon re: prostate enlargement- on Flomax/Proscar [Dr.Bradon]- PSA- 10.3-stable   # Flu shot [OCT 2024]  HOLD ZOMETA- last 03/11/2023 q 77M;    #  DISPOSITION: # Keytruda today- wait for the labs- # as planned- follow up in 3 weeks-MD-  port/labs- cbc/cmp;[facility]- Keytruda; zometa -Dr.B    All questions were answered. The patient knows to call the clinic with any problems, questions or concerns.    Earna Coder, MD 06/03/2023 3:09 PM

## 2023-06-03 NOTE — Patient Instructions (Signed)
[facility] - A DEPT OF [facility]  Discharge Instructions: Thank you for choosing [facility] to provide your oncology and hematology care.  If you have a lab appointment with the Cancer Center, please go directly to the Cancer Center and check in at the registration area.  Wear comfortable clothing and clothing appropriate for easy access to any Portacath or PICC line.   We strive to give you quality time with your provider. You may need to reschedule your appointment if you arrive late (15 or more minutes).  Arriving late affects you and other patients whose appointments are after yours.  Also, if you miss three or more appointments without notifying the office, you may be dismissed from the clinic at the provider's discretion.      For prescription refill requests, have your pharmacy contact our office and allow 72 hours for refills to be completed.    Today you received the following chemotherapy and/or immunotherapy agents Keytruda      To help prevent nausea and vomiting after your treatment, we encourage you to take your nausea medication as directed.  BELOW ARE SYMPTOMS THAT SHOULD BE REPORTED IMMEDIATELY: *FEVER GREATER THAN 100.4 F (38 C) OR HIGHER *CHILLS OR SWEATING *NAUSEA AND VOMITING THAT IS NOT CONTROLLED WITH YOUR NAUSEA MEDICATION *UNUSUAL SHORTNESS OF BREATH *UNUSUAL BRUISING OR BLEEDING *URINARY PROBLEMS (pain or burning when urinating, or frequent urination) *BOWEL PROBLEMS (unusual diarrhea, constipation, pain near the anus) TENDERNESS IN MOUTH AND THROAT WITH OR WITHOUT PRESENCE OF ULCERS (sore throat, sores in mouth, or a toothache) UNUSUAL RASH, SWELLING OR PAIN  UNUSUAL VAGINAL DISCHARGE OR ITCHING   Items with * indicate a potential emergency and should be followed up as soon as possible or go to the Emergency Department if any problems should occur.  Please show the CHEMOTHERAPY ALERT CARD or IMMUNOTHERAPY  ALERT CARD at check-in to the Emergency Department and triage nurse.  Should you have questions after your visit or need to cancel or reschedule your appointment, please contact [facility] - A DEPT OF [facility]  [phone] and follow the prompts.  Office hours are 8:00 a.m. to 4:30 p.m. Monday - Friday. Please note that voicemails left after 4:00 p.m. may not be returned until the following business day.  We are closed weekends and major holidays. You have access to a nurse at all times for urgent questions. Please call the main number to the clinic [phone] and follow the prompts.  For any non-urgent questions, you may also contact your provider using MyChart. We now offer e-Visits for anyone 83 and older to request care online for non-urgent symptoms. For details visit [email or web address].   Also download the MyChart app! Go to the app store, search "MyChart", open the app, select , and log in with your MyChart username and password.

## 2023-06-03 NOTE — Assessment & Plan Note (Addendum)
# [  april2021] Stage IV metastatic non-small cell lung cancer favor adeno; mets to bone; liver.   Oligo-metastatic progression-s/p RT right upper lobe mass [s/p dec 15th, 2022].  Currently on Keytruda maintenance.   # June 26th, 2024- CT CAP-  Unchanged post treatment appearance of the right pulmonary apex with fibrotic scarring and volume loss; Unchanged sclerotic, expansile appearance of the posterior right third through fifth ribs consistent with treated metastases; No evidence of lymphadenopathy or soft tissue metastatic disease in the chest, abdomen, or pelvis. Stable; will order CT CAP today. 1. Unchanged post treatment appearance of the right pulmonary apex with fibrotic scarring and volume loss. 2. Unchanged, sclerotic, expansile lesions of the posterior right third through fifth ribs. 3. Unchanged posterior bridging fusion of T1-T6 across sclerotic pathologic fractures of T3-T4. 4. No evidence of lymphadenopathy or soft tissue metastatic disease in the chest, abdomen, or pelvis. 5. Emphysema and diffuse bilateral bronchial wall thickening. 6. Coronary artery disease. 7. Prostatomegaly urinary bladder wall thickening, consistent with chronic outlet obstruction.   Aortic Atherosclerosis (ICD10-I70.0) and Emphysema (ICD10-J43.9).     Electronically Signed   By: Jearld Lesch M.D.   On: 06/03/2023 13:30  # proceed with [place] maintainance today. Labs today reviewed;  acceptable for treatment today.   stable   # Bone metastases- [[facility], Dr.Parks]. Continue multivitamin with calcium plus vitamin D 3 times a day.  S/p extractions. jan 19th, 2024; s/p dental clearance [April 2024]. vit D 25 [place]- march 2024- 70. Currently on Zometa 3 mg q 3 M. stable   # Hypothyroidism- [ Mid sep 2022]  Normal T3-T4.  Currently on 50 mcg a day; JUNE 2024- WNL- stable   # COPD-. On 2-3 lit O2; Trelegy.Clinically- stable   # Emphysema prostatomegaly- s/p  Dr.Brandon re: prostate enlargement-  on Flomax/Proscar [Dr.Bradon]- PSA- 10.3-stable   # Flu shot [OCT 2024]  HOLD ZOMETA- last 03/11/2023 q 13M;    # DISPOSITION: # Keytruda today- wait for the labs- # as planned- follow up in 3 weeks-MD-  port/labs- cbc/cmp;[facility]- Keytruda; zometa -Dr.B

## 2023-06-03 NOTE — Progress Notes (Signed)
Occ mid back pain. No cough. Dyspnea as usual. On continuous oxygen. Low appetite, snacks and sandwich or soup. Bowels regular.

## 2023-06-04 ENCOUNTER — Ambulatory Visit: Payer: Medicare HMO

## 2023-06-04 ENCOUNTER — Other Ambulatory Visit: Payer: Medicare HMO

## 2023-06-04 ENCOUNTER — Ambulatory Visit: Payer: Medicare HMO | Admitting: Internal Medicine

## 2023-06-04 ENCOUNTER — Telehealth: Payer: Self-pay | Admitting: *Deleted

## 2023-06-04 NOTE — Telephone Encounter (Signed)
RN had faxed a Prescription for Portable oxygen concentrator to [employer] on 05/14/23. Patient told me yesterday in clinic he has not heard from the company. I called [employer] and spoke to Gazelle who told me she would reach out to patient to come into office for the equipment. She was not aware an order had been sent.

## 2023-06-11 ENCOUNTER — Other Ambulatory Visit: Payer: Self-pay | Admitting: Urology

## 2023-06-11 DIAGNOSIS — N400 Enlarged prostate without lower urinary tract symptoms: Secondary | ICD-10-CM

## 2023-06-20 ENCOUNTER — Other Ambulatory Visit: Payer: Self-pay | Admitting: Internal Medicine

## 2023-06-22 ENCOUNTER — Encounter: Payer: Self-pay | Admitting: Internal Medicine

## 2023-06-22 NOTE — Telephone Encounter (Signed)
Component Ref Range & Units 1 mo ago (05/13/23) 2 mo ago (04/22/23) 5 mo ago (01/07/23) 9 mo ago (09/24/22) 11 mo ago (07/23/22) 11 mo ago (07/02/22) 1 yr ago (04/09/22)  TSH 0.450 - 4.500 uIU/mL 2.730 3.733 R, CM 3.300 R, CM 3.641 R, CM 2.075 R, CM 3.897 R, CM 3.238 R, CM  T4, Total 4.5 - 12.0 ug/dL 8.8        T3 Uptake Ratio 24 - 39 % 25        Free Thyroxine Index 1.2 - 4.9 2.2        Comment: (NOTE) Performed At: [facility] [address] [place], [place] [id number] Jolene Schimke MD KZ:[id number]  Resulting Agency [facility]         Specimen Collected: 05/13/23 14:34 Last Resulted: 05/15/23 00:35

## 2023-06-24 ENCOUNTER — Inpatient Hospital Stay (HOSPITAL_BASED_OUTPATIENT_CLINIC_OR_DEPARTMENT_OTHER): Payer: Medicare HMO | Admitting: Internal Medicine

## 2023-06-24 ENCOUNTER — Encounter: Payer: Self-pay | Admitting: Internal Medicine

## 2023-06-24 ENCOUNTER — Other Ambulatory Visit: Payer: Medicare HMO

## 2023-06-24 ENCOUNTER — Ambulatory Visit: Payer: Medicare HMO | Admitting: Internal Medicine

## 2023-06-24 ENCOUNTER — Inpatient Hospital Stay: Payer: Medicare HMO

## 2023-06-24 ENCOUNTER — Ambulatory Visit: Payer: Medicare HMO

## 2023-06-24 VITALS — BP 117/71 | HR 79 | Temp 97.1°F | Resp 18

## 2023-06-24 VITALS — BP 130/73 | HR 95 | Temp 95.9°F | Ht 69.0 in | Wt 122.0 lb

## 2023-06-24 DIAGNOSIS — Z5112 Encounter for antineoplastic immunotherapy: Secondary | ICD-10-CM | POA: Diagnosis not present

## 2023-06-24 DIAGNOSIS — C3411 Malignant neoplasm of upper lobe, right bronchus or lung: Secondary | ICD-10-CM | POA: Diagnosis not present

## 2023-06-24 DIAGNOSIS — Z7189 Other specified counseling: Secondary | ICD-10-CM

## 2023-06-24 DIAGNOSIS — C7951 Secondary malignant neoplasm of bone: Secondary | ICD-10-CM

## 2023-06-24 LAB — CBC WITH DIFFERENTIAL/PLATELET
Abs Immature Granulocytes: 0.01 10*3/uL (ref 0.00–0.07)
Basophils Absolute: 0 10*3/uL (ref 0.0–0.1)
Basophils Relative: 1 %
Eosinophils Absolute: 0.3 10*3/uL (ref 0.0–0.5)
Eosinophils Relative: 6 %
HCT: 40.6 % (ref 39.0–52.0)
Hemoglobin: 12.4 g/dL — ABNORMAL LOW (ref 13.0–17.0)
Immature Granulocytes: 0 %
Lymphocytes Relative: 21 %
Lymphs Abs: 1 10*3/uL (ref 0.7–4.0)
MCH: 30.2 pg (ref 26.0–34.0)
MCHC: 30.5 g/dL (ref 30.0–36.0)
MCV: 99 fL (ref 80.0–100.0)
Monocytes Absolute: 0.6 10*3/uL (ref 0.1–1.0)
Monocytes Relative: 12 %
Neutro Abs: 2.9 10*3/uL (ref 1.7–7.7)
Neutrophils Relative %: 60 %
Platelets: 132 10*3/uL — ABNORMAL LOW (ref 150–400)
RBC: 4.1 MIL/uL — ABNORMAL LOW (ref 4.22–5.81)
RDW: 12.2 % (ref 11.5–15.5)
WBC: 4.8 10*3/uL (ref 4.0–10.5)
nRBC: 0 % (ref 0.0–0.2)

## 2023-06-24 LAB — COMPREHENSIVE METABOLIC PANEL
ALT: 13 U/L (ref 0–44)
AST: 19 U/L (ref 15–41)
Albumin: 4.1 g/dL (ref 3.5–5.0)
Alkaline Phosphatase: 51 U/L (ref 38–126)
Anion gap: 9 (ref 5–15)
BUN: 15 mg/dL (ref 8–23)
CO2: 39 mmol/L — ABNORMAL HIGH (ref 22–32)
Calcium: 8.9 mg/dL (ref 8.9–10.3)
Chloride: 91 mmol/L — ABNORMAL LOW (ref 98–111)
Creatinine, Ser: 0.53 mg/dL — ABNORMAL LOW (ref 0.61–1.24)
GFR, Estimated: >60 mL/min (ref >60)
Glucose, Bld: 106 mg/dL — ABNORMAL HIGH (ref 70–99)
Potassium: 4 mmol/L (ref 3.5–5.1)
Sodium: 139 mmol/L (ref 135–145)
Total Bilirubin: 0.8 mg/dL (ref <1.2)
Total Protein: 7 g/dL (ref 6.5–8.1)

## 2023-06-24 MED ORDER — HEPARIN SOD (PORK) LOCK FLUSH 100 UNIT/ML IV SOLN
500.0000 [IU] | Freq: Once | INTRAVENOUS | Status: AC
  Administered 2023-06-24: 500 [IU] via INTRAVENOUS
  Filled 2023-06-24: qty 5

## 2023-06-24 MED ORDER — SODIUM CHLORIDE 0.9 % IV SOLN
Freq: Once | INTRAVENOUS | Status: AC
  Filled 2023-06-24: qty 250

## 2023-06-24 MED ORDER — ZOLEDRONIC ACID 4 MG/5ML IV CONC
3.0000 mg | Freq: Once | INTRAVENOUS | Status: AC
  Administered 2023-06-24: 3 mg via INTRAVENOUS
  Filled 2023-06-24: qty 3.75

## 2023-06-24 MED ORDER — SODIUM CHLORIDE 0.9 % IV SOLN
200.0000 mg | Freq: Once | INTRAVENOUS | Status: AC
  Administered 2023-06-24: 200 mg via INTRAVENOUS
  Filled 2023-06-24: qty 200

## 2023-06-24 NOTE — Progress Notes (Signed)
[facility] CONSULT NOTE  Patient Care Team: Alba Cory, MD as PCP - General (Family Medicine) Kieth Brightly, MD (General Surgery) Glory Buff, RN as Oncology Nurse Navigator Earna Coder, MD as Consulting Physician (Hematology and Oncology)  CHIEF COMPLAINTS/PURPOSE OF CONSULTATION: Lung cancer  #  Oncology History Overview Note  # April 2021- RUL lung cancer; liver met; spinal cord compression/ vertebral mets [[facility]]; [[place]]; LIVER Bx- Metastatic adenocarcinoma, consistent with lung primary.- TPS % Interpretation -PD-L1 IHC 10 LOW EXPRESSION POSITIVE   # 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   # 2021- MAY 26th-Keytrda x2 cycles; [borderline PS] July 7th-carbo Alimta Keytruda cycle #1  #November 2022-oligometastatic progression-right upper lobe subpleural involvement with; s/p radiation finished December 15.  Temporarily held [place].  #Jan fourth 2023-restarted Keytruda.  # NGS/MOLECULAR TESTS:   # PALLIATIVE CARE EVALUATION:  # PAIN MANAGEMENT:    DIAGNOSIS:   STAGE:         ;  GOALS:  CURRENT/MOST RECENT THERAPY :     Cancer of upper lobe of right lung (HCC)  11/23/2019 Initial Diagnosis   Cancer of upper lobe of right lung (HCC)   12/16/2019 -  Chemotherapy   Patient is on Treatment Plan : LUNG Carboplatin (5) + Pemetrexed (500) + Pembrolizumab (200) D1 q21d Induction x 4 cycles / Maintenance Pemetrexed (500) + Pembrolizumab (200) D1 q21d     12/21/2019 - 02/26/2022 Chemotherapy   Patient is on Treatment Plan : LUNG CARBOplatin / Pemetrexed / Pembrolizumab q21d Induction x 4 cycles / Maintenance Pemetrexed + Pembrolizumab      HISTORY OF PRESENTING ILLNESS: Thin built frail appearing male patient.  No acute distress. Alone.  Nasal cannula oxygen.  Ambulating independently.    Cody [name] 67 y.o.  male patient  with metastatic non-small cell lung cancer; cord compression status post decompressive surgery; currently on Keytruda maintenance-is here for follow-up.   Complains of chronic mild mid back pain. No cough. Dyspnea as usual. On continuous oxygen. Low appetite, snacks and sandwich or soup. Bowels regular.     Review of Systems  Constitutional:  Positive for malaise/fatigue. Negative for chills, diaphoresis and fever.  HENT:  Negative for nosebleeds and sore throat.   Eyes:  Negative for double vision.  Respiratory:  Positive for shortness of breath. Negative for cough, hemoptysis, sputum production and wheezing.   Cardiovascular:  Negative for chest pain, palpitations, orthopnea and leg swelling.  Gastrointestinal:  Negative for abdominal pain, blood in stool, constipation, heartburn, melena, nausea and vomiting.  Genitourinary:  Negative for dysuria, frequency and urgency.  Musculoskeletal:  Positive for back pain and joint pain.  Skin: Negative.  Negative for itching and rash.  Neurological:  Negative for dizziness, tingling, focal weakness, weakness and headaches.  Endo/Heme/Allergies:  Does not bruise/bleed easily.  Psychiatric/Behavioral:  Negative for depression. The patient is not nervous/anxious and does not have insomnia.      MEDICAL HISTORY:  Past Medical History:  Diagnosis Date   Allergy    Cancer of upper lobe of right lung (HCC) 10/2019   COPD (chronic obstructive pulmonary disease) (HCC)    Prostate enlargement     SURGICAL HISTORY: Past Surgical History:  Procedure Laterality Date   HERNIA REPAIR Bilateral 1982   HERNIA REPAIR Right 1994   IR IMAGING GUIDED PORT INSERTION  02/24/2020   LAMINECTOMY FOR EXCISION / EVACUATION INTRASPINAL LESION  11/07/2019  T1-T 6 at [facility]     SOCIAL HISTORY: Social History   Socioeconomic History   Marital status: Married    Spouse name: [name]    Number of children: 4   Years of education: Not on file   Highest education  level: Some college, no degree  Occupational History   Occupation: dispatch and delivery   Tobacco Use   Smoking status: Former    Current packs/day: 0.00    Average packs/day: 2.0 packs/day for 30.0 years (60.0 ttl pk-yrs)    Types: Cigarettes    Start date: 07/28/1981    Quit date: 07/29/2011    Years since quitting: 11.9   Smokeless tobacco: Never   Tobacco comments:    pt vapes  Vaping Use   Vaping status: Not on file  Substance and Sexual Activity   Alcohol use: Yes    Alcohol/week: 5.0 standard drinks of alcohol    Types: 5 Standard drinks or equivalent per week    Comment: 1-2/day   Drug use: No   Sexual activity: Not Currently    Partners: Female  Other Topics Concern   Not on file  Social History Narrative   Worked in warehouse/ quit smoking in 2015; beer 1/day; in [place]; with wife.    Social Determinants of Health   Financial Resource Strain: Low Risk  (05/17/2023)   Overall Financial Resource Strain (CARDIA)    Difficulty of Paying Living Expenses: Not hard at all  Food Insecurity: No Food Insecurity (05/17/2023)   Hunger Vital Sign    Worried About Running Out of Food in the Last Year: Never true    Ran Out of Food in the Last Year: Never true  Transportation Needs: No Transportation Needs (05/17/2023)   PRAPARE - Administrator, Civil Service (Medical): No    Lack of Transportation (Non-Medical): No  Physical Activity: Unknown (05/17/2023)   Exercise Vital Sign    Days of Exercise per Week: Patient declined    Minutes of Exercise per Session: Not on file  Stress: No Stress Concern Present (05/17/2023)   [employer] of Occupational Health - Occupational Stress Questionnaire    Feeling of Stress : Only a little  Social Connections: Socially Integrated (05/17/2023)   Social Connection and Isolation Panel [NHANES]    Frequency of Communication with Friends and Family: More than three times a week    Frequency of Social Gatherings with  Friends and Family: Once a week    Attends Religious Services: More than 4 times per year    Active Member of [employer] or Organizations: Yes    Attends Engineer, structural: More than 4 times per year    Marital Status: Married  Catering manager Violence: Not At Risk (10/21/2019)   Humiliation, Afraid, Rape, and Kick questionnaire    Fear of Current or Ex-Partner: No    Emotionally Abused: No    Physically Abused: No    Sexually Abused: No    FAMILY HISTORY: Family History  Problem Relation Age of Onset   Heart disease Father    CVA Mother    Lung cancer Sister    Lung cancer Brother     ALLERGIES:  has No Known Allergies.  MEDICATIONS:  Current Outpatient Medications  Medication Sig Dispense Refill   acetaminophen (TYLENOL) 500 MG tablet Take 500-1,000 mg by mouth every 6 (six) hours as needed for mild pain or fever.      albuterol (VENTOLIN HFA) 108 (90 Base) MCG/ACT inhaler  Inhale 2 puffs into the lungs every 4 (four) hours as needed for wheezing or shortness of breath. 18 g 0   Calcium Carb-Cholecalciferol (OYSTER SHELL CALCIUM) 500-400 MG-UNIT TABS Take 500 tablets by mouth.     escitalopram (LEXAPRO) 5 MG tablet Take 1 tablet (5 mg total) by mouth every morning. 90 tablet 3   finasteride (PROSCAR) 5 MG tablet TAKE 1 TABLET BY MOUTH DAILY 90 tablet 1   levothyroxine (SYNTHROID) 50 MCG tablet TAKE 1 TABLET BY MOUTH DAILY BEFORE BREAKFAST 90 tablet 2   Loratadine 10 MG CAPS      Multiple Vitamin (MULTIVITAMIN) tablet Take 1 tablet by mouth daily.     pravastatin (PRAVACHOL) 20 MG tablet Take 1 tablet (20 mg total) by mouth daily. 90 tablet 3   QUEtiapine (SEROQUEL) 25 MG tablet Take 1 tablet (25 mg total) by mouth at bedtime. 90 tablet 1   tamsulosin (FLOMAX) 0.4 MG CAPS capsule TAKE 1 CAPSULE BY MOUTH DAILY 90 capsule 1   No current facility-administered medications for this visit.   Facility-Administered Medications Ordered in Other Visits  Medication Dose Route  Frequency Provider Last Rate Last Admin   heparin lock flush 100 UNIT/ML injection            pembrolizumab (KEYTRUDA) 200 mg in sodium chloride 0.9 % 50 mL chemo infusion  200 mg Intravenous Once Louretta Shorten R, MD       zoledronic acid (ZOMETA) 3 mg in sodium chloride 0.9 % 100 mL IVPB  3 mg Intravenous Once Earna Coder, MD         PHYSICAL EXAMINATION: ECOG PERFORMANCE STATUS: 3 - Symptomatic, >50% confined to bed  Vitals:   06/24/23 0846 06/24/23 0857  BP: (!) 143/91 130/73  Pulse: 95   Temp: (!) 95.9 F (35.5 C)   SpO2: (!) 82% 98%     Filed Weights   06/24/23 0846  Weight: 122 lb (55.3 kg)          Physical Exam HENT:     Head: Normocephalic and atraumatic.     Mouth/Throat:     Pharynx: No oropharyngeal exudate.  Eyes:     Pupils: Pupils are equal, round, and reactive to light.  Cardiovascular:     Rate and Rhythm: Normal rate and regular rhythm.  Pulmonary:     Effort: No respiratory distress.     Breath sounds: No wheezing.     Comments: Decreased air entry bilaterally. Abdominal:     General: Bowel sounds are normal. There is no distension.     Palpations: Abdomen is soft. There is no mass.     Tenderness: There is no abdominal tenderness. There is no guarding or rebound.  Musculoskeletal:        General: No tenderness. Normal range of motion.     Cervical back: Normal range of motion and neck supple.  Skin:    General: Skin is warm.  Neurological:     Mental Status: He is alert and oriented to person, place, and time.  Psychiatric:        Mood and Affect: Affect normal.      LABORATORY DATA:  I have reviewed the data as listed Lab Results  Component Value Date   WBC 4.8 06/24/2023   HGB 12.4 (L) 06/24/2023   HCT 40.6 06/24/2023   MCV 99.0 06/24/2023   PLT 132 (L) 06/24/2023   Recent Labs    05/13/23 1434 06/03/23 1424 06/24/23 0833  NA 138 137  139  K 3.9 4.3 4.0  CL 89* 86* 91*  CO2 41* 43* 39*  GLUCOSE 110*  104* 106*  BUN 10 21 15   CREATININE 0.52* 0.61 0.53*  CALCIUM 8.7* 9.4 8.9  GFRNONAA >60 >60 >60  PROT 6.9 7.1 7.0  ALBUMIN 3.9 4.2 4.1  AST 18 19 19   ALT 13 14 13   ALKPHOS 49 43 51  BILITOT 0.5 0.4 0.8    RADIOGRAPHIC STUDIES: I have personally reviewed the radiological images as listed and agreed with the findings in the report. No results found.   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   Oligo-metastatic progression-s/p RT right upper lobe mass [s/p dec 15th, 2022].  Currently on Keytruda maintenance. OCT 28th, 2024-  Unchanged post treatment appearance of the right pulmonary apex with fibrotic scarring and volume loss.  Unchanged, sclerotic, expansile lesions of the posterior right third through fifth ribs;  Unchanged posterior bridging fusion of T1-T6 across sclerotic pathologic fractures of T3-T4;  No evidence of lymphadenopathy or soft tissue metastatic disease in the chest, abdomen, or pelvis.   # proceed with [place] maintainance today. Labs today reviewed;  acceptable for treatment today- stable.   # Bone metastases- [[facility], Dr.Parks]. Continue multivitamin with calcium plus vitamin D 3 times a day.  S/p extractions. jan 19th, 2024; s/p dental clearance [April 2024]. vit D 25 [place]- march 2024- 70. Currently on Zometa 3 mg q 3 M. stable   # Hypothyroidism- [ Mid sep 2022]  Normal T3-T4.  Currently on 50 mcg a day; JUNE 2024- WNL- stable   # COPD-. On 2-3 lit O2; Trelegy.Clinically-stable however given ongoing severe COPD I think is reasonable to follow-up with pulmonary.  Will make a referral.  # prostatomegaly- s/p  Dr.Brandon re: prostate enlargement- on Flomax/Proscar [Dr.Bradon]- PSA- 10.3-stable   # Flu shot [OCT 2024]  HOLD ZOMETA- last  06/24/2023 q 76M;    # DISPOSITION: # referral to [facility] re: COPD.  # Keytruda today;  Zometa today # follow up in 3 weeks-MD-  port/labs- cbc/cmp;CEA- Keytruda;  # follow up in 6 weeks-MD-  port/labs- cbc/cmp;CEA; thyroid profile- Keytruda;-Dr.B    All questions were answered. The patient knows to call the clinic with any problems, questions or concerns.    Earna Coder, MD 06/24/2023 9:52 AM

## 2023-06-24 NOTE — Progress Notes (Signed)
O2-4L. Has had more sob x2 days. O2 dropped to 82 after walk to exam room.  Has had some diarrhea x2. He thinks it may be the peanut butter he ate.

## 2023-06-24 NOTE — Assessment & Plan Note (Addendum)
# [  april2021] Stage IV metastatic non-small cell lung cancer favor adeno; mets to bone; liver.   Oligo-metastatic progression-s/p RT right upper lobe mass [s/p dec 15th, 2022].  Currently on Keytruda maintenance. OCT 28th, 2024-  Unchanged post treatment appearance of the right pulmonary apex with fibrotic scarring and volume loss.  Unchanged, sclerotic, expansile lesions of the posterior right third through fifth ribs;  Unchanged posterior bridging fusion of T1-T6 across sclerotic pathologic fractures of T3-T4;  No evidence of lymphadenopathy or soft tissue metastatic disease in the chest, abdomen, or pelvis.   # proceed with [place] maintainance today. Labs today reviewed;  acceptable for treatment today- stable.   # Bone metastases- [[facility], Dr.Parks]. Continue multivitamin with calcium plus vitamin D 3 times a day.  S/p extractions. jan 19th, 2024; s/p dental clearance [April 2024]. vit D 25 OH- march 2024- 70. Currently on Zometa 3 mg q 3 M. stable   # Hypothyroidism- [ Mid sep 2022]  Normal T3-T4.  Currently on 50 mcg a day; JUNE 2024- WNL- stable   # COPD-. On 2-3 lit O2; Trelegy.Clinically-stable however given ongoing severe COPD I think is reasonable to follow-up with pulmonary.  Will make a referral.  # prostatomegaly- s/p  Dr.Brandon re: prostate enlargement- on Flomax/Proscar [Dr.Bradon]- PSA- 10.3-stable   # Flu shot [OCT 2024]  HOLD ZOMETA- last  06/24/2023 q 28M;    # DISPOSITION: # referral to [facility] re: COPD.  # Keytruda today; Zometa today # follow up in 3 weeks-MD-  port/labs- cbc/cmp;CEA- Keytruda;  # follow up in 6 weeks-MD-  port/labs- cbc/cmp;CEA; thyroid profile- Keytruda;-Dr.B

## 2023-06-25 LAB — CEA: CEA: 2.4 ng/mL (ref 0.0–4.7)

## 2023-07-02 ENCOUNTER — Encounter: Payer: Self-pay | Admitting: Pulmonary Disease

## 2023-07-02 ENCOUNTER — Ambulatory Visit: Payer: Medicare HMO | Admitting: Pulmonary Disease

## 2023-07-02 VITALS — BP 106/66 | HR 83 | Temp 97.6°F | Ht 69.0 in | Wt 121.8 lb

## 2023-07-02 DIAGNOSIS — J9601 Acute respiratory failure with hypoxia: Secondary | ICD-10-CM | POA: Diagnosis not present

## 2023-07-02 DIAGNOSIS — J432 Centrilobular emphysema: Secondary | ICD-10-CM

## 2023-07-02 DIAGNOSIS — J9611 Chronic respiratory failure with hypoxia: Secondary | ICD-10-CM | POA: Diagnosis not present

## 2023-07-02 DIAGNOSIS — J441 Chronic obstructive pulmonary disease with (acute) exacerbation: Secondary | ICD-10-CM | POA: Diagnosis not present

## 2023-07-02 MED ORDER — IPRATROPIUM-ALBUTEROL 0.5-2.5 (3) MG/3ML IN SOLN: 3.0000 mL | Freq: Four times a day (QID) | RESPIRATORY_TRACT | 12 refills | Status: DC

## 2023-07-02 NOTE — Progress Notes (Signed)
Synopsis: Referred in by Earna Coder, *   Subjective:   PATIENT ID: Cody [name] GENDER: male DOB: 01-02-1956, MRN: [id number]  Chief Complaint  Patient presents with   Consult    No cough or wheezing. Shortness of breath on exertion and at occasional rest. On 4L continuous oxygen. No inhalers.     HPI Cody [name] is a 67 year old pleasant gentleman with a past medical history of right upper lobe lung cancer with liver mets and spinal cord compression status post T1 T6 laminectomy and posterior fusion in 2021 at [facility], status post chemoradiation and currently maintained on Keytruda, COPD with significant panlobular emphysema complicated by chronic hypoxic respiratory failure on 4 L nasal cannula presenting today to the pulmonary clinic for further management of his COPD.  He reports that since his cancer diagnosis and specifically after his neck surgery he started having chronic respiratory failure requiring oxygen support.  For the last 6 months to a year his shortness of breath has gotten worse mostly on exertion associated with chest tightness.  He was on Trelegy but felt it was not doing much for him and therefore has been on no inhalers for the past 6 months.  CT chest 10/24 shows stable postradiation right upper lobe fibrotic changes with volume loss.  No evidence of disease progression.  It also shows significant emphysema diffuse bilaterally.  Family history: Both his siblings had lung cancer.  Social history: Previous smoker smoked 2 packs/day for 30 years vaped for few years after that and quit in 2013.  ROS All systems were reviewed and are negative except for the above. Objective:   Vitals:   07/02/23 1531  BP: 106/66  Pulse: 83  Temp: 97.6 F (36.4 C)  TempSrc: Temporal  SpO2: 97%  Weight: 121 lb 12.8 oz (55.2 kg)  Height: 5\' 9"  (1.753 m)   97% on 4 LPM BMI Readings from Last 3 Encounters:  07/02/23 17.99 kg/m  06/24/23 18.02 kg/m  06/03/23  18.05 kg/m   Wt Readings from Last 3 Encounters:  07/02/23 121 lb 12.8 oz (55.2 kg)  06/24/23 122 lb (55.3 kg)  06/03/23 122 lb 3.2 oz (55.4 kg)    Physical Exam GEN: NAD, Healthy Appearing HEENT: Supple Neck, Reactive Pupils, EOMI  CVS: Normal S1, Normal S2, RRR, No murmurs or ES appreciated  Lungs: Bibasilar crackles noted. Poor air movement.  Abdomen: Soft, non tender, non distended, + BS  Extremities: Warm and well perfused, No edema  Skin: No suspicious lesions appreciated  Psych: Normal Affect  Labs and imaging were reviewed. Ancillary Information   CBC    Component Value Date/Time   WBC 4.8 06/24/2023 0833   RBC 4.10 (L) 06/24/2023 0833   HGB 12.4 (L) 06/24/2023 0833   HGB 15.2 10/21/2019 1517   HCT 40.6 06/24/2023 0833   HCT 43.0 10/21/2019 1517   PLT 132 (L) 06/24/2023 0833   PLT 325 10/21/2019 1517   MCV 99.0 06/24/2023 0833   MCV 86 10/21/2019 1517   MCH 30.2 06/24/2023 0833   MCHC 30.5 06/24/2023 0833   RDW 12.2 06/24/2023 0833   RDW 12.3 10/21/2019 1517   LYMPHSABS 1.0 06/24/2023 0833   LYMPHSABS 1.3 10/21/2019 1517   MONOABS 0.6 06/24/2023 0833   EOSABS 0.3 06/24/2023 0833   EOSABS 0.0 10/21/2019 1517   BASOSABS 0.0 06/24/2023 0833   BASOSABS 0.1 10/21/2019 1517   Labs and imaging were reviewed.      No data to display  Assessment & Plan:  Cody [name] is a 67 year old pleasant gentleman with a past medical history of right upper lobe lung cancer with liver mets and spinal cord compression status post T1 T6 laminectomy and posterior fusion in 2021 at [facility], status post chemoradiation and currently maintained on Keytruda, COPD with significant panlobular emphysema complicated by chronic hypoxic respiratory failure on 4 L nasal cannula presenting today to the pulmonary clinic for further management of his COPD.  # Presumed COPD no PFTs on file with extensive panlobular emphysema. # Chronic hypoxic respiratory failure on 4 L nasal  cannula # Right upper lobe metastatic adenocarcinoma with liver and bone mets currently on Keytruda. # Right upper lobe postradiation fibrotic changes  He has not been on inhaler therapy for a while.  I think his shortness of breath is secondary to his underlying emphysema with COPD.  []  PFTs []  Start DuoNebs scheduled every 6 hours.  Favoring nebulizer treatment over inhaler as concern with drug delivery and weak inspiratory force. []  Referral placed for pulmonary rehab. []  Follows with Dr. Donneta Romberg - Last CT chest A/P 04/2023 stable disease  Return in about 3 months (around 09/30/2023) for with pfts.  I spent 60 minutes caring for this patient today, including preparing to see the patient, obtaining a medical history , reviewing a separately obtained history, performing a medically appropriate examination and/or evaluation, counseling and educating the patient/family/caregiver, ordering medications, tests, or procedures, documenting clinical information in the electronic health record, and independently interpreting results (not separately reported/billed) and communicating results to the patient/family/caregiver  Janann Colonel, MD [facility] 07/02/2023 4:25 PM

## 2023-07-03 ENCOUNTER — Encounter: Payer: Self-pay | Admitting: Internal Medicine

## 2023-07-03 DIAGNOSIS — J449 Chronic obstructive pulmonary disease, unspecified: Secondary | ICD-10-CM | POA: Diagnosis not present

## 2023-07-03 DIAGNOSIS — C7951 Secondary malignant neoplasm of bone: Secondary | ICD-10-CM | POA: Diagnosis not present

## 2023-07-03 DIAGNOSIS — C3490 Malignant neoplasm of unspecified part of unspecified bronchus or lung: Secondary | ICD-10-CM | POA: Diagnosis not present

## 2023-07-08 ENCOUNTER — Encounter: Payer: Self-pay | Admitting: *Deleted

## 2023-07-09 ENCOUNTER — Encounter: Payer: Self-pay | Admitting: Internal Medicine

## 2023-07-10 ENCOUNTER — Ambulatory Visit: Payer: Self-pay

## 2023-07-10 ENCOUNTER — Telehealth: Payer: Self-pay | Admitting: *Deleted

## 2023-07-10 ENCOUNTER — Other Ambulatory Visit: Payer: Self-pay | Admitting: Internal Medicine

## 2023-07-10 ENCOUNTER — Ambulatory Visit (INDEPENDENT_AMBULATORY_CARE_PROVIDER_SITE_OTHER): Payer: Medicare HMO | Admitting: Family Medicine

## 2023-07-10 ENCOUNTER — Other Ambulatory Visit: Payer: Self-pay | Admitting: Family Medicine

## 2023-07-10 VITALS — BP 110/70 | HR 92 | Resp 18 | Ht 69.0 in | Wt 118.3 lb

## 2023-07-10 DIAGNOSIS — D849 Immunodeficiency, unspecified: Secondary | ICD-10-CM

## 2023-07-10 DIAGNOSIS — B029 Zoster without complications: Secondary | ICD-10-CM | POA: Diagnosis not present

## 2023-07-10 MED ORDER — PREDNISONE 20 MG PO TABS: 20.0000 mg | ORAL_TABLET | Freq: Two times a day (BID) | ORAL | 0 refills | Status: DC

## 2023-07-10 MED ORDER — VALACYCLOVIR HCL 1 G PO TABS: 1000.0000 mg | ORAL_TABLET | Freq: Three times a day (TID) | ORAL | 0 refills | Status: AC

## 2023-07-10 NOTE — Progress Notes (Signed)
Name: Cody [name]   MRN: [id number]    DOB: 06-21-1956   Date:07/10/2023       Progress Note  Subjective  Chief Complaint  Chief Complaint  Patient presents with   Rash    Lower right hip onset for a few days itches and semi hurts. Red/blisters    HPI Discussed the use of AI scribe software for clinical note transcription with the patient, who gave verbal consent to proceed.  Discussed the use of AI scribe software for clinical note transcription with the patient, who gave verbal consent to proceed.  History of Present Illness   The patient, with a history of lung cancer and immunosuppression therapy, presented with a sudden onset of a large, painful rash a couple of nights ago. The rash, described as tingling, is not blistering and is localized to the right side of the body, extending from the spine to the buttocks. The patient denies any trauma or injury to the area prior to the onset of the rash. The patient also denies any associated fever or fatigue, but admits to feeling slightly more tired than usual.  The patient has a history of receiving the Zostavax vaccine for shingles in 2015, but has not received the newer Shingrix vaccine. The patient also received the flu shot and has had the pneumococcal vaccine.  The patient lives separately from his pregnant daughter, who he plans to visit in a week's time. The patient is currently on a break from his cancer therapy due to the shingles outbreak.         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) 11/29/2019   COPD with acute exacerbation (HCC) 11/29/2019   Cancer of  upper lobe of right lung (HCC) 11/23/2019   Metastatic cancer to spine (HCC) 11/01/2019   BPH with elevated PSA 07/16/2015   COPD bronchitis 07/16/2015   Seasonal and perennial allergic rhinitis 04/24/2010    Past Surgical History:  Procedure Laterality Date   HERNIA REPAIR Bilateral 1982   HERNIA REPAIR Right 1994   IR IMAGING GUIDED PORT INSERTION  02/24/2020   LAMINECTOMY FOR EXCISION / EVACUATION INTRASPINAL LESION  11/07/2019   T1-T 6 at [facility]     Family History  Problem Relation Age of Onset   Heart disease Father    CVA Mother    Lung cancer Sister    Lung cancer Brother     Social History   Tobacco Use   Smoking status: Former    Current packs/day: 0.00    Average packs/day: 2.0 packs/day for 30.0 years (60.0 ttl pk-yrs)    Types: Cigarettes    Start date: 07/28/1981    Quit date: 07/29/2011    Years since quitting: 11.9   Smokeless tobacco: Never   Tobacco comments:    pt vapes  Substance Use Topics   Alcohol use: Yes    Alcohol/week: 5.0 standard drinks of alcohol    Types: 5 Standard drinks or equivalent per week    Comment: 1-2/day     Current Outpatient Medications:    acetaminophen (TYLENOL) 500 MG tablet, Take 500-1,000 mg by mouth every 6 (six) hours as  needed for mild pain or fever. , Disp: , Rfl:    Calcium Carb-Cholecalciferol (OYSTER SHELL CALCIUM) 500-400 MG-UNIT TABS, Take 500 tablets by mouth., Disp: , Rfl:    escitalopram (LEXAPRO) 5 MG tablet, Take 1 tablet (5 mg total) by mouth every morning., Disp: 90 tablet, Rfl: 3   finasteride (PROSCAR) 5 MG tablet, TAKE 1 TABLET BY MOUTH DAILY, Disp: 90 tablet, Rfl: 1   ipratropium-albuterol (DUONEB) 0.5-2.5 (3) MG/3ML SOLN, Take 3 mLs by nebulization every 6 (six) hours., Disp: 360 mL, Rfl: 12   levothyroxine (SYNTHROID) 50 MCG tablet, TAKE 1 TABLET BY MOUTH DAILY BEFORE BREAKFAST, Disp: 90 tablet, Rfl: 2   Loratadine 10 MG CAPS, , Disp: , Rfl:    Multiple Vitamin (MULTIVITAMIN) tablet, Take 1 tablet by  mouth daily., Disp: , Rfl:    pravastatin (PRAVACHOL) 20 MG tablet, Take 1 tablet (20 mg total) by mouth daily., Disp: 90 tablet, Rfl: 3   QUEtiapine (SEROQUEL) 25 MG tablet, Take 1 tablet (25 mg total) by mouth at bedtime., Disp: 90 tablet, Rfl: 1   tamsulosin (FLOMAX) 0.4 MG CAPS capsule, TAKE 1 CAPSULE BY MOUTH DAILY, Disp: 90 capsule, Rfl: 1   valACYclovir (VALTREX) 1000 MG tablet, Take 1 tablet (1,000 mg total) by mouth 3 (three) times daily for 10 days., Disp: 30 tablet, Rfl: 0   albuterol (VENTOLIN HFA) 108 (90 Base) MCG/ACT inhaler, Inhale 2 puffs into the lungs every 4 (four) hours as needed for wheezing or shortness of breath. (Patient not taking: Reported on 07/10/2023), Disp: 18 g, Rfl: 0 No current facility-administered medications for this visit.  Facility-Administered Medications Ordered in Other Visits:    heparin lock flush 100 UNIT/ML injection, , , ,   No Known Allergies  I personally reviewed active problem list, medication list, allergies, family history with the patient/caregiver today.   ROS  Ten systems reviewed and is negative except as mentioned in HPI    Objective  Vitals:   07/10/23 1456  BP: 110/70  Pulse: 92  Resp: 18  SpO2: 94%  Weight: 118 lb 4.8 oz (53.7 kg)  Height: 5\' 9"  (1.753 m)    Body mass index is 17.47 kg/m.  Physical Exam  Constitutional: Patient appears well-developed, mild respiratory distress on oxygen  and malnourished  HEENT: head atraumatic and  normocephalic Cardiovascular: Normal rate, regular rhythm and normal heart sounds.  No murmur heard. No BLE edema. Pulmonary/Chest: in and expiratory wheezing  Skin: see attached photo, vesicles with erythematous base, covering right lower back and buttocks Psychiatric: Patient has a normal mood and affect. behavior is normal. Judgment and thought content normal.   Recent Results (from the past 2160 hours)  Comprehensive metabolic panel     Status: Abnormal   Collection Time:  04/22/23  8:38 AM  Result Value Ref Range   Sodium 136 135 - 145 mmol/L   Potassium 4.0 3.5 - 5.1 mmol/L   Chloride 90 (L) 98 - 111 mmol/L   CO2 40 (H) 22 - 32 mmol/L   Glucose, Bld 116 (H) 70 - 99 mg/dL    Comment: Glucose reference range applies only to samples taken after fasting for at least 8 hours.   BUN 12 8 - 23 mg/dL   Creatinine, Ser 5.28 (L) 0.61 - 1.24 mg/dL   Calcium 9.2 8.9 - 41.3 mg/dL   Total Protein 7.2 6.5 - 8.1 g/dL   Albumin 4.1 3.5 - 5.0 g/dL   AST 21 15 - 41 U/L   ALT  13 0 - 44 U/L   Alkaline Phosphatase 52 38 - 126 U/L   Total Bilirubin 0.5 0.3 - 1.2 mg/dL   GFR, Estimated >29 >52 mL/min    Comment: (NOTE) Calculated using the CKD-EPI Creatinine Equation (2021)    Anion gap 6 5 - 15    Comment: Performed at [facility], [address] [place], [place] [address]  CBC with Differential     Status: Abnormal   Collection Time: 04/22/23  8:38 AM  Result Value Ref Range   WBC 5.1 4.0 - 10.5 K/uL   RBC 4.16 (L) 4.22 - 5.81 MIL/uL   Hemoglobin 12.4 (L) 13.0 - 17.0 g/dL   HCT 44.0 10.2 - 72.5 %   MCV 97.8 80.0 - 100.0 fL   MCH 29.8 26.0 - 34.0 pg   MCHC 30.5 30.0 - 36.0 g/dL   RDW 36.6 44.0 - 34.7 %   Platelets 145 (L) 150 - 400 K/uL    Comment: SPECIMEN CHECKED FOR CLOTS   nRBC 0.0 0.0 - 0.2 %   Neutrophils Relative % 62 %   Neutro Abs 3.2 1.7 - 7.7 K/uL   Lymphocytes Relative 20 %   Lymphs Abs 1.0 0.7 - 4.0 K/uL   Monocytes Relative 10 %   Monocytes Absolute 0.5 0.1 - 1.0 K/uL   Eosinophils Relative 7 %   Eosinophils Absolute 0.4 0.0 - 0.5 K/uL   Basophils Relative 1 %   Basophils Absolute 0.0 0.0 - 0.1 K/uL   Immature Granulocytes 0 %   Abs Immature Granulocytes 0.02 0.00 - 0.07 K/uL    Comment: Performed at [facility], [address] [place], [place] [address]  CEA     Status: None   Collection Time: 04/22/23  8:38 AM  Result Value Ref Range   CEA 2.2 0.0 - 4.7 ng/mL    Comment: (NOTE)                             Nonsmokers           <3.9                             Smokers             <5.6 [employer] Electrochemiluminescence Immunoassay (ECLIA) Values obtained with different assay methods or kits cannot be used interchangeably.  Results cannot be interpreted as absolute evidence of the presence or absence of malignant disease. Performed At: [facility] [address] [place], [place] [id number] Jolene Schimke MD IR:[id number]   TSH     Status: None   Collection Time: 04/22/23  8:38 AM  Result Value Ref Range   TSH 3.733 0.350 - 4.500 uIU/mL    Comment: Performed by a 3rd Generation assay with a functional sensitivity of <=0.01 uIU/mL. Performed at [facility], [address] [place], [place] [address]   Lipid Profile     Status: Abnormal   Collection Time: 05/13/23  1:30 PM  Result Value Ref Range   Cholesterol, Total 229 (H) 100 - 199 mg/dL   Triglycerides 66 0 - 149 mg/dL   HDL 109 >32 mg/dL   VLDL Cholesterol Cal 11 5 - 40 mg/dL   LDL Chol Calc (NIH) 97 0 - 99 mg/dL   Chol/HDL Ratio 1.9 0.0 - 5.0 ratio    Comment:  T. Chol/HDL Ratio                                             Men  Women                               1/2 Avg.Risk  3.4    3.3                                   Avg.Risk  5.0    4.4                                2X Avg.Risk  9.6    7.1                                3X Avg.Risk 23.4   11.0   Comprehensive metabolic panel     Status: Abnormal   Collection Time: 05/13/23  2:34 PM  Result Value Ref Range   Sodium 138 135 - 145 mmol/L   Potassium 3.9 3.5 - 5.1 mmol/L   Chloride 89 (L) 98 - 111 mmol/L   CO2 41 (H) 22 - 32 mmol/L   Glucose, Bld 110 (H) 70 - 99 mg/dL    Comment: Glucose reference range applies only to samples taken after fasting for at least 8 hours.   BUN 10 8 - 23 mg/dL   Creatinine, Ser 1.61 (L) 0.61 - 1.24 mg/dL   Calcium 8.7 (L) 8.9 - 10.3 mg/dL   Total Protein 6.9 6.5 - 8.1 g/dL   Albumin 3.9 3.5 -  5.0 g/dL   AST 18 15 - 41 U/L   ALT 13 0 - 44 U/L   Alkaline Phosphatase 49 38 - 126 U/L   Total Bilirubin 0.5 0.3 - 1.2 mg/dL   GFR, Estimated >09 >60 mL/min    Comment: (NOTE) Calculated using the CKD-EPI Creatinine Equation (2021)    Anion gap 8 5 - 15    Comment: Performed at [facility], [address] [place], [place] [address]  CBC with Differential     Status: Abnormal   Collection Time: 05/13/23  2:34 PM  Result Value Ref Range   WBC 5.1 4.0 - 10.5 K/uL   RBC 3.94 (L) 4.22 - 5.81 MIL/uL   Hemoglobin 11.9 (L) 13.0 - 17.0 g/dL   HCT 81.1 (L) 91.4 - 78.2 %   MCV 98.2 80.0 - 100.0 fL   MCH 30.2 26.0 - 34.0 pg   MCHC 30.7 30.0 - 36.0 g/dL   RDW 95.6 21.3 - 08.6 %   Platelets 145 (L) 150 - 400 K/uL   nRBC 0.0 0.0 - 0.2 %   Neutrophils Relative % 73 %   Neutro Abs 3.7 1.7 - 7.7 K/uL   Lymphocytes Relative 14 %   Lymphs Abs 0.7 0.7 - 4.0 K/uL   Monocytes Relative 8 %   Monocytes Absolute 0.4 0.1 - 1.0 K/uL   Eosinophils Relative 4 %   Eosinophils Absolute 0.2 0.0 - 0.5 K/uL   Basophils Relative 1 %   Basophils Absolute 0.0 0.0 - 0.1 K/uL  Immature Granulocytes 0 %   Abs Immature Granulocytes 0.01 0.00 - 0.07 K/uL    Comment: Performed at [facility], [address] [place], [place] [address]  Thyroid Panel With TSH     Status: None   Collection Time: 05/13/23  2:34 PM  Result Value Ref Range   TSH 2.730 0.450 - 4.500 uIU/mL   T4, Total 8.8 4.5 - 12.0 ug/dL   T3 Uptake Ratio 25 24 - 39 %   Free Thyroxine Index 2.2 1.2 - 4.9    Comment: (NOTE) Performed At: [facility] [address] [place], [place] [id number] Jolene Schimke MD ZH:[id number]   CEA     Status: None   Collection Time: 05/13/23  2:34 PM  Result Value Ref Range   CEA 2.0 0.0 - 4.7 ng/mL    Comment: (NOTE)                             Nonsmokers          <3.9                             Smokers             <5.6 [employer] Electrochemiluminescence  Immunoassay (ECLIA) Values obtained with different assay methods or kits cannot be used interchangeably.  Results cannot be interpreted as absolute evidence of the presence or absence of malignant disease. Performed At: [facility] [address] [place], [place] [id number] Jolene Schimke MD [id number]   CBC with Differential     Status: Abnormal   Collection Time: 06/03/23  2:24 PM  Result Value Ref Range   WBC 5.6 4.0 - 10.5 K/uL   RBC 4.06 (L) 4.22 - 5.81 MIL/uL   Hemoglobin 12.0 (L) 13.0 - 17.0 g/dL   HCT 44.0 34.7 - 42.5 %   MCV 98.8 80.0 - 100.0 fL   MCH 29.6 26.0 - 34.0 pg   MCHC 29.9 (L) 30.0 - 36.0 g/dL   RDW 95.6 38.7 - 56.4 %   Platelets 147 (L) 150 - 400 K/uL   nRBC 0.0 0.0 - 0.2 %   Neutrophils Relative % 65 %   Neutro Abs 3.6 1.7 - 7.7 K/uL   Lymphocytes Relative 20 %   Lymphs Abs 1.1 0.7 - 4.0 K/uL   Monocytes Relative 9 %   Monocytes Absolute 0.5 0.1 - 1.0 K/uL   Eosinophils Relative 5 %   Eosinophils Absolute 0.3 0.0 - 0.5 K/uL   Basophils Relative 1 %   Basophils Absolute 0.0 0.0 - 0.1 K/uL   Immature Granulocytes 0 %   Abs Immature Granulocytes 0.02 0.00 - 0.07 K/uL    Comment: Performed at [facility], [address] [place], [place] [address]  Comprehensive metabolic panel     Status: Abnormal   Collection Time: 06/03/23  2:24 PM  Result Value Ref Range   Sodium 137 135 - 145 mmol/L   Potassium 4.3 3.5 - 5.1 mmol/L   Chloride 86 (L) 98 - 111 mmol/L   CO2 43 (H) 22 - 32 mmol/L   Glucose, Bld 104 (H) 70 - 99 mg/dL    Comment: Glucose reference range applies only to samples taken after fasting for at least 8 hours.   BUN 21 8 - 23 mg/dL   Creatinine, Ser 1.88 0.61 - 1.24 mg/dL   Calcium 9.4 8.9 -  10.3 mg/dL   Total Protein 7.1 6.5 - 8.1 g/dL   Albumin 4.2 3.5 - 5.0 g/dL   AST 19 15 - 41 U/L   ALT 14 0 - 44 U/L   Alkaline Phosphatase 43 38 - 126 U/L   Total Bilirubin 0.4 <1.2 mg/dL   GFR, Estimated >91 >47 mL/min    Comment:  (NOTE) Calculated using the CKD-EPI Creatinine Equation (2021)    Anion gap 8 5 - 15    Comment: Performed at [facility], [address] [place], [place] [address]  Comprehensive metabolic panel     Status: Abnormal   Collection Time: 06/24/23  8:33 AM  Result Value Ref Range   Sodium 139 135 - 145 mmol/L   Potassium 4.0 3.5 - 5.1 mmol/L   Chloride 91 (L) 98 - 111 mmol/L   CO2 39 (H) 22 - 32 mmol/L   Glucose, Bld 106 (H) 70 - 99 mg/dL    Comment: Glucose reference range applies only to samples taken after fasting for at least 8 hours.   BUN 15 8 - 23 mg/dL   Creatinine, Ser 2.13 (L) 0.61 - 1.24 mg/dL   Calcium 8.9 8.9 - 08.6 mg/dL   Total Protein 7.0 6.5 - 8.1 g/dL   Albumin 4.1 3.5 - 5.0 g/dL   AST 19 15 - 41 U/L   ALT 13 0 - 44 U/L   Alkaline Phosphatase 51 38 - 126 U/L   Total Bilirubin 0.8 <1.2 mg/dL   GFR, Estimated >57 >84 mL/min    Comment: (NOTE) Calculated using the CKD-EPI Creatinine Equation (2021)    Anion gap 9 5 - 15    Comment: Performed at [facility], [address] [place], [place] [address]  CBC with Differential     Status: Abnormal   Collection Time: 06/24/23  8:33 AM  Result Value Ref Range   WBC 4.8 4.0 - 10.5 K/uL   RBC 4.10 (L) 4.22 - 5.81 MIL/uL   Hemoglobin 12.4 (L) 13.0 - 17.0 g/dL   HCT 52.8 41.3 - 24.4 %   MCV 99.0 80.0 - 100.0 fL   MCH 30.2 26.0 - 34.0 pg   MCHC 30.5 30.0 - 36.0 g/dL   RDW 01.0 27.2 - 53.6 %   Platelets 132 (L) 150 - 400 K/uL   nRBC 0.0 0.0 - 0.2 %   Neutrophils Relative % 60 %   Neutro Abs 2.9 1.7 - 7.7 K/uL   Lymphocytes Relative 21 %   Lymphs Abs 1.0 0.7 - 4.0 K/uL   Monocytes Relative 12 %   Monocytes Absolute 0.6 0.1 - 1.0 K/uL   Eosinophils Relative 6 %   Eosinophils Absolute 0.3 0.0 - 0.5 K/uL   Basophils Relative 1 %   Basophils Absolute 0.0 0.0 - 0.1 K/uL   Immature Granulocytes 0 %   Abs Immature Granulocytes 0.01 0.00 - 0.07 K/uL    Comment: Performed at [facility], [address] [place], [place] [address]  CEA     Status: None   Collection Time: 06/24/23  8:33 AM  Result Value Ref Range   CEA 2.4 0.0 - 4.7 ng/mL    Comment: (NOTE)                             Nonsmokers          <3.9  Smokers             <5.6 [employer] Electrochemiluminescence Immunoassay (ECLIA) Values obtained with different assay methods or kits cannot be used interchangeably.  Results cannot be interpreted as absolute evidence of the presence or absence of malignant disease. Performed At: [facility] [address] [place], [place] [id number] Jolene Schimke MD [id number]     PHQ2/9:    05/21/2023    9:15 AM 11/19/2022    9:24 AM 01/14/2021   10:43 AM 08/27/2020    9:48 AM 07/10/2020    2:07 PM  Depression screen PHQ 2/9  Decreased Interest 0 0 0 0 0  Down, Depressed, Hopeless 1 0 1 0 0  PHQ - 2 Score 1 0 1 0 0  Altered sleeping 0 0   1  Tired, decreased energy 0 0 1  1  Change in appetite 0 0 0  1  Feeling bad or failure about yourself  0 0 1  0  Trouble concentrating 0 0 0  0  Moving slowly or fidgety/restless 0 0 0  0  Suicidal thoughts 0 0 0  0  PHQ-9 Score 1 0   3  Difficult doing work/chores     Not difficult at all    phq 9 is negative   Fall Risk:    05/21/2023    9:15 AM 11/19/2022    9:24 AM 01/14/2021   10:43 AM 08/27/2020    9:48 AM 07/10/2020    2:04 PM  Fall Risk   Falls in the past year? 0 0 0 0 0  Number falls in past yr: 0 0 0 0 0  Injury with Fall? 0 0 0 0 0  Risk for fall due to : No Fall Risks No Fall Risks   Impaired mobility  Follow up Falls prevention discussed Falls prevention discussed       Assessment & Plan     Herpes Zoster (Shingles) Large rash with tingling pain, likely in the dermatomal distribution. No complications noted at this time. Patient is immunocompromised due to cancer therapy. -Start Valtrex 1g TID for 10 days. -Consider Prednisone 20mg  TID for 5 days, pending approval  from oncologist.  Immunization Status Patient received Zostavax in the past, not Shingrix. Up to date on flu shot. Needs PCV20 and Tetanus vaccines. -Plan to administer PCV20 and Tetanus vaccines in 3 weeks, after shingles rash has improved.  Lung Cancer Patient is currently on Keytruda. Noted wheezing during visit. Patient recently started a new nebulizer medication. -Continue current treatment plan. Monitor response to new nebulizer medication.  General Health Maintenance -Advise patient to avoid contact with pregnant women and immunocompromised individuals until shingles rash has crusted over. -Plan to reassess need for Shingrix vaccine in 12 months.

## 2023-07-10 NOTE — Telephone Encounter (Signed)
Summary: Shingles   Pt is calling in requesting to speak with a nurse because he believes he has Shingles and wants more information and advice.     Chief Complaint: Blister rash to hid, thinks it is shingles. Symptoms: Above Frequency: Yesterday Pertinent Negatives: Patient denies fever Disposition: [] ED /[] Urgent Care (no appt availability in office) / [x] Appointment(In office/virtual)/ []  [facility]/ [] Home Care/ [] Refused Recommended Disposition /[] [facility]/ []  Follow-up with PCP Additional Notes: Agrees with appointment.  Reason for Disposition  [1] Looks infected (spreading redness, pus) AND [2] diabetes mellitus or weak immune system (e.g., HIV positive, cancer chemo, splenectomy, organ transplant, chronic steroids)  Answer Assessment - Initial Assessment Questions 1. APPEARANCE of RASH: "Describe the rash."      Blisters 2. LOCATION: "Where is the rash located?"      Right hip 3. NUMBER: "How many spots are there?"      Unsure 4. SIZE: "How big are the spots?" (Inches, centimeters or compare to size of a coin)      Eraser 5. ONSET: "When did the rash start?"      Yesterday 6. ITCHING: "Does the rash itch?" If Yes, ask: "How bad is the itch?"  (Scale 0-10; or none, mild, moderate, severe)     Mild 7. PAIN: "Does the rash hurt?" If Yes, ask: "How bad is the pain?"  (Scale 0-10; or none, mild, moderate, severe)    - NONE (0): no pain    - MILD (1-3): doesn't interfere with normal activities     - MODERATE (4-7): interferes with normal activities or awakens from sleep     - SEVERE (8-10): excruciating pain, unable to do any normal activities     Moderate 8. OTHER SYMPTOMS: "Do you have any other symptoms?" (e.g., fever)     No 9. PREGNANCY: "Is there any chance you are pregnant?" "When was your last menstrual period?"     N/a  Protocols used: Rash or Redness - Localized-A-AH

## 2023-07-10 NOTE — Telephone Encounter (Signed)
Patient called reporting that he has shingles and is asking if needs to cancel his appointment 12/17. Please advise

## 2023-07-10 NOTE — Telephone Encounter (Signed)
Attempted to reach patient to discuss his concerns about the possibility of "shingles." Unable to reach patient.

## 2023-07-10 NOTE — Progress Notes (Signed)
Given shingles, Will plan to move his appt to th first week of Jan 2025.  GB

## 2023-07-14 ENCOUNTER — Inpatient Hospital Stay: Payer: Medicare HMO

## 2023-07-14 ENCOUNTER — Inpatient Hospital Stay: Payer: Medicare HMO | Admitting: Internal Medicine

## 2023-08-02 DIAGNOSIS — J441 Chronic obstructive pulmonary disease with (acute) exacerbation: Secondary | ICD-10-CM | POA: Diagnosis not present

## 2023-08-02 DIAGNOSIS — J9601 Acute respiratory failure with hypoxia: Secondary | ICD-10-CM | POA: Diagnosis not present

## 2023-08-04 ENCOUNTER — Encounter: Payer: Self-pay | Admitting: Internal Medicine

## 2023-08-04 ENCOUNTER — Inpatient Hospital Stay: Payer: Medicare HMO

## 2023-08-04 ENCOUNTER — Inpatient Hospital Stay: Payer: Medicare HMO | Attending: Internal Medicine

## 2023-08-04 ENCOUNTER — Inpatient Hospital Stay: Payer: Medicare HMO | Admitting: Internal Medicine

## 2023-08-04 ENCOUNTER — Other Ambulatory Visit: Payer: Self-pay

## 2023-08-04 VITALS — BP 140/78 | HR 96 | Temp 96.4°F | Ht 69.0 in | Wt 120.2 lb

## 2023-08-04 VITALS — BP 126/80 | HR 84

## 2023-08-04 DIAGNOSIS — R0602 Shortness of breath: Secondary | ICD-10-CM | POA: Diagnosis not present

## 2023-08-04 DIAGNOSIS — Z79890 Hormone replacement therapy (postmenopausal): Secondary | ICD-10-CM | POA: Insufficient documentation

## 2023-08-04 DIAGNOSIS — E039 Hypothyroidism, unspecified: Secondary | ICD-10-CM | POA: Diagnosis not present

## 2023-08-04 DIAGNOSIS — M2550 Pain in unspecified joint: Secondary | ICD-10-CM | POA: Insufficient documentation

## 2023-08-04 DIAGNOSIS — R54 Age-related physical debility: Secondary | ICD-10-CM | POA: Diagnosis not present

## 2023-08-04 DIAGNOSIS — C3411 Malignant neoplasm of upper lobe, right bronchus or lung: Secondary | ICD-10-CM

## 2023-08-04 DIAGNOSIS — C7951 Secondary malignant neoplasm of bone: Secondary | ICD-10-CM | POA: Diagnosis not present

## 2023-08-04 DIAGNOSIS — J449 Chronic obstructive pulmonary disease, unspecified: Secondary | ICD-10-CM | POA: Diagnosis not present

## 2023-08-04 DIAGNOSIS — Z8249 Family history of ischemic heart disease and other diseases of the circulatory system: Secondary | ICD-10-CM | POA: Insufficient documentation

## 2023-08-04 DIAGNOSIS — Z7189 Other specified counseling: Secondary | ICD-10-CM | POA: Diagnosis not present

## 2023-08-04 DIAGNOSIS — N400 Enlarged prostate without lower urinary tract symptoms: Secondary | ICD-10-CM | POA: Diagnosis not present

## 2023-08-04 DIAGNOSIS — Z801 Family history of malignant neoplasm of trachea, bronchus and lung: Secondary | ICD-10-CM | POA: Diagnosis not present

## 2023-08-04 DIAGNOSIS — Z923 Personal history of irradiation: Secondary | ICD-10-CM | POA: Insufficient documentation

## 2023-08-04 DIAGNOSIS — Z823 Family history of stroke: Secondary | ICD-10-CM | POA: Diagnosis not present

## 2023-08-04 DIAGNOSIS — M546 Pain in thoracic spine: Secondary | ICD-10-CM | POA: Insufficient documentation

## 2023-08-04 DIAGNOSIS — R5383 Other fatigue: Secondary | ICD-10-CM | POA: Diagnosis not present

## 2023-08-04 DIAGNOSIS — F17210 Nicotine dependence, cigarettes, uncomplicated: Secondary | ICD-10-CM | POA: Diagnosis not present

## 2023-08-04 DIAGNOSIS — Z79899 Other long term (current) drug therapy: Secondary | ICD-10-CM | POA: Insufficient documentation

## 2023-08-04 DIAGNOSIS — C787 Secondary malignant neoplasm of liver and intrahepatic bile duct: Secondary | ICD-10-CM | POA: Insufficient documentation

## 2023-08-04 DIAGNOSIS — Z79620 Long term (current) use of immunosuppressive biologic: Secondary | ICD-10-CM | POA: Diagnosis not present

## 2023-08-04 DIAGNOSIS — Z5112 Encounter for antineoplastic immunotherapy: Secondary | ICD-10-CM | POA: Insufficient documentation

## 2023-08-04 DIAGNOSIS — Z9981 Dependence on supplemental oxygen: Secondary | ICD-10-CM | POA: Diagnosis not present

## 2023-08-04 DIAGNOSIS — Z7289 Other problems related to lifestyle: Secondary | ICD-10-CM | POA: Insufficient documentation

## 2023-08-04 LAB — CBC WITH DIFFERENTIAL/PLATELET
Abs Immature Granulocytes: 0.01 10*3/uL (ref 0.00–0.07)
Basophils Absolute: 0 10*3/uL (ref 0.0–0.1)
Basophils Relative: 0 %
Eosinophils Absolute: 0.2 10*3/uL (ref 0.0–0.5)
Eosinophils Relative: 3 %
HCT: 41.3 % (ref 39.0–52.0)
Hemoglobin: 12.7 g/dL — ABNORMAL LOW (ref 13.0–17.0)
Immature Granulocytes: 0 %
Lymphocytes Relative: 21 %
Lymphs Abs: 1.1 10*3/uL (ref 0.7–4.0)
MCH: 30.1 pg (ref 26.0–34.0)
MCHC: 30.8 g/dL (ref 30.0–36.0)
MCV: 97.9 fL (ref 80.0–100.0)
Monocytes Absolute: 0.4 10*3/uL (ref 0.1–1.0)
Monocytes Relative: 8 %
Neutro Abs: 3.6 10*3/uL (ref 1.7–7.7)
Neutrophils Relative %: 68 %
Platelets: 128 10*3/uL — ABNORMAL LOW (ref 150–400)
RBC: 4.22 MIL/uL (ref 4.22–5.81)
RDW: 12.9 % (ref 11.5–15.5)
WBC: 5.3 10*3/uL (ref 4.0–10.5)
nRBC: 0 % (ref 0.0–0.2)

## 2023-08-04 LAB — COMPREHENSIVE METABOLIC PANEL
ALT: 14 U/L (ref 0–44)
AST: 21 U/L (ref 15–41)
Albumin: 4.1 g/dL (ref 3.5–5.0)
Alkaline Phosphatase: 58 U/L (ref 38–126)
Anion gap: 13 (ref 5–15)
BUN: 12 mg/dL (ref 8–23)
CO2: 40 mmol/L — ABNORMAL HIGH (ref 22–32)
Calcium: 9.3 mg/dL (ref 8.9–10.3)
Chloride: 85 mmol/L — ABNORMAL LOW (ref 98–111)
Creatinine, Ser: 0.53 mg/dL — ABNORMAL LOW (ref 0.61–1.24)
GFR, Estimated: >60 mL/min (ref >60)
Glucose, Bld: 125 mg/dL — ABNORMAL HIGH (ref 70–99)
Potassium: 4 mmol/L (ref 3.5–5.1)
Sodium: 138 mmol/L (ref 135–145)
Total Bilirubin: 0.6 mg/dL (ref 0.0–1.2)
Total Protein: 7.2 g/dL (ref 6.5–8.1)

## 2023-08-04 LAB — IRON AND TIBC
Iron: 121 ug/dL (ref 45–182)
Saturation Ratios: 29 % (ref 17.9–39.5)
TIBC: 417 ug/dL (ref 250–450)
UIBC: 296 ug/dL

## 2023-08-04 LAB — FERRITIN: Ferritin: 34 ng/mL (ref 24–336)

## 2023-08-04 MED ORDER — HEPARIN SOD (PORK) LOCK FLUSH 100 UNIT/ML IV SOLN
500.0000 [IU] | Freq: Once | INTRAVENOUS | Status: AC | PRN
  Administered 2023-08-04: 500 [IU]
  Filled 2023-08-04: qty 5

## 2023-08-04 MED ORDER — AREXVY 120 MCG/0.5ML IM SUSR
0.5000 mL | Freq: Once | INTRAMUSCULAR | 0 refills | Status: AC
  Filled 2023-08-04: qty 0.5, 1d supply, fill #0

## 2023-08-04 MED ORDER — HEPARIN SOD (PORK) LOCK FLUSH 100 UNIT/ML IV SOLN
250.0000 [IU] | Freq: Once | INTRAVENOUS | Status: DC | PRN
  Filled 2023-08-04: qty 5

## 2023-08-04 MED ORDER — SODIUM CHLORIDE 0.9 % IV SOLN
Freq: Once | INTRAVENOUS | Status: AC
  Filled 2023-08-04: qty 250

## 2023-08-04 MED ORDER — PEMBROLIZUMAB CHEMO INJECTION 100 MG/4ML
200.0000 mg | Freq: Once | INTRAVENOUS | Status: AC
  Administered 2023-08-04: 200 mg via INTRAVENOUS
  Filled 2023-08-04: qty 8

## 2023-08-04 NOTE — Progress Notes (Signed)
  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   # 2021- MAY 26th-Keytrda x2 cycles; [borderline PS] July 7th-carbo Alimta  Keytruda  cycle #1  #November 2022-oligometastatic progression-right upper lobe subpleural involvement with; s/p radiation finished December 15.  Temporarily held Keytruda .  #Jan fourth 2023-restarted Keytruda .  # NGS/MOLECULAR TESTS:   # PALLIATIVE CARE EVALUATION:  # PAIN MANAGEMENT:    DIAGNOSIS:   STAGE:         ;  GOALS:  CURRENT/MOST RECENT THERAPY :     Cancer of upper lobe of right lung (HCC)  11/23/2019 Initial Diagnosis   Cancer of upper lobe of right lung (HCC)   12/16/2019 -  Chemotherapy   Patient is on Treatment Plan : LUNG Carboplatin  (5) + Pemetrexed  (500) + Pembrolizumab  (200) D1 q21d Induction x 4 cycles / Maintenance Pemetrexed  (500) + Pembrolizumab  (200) D1 q21d     12/21/2019 - 02/26/2022 Chemotherapy   Patient is on Treatment Plan : LUNG CARBOplatin  / Pemetrexed  / Pembrolizumab  q21d Induction x 4 cycles / Maintenance Pemetrexed  + Pembrolizumab       HISTORY OF PRESENTING ILLNESS: Thin built frail appearing male patient.  No acute distress. Alone.  Nasal cannula oxygen .  Ambulating independently.    Cody [name] 68 y.o.  male patient  with metastatic non-small cell lung cancer; cord compression status post decompressive surgery; currently on Keytruda  maintenance-is here for follow-up.   Complains of chronic mild mid back pain. No cough. Dyspnea as usual. On continuous oxygen . Low appetite, snacks and sandwich or soup. Bowels regular.      Review of Systems  Constitutional:  Positive for malaise/fatigue. Negative for chills, diaphoresis and fever.  HENT:  Negative for nosebleeds and sore throat.   Eyes:  Negative for double vision.  Respiratory:  Positive for shortness of breath. Negative for cough, hemoptysis, sputum production and wheezing.   Cardiovascular:  Negative for chest pain, palpitations, orthopnea and leg swelling.  Gastrointestinal:  Negative for abdominal pain, blood in stool, constipation, heartburn, melena, nausea and vomiting.  Genitourinary:  Negative for dysuria, frequency and urgency.  Musculoskeletal:  Positive for back pain and joint pain.  Skin: Negative.  Negative for itching and rash.  Neurological:  Negative for dizziness, tingling, focal weakness, weakness and headaches.  Endo/Heme/Allergies:  Does not bruise/bleed easily.  Psychiatric/Behavioral:  Negative for depression. The patient is not nervous/anxious and does not have insomnia.      MEDICAL HISTORY:  Past Medical History:  Diagnosis Date   Allergy    Cancer of upper lobe of right lung (HCC) 10/2019   COPD (chronic obstructive pulmonary disease) (HCC)    Prostate enlargement     SURGICAL HISTORY: Past Surgical History:  Procedure Laterality Date   HERNIA REPAIR Bilateral 1982   HERNIA REPAIR Right 1994   IR IMAGING GUIDED PORT INSERTION  02/24/2020   LAMINECTOMY FOR EXCISION / EVACUATION INTRASPINAL LESION  11/07/2019   T1-T 6 at [facility]     SOCIAL HISTORY: Social History   Socioeconomic History   Marital status: Married    Spouse name: [name]    Number of children: 4   Years of education: Not on file   Highest education  level: Some college, no degree  Occupational History   Occupation: dispatch and delivery   Tobacco Use   Smoking status: Former    Current packs/day: 0.00    Average packs/day: 2.0 packs/day for 30.0 years (60.0 ttl pk-yrs)    Types: Cigarettes    Start date: 07/28/1981    Quit date: 07/29/2011    Years since quitting: 12.0   Smokeless tobacco: Never   Tobacco comments:    pt vapes  Vaping Use   Vaping status: Not on file  Substance and Sexual Activity   Alcohol  use: Yes    Alcohol /week: 5.0 standard drinks of alcohol     Types: 5 Standard drinks or equivalent per week    Comment: 1-2/day   Drug use: No   Sexual activity: Not Currently    Partners: Female  Other Topics Concern   Not on file  Social History Narrative   Worked in warehouse/ quit smoking in 2015; beer 1/day; in [place]; with wife.    Social Drivers of Corporate Investment Banker Strain: Low Risk  (07/10/2023)   Overall Financial Resource Strain (CARDIA)    Difficulty of Paying Living Expenses: Not very hard  Food Insecurity: No Food Insecurity (07/10/2023)   Hunger Vital Sign    Worried About Running Out of Food in the Last Year: Never true    Ran Out of Food in the Last Year: Never true  Transportation Needs: No Transportation Needs (07/10/2023)   PRAPARE - Administrator, Civil Service (Medical): No    Lack of Transportation (Non-Medical): No  Physical Activity: Unknown (07/10/2023)   Exercise Vital Sign    Days of Exercise per Week: Patient declined    Minutes of Exercise per Session: Not on file  Stress: No Stress Concern Present (07/10/2023)   [employer] of Occupational Health - Occupational Stress Questionnaire    Feeling of Stress : Only a little  Social Connections: Socially Integrated (07/10/2023)   Social Connection and Isolation Panel [NHANES]    Frequency of Communication with Friends and Family: More than three times a week    Frequency of Social Gatherings with Friends  and Family: Once a week    Attends Religious Services: More than 4 times per year    Active Member of [employer] or Organizations: Yes    Attends Engineer, Structural: More than 4 times per year    Marital Status: Married  Catering Manager Violence: Not At Risk (10/21/2019)   Humiliation, Afraid, Rape, and Kick questionnaire    Fear of Current or Ex-Partner: No    Emotionally Abused: No    Physically Abused: No    Sexually Abused: No    FAMILY HISTORY: Family History  Problem Relation Age of Onset   Heart disease Father    CVA Mother    Lung cancer Sister    Lung cancer Brother     ALLERGIES:  has no known allergies.  MEDICATIONS:  Current Outpatient Medications  Medication Sig Dispense Refill   acetaminophen  (TYLENOL ) 500 MG tablet Take 500-1,000 mg by mouth every 6 (six) hours as needed for mild pain or fever.      Calcium Carb-Cholecalciferol (OYSTER SHELL CALCIUM) 500-400  MG-UNIT TABS Take 500 tablets by mouth.     escitalopram  (LEXAPRO ) 5 MG tablet Take 1 tablet (5 mg total) by mouth every morning. 90 tablet 3   finasteride  (PROSCAR ) 5 MG tablet TAKE 1 TABLET BY MOUTH DAILY 90 tablet 1   ipratropium-albuterol  (DUONEB) 0.5-2.5 (3) MG/3ML SOLN Take 3 mLs by nebulization every 6 (six) hours. 360 mL 12   levothyroxine  (SYNTHROID ) 50 MCG tablet TAKE 1 TABLET BY MOUTH DAILY BEFORE BREAKFAST 90 tablet 2   Loratadine  10 MG CAPS      Multiple Vitamin (MULTIVITAMIN) tablet Take 1 tablet by mouth daily.     pravastatin  (PRAVACHOL ) 20 MG tablet Take 1 tablet (20 mg total) by mouth daily. 90 tablet 3   QUEtiapine  (SEROQUEL ) 25 MG tablet Take 1 tablet (25 mg total) by mouth at bedtime. 90 tablet 1   RSV vaccine recomb adjuvanted (AREXVY ) 120 MCG/0.5ML injection Inject 0.5 mLs into the muscle once for 1 dose. 0.5 mL 0   tamsulosin  (FLOMAX ) 0.4 MG CAPS capsule TAKE 1 CAPSULE BY MOUTH DAILY 90 capsule 1   albuterol  (VENTOLIN  HFA) 108 (90 Base) MCG/ACT inhaler Inhale 2 puffs into the  lungs every 4 (four) hours as needed for wheezing or shortness of breath. (Patient not taking: Reported on 07/02/2023) 18 g 0   No current facility-administered medications for this visit.   Facility-Administered Medications Ordered in Other Visits  Medication Dose Route Frequency Provider Last Rate Last Admin   heparin  lock flush 100 UNIT/ML injection              PHYSICAL EXAMINATION: ECOG PERFORMANCE STATUS: 3 - Symptomatic, >50% confined to bed  Vitals:   08/04/23 1441  BP: (!) 140/78  Pulse: 96  Temp: (!) 96.4 F (35.8 C)  SpO2: 99%      Filed Weights   08/04/23 1441  Weight: 120 lb 3.2 oz (54.5 kg)    Physical Exam HENT:     Head: Normocephalic and atraumatic.     Mouth/Throat:     Pharynx: No oropharyngeal exudate.  Eyes:     Pupils: Pupils are equal, round, and reactive to light.  Cardiovascular:     Rate and Rhythm: Normal rate and regular rhythm.  Pulmonary:     Effort: No respiratory distress.     Breath sounds: No wheezing.     Comments: Decreased air entry bilaterally. Abdominal:     General: Bowel sounds are normal. There is no distension.     Palpations: Abdomen is soft. There is no mass.     Tenderness: There is no abdominal tenderness. There is no guarding or rebound.  Musculoskeletal:        General: No tenderness. Normal range of motion.     Cervical back: Normal range of motion and neck supple.  Skin:    General: Skin is warm.  Neurological:     Mental Status: He is alert and oriented to person, place, and time.  Psychiatric:        Mood and Affect: Affect normal.     LABORATORY DATA:  I have reviewed the data as listed Lab Results  Component Value Date   WBC 5.3 08/04/2023   HGB 12.7 (L) 08/04/2023   HCT 41.3 08/04/2023   MCV 97.9 08/04/2023   PLT 128 (L) 08/04/2023   Recent Labs    06/03/23 1424 06/24/23 0833 08/04/23 1426  NA 137 139 138  K 4.3 4.0 4.0  CL 86* 91* 85*  CO2 43* 39* 40*  GLUCOSE 104* 106* 125*  BUN 21  15 12   CREATININE 0.61 0.53* 0.53*  CALCIUM 9.4 8.9 9.3  GFRNONAA >60 >60 >60  PROT 7.1 7.0 7.2  ALBUMIN 4.2 4.1 4.1  AST 19 19 21   ALT 14 13 14   ALKPHOS 43 51 58  BILITOT 0.4 0.8 0.6    RADIOGRAPHIC STUDIES: I have personally reviewed the radiological images as listed and agreed with the findings in the report. No results found.   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   Oligo-metastatic progression-s/p RT right upper lobe mass [s/p dec 15th, 2022].  Currently on Keytruda  maintenance. OCT 28th, 2024-  Unchanged post treatment appearance of the right pulmonary apex with fibrotic scarring and volume loss.  Unchanged, sclerotic, expansile lesions of the posterior right third through fifth ribs;  Unchanged posterior bridging fusion of T1-T6 across sclerotic pathologic fractures of T3-T4;  No evidence of lymphadenopathy or soft tissue metastatic disease in the chest, abdomen, or pelvis.   # proceed with keytruda  maintainance today. Labs today reviewed;  acceptable for treatment today- stable. Will repeat imaging in Feb- March 2024.   # Bone metastases- [[facility], Dr.Parks]. Continue multivitamin with calcium plus vitamin D  3 times a day.  S/p extractions. jan 19th, 2024; s/p dental clearance [April 2024]. vit D 25 OH- march 2024- 70. Currently on Zometa  3 mg q 3 M. stable   # Hypothyroidism- [ Mid sep 2022]  Normal T3-T4.  Currently on 50 mcg a day; JUNE 2024- WNL- stable   # COPD-. On 2-3 lit O2; Trelegy.Clinically-stable however given ongoing severe COPD I think is reasonable to follow-up with pulmonary.  Will make a referral.  # prostatomegaly- s/p  Dr.Brandon re: prostate enlargement- on Flomax /Proscar  [Dr.Bradon]- PSA- 10.3-stable   # Flu shot [OCT 2024]; Pneumococcal- [2021; q 5-10 y]  HOLD ZOMETA - last  06/24/2023 q 5M;    # DISPOSITION: # Keytruda   today # follow up in 3 weeks-MD-  port/labs- cbc/cmp;CEA; thyroid  profile- Keytruda -Dr.B    All questions were answered. The patient knows to call the clinic with any problems, questions or concerns.    Cindy JONELLE Joe, MD 08/04/2023 3:17 PM

## 2023-08-04 NOTE — Patient Instructions (Signed)
 [facility] - A DEPT OF [facility]  Discharge Instructions: Thank you for choosing [facility] to provide your oncology and hematology care.  If you have a lab appointment with the Cancer Center, please go directly to the Cancer Center and check in at the registration area.  Wear comfortable clothing and clothing appropriate for easy access to any Portacath or PICC line.   We strive to give you quality time with your provider. You may need to reschedule your appointment if you arrive late (15 or more minutes).  Arriving late affects you and other patients whose appointments are after yours.  Also, if you miss three or more appointments without notifying the office, you may be dismissed from the clinic at the provider's discretion.      For prescription refill requests, have your pharmacy contact our office and allow 72 hours for refills to be completed.    Today you received the following chemotherapy and/or immunotherapy agents Keytruda        To help prevent nausea and vomiting after your treatment, we encourage you to take your nausea medication as directed.  BELOW ARE SYMPTOMS THAT SHOULD BE REPORTED IMMEDIATELY: *FEVER GREATER THAN 100.4 F (38 C) OR HIGHER *CHILLS OR SWEATING *NAUSEA AND VOMITING THAT IS NOT CONTROLLED WITH YOUR NAUSEA MEDICATION *UNUSUAL SHORTNESS OF BREATH *UNUSUAL BRUISING OR BLEEDING *URINARY PROBLEMS (pain or burning when urinating, or frequent urination) *BOWEL PROBLEMS (unusual diarrhea, constipation, pain near the anus) TENDERNESS IN MOUTH AND THROAT WITH OR WITHOUT PRESENCE OF ULCERS (sore throat, sores in mouth, or a toothache) UNUSUAL RASH, SWELLING OR PAIN  UNUSUAL VAGINAL DISCHARGE OR ITCHING   Items with * indicate a potential emergency and should be followed up as soon as possible or go to the Emergency Department if any problems should occur.  Please show the CHEMOTHERAPY ALERT CARD or IMMUNOTHERAPY  ALERT CARD at check-in to the Emergency Department and triage nurse.  Should you have questions after your visit or need to cancel or reschedule your appointment, please contact [facility] - A DEPT OF [facility]  [phone] and follow the prompts.  Office hours are 8:00 a.m. to 4:30 p.m. Monday - Friday. Please note that voicemails left after 4:00 p.m. may not be returned until the following business day.  We are closed weekends and major holidays. You have access to a nurse at all times for urgent questions. Please call the main number to the clinic [phone] and follow the prompts.  For any non-urgent questions, you may also contact your provider using MyChart. We now offer e-Visits for anyone 3 and older to request care online for non-urgent symptoms. For details visit [email or web address].   Also download the MyChart app! Go to the app store, search MyChart, open the app, select [facility], and log in with your MyChart username and password.

## 2023-08-04 NOTE — Progress Notes (Signed)
 Had RSV vaccine today. Last pneumonia was 2021, when can he get another?

## 2023-08-04 NOTE — Assessment & Plan Note (Addendum)
# [  april2021] Stage IV metastatic non-small cell lung cancer favor adeno; mets to bone; liver.   Oligo-metastatic progression-s/p RT right upper lobe mass [s/p dec 15th, 2022].  Currently on Keytruda  maintenance. OCT 28th, 2024-  Unchanged post treatment appearance of the right pulmonary apex with fibrotic scarring and volume loss.  Unchanged, sclerotic, expansile lesions of the posterior right third through fifth ribs;  Unchanged posterior bridging fusion of T1-T6 across sclerotic pathologic fractures of T3-T4;  No evidence of lymphadenopathy or soft tissue metastatic disease in the chest, abdomen, or pelvis.   # proceed with keytruda  maintainance today. Labs today reviewed;  acceptable for treatment today- stable. Will repeat imaging in Feb- March 2024.   # Bone metastases- [[facility], Dr.Parks]. Continue multivitamin with calcium plus vitamin D  3 times a day.  S/p extractions. jan 19th, 2024; s/p dental clearance [April 2024]. vit D 25 OH- march 2024- 68. Currently on Zometa  3 mg q 3 M. stable   # Hypothyroidism- [ Mid sep 2022]  Normal T3-T4.  Currently on 50 mcg a day; JUNE 2024- WNL- stable   # COPD-. On 2-3 lit O2; Trelegy.Clinically-stable however given ongoing severe COPD I think is reasonable to follow-up with pulmonary.  Will make a referral.  # prostatomegaly- s/p  Dr.Brandon re: prostate enlargement- on Flomax /Proscar  [Dr.Bradon]- PSA- 10.3-stable   # Flu shot [OCT 2024]; Pneumococcal- [2021; q 5-10 y]  HOLD ZOMETA - last  06/24/2023 q 68M;    # DISPOSITION: # Keytruda  today # follow up in 3 weeks-MD-  port/labs- cbc/cmp;CEA; thyroid  profile- Keytruda -Dr.B

## 2023-08-04 NOTE — Patient Instructions (Signed)

## 2023-08-05 ENCOUNTER — Other Ambulatory Visit: Payer: Medicare HMO

## 2023-08-05 ENCOUNTER — Ambulatory Visit: Payer: Medicare HMO | Admitting: Internal Medicine

## 2023-08-05 ENCOUNTER — Ambulatory Visit: Payer: Medicare HMO

## 2023-08-05 LAB — THYROID PANEL WITH TSH
Free Thyroxine Index: 2.3 (ref 1.2–4.9)
T3 Uptake Ratio: 26 % (ref 24–39)
T4, Total: 9 ug/dL (ref 4.5–12.0)
TSH: 3.34 u[IU]/mL (ref 0.450–4.500)

## 2023-08-05 LAB — CEA: CEA: 2.4 ng/mL (ref 0.0–4.7)

## 2023-08-11 ENCOUNTER — Telehealth: Payer: Self-pay | Admitting: Internal Medicine

## 2023-08-11 NOTE — Telephone Encounter (Signed)
 Called patient to advise of appointment time change- Left a voicemail with details to call back if he can not do time change.

## 2023-08-17 ENCOUNTER — Other Ambulatory Visit: Payer: Self-pay | Admitting: Internal Medicine

## 2023-08-18 ENCOUNTER — Telehealth: Payer: Self-pay | Admitting: Internal Medicine

## 2023-08-18 NOTE — Telephone Encounter (Signed)
Spoke to patient regarding denial of Keytruda by insurance.  Will discuss further at next visit.   Cancel the Keytruda infusion; keep other appts as planned-GB

## 2023-08-25 ENCOUNTER — Ambulatory Visit: Payer: Medicare HMO

## 2023-08-25 ENCOUNTER — Encounter: Payer: Self-pay | Admitting: Internal Medicine

## 2023-08-25 ENCOUNTER — Inpatient Hospital Stay (HOSPITAL_BASED_OUTPATIENT_CLINIC_OR_DEPARTMENT_OTHER): Payer: Medicare HMO | Admitting: Internal Medicine

## 2023-08-25 ENCOUNTER — Other Ambulatory Visit: Payer: Medicare HMO

## 2023-08-25 ENCOUNTER — Inpatient Hospital Stay: Payer: Medicare HMO

## 2023-08-25 ENCOUNTER — Ambulatory Visit: Payer: Medicare HMO | Admitting: Internal Medicine

## 2023-08-25 VITALS — BP 131/78 | HR 83 | Temp 96.4°F | Ht 69.0 in | Wt 121.4 lb

## 2023-08-25 DIAGNOSIS — Z8249 Family history of ischemic heart disease and other diseases of the circulatory system: Secondary | ICD-10-CM | POA: Diagnosis not present

## 2023-08-25 DIAGNOSIS — R0602 Shortness of breath: Secondary | ICD-10-CM | POA: Diagnosis not present

## 2023-08-25 DIAGNOSIS — C3411 Malignant neoplasm of upper lobe, right bronchus or lung: Secondary | ICD-10-CM

## 2023-08-25 DIAGNOSIS — R5383 Other fatigue: Secondary | ICD-10-CM | POA: Diagnosis not present

## 2023-08-25 DIAGNOSIS — C787 Secondary malignant neoplasm of liver and intrahepatic bile duct: Secondary | ICD-10-CM | POA: Diagnosis not present

## 2023-08-25 DIAGNOSIS — Z79890 Hormone replacement therapy (postmenopausal): Secondary | ICD-10-CM | POA: Diagnosis not present

## 2023-08-25 DIAGNOSIS — N400 Enlarged prostate without lower urinary tract symptoms: Secondary | ICD-10-CM | POA: Diagnosis not present

## 2023-08-25 DIAGNOSIS — M2550 Pain in unspecified joint: Secondary | ICD-10-CM | POA: Diagnosis not present

## 2023-08-25 DIAGNOSIS — Z9981 Dependence on supplemental oxygen: Secondary | ICD-10-CM | POA: Diagnosis not present

## 2023-08-25 DIAGNOSIS — F17210 Nicotine dependence, cigarettes, uncomplicated: Secondary | ICD-10-CM | POA: Diagnosis not present

## 2023-08-25 DIAGNOSIS — Z823 Family history of stroke: Secondary | ICD-10-CM | POA: Diagnosis not present

## 2023-08-25 DIAGNOSIS — Z79620 Long term (current) use of immunosuppressive biologic: Secondary | ICD-10-CM | POA: Diagnosis not present

## 2023-08-25 DIAGNOSIS — Z7289 Other problems related to lifestyle: Secondary | ICD-10-CM | POA: Diagnosis not present

## 2023-08-25 DIAGNOSIS — Z79899 Other long term (current) drug therapy: Secondary | ICD-10-CM | POA: Diagnosis not present

## 2023-08-25 DIAGNOSIS — R54 Age-related physical debility: Secondary | ICD-10-CM | POA: Diagnosis not present

## 2023-08-25 DIAGNOSIS — E039 Hypothyroidism, unspecified: Secondary | ICD-10-CM | POA: Diagnosis not present

## 2023-08-25 DIAGNOSIS — J449 Chronic obstructive pulmonary disease, unspecified: Secondary | ICD-10-CM | POA: Diagnosis not present

## 2023-08-25 DIAGNOSIS — Z923 Personal history of irradiation: Secondary | ICD-10-CM | POA: Diagnosis not present

## 2023-08-25 DIAGNOSIS — Z5112 Encounter for antineoplastic immunotherapy: Secondary | ICD-10-CM | POA: Diagnosis not present

## 2023-08-25 DIAGNOSIS — Z7189 Other specified counseling: Secondary | ICD-10-CM

## 2023-08-25 DIAGNOSIS — C7951 Secondary malignant neoplasm of bone: Secondary | ICD-10-CM | POA: Diagnosis not present

## 2023-08-25 DIAGNOSIS — Z801 Family history of malignant neoplasm of trachea, bronchus and lung: Secondary | ICD-10-CM | POA: Diagnosis not present

## 2023-08-25 DIAGNOSIS — Z95828 Presence of other vascular implants and grafts: Secondary | ICD-10-CM

## 2023-08-25 DIAGNOSIS — M546 Pain in thoracic spine: Secondary | ICD-10-CM | POA: Diagnosis not present

## 2023-08-25 LAB — CBC WITH DIFFERENTIAL/PLATELET
Abs Immature Granulocytes: 0.01 10*3/uL (ref 0.00–0.07)
Basophils Absolute: 0 10*3/uL (ref 0.0–0.1)
Basophils Relative: 1 %
Eosinophils Absolute: 0.3 10*3/uL (ref 0.0–0.5)
Eosinophils Relative: 5 %
HCT: 40.2 % (ref 39.0–52.0)
Hemoglobin: 12.4 g/dL — ABNORMAL LOW (ref 13.0–17.0)
Immature Granulocytes: 0 %
Lymphocytes Relative: 16 %
Lymphs Abs: 0.8 10*3/uL (ref 0.7–4.0)
MCH: 30.2 pg (ref 26.0–34.0)
MCHC: 30.8 g/dL (ref 30.0–36.0)
MCV: 98 fL (ref 80.0–100.0)
Monocytes Absolute: 0.5 10*3/uL (ref 0.1–1.0)
Monocytes Relative: 10 %
Neutro Abs: 3.5 10*3/uL (ref 1.7–7.7)
Neutrophils Relative %: 68 %
Platelets: 150 10*3/uL (ref 150–400)
RBC: 4.1 MIL/uL — ABNORMAL LOW (ref 4.22–5.81)
RDW: 12.7 % (ref 11.5–15.5)
WBC: 5.2 10*3/uL (ref 4.0–10.5)
nRBC: 0 % (ref 0.0–0.2)

## 2023-08-25 LAB — COMPREHENSIVE METABOLIC PANEL
ALT: 11 U/L (ref 0–44)
AST: 17 U/L (ref 15–41)
Albumin: 3.8 g/dL (ref 3.5–5.0)
Alkaline Phosphatase: 55 U/L (ref 38–126)
Anion gap: 11 (ref 5–15)
BUN: 11 mg/dL (ref 8–23)
CO2: 41 mmol/L — ABNORMAL HIGH (ref 22–32)
Calcium: 9.3 mg/dL (ref 8.9–10.3)
Chloride: 85 mmol/L — ABNORMAL LOW (ref 98–111)
Creatinine, Ser: 0.49 mg/dL — ABNORMAL LOW (ref 0.61–1.24)
GFR, Estimated: >60 mL/min (ref >60)
Glucose, Bld: 108 mg/dL — ABNORMAL HIGH (ref 70–99)
Potassium: 4.5 mmol/L (ref 3.5–5.1)
Sodium: 137 mmol/L (ref 135–145)
Total Bilirubin: 0.5 mg/dL (ref 0.0–1.2)
Total Protein: 6.8 g/dL (ref 6.5–8.1)

## 2023-08-25 MED ORDER — SODIUM CHLORIDE 0.9% FLUSH
10.0000 mL | INTRAVENOUS | Status: DC | PRN
  Administered 2023-08-25: 10 mL via INTRAVENOUS
  Filled 2023-08-25: qty 10

## 2023-08-25 MED ORDER — HEPARIN SOD (PORK) LOCK FLUSH 100 UNIT/ML IV SOLN
500.0000 [IU] | Freq: Once | INTRAVENOUS | Status: AC
  Administered 2023-08-25: 500 [IU] via INTRAVENOUS
  Filled 2023-08-25: qty 5

## 2023-08-25 NOTE — Progress Notes (Signed)
Fell 2 weeks ago carrying an object and tripped. Scratched up his knuckles.  On O2-4L. No worsening SOB.

## 2023-08-25 NOTE — Progress Notes (Signed)
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   # 2021- MAY 26th-Keytrda x2 cycles; [borderline PS] July 7th-carbo Alimta Keytruda cycle #1  #November 2022-oligometastatic progression-right upper lobe subpleural involvement with; s/p radiation finished December 15.  Temporarily held [place].  #Jan fourth 2023-restarted Keytruda.  # NGS/MOLECULAR TESTS:   # PALLIATIVE CARE EVALUATION:  # PAIN MANAGEMENT:       Cancer of upper lobe of right lung (HCC)  11/23/2019 Initial Diagnosis   Cancer of upper lobe of right lung (HCC)   12/16/2019 -  Chemotherapy   Patient is on Treatment Plan : LUNG Carboplatin (5) + Pemetrexed (500) + Pembrolizumab (200) D1 q21d Induction x 4 cycles / Maintenance Pemetrexed (500) + Pembrolizumab (200) D1 q21d     12/21/2019 - 02/26/2022 Chemotherapy   Patient is on Treatment Plan : LUNG CARBOplatin / Pemetrexed / Pembrolizumab q21d Induction x 4 cycles / Maintenance Pemetrexed + Pembrolizumab      HISTORY OF PRESENTING ILLNESS: Thin built frail appearing male patient.  No acute distress. With wife. Nasal cannula oxygen.  Ambulating independently.    Cody [name] 68 y.o.  male patient with metastatic non-small cell lung cancer; cord compression status post  decompressive surgery; currently on Keytruda maintenance-is here for follow-up.   Complains of chronic mild mid back pain. No cough. Dyspnea as usual. On continuous oxygen. Low appetite, snacks and sandwich or soup. Bowels regular.      Review of Systems  Constitutional:  Positive for malaise/fatigue. Negative for chills, diaphoresis and fever.  HENT:  Negative for nosebleeds and sore throat.   Eyes:  Negative for double vision.  Respiratory:  Positive for shortness of breath. Negative for cough, hemoptysis, sputum production and wheezing.   Cardiovascular:  Negative for chest pain, palpitations, orthopnea and leg swelling.  Gastrointestinal:  Negative for abdominal pain, blood in stool, constipation, heartburn, melena, nausea and vomiting.  Genitourinary:  Negative for dysuria, frequency and urgency.  Musculoskeletal:  Positive for back pain and joint pain.  Skin: Negative.  Negative for itching and rash.  Neurological:  Negative for dizziness, tingling, focal weakness, weakness and headaches.  Endo/Heme/Allergies:  Does not bruise/bleed easily.  Psychiatric/Behavioral:  Negative for depression. The patient is not nervous/anxious and does not have insomnia.      MEDICAL HISTORY:  Past Medical History:  Diagnosis Date   Allergy    Cancer of upper lobe of right lung (HCC) 10/2019   COPD (chronic obstructive pulmonary disease) (HCC)    Prostate enlargement     SURGICAL HISTORY: Past Surgical History:  Procedure Laterality Date   HERNIA REPAIR Bilateral 1982   HERNIA REPAIR Right 1994   IR IMAGING GUIDED PORT INSERTION  02/24/2020   LAMINECTOMY FOR EXCISION / EVACUATION INTRASPINAL LESION  11/07/2019   T1-T 6 at [facility]     SOCIAL HISTORY: Social History   Socioeconomic History  Marital status: Married    Spouse name: [name]    Number of children: 4   Years of education: Not on file   Highest education level: Some college, no degree  Occupational History   Occupation:  dispatch and delivery   Tobacco Use   Smoking status: Former    Current packs/day: 0.00    Average packs/day: 2.0 packs/day for 30.0 years (60.0 ttl pk-yrs)    Types: Cigarettes    Start date: 07/28/1981    Quit date: 07/29/2011    Years since quitting: 12.0   Smokeless tobacco: Never   Tobacco comments:    pt vapes  Vaping Use   Vaping status: Not on file  Substance and Sexual Activity   Alcohol use: Yes    Alcohol/week: 5.0 standard drinks of alcohol    Types: 5 Standard drinks or equivalent per week    Comment: 1-2/day   Drug use: No   Sexual activity: Not Currently    Partners: Female  Other Topics Concern   Not on file  Social History Narrative   Worked in warehouse/ quit smoking in 2015; beer 1/day; in [place]; with wife.    Social Drivers of Corporate investment banker Strain: Low Risk  (07/10/2023)   Overall Financial Resource Strain (CARDIA)    Difficulty of Paying Living Expenses: Not very hard  Food Insecurity: No Food Insecurity (07/10/2023)   Hunger Vital Sign    Worried About Running Out of Food in the Last Year: Never true    Ran Out of Food in the Last Year: Never true  Transportation Needs: No Transportation Needs (07/10/2023)   PRAPARE - Administrator, Civil Service (Medical): No    Lack of Transportation (Non-Medical): No  Physical Activity: Unknown (07/10/2023)   Exercise Vital Sign    Days of Exercise per Week: Patient declined    Minutes of Exercise per Session: Not on file  Stress: No Stress Concern Present (07/10/2023)   [employer] of Occupational Health - Occupational Stress Questionnaire    Feeling of Stress : Only a little  Social Connections: Socially Integrated (07/10/2023)   Social Connection and Isolation Panel [NHANES]    Frequency of Communication with Friends and Family: More than three times a week    Frequency of Social Gatherings with Friends and Family: Once a week    Attends Religious Services: More than 4  times per year    Active Member of [employer] or Organizations: Yes    Attends Engineer, structural: More than 4 times per year    Marital Status: Married  Catering manager Violence: Not At Risk (10/21/2019)   Humiliation, Afraid, Rape, and Kick questionnaire    Fear of Current or Ex-Partner: No    Emotionally Abused: No    Physically Abused: No    Sexually Abused: No    FAMILY HISTORY: Family History  Problem Relation Age of Onset   Heart disease Father    CVA Mother    Lung cancer Sister    Lung cancer Brother     ALLERGIES:  has no known allergies.  MEDICATIONS:  Current Outpatient Medications  Medication Sig Dispense Refill   acetaminophen (TYLENOL) 500 MG tablet Take 500-1,000 mg by mouth every 6 (six) hours as needed for mild pain or fever.      Calcium Carb-Cholecalciferol (OYSTER SHELL CALCIUM) 500-400 MG-UNIT TABS Take 500 tablets by mouth.     escitalopram (LEXAPRO) 5 MG tablet Take 1 tablet (5 mg  total) by mouth every morning. 90 tablet 3   finasteride (PROSCAR) 5 MG tablet TAKE 1 TABLET BY MOUTH DAILY 90 tablet 1   ipratropium-albuterol (DUONEB) 0.5-2.5 (3) MG/3ML SOLN Take 3 mLs by nebulization every 6 (six) hours. 360 mL 12   levothyroxine (SYNTHROID) 50 MCG tablet TAKE 1 TABLET BY MOUTH DAILY BEFORE BREAKFAST 90 tablet 2   Loratadine 10 MG CAPS      Multiple Vitamin (MULTIVITAMIN) tablet Take 1 tablet by mouth daily.     pravastatin (PRAVACHOL) 20 MG tablet Take 1 tablet (20 mg total) by mouth daily. 90 tablet 3   QUEtiapine (SEROQUEL) 25 MG tablet Take 1 tablet (25 mg total) by mouth at bedtime. 90 tablet 1   tamsulosin (FLOMAX) 0.4 MG CAPS capsule TAKE 1 CAPSULE BY MOUTH DAILY 90 capsule 1   albuterol (VENTOLIN HFA) 108 (90 Base) MCG/ACT inhaler Inhale 2 puffs into the lungs every 4 (four) hours as needed for wheezing or shortness of breath. (Patient not taking: Reported on 08/25/2023) 18 g 0   No current facility-administered medications for this visit.    Facility-Administered Medications Ordered in Other Visits  Medication Dose Route Frequency Provider Last Rate Last Admin   heparin lock flush 100 UNIT/ML injection              PHYSICAL EXAMINATION: ECOG PERFORMANCE STATUS: 3 - Symptomatic, >50% confined to bed  Vitals:   08/25/23 1316  BP: 131/78  Pulse: 83  Temp: (!) 96.4 F (35.8 C)  SpO2: 100%       Filed Weights   08/25/23 1316  Weight: 121 lb 6.4 oz (55.1 kg)     Physical Exam HENT:     Head: Normocephalic and atraumatic.     Mouth/Throat:     Pharynx: No oropharyngeal exudate.  Eyes:     Pupils: Pupils are equal, round, and reactive to light.  Cardiovascular:     Rate and Rhythm: Normal rate and regular rhythm.  Pulmonary:     Effort: No respiratory distress.     Breath sounds: No wheezing.     Comments: Decreased air entry bilaterally. Abdominal:     General: Bowel sounds are normal. There is no distension.     Palpations: Abdomen is soft. There is no mass.     Tenderness: There is no abdominal tenderness. There is no guarding or rebound.  Musculoskeletal:        General: No tenderness. Normal range of motion.     Cervical back: Normal range of motion and neck supple.  Skin:    General: Skin is warm.  Neurological:     Mental Status: He is alert and oriented to person, place, and time.  Psychiatric:        Mood and Affect: Affect normal.     LABORATORY DATA:  I have reviewed the data as listed Lab Results  Component Value Date   WBC 5.2 08/25/2023   HGB 12.4 (L) 08/25/2023   HCT 40.2 08/25/2023   MCV 98.0 08/25/2023   PLT 150 08/25/2023   Recent Labs    06/24/23 0833 08/04/23 1426 08/25/23 1310  NA 139 138 137  K 4.0 4.0 4.5  CL 91* 85* 85*  CO2 39* 40* 41*  GLUCOSE 106* 125* 108*  BUN 15 12 11   CREATININE 0.53* 0.53* 0.49*  CALCIUM 8.9 9.3 9.3  GFRNONAA >60 >60 >60  PROT 7.0 7.2 6.8  ALBUMIN 4.1 4.1 3.8  AST 19 21 17   ALT 13 14  11  ALKPHOS 51 58 55  BILITOT 0.8 0.6 0.5     RADIOGRAPHIC STUDIES: I have personally reviewed the radiological images as listed and agreed with the findings in the report. No results found.   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   Oligo-metastatic progression-s/p RT right upper lobe mass [s/p dec 15th, 2022].  Currently on Keytruda maintenance. OCT 28th, 2024-  Unchanged post treatment appearance of the right pulmonary apex with fibrotic scarring and volume loss.  Unchanged, sclerotic, expansile lesions of the posterior right third through fifth ribs;  Unchanged posterior bridging fusion of T1-T6 across sclerotic pathologic fractures of T3-T4;  No evidence of lymphadenopathy or soft tissue metastatic disease in the chest, abdomen, or pelvis.   # HOLD keytruda maintainance today as patient is currently > 2 years of treatment.  I reviewed the rationale with the patient and wife of holding [place].  Will repeat imaging in 1 month-if no active disease we will continue surveillance every 3 months scans.  Labs today reviewed- stable.  # Bone metastases- [[facility], Dr.Parks]. Continue multivitamin with calcium plus vitamin D 3 times a day.  S/p extractions. jan 19th, 2024; s/p dental clearance [April 2024]. vit D 25 [place]- march 2024- 70. Currently on Zometa 3 mg q 3 M. stable   # Hypothyroidism- [ Mid sep 2022]  Normal T3-T4.  Currently on 50 mcg a day; JUNE 2024- WNL- stable   # COPD-. On 2-3 lit O2; Trelegy [s/p [facility] pul]- stable   # prostatomegaly- s/p  Dr.Brandon re: prostate enlargement- on Flomax/Proscar [Dr.Bradon]- PSA- 10.3-stable   # Flu shot [OCT 2024]; Pneumococcal- [2021; q 5-10 y]   ZOMETA- last  06/24/2023 q 29M;    # DISPOSITION: # follow up in 1 month MD-  port/labs- cbc/cmp;CEA; possible zometa; CT CAP-Dr.B    All questions were answered. The patient knows to call the clinic with any  problems, questions or concerns.    Earna Coder, MD 08/25/2023 1:49 PM

## 2023-08-25 NOTE — Assessment & Plan Note (Addendum)
# [  april2021] Stage IV metastatic non-small cell lung cancer favor adeno; mets to bone; liver.   Oligo-metastatic progression-s/p RT right upper lobe mass [s/p dec 15th, 2022].  Currently on Keytruda maintenance. OCT 28th, 2024-  Unchanged post treatment appearance of the right pulmonary apex with fibrotic scarring and volume loss.  Unchanged, sclerotic, expansile lesions of the posterior right third through fifth ribs;  Unchanged posterior bridging fusion of T1-T6 across sclerotic pathologic fractures of T3-T4;  No evidence of lymphadenopathy or soft tissue metastatic disease in the chest, abdomen, or pelvis.   # HOLD keytruda maintainance today as patient is currently > 2 years of treatment.  I reviewed the rationale with the patient and wife of holding [place].  Will repeat imaging in 1 month-if no active disease we will continue surveillance every 3 months scans.  Labs today reviewed- stable.  # Bone metastases- [[facility], Dr.Parks]. Continue multivitamin with calcium plus vitamin D 3 times a day.  S/p extractions. jan 19th, 2024; s/p dental clearance [April 2024]. vit D 25 OH- march 2024- 68. Currently on Zometa 3 mg q 3 M. stable   # Hypothyroidism- [ Mid sep 2022]  Normal T3-T4.  Currently on 50 mcg a day; JUNE 2024- WNL- stable   # COPD-. On 2-3 lit O2; Trelegy [s/p [facility] pul]- stable   # prostatomegaly- s/p  Dr.Brandon re: prostate enlargement- on Flomax/Proscar [Dr.Bradon]- PSA- 10.3-stable   # Flu shot [OCT 2024]; Pneumococcal- [2021; q 5-10 y]   ZOMETA- last  06/24/2023 q 61M;    # DISPOSITION: # follow up in 1 month MD-  port/labs- cbc/cmp;CEA; possible zometa; CT CAP-Dr.B

## 2023-08-26 LAB — THYROID PANEL WITH TSH
Free Thyroxine Index: 2.4 (ref 1.2–4.9)
T3 Uptake Ratio: 29 % (ref 24–39)
T4, Total: 8.3 ug/dL (ref 4.5–12.0)
TSH: 3.52 u[IU]/mL (ref 0.450–4.500)

## 2023-08-26 LAB — CEA: CEA: 2.1 ng/mL (ref 0.0–4.7)

## 2023-09-02 DIAGNOSIS — J441 Chronic obstructive pulmonary disease with (acute) exacerbation: Secondary | ICD-10-CM | POA: Diagnosis not present

## 2023-09-02 DIAGNOSIS — J9601 Acute respiratory failure with hypoxia: Secondary | ICD-10-CM | POA: Diagnosis not present

## 2023-09-08 ENCOUNTER — Other Ambulatory Visit: Payer: Self-pay | Admitting: Internal Medicine

## 2023-09-08 DIAGNOSIS — G4700 Insomnia, unspecified: Secondary | ICD-10-CM

## 2023-09-14 ENCOUNTER — Ambulatory Visit
Admission: RE | Admit: 2023-09-14 | Discharge: 2023-09-14 | Disposition: A | Discharge status: Home / Self Care | Payer: Medicare HMO | Source: Ambulatory Visit | Attending: Internal Medicine | Admitting: Internal Medicine

## 2023-09-14 DIAGNOSIS — C3411 Malignant neoplasm of upper lobe, right bronchus or lung: Secondary | ICD-10-CM | POA: Insufficient documentation

## 2023-09-14 DIAGNOSIS — I700 Atherosclerosis of aorta: Secondary | ICD-10-CM | POA: Diagnosis not present

## 2023-09-14 DIAGNOSIS — C3490 Malignant neoplasm of unspecified part of unspecified bronchus or lung: Secondary | ICD-10-CM | POA: Diagnosis not present

## 2023-09-14 DIAGNOSIS — J439 Emphysema, unspecified: Secondary | ICD-10-CM | POA: Diagnosis not present

## 2023-09-14 MED ORDER — IOHEXOL 300 MG/ML  SOLN
80.0000 mL | Freq: Once | INTRAMUSCULAR | Status: AC | PRN
  Administered 2023-09-14: 80 mL via INTRAVENOUS

## 2023-09-16 ENCOUNTER — Encounter: Payer: Self-pay | Admitting: Internal Medicine

## 2023-09-17 ENCOUNTER — Other Ambulatory Visit: Payer: Self-pay

## 2023-09-17 DIAGNOSIS — N400 Enlarged prostate without lower urinary tract symptoms: Secondary | ICD-10-CM

## 2023-09-21 ENCOUNTER — Other Ambulatory Visit: Payer: Medicare HMO

## 2023-09-21 DIAGNOSIS — R972 Elevated prostate specific antigen [PSA]: Secondary | ICD-10-CM | POA: Diagnosis not present

## 2023-09-21 DIAGNOSIS — N400 Enlarged prostate without lower urinary tract symptoms: Secondary | ICD-10-CM | POA: Diagnosis not present

## 2023-09-22 ENCOUNTER — Ambulatory Visit: Payer: Medicare HMO | Admitting: Urology

## 2023-09-22 VITALS — BP 133/74 | HR 89 | Ht 69.0 in

## 2023-09-22 DIAGNOSIS — N400 Enlarged prostate without lower urinary tract symptoms: Secondary | ICD-10-CM

## 2023-09-22 DIAGNOSIS — Z87898 Personal history of other specified conditions: Secondary | ICD-10-CM | POA: Diagnosis not present

## 2023-09-22 LAB — PSA: Prostate Specific Ag, Serum: 2.8 ng/mL (ref 0.0–4.0)

## 2023-09-22 LAB — BLADDER SCAN AMB NON-IMAGING: Scan Result: 17

## 2023-09-22 NOTE — Progress Notes (Signed)
 I,Cody [name],acting as a scribe for Cody Scotland, MD.,have documented all relevant documentation on the behalf of Cody Scotland, MD,as directed by  Cody Scotland, MD while in the presence of Cody Scotland, MD.  09/22/2023 4:00 PM   [name] [birth year] [phone]  Referring provider: Alba Cory, MD [address] [place],  [place] [address]  Chief Complaint  Patient presents with   Follow-up    HPI: 68 year-old male with a personal history of BPH presents today for a six month follow-up.  Notably, he has a prostate volume of 68 back in 2016, at which time he underwent prostate biopsy for elevated PSA to 5.3. He was lost to follow-up after being diagnosed with Stage 4 lung cancer.   He also had an elevated PSA to 10.34 in August 2024, which showed prostatomegaly and sequela of metastatic lung cancer, but otherwise unremarkable. Last visit, we elected to manage this fairly conservatively in terms of his elevated PSA, especially in light of his medical comorbidities. Encouraged him to take Flomax as prescribed and Finasteride was added to his regimen, and he's since had a marked decline in his PSA, down to 2.8 as of 09/21/2023.   He is doing well overall with no new symptoms or complaints.    IPSS     Row Name 09/22/23 1400         International Prostate Symptom Score   How often have you had the sensation of not emptying your bladder? Not at All     How often have you had to urinate less than every two hours? Not at All     How often have you found you stopped and started again several times when you urinated? Not at All     How often have you found it difficult to postpone urination? Less than 1 in 5 times     How often have you had a weak urinary stream? Not at All     How often have you had to strain to start urination? Not at All     How many times did you typically get up at night to urinate? 1 Time     Total IPSS Score 2       Quality of Life  due to urinary symptoms   If you were to spend the rest of your life with your urinary condition just the way it is now how would you feel about that? Mostly Satisfied            Score:  1-7 Mild 8-19 Moderate 20-35 Severe Results for orders placed or performed in visit on 09/22/23  BLADDER SCAN AMB NON-IMAGING  Result Value Ref Range   Scan Result 17 ml     PMH: Past Medical History:  Diagnosis Date   Allergy    Cancer of upper lobe of right lung (HCC) 10/2019   COPD (chronic obstructive pulmonary disease) (HCC)    Prostate enlargement     Surgical History: Past Surgical History:  Procedure Laterality Date   HERNIA REPAIR Bilateral 1982   HERNIA REPAIR Right 1994   IR IMAGING GUIDED PORT INSERTION  02/24/2020   LAMINECTOMY FOR EXCISION / EVACUATION INTRASPINAL LESION  11/07/2019   T1-T 6 at [facility] Medications:  Allergies as of 09/22/2023   No Known Allergies      Medication List        Accurate as of September 22, 2023  4:00 PM. If you  have any questions, ask your nurse or doctor.          acetaminophen 500 MG tablet Commonly known as: TYLENOL Take 500-1,000 mg by mouth every 6 (six) hours as needed for mild pain or fever.   albuterol 108 (90 Base) MCG/ACT inhaler Commonly known as: VENTOLIN HFA Inhale 2 puffs into the lungs every 4 (four) hours as needed for wheezing or shortness of breath.   escitalopram 5 MG tablet Commonly known as: LEXAPRO Take 1 tablet (5 mg total) by mouth every morning.   finasteride 5 MG tablet Commonly known as: PROSCAR TAKE 1 TABLET BY MOUTH DAILY   ipratropium-albuterol 0.5-2.5 (3) MG/3ML Soln Commonly known as: DUONEB Take 3 mLs by nebulization every 6 (six) hours.   levothyroxine 50 MCG tablet Commonly known as: SYNTHROID TAKE 1 TABLET BY MOUTH DAILY BEFORE BREAKFAST   Loratadine 10 MG Caps   multivitamin tablet Take 1 tablet by mouth daily.   Oyster Shell Calcium 500-400 MG-UNIT Tabs Take 500  tablets by mouth.   pravastatin 20 MG tablet Commonly known as: PRAVACHOL Take 1 tablet (20 mg total) by mouth daily.   QUEtiapine 25 MG tablet Commonly known as: SEROQUEL TAKE 1 TABLET BY MOUTH AT BEDTIME   tamsulosin 0.4 MG Caps capsule Commonly known as: FLOMAX TAKE 1 CAPSULE BY MOUTH DAILY        Family History: Family History  Problem Relation Age of Onset   Heart disease Father    CVA Mother    Lung cancer Sister    Lung cancer Brother     Social History:  reports that he quit smoking about 12 years ago. His smoking use included cigarettes. He started smoking about 42 years ago. He has a 60 pack-year smoking history. He has never used smokeless tobacco. He reports current alcohol use of about 5.0 standard drinks of alcohol per week. He reports that he does not use drugs.   Physical Exam: BP 133/74   Pulse 89   Ht 5\' 9"  (1.753 m)   BMI 17.93 kg/m   Constitutional:  Alert and oriented, No acute distress. HEENT: [place] AT, moist mucus membranes.  Trachea midline, no masses. Neurologic: Grossly intact, no focal deficits, moving all 4 extremities. Psychiatric: Normal mood and affect.   Assessment & Plan:    1. History of elevated PSA  - He has had a nice decline since being managed on Finasteride. Sent in refills for Finasteride and Flomax. Symptoms well managed on these.  - Continue to check PSA annually.  Return in about 1 year (around 09/21/2024) for PSA, IPSS, PVR.  I have reviewed the above documentation for accuracy and completeness, and I agree with the above.   Cody Scotland, MD   [facility] [address], [address] [place], [place] [address] [phone]

## 2023-09-25 ENCOUNTER — Inpatient Hospital Stay: Payer: Medicare HMO

## 2023-09-25 ENCOUNTER — Inpatient Hospital Stay: Payer: Medicare HMO | Attending: Internal Medicine

## 2023-09-25 ENCOUNTER — Encounter: Payer: Self-pay | Admitting: Internal Medicine

## 2023-09-25 ENCOUNTER — Inpatient Hospital Stay (HOSPITAL_BASED_OUTPATIENT_CLINIC_OR_DEPARTMENT_OTHER): Payer: Medicare HMO | Admitting: Internal Medicine

## 2023-09-25 DIAGNOSIS — J449 Chronic obstructive pulmonary disease, unspecified: Secondary | ICD-10-CM | POA: Diagnosis not present

## 2023-09-25 DIAGNOSIS — M549 Dorsalgia, unspecified: Secondary | ICD-10-CM | POA: Diagnosis not present

## 2023-09-25 DIAGNOSIS — J439 Emphysema, unspecified: Secondary | ICD-10-CM | POA: Insufficient documentation

## 2023-09-25 DIAGNOSIS — C7951 Secondary malignant neoplasm of bone: Secondary | ICD-10-CM | POA: Diagnosis not present

## 2023-09-25 DIAGNOSIS — Z79899 Other long term (current) drug therapy: Secondary | ICD-10-CM | POA: Insufficient documentation

## 2023-09-25 DIAGNOSIS — C3411 Malignant neoplasm of upper lobe, right bronchus or lung: Secondary | ICD-10-CM | POA: Insufficient documentation

## 2023-09-25 DIAGNOSIS — Z8249 Family history of ischemic heart disease and other diseases of the circulatory system: Secondary | ICD-10-CM | POA: Insufficient documentation

## 2023-09-25 DIAGNOSIS — L905 Scar conditions and fibrosis of skin: Secondary | ICD-10-CM | POA: Diagnosis not present

## 2023-09-25 DIAGNOSIS — M2550 Pain in unspecified joint: Secondary | ICD-10-CM | POA: Insufficient documentation

## 2023-09-25 DIAGNOSIS — N400 Enlarged prostate without lower urinary tract symptoms: Secondary | ICD-10-CM | POA: Diagnosis not present

## 2023-09-25 DIAGNOSIS — Z79890 Hormone replacement therapy (postmenopausal): Secondary | ICD-10-CM | POA: Insufficient documentation

## 2023-09-25 DIAGNOSIS — R54 Age-related physical debility: Secondary | ICD-10-CM | POA: Insufficient documentation

## 2023-09-25 DIAGNOSIS — Z923 Personal history of irradiation: Secondary | ICD-10-CM | POA: Insufficient documentation

## 2023-09-25 DIAGNOSIS — R0602 Shortness of breath: Secondary | ICD-10-CM | POA: Insufficient documentation

## 2023-09-25 DIAGNOSIS — K5730 Diverticulosis of large intestine without perforation or abscess without bleeding: Secondary | ICD-10-CM | POA: Diagnosis not present

## 2023-09-25 DIAGNOSIS — Z823 Family history of stroke: Secondary | ICD-10-CM | POA: Insufficient documentation

## 2023-09-25 DIAGNOSIS — Z87891 Personal history of nicotine dependence: Secondary | ICD-10-CM | POA: Diagnosis not present

## 2023-09-25 DIAGNOSIS — Z801 Family history of malignant neoplasm of trachea, bronchus and lung: Secondary | ICD-10-CM | POA: Diagnosis not present

## 2023-09-25 DIAGNOSIS — I700 Atherosclerosis of aorta: Secondary | ICD-10-CM | POA: Diagnosis not present

## 2023-09-25 DIAGNOSIS — F17290 Nicotine dependence, other tobacco product, uncomplicated: Secondary | ICD-10-CM | POA: Diagnosis not present

## 2023-09-25 DIAGNOSIS — I2510 Atherosclerotic heart disease of native coronary artery without angina pectoris: Secondary | ICD-10-CM | POA: Diagnosis not present

## 2023-09-25 DIAGNOSIS — E039 Hypothyroidism, unspecified: Secondary | ICD-10-CM | POA: Diagnosis not present

## 2023-09-25 DIAGNOSIS — Z5112 Encounter for antineoplastic immunotherapy: Secondary | ICD-10-CM | POA: Insufficient documentation

## 2023-09-25 DIAGNOSIS — R5383 Other fatigue: Secondary | ICD-10-CM | POA: Diagnosis not present

## 2023-09-25 LAB — CBC WITH DIFFERENTIAL (CANCER CENTER ONLY)
Abs Immature Granulocytes: 0.01 10*3/uL (ref 0.00–0.07)
Basophils Absolute: 0 10*3/uL (ref 0.0–0.1)
Basophils Relative: 1 %
Eosinophils Absolute: 0.4 10*3/uL (ref 0.0–0.5)
Eosinophils Relative: 8 %
HCT: 40.7 % (ref 39.0–52.0)
Hemoglobin: 12.6 g/dL — ABNORMAL LOW (ref 13.0–17.0)
Immature Granulocytes: 0 %
Lymphocytes Relative: 21 %
Lymphs Abs: 1.1 10*3/uL (ref 0.7–4.0)
MCH: 30.5 pg (ref 26.0–34.0)
MCHC: 31 g/dL (ref 30.0–36.0)
MCV: 98.5 fL (ref 80.0–100.0)
Monocytes Absolute: 0.5 10*3/uL (ref 0.1–1.0)
Monocytes Relative: 10 %
Neutro Abs: 3.2 10*3/uL (ref 1.7–7.7)
Neutrophils Relative %: 60 %
Platelet Count: 168 10*3/uL (ref 150–400)
RBC: 4.13 MIL/uL — ABNORMAL LOW (ref 4.22–5.81)
RDW: 12.2 % (ref 11.5–15.5)
WBC Count: 5.2 10*3/uL (ref 4.0–10.5)
nRBC: 0 % (ref 0.0–0.2)

## 2023-09-25 LAB — CMP (CANCER CENTER ONLY)
ALT: 14 U/L (ref 0–44)
AST: 19 U/L (ref 15–41)
Albumin: 3.9 g/dL (ref 3.5–5.0)
Alkaline Phosphatase: 58 U/L (ref 38–126)
Anion gap: 13 (ref 5–15)
BUN: 11 mg/dL (ref 8–23)
CO2: 40 mmol/L — ABNORMAL HIGH (ref 22–32)
Calcium: 9.2 mg/dL (ref 8.9–10.3)
Chloride: 87 mmol/L — ABNORMAL LOW (ref 98–111)
Creatinine: 0.51 mg/dL — ABNORMAL LOW (ref 0.61–1.24)
GFR, Estimated: >60 mL/min (ref >60)
Glucose, Bld: 112 mg/dL — ABNORMAL HIGH (ref 70–99)
Potassium: 4.2 mmol/L (ref 3.5–5.1)
Sodium: 140 mmol/L (ref 135–145)
Total Bilirubin: 0.5 mg/dL (ref 0.0–1.2)
Total Protein: 7 g/dL (ref 6.5–8.1)

## 2023-09-25 MED ORDER — HEPARIN SOD (PORK) LOCK FLUSH 100 UNIT/ML IV SOLN
500.0000 [IU] | Freq: Once | INTRAVENOUS | Status: AC
  Administered 2023-09-25: 500 [IU] via INTRAVENOUS
  Filled 2023-09-25: qty 5

## 2023-09-25 MED ORDER — CHLORHEXIDINE GLUCONATE 0.12 % MT SOLN: 15.0000 mL | Freq: Two times a day (BID) | OROMUCOSAL | 1 refills | Status: DC

## 2023-09-25 NOTE — Patient Instructions (Signed)
 ZOMETA- last  06/24/2023

## 2023-09-25 NOTE — Progress Notes (Signed)
 Pt has a dentist appt next Friday for something going on in his lower jaw at the oral surgeon. Sp pt thinks we should hold off on zometa. Appetite is good. Breathing is stable. Oxygen at 4 L via [place] Denies pain.

## 2023-09-25 NOTE — Progress Notes (Signed)
 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   # 2021- MAY 26th-Keytrda x2 cycles; [borderline PS] July 7th-carbo Alimta Keytruda cycle #1  #November 2022-oligometastatic progression-right upper lobe subpleural involvement with; s/p radiation finished December 15.  Temporarily held [facility].  #Jan fourth 2023-restarted Keytruda.  # NGS/MOLECULAR TESTS:   # PALLIATIVE CARE EVALUATION:  # PAIN MANAGEMENT:       Cancer of upper lobe of right lung (HCC)  11/23/2019 Initial Diagnosis   Cancer of upper lobe of right lung (HCC)   12/16/2019 -  Chemotherapy   Patient is on Treatment Plan : LUNG Carboplatin (5) + Pemetrexed (500) + Pembrolizumab (200) D1 q21d Induction x 4 cycles / Maintenance Pemetrexed (500) + Pembrolizumab (200) D1 q21d     12/21/2019 - 02/26/2022 Chemotherapy   Patient is on Treatment Plan : LUNG CARBOplatin / Pemetrexed / Pembrolizumab q21d Induction x 4 cycles / Maintenance Pemetrexed + Pembrolizumab      HISTORY OF PRESENTING ILLNESS: Thin built frail appearing male patient.  No acute distress. Nasal cannula oxygen.  Ambulating independently.    [name] 68 y.o.  male patient with metastatic non-small cell lung cancer; cord compression status post decompressive  surgery; currently on Keytruda maintenance-is here for follow-up/is here to review the results of the CT scan.  Pt had a dentist appt next Friday for worsening pain his lower jaw . Currently awaiting evaluation oral surgeon.  Appetite is good. Breathing is stable. No cough. Oxygen at 4 L via [place] Denies pain.   Review of Systems  Constitutional:  Positive for malaise/fatigue. Negative for chills, diaphoresis and fever.  HENT:  Negative for nosebleeds and sore throat.   Eyes:  Negative for double vision.  Respiratory:  Positive for shortness of breath. Negative for cough, hemoptysis, sputum production and wheezing.   Cardiovascular:  Negative for chest pain, palpitations, orthopnea and leg swelling.  Gastrointestinal:  Negative for abdominal pain, blood in stool, constipation, heartburn, melena, nausea and vomiting.  Genitourinary:  Negative for dysuria, frequency and urgency.  Musculoskeletal:  Positive for back pain and joint pain.  Skin: Negative.  Negative for itching and rash.  Neurological:  Negative for dizziness, tingling, focal weakness, weakness and headaches.  Endo/Heme/Allergies:  Does not bruise/bleed easily.  Psychiatric/Behavioral:  Negative for depression. The patient is not nervous/anxious and does not have insomnia.      MEDICAL HISTORY:  Past Medical History:  Diagnosis Date   Allergy    Cancer of upper lobe of right lung (HCC) 10/2019   COPD (chronic obstructive pulmonary disease) (HCC)    Prostate enlargement     SURGICAL HISTORY: Past Surgical History:  Procedure Laterality Date   HERNIA REPAIR Bilateral 1982   HERNIA REPAIR Right 1994   IR IMAGING GUIDED PORT INSERTION  02/24/2020   LAMINECTOMY FOR EXCISION / EVACUATION INTRASPINAL LESION  11/07/2019   T1-T 6 at  [facility]     SOCIAL HISTORY: Social History   Socioeconomic History   Marital status: Married    Spouse name: [name]    Number of children: 4   Years of education: Not on file   Highest education  level: Some college, no degree  Occupational History   Occupation: dispatch and delivery   Tobacco Use   Smoking status: Former    Current packs/day: 0.00    Average packs/day: 2.0 packs/day for 30.0 years (60.0 ttl pk-yrs)    Types: Cigarettes    Start date: 07/28/1981    Quit date: 07/29/2011    Years since quitting: 12.1   Smokeless tobacco: Never   Tobacco comments:    pt vapes  Vaping Use   Vaping status: Not on file  Substance and Sexual Activity   Alcohol use: Yes    Alcohol/week: 5.0 standard drinks of alcohol    Types: 5 Standard drinks or equivalent per week    Comment: 1-2/day   Drug use: No   Sexual activity: Not Currently    Partners: Female  Other Topics Concern   Not on file  Social History Narrative   Worked in warehouse/ quit smoking in 2015; beer 1/day; in [place]; with wife.    Social Drivers of Corporate investment banker Strain: Low Risk  (07/10/2023)   Overall Financial Resource Strain (CARDIA)    Difficulty of Paying Living Expenses: Not very hard  Food Insecurity: No Food Insecurity (07/10/2023)   Hunger Vital Sign    Worried About Running Out of Food in the Last Year: Never true    Ran Out of Food in the Last Year: Never true  Transportation Needs: No Transportation Needs (07/10/2023)   PRAPARE - Administrator, Civil Service (Medical): No    Lack of Transportation (Non-Medical): No  Physical Activity: Unknown (07/10/2023)   Exercise Vital Sign    Days of Exercise per Week: Patient declined    Minutes of Exercise per Session: Not on file  Stress: No Stress Concern Present (07/10/2023)   [employer] of Occupational Health - Occupational Stress Questionnaire    Feeling of Stress : Only a little  Social Connections: Socially Integrated (07/10/2023)   Social Connection and Isolation Panel [NHANES]    Frequency of Communication with Friends and Family: More than three times a week    Frequency of Social Gatherings with Friends  and Family: Once a week    Attends Religious Services: More than 4 times per year    Active Member of [employer] or Organizations: Yes    Attends Engineer, structural: More than 4 times per year    Marital Status: Married  Catering manager Violence: Not At Risk (10/21/2019)   Humiliation, Afraid, Rape, and Kick questionnaire    Fear of Current or Ex-Partner: No    Emotionally Abused: No    Physically Abused: No    Sexually Abused: No    FAMILY HISTORY: Family History  Problem Relation Age of Onset   Heart disease Father    CVA Mother    Lung cancer Sister    Lung cancer Brother     ALLERGIES:  has no known allergies.  MEDICATIONS:  Current Outpatient Medications  Medication Sig Dispense Refill   acetaminophen (TYLENOL) 500 MG tablet Take 500-1,000 mg by mouth every 6 (six) hours as needed for mild pain or fever.      albuterol (VENTOLIN HFA) 108 (90 Base) MCG/ACT inhaler Inhale 2 puffs into  the lungs every 4 (four) hours as needed for wheezing or shortness of breath. 18 g 0   Calcium Carb-Cholecalciferol (OYSTER SHELL CALCIUM) 500-400 MG-UNIT TABS Take 500 tablets by mouth.     chlorhexidine (PERIDEX) 0.12 % solution Use as directed 15 mLs in the mouth or throat 2 (two) times daily. 250 mL 1   escitalopram (LEXAPRO) 5 MG tablet Take 1 tablet (5 mg total) by mouth every morning. 90 tablet 3   finasteride (PROSCAR) 5 MG tablet TAKE 1 TABLET BY MOUTH DAILY 90 tablet 1   ipratropium-albuterol (DUONEB) 0.5-2.5 (3) MG/3ML SOLN Take 3 mLs by nebulization every 6 (six) hours. 360 mL 12   levothyroxine (SYNTHROID) 50 MCG tablet TAKE 1 TABLET BY MOUTH DAILY BEFORE BREAKFAST 90 tablet 2   Loratadine 10 MG CAPS      Multiple Vitamin (MULTIVITAMIN) tablet Take 1 tablet by mouth daily.     pravastatin (PRAVACHOL) 20 MG tablet Take 1 tablet (20 mg total) by mouth daily. 90 tablet 3   QUEtiapine (SEROQUEL) 25 MG tablet TAKE 1 TABLET BY MOUTH AT BEDTIME 90 tablet 1   tamsulosin (FLOMAX) 0.4  MG CAPS capsule TAKE 1 CAPSULE BY MOUTH DAILY 90 capsule 1   No current facility-administered medications for this visit.   Facility-Administered Medications Ordered in Other Visits  Medication Dose Route Frequency Provider Last Rate Last Admin   heparin lock flush 100 UNIT/ML injection              PHYSICAL EXAMINATION: ECOG PERFORMANCE STATUS: 3 - Symptomatic, >50% confined to bed  Vitals:   09/25/23 0909  BP: 118/68  Pulse: 86  Resp: 18  Temp: 97.7 F (36.5 C)  SpO2: 98%        Filed Weights   09/25/23 0909  Weight: 122 lb 9.6 oz (55.6 kg)      Physical Exam HENT:     Head: Normocephalic and atraumatic.     Mouth/Throat:     Pharynx: No oropharyngeal exudate.  Eyes:     Pupils: Pupils are equal, round, and reactive to light.  Cardiovascular:     Rate and Rhythm: Normal rate and regular rhythm.  Pulmonary:     Effort: No respiratory distress.     Breath sounds: No wheezing.     Comments: Decreased air entry bilaterally. Abdominal:     General: Bowel sounds are normal. There is no distension.     Palpations: Abdomen is soft. There is no mass.     Tenderness: There is no abdominal tenderness. There is no guarding or rebound.  Musculoskeletal:        General: No tenderness. Normal range of motion.     Cervical back: Normal range of motion and neck supple.  Skin:    General: Skin is warm.  Neurological:     Mental Status: He is alert and oriented to person, place, and time.  Psychiatric:        Mood and Affect: Affect normal.     LABORATORY DATA:  I have reviewed the data as listed Lab Results  Component Value Date   WBC 5.2 09/25/2023   HGB 12.6 (L) 09/25/2023   HCT 40.7 09/25/2023   MCV 98.5 09/25/2023   PLT 168 09/25/2023   Recent Labs    08/04/23 1426 08/25/23 1310 09/25/23 0900  NA 138 137 140  K 4.0 4.5 4.2  CL 85* 85* 87*  CO2 40* 41* 40*  GLUCOSE 125* 108* 112*  BUN 12 11 11  CREATININE 0.53* 0.49* 0.51*  CALCIUM 9.3 9.3 9.2   GFRNONAA >60 >60 >60  PROT 7.2 6.8 7.0  ALBUMIN 4.1 3.8 3.9  AST 21 17 19   ALT 14 11 14   ALKPHOS 58 55 58  BILITOT 0.6 0.5 0.5    RADIOGRAPHIC STUDIES: I have personally reviewed the radiological images as listed and agreed with the findings in the report. CT CHEST ABDOMEN PELVIS W CONTRAST Result Date: 09/16/2023 CLINICAL DATA:  Metastatic non-small cell lung cancer restaging * Tracking Code: BO * EXAM: CT CHEST, ABDOMEN, AND PELVIS WITH CONTRAST TECHNIQUE: Multidetector CT imaging of the chest, abdomen and pelvis was performed following the standard protocol during bolus administration of intravenous contrast. RADIATION DOSE REDUCTION: This exam was performed according to the departmental dose-optimization program which includes automated exposure control, adjustment of the mA and/or kV according to patient size and/or use of iterative reconstruction technique. CONTRAST:  80mL OMNIPAQUE IOHEXOL 300 MG/ML  SOLN COMPARISON:  05/25/2023 FINDINGS: CT CHEST FINDINGS Cardiovascular: Right chest port catheter. Aortic atherosclerosis. Normal heart size. Left and right coronary artery calcifications no pericardial effusion. Mediastinum/Nodes: No enlarged mediastinal, hilar, or axillary lymph nodes. Thyroid gland, trachea, and esophagus demonstrate no significant findings. Lungs/Pleura: Severe emphysema. Diffuse bilateral bronchial wall thickening. Unchanged post treatment appearance of the right pulmonary apex with fibrotic scarring and volume loss (series 3, image 27). No pleural effusion or pneumothorax. Musculoskeletal: Unchanged posterior bridging fusion of T1 through T6, with sclerotic, pathologic fractures of T3 and T4. Unchanged expansile sclerotic lesions of the posterior right third through fifth ribs. No acute osseous findings. CT ABDOMEN PELVIS FINDINGS Hepatobiliary: No solid liver abnormality is seen. No gallstones, gallbladder wall thickening, or biliary dilatation. Pancreas: Unremarkable. No  pancreatic ductal dilatation or surrounding inflammatory changes. Spleen: Normal in size without significant abnormality. Adrenals/Urinary Tract: Adrenal glands are unremarkable. 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 Kidneys are otherwise normal, without renal calculi, suspicious lesion, or hydronephrosis. Diffuse urinary bladder wall thickening. Stomach/Bowel: Stomach is within normal limits. Appendix appears normal. No evidence of bowel wall thickening, distention, or inflammatory changes. Sigmoid diverticulosis. Vascular/Lymphatic: Aortic atherosclerosis. No enlarged abdominal or pelvic lymph nodes. Reproductive: Prostatomegaly. Other: Evidence of prior right inguinal hernia repair. Unchanged superficial seroma or inclusion cyst of the right groin (series 2, image 115). No ascites. Musculoskeletal: No acute osseous findings. IMPRESSION: 1. Unchanged post treatment appearance of the right pulmonary apex with fibrotic scarring and volume loss. 2. Unchanged posterior bridging fusion of T1 through T6, with sclerotic, pathologic fractures of T3 and T4. 3. Unchanged expansile sclerotic lesions of the posterior right third through fifth ribs. 4. No evidence of lymphadenopathy or soft tissue metastatic disease in the chest, abdomen, or pelvis. 5. Prostatomegaly with diffuse urinary bladder wall thickening, consistent with chronic outlet obstruction. 6. Severe emphysema and diffuse bilateral bronchial wall thickening. 7. Coronary artery disease. Aortic Atherosclerosis (ICD10-I70.0) and Emphysema (ICD10-J43.9). Electronically Signed   By: Jearld Lesch M.D.   On: 09/16/2023 18:09     ASSESSMENT & PLAN:   Cancer of upper lobe of right lung (HCC) # [april2021] Stage IV metastatic non-small cell lung cancer favor adeno; mets to bone; liver.   Oligo-metastatic progression-s/p RT right upper  lobe mass [s/p dec 15th, 2022].  FEB 17th, 2025-Unchanged post treatment appearance of the right pulmonary apex with fibrotic scarring and volume loss; Unchanged posterior bridging fusion of T1 through T6, with sclerotic, pathologic fractures  of T3 and T4; Unchanged expansile sclerotic lesions of the posterior right third through fifth rib;  No evidence of lymphadenopathy or soft tissue metastatic disease in the chest, abdomen, or pelvis.   # HOLD keytruda maintainance today as patient is currently > 2 years of treatment. Last Rande Lawman was Jan 7th, 2025. I reviewed the rationale with the patient and wife of holding [place].  Given the recent CT scan- FEB 17th- NED/stable disease- continue to HOLD therapies. Will plan Imagig in 4 months; will order at next visit.   # Bone metastases- [[facility], Dr.Parks]. Continue multivitamin with calcium plus vitamin D 3 times a day.  S/p extractions. jan 19th, 2024; s/p dental clearance [April 2024]. vit D 25 [place]- march 2024- 70. Currently on Zometa 3 mg q 3 M. Last Infusion was on NOv 24th- HOLD further therapy given patient dental issues. Await oral surgeon; Dr.Chrisp- [place]. Recommend peridex.   # Hypothyroidism- [ Mid sep 2022]  Normal T3-T4.  Currently on 50 mcg a day; JUNE 2024- WNL- stable   # COPD-. On 2-3 lit O2; Trelegy [s/p [facility] pul]- stable   # prostatomegaly- s/p  Dr.Brandon re: prostate enlargement- on Flomax/Proscar [Dr.Bradon]- PSA- 10.3-stable   # Flu shot [OCT 2024]; Pneumococcal- [2021; q 5-10 y]  #Incidental findings on Imaging  CT ,FEB 2025: Emphysema atherosclerosis prostatomegaly I reviewed/discussed/counseled the patient.   # IV access: port functional;     ZOMETA- last  06/24/2023 q 29M;  Last Rande Lawman was Jan 7th, 2025.  # DISPOSITION: # HOLD Zometa today- de-access # follow up in 2  month MD-  port/labs- cbc/cmp;CEA; possible zometa; Dr.B    All questions were answered. The patient knows to call the clinic with  any problems, questions or concerns.    Earna Coder, MD 09/25/2023 10:13 AM

## 2023-09-25 NOTE — Assessment & Plan Note (Addendum)
# [  april2021] Stage IV metastatic non-small cell lung cancer favor adeno; mets to bone; liver.   Oligo-metastatic progression-s/p RT right upper lobe mass [s/p dec 15th, 2022].  FEB 17th, 2025-Unchanged post treatment appearance of the right pulmonary apex with fibrotic scarring and volume loss; Unchanged posterior bridging fusion of T1 through T6, with sclerotic, pathologic fractures of T3 and T4; Unchanged expansile sclerotic lesions of the posterior right third through fifth rib;  No evidence of lymphadenopathy or soft tissue metastatic disease in the chest, abdomen, or pelvis.   # HOLD keytruda maintainance today as patient is currently > 2 years of treatment. Last Rande Lawman was Jan 7th, 2025. I reviewed the rationale with the patient and wife of holding [place].  Given the recent CT scan- FEB 17th- NED/stable disease- continue to HOLD therapies. Will plan Imagig in 4 months; will order at next visit.   # Bone metastases- [[facility], Dr.Parks]. Continue multivitamin with calcium plus vitamin D 3 times a day.  S/p extractions. jan 19th, 2024; s/p dental clearance [April 2024]. vit D 25 OH- march 2024- 70. Currently on Zometa 3 mg q 3 M. Last Infusion was on NOv 24th- HOLD further therapy given patient dental issues. Await oral surgeon; Dr.Chrisp- [place]. Recommend peridex.   # Hypothyroidism- [ Mid sep 2022]  Normal T3-T4.  Currently on 50 mcg a day; JUNE 2024- WNL- stable   # COPD-. On 2-3 lit O2; Trelegy [s/p [facility] pul]- stable   # prostatomegaly- s/p  Dr.Brandon re: prostate enlargement- on Flomax/Proscar [Dr.Bradon]- PSA- 10.3-stable   # Flu shot [OCT 2024]; Pneumococcal- [2021; q 5-10 y]  #Incidental findings on Imaging  CT ,FEB 2025: Emphysema atherosclerosis prostatomegaly I reviewed/discussed/counseled the patient.   # IV access: port functional;     ZOMETA- last  06/24/2023 q 66M;  Last Rande Lawman was Jan 7th, 2025.  # DISPOSITION: # HOLD Zometa today- de-access # follow  up in 2  month MD-  port/labs- cbc/cmp;CEA; possible zometa; Dr.B

## 2023-09-26 LAB — CEA: CEA: 2.2 ng/mL (ref 0.0–4.7)

## 2023-09-30 DIAGNOSIS — J441 Chronic obstructive pulmonary disease with (acute) exacerbation: Secondary | ICD-10-CM | POA: Diagnosis not present

## 2023-09-30 DIAGNOSIS — J9601 Acute respiratory failure with hypoxia: Secondary | ICD-10-CM | POA: Diagnosis not present

## 2023-10-12 ENCOUNTER — Telehealth: Payer: Self-pay | Admitting: Pulmonary Disease

## 2023-10-12 ENCOUNTER — Other Ambulatory Visit: Payer: Self-pay | Admitting: Internal Medicine

## 2023-10-12 MED ORDER — ALBUTEROL SULFATE HFA 108 (90 BASE) MCG/ACT IN AERS: 2.0000 | INHALATION_SPRAY | RESPIRATORY_TRACT | 0 refills | Status: DC | PRN

## 2023-10-12 NOTE — Telephone Encounter (Signed)
 There are no more PFT available and when he called me back it was too late

## 2023-10-12 NOTE — Telephone Encounter (Signed)
 PT needs Hospital PFT before his appt on 3/20. Please call to advise @ [phone] He said he can do it at 2:00 on Thursday.

## 2023-10-13 ENCOUNTER — Telehealth: Payer: Self-pay | Admitting: Pulmonary Disease

## 2023-10-13 NOTE — Telephone Encounter (Signed)
 I have spoke with Cody [name] and he is aware there are no more PFT appts available before his appt on 10/15/23 unless he wanted to go to [place]. He stated that was too far to drive. He will keep his appt with Dr. Larinda Buttery on 10/15/23 due to having more issues. He under stands when we get more PFT appts we will get him scheduled

## 2023-10-13 NOTE — Telephone Encounter (Signed)
 See last tel encounter (signed) No need to call another [employer]. They have a bad rating he said. Thanks for you efforts.

## 2023-10-14 NOTE — Telephone Encounter (Signed)
 When I spoke with Cody [name] I told him when he saw Dr. Larinda Buttery to talk about changing DME at that time. He must have misunderstood

## 2023-10-15 ENCOUNTER — Encounter: Payer: Self-pay | Admitting: Pulmonary Disease

## 2023-10-15 ENCOUNTER — Ambulatory Visit: Payer: Medicare HMO | Admitting: Pulmonary Disease

## 2023-10-15 VITALS — BP 126/72 | HR 85 | Temp 97.6°F | Ht 69.0 in | Wt 121.8 lb

## 2023-10-15 DIAGNOSIS — R918 Other nonspecific abnormal finding of lung field: Secondary | ICD-10-CM

## 2023-10-15 DIAGNOSIS — J9611 Chronic respiratory failure with hypoxia: Secondary | ICD-10-CM | POA: Diagnosis not present

## 2023-10-15 DIAGNOSIS — J4489 Other specified chronic obstructive pulmonary disease: Secondary | ICD-10-CM

## 2023-10-15 MED ORDER — ARFORMOTEROL TARTRATE 15 MCG/2ML IN NEBU
15.0000 ug | INHALATION_SOLUTION | Freq: Two times a day (BID) | RESPIRATORY_TRACT | 3 refills | Status: DC
Start: 2023-10-15 — End: 2023-12-20

## 2023-10-15 MED ORDER — YUPELRI 175 MCG/3ML IN SOLN: 175.0000 ug | Freq: Every day | RESPIRATORY_TRACT | 3 refills | Status: DC

## 2023-10-15 MED ORDER — BREZTRI AEROSPHERE 160-9-4.8 MCG/ACT IN AERO: 2.0000 | INHALATION_SPRAY | Freq: Two times a day (BID) | RESPIRATORY_TRACT | 3 refills | Status: DC

## 2023-10-15 MED ORDER — FLUTICASONE PROPIONATE 50 MCG/ACT NA SUSP
1.0000 | Freq: Every day | NASAL | 2 refills | Status: DC
Start: 2023-10-15 — End: 2023-12-20

## 2023-10-15 NOTE — Progress Notes (Signed)
 Synopsis: Referred in by Alba Cory, MD   Subjective:   PATIENT ID: [name] GENDER: male DOB: 01/12/1956, MRN: [id number]  Chief Complaint  Patient presents with   Follow-up    Patient reports having more shortness of breath in the last 2 weeks. Reports oxygen was down to 77 on 5 liters of oxygen. No cough.     HPI Mr. Kenner is a 68 year old pleasant gentleman with a past medical history of right upper lobe lung cancer with liver mets and spinal cord compression status post T1 T6 laminectomy and posterior fusion in 2021 at [facility], status post chemoradiation and currently maintained on Keytruda, COPD with significant panlobular emphysema complicated by chronic hypoxic respiratory failure on 4 L nasal cannula presenting today to the pulmonary clinic for further management of his COPD.  He reports that since his cancer diagnosis and specifically after his neck surgery he started having chronic respiratory failure requiring oxygen support.  For the last 6 months to a year his shortness of breath has gotten worse mostly on exertion associated with chest tightness.  He was on Trelegy but felt it was not doing much for him and therefore has been on no inhalers for the past 6 months.  CT chest 10/24 shows stable postradiation right upper lobe fibrotic changes with volume loss.  No evidence of disease progression.  It also shows significant emphysema diffuse bilaterally.  He is having episodes of desaturations specifically in the morning, he is on 5L [place] 24 hours. He uses his dunobes 2 to 3 times a day.   Family history: Both his siblings had lung cancer.  Social history: Previous smoker smoked 2 packs/day for 30 years vaped for few years after that and quit in 2013.  ROS All systems were reviewed and are negative except for the above. Objective:   Vitals:   10/15/23 1455  BP: 126/72  Pulse: 85  Temp: 97.6 F (36.4 C)  TempSrc: Temporal  SpO2: 95%  Weight: 121 lb 12.8 oz  (55.2 kg)  Height: 5\' 9"  (1.753 m)   95% on 4 LPM BMI Readings from Last 3 Encounters:  10/15/23 17.99 kg/m  09/25/23 18.10 kg/m  09/22/23 17.93 kg/m   Wt Readings from Last 3 Encounters:  10/15/23 121 lb 12.8 oz (55.2 kg)  09/25/23 122 lb 9.6 oz (55.6 kg)  08/25/23 121 lb 6.4 oz (55.1 kg)    Physical Exam GEN: NAD, Healthy Appearing HEENT: Supple Neck, Reactive Pupils, EOMI  CVS: Normal S1, Normal S2, RRR, No murmurs or ES appreciated  Lungs: Bibasilar crackles noted. Poor air movement.  Abdomen: Soft, non tender, non distended, + BS  Extremities: Warm and well perfused, No edema  Skin: No suspicious lesions appreciated  Psych: Normal Affect  Labs and imaging were reviewed. Ancillary Information   CBC    Component Value Date/Time   WBC 5.2 09/25/2023 0900   WBC 5.2 08/25/2023 1310   RBC 4.13 (L) 09/25/2023 0900   HGB 12.6 (L) 09/25/2023 0900   HGB 15.2 10/21/2019 1517   HCT 40.7 09/25/2023 0900   HCT 43.0 10/21/2019 1517   PLT 168 09/25/2023 0900   PLT 325 10/21/2019 1517   MCV 98.5 09/25/2023 0900   MCV 86 10/21/2019 1517   MCH 30.5 09/25/2023 0900   MCHC 31.0 09/25/2023 0900   RDW 12.2 09/25/2023 0900   RDW 12.3 10/21/2019 1517   LYMPHSABS 1.1 09/25/2023 0900   LYMPHSABS 1.3 10/21/2019 1517   MONOABS 0.5 09/25/2023 0900  EOSABS 0.4 09/25/2023 0900   EOSABS 0.0 10/21/2019 1517   BASOSABS 0.0 09/25/2023 0900   BASOSABS 0.1 10/21/2019 1517   Labs and imaging were reviewed.      No data to display           Assessment & Plan:  Mr. Rahiem is a 68 year old pleasant gentleman with a past medical history of right upper lobe lung cancer with liver mets and spinal cord compression status post T1 T6 laminectomy and posterior fusion in 2021 at [facility], status post chemoradiation and currently maintained on Keytruda, COPD with significant panlobular emphysema complicated by chronic hypoxic respiratory failure on 4 L nasal cannula presenting today to the  pulmonary clinic for further management of his COPD.  # Presumed COPD no PFTs on file with extensive panlobular emphysema. # Chronic hypoxic respiratory failure on 4 L nasal cannula # Right upper lobe metastatic adenocarcinoma with liver and bone mets currently on Keytruda. # Right upper lobe postradiation fibrotic changes # Desats in the am raises concerns for sleep apnea.   He has not been on inhaler therapy for a while.  I think his shortness of breath is secondary to his underlying emphysema with COPD.  []  PFTs []  Split night  []  Start DuoNebs scheduled every 6 hours.  Favoring nebulizer treatment over inhaler as concern with drug delivery and weak inspiratory force. Will try to get him on either Breztri or arformoterol and Yupelri.  []  Referral placed for pulmonary rehab. []  Follows with Dr. Donneta Romberg - Last CT chest A/P 04/2023 stable disease   RTC in 3 months.   I spent 30 minutes caring for this patient today, including preparing to see the patient, obtaining a medical history , reviewing a separately obtained history, performing a medically appropriate examination and/or evaluation, counseling and educating the patient/family/caregiver, ordering medications, tests, or procedures, documenting clinical information in the electronic health record, and independently interpreting results (not separately reported/billed) and communicating results to the patient/family/caregiver  Janann Colonel, MD [facility] 10/15/2023 3:08 PM

## 2023-10-16 ENCOUNTER — Telehealth: Payer: Self-pay | Admitting: *Deleted

## 2023-10-16 NOTE — Telephone Encounter (Signed)
 At the bottom of the paper says that they need fulm medical hx, most recent offive note and labs and a full list of his meds,. Please fax it to [phone]

## 2023-10-22 ENCOUNTER — Other Ambulatory Visit: Payer: Self-pay | Admitting: Family Medicine

## 2023-10-22 DIAGNOSIS — J4489 Other specified chronic obstructive pulmonary disease: Secondary | ICD-10-CM

## 2023-10-22 NOTE — Telephone Encounter (Signed)
 Medication: revefenacin (YUPELRI) 175 MCG/3ML nebulizer solution was sent on 10/15/2023 by pulmonary team. Medication was confirmed on 10/15/2023 at 3:20 PM by [facility] which is on file. 3 refills   Attempted to call but got a recording of "call cannot be completed as dialed." Will route back to office.

## 2023-10-22 NOTE — Telephone Encounter (Unsigned)
 Copied from CRM [id number]. Topic: Clinical - Medication Refill >> Oct 22, 2023  4:26 PM [name] wrote: Most Recent Primary Care Visit:  Provider: Alba Cory  Department: [facility]  Visit Type: OFFICE VISIT  Date: 07/10/2023  Medication: revefenacin (YUPELRI) 175 MCG/3ML nebulizer solution  Has the patient contacted their pharmacy? Yes (Agent: If no, request that the patient contact the pharmacy for the refill. If patient does not wish to contact the pharmacy document the reason why and proceed with request.) (Agent: If yes, when and what did the pharmacy advise?)  Is this the correct pharmacy for this prescription? Yes If no, delete pharmacy and type the correct one.  This is the patient's preferred pharmacy:  [facility] [id number] [place], [place] - [address] [place] [address] Phone: [phone] Fax: [phone]  [id number] [place], [place] - [address] [place] [address] Phone: [phone] Fax: [phone]  [facility] [address] [place] [address] Phone: [phone] Fax: [phone]   Has the prescription been filled recently? Yes  Is the patient out of the medication? Yes  Has the patient been seen for an appointment in the last year OR does the patient have an upcoming appointment? Yes  Can we respond through MyChart? Yes  Agent: Please be advised that Rx refills may take up to 3 business days. We ask that you follow-up with your pharmacy.

## 2023-10-26 ENCOUNTER — Telehealth: Payer: Self-pay

## 2023-10-26 NOTE — Telephone Encounter (Signed)
 Copied from CRM [id number]. Topic: Clinical - Prescription Issue >> Oct 26, 2023 10:53 AM [name] wrote: Reason for CRM: Patient calling in to inquire if his medications can be changed. Patient stated he cannot afford the medications each month. Patient would like different medications to replace revefenacin (YUPELRI) 175 MCG/3ML nebulizer solution budeson-glycopyrrolate-formoterol (BREZTRI AEROSPHERE) 160-9-4.8 MCG/ACT AERO. Patient stated the medications are $315 and $165 per month.

## 2023-10-31 DIAGNOSIS — J441 Chronic obstructive pulmonary disease with (acute) exacerbation: Secondary | ICD-10-CM | POA: Diagnosis not present

## 2023-10-31 DIAGNOSIS — J9601 Acute respiratory failure with hypoxia: Secondary | ICD-10-CM | POA: Diagnosis not present

## 2023-11-09 ENCOUNTER — Other Ambulatory Visit: Payer: Self-pay | Admitting: Family Medicine

## 2023-11-09 DIAGNOSIS — I700 Atherosclerosis of aorta: Secondary | ICD-10-CM

## 2023-11-11 ENCOUNTER — Ambulatory Visit: Attending: Otolaryngology

## 2023-11-11 DIAGNOSIS — J439 Emphysema, unspecified: Secondary | ICD-10-CM | POA: Insufficient documentation

## 2023-11-11 DIAGNOSIS — G4733 Obstructive sleep apnea (adult) (pediatric): Secondary | ICD-10-CM | POA: Insufficient documentation

## 2023-11-11 DIAGNOSIS — J4489 Other specified chronic obstructive pulmonary disease: Secondary | ICD-10-CM | POA: Insufficient documentation

## 2023-11-11 DIAGNOSIS — G4736 Sleep related hypoventilation in conditions classified elsewhere: Secondary | ICD-10-CM | POA: Diagnosis not present

## 2023-11-11 DIAGNOSIS — Z87891 Personal history of nicotine dependence: Secondary | ICD-10-CM | POA: Diagnosis not present

## 2023-11-17 ENCOUNTER — Encounter: Payer: Self-pay | Admitting: Internal Medicine

## 2023-11-17 DIAGNOSIS — G4733 Obstructive sleep apnea (adult) (pediatric): Secondary | ICD-10-CM

## 2023-11-18 ENCOUNTER — Telehealth (INDEPENDENT_AMBULATORY_CARE_PROVIDER_SITE_OTHER): Payer: Self-pay | Admitting: Sleep Medicine

## 2023-11-18 NOTE — Telephone Encounter (Signed)
 Please notify patient that PSG did not reveal significant sleep apnea, however patient had poor sleep efficiency that night. This could be a false negative result. Recommend oral appliance for snoring. Could also think about repeating study at a later time with a sedative.

## 2023-11-18 NOTE — Telephone Encounter (Signed)
 I have notified the patient. He would like to hold off on the oral appliance for now.   Dr. Lucina Sabal- just an FYI  Nothing further needed.

## 2023-11-23 ENCOUNTER — Telehealth: Payer: Self-pay | Admitting: *Deleted

## 2023-11-23 ENCOUNTER — Inpatient Hospital Stay: Payer: Medicare HMO | Attending: Internal Medicine

## 2023-11-23 ENCOUNTER — Encounter: Payer: Self-pay | Admitting: Internal Medicine

## 2023-11-23 ENCOUNTER — Inpatient Hospital Stay: Payer: Medicare HMO

## 2023-11-23 ENCOUNTER — Inpatient Hospital Stay: Payer: Medicare HMO | Admitting: Internal Medicine

## 2023-11-23 VITALS — BP 101/79 | HR 91 | Temp 94.6°F | Resp 18 | Ht 69.0 in | Wt 120.0 lb

## 2023-11-23 DIAGNOSIS — N400 Enlarged prostate without lower urinary tract symptoms: Secondary | ICD-10-CM | POA: Insufficient documentation

## 2023-11-23 DIAGNOSIS — Z87891 Personal history of nicotine dependence: Secondary | ICD-10-CM | POA: Insufficient documentation

## 2023-11-23 DIAGNOSIS — E039 Hypothyroidism, unspecified: Secondary | ICD-10-CM | POA: Diagnosis not present

## 2023-11-23 DIAGNOSIS — J449 Chronic obstructive pulmonary disease, unspecified: Secondary | ICD-10-CM | POA: Diagnosis not present

## 2023-11-23 DIAGNOSIS — C3411 Malignant neoplasm of upper lobe, right bronchus or lung: Secondary | ICD-10-CM

## 2023-11-23 DIAGNOSIS — C7951 Secondary malignant neoplasm of bone: Secondary | ICD-10-CM | POA: Insufficient documentation

## 2023-11-23 DIAGNOSIS — C787 Secondary malignant neoplasm of liver and intrahepatic bile duct: Secondary | ICD-10-CM | POA: Insufficient documentation

## 2023-11-23 DIAGNOSIS — Z95828 Presence of other vascular implants and grafts: Secondary | ICD-10-CM

## 2023-11-23 LAB — CBC WITH DIFFERENTIAL (CANCER CENTER ONLY)
Abs Immature Granulocytes: 0.02 10*3/uL (ref 0.00–0.07)
Basophils Absolute: 0 10*3/uL (ref 0.0–0.1)
Basophils Relative: 1 %
Eosinophils Absolute: 0.2 10*3/uL (ref 0.0–0.5)
Eosinophils Relative: 4 %
HCT: 40.8 % (ref 39.0–52.0)
Hemoglobin: 12.5 g/dL — ABNORMAL LOW (ref 13.0–17.0)
Immature Granulocytes: 0 %
Lymphocytes Relative: 18 %
Lymphs Abs: 0.8 10*3/uL (ref 0.7–4.0)
MCH: 29.6 pg (ref 26.0–34.0)
MCHC: 30.6 g/dL (ref 30.0–36.0)
MCV: 96.5 fL (ref 80.0–100.0)
Monocytes Absolute: 0.4 10*3/uL (ref 0.1–1.0)
Monocytes Relative: 9 %
Neutro Abs: 3.1 10*3/uL (ref 1.7–7.7)
Neutrophils Relative %: 68 %
Platelet Count: 142 10*3/uL — ABNORMAL LOW (ref 150–400)
RBC: 4.23 MIL/uL (ref 4.22–5.81)
RDW: 11.8 % (ref 11.5–15.5)
WBC Count: 4.5 10*3/uL (ref 4.0–10.5)
nRBC: 0 % (ref 0.0–0.2)

## 2023-11-23 LAB — CMP (CANCER CENTER ONLY)
ALT: 11 U/L (ref 0–44)
AST: 17 U/L (ref 15–41)
Albumin: 3.6 g/dL (ref 3.5–5.0)
Alkaline Phosphatase: 57 U/L (ref 38–126)
Anion gap: 7 (ref 5–15)
BUN: 15 mg/dL (ref 8–23)
CO2: 45 mmol/L — ABNORMAL HIGH (ref 22–32)
Calcium: 8.5 mg/dL — ABNORMAL LOW (ref 8.9–10.3)
Chloride: 85 mmol/L — ABNORMAL LOW (ref 98–111)
Creatinine: 0.46 mg/dL — ABNORMAL LOW (ref 0.61–1.24)
GFR, Estimated: >60 mL/min (ref >60)
Glucose, Bld: 120 mg/dL — ABNORMAL HIGH (ref 70–99)
Potassium: 4 mmol/L (ref 3.5–5.1)
Sodium: 137 mmol/L (ref 135–145)
Total Bilirubin: 0.5 mg/dL (ref 0.0–1.2)
Total Protein: 6.8 g/dL (ref 6.5–8.1)

## 2023-11-23 MED ORDER — HEPARIN SOD (PORK) LOCK FLUSH 100 UNIT/ML IV SOLN
500.0000 [IU] | Freq: Once | INTRAVENOUS | Status: AC
  Administered 2023-11-23: 500 [IU] via INTRAVENOUS
  Filled 2023-11-23: qty 5

## 2023-11-23 NOTE — Progress Notes (Signed)
 Since last visit his has had some of his jaw bone removed.  C/o having trouble getting to sleep. Taking seroquel  25 mg at bedtime, can you increase dosage?

## 2023-11-23 NOTE — Assessment & Plan Note (Signed)
# [  april2021] Stage IV metastatic non-small cell lung cancer favor adeno; mets to bone; liver.   Oligo-metastatic progression-s/p RT right upper lobe mass [s/p dec 15th, 2022].  FEB 17th, 2025-Unchanged post treatment appearance of the right pulmonary apex with fibrotic scarring and volume loss; Unchanged posterior bridging fusion of T1 through T6, with sclerotic, pathologic fractures of T3 and T4; Unchanged expansile sclerotic lesions of the posterior right third through fifth rib;  No evidence of lymphadenopathy or soft tissue metastatic disease in the chest, abdomen, or pelvis.   # CONTINUE TO HOLD keytruda  maintainance today as patient is currently > 2 years of treatment. Last Keytruda  was Jan 7th, 2025. Will continue surveillance at this time- will repeat imaging in 2  months- will order today.   # Bone metastases with JAW ONJ [2025]- [[facility], Dr.Parks]. Continue multivitamin with calcium plus vitamin D  3 times a day.  S/p extractions. jan 19th, 2024; s/p dental clearance [April 2024]. vit D 25 OH- march 2024- 70.  S/p jaw bone extraction [April 2025]-WILL DISCONTINUE ZOMETA   # Hypothyroidism- [ Mid sep 2022]  Normal T3-T4.  Currently on 50 mcg a day- stable.   # COPD-. On 2-3 lit O2; Trelegy [s/p [facility] pul]- stable   # prostatomegaly- s/p  Dr.Brandon re: prostate enlargement- on Flomax /Proscar  [Dr.Bradon]- PSA- 10.3-stable   # Flu shot [OCT 2024]; Pneumococcal- [2021; q 5-10 y]  # IV access: port functional;     ZOMETA - last  06/24/2023 q 29M;  Last Keytruda  was Jan 7th, 2025.  # DISPOSITION: # HOLD Zometa  today- de-access # follow up in 2  month MD-  port/labs- cbc/cmp;CEA; CT [facility]- Dr.B

## 2023-11-23 NOTE — Progress Notes (Signed)
 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   # 2021- MAY 26th-Keytrda x2 cycles; [borderline PS] July 7th-carbo Alimta  Keytruda  cycle #1  #November 2022-oligometastatic progression-right upper lobe subpleural involvement with; s/p radiation finished December 15.  Temporarily held Keytruda .  #Jan fourth 2023-restarted Keytruda .  # NGS/MOLECULAR TESTS:   # PALLIATIVE CARE EVALUATION:  # PAIN MANAGEMENT:       Cancer of upper lobe of right lung (HCC)  11/23/2019 Initial Diagnosis   Cancer of upper lobe of right lung (HCC)   12/16/2019 -  Chemotherapy   Patient is on Treatment Plan : LUNG Carboplatin  (5) + Pemetrexed  (500) + Pembrolizumab  (200) D1 q21d Induction x 4 cycles / Maintenance Pemetrexed  (500) + Pembrolizumab  (200) D1 q21d     12/21/2019 - 02/26/2022 Chemotherapy   Patient is on Treatment Plan : LUNG CARBOplatin  / Pemetrexed  / Pembrolizumab  q21d Induction x 4 cycles / Maintenance Pemetrexed  + Pembrolizumab       HISTORY OF PRESENTING ILLNESS: Thin built frail appearing male patient.  No acute distress. Nasal cannula oxygen .  Ambulating independently.    Daneen Dunk 68 y.o.  male patient with metastatic non-small cell lung cancer; cord compression status post decompressive  surgery; currently Surveillance  [ OFF  Keytruda - last Jan 2025] is here for follow-up.  Since last visit his has had some of his jaw bone removed.   C/o having trouble getting to sleep. Taking seroquel  25 mg at bedtime, can you increase dosage  Pt had a dentist appt next Friday for worsening pain his lower jaw . Currently awaiting evaluation oral surgeon.  Appetite is good. Breathing is stable. No cough. Oxygen  at 4 L via [place] Denies pain.   Review of Systems  Constitutional:  Positive for malaise/fatigue. Negative for chills, diaphoresis and fever.  HENT:  Negative for nosebleeds and sore throat.   Eyes:  Negative for double vision.  Respiratory:  Positive for shortness of breath. Negative for cough, hemoptysis, sputum production and wheezing.   Cardiovascular:  Negative for chest pain, palpitations, orthopnea and leg swelling.  Gastrointestinal:  Negative for abdominal pain, blood in stool, constipation, heartburn, melena, nausea and vomiting.  Genitourinary:  Negative for dysuria, frequency and urgency.  Musculoskeletal:  Positive for back pain and joint pain.  Skin: Negative.  Negative for itching and rash.  Neurological:  Negative for dizziness, tingling, focal weakness, weakness and headaches.  Endo/Heme/Allergies:  Does not bruise/bleed easily.  Psychiatric/Behavioral:  Negative for depression. The patient is not nervous/anxious and does not have insomnia.      MEDICAL HISTORY:  Past Medical History:  Diagnosis Date   Allergy    Cancer of upper lobe of right lung (HCC) 10/2019   COPD (chronic obstructive pulmonary disease) (HCC)    Prostate enlargement     SURGICAL HISTORY: Past Surgical History:  Procedure Laterality Date   HERNIA REPAIR Bilateral 1982  HERNIA REPAIR Right 1994   IR IMAGING GUIDED PORT INSERTION  02/24/2020   LAMINECTOMY FOR EXCISION / EVACUATION INTRASPINAL LESION  11/07/2019   T1-T 6 at [facility]     SOCIAL HISTORY: Social History   Socioeconomic  History   Marital status: Married    Spouse name: [name]    Number of children: 4   Years of education: Not on file   Highest education level: Some college, no degree  Occupational History   Occupation: dispatch and delivery   Tobacco Use   Smoking status: Former    Current packs/day: 0.00    Average packs/day: 2.0 packs/day for 30.0 years (60.0 ttl pk-yrs)    Types: Cigarettes    Start date: 07/28/1981    Quit date: 07/29/2011    Years since quitting: 12.3   Smokeless tobacco: Never   Tobacco comments:    pt vapes  Vaping Use   Vaping status: Not on file  Substance and Sexual Activity   Alcohol use: Yes    Alcohol/week: 5.0 standard drinks of alcohol    Types: 5 Standard drinks or equivalent per week    Comment: 1-2/day   Drug use: No   Sexual activity: Not Currently    Partners: Female  Other Topics Concern   Not on file  Social History Narrative   Worked in warehouse/ quit smoking in 2015; beer 1/day; in [place]; with wife.    Social Drivers of Corporate investment banker Strain: Low Risk  (07/10/2023)   Overall Financial Resource Strain (CARDIA)    Difficulty of Paying Living Expenses: Not very hard  Food Insecurity: No Food Insecurity (07/10/2023)   Hunger Vital Sign    Worried About Running Out of Food in the Last Year: Never true    Ran Out of Food in the Last Year: Never true  Transportation Needs: No Transportation Needs (07/10/2023)   PRAPARE - Administrator, Civil Service (Medical): No    Lack of Transportation (Non-Medical): No  Physical Activity: Unknown (07/10/2023)   Exercise Vital Sign    Days of Exercise per Week: Patient declined    Minutes of Exercise per Session: Not on file  Stress: No Stress Concern Present (07/10/2023)   [employer] of Occupational Health - Occupational Stress Questionnaire    Feeling of Stress : Only a little  Social Connections: Socially Integrated (07/10/2023)   Social Connection and Isolation Panel  [NHANES]    Frequency of Communication with Friends and Family: More than three times a week    Frequency of Social Gatherings with Friends and Family: Once a week    Attends Religious Services: More than 4 times per year    Active Member of [employer] or Organizations: Yes    Attends Engineer, structural: More than 4 times per year    Marital Status: Married  Catering manager Violence: Not At Risk (10/21/2019)   Humiliation, Afraid, Rape, and Kick questionnaire    Fear of Current or Ex-Partner: No    Emotionally Abused: No    Physically Abused: No    Sexually Abused: No    FAMILY HISTORY: Family History  Problem Relation Age of Onset   Heart disease Father    CVA Mother    Lung cancer Sister    Lung cancer Brother     ALLERGIES:  has no known allergies.  MEDICATIONS:  Current Outpatient Medications  Medication Sig Dispense Refill   acetaminophen  (TYLENOL ) 500 MG tablet Take 500-1,000 mg by  mouth every 6 (six) hours as needed for mild pain or fever.      albuterol  (VENTOLIN  HFA) 108 (90 Base) MCG/ACT inhaler Inhale 2 puffs into the lungs every 4 (four) hours as needed for wheezing or shortness of breath. 18 g 0   arformoterol  (BROVANA ) 15 MCG/2ML NEBU Take 2 mLs (15 mcg total) by nebulization 2 (two) times daily. 120 mL 3   Calcium Carb-Cholecalciferol (OYSTER SHELL CALCIUM) 500-400 MG-UNIT TABS Take 500 tablets by mouth.     chlorhexidine  (PERIDEX ) 0.12 % solution Use as directed 15 mLs in the mouth or throat 2 (two) times daily. 250 mL 1   escitalopram  (LEXAPRO ) 5 MG tablet Take 1 tablet (5 mg total) by mouth every morning. 90 tablet 3   finasteride  (PROSCAR ) 5 MG tablet TAKE 1 TABLET BY MOUTH DAILY 90 tablet 1   fluticasone  (FLONASE ) 50 MCG/ACT nasal spray Place 1 spray into both nostrils daily. 100 mL 2   ipratropium-albuterol  (DUONEB) 0.5-2.5 (3) MG/3ML SOLN Take 3 mLs by nebulization every 6 (six) hours. 360 mL 12   levothyroxine  (SYNTHROID ) 50 MCG tablet TAKE 1 TABLET  BY MOUTH DAILY BEFORE BREAKFAST 90 tablet 2   Loratadine  10 MG CAPS      Multiple Vitamin (MULTIVITAMIN) tablet Take 1 tablet by mouth daily.     pravastatin  (PRAVACHOL ) 20 MG tablet TAKE 1 TABLET BY MOUTH DAILY 30 tablet 0   QUEtiapine  (SEROQUEL ) 25 MG tablet TAKE 1 TABLET BY MOUTH AT BEDTIME 90 tablet 1   tamsulosin  (FLOMAX ) 0.4 MG CAPS capsule TAKE 1 CAPSULE BY MOUTH DAILY 90 capsule 1   budeson-glycopyrrolate-formoterol (BREZTRI  AEROSPHERE) 160-9-4.8 MCG/ACT AERO Inhale 2 puffs into the lungs in the morning and at bedtime. 3 each 3   revefenacin  (YUPELRI ) 175 MCG/3ML nebulizer solution Take 3 mLs (175 mcg total) by nebulization daily. 90 mL 3   No current facility-administered medications for this visit.   Facility-Administered Medications Ordered in Other Visits  Medication Dose Route Frequency Provider Last Rate Last Admin   heparin  lock flush 100 UNIT/ML injection              PHYSICAL EXAMINATION: ECOG PERFORMANCE STATUS: 3 - Symptomatic, >50% confined to bed  Vitals:   11/23/23 1334  BP: 101/79  Pulse: 91  Resp: 18  Temp: (!) 94.6 F (34.8 C)  SpO2: 94%        Filed Weights   11/23/23 1334  Weight: 120 lb (54.4 kg)      Physical Exam HENT:     Head: Normocephalic and atraumatic.     Mouth/Throat:     Pharynx: No oropharyngeal exudate.  Eyes:     Pupils: Pupils are equal, round, and reactive to light.  Cardiovascular:     Rate and Rhythm: Normal rate and regular rhythm.  Pulmonary:     Effort: No respiratory distress.     Breath sounds: No wheezing.     Comments: Decreased air entry bilaterally. Abdominal:     General: Bowel sounds are normal. There is no distension.     Palpations: Abdomen is soft. There is no mass.     Tenderness: There is no abdominal tenderness. There is no guarding or rebound.  Musculoskeletal:        General: No tenderness. Normal range of motion.     Cervical back: Normal range of motion and neck supple.  Skin:    General:  Skin is warm.  Neurological:     Mental Status: He is alert and  oriented to person, place, and time.  Psychiatric:        Mood and Affect: Affect normal.     LABORATORY DATA:  I have reviewed the data as listed Lab Results  Component Value Date   WBC 4.5 11/23/2023   HGB 12.5 (L) 11/23/2023   HCT 40.8 11/23/2023   MCV 96.5 11/23/2023   PLT 142 (L) 11/23/2023   Recent Labs    08/04/23 1426 08/25/23 1310 09/25/23 0900  NA 138 137 140  K 4.0 4.5 4.2  CL 85* 85* 87*  CO2 40* 41* 40*  GLUCOSE 125* 108* 112*  BUN 12 11 11   CREATININE 0.53* 0.49* 0.51*  CALCIUM 9.3 9.3 9.2  GFRNONAA >60 >60 >60  PROT 7.2 6.8 7.0  ALBUMIN 4.1 3.8 3.9  AST 21 17 19   ALT 14 11 14   ALKPHOS 58 55 58  BILITOT 0.6 0.5 0.5    RADIOGRAPHIC STUDIES: I have personally reviewed the radiological images as listed and agreed with the findings in the report. No results found.    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   Oligo-metastatic progression-s/p RT right upper lobe mass [s/p dec 15th, 2022].  FEB 17th, 2025-Unchanged post treatment appearance of the right pulmonary apex with fibrotic scarring and volume loss; Unchanged posterior bridging fusion of T1 through T6, with sclerotic, pathologic fractures of T3 and T4; Unchanged expansile sclerotic lesions of the posterior right third through fifth rib;  No evidence of lymphadenopathy or soft tissue metastatic disease in the chest, abdomen, or pelvis.   # CONTINUE TO HOLD keytruda  maintainance today as patient is currently > 2 years of treatment. Last Keytruda  was Jan 7th, 2025. Will continue surveillance at this time- will repeat imaging in 2  months- will order today.   # Bone metastases with JAW ONJ [2025]- [[facility], Dr.Parks]. Continue multivitamin with calcium plus vitamin D  3 times a day.  S/p extractions. jan 19th, 2024;  s/p dental clearance [April 2024]. vit D 25 OH- march 2024- 70.  S/p jaw bone extraction [April 2025]-WILL DISCONTINUE ZOMETA   # Hypothyroidism- [ Mid sep 2022]  Normal T3-T4.  Currently on 50 mcg a day- stable.   # COPD-. On 2-3 lit O2; Trelegy [s/p [facility] pul]- stable   # prostatomegaly- s/p  Dr.Brandon re: prostate enlargement- on Flomax /Proscar  [Dr.Bradon]- PSA- 10.3-stable   # Flu shot [OCT 2024]; Pneumococcal- [2021; q 5-10 y]  # IV access: port functional;     ZOMETA - last  06/24/2023 q 49M;  Last Keytruda  was Jan 7th, 2025.  # DISPOSITION: # HOLD Zometa  today- de-access # follow up in 2  month MD-  port/labs- cbc/cmp;CEA; CT [facility]- Dr.B    All questions were answered. The patient knows to call the clinic with any problems, questions or concerns.    Gwyn Leos, MD 11/23/2023 2:01 PM

## 2023-11-23 NOTE — Telephone Encounter (Signed)
 Patient was set up with [facility] and the patient wants to go to the [facility]. Please change the scan to  the place that he wants

## 2023-11-24 ENCOUNTER — Other Ambulatory Visit: Payer: Self-pay | Admitting: *Deleted

## 2023-11-24 DIAGNOSIS — C3411 Malignant neoplasm of upper lobe, right bronchus or lung: Secondary | ICD-10-CM

## 2023-11-24 LAB — CEA: CEA: 3.3 ng/mL (ref 0.0–4.7)

## 2023-11-26 ENCOUNTER — Other Ambulatory Visit: Payer: Self-pay

## 2023-11-26 ENCOUNTER — Emergency Department
Admission: EM | Admit: 2023-11-26 | Discharge: 2023-11-26 | Disposition: A | Discharge status: Home / Self Care | Attending: Emergency Medicine | Admitting: Emergency Medicine

## 2023-11-26 ENCOUNTER — Telehealth: Payer: Self-pay

## 2023-11-26 ENCOUNTER — Emergency Department

## 2023-11-26 DIAGNOSIS — J441 Chronic obstructive pulmonary disease with (acute) exacerbation: Secondary | ICD-10-CM | POA: Insufficient documentation

## 2023-11-26 DIAGNOSIS — J90 Pleural effusion, not elsewhere classified: Secondary | ICD-10-CM | POA: Diagnosis not present

## 2023-11-26 DIAGNOSIS — R0902 Hypoxemia: Secondary | ICD-10-CM | POA: Diagnosis not present

## 2023-11-26 DIAGNOSIS — J439 Emphysema, unspecified: Secondary | ICD-10-CM | POA: Diagnosis not present

## 2023-11-26 DIAGNOSIS — C3490 Malignant neoplasm of unspecified part of unspecified bronchus or lung: Secondary | ICD-10-CM | POA: Diagnosis not present

## 2023-11-26 DIAGNOSIS — Z9981 Dependence on supplemental oxygen: Secondary | ICD-10-CM | POA: Insufficient documentation

## 2023-11-26 DIAGNOSIS — R069 Unspecified abnormalities of breathing: Secondary | ICD-10-CM | POA: Diagnosis not present

## 2023-11-26 DIAGNOSIS — Z85118 Personal history of other malignant neoplasm of bronchus and lung: Secondary | ICD-10-CM | POA: Insufficient documentation

## 2023-11-26 DIAGNOSIS — R0781 Pleurodynia: Secondary | ICD-10-CM | POA: Diagnosis not present

## 2023-11-26 DIAGNOSIS — R0602 Shortness of breath: Secondary | ICD-10-CM | POA: Diagnosis not present

## 2023-11-26 DIAGNOSIS — I10 Essential (primary) hypertension: Secondary | ICD-10-CM | POA: Diagnosis not present

## 2023-11-26 LAB — CBC WITH DIFFERENTIAL/PLATELET
Abs Immature Granulocytes: 0.01 10*3/uL (ref 0.00–0.07)
Basophils Absolute: 0 10*3/uL (ref 0.0–0.1)
Basophils Relative: 1 %
Eosinophils Absolute: 0.2 10*3/uL (ref 0.0–0.5)
Eosinophils Relative: 4 %
HCT: 42.7 % (ref 39.0–52.0)
Hemoglobin: 13.1 g/dL (ref 13.0–17.0)
Immature Granulocytes: 0 %
Lymphocytes Relative: 14 %
Lymphs Abs: 0.7 10*3/uL (ref 0.7–4.0)
MCH: 30.1 pg (ref 26.0–34.0)
MCHC: 30.7 g/dL (ref 30.0–36.0)
MCV: 98.2 fL (ref 80.0–100.0)
Monocytes Absolute: 0.5 10*3/uL (ref 0.1–1.0)
Monocytes Relative: 9 %
Neutro Abs: 3.5 10*3/uL (ref 1.7–7.7)
Neutrophils Relative %: 72 %
Platelets: 161 10*3/uL (ref 150–400)
RBC: 4.35 MIL/uL (ref 4.22–5.81)
RDW: 11.9 % (ref 11.5–15.5)
WBC: 5 10*3/uL (ref 4.0–10.5)
nRBC: 0 % (ref 0.0–0.2)

## 2023-11-26 LAB — COMPREHENSIVE METABOLIC PANEL WITH GFR
ALT: 12 U/L (ref 0–44)
AST: 17 U/L (ref 15–41)
Albumin: 3.7 g/dL (ref 3.5–5.0)
Alkaline Phosphatase: 53 U/L (ref 38–126)
Anion gap: 10 (ref 5–15)
BUN: 12 mg/dL (ref 8–23)
CO2: 43 mmol/L — ABNORMAL HIGH (ref 22–32)
Calcium: 9 mg/dL (ref 8.9–10.3)
Chloride: 87 mmol/L — ABNORMAL LOW (ref 98–111)
Creatinine, Ser: 0.53 mg/dL — ABNORMAL LOW (ref 0.61–1.24)
GFR, Estimated: >60 mL/min (ref >60)
Glucose, Bld: 106 mg/dL — ABNORMAL HIGH (ref 70–99)
Potassium: 4.1 mmol/L (ref 3.5–5.1)
Sodium: 140 mmol/L (ref 135–145)
Total Bilirubin: 0.6 mg/dL (ref 0.0–1.2)
Total Protein: 6.9 g/dL (ref 6.5–8.1)

## 2023-11-26 LAB — RESP PANEL BY RT-PCR (RSV, FLU A&B, COVID)  RVPGX2
Influenza A by PCR: NEGATIVE
Influenza B by PCR: NEGATIVE
Resp Syncytial Virus by PCR: NEGATIVE
SARS Coronavirus 2 by RT PCR: NEGATIVE

## 2023-11-26 LAB — TROPONIN I (HIGH SENSITIVITY)
Troponin I (High Sensitivity): 12 ng/L (ref <18)
Troponin I (High Sensitivity): 10 ng/L (ref <18)

## 2023-11-26 LAB — BRAIN NATRIURETIC PEPTIDE: B Natriuretic Peptide: 92.1 pg/mL (ref 0.0–100.0)

## 2023-11-26 MED ORDER — LIDOCAINE 5 % EX PTCH: 1.0000 | MEDICATED_PATCH | CUTANEOUS | 0 refills | Status: DC

## 2023-11-26 MED ORDER — PREDNISONE 20 MG PO TABS: 40.0000 mg | ORAL_TABLET | Freq: Every day | ORAL | 0 refills | Status: DC

## 2023-11-26 MED ORDER — IOHEXOL 350 MG/ML SOLN
75.0000 mL | Freq: Once | INTRAVENOUS | Status: AC | PRN
  Administered 2023-11-26: 75 mL via INTRAVENOUS

## 2023-11-26 MED ORDER — METHYLPREDNISOLONE SODIUM SUCC 125 MG IJ SOLR
125.0000 mg | Freq: Once | INTRAMUSCULAR | Status: AC
  Administered 2023-11-26: 125 mg via INTRAVENOUS
  Filled 2023-11-26: qty 2

## 2023-11-26 MED ORDER — IPRATROPIUM-ALBUTEROL 0.5-2.5 (3) MG/3ML IN SOLN
3.0000 mL | Freq: Once | RESPIRATORY_TRACT | Status: AC
  Administered 2023-11-26: 3 mL via RESPIRATORY_TRACT
  Filled 2023-11-26: qty 3

## 2023-11-26 NOTE — ED Provider Notes (Addendum)
 [facility] Provider Note    None    (approximate)   History   Shortness of Breath and Chest Pain   HPI  Cody [name] is a 68 y.o. male with history of COPD on 5 L, metastatic adenocarcinoma status post 2 years of Keytruda  who comes in with shortness of breath, chest pain.  Patient was in the 90s on his baseline oxygen  however he reports that starting today he had some pain on the left side of his upper chest.  He denies ever having this before.   I reviewed the note from 11/23/2023 where patient was seen by oncology.  The plan was to continue surveillance at this time with repeat imaging in 2 months.   Physical Exam   Triage Vital Signs: ED Triage Vitals [11/26/23 0920]  Encounter Vitals Group     BP      Systolic BP Percentile      Diastolic BP Percentile      Pulse      Resp      Temp      Temp src      SpO2      Weight 125 lb 10.6 oz (57 kg)     Height      Head Circumference      Peak Flow      Pain Score 3     Pain Loc      Pain Education      Exclude from Growth Chart     Most recent vital signs: Vitals:   11/26/23 0921  BP: (!) 156/83  Pulse: 99  Resp: 17  Temp: 98.6 F (37 C)  SpO2: 97%     General: Awake, no distress.  CV:  Good peripheral perfusion.  Resp:  Normal effort.  Abd:  No distention.  Other:  No wheezing noted bilaterally.  No swelling in legs. No rash noted  ED Results / Procedures / Treatments   Labs (all labs ordered are listed, but only abnormal results are displayed) Labs Reviewed  CBC WITH DIFFERENTIAL/PLATELET  COMPREHENSIVE METABOLIC PANEL WITH GFR  BRAIN NATRIURETIC PEPTIDE  TROPONIN I (HIGH SENSITIVITY)     EKG  My interpretation of EKG:  Sinus rate of 97 without any ST elevation or T wave inversions, right bundle branch block  RADIOLOGY I have reviewed the xray personally and interpreted no obvious pneumonia   PROCEDURES:  Critical Care performed: No  .1-3 Lead EKG  Interpretation  Performed by: Lubertha Rush, MD Authorized by: Lubertha Rush, MD     Interpretation: normal     ECG rate:  80   ECG rate assessment: normal     Rhythm: sinus rhythm     Ectopy: none     Conduction: normal      MEDICATIONS ORDERED IN ED: Medications  ipratropium-albuterol  (DUONEB) 0.5-2.5 (3) MG/3ML nebulizer solution 3 mL (3 mLs Nebulization Given 11/26/23 1021)  methylPREDNISolone  sodium succinate (SOLU-MEDROL ) 125 mg/2 mL injection 125 mg (125 mg Intravenous Given 11/26/23 1021)  iohexol  (OMNIPAQUE ) 350 MG/ML injection 75 mL (75 mLs Intravenous Contrast Given 11/26/23 1144)     IMPRESSION / MDM / ASSESSMENT AND PLAN / ED COURSE  I reviewed the triage vital signs and the nursing notes.   Patient's presentation is most consistent with acute presentation with potential threat to life or bodily function.   Differential includes pneumonia, ACS, PE given patient's cancer history will get labs, cardiac markers to further evaluate. CT given high  risk with lung cancer.   COVID, flu are negative.  CBC reassuring CMP shows elevated CO2 similar to prior as patient has chronic COPD year.  His troponin was negative we will get a repeat.  IMPRESSION: No definite evidence of pulmonary embolus.   Small right pleural effusion.   Stable scarring or post treatment change in right lung apex.   Stable sclerotic expansile lesions involving several right ribs concerning for metastatic disease.   Aortic Atherosclerosis (ICD10-I70.0) and Emphysema (ICD10-J43.9).     1:41 PM reevaluated patient he reports resolution of all symptoms.  Reports feeling at his baseline self.  No increased WOB to suggest hypercapnia and baseline mental status. We discussed doing a course of steroids given he is not been on them currently and in case this was a COPD exacerbation.  No indication for antibiotics at this time.  We discussed lidocaine  patches.  He will follow-up with his oncologist for the CT scan  above.  2:09 PM considered admission for patient given he is on 5 L chronically but patient reports feeling much better.  Repeat troponin is negative.  Patient feels comfortable with discharge home reports being at baseline self  The patient is on the cardiac monitor to evaluate for evidence of arrhythmia and/or significant heart rate changes.      FINAL CLINICAL IMPRESSION(S) / ED DIAGNOSES   Final diagnoses:  COPD exacerbation (HCC)     Rx / DC Orders   ED Discharge Orders          Ordered    lidocaine  (LIDODERM ) 5 %  Every 24 hours        11/26/23 1343    predniSONE  (DELTASONE ) 20 MG tablet  Daily with breakfast        11/26/23 1343             Note:  This document was prepared using Dragon voice recognition software and may include unintentional dictation errors.   Lubertha Rush, MD 11/26/23 1409    Lubertha Rush, MD 11/26/23 [phone]

## 2023-11-26 NOTE — Telephone Encounter (Signed)
 Copied from CRM [id number]. Topic: Clinical - Medication Question >> Nov 26, 2023  3:42 PM Lotus Round B wrote: Reason for CRM: pt called in to see if it is safe to double up on his QUEtiapine  (SEROQUEL ) 25 MG tablet ? Pt wondering if he can get a answer asap if possible

## 2023-11-26 NOTE — Discharge Instructions (Addendum)
 Your workup was reassuring we are starting you on some steroids for your COPD you should follow-up with your primary care doctor return to the ER if you develop worsening symptoms or any other concerns    IMPRESSION: No definite evidence of pulmonary embolus.   Small right pleural effusion.   Stable scarring or post treatment change in right lung apex.   Stable sclerotic expansile lesions involving several right ribs concerning for metastatic disease.   Aortic Atherosclerosis (ICD10-I70.0) and Emphysema (ICD10-J43.9).

## 2023-11-26 NOTE — ED Triage Notes (Signed)
 Pt via [facility] from home. Pt c/o SOB for the past couple of days and L sided rib pain that started yesterday. EMS reports mid-90s on baseline 5L [place]. Per EMS, lung sounds clear. EMS reports 12 lead unremarkable. Pt hx of COPD and lung cancer. Pt is A&Ox4 and NAD

## 2023-11-27 NOTE — Telephone Encounter (Signed)
 Left detailed vm for reason why?

## 2023-11-30 ENCOUNTER — Ambulatory Visit: Payer: Medicare HMO | Admitting: Family Medicine

## 2023-11-30 ENCOUNTER — Telehealth: Payer: Self-pay

## 2023-11-30 DIAGNOSIS — J9601 Acute respiratory failure with hypoxia: Secondary | ICD-10-CM | POA: Diagnosis not present

## 2023-11-30 DIAGNOSIS — J441 Chronic obstructive pulmonary disease with (acute) exacerbation: Secondary | ICD-10-CM | POA: Diagnosis not present

## 2023-11-30 NOTE — Transitions of Care (Post Inpatient/ED Visit) (Signed)
 11/30/2023  Name: Cody [name] MRN: [id number] DOB: 06/03/1956  Today's TOC FU Call Status: Today's TOC FU Call Status:: Successful TOC FU Call Completed TOC FU Call Complete Date: 11/30/23 Patient's Name and Date of Birth confirmed.  Transition Care Management Follow-up Telephone Call Date of Discharge: 11/26/23 Discharge Facility: [facility]) Type of Discharge: Emergency Department Reason for ED Visit: Respiratory Respiratory Diagnosis: COPD Exacerbation How have you been since you were released from the hospital?: Better Any questions or concerns?: No  Items Reviewed: Did you receive and understand the discharge instructions provided?: Yes Medications obtained,verified, and reconciled?: Yes (Medications Reviewed) Any new allergies since your discharge?: No Dietary orders reviewed?: Yes Do you have support at home?: Yes People in Home [RPT]: spouse  Medications Reviewed Today: Medications Reviewed Today     Reviewed by Darrall Ellison, LPN (Licensed Practical Nurse) on 11/30/23 at 1551  Med List Status: <None>   Medication Order Taking? Sig Documenting Provider Last Dose Status Informant  acetaminophen  (TYLENOL ) 500 MG tablet [id number] No Take 500-1,000 mg by mouth every 6 (six) hours as needed for mild pain or fever.  [provider] Taking Active Spouse/Significant Other  albuterol  (VENTOLIN  HFA) 108 (90 Base) MCG/ACT inhaler [id number] No Inhale 2 puffs into the lungs every 4 (four) hours as needed for wheezing or shortness of breath. Sowles, Krichna, MD Taking Active   arformoterol  (BROVANA ) 15 MCG/2ML NEBU [id number] No Take 2 mLs (15 mcg total) by nebulization 2 (two) times daily. Annitta Kindler, MD Taking Active   budeson-glycopyrrolate-formoterol (BREZTRI  AEROSPHERE) 160-9-4.8 MCG/ACT Sudie Ely [id number]  Inhale 2 puffs into the lungs in the morning and at bedtime. Assaker, Marianne Shirts, MD  Active   Calcium Carb-Cholecalciferol  (OYSTER SHELL CALCIUM) 500-400 MG-UNIT TABS [id number] No Take 500 tablets by mouth. [provider] Taking Active   chlorhexidine  (PERIDEX ) 0.12 % solution [id number] No Use as directed 15 mLs in the mouth or throat 2 (two) times daily. Gwyn Leos, MD Taking Active   escitalopram  (LEXAPRO ) 5 MG tablet [id number] No Take 1 tablet (5 mg total) by mouth every morning. Sowles, Krichna, MD Taking Active   finasteride  (PROSCAR ) 5 MG tablet [id number] No TAKE 1 TABLET BY MOUTH DAILY Dustin Gimenez, MD Taking Active   fluticasone  (FLONASE ) 50 MCG/ACT nasal spray [id number] No Place 1 spray into both nostrils daily. Assaker, Marianne Shirts, MD Taking Active   ipratropium-albuterol  (DUONEB) 0.5-2.5 (3) MG/3ML SOLN [id number] No Take 3 mLs by nebulization every 6 (six) hours. Annitta Kindler, MD Taking Active   levothyroxine  (SYNTHROID ) 50 MCG tablet [id number] No TAKE 1 TABLET BY MOUTH DAILY BEFORE BREAKFAST Brahmanday, Govinda R, MD Taking Active   lidocaine  (LIDODERM ) 5 % [id number]  Place 1 patch onto the skin daily for 5 days. Remove & Discard patch within 12 hours or as directed by MD Lubertha Rush, MD  Active   Loratadine  10 MG CAPS [id number] No  [provider] Taking Active   Multiple Vitamin (MULTIVITAMIN) tablet [id number] No Take 1 tablet by mouth daily. [provider] Taking Active   pravastatin  (PRAVACHOL ) 20 MG tablet [id number] No TAKE 1 TABLET BY MOUTH DAILY Sowles, Krichna, MD Taking Active   predniSONE  (DELTASONE ) 20 MG tablet [id number]  Take 2 tablets (40 mg total) by mouth daily with breakfast for 5 days. Lubertha Rush, MD  Active   QUEtiapine  (SEROQUEL ) 25 MG tablet [id number] No TAKE 1 TABLET BY MOUTH AT BEDTIME Brahmanday, Govinda R, MD Taking Active   revefenacin  (YUPELRI )  175 MCG/3ML nebulizer solution [id number]  Take 3 mLs (175 mcg total) by nebulization daily. Annitta Kindler, MD  Active   tamsulosin  (FLOMAX ) 0.4 MG CAPS capsule [id number] No TAKE 1  CAPSULE BY MOUTH DAILY Dustin Gimenez, MD Taking Active             Home Care and Equipment/Supplies: Were Home Health Services Ordered?: NA Any new equipment or medical supplies ordered?: NA  Functional Questionnaire: Do you need assistance with bathing/showering or dressing?: No Do you need assistance with meal preparation?: No Do you need assistance with eating?: No Do you have difficulty maintaining continence: No Do you need assistance with getting out of bed/getting out of a chair/moving?: No Do you have difficulty managing or taking your medications?: No  Follow up appointments reviewed: PCP Follow-up appointment confirmed?: Yes Date of PCP follow-up appointment?: 12/01/23 Follow-up Provider: [facility] Follow-up appointment confirmed?: NA Do you need transportation to your follow-up appointment?: No Do you understand care options if your condition(s) worsen?: Yes-patient verbalized understanding    SIGNATURE Darrall Ellison, LPN [facility] Nurse Health Advisor Direct Dial [phone]

## 2023-12-01 ENCOUNTER — Encounter: Payer: Self-pay | Admitting: Family Medicine

## 2023-12-01 ENCOUNTER — Ambulatory Visit (INDEPENDENT_AMBULATORY_CARE_PROVIDER_SITE_OTHER): Payer: Self-pay | Admitting: Family Medicine

## 2023-12-01 VITALS — BP 128/78 | HR 95 | Temp 98.0°F | Resp 18 | Ht 69.0 in | Wt 120.7 lb

## 2023-12-01 DIAGNOSIS — C3410 Malignant neoplasm of upper lobe, unspecified bronchus or lung: Secondary | ICD-10-CM

## 2023-12-01 DIAGNOSIS — C787 Secondary malignant neoplasm of liver and intrahepatic bile duct: Secondary | ICD-10-CM

## 2023-12-01 DIAGNOSIS — C7951 Secondary malignant neoplasm of bone: Secondary | ICD-10-CM

## 2023-12-01 DIAGNOSIS — D849 Immunodeficiency, unspecified: Secondary | ICD-10-CM

## 2023-12-01 DIAGNOSIS — N401 Enlarged prostate with lower urinary tract symptoms: Secondary | ICD-10-CM

## 2023-12-01 DIAGNOSIS — G4700 Insomnia, unspecified: Secondary | ICD-10-CM

## 2023-12-01 DIAGNOSIS — F341 Dysthymic disorder: Secondary | ICD-10-CM

## 2023-12-01 DIAGNOSIS — I700 Atherosclerosis of aorta: Secondary | ICD-10-CM | POA: Diagnosis not present

## 2023-12-01 DIAGNOSIS — N138 Other obstructive and reflux uropathy: Secondary | ICD-10-CM | POA: Diagnosis not present

## 2023-12-01 DIAGNOSIS — J432 Centrilobular emphysema: Secondary | ICD-10-CM | POA: Diagnosis not present

## 2023-12-01 MED ORDER — PRAVASTATIN SODIUM 20 MG PO TABS: 20.0000 mg | ORAL_TABLET | Freq: Every day | ORAL | 3 refills | Status: DC

## 2023-12-01 MED ORDER — QUETIAPINE FUMARATE 25 MG PO TABS: 25.0000 mg | ORAL_TABLET | Freq: Every day | ORAL | 1 refills | Status: DC

## 2023-12-01 NOTE — Progress Notes (Signed)
 Name: Cody [name]   MRN: [id number]    DOB: Dec 06, 1955   Date:12/01/2023       Progress Note  Subjective  Chief Complaint  Chief Complaint  Patient presents with   Medical Management of Chronic Issues   Hyperlipidemia   Depression    ER f/u SOB   COPD   Discussed the use of AI scribe software for clinical note transcription with the patient, who gave verbal consent to proceed.  History of Present Illness Cody [name] "Ed" is a 68 year old male with lung cancer, emphysema, and COPD who presents with concerns about medication management and recent hospital visit for COPD exacerbation.  He recently experienced a COPD exacerbation, leading to a hospital visit on May 1st due to a drop in oxygen  levels and chest pain. He was treated with prednisone , which he is completing today. His oxygen  levels have improved significantly after adjusting the length of his oxygen  tubing, now reading in the 94-96% range at home. 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  and he is currently on a treatment gap. During visit to [facility] CT chest was done and showed new lesions on right fib cage - possible more metastasis. A scan is scheduled for June 27th to evaluate his condition further. He experiences numbness in his legs due to spinal metastasis but reports that his back pain is manageable and less severe than before.  He is on several medications for his conditions, including Seroquel  for sleep, levothyroxine  for hypothyroidism, pravastatin  and aspirin for atherosclerosis, and Flomax  for BPH. He also uses Brovana  (arformoterol ) for his breathing issues and fluticasone  for allergies. His PSA levels have normalized, and he no longer experiences symptoms of urinary obstruction since started on Flomax .  He reports a recent episode of aggravation and aggressiveness during a phone call with the medical staff,  which he attributes to frustration from not being able to sleep for a few nights. His sleep has improved after taking Seroquel  as prescribed, and he was able to sleep without problems last night. He reports stable weight and no significant changes in mood or anxiety since his oxygen  levels improved. He reports a little bit of runny nose but no significant congestion or sneezing, he takes allergy medications prn      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) 11/29/2019   COPD with acute exacerbation (HCC) 11/29/2019   Cancer of upper lobe of right lung (HCC) 11/23/2019   Metastatic cancer to spine (HCC) 11/01/2019   BPH with elevated PSA 07/16/2015   COPD bronchitis 07/16/2015   Seasonal and perennial allergic rhinitis 04/24/2010    Past Surgical History:  Procedure Laterality Date   HERNIA REPAIR Bilateral 1982   HERNIA REPAIR Right 1994   IR IMAGING GUIDED PORT INSERTION  02/24/2020   LAMINECTOMY FOR EXCISION / EVACUATION INTRASPINAL LESION  11/07/2019   T1-T 6 at [facility]     Family History  Problem Relation Age of Onset   Heart disease Father    CVA Mother  Lung cancer Sister    Lung cancer Brother     Social History   Tobacco Use   Smoking status: Former    Current packs/day: 0.00    Average packs/day: 2.0 packs/day for 30.0 years (60.0 ttl pk-yrs)    Types: Cigarettes    Start date: 07/28/1981    Quit date: 07/29/2011    Years since quitting: 12.3   Smokeless tobacco: Never   Tobacco comments:    pt vapes  Substance Use Topics   Alcohol use: Yes    Alcohol/week: 5.0 standard drinks of alcohol    Types: 5  Standard drinks or equivalent per week    Comment: 1-2/day     Current Outpatient Medications:    acetaminophen  (TYLENOL ) 500 MG tablet, Take 500-1,000 mg by mouth every 6 (six) hours as needed for mild pain or fever. , Disp: , Rfl:    albuterol  (VENTOLIN  HFA) 108 (90 Base) MCG/ACT inhaler, Inhale 2 puffs into the lungs every 4 (four) hours as needed for wheezing or shortness of breath., Disp: 18 g, Rfl: 0   arformoterol  (BROVANA ) 15 MCG/2ML NEBU, Take 2 mLs (15 mcg total) by nebulization 2 (two) times daily., Disp: 120 mL, Rfl: 3   Calcium Carb-Cholecalciferol (OYSTER SHELL CALCIUM) 500-400 MG-UNIT TABS, Take 500 tablets by mouth., Disp: , Rfl:    chlorhexidine  (PERIDEX ) 0.12 % solution, Use as directed 15 mLs in the mouth or throat 2 (two) times daily., Disp: 250 mL, Rfl: 1   escitalopram  (LEXAPRO ) 5 MG tablet, Take 1 tablet (5 mg total) by mouth every morning., Disp: 90 tablet, Rfl: 3   finasteride  (PROSCAR ) 5 MG tablet, TAKE 1 TABLET BY MOUTH DAILY, Disp: 90 tablet, Rfl: 1   fluticasone  (FLONASE ) 50 MCG/ACT nasal spray, Place 1 spray into both nostrils daily., Disp: 100 mL, Rfl: 2   ipratropium-albuterol  (DUONEB) 0.5-2.5 (3) MG/3ML SOLN, Take 3 mLs by nebulization every 6 (six) hours., Disp: 360 mL, Rfl: 12   levothyroxine  (SYNTHROID ) 50 MCG tablet, TAKE 1 TABLET BY MOUTH DAILY BEFORE BREAKFAST, Disp: 90 tablet, Rfl: 2   Loratadine  10 MG CAPS, , Disp: , Rfl:    Multiple Vitamin (MULTIVITAMIN) tablet, Take 1 tablet by mouth daily., Disp: , Rfl:    pravastatin  (PRAVACHOL ) 20 MG tablet, TAKE 1 TABLET BY MOUTH DAILY, Disp: 30 tablet, Rfl: 0   predniSONE  (DELTASONE ) 20 MG tablet, Take 2 tablets (40 mg total) by mouth daily with breakfast for 5 days., Disp: 10 tablet, Rfl: 0   QUEtiapine  (SEROQUEL ) 25 MG tablet, TAKE 1 TABLET BY MOUTH AT BEDTIME, Disp: 90 tablet, Rfl: 1   tamsulosin  (FLOMAX ) 0.4 MG CAPS capsule, TAKE 1 CAPSULE BY MOUTH DAILY, Disp: 90 capsule, Rfl: 1 No current  facility-administered medications for this visit.  Facility-Administered Medications Ordered in Other Visits:    heparin  lock flush 100 UNIT/ML injection, , , ,   No Known Allergies  I personally reviewed active problem list, medication list, allergies, family history with the patient/caregiver today.   ROS  Ten systems reviewed and is negative except as mentioned in HPI    Objective Physical Exam VITALS: SaO2- 95% MEASUREMENTS: Weight- 120.  Constitutional: Patient appears well-developed . Mild respiratory distress, has to speak slowly HEENT: head atraumatic, normocephalic, pupils equal and reactive to light, neck supple Cardiovascular: Normal rate, regular rhythm and normal heart sounds.  No murmur heard. No BLE edema. Pulmonary/Chest: Effort normal and breath sounds normal. No respiratory distress. Abdominal: Soft.  There is no tenderness. Psychiatric: Patient  has a normal mood and affect. behavior is normal. Judgment and thought content normal.   Vitals:   12/01/23 1043  BP: 128/78  Pulse: 95  Resp: 18  Temp: 98 F (36.7 C)  SpO2: 95%  Weight: 120 lb 11.2 oz (54.7 kg)  Height: 5\' 9"  (1.753 m)    Body mass index is 17.82 kg/m.  Recent Results (from the past 2160 hours)  PSA     Status: None   Collection Time: 09/21/23 10:06 AM  Result Value Ref Range   Prostate Specific Ag, Serum 2.8 0.0 - 4.0 ng/mL    Comment: Roche ECLIA methodology. According to the American Urological Association, Serum PSA should decrease and remain at undetectable levels after radical prostatectomy. The AUA defines biochemical recurrence as an initial PSA value 0.2 ng/mL or greater followed by a subsequent confirmatory PSA value 0.2 ng/mL or greater. Values obtained with different assay methods or kits cannot be used interchangeably. Results cannot be interpreted as absolute evidence of the presence or absence of malignant disease.   BLADDER SCAN AMB NON-IMAGING     Status: None    Collection Time: 09/22/23  2:48 PM  Result Value Ref Range   Scan Result 17 ml   CEA     Status: None   Collection Time: 09/25/23  9:00 AM  Result Value Ref Range   CEA 2.2 0.0 - 4.7 ng/mL    Comment: (NOTE)                             Nonsmokers          <3.9                             Smokers             <5.6 Roche [employer] Electrochemiluminescence Immunoassay (ECLIA) Values obtained with different assay methods or kits cannot be used interchangeably.  Results cannot be interpreted as absolute evidence of the presence or absence of malignant disease. Performed At: [facility] [address] [place], [place] [id number] Pearlean Botts MD WU:[id number]   CMP (Cancer Center only)     Status: Abnormal   Collection Time: 09/25/23  9:00 AM  Result Value Ref Range   Sodium 140 135 - 145 mmol/L   Potassium 4.2 3.5 - 5.1 mmol/L   Chloride 87 (L) 98 - 111 mmol/L   CO2 40 (H) 22 - 32 mmol/L   Glucose, Bld 112 (H) 70 - 99 mg/dL    Comment: Glucose reference range applies only to samples taken after fasting for at least 8 hours.   BUN 11 8 - 23 mg/dL   Creatinine 9.56 (L) 2.13 - 1.24 mg/dL   Calcium 9.2 8.9 - 08.6 mg/dL   Total Protein 7.0 6.5 - 8.1 g/dL   Albumin 3.9 3.5 - 5.0 g/dL   AST 19 15 - 41 U/L   ALT 14 0 - 44 U/L   Alkaline Phosphatase 58 38 - 126 U/L   Total Bilirubin 0.5 0.0 - 1.2 mg/dL   GFR, Estimated >57 >84 mL/min    Comment: (NOTE) Calculated using the CKD-EPI Creatinine Equation (2021)    Anion gap 13 5 - 15    Comment: Performed at [facility], [address], [place], [place] [address]  CBC with Differential (Cancer Center Only)     Status: Abnormal   Collection Time: 09/25/23  9:00 AM  Result Value Ref Range   WBC Count 5.2 4.0 - 10.5 K/uL   RBC 4.13 (L) 4.22 - 5.81 MIL/uL   Hemoglobin 12.6 (L) 13.0 - 17.0 g/dL   HCT 40.9 81.1 - 91.4 %   MCV 98.5 80.0 - 100.0 fL   MCH 30.5 26.0 - 34.0 pg   MCHC 31.0 30.0 - 36.0 g/dL   RDW 78.2 95.6 -  21.3 %   Platelet Count 168 150 - 400 K/uL   nRBC 0.0 0.0 - 0.2 %   Neutrophils Relative % 60 %   Neutro Abs 3.2 1.7 - 7.7 K/uL   Lymphocytes Relative 21 %   Lymphs Abs 1.1 0.7 - 4.0 K/uL   Monocytes Relative 10 %   Monocytes Absolute 0.5 0.1 - 1.0 K/uL   Eosinophils Relative 8 %   Eosinophils Absolute 0.4 0.0 - 0.5 K/uL   Basophils Relative 1 %   Basophils Absolute 0.0 0.0 - 0.1 K/uL   Immature Granulocytes 0 %   Abs Immature Granulocytes 0.01 0.00 - 0.07 K/uL    Comment: Performed at [facility], [address] [place], [place] [address]  CEA     Status: None   Collection Time: 11/23/23 12:58 PM  Result Value Ref Range   CEA 3.3 0.0 - 4.7 ng/mL    Comment: (NOTE)                             Nonsmokers          <3.9                             Smokers             <5.6 Roche [employer]ostics Electrochemiluminescence Immunoassay (ECLIA) Values obtained with different assay methods or kits cannot be used interchangeably.  Results cannot be interpreted as absolute evidence of the presence or absence of malignant disease. Performed At: [facility] [address] [place], [place] [id number] Pearlean Botts MD WU:[id number]   CMP (Cancer Center only)     Status: Abnormal   Collection Time: 11/23/23 12:58 PM  Result Value Ref Range   Sodium 137 135 - 145 mmol/L   Potassium 4.0 3.5 - 5.1 mmol/L   Chloride 85 (L) 98 - 111 mmol/L   CO2 45 (H) 22 - 32 mmol/L   Glucose, Bld 120 (H) 70 - 99 mg/dL    Comment: Glucose reference range applies only to samples taken after fasting for at least 8 hours.   BUN 15 8 - 23 mg/dL   Creatinine 2.53 (L) 6.64 - 1.24 mg/dL   Calcium 8.5 (L) 8.9 - 10.3 mg/dL   Total Protein 6.8 6.5 - 8.1 g/dL   Albumin 3.6 3.5 - 5.0 g/dL   AST 17 15 - 41 U/L   ALT 11 0 - 44 U/L   Alkaline Phosphatase 57 38 - 126 U/L   Total Bilirubin 0.5 0.0 - 1.2 mg/dL   GFR, Estimated >40 >34 mL/min    Comment: (NOTE) Calculated using the CKD-EPI Creatinine  Equation (2021)    Anion gap 7 5 - 15    Comment: Performed at [facility], [address] [place], [place] [address]  CBC with Differential (Cancer Center Only)     Status: Abnormal   Collection Time: 11/23/23 12:58 PM  Result Value Ref Range   WBC Count  4.5 4.0 - 10.5 K/uL   RBC 4.23 4.22 - 5.81 MIL/uL   Hemoglobin 12.5 (L) 13.0 - 17.0 g/dL   HCT 52.8 41.3 - 24.4 %   MCV 96.5 80.0 - 100.0 fL   MCH 29.6 26.0 - 34.0 pg   MCHC 30.6 30.0 - 36.0 g/dL   RDW 01.0 27.2 - 53.6 %   Platelet Count 142 (L) 150 - 400 K/uL   nRBC 0.0 0.0 - 0.2 %   Neutrophils Relative % 68 %   Neutro Abs 3.1 1.7 - 7.7 K/uL   Lymphocytes Relative 18 %   Lymphs Abs 0.8 0.7 - 4.0 K/uL   Monocytes Relative 9 %   Monocytes Absolute 0.4 0.1 - 1.0 K/uL   Eosinophils Relative 4 %   Eosinophils Absolute 0.2 0.0 - 0.5 K/uL   Basophils Relative 1 %   Basophils Absolute 0.0 0.0 - 0.1 K/uL   Immature Granulocytes 0 %   Abs Immature Granulocytes 0.02 0.00 - 0.07 K/uL    Comment: Performed at [facility], [address] [place], [place] [address]  CBC with Differential     Status: None   Collection Time: 11/26/23  9:23 AM  Result Value Ref Range   WBC 5.0 4.0 - 10.5 K/uL   RBC 4.35 4.22 - 5.81 MIL/uL   Hemoglobin 13.1 13.0 - 17.0 g/dL   HCT 47.4 25.9 - 56.3 %   MCV 98.2 80.0 - 100.0 fL   MCH 30.1 26.0 - 34.0 pg   MCHC 30.7 30.0 - 36.0 g/dL   RDW 87.5 64.3 - 32.9 %   Platelets 161 150 - 400 K/uL   nRBC 0.0 0.0 - 0.2 %   Neutrophils Relative % 72 %   Neutro Abs 3.5 1.7 - 7.7 K/uL   Lymphocytes Relative 14 %   Lymphs Abs 0.7 0.7 - 4.0 K/uL   Monocytes Relative 9 %   Monocytes Absolute 0.5 0.1 - 1.0 K/uL   Eosinophils Relative 4 %   Eosinophils Absolute 0.2 0.0 - 0.5 K/uL   Basophils Relative 1 %   Basophils Absolute 0.0 0.0 - 0.1 K/uL   Immature Granulocytes 0 %   Abs Immature Granulocytes 0.01 0.00 - 0.07 K/uL    Comment: Performed at [facility], [address]  [place], [place] [address]  Comprehensive metabolic panel     Status: Abnormal   Collection Time: 11/26/23  9:23 AM  Result Value Ref Range   Sodium 140 135 - 145 mmol/L   Potassium 4.1 3.5 - 5.1 mmol/L   Chloride 87 (L) 98 - 111 mmol/L   CO2 43 (H) 22 - 32 mmol/L   Glucose, Bld 106 (H) 70 - 99 mg/dL    Comment: Glucose reference range applies only to samples taken after fasting for at least 8 hours.   BUN 12 8 - 23 mg/dL   Creatinine, Ser 1.66 (L) 0.61 - 1.24 mg/dL   Calcium 9.0 8.9 - 06.3 mg/dL   Total Protein 6.9 6.5 - 8.1 g/dL   Albumin 3.7 3.5 - 5.0 g/dL   AST 17 15 - 41 U/L   ALT 12 0 - 44 U/L   Alkaline Phosphatase 53 38 - 126 U/L   Total Bilirubin 0.6 0.0 - 1.2 mg/dL   GFR, Estimated >01 >60 mL/min    Comment: (NOTE) Calculated using the CKD-EPI Creatinine Equation (2021)    Anion gap 10 5 - 15    Comment: Performed at [facility],  [address] [place], [place] [address]  Troponin I (High Sensitivity)     Status: None   Collection Time: 11/26/23  9:23 AM  Result Value Ref Range   Troponin I (High Sensitivity) 12 <18 ng/L    Comment: (NOTE) Elevated high sensitivity troponin I (hsTnI) values and significant  changes across serial measurements may suggest ACS but many other  chronic and acute conditions are known to elevate hsTnI results.  Refer to the "Links" section for chest pain algorithms and additional  guidance. Performed at [facility], [address] [place], [place] [address]   Brain natriuretic peptide     Status: None   Collection Time: 11/26/23  9:23 AM  Result Value Ref Range   B Natriuretic Peptide 92.1 0.0 - 100.0 pg/mL    Comment: Performed at [facility], [address] [place], [place] [address]  Resp panel by RT-PCR (RSV, Flu A&B, Covid) Anterior Nasal Swab     Status: None   Collection Time: 11/26/23 10:20 AM   Specimen: Anterior Nasal Swab  Result Value Ref Range   SARS Coronavirus 2 by RT PCR NEGATIVE NEGATIVE     Comment: (NOTE) SARS-CoV-2 target nucleic acids are NOT DETECTED.  The SARS-CoV-2 RNA is generally detectable in upper respiratory specimens during the acute phase of infection. The lowest concentration of SARS-CoV-2 viral copies this assay can detect is 138 copies/mL. A negative result does not preclude SARS-Cov-2 infection and should not be used as the sole basis for treatment or other patient management decisions. A negative result may occur with  improper specimen collection/handling, submission of specimen other than nasopharyngeal swab, presence of viral mutation(s) within the areas targeted by this assay, and inadequate number of viral copies(<138 copies/mL). A negative result must be combined with clinical observations, patient history, and epidemiological information. The expected result is Negative.  Fact Sheet for Patients:  BloggerCourse.com  Fact Sheet for Healthcare Providers:  SeriousBroker.it  This test is no t yet approved or cleared by the [place]  FDA and  has been authorized for detection and/or diagnosis of SARS-CoV-2 by FDA under an Emergency Use Authorization (EUA). This EUA will remain  in effect (meaning this test can be used) for the duration of the COVID-19 declaration under Section 564(b)(1) of the Act, 21 U.S.C.section 360bbb-3(b)(1), unless the authorization is terminated  or revoked sooner.       Influenza A by PCR NEGATIVE NEGATIVE   Influenza B by PCR NEGATIVE NEGATIVE    Comment: (NOTE) The Xpert Xpress SARS-CoV-2/FLU/RSV plus assay is intended as an aid in the diagnosis of influenza from Nasopharyngeal swab specimens and should not be used as a sole basis for treatment. Nasal washings and aspirates are unacceptable for Xpert Xpress SARS-CoV-2/FLU/RSV testing.  Fact Sheet for Patients: BloggerCourse.com  Fact Sheet for Healthcare  Providers: SeriousBroker.it  This test is not yet approved or cleared by the [place]  FDA and has been authorized for detection and/or diagnosis of SARS-CoV-2 by FDA under an Emergency Use Authorization (EUA). This EUA will remain in effect (meaning this test can be used) for the duration of the COVID-19 declaration under Section 564(b)(1) of the Act, 21 U.S.C. section 360bbb-3(b)(1), unless the authorization is terminated or revoked.     Resp Syncytial Virus by PCR NEGATIVE NEGATIVE    Comment: (NOTE) Fact Sheet for Patients: BloggerCourse.com  Fact Sheet for Healthcare Providers: SeriousBroker.it  This test is not yet approved or cleared by the [place]  FDA and has  been authorized for detection and/or diagnosis of SARS-CoV-2 by FDA under an Emergency Use Authorization (EUA). This EUA will remain in effect (meaning this test can be used) for the duration of the COVID-19 declaration under Section 564(b)(1) of the Act, 21 U.S.C. section 360bbb-3(b)(1), unless the authorization is terminated or revoked.  Performed at [facility], [address] [place], [place] [address]   Troponin I (High Sensitivity)     Status: None   Collection Time: 11/26/23  1:33 PM  Result Value Ref Range   Troponin I (High Sensitivity) 10 <18 ng/L    Comment: (NOTE) Elevated high sensitivity troponin I (hsTnI) values and significant  changes across serial measurements may suggest ACS but many other  chronic and acute conditions are known to elevate hsTnI results.  Refer to the "Links" section for chest pain algorithms and additional  guidance. Performed at [facility], [address] [place], [place] [address]     Diabetic Foot Exam:     PHQ2/9:    12/01/2023   10:45 AM 05/21/2023    9:15 AM 11/19/2022    9:24 AM 01/14/2021   10:43 AM 08/27/2020    9:48 AM  Depression screen PHQ 2/9   Decreased Interest 0 0 0 0 0  Down, Depressed, Hopeless 0 1 0 1 0  PHQ - 2 Score 0 1 0 1 0  Altered sleeping 0 0 0    Tired, decreased energy 0 0 0 1   Change in appetite 0 0 0 0   Feeling bad or failure about yourself  0 0 0 1   Trouble concentrating 0 0 0 0   Moving slowly or fidgety/restless 0 0 0 0   Suicidal thoughts 0 0 0 0   PHQ-9 Score 0 1 0    Difficult doing work/chores Not difficult at all        phq 9 is negative  Fall Risk:    12/01/2023   10:45 AM 05/21/2023    9:15 AM 11/19/2022    9:24 AM 01/14/2021   10:43 AM 08/27/2020    9:48 AM  Fall Risk   Falls in the past year? 1 0 0 0 0  Number falls in past yr: 0 0 0 0 0  Injury with Fall? 0 0 0 0 0  Risk for fall due to : Impaired balance/gait No Fall Risks No Fall Risks    Follow up  Falls prevention discussed Falls prevention discussed       Assessment & Plan Chronic obstructive pulmonary disease (COPD) exacerbation Recent exacerbation treated with prednisone . Oxygen  levels improved to 94-96% with shorter tubing. - Complete prednisone  course today. - Maintain oxygen  therapy with adjusted tubing length.  Emphysema Managed with oxygen  therapy and inhalers. Current oxygen  at 4 liters resting, increased during activity. Uses Brovana  via nebulizer. - Continue oxygen  therapy as prescribed. - Continue use of Brovana  via nebulizer.  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 Treatment gap with scan scheduled for June 27th. Previous treatments included radiation, chemotherapy, and Keytruda . Spinal metastasis causing leg numbness but symptoms stable.  - Undergo scheduled scan on June 27th. - Follow up with oncologist after July 4th for scan results.  Atherosclerosis of aorta Managed with pravastatin  and aspirin. No recent cardiovascular events. - Continue pravastatin  and aspirin therapy.  Benign prostatic hyperplasia  (BPH) Previously symptomatic, now controlled with Flomax . Recent PSA levels normalized. - Continue Flomax   for BPH management.  Hypothyroidism Managed with levothyroxine  50 mcg daily. No hyperthyroid symptoms. - Continue levothyroxine  50 mcg daily.  Dysthmia Managed with escitalopram . Mood stable with improved oxygen  levels. - Continue escitalopram  therapy.  Allergic rhinitis Managed with fluticasone  and loratadine . Seasonal symptoms with current runny nose. - Continue fluticasone  daily. - Use loratadine  as needed for seasonal symptoms.  BPH  -LUTS improved with Flomax  -last PSA normal

## 2023-12-10 ENCOUNTER — Other Ambulatory Visit: Payer: Self-pay | Admitting: Urology

## 2023-12-10 DIAGNOSIS — N400 Enlarged prostate without lower urinary tract symptoms: Secondary | ICD-10-CM

## 2023-12-15 ENCOUNTER — Ambulatory Visit (INDEPENDENT_AMBULATORY_CARE_PROVIDER_SITE_OTHER): Payer: Self-pay | Admitting: Sleep Medicine

## 2023-12-15 ENCOUNTER — Telehealth: Payer: Self-pay

## 2023-12-15 NOTE — Telephone Encounter (Signed)
 [employer] FMLA paperwork for patient spouse Ranveer Wahlstrom received.

## 2023-12-16 NOTE — Telephone Encounter (Signed)
 Left message for Cody [name] to return call for additional information regarding FMLA.

## 2023-12-17 ENCOUNTER — Emergency Department

## 2023-12-17 ENCOUNTER — Inpatient Hospital Stay

## 2023-12-17 ENCOUNTER — Other Ambulatory Visit: Payer: Self-pay

## 2023-12-17 ENCOUNTER — Telehealth: Payer: Self-pay | Admitting: *Deleted

## 2023-12-17 ENCOUNTER — Inpatient Hospital Stay
Admission: EM | Admit: 2023-12-17 | Discharge: 2023-12-27 | DRG: 189 | Disposition: E | Attending: Internal Medicine | Admitting: Internal Medicine

## 2023-12-17 DIAGNOSIS — Z9221 Personal history of antineoplastic chemotherapy: Secondary | ICD-10-CM | POA: Diagnosis not present

## 2023-12-17 DIAGNOSIS — R0902 Hypoxemia: Secondary | ICD-10-CM | POA: Diagnosis not present

## 2023-12-17 DIAGNOSIS — Z66 Do not resuscitate: Secondary | ICD-10-CM | POA: Diagnosis present

## 2023-12-17 DIAGNOSIS — R627 Adult failure to thrive: Secondary | ICD-10-CM | POA: Diagnosis present

## 2023-12-17 DIAGNOSIS — F419 Anxiety disorder, unspecified: Secondary | ICD-10-CM | POA: Diagnosis present

## 2023-12-17 DIAGNOSIS — C3411 Malignant neoplasm of upper lobe, right bronchus or lung: Secondary | ICD-10-CM | POA: Diagnosis present

## 2023-12-17 DIAGNOSIS — Z87891 Personal history of nicotine dependence: Secondary | ICD-10-CM | POA: Diagnosis not present

## 2023-12-17 DIAGNOSIS — J449 Chronic obstructive pulmonary disease, unspecified: Secondary | ICD-10-CM | POA: Diagnosis not present

## 2023-12-17 DIAGNOSIS — E039 Hypothyroidism, unspecified: Secondary | ICD-10-CM | POA: Diagnosis present

## 2023-12-17 DIAGNOSIS — J9621 Acute and chronic respiratory failure with hypoxia: Principal | ICD-10-CM

## 2023-12-17 DIAGNOSIS — F32A Depression, unspecified: Secondary | ICD-10-CM | POA: Diagnosis present

## 2023-12-17 DIAGNOSIS — J9622 Acute and chronic respiratory failure with hypercapnia: Secondary | ICD-10-CM

## 2023-12-17 DIAGNOSIS — Z1152 Encounter for screening for COVID-19: Secondary | ICD-10-CM | POA: Diagnosis not present

## 2023-12-17 DIAGNOSIS — C7951 Secondary malignant neoplasm of bone: Secondary | ICD-10-CM | POA: Diagnosis present

## 2023-12-17 DIAGNOSIS — N401 Enlarged prostate with lower urinary tract symptoms: Secondary | ICD-10-CM | POA: Diagnosis present

## 2023-12-17 DIAGNOSIS — Z681 Body mass index (BMI) 19 or less, adult: Secondary | ICD-10-CM | POA: Diagnosis not present

## 2023-12-17 DIAGNOSIS — I452 Bifascicular block: Secondary | ICD-10-CM | POA: Diagnosis present

## 2023-12-17 DIAGNOSIS — R64 Cachexia: Secondary | ICD-10-CM | POA: Diagnosis not present

## 2023-12-17 DIAGNOSIS — E43 Unspecified severe protein-calorie malnutrition: Secondary | ICD-10-CM | POA: Diagnosis present

## 2023-12-17 DIAGNOSIS — J441 Chronic obstructive pulmonary disease with (acute) exacerbation: Principal | ICD-10-CM

## 2023-12-17 DIAGNOSIS — C3490 Malignant neoplasm of unspecified part of unspecified bronchus or lung: Secondary | ICD-10-CM

## 2023-12-17 DIAGNOSIS — Z823 Family history of stroke: Secondary | ICD-10-CM | POA: Diagnosis not present

## 2023-12-17 DIAGNOSIS — C787 Secondary malignant neoplasm of liver and intrahepatic bile duct: Secondary | ICD-10-CM | POA: Diagnosis present

## 2023-12-17 DIAGNOSIS — Z8249 Family history of ischemic heart disease and other diseases of the circulatory system: Secondary | ICD-10-CM

## 2023-12-17 DIAGNOSIS — Z79890 Hormone replacement therapy (postmenopausal): Secondary | ICD-10-CM

## 2023-12-17 DIAGNOSIS — Z515 Encounter for palliative care: Secondary | ICD-10-CM | POA: Diagnosis not present

## 2023-12-17 DIAGNOSIS — Z9981 Dependence on supplemental oxygen: Secondary | ICD-10-CM | POA: Diagnosis not present

## 2023-12-17 DIAGNOSIS — R069 Unspecified abnormalities of breathing: Secondary | ICD-10-CM | POA: Diagnosis not present

## 2023-12-17 DIAGNOSIS — R0689 Other abnormalities of breathing: Secondary | ICD-10-CM | POA: Diagnosis not present

## 2023-12-17 DIAGNOSIS — R0602 Shortness of breath: Secondary | ICD-10-CM | POA: Diagnosis not present

## 2023-12-17 DIAGNOSIS — Z801 Family history of malignant neoplasm of trachea, bronchus and lung: Secondary | ICD-10-CM | POA: Diagnosis not present

## 2023-12-17 DIAGNOSIS — Z7951 Long term (current) use of inhaled steroids: Secondary | ICD-10-CM

## 2023-12-17 DIAGNOSIS — Z79899 Other long term (current) drug therapy: Secondary | ICD-10-CM

## 2023-12-17 DIAGNOSIS — R54 Age-related physical debility: Secondary | ICD-10-CM | POA: Diagnosis present

## 2023-12-17 DIAGNOSIS — I959 Hypotension, unspecified: Secondary | ICD-10-CM | POA: Diagnosis not present

## 2023-12-17 LAB — HIV ANTIBODY (ROUTINE TESTING W REFLEX): HIV Screen 4th Generation wRfx: NONREACTIVE

## 2023-12-17 LAB — CBC WITH DIFFERENTIAL/PLATELET
Abs Immature Granulocytes: 0.01 K/uL (ref 0.00–0.07)
Basophils Absolute: 0 K/uL (ref 0.0–0.1)
Basophils Relative: 1 %
Eosinophils Absolute: 0.2 K/uL (ref 0.0–0.5)
Eosinophils Relative: 4 %
HCT: 41.1 % (ref 39.0–52.0)
Hemoglobin: 12.3 g/dL — ABNORMAL LOW (ref 13.0–17.0)
Immature Granulocytes: 0 %
Lymphocytes Relative: 23 %
Lymphs Abs: 1.1 K/uL (ref 0.7–4.0)
MCH: 29.7 pg (ref 26.0–34.0)
MCHC: 29.9 g/dL — ABNORMAL LOW (ref 30.0–36.0)
MCV: 99.3 fL (ref 80.0–100.0)
Monocytes Absolute: 0.4 K/uL (ref 0.1–1.0)
Monocytes Relative: 8 %
Neutro Abs: 3 K/uL (ref 1.7–7.7)
Neutrophils Relative %: 64 %
Platelets: 120 K/uL — ABNORMAL LOW (ref 150–400)
RBC: 4.14 MIL/uL — ABNORMAL LOW (ref 4.22–5.81)
RDW: 12.1 % (ref 11.5–15.5)
WBC: 4.7 K/uL (ref 4.0–10.5)
nRBC: 0 % (ref 0.0–0.2)

## 2023-12-17 LAB — RESP PANEL BY RT-PCR (RSV, FLU A&B, COVID)  RVPGX2
Influenza A by PCR: NEGATIVE
Influenza B by PCR: NEGATIVE
Resp Syncytial Virus by PCR: NEGATIVE
SARS Coronavirus 2 by RT PCR: NEGATIVE

## 2023-12-17 LAB — CBC
HCT: 37.6 % — ABNORMAL LOW (ref 39.0–52.0)
Hemoglobin: 11.3 g/dL — ABNORMAL LOW (ref 13.0–17.0)
MCH: 30 pg (ref 26.0–34.0)
MCHC: 30.1 g/dL (ref 30.0–36.0)
MCV: 99.7 fL (ref 80.0–100.0)
Platelets: 112 10*3/uL — ABNORMAL LOW (ref 150–400)
RBC: 3.77 MIL/uL — ABNORMAL LOW (ref 4.22–5.81)
RDW: 11.9 % (ref 11.5–15.5)
WBC: 4.3 10*3/uL (ref 4.0–10.5)
nRBC: 0 % (ref 0.0–0.2)

## 2023-12-17 LAB — TROPONIN I (HIGH SENSITIVITY)
Troponin I (High Sensitivity): 10 ng/L
Troponin I (High Sensitivity): 8 ng/L (ref <18)

## 2023-12-17 LAB — BASIC METABOLIC PANEL WITH GFR
BUN: 17 mg/dL (ref 8–23)
CO2: >45 mmol/L — ABNORMAL HIGH (ref 22–32)
Calcium: 9.2 mg/dL (ref 8.9–10.3)
Chloride: 84 mmol/L — ABNORMAL LOW (ref 98–111)
Creatinine, Ser: 0.52 mg/dL — ABNORMAL LOW (ref 0.61–1.24)
GFR, Estimated: >60 mL/min
Glucose, Bld: 114 mg/dL — ABNORMAL HIGH (ref 70–99)
Potassium: 4.7 mmol/L (ref 3.5–5.1)
Sodium: 143 mmol/L (ref 135–145)

## 2023-12-17 LAB — D-DIMER, QUANTITATIVE: D-Dimer, Quant: 0.39 ug{FEU}/mL (ref 0.00–0.50)

## 2023-12-17 LAB — PROCALCITONIN: Procalcitonin: <0.1 ng/mL

## 2023-12-17 LAB — CREATININE, SERUM
Creatinine, Ser: 0.59 mg/dL — ABNORMAL LOW (ref 0.61–1.24)
GFR, Estimated: >60 mL/min

## 2023-12-17 MED ORDER — ONDANSETRON HCL 4 MG/2ML IJ SOLN: 4.0000 mg | Freq: Four times a day (QID) | INTRAMUSCULAR | Status: DC | PRN

## 2023-12-17 MED ORDER — MELATONIN 5 MG PO TABS
5.0000 mg | ORAL_TABLET | Freq: Every evening | ORAL | Status: DC | PRN
  Administered 2023-12-17: 5 mg via ORAL
  Filled 2023-12-17: qty 1

## 2023-12-17 MED ORDER — ACETAMINOPHEN 325 MG PO TABS
650.0000 mg | ORAL_TABLET | Freq: Three times a day (TID) | ORAL | Status: DC
  Administered 2023-12-17 – 2023-12-18 (×5): 650 mg via ORAL
  Filled 2023-12-17 (×6): qty 2

## 2023-12-17 MED ORDER — POLYVINYL ALCOHOL 1.4 % OP SOLN: 1.0000 [drp] | Freq: Four times a day (QID) | OPHTHALMIC | Status: DC | PRN

## 2023-12-17 MED ORDER — ACETAMINOPHEN 325 MG PO TABS: 650.0000 mg | ORAL_TABLET | Freq: Four times a day (QID) | ORAL | Status: DC | PRN

## 2023-12-17 MED ORDER — IOHEXOL 350 MG/ML SOLN: 75.0000 mL | Freq: Once | INTRAVENOUS | Status: DC | PRN

## 2023-12-17 MED ORDER — ACETAMINOPHEN 325 MG RE SUPP: 650.0000 mg | Freq: Four times a day (QID) | RECTAL | Status: DC | PRN

## 2023-12-17 MED ORDER — ALBUTEROL SULFATE (2.5 MG/3ML) 0.083% IN NEBU: 2.5000 mg | INHALATION_SOLUTION | RESPIRATORY_TRACT | Status: DC | PRN

## 2023-12-17 MED ORDER — METHYLPREDNISOLONE SODIUM SUCC 40 MG IJ SOLR
40.0000 mg | Freq: Two times a day (BID) | INTRAMUSCULAR | Status: DC
  Administered 2023-12-17: 40 mg via INTRAVENOUS
  Filled 2023-12-17: qty 1

## 2023-12-17 MED ORDER — LEVOFLOXACIN 500 MG PO TABS: 750.0000 mg | ORAL_TABLET | Freq: Every day | ORAL | Status: DC

## 2023-12-17 MED ORDER — GLYCOPYRROLATE 0.2 MG/ML IJ SOLN
0.2000 mg | INTRAMUSCULAR | Status: DC | PRN
  Administered 2023-12-19: 0.2 mg via INTRAVENOUS
  Filled 2023-12-17: qty 1

## 2023-12-17 MED ORDER — MORPHINE SULFATE (PF) 2 MG/ML IV SOLN
1.0000 mg | INTRAVENOUS | Status: DC | PRN
  Administered 2023-12-17: 2 mg via INTRAVENOUS
  Filled 2023-12-17: qty 1

## 2023-12-17 MED ORDER — PREDNISONE 20 MG PO TABS: 40.0000 mg | ORAL_TABLET | Freq: Every day | ORAL | Status: DC

## 2023-12-17 MED ORDER — GLYCOPYRROLATE 1 MG PO TABS: 1.0000 mg | ORAL_TABLET | ORAL | Status: DC | PRN

## 2023-12-17 MED ORDER — DIPHENHYDRAMINE HCL 50 MG/ML IJ SOLN: 25.0000 mg | INTRAMUSCULAR | Status: DC | PRN

## 2023-12-17 MED ORDER — LORAZEPAM 2 MG/ML IJ SOLN
2.0000 mg | INTRAMUSCULAR | Status: DC | PRN
  Administered 2023-12-18: 1 mg via INTRAVENOUS
  Administered 2023-12-19: 2 mg via INTRAVENOUS
  Filled 2023-12-17 (×2): qty 1

## 2023-12-17 MED ORDER — ONDANSETRON HCL 4 MG PO TABS: 4.0000 mg | ORAL_TABLET | Freq: Four times a day (QID) | ORAL | Status: DC | PRN

## 2023-12-17 MED ORDER — SODIUM CHLORIDE 0.9% FLUSH
3.0000 mL | Freq: Two times a day (BID) | INTRAVENOUS | Status: DC
  Administered 2023-12-17: 10 mL via INTRAVENOUS
  Administered 2023-12-17: 3 mL via INTRAVENOUS
  Administered 2023-12-18 (×2): 10 mL via INTRAVENOUS

## 2023-12-17 MED ORDER — BUDESONIDE 0.5 MG/2ML IN SUSP
0.5000 mg | Freq: Two times a day (BID) | RESPIRATORY_TRACT | Status: DC
  Administered 2023-12-17: 0.5 mg via RESPIRATORY_TRACT
  Filled 2023-12-17: qty 2

## 2023-12-17 MED ORDER — MORPHINE SULFATE (PF) 2 MG/ML IV SOLN
2.0000 mg | INTRAVENOUS | Status: DC | PRN
  Administered 2023-12-19 (×2): 2 mg via INTRAVENOUS
  Filled 2023-12-17 (×2): qty 1

## 2023-12-17 MED ORDER — IPRATROPIUM-ALBUTEROL 0.5-2.5 (3) MG/3ML IN SOLN
3.0000 mL | RESPIRATORY_TRACT | Status: AC
  Administered 2023-12-17 (×2): 3 mL via RESPIRATORY_TRACT
  Filled 2023-12-17: qty 6

## 2023-12-17 MED ORDER — OXYCODONE HCL 5 MG PO TABS: 5.0000 mg | ORAL_TABLET | Freq: Four times a day (QID) | ORAL | Status: DC | PRN

## 2023-12-17 MED ORDER — LEVOFLOXACIN IN D5W 750 MG/150ML IV SOLN
750.0000 mg | Freq: Once | INTRAVENOUS | Status: AC
  Administered 2023-12-17: 750 mg via INTRAVENOUS
  Filled 2023-12-17: qty 150

## 2023-12-17 MED ORDER — IPRATROPIUM-ALBUTEROL 0.5-2.5 (3) MG/3ML IN SOLN
3.0000 mL | Freq: Four times a day (QID) | RESPIRATORY_TRACT | Status: DC
  Administered 2023-12-17: 3 mL via RESPIRATORY_TRACT
  Filled 2023-12-17: qty 3

## 2023-12-17 MED ORDER — ACETAMINOPHEN 650 MG RE SUPP: 650.0000 mg | Freq: Three times a day (TID) | RECTAL | Status: DC

## 2023-12-17 MED ORDER — ALBUTEROL SULFATE (2.5 MG/3ML) 0.083% IN NEBU
5.0000 mg | INHALATION_SOLUTION | Freq: Once | RESPIRATORY_TRACT | Status: AC
  Administered 2023-12-17: 5 mg via RESPIRATORY_TRACT
  Filled 2023-12-17: qty 6

## 2023-12-17 MED ORDER — GLYCOPYRROLATE 0.2 MG/ML IJ SOLN: 0.2000 mg | INTRAMUSCULAR | Status: DC | PRN

## 2023-12-17 MED ORDER — HEPARIN SODIUM (PORCINE) 5000 UNIT/ML IJ SOLN
5000.0000 [IU] | Freq: Three times a day (TID) | INTRAMUSCULAR | Status: DC
  Administered 2023-12-17: 5000 [IU] via SUBCUTANEOUS
  Filled 2023-12-17: qty 1

## 2023-12-17 MED ORDER — SODIUM CHLORIDE 0.9% FLUSH: 3.0000 mL | INTRAVENOUS | Status: DC | PRN

## 2023-12-17 NOTE — ED Triage Notes (Addendum)
 Pt to ED via [facility] from home c/o SOB that started about an hr ago. Pt on 5L baseline. On fire department arrival, pt O2 sat was 80% on 5L. Was placed on 15L NRB,, O2 sat 100%. EMS gave 125mg  solumedrol, duoneb, and albuterol . Pt denies CP, cough, fevers, dizziness. HX of stage 4 lung cancer, copd, and emphysema

## 2023-12-17 NOTE — Progress Notes (Signed)
   12/17/23 1445  Spiritual Encounters  Type of Visit Initial  Care provided to: Family;Friend  Conversation partners present during encounter Other (comment) (Palliative care NP)  Referral source Physician  Reason for visit End-of-life  Interventions  Spiritual Care Interventions Made Established relationship of care and support;Compassionate presence  Intervention Outcomes  Outcomes Connection to spiritual care;Awareness around self/spiritual resourses  Spiritual Care Plan  Spiritual Care Issues Still Outstanding Referring to oncoming chaplain for further support   Chaplain spoke with family, NP, and the patient's pastors. Made sure the family understands chaplain services is available 24/7

## 2023-12-17 NOTE — Consult Note (Signed)
 NAME:  Cody [name], MRN:  [id number], DOB:  1956-05-09, LOS: 0 ADMISSION DATE:  12/17/2023, CONSULTATION DATE:  12/17/23 REFERRING MD:  Dr. Andy Bannister, CHIEF COMPLAINT:  Shortness of Breath   Brief Pt Description / Synopsis:  68 y.o. 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  High risk for intubation.  History of Present Illness:  Cody [name] is a 68 y.o. male with medical history significant for Severe COPD, chronic hypoxic respiratory failure requiring 4-5 L supplemental oxygen  at baseline, Stage IV Lung cancer s/p lobectomy with mets to the spine and liver,  BPH with LUTS, hypothyroidism who presented to [facility] ED on 12/17/2023  with complaint of acute onset of shortness of breath approximately 1 hour prior to presentation this morning.  Upon EMS arrival patient was hypoxic with O2 saturations in the 80's on his chronic 5L, of which he was subsequently transitioned to NRB and given 125mg  solumedrol , as well dounebs. Patient and his wife deny fever, chills, chest pain, palpitations, edema, cough, sputum production, abdominal pain, N/V/D, dysuria, history of DVT or PE, or any know sick contacts.  Given his work of breathing, he was transitioned over to BiPAP in the ED.  ED Course: Initial Vital Signs: Temperature 98.9 F orally, pulse 87, respiratory rate 22, blood pressure 121/69, SpO2 100% on nonrebreather Significant Labs:  Bicarb >45, glucose 114, HS troponin 10, WBC 4.7, Platelets 120, procalcitonin <0.10, D dimer 0.39 COVID/FLU/RSV PCR is negative Imaging Chest X-ray>>FINDINGS: Porta catheter with tip at the upper cavoatrial junction. Hyperinflation and biapical reticulation asymmetric to the right upper chest, advanced emphysema and right apical scarring. There is no  edema, consolidation, effusion, or pneumothorax. Normal heart size and mediastinal contours. Cervicothoracic fusion hardware. CTa Chest>> Medications Administered: Duonebs, Levaquin  TRH asked to admit for further workup and treatment, with PCCM asked to see in Pulmonary Consultation.  Please see "Significant Hospital Events" section below for full detailed hospital course.   Pertinent  Medical History   Past Medical History:  Diagnosis Date   Allergy    Cancer of upper lobe of right lung (HCC) 10/2019   COPD (chronic obstructive pulmonary disease) (HCC)    Prostate enlargement      Micro Data:  5/22: COVID/FLU/RSV PCR>> negative 5/22: RVP >> 5/22: Sputum>> 5/22: Strep pneumo & Legionella urinary antigens>>  Antimicrobials:   Anti-infectives (From admission, onward)    Start     Dose/Rate Route Frequency Ordered Stop   12/18/23 1000  levofloxacin (LEVAQUIN) tablet 750 mg        750 mg Oral Daily 12/17/23 0801 12/22/23 0959   12/17/23 0815  levofloxacin (LEVAQUIN) IVPB 750 mg        750 mg 100 mL/hr over 90 Minutes Intravenous  Once 12/17/23 0801 12/17/23 1046       Significant Hospital Events: Including procedures, antibiotic start and stop dates in addition to other pertinent events   5/22: Presented to ED with SOB/respiratory distress and hypoxia requiring BiPAP.  TRH admitted, PCCM asked to see for Pulmonary consultation given high risk for intubation.  Later in afternoon, pt and his wife request they would like to transition to COMFORT MEASURES, Palliative Care consulted.  Interim History / Subjective:  As outlined above under "Significant Hospital Events" section  Objective  Blood pressure 127/87, pulse (!) 103, temperature 98.7 F (37.1 C), resp. rate 15, height 5\' 9"  (1.753 m), weight 54.4 kg, SpO2 98%.    FiO2 (%):  [50 %] 50 %  No intake or output data in the 24 hours ending 12/17/23 1102 Filed Weights   12/17/23 0448  Weight: 54.4 kg     Examination: General: Acute on chronically ill appearing frail elderly male, sitting in bed, on BiPAP with moderate respiratory distress HENT: Atraumatic, normocephalic, neck supple, no JVD Lungs: Clear diminished breath sounds throughout, BiPAP assisted, tachypnea with assessory muscle use Cardiovascular: RRR, s1s2, no M/R/G Abdomen: Soft, nontender, nondistended, no guarding or rebound tenderness Extremities: Normal bulk and tone, no deformitites, no edema Neuro: Awake and alert, anxious, oriented x4, moves all extremities to commands, no focal deficits, speech clear, PERRL GU: Deferrred  Resolved Hospital Problem list     Assessment & Plan:   #Acute on Chronic Hypoxic Respiratory Failure due to ... #Acute COPD Exacerbation #Stage IV Lung cancer s/p Lobectomy with mets to spine and liver PMHx: Severe COPD requiring 4-5 L supplemental O2 at baseline -Supplemental O2 as needed to maintain O2 sats 88 to 92% -BiPAP, wean as tolerated ~ HIGH RISK FOR INTUBATION -Follow intermittent Chest X-ray & ABG as needed -Bronchodilators & Pulmicort nebs -IV Steroids -ABX as above for severe COPD Exacerbation -Pulmonary toilet as able -Obtain CTa Chest to rule out PE -Prn Morphine for air hunger -Palliative Care consulted, appreciate input      Pt is critically ill with multiorgan failure. Prognosis is guarded, high risk for further decompensation, cardiac arrest, and death.  Given current critical illness superimposed on multiple chronic co-morbidities and advanced age, overall long term prognosis is poor.  Recommend consideration of DNR/DNI status.  Will consult Palliative Care to assist with GOC discussions.   Best Practice (right click and "Reselect all SmartList Selections" daily)   Diet/type: NPO until respiratory status improved DVT prophylaxis: prophylactic heparin   GI prophylaxis: N/A Lines: N/A Foley:  N/A Code Status:  DNR/DNI Last date of multidisciplinary goals of care  discussion [N/A]  5/22: Pt and his wife updated at bedside on plan of care. They confirm Full Code status for now.  Later in the day patient and his wife request to transition to COMFORT MEASURES ONLY.    Labs   CBC: Recent Labs  Lab 12/17/23 0452 12/17/23 0812  WBC 4.7 4.3  NEUTROABS 3.0  --   HGB 12.3* 11.3*  HCT 41.1 37.6*  MCV 99.3 99.7  PLT 120* 112*    Basic Metabolic Panel: Recent Labs  Lab 12/17/23 0452 12/17/23 0812  NA 143  --   K 4.7  --   CL 84*  --   CO2 >45*  --   GLUCOSE 114*  --   BUN 17  --   CREATININE 0.52* 0.59*  CALCIUM 9.2  --    GFR: Estimated Creatinine Clearance: 67.1 mL/min (A) (by C-G formula based on SCr of 0.59 mg/dL (L)). Recent Labs  Lab 12/17/23 0452 12/17/23 0812  WBC 4.7 4.3    Liver Function Tests: No results for input(s): "AST", "ALT", "ALKPHOS", "BILITOT", "PROT", "ALBUMIN" in the last 168 hours. No results for input(s): "LIPASE", "AMYLASE" in the last 168 hours. No results for input(s): "AMMONIA" in the last 168 hours.  ABG No results found for: "PHART", "PCO2ART", "PO2ART", "HCO3", "TCO2", "ACIDBASEDEF", "O2SAT"   Coagulation Profile: No results for input(s): "INR", "PROTIME" in the last 168 hours.  Cardiac Enzymes:  No results for input(s): "CKTOTAL", "CKMB", "CKMBINDEX", "TROPONINI" in the last 168 hours.  HbA1C: Hemoglobin A1C  Date/Time Value Ref Range Status  04/10/2020 11:44 AM 5.8 (A) 4.0 - 5.6 % Final   Hgb A1c MFr Bld  Date/Time Value Ref Range Status  10/21/2019 03:17 PM 6.1 (H) 4.8 - 5.6 % Final    Comment:             Prediabetes: 5.7 - 6.4          Diabetes: >6.4          Glycemic control for adults with diabetes: <7.0   03/16/2017 12:11 PM 5.7 (H) <5.7 % Final    Comment:      For someone without known diabetes, a hemoglobin A1c value between 5.7% and 6.4% is consistent with prediabetes and should be confirmed with a follow-up test.   For someone with known diabetes, a value <7% indicates  that their diabetes is well controlled. A1c targets should be individualized based on duration of diabetes, age, co-morbid conditions and other considerations.   This assay result is consistent with an increased risk of diabetes.   Currently, no consensus exists regarding use of hemoglobin A1c for diagnosis of diabetes in children.       CBG: No results for input(s): "GLUCAP" in the last 168 hours.  Review of Systems:   Positives in BOLD: Gen: Denies fever, chills, weight change, fatigue, night sweats HEENT: Denies blurred vision, double vision, hearing loss, tinnitus, sinus congestion, rhinorrhea, sore throat, neck stiffness, dysphagia PULM: Denies shortness of breath, cough, sputum production, hemoptysis, wheezing CV: Denies chest pain, edema, orthopnea, paroxysmal nocturnal dyspnea, palpitations GI: Denies abdominal pain, nausea, vomiting, diarrhea, hematochezia, melena, constipation, change in bowel habits GU: Denies dysuria, hematuria, polyuria, oliguria, urethral discharge Endocrine: Denies hot or cold intolerance, polyuria, polyphagia or appetite change Derm: Denies rash, dry skin, scaling or peeling skin change Heme: Denies easy bruising, bleeding, bleeding gums Neuro: Denies headache, numbness, weakness, slurred speech, loss of memory or consciousness   Past Medical History:  He,  has a past medical history of Allergy, Cancer of upper lobe of right lung (HCC) (10/2019), COPD (chronic obstructive pulmonary disease) (HCC), and Prostate enlargement.   Surgical History:   Past Surgical History:  Procedure Laterality Date   HERNIA REPAIR Bilateral 1982   HERNIA REPAIR Right 1994   IR IMAGING GUIDED PORT INSERTION  02/24/2020   LAMINECTOMY FOR EXCISION / EVACUATION INTRASPINAL LESION  11/07/2019   T1-T 6 at [facility]      Social History:   reports that he quit smoking about 12 years ago. His smoking use included cigarettes. He started smoking about 42 years ago. He has a 60  pack-year smoking history. He has never used smokeless tobacco. He reports current alcohol use of about 5.0 standard drinks of alcohol per week. He reports that he does not use drugs.   Family History:  His family history includes CVA in his mother; Heart disease in his father; Lung cancer in his brother and sister.   Allergies No Known Allergies   Home Medications  Prior to Admission medications   Medication Sig Start Date End Date Taking? Authorizing Provider  acetaminophen  (TYLENOL ) 500 MG tablet Take 500-1,000 mg by mouth every 6 (six) hours as needed for mild pain or fever.    Yes [provider]  albuterol  (VENTOLIN  HFA) 108 (90 Base) MCG/ACT inhaler Inhale 2 puffs into the lungs every 4 (four) hours as needed for wheezing or  shortness of breath. 10/12/23  Yes Sowles, Krichna, MD  arformoterol  (BROVANA ) 15 MCG/2ML NEBU Take 2 mLs (15 mcg total) by nebulization 2 (two) times daily. 10/15/23  Yes Assaker, Marianne Shirts, MD  Calcium Carb-Cholecalciferol (OYSTER SHELL CALCIUM) 500-400 MG-UNIT TABS Take 500 tablets by mouth.   Yes [provider]  escitalopram  (LEXAPRO ) 5 MG tablet Take 1 tablet (5 mg total) by mouth every morning. 05/21/23  Yes Sowles, Krichna, MD  finasteride  (PROSCAR ) 5 MG tablet TAKE 1 TABLET BY MOUTH DAILY 12/10/23  Yes Dustin Gimenez, MD  fluticasone  (FLONASE ) 50 MCG/ACT nasal spray Place 1 spray into both nostrils daily. 10/15/23 10/14/24 Yes Assaker, Marianne Shirts, MD  levothyroxine  (SYNTHROID ) 50 MCG tablet TAKE 1 TABLET BY MOUTH DAILY BEFORE BREAKFAST 06/22/23  Yes Brahmanday, Govinda R, MD  Multiple Vitamin (MULTIVITAMIN) tablet Take 1 tablet by mouth daily.   Yes [provider]  pravastatin  (PRAVACHOL ) 20 MG tablet Take 1 tablet (20 mg total) by mouth daily. 12/01/23  Yes Sowles, Krichna, MD  QUEtiapine  (SEROQUEL ) 25 MG tablet Take 1 tablet (25 mg total) by mouth at bedtime. 12/01/23  Yes Sowles, Krichna, MD  tamsulosin  (FLOMAX ) 0.4 MG CAPS capsule  TAKE 1 CAPSULE BY MOUTH DAILY 12/10/23  Yes Dustin Gimenez, MD  chlorhexidine  (PERIDEX ) 0.12 % solution Use as directed 15 mLs in the mouth or throat 2 (two) times daily. Patient not taking: Reported on 12/17/2023 09/25/23   Brahmanday, Govinda R, MD  ipratropium-albuterol  (DUONEB) 0.5-2.5 (3) MG/3ML SOLN Take 3 mLs by nebulization every 6 (six) hours. Patient not taking: Reported on 12/17/2023 07/02/23   Annitta Kindler, MD  Loratadine  10 MG CAPS  10/31/22   [provider]  traMADol (ULTRAM) 50 MG tablet Take 50 mg by mouth 4 (four) times daily. Patient not taking: Reported on 12/17/2023 11/02/23   [provider]     Critical care time: 50 minutes     Cherylann Corpus, AGACNP-BC [facility] Prefer epic messenger for cross cover needs If after hours, please call E-link

## 2023-12-17 NOTE — ED Provider Notes (Addendum)
 [facility] Provider Note    Event Date/Time   First MD Initiated Contact with Patient 12/17/23 [phone]     (approximate)   History   Shortness of Breath   HPI  Cody [name] is a 68 y.o. male with history of COPD, lung cancer status postchemotherapy who presents to the emergency department with shortness of breath.  Wears 5 L of oxygen  at baseline.  Found to be satting 80% on 5 L on fire department arrival.  Given 1 albuterol  treatment, 1 DuoNeb and 125 mg of IV Solu-Medrol  patient reports feeling better.  Now doing well on his normal 5 L nasal cannula.  Denies any fever, productive cough, lower extremity swelling or pain.  Denies history of PE or DVT.  Denies history of CHF.  Did have some tightness in the chest.   History provided by patient, EMS.    Past Medical History:  Diagnosis Date   Allergy    Cancer of upper lobe of right lung (HCC) 10/2019   COPD (chronic obstructive pulmonary disease) (HCC)    Prostate enlargement     Past Surgical History:  Procedure Laterality Date   HERNIA REPAIR Bilateral 1982   HERNIA REPAIR Right 1994   IR IMAGING GUIDED PORT INSERTION  02/24/2020   LAMINECTOMY FOR EXCISION / EVACUATION INTRASPINAL LESION  11/07/2019   T1-T 6 at [facility]     MEDICATIONS:  Prior to Admission medications   Medication Sig Start Date End Date Taking? Authorizing Provider  acetaminophen  (TYLENOL ) 500 MG tablet Take 500-1,000 mg by mouth every 6 (six) hours as needed for mild pain or fever.     [provider]  albuterol  (VENTOLIN  HFA) 108 (90 Base) MCG/ACT inhaler Inhale 2 puffs into the lungs every 4 (four) hours as needed for wheezing or shortness of breath. 10/12/23   Sowles, Krichna, MD  arformoterol  (BROVANA ) 15 MCG/2ML NEBU Take 2 mLs (15 mcg total) by nebulization 2 (two) times daily. 10/15/23   Assaker, Marianne Shirts, MD  Calcium Carb-Cholecalciferol (OYSTER SHELL CALCIUM) 500-400 MG-UNIT TABS Take 500 tablets by mouth.     [provider]  chlorhexidine  (PERIDEX ) 0.12 % solution Use as directed 15 mLs in the mouth or throat 2 (two) times daily. 09/25/23   Brahmanday, Govinda R, MD  escitalopram  (LEXAPRO ) 5 MG tablet Take 1 tablet (5 mg total) by mouth every morning. 05/21/23   Sowles, Krichna, MD  finasteride  (PROSCAR ) 5 MG tablet TAKE 1 TABLET BY MOUTH DAILY 12/10/23   Dustin Gimenez, MD  fluticasone  (FLONASE ) 50 MCG/ACT nasal spray Place 1 spray into both nostrils daily. 10/15/23 10/14/24  Assaker, Marianne Shirts, MD  ipratropium-albuterol  (DUONEB) 0.5-2.5 (3) MG/3ML SOLN Take 3 mLs by nebulization every 6 (six) hours. 07/02/23   Assaker, Marianne Shirts, MD  levothyroxine  (SYNTHROID ) 50 MCG tablet TAKE 1 TABLET BY MOUTH DAILY BEFORE BREAKFAST 06/22/23   Brahmanday, Govinda R, MD  Loratadine  10 MG CAPS  10/31/22   [provider]  Multiple Vitamin (MULTIVITAMIN) tablet Take 1 tablet by mouth daily.    [provider]  pravastatin  (PRAVACHOL ) 20 MG tablet Take 1 tablet (20 mg total) by mouth daily. 12/01/23   Sowles, Krichna, MD  QUEtiapine  (SEROQUEL ) 25 MG tablet Take 1 tablet (25 mg total) by mouth at bedtime. 12/01/23   Sowles, Krichna, MD  tamsulosin  (FLOMAX ) 0.4 MG CAPS capsule TAKE 1 CAPSULE BY MOUTH DAILY 12/10/23   Dustin Gimenez, MD    Physical Exam   Triage Vital Signs: ED Triage Vitals  Encounter Vitals Group     BP 12/17/23 0453 121/69     Systolic BP Percentile --      Diastolic BP Percentile --      Pulse Rate 12/17/23 0453 87     Resp 12/17/23 0453 (!) 22     Temp 12/17/23 0453 98.9 F (37.2 C)     Temp Source 12/17/23 0453 Oral     SpO2 12/17/23 0453 100 %     Weight 12/17/23 0448 120 lb (54.4 kg)     Height 12/17/23 0448 5\' 9"  (1.753 m)     Head Circumference --      Peak Flow --      Pain Score 12/17/23 0448 0     Pain Loc --      Pain Education --      Exclude from Growth Chart --     Most recent vital signs: Vitals:   12/17/23 0453 12/17/23 0500  BP: 121/69 119/72   Pulse: 87 95  Resp: (!) 22 (!) 21  Temp: 98.9 F (37.2 C)   SpO2: 100% 96%    CONSTITUTIONAL: Alert, responds appropriately to questions.  Chronically ill-appearing.  In no distress currently. HEAD: Normocephalic, atraumatic EYES: Conjunctivae clear, pupils appear equal, sclera nonicteric ENT: normal nose; moist mucous membranes NECK: Supple, normal ROM CARD: RRR; S1 and S2 appreciated RESP: Patient has mild tachypnea.  Speaking full sentences.  Satting 100% on 5 L nasal cannula.  Diminished aeration diffusely.  No wheezing, rhonchi or rales appreciated. ABD/GI: Non-distended; soft, non-tender, no rebound, no guarding, no peritoneal signs BACK: The back appears normal EXT: Normal ROM in all joints; no deformity noted, no edema, no calf tenderness or calf swelling SKIN: Normal color for age and race; warm; no rash on exposed skin NEURO: Moves all extremities equally, normal speech PSYCH: The patient's mood and manner are appropriate.   ED Results / Procedures / Treatments   LABS: (all labs ordered are listed, but only abnormal results are displayed) Labs Reviewed  CBC WITH DIFFERENTIAL/PLATELET - Abnormal; Notable for the following components:      Result Value   RBC 4.14 (*)    Hemoglobin 12.3 (*)    MCHC 29.9 (*)    Platelets 120 (*)    All other components within normal limits  BASIC METABOLIC PANEL WITH GFR - Abnormal; Notable for the following components:   Chloride 84 (*)    CO2 >45 (*)    Glucose, Bld 114 (*)    Creatinine, Ser 0.52 (*)    All other components within normal limits  RESP PANEL BY RT-PCR (RSV, FLU A&B, COVID)  RVPGX2  D-DIMER, QUANTITATIVE  TROPONIN I (HIGH SENSITIVITY)  TROPONIN I (HIGH SENSITIVITY)     EKG:  EKG Interpretation Date/Time:  Thursday Dec 17 2023 04:56:22 EDT Ventricular Rate:  86 PR Interval:  136 QRS Duration:  143 QT Interval:  386 QTC Calculation: 462 R Axis:   95  Text Interpretation: Sinus rhythm Right atrial  enlargement RBBB and LPFB Baseline wander in lead(s) V3 No significant change since last tracing Confirmed by Verneda Golder [phone]) on 12/17/2023 5:07:24 AM         RADIOLOGY: My personal review and interpretation of imaging: Chest x-ray clear.  I have personally reviewed all radiology reports.   DG Chest Portable 1 View Result Date: 12/17/2023 CLINICAL DATA:  Shortness of breath EXAM: PORTABLE CHEST 1 VIEW COMPARISON:  11/26/2023 FINDINGS: Porta catheter with tip at the upper cavoatrial junction.  Hyperinflation and biapical reticulation asymmetric to the right upper chest, advanced emphysema and right apical scarring. There is no edema, consolidation, effusion, or pneumothorax. Normal heart size and mediastinal contours. Cervicothoracic fusion hardware. IMPRESSION: Chronic lung disease including right upper lobe scarring and advanced COPD. Electronically Signed   By: Ronnette Coke M.D.   On: 12/17/2023 05:51     PROCEDURES:  Critical Care performed: Yes, see critical care procedure note(s)   CRITICAL CARE Performed by: Starling Eck Tyra Michelle   Total critical care time: 30 minutes  Critical care time was exclusive of separately billable procedures and treating other patients.  Critical care was necessary to treat or prevent imminent or life-threatening deterioration.  Critical care was time spent personally by me on the following activities: development of treatment plan with patient and/or surrogate as well as nursing, discussions with consultants, evaluation of patient's response to treatment, examination of patient, obtaining history from patient or surrogate, ordering and performing treatments and interventions, ordering and review of laboratory studies, ordering and review of radiographic studies, pulse oximetry and re-evaluation of patient's condition.   Aaron Aas1-3 Lead EKG Interpretation  Performed by: Jamahl Lemmons, Clover Dao, DO Authorized by: Ardean Simonich, Clover Dao, DO     Interpretation: normal      ECG rate:  87   ECG rate assessment: normal     Rhythm: sinus rhythm     Ectopy: none     Conduction: normal       IMPRESSION / MDM / ASSESSMENT AND PLAN / ED COURSE  I reviewed the triage vital signs and the nursing notes.    Patient here with shortness of breath, chest tightness.  The patient is on the cardiac monitor to evaluate for evidence of arrhythmia and/or significant heart rate changes.   DIFFERENTIAL DIAGNOSIS (includes but not limited to):   COPD exacerbation, pneumonia, viral URI, CHF, ACS, PE, symptomatic anemia, pneumothorax   Patient's presentation is most consistent with acute presentation with potential threat to life or bodily function.   PLAN: Will obtain labs including troponin x 2 to rule out ACS, BNP to rule out CHF, COVID and flu swabs to rule out viral illness, chest x-ray to rule out pneumonia, pneumothorax, edema.  Will give breathing treatments and continue to closely monitor.  EKG shows no new ischemic change.   MEDICATIONS GIVEN IN ED: Medications  albuterol  (PROVENTIL ) (2.5 MG/3ML) 0.083% nebulizer solution 5 mg (has no administration in time range)  ipratropium-albuterol  (DUONEB) 0.5-2.5 (3) MG/3ML nebulizer solution 3 mL (3 mLs Nebulization Given 12/17/23 0501)     ED COURSE: Labs show normal hemoglobin.  Negative troponin.  Negative D-dimer.  Chest x-ray reviewed and interpreted by myself and the radiologist and shows chronic scarring and changes consistent with COPD but no edema, infiltrate.  COVID, flu and RSV negative.  Will obtain second troponin given chest tightness.   6:47 AM  Pt's sats back down into the low 80s on his normal 5 L.  Had to be placed back on nonrebreather due to hypoxia.  Will place on BiPAP due to hypoxia.  No significant increased work of breathing at this time.  Will discuss with hospitalist for admission.  Wife at bedside updated.   CONSULTS: Consulted and discussed patient's case with hospitalist.  I have  recommended admission and consulting physician agrees and will place admission orders.  Patient (and family if present) agree with this plan.   I reviewed all nursing notes, vitals, pertinent previous records.  All labs, EKGs, imaging ordered have been  independently reviewed and interpreted by myself.    OUTSIDE RECORDS REVIEWED: Reviewed last internal medicine note on 12/01/2023.       FINAL CLINICAL IMPRESSION(S) / ED DIAGNOSES   Final diagnoses:  COPD exacerbation (HCC)  Acute on chronic respiratory failure with hypoxia (HCC)     Rx / DC Orders   ED Discharge Orders     None        Note:  This document was prepared using Dragon voice recognition software and may include unintentional dictation errors.   Steele Stracener, Clover Dao, DO 12/17/23 0600    Ryaan Vanwagoner, Clover Dao, DO 12/17/23 [phone]

## 2023-12-17 NOTE — H&P (Addendum)
 History and Physical    Cody [name] WUJ:[id number] DOB: Apr 04, 1956 DOA: 12/17/2023  PCP: Sowles, Krichna, MD  Patient coming from: home  I have personally briefly reviewed patient's old medical records in [facility]  Chief Complaint: sob acute x 1 hour   HPI: Cody [name] is a 68 y.o. male with medical history significant of  BPH with LUTS,hypothyroidism,Severe COPD , Stage IV lung ca  s/p lobectomy with mets to the spine and liveron chronic home O2 of 5L who presents to ED BIB EMS with complaint of acute sob x 1 hour. Of note in the field  patient was hyooxic to 80's on his chronic 5L. Patient was then transitioned to NRB and given 125mg  solumedrol , as well dounebs.   ED Course:  Patient on arrival to ED with improved sat on NRB transitioned to [place] but noted intermittent desat so was transitioned to bipap . Patient has stable sat on bipap. CXR NAD .  Patient slated for admission for Acute COPD exacerbation with acute on chronic hypoxic respiratory failure.  Labs: wbc 4.7, hgb 12.3 (13.1), plt 120 D-dimer:0.39 Ce 10 Na 143, K 4.7, CL 84, bicarhb 45, cr 0.52  EKG: NRS, RAE, RBBB/ LPFB RVP: Neg Review of Systems: As per HPI otherwise 10 point review of systems negative.   Past Medical History:  Diagnosis Date   Allergy    Cancer of upper lobe of right lung (HCC) 10/2019   COPD (chronic obstructive pulmonary disease) (HCC)    Prostate enlargement     Past Surgical History:  Procedure Laterality Date   HERNIA REPAIR Bilateral 1982   HERNIA REPAIR Right 1994   IR IMAGING GUIDED PORT INSERTION  02/24/2020   LAMINECTOMY FOR EXCISION / EVACUATION INTRASPINAL LESION  11/07/2019   T1-T 6 at [facility]      reports that he quit smoking about 12 years ago. His smoking use included cigarettes. He started smoking about 42 years ago. He has a 60 pack-year smoking history. He has never used smokeless tobacco. He reports current alcohol use of about 5.0 standard drinks of alcohol per  week. He reports that he does not use drugs.  No Known Allergies  Family History  Problem Relation Age of Onset   Heart disease Father    CVA Mother    Lung cancer Sister    Lung cancer Brother     Prior to Admission medications   Medication Sig Start Date End Date Taking? Authorizing Provider  acetaminophen  (TYLENOL ) 500 MG tablet Take 500-1,000 mg by mouth every 6 (six) hours as needed for mild pain or fever.     [provider]  albuterol  (VENTOLIN  HFA) 108 (90 Base) MCG/ACT inhaler Inhale 2 puffs into the lungs every 4 (four) hours as needed for wheezing or shortness of breath. 10/12/23   Sowles, Krichna, MD  arformoterol  (BROVANA ) 15 MCG/2ML NEBU Take 2 mLs (15 mcg total) by nebulization 2 (two) times daily. 10/15/23   Assaker, Marianne Shirts, MD  Calcium Carb-Cholecalciferol (OYSTER SHELL CALCIUM) 500-400 MG-UNIT TABS Take 500 tablets by mouth.    [provider]  chlorhexidine  (PERIDEX ) 0.12 % solution Use as directed 15 mLs in the mouth or throat 2 (two) times daily. 09/25/23   Brahmanday, Govinda R, MD  escitalopram  (LEXAPRO ) 5 MG tablet Take 1 tablet (5 mg total) by mouth every morning. 05/21/23   Sowles, Krichna, MD  finasteride  (PROSCAR ) 5 MG tablet TAKE 1 TABLET BY MOUTH DAILY 12/10/23   Dustin Gimenez, MD  fluticasone  (FLONASE ) 50 MCG/ACT nasal spray Place 1 spray into both nostrils daily. 10/15/23 10/14/24  Assaker, Marianne Shirts, MD  ipratropium-albuterol  (DUONEB) 0.5-2.5 (3) MG/3ML SOLN Take 3 mLs by nebulization every 6 (six) hours. 07/02/23   Assaker, Marianne Shirts, MD  levothyroxine  (SYNTHROID ) 50 MCG tablet TAKE 1 TABLET BY MOUTH DAILY BEFORE BREAKFAST 06/22/23   Brahmanday, Govinda R, MD  Loratadine  10 MG CAPS  10/31/22   [provider]  Multiple Vitamin (MULTIVITAMIN) tablet Take 1 tablet by mouth daily.    [provider]  pravastatin  (PRAVACHOL ) 20 MG tablet Take 1 tablet (20 mg total) by mouth daily. 12/01/23   Sowles, Krichna, MD  QUEtiapine   (SEROQUEL ) 25 MG tablet Take 1 tablet (25 mg total) by mouth at bedtime. 12/01/23   Sowles, Krichna, MD  tamsulosin  (FLOMAX ) 0.4 MG CAPS capsule TAKE 1 CAPSULE BY MOUTH DAILY 12/10/23   Dustin Gimenez, MD    Physical Exam: Vitals:   12/17/23 0453 12/17/23 0500 12/17/23 0704 12/17/23 0719  BP: 121/69 119/72    Pulse: 87 95 84   Resp: (!) 22 (!) 21 11   Temp: 98.9 F (37.2 C)     TempSrc: Oral     SpO2: 100% 96% 100% 100%  Weight:      Height:        Constitutional: increase wob on bipap Vitals:   12/17/23 0453 12/17/23 0500 12/17/23 0704 12/17/23 0719  BP: 121/69 119/72    Pulse: 87 95 84   Resp: (!) 22 (!) 21 11   Temp: 98.9 F (37.2 C)     TempSrc: Oral     SpO2: 100% 96% 100% 100%  Weight:      Height:       Eyes: PERRL, lids and conjunctivae normal ENMT: Mucous membranes are moist. Posterior pharynx clear of any exudate or lesions.Normal dentition.  Neck: normal, supple, no masses, no thyromegaly Respiratory: poor air movement  quiet lungs increase wob  Cardiovascular: Regular rate and rhythm, no murmurs / rubs / gallops. No extremity edema. 2+ pedal pulses.  Abdomen: no tenderness, no masses palpated. No hepatosplenomegaly. Bowel sounds positive.  Musculoskeletal: no clubbing / cyanosis. No joint deformity upper and lower extremities. Good ROM, no contractures. Normal muscle tone.  Skin: no rashes, lesions, ulcers. No induration Neurologic: CN 2-12 grossly intact. Sensation intact,  Strength 5/5 in all 4.  Psychiatric: Normal judgment and insight. Alert and oriented x 3. Normal mood.    Labs on Admission: I have personally reviewed following labs and imaging studies  CBC: Recent Labs  Lab 12/17/23 0452  WBC 4.7  NEUTROABS 3.0  HGB 12.3*  HCT 41.1  MCV 99.3  PLT 120*   Basic Metabolic Panel: Recent Labs  Lab 12/17/23 0452  NA 143  K 4.7  CL 84*  CO2 >45*  GLUCOSE 114*  BUN 17  CREATININE 0.52*  CALCIUM 9.2   GFR: Estimated Creatinine Clearance:  67.1 mL/min (A) (by C-G formula based on SCr of 0.52 mg/dL (L)). Liver Function Tests: No results for input(s): "AST", "ALT", "ALKPHOS", "BILITOT", "PROT", "ALBUMIN" in the last 168 hours. No results for input(s): "LIPASE", "AMYLASE" in the last 168 hours. No results for input(s): "AMMONIA" in the last 168 hours. Coagulation Profile: No results for input(s): "INR", "PROTIME" in the last 168 hours. Cardiac Enzymes: No results for input(s): "CKTOTAL", "CKMB", "CKMBINDEX", "TROPONINI" in the last 168 hours. BNP (last 3 results) No results for input(s): "PROBNP" in the last 8760 hours. HbA1C: No results for input(s): "  HGBA1C" in the last 72 hours. CBG: No results for input(s): "GLUCAP" in the last 168 hours. Lipid Profile: No results for input(s): "CHOL", "HDL", "LDLCALC", "TRIG", "CHOLHDL", "LDLDIRECT" in the last 72 hours. Thyroid  Function Tests: No results for input(s): "TSH", "T4TOTAL", "FREET4", "T3FREE", "THYROIDAB" in the last 72 hours. Anemia Panel: No results for input(s): "VITAMINB12", "FOLATE", "FERRITIN", "TIBC", "IRON", "RETICCTPCT" in the last 72 hours. Urine analysis:    Component Value Date/Time   APPEARANCEUR Clear 03/17/2023 1454   GLUCOSEU Negative 03/17/2023 1454   BILIRUBINUR Negative 03/17/2023 1454   PROTEINUR 1+ (A) 03/17/2023 1454   NITRITE Negative 03/17/2023 1454   LEUKOCYTESUR Negative 03/17/2023 1454    Radiological Exams on Admission: DG Chest Portable 1 View Result Date: 12/17/2023 CLINICAL DATA:  Shortness of breath EXAM: PORTABLE CHEST 1 VIEW COMPARISON:  11/26/2023 FINDINGS: Porta catheter with tip at the upper cavoatrial junction. Hyperinflation and biapical reticulation asymmetric to the right upper chest, advanced emphysema and right apical scarring. There is no edema, consolidation, effusion, or pneumothorax. Normal heart size and mediastinal contours. Cervicothoracic fusion hardware. IMPRESSION: Chronic lung disease including right upper lobe  scarring and advanced COPD. Electronically Signed   By: Ronnette Coke M.D.   On: 12/17/2023 05:51    EKG: Independently reviewed. See above  Assessment/Plan   Acute COPD exacerbation with acute on chronic hypoxic respiratory failure -admit to progressive care  - solumedrol iv , bid taper to po prednisone  daily - ctx/ azithromycin per protocol -f/u on sputum cultures  -RVP neg -chest ct pending -cxr : NAD - nebs standing and prn  -resume chronic inhalers  -pulmonary toilet  -wean bipap as able  back to baseline 5L [place] -Pulmonary consult request , pre-lim rec transfer to ICU/Stepdown -await further pul/crit recs  BPH with LUTS -continue with flomax  and proscar    Hypothyroidism -resume synthroid   Stage IV lung ca   -s/p lobectomy -currently on a treatment gap per pcp note -mets to the spine and liver with associated radicular symptoms in lower extremities -followed by Oncologist   Depression -resume home regimen    DVT prophylaxis: heparin  Code Status: full/ as discussed per patient wishes in event of cardiac arrest  Family Communication: none at bedside Disposition Plan: patient  expected to be admitted greater than 2 midnights  Consults called: n/a Admission status: progressive care   Sabas Cradle MD [facility]   If 7PM-7AM, please contact night-coverage [email or web address] Password [email or web address]  12/17/2023, 7:36 AM

## 2023-12-17 NOTE — Telephone Encounter (Signed)
 Multiple unsuccessful attempts at contacting patients wife.  FMLA completed providing leave for office visit at [facility].  Will have Dr. B sign when he returns to the office next week.

## 2023-12-17 NOTE — ED Notes (Signed)
Pt placed on 5L [place].

## 2023-12-17 NOTE — ED Notes (Signed)
 Pt declining CT chest due to comfort care.

## 2023-12-17 NOTE — Consult Note (Signed)
 Consultation Note Date: 12/17/2023 at 1430  Patient Name: Cody [name]  DOB: [birth year]  MRN: [id number]  Age / Sex: 68 y.o., male  PCP: Arleen Lacer, MD Referring Physician: Sabas Cradle, MD  HPI/Patient Profile: 68 y.o. male  with past medical history of stage IV non-small cell lung cancer with metastasis to spine, BPH, and hypothyroidism admitted on 12/17/2023 with this of breath.  Patient is being treated for AECOPD.   After discussions with CCM pulmonologist Dr. Angel Barba graph, patient with support of family has decided to shift to comfort focused care.  PMT was consulted to support family and patient with comfort measures.  Clinical Assessment and Goals of Care: Extensive chart review completed prior to meeting patient including labs, vital signs, imaging, progress notes, orders, and available advanced directive documents from current and previous encounters. I then met with patient, his wife, and his 3 daughters at bedside in the ED to discuss diagnosis prognosis, GOC, EOL wishes, disposition and options.  I introduced Palliative Medicine as specialized medical care for people living with serious illness. It focuses on providing relief from the symptoms and stress of a serious illness. The goal is to improve quality of life for both the patient and the family.  We discussed a brief life review of the patient.  Patient has been married for 42 years.  He has 4 children, 3 daughters and 1 son (lives in [place] ) [place].  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  He enjoyed playing the drums and was an avid runner (completed a marathon, ran track in college -[employer]).   Confirmed with patient and family that he has decided to forego aggressive medical treatments and to focus on comfort.  We discussed comfort measures. Ed would no longer receive aggressive medical  interventions such as continuous vital signs, lab work, radiology testing, or medications not focused on comfort. All care would focus on how he is looking and feeling. This would include management of any symptoms that may cause discomfort, pain, shortness of breath, cough, nausea, agitation, anxiety, and/or secretions etc. Symptoms would be managed with medications and other non-pharmacological interventions such as spiritual support if requested, repositioning, music therapy, or therapeutic listening.   Family verbalized understanding and appreciation.  Space and opportunity provided for patient and family to voice their concerns and asked questions.  Therapeutic silence, active listening, and emotional support provided.  Patient's daughter gave birth to his grandchild 2 weeks ago and he has not met this grandchild yet.  Confirmed with family that patient's 4-week-old grandchild should be allowed to visit under comfort measures and unrestricted visitor access.  Briefly discussed hospice philosophy, home with hospice, and potential for hospice inpatient unit placement.  However, we will monitor patient and reassess on a daily basis.  After visiting with patient and family, I counseled with RN.  Adjustments made to [facility] to reflect full comfort measures.  PMT will continue to follow and support patient and family throughout his hospitalization.  Primary Decision  Maker PATIENT  Physical Exam Vitals reviewed.  Constitutional:      Comments: Thin, frail  HENT:     Head:     Comments: Temporal wasting Cardiovascular:     Rate and Rhythm: Normal rate.  Pulmonary:     Effort: Pulmonary effort is normal.     Comments: [place] in place Skin:    General: Skin is warm and dry.  Neurological:     Mental Status: He is alert and oriented to person, place, and time.  Psychiatric:        Mood and Affect: Mood normal. Mood is not anxious.        Behavior: Behavior normal. Behavior is not agitated.      Palliative Assessment/Data: 50-60%     Thank you for this consult. Palliative medicine will continue to follow and assist holistically.   Time Total: 75 minutes  Time spent includes: Detailed review of medical records (labs, imaging, vital signs), medically appropriate exam (mental status, respiratory, cardiac, skin), discussed with treatment team, counseling and educating patient, family and staff, documenting clinical information, medication management and coordination of care.  Signed by: Eller Gut, DNP, FNP-BC Palliative Medicine   Please contact Palliative Medicine Team providers via [facility] for questions and concerns.

## 2023-12-17 NOTE — IPAL (Signed)
 GOALS OF CARE FAMILY CONFERENCE    I met with patient and wife at bedside.  Patient with BIPAP on.  We discussed his condition and care path.  Patient clearly states he does not wish to discuss any medical treatment.  His wife was present and reports that he has already been through rigorous treatment and is well aware of options and treatments. Patient and wife are both requesting that we remove the BIPAP and stop all therapy.  Patient specifically requests "stop it all I want to end this now".  Further we discussed comfort care options and he is agreeable to receiving oxygen  for air hunger and anxiolytic/analgesic for dyspnea and feelings of suffocation.  We have discussed palliative care evaluation and they are agreeable.  Overal patient with end stage COPD and metastatic cancer.   Current clinical status, hospital findings and medical plan was reviewed with family.   They have consented and agreed to DNR/DNI COMFORT CARE Code status   Family are satisfied with Plan of action and management. All questions answered  Additional Critical Care time 35 mins    Erskin Hearing, M.D.  Pulmonary & Critical Care Medicine  [facility]

## 2023-12-17 NOTE — ED Notes (Signed)
 Pt reporting to this RN and respiratory therapist that he does not want to receive anymore treatment at this time and pt wants to remove bipap and declining CT scan at this time. MD made aware. Pt agreeable to keep bipap on until MD comes to speak to pt.

## 2023-12-17 NOTE — ED Notes (Signed)
 CCMD called to initiate cardiac monitoring

## 2023-12-17 NOTE — ED Notes (Signed)
 Pt reporting to this RN and MD Jaclynn Mast that he wants to remove the bipap and wants only comfort measures. Per MD, pt removed from bipap at this time and on O2 for comfort.

## 2023-12-17 NOTE — Telephone Encounter (Signed)
 Wife called to say that she is going to be having FMLA for her husband Malinowski wife called saying that she needs FMLA for her self trying to take care of Cody [name] which is the husband.  I called her number and no one answered and I left her a message that I do not know if she is going to bring the FMLA papers here or fax it in and I gave the fax #[phone]

## 2023-12-18 DIAGNOSIS — J441 Chronic obstructive pulmonary disease with (acute) exacerbation: Secondary | ICD-10-CM | POA: Diagnosis not present

## 2023-12-18 DIAGNOSIS — E43 Unspecified severe protein-calorie malnutrition: Secondary | ICD-10-CM | POA: Diagnosis not present

## 2023-12-18 DIAGNOSIS — C3490 Malignant neoplasm of unspecified part of unspecified bronchus or lung: Secondary | ICD-10-CM

## 2023-12-18 DIAGNOSIS — Z515 Encounter for palliative care: Secondary | ICD-10-CM | POA: Diagnosis not present

## 2023-12-18 DIAGNOSIS — J9621 Acute and chronic respiratory failure with hypoxia: Secondary | ICD-10-CM | POA: Diagnosis not present

## 2023-12-18 NOTE — Progress Notes (Signed)
 [facility] Hospital Liaison Note  Follow-up meeting held at bedside with patient, his wife and their 4 children.  Hospital staff present- Isadore Marble, NP with Palliative Team and patient's hospitalist, Dr. Melvinia Stager.  This RN discussed hospice options at discharge.  Patient/family is now in agreement to take patient home with hospice services pending 24 hour evaluation period of how he does weaning his oxygen  down.  DME discussed. DME in the home:  Oxygen  concentrator and tanks, Standard wheelchair, BSC, shower chair.  DME request:  hospital bed with 2 bed rails, over bed table, Oxygen  concentrator with portable tanks, transport wheelchair.  Will re-assess tomorrow for hospice InPatient Unit if patient requiring frequent IV meds for symptom management.  At this time, no symptom management needs.  Hospital Medical Team and [facility] notified of above information.  Please do not hesitate to call with any hospice related questions/concerns.  Ethan Henri Nurse Liaison [phone]

## 2023-12-18 NOTE — Progress Notes (Signed)
 [facility]  - [place] [facility]   PATIENT NAME: Antoino Westhoff    MR#:  [id number]  DATE OF BIRTH:  May 10, 1956  SUBJECTIVE:   Patient laying in bed comfortably. No respiratory distress. No family in the room during my evaluation. Patient tells me son and wife are coming a bit later today.   VITALS:  Blood pressure (!) 115/91, pulse 77, temperature 98.7 F (37.1 C), resp. rate 18, height 5\' 9"  (1.753 m), weight 54.4 kg, SpO2 100%.  PHYSICAL EXAMINATION:  limited GENERAL:  68 y.o.-year-old patient with no acute distress. Thin cachectic malnourished LUNGS: distant breath sounds bilaterally, no wheezing CARDIOVASCULAR: S1, S2 normal. No murmur   ABDOMEN: Soft, nontender, nondistended. Bowel sounds present.  EXTREMITIES: No  edema b/l.    NEUROLOGIC: nonfocal  patient is alert and awake    LABORATORY PANEL:  CBC Recent Labs  Lab 12/17/23 0812  WBC 4.3  HGB 11.3*  HCT 37.6*  PLT 112*    Chemistries  Recent Labs  Lab 12/17/23 0452 12/17/23 0812  NA 143  --   K 4.7  --   CL 84*  --   CO2 >45*  --   GLUCOSE 114*  --   BUN 17  --   CREATININE 0.52* 0.59*  CALCIUM 9.2  --    Cardiac Enzymes No results for input(s): "TROPONINI" in the last 168 hours. RADIOLOGY:  DG Chest Portable 1 View Result Date: 12/17/2023 CLINICAL DATA:  Shortness of breath EXAM: PORTABLE CHEST 1 VIEW COMPARISON:  11/26/2023 FINDINGS: Porta catheter with tip at the upper cavoatrial junction. Hyperinflation and biapical reticulation asymmetric to the right upper chest, advanced emphysema and right apical scarring. There is no edema, consolidation, effusion, or pneumothorax. Normal heart size and mediastinal contours. Cervicothoracic fusion hardware. IMPRESSION: Chronic lung disease including right upper lobe scarring and advanced COPD. Electronically Signed   By: Ronnette Coke M.D.   On: 12/17/2023 05:51   Assessment and Plan 68 y.o. make with PMHx significant for severe COPD  requiring 4-5 L supplemental oxygen  at baseline, Stage IV lung cancer s/p lobectomy with mets to spine and liver, admitted with Acute on Chronic Hypoxic Respiratory Failure due to Acute COPD Exacerbation requiring BiPAP.  High risk for intubation.   Upon EMS arrival patient was hypoxic with O2 saturations in the 80's on his chronic 5L, of which he was subsequently transitioned to NRB and given 125mg  solumedrol , as well dounebs.   Acute on Chronic Hypoxic Respiratory Failure with acute on chronic COPD Exacerbation Stage IV Lung cancer s/p Lobectomy with mets to spine and liver Adult failure to thrive/severe protein calorie malnourishment  Met with patient at bedside. No family in the room. Patient is overwhelmed and wants to" get over with it. He is wanting to stay here in the hospital for hospice needs. Discussed inpatient versus home with hospice eligibility/criteria. Patient did voice understanding. His main concern is not to burden his wife and family. [facility]. Discussed will continue to see progress over the weekend and revisit on Monday. Patient was appreciate if of the conversation. Will continue comfort care measures.   Procedures: Family communication :none today Consults :Palliative care CODE STATUS: DNR/DNI DVT Prophylaxis :comfort care Level of care: Palliative Care Status is: Inpatient     TOTAL TIME TAKING CARE OF THIS PATIENT: 40 minutes.  >50% time spent on counselling and coordination of care  Note: This dictation was prepared with Dragon dictation  along with smaller phrase technology. Any transcriptional errors that result from this process are unintentional.  Melvinia Stager M.D    [facility]   CC: Primary care physician; Sowles, Krichna, MD

## 2023-12-18 NOTE — TOC Initial Note (Addendum)
 Transition of Care [facility]) - Initial/Assessment Note    Patient Details  Name: Cody [name] MRN: [id number] Date of Birth: 10-31-1955  Transition of Care [facility]) CM/SW Contact:    Crayton Docker, RN 12/18/2023, 11:47 AM  Clinical Narrative:                  CM to patient's room regarding TOC screening assessment. CM introduced case management role and discharge care planning process. Patient's wife, Con Decant and son, [place] at patient's bedside. Patient and patient's family verbalized understanding and agreement with screening assessment. Patient verbalized previous experience with home health for physical therapy services and home hospice services for his mother through [facility].   CM and patient and patient family discussed home hospice versus inpatient hospice options. Patient and patient's family verbalized preference for remaining inpatient through  hospice services. Patient is now comfort care.   Deborrah Fam, [facility] following for possible home with hospice services. Alert per Dr. Lydia Sams, will monitor patient over the weekend regarding status before possible home with hospice services.   Expected Discharge Plan:  (Inpatient Hospice) Barriers to Discharge: No Barriers Identified   Patient Goals and CMS Choice    Inpatient Hospice    Expected Discharge Plan and Services    Home hospice   Living arrangements for the past 2 months: Single Family Home       Prior Living Arrangements/Services Living arrangements for the past 2 months: Single Family Home Lives with:: Self, Spouse Patient language and need for interpreter reviewed:: No        Need for Family Participation in Patient Care: Yes (Comment) Care giver support system in place?: Yes (comment) Current home services: DME [employer], [employer], Wheelchair(DME through private purchase), Home oxygen  through [employer]) Criminal Activity/Legal Involvement Pertinent to Current Situation/Hospitalization: No - Comment as needed  Activities  of Daily Living   ADL Screening (condition at time of admission) Independently performs ADLs?: No Does the patient have a NEW difficulty with bathing/dressing/toileting/self-feeding that is expected to last >3 days?: No Does the patient have a NEW difficulty with getting in/out of bed, walking, or climbing stairs that is expected to last >3 days?: No Does the patient have a NEW difficulty with communication that is expected to last >3 days?: No Is the patient deaf or have difficulty hearing?: No Does the patient have difficulty seeing, even when wearing glasses/contacts?: No Does the patient have difficulty concentrating, remembering, or making decisions?: No  Permission Sought/Granted Permission sought to share information with : Family Supports, Case Manager Permission granted to share information with : Yes, Verbal Permission Granted  Share Information with NAME: [name]     Permission granted to share info w Relationship: Wife/Son  Permission granted to share info w Contact Information: yes  Emotional Assessment Appearance:: Appears stated age Attitude/Demeanor/Rapport: Engaged Affect (typically observed): Calm Orientation: : Oriented to Self, Oriented to Place, Oriented to  Time, Oriented to Situation   Psych Involvement: No (comment)  Admission diagnosis:  COPD (chronic obstructive pulmonary disease) (HCC) [J44.9] COPD exacerbation (HCC) [J44.1] Acute on chronic respiratory failure with hypoxia (HCC) [J96.21] Patient Active Problem List   Diagnosis Date Noted   COPD (chronic obstructive pulmonary disease) (HCC) 12/17/2023   Dysthymia 05/21/2023   Centrilobular emphysema (HCC) 05/21/2023   Moderate protein-calorie malnutrition (HCC) 01/14/2021   Atherosclerosis of aorta (HCC) 01/14/2021   Senile purpura (HCC) 04/10/2020   S/P spinal fusion 12/23/2019   Goals of care, counseling/discussion 12/11/2019   Hypoxia 11/30/2019  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 [place] [address] Phone: [phone] Fax: [phone]  [id number] [place], [place] - [address] [place] [address] Phone: [phone] Fax: [phone]  [facility] [address] [place] [address] Phone: [phone] Fax: [phone]     Social Drivers of Health (SDOH) Social History: SDOH Screenings   Food Insecurity: No Food Insecurity (12/17/2023)  Housing: Low Risk  (12/17/2023)  Transportation Needs: No Transportation Needs (12/17/2023)  Utilities: Not At Risk (12/17/2023)  Alcohol Screen: Low Risk  (07/10/2023)  Depression (PHQ2-9): Low Risk  (12/01/2023)  Financial Resource Strain: Low Risk  (07/10/2023)  Physical Activity: Unknown (07/10/2023)  Social Connections: Patient Declined (12/17/2023)  Stress: No Stress Concern Present (07/10/2023)  Tobacco Use: Medium Risk (12/17/2023)   SDOH Interventions:     Readmission Risk Interventions     No data to display

## 2023-12-18 NOTE — Consult Note (Signed)
 NAME:  Cody [name], MRN:  [id number], DOB:  21-Apr-1956, LOS: 1 ADMISSION DATE:  12/17/2023, CONSULTATION DATE:  12/17/23 REFERRING MD:  Dr. Andy Bannister, CHIEF COMPLAINT:  Shortness of Breath   Brief Pt Description / Synopsis:  68 y.o. 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  High risk for intubation.  History of Present Illness:  Cody [name] is a 68 y.o. male with medical history significant for Severe COPD, chronic hypoxic respiratory failure requiring 4-5 L supplemental oxygen  at baseline, Stage IV Lung cancer s/p lobectomy with mets to the spine and liver,  BPH with LUTS, hypothyroidism who presented to [facility] ED on 12/17/2023  with complaint of acute onset of shortness of breath approximately 1 hour prior to presentation this morning.  Upon EMS arrival patient was hypoxic with O2 saturations in the 80's on his chronic 5L, of which he was subsequently transitioned to NRB and given 125mg  solumedrol , as well dounebs. Patient and his wife deny fever, chills, chest pain, palpitations, edema, cough, sputum production, abdominal pain, N/V/D, dysuria, history of DVT or PE, or any know sick contacts.  Given his work of breathing, he was transitioned over to BiPAP in the ED.  ED Course: Initial Vital Signs: Temperature 98.9 F orally, pulse 87, respiratory rate 22, blood pressure 121/69, SpO2 100% on nonrebreather Significant Labs:  Bicarb >45, glucose 114, HS troponin 10, WBC 4.7, Platelets 120, procalcitonin <0.10, D dimer 0.39 COVID/FLU/RSV PCR is negative Imaging Chest X-ray>>FINDINGS: Porta catheter with tip at the upper cavoatrial junction. Hyperinflation and biapical reticulation asymmetric to the right upper chest, advanced emphysema and right apical scarring. There is no  edema, consolidation, effusion, or pneumothorax. Normal heart size and mediastinal contours. Cervicothoracic fusion hardware. CTa Chest>> Medications Administered: Duonebs, Levaquin  12/18/23- patient awake sitting up.  Family visiting.  He seems to be breathing with less distress today.  He is now being treated for comfort only with hospice service taking over and PCCM team with sign off at this time.    Pertinent  Medical History   Past Medical History:  Diagnosis Date   Allergy    Cancer of upper lobe of right lung (HCC) 10/2019   COPD (chronic obstructive pulmonary disease) (HCC)    Prostate enlargement      Micro Data:  5/22: COVID/FLU/RSV PCR>> negative 5/22: RVP >> 5/22: Sputum>> 5/22: Strep pneumo & Legionella urinary antigens>>  Antimicrobials:   Anti-infectives (From admission, onward)    Start     Dose/Rate Route Frequency Ordered Stop   12/18/23 1000  levofloxacin (LEVAQUIN) tablet 750 mg  Status:  Discontinued        750 mg Oral Daily 12/17/23 0801 12/17/23 1414   12/17/23 0815  levofloxacin (LEVAQUIN) IVPB 750 mg        750 mg 100 mL/hr over 90 Minutes Intravenous  Once 12/17/23 0801 12/17/23 1109       Objective    Blood pressure 130/64, pulse 89, temperature 99.1 F (37.3 C), resp. rate 18, height 5\' 9"  (1.753 m), weight 54.4 kg, SpO2 100%.        Intake/Output Summary (Last 24 hours) at 12/18/2023 0810 Last data filed at 12/17/2023 1900 Gross per 24 hour  Intake 0 ml  Output --  Net 0 ml   [employer]  12/17/23 0448  Weight: 54.4 kg    Examination: General: Acute on chronically ill appearing frail elderly male, sitting in bed, on BiPAP with moderate respiratory distress HENT: Atraumatic, normocephalic, neck supple, no JVD Lungs: Clear diminished breath sounds throughout, BiPAP assisted, tachypnea with assessory muscle use Cardiovascular: RRR, s1s2, no M/R/G Abdomen: Soft, nontender, nondistended, no guarding or rebound  tenderness Extremities: Normal bulk and tone, no deformitites, no edema Neuro: Awake and alert, anxious, oriented x4, moves all extremities to commands, no focal deficits, speech clear, PERRL GU: Deferrred  Resolved Hospital Problem list     Assessment & Plan:   #Acute on Chronic Hypoxic Respiratory Failure due to ... #Acute COPD Exacerbation #Stage IV Lung cancer s/p Lobectomy with mets to spine and liver PMHx: Severe COPD requiring 4-5 L supplemental O2 at baseline -Supplemental O2 as needed to maintain O2 sats 88 to 92% -Follow intermittent Chest X-ray & ABG as needed -Bronchodilators & Pulmicort nebs -Pulmonary toilet as able -Prn Morphine for air hunger -Palliative Care consulted, appreciate input   Best Practice (right click and "Reselect all SmartList Selections" daily)   Diet/type: NPO until respiratory status improved DVT prophylaxis: prophylactic heparin   GI prophylaxis: N/A Lines: N/A Foley:  N/A Code Status:  DNR/DNI Last date of multidisciplinary goals of care discussion [N/A]   Labs   CBC: Recent Labs  Lab 12/17/23 0452 12/17/23 0812  WBC 4.7 4.3  NEUTROABS 3.0  --   HGB 12.3* 11.3*  HCT 41.1 37.6*  MCV 99.3 99.7  PLT 120* 112*    Basic Metabolic Panel: Recent Labs  Lab 12/17/23 0452 12/17/23 0812  NA 143  --   K 4.7  --   CL 84*  --   CO2 >45*  --   GLUCOSE 114*  --   BUN 17  --   CREATININE 0.52* 0.59*  CALCIUM 9.2  --    GFR: Estimated Creatinine Clearance: 67.1 mL/min (A) (by C-G formula based on SCr of 0.59 mg/dL (L)). Recent Labs  Lab 12/17/23 0452 12/17/23 0812  PROCALCITON  --  <0.10  WBC 4.7 4.3    Liver Function Tests: No results for input(s): "AST", "ALT", "ALKPHOS", "BILITOT", "PROT", "ALBUMIN" in the last 168 hours. No results for input(s): "LIPASE", "AMYLASE" in the last 168 hours. No results for input(s): "AMMONIA" in the last 168 hours.  ABG No results found for: "PHART", "PCO2ART", "PO2ART", "HCO3", "TCO2",  "ACIDBASEDEF", "O2SAT"   Coagulation Profile: No results for input(s): "INR", "PROTIME" in the last 168 hours.  Cardiac Enzymes: No results for input(s): "CKTOTAL", "CKMB", "CKMBINDEX", "TROPONINI" in the last 168 hours.  HbA1C: Hemoglobin A1C  Date/Time Value Ref Range Status  04/10/2020 11:44 AM 5.8 (A) 4.0 - 5.6 % Final   Hgb A1c MFr Bld  Date/Time Value Ref Range Status  10/21/2019 03:17 PM 6.1 (H) 4.8 - 5.6 % Final    Comment:             Prediabetes: 5.7 - 6.4          Diabetes: >6.4          Glycemic control for adults with diabetes: <7.0   03/16/2017 12:11 PM 5.7 (H) <5.7 % Final    Comment:      For someone without known diabetes, a hemoglobin A1c value between 5.7% and 6.4% is consistent with prediabetes and should be confirmed with a follow-up test.   For someone with known diabetes, a value <7% indicates that their diabetes is well controlled. A1c  targets should be individualized based on duration of diabetes, age, co-morbid conditions and other considerations.   This assay result is consistent with an increased risk of diabetes.   Currently, no consensus exists regarding use of hemoglobin A1c for diagnosis of diabetes in children.       CBG: No results for input(s): "GLUCAP" in the last 168 hours.  Review of Systems:   Positives in BOLD: Gen: Denies fever, chills, weight change, fatigue, night sweats HEENT: Denies blurred vision, double vision, hearing loss, tinnitus, sinus congestion, rhinorrhea, sore throat, neck stiffness, dysphagia PULM: Denies shortness of breath, cough, sputum production, hemoptysis, wheezing CV: Denies chest pain, edema, orthopnea, paroxysmal nocturnal dyspnea, palpitations GI: Denies abdominal pain, nausea, vomiting, diarrhea, hematochezia, melena, constipation, change in bowel habits GU: Denies dysuria, hematuria, polyuria, oliguria, urethral discharge Endocrine: Denies hot or cold intolerance, polyuria, polyphagia or appetite  change Derm: Denies rash, dry skin, scaling or peeling skin change Heme: Denies easy bruising, bleeding, bleeding gums Neuro: Denies headache, numbness, weakness, slurred speech, loss of memory or consciousness   Past Medical History:  He,  has a past medical history of Allergy, Cancer of upper lobe of right lung (HCC) (10/2019), COPD (chronic obstructive pulmonary disease) (HCC), and Prostate enlargement.   Surgical History:   Past Surgical History:  Procedure Laterality Date   HERNIA REPAIR Bilateral 1982   HERNIA REPAIR Right 1994   IR IMAGING GUIDED PORT INSERTION  02/24/2020   LAMINECTOMY FOR EXCISION / EVACUATION INTRASPINAL LESION  11/07/2019   T1-T 6 at [facility]      Social History:   reports that he quit smoking about 12 years ago. His smoking use included cigarettes. He started smoking about 42 years ago. He has a 60 pack-year smoking history. He has never used smokeless tobacco. He reports current alcohol use of about 5.0 standard drinks of alcohol per week. He reports that he does not use drugs.   Family History:  His family history includes CVA in his mother; Heart disease in his father; Lung cancer in his brother and sister.   Allergies No Known Allergies   Home Medications  Prior to Admission medications   Medication Sig Start Date End Date Taking? Authorizing Provider  acetaminophen  (TYLENOL ) 500 MG tablet Take 500-1,000 mg by mouth every 6 (six) hours as needed for mild pain or fever.    Yes [provider]  albuterol  (VENTOLIN  HFA) 108 (90 Base) MCG/ACT inhaler Inhale 2 puffs into the lungs every 4 (four) hours as needed for wheezing or shortness of breath. 10/12/23  Yes Sowles, Krichna, MD  arformoterol  (BROVANA ) 15 MCG/2ML NEBU Take 2 mLs (15 mcg total) by nebulization 2 (two) times daily. 10/15/23  Yes Assaker, Marianne Shirts, MD  Calcium Carb-Cholecalciferol (OYSTER SHELL CALCIUM) 500-400 MG-UNIT TABS Take 500 tablets by mouth.   Yes [provider]   escitalopram  (LEXAPRO ) 5 MG tablet Take 1 tablet (5 mg total) by mouth every morning. 05/21/23  Yes Sowles, Krichna, MD  finasteride  (PROSCAR ) 5 MG tablet TAKE 1 TABLET BY MOUTH DAILY 12/10/23  Yes Dustin Gimenez, MD  fluticasone  (FLONASE ) 50 MCG/ACT nasal spray Place 1 spray into both nostrils daily. 10/15/23 10/14/24 Yes Assaker, Marianne Shirts, MD  levothyroxine  (SYNTHROID ) 50 MCG tablet TAKE 1 TABLET BY MOUTH DAILY BEFORE BREAKFAST 06/22/23  Yes Brahmanday, Govinda R, MD  Multiple Vitamin (MULTIVITAMIN) tablet Take 1 tablet by mouth daily.   Yes [provider]  pravastatin  (PRAVACHOL ) 20 MG tablet Take 1 tablet (20 mg total) by mouth daily.  12/01/23  Yes Sowles, Krichna, MD  QUEtiapine  (SEROQUEL ) 25 MG tablet Take 1 tablet (25 mg total) by mouth at bedtime. 12/01/23  Yes Sowles, Krichna, MD  tamsulosin  (FLOMAX ) 0.4 MG CAPS capsule TAKE 1 CAPSULE BY MOUTH DAILY 12/10/23  Yes Dustin Gimenez, MD  chlorhexidine  (PERIDEX ) 0.12 % solution Use as directed 15 mLs in the mouth or throat 2 (two) times daily. Patient not taking: Reported on 12/17/2023 09/25/23   Brahmanday, Govinda R, MD  ipratropium-albuterol  (DUONEB) 0.5-2.5 (3) MG/3ML SOLN Take 3 mLs by nebulization every 6 (six) hours. Patient not taking: Reported on 12/17/2023 07/02/23   Annitta Kindler, MD  Loratadine  10 MG CAPS  10/31/22   [provider]  traMADol (ULTRAM) 50 MG tablet Take 50 mg by mouth 4 (four) times daily. Patient not taking: Reported on 12/17/2023 11/02/23   [provider]      Erskin Hearing, M.D.  Pulmonary & Critical Care Medicine  [facility]

## 2023-12-18 NOTE — Progress Notes (Signed)
 Daily Progress Note   Patient Name: Cody [name]       Date: 12/18/2023 DOB: 1956-01-30  Age: 68 y.o. MRN#: [id number] Attending Physician: Melvinia Stager, MD Primary Care Physician: Sowles, Krichna, MD Admit Date: 12/17/2023  Reason for Consultation/Follow-up: Establishing goals of care  HPI/Brief Hospital Review: 68 y.o. male  with past medical history of stage IV non-small cell lung cancer with metastasis to spine, BPH, and hypothyroidism admitted on 12/17/2023 with this of breath.  Patient is being treated for AECOPD.    After discussions with CCM pulmonologist Dr. Aleskerov, patient with support of family has decided to shift to comfort focused care on 5/22.  PMT was consulted to support family and patient with comfort measures.  Subjective: Extensive chart review has been completed prior to meeting patient including labs, vital signs, imaging, progress notes, orders, and available advanced directive documents from current and previous encounters.    Visited with Cody [name], his wife and son at bedside. Cody [name] remains awake, alert and able to engage in conversations. He reports feeling well today without acute complaints, denies acute pain, shortness of breath or difficulty breathing. Cody [name] and family discuss being interested in hospice services. Connected with TOC as well as [facility] as patient expressed interest in [facility] as they utilized their services for other family members in the past.  After assessment from hospice liaison, Cody [name] not appropriate for [employer]. He and family express interest in remaining inpatient.  Later met in collaboration with Dr. Lydia Sams and Girard Lam. I discussed plan of care and comfort measures. Cody [name] and family voice understanding of comfort measures and  all agreed to continue.  We as a group discussed slowly titrating down supplemental oxygen  to a level of comfort and treating symptoms of shortness of breath, air hunger and increased work of breathing with medications such as morphine and lorazepam  for anxiety.  Cody [name] and family voiced understanding and wished to proceed with titrating down oxygen .  Notified nursing staff.  Girard Lam again reviewed hospice services and the difference between receiving services at home, long-term care facility, versus IPU.  Decision made to reassess within 24 hours with titrating down oxygen  and assessing for most appropriate disposition but family and Cody [name] likely leaning towards returning home with hospice services following.  Answered and  addressed all questions and concerns.  PMT to continue to follow for ongoing needs and support.  Objective:  Physical Exam Constitutional:      General: He is not in acute distress.    Appearance: He is ill-appearing.  Pulmonary:     Effort: Pulmonary effort is normal. No tachypnea.  Skin:    General: Skin is warm and dry.  Neurological:     Mental Status: He is alert and oriented to person, place, and time.     Motor: Weakness present.  Psychiatric:        Mood and Affect: Mood normal.        Thought Content: Thought content normal.             Vital Signs: BP (!) 115/91 (BP Location: Right Arm)   Pulse 77   Temp 98.7 F (37.1 C)   Resp 18   Ht 5\' 9"  (1.753 m)   Wt 54.4 kg   SpO2 100%   BMI 17.72 kg/m  SpO2: SpO2: 100 % O2 Device: O2 Device: Nasal Cannula O2 Flow Rate: O2 Flow Rate (L/min): 5 L/min   Palliative Care Assessment & Plan   Assessment/Recommendation/Plan  CMO remains Titrate down supplemental oxygen  based on comfort, utilize ordered medications such as Morphine, Lorazepam  and Robinul if symptoms arise to maintain comfort Assess for most appropriate disposition-likely home with hospice-hospice liaison following  Care plan  was discussed with primary team, nursing staff and hospice liaison.  Thank you for allowing the Palliative Medicine Team to assist in the care of this patient.  Total time:  50 minutes  Time spent includes: Detailed review of medical records (labs, imaging, vital signs), medically appropriate exam (mental status, respiratory, cardiac, skin), discussed with treatment team, counseling and educating patient, family and staff, documenting clinical information, medication management and coordination of care.  Isadore Marble, DNP, AGNP-C Palliative Medicine   Please contact Palliative Medicine Team phone at [phone] for questions and concerns.

## 2023-12-18 NOTE — Progress Notes (Signed)
 [facility]) HOSPITAL LIAISON NOTE  Received request from Isadore Marble PMT NP and Marcela Senters, Transitions of Care Manager, for evaluation of hospice InPatient Unit [facility]).  At this time, patient does not have any unmanaged symptoms and does not meet criteria for IPU.    No family is at the bedside.  I spoke with Mr. Lorey at bedside to initiate education related to hospice philosophy, services, and team approach to care. I notified him that currently, he does not meet criteria to go to the InPatient Unit.  Discussed discharge options with hospice care.  He states going home is not an option.  He expresses concern of not wanting to be a burden on his family.  He also states they do not have the money for a facility.    [facility] information and contact numbers given to Mr. Nichter in Thomas packet left at the bedside.  Above information shared with Marcela Senters, Transitions of Care Manager, Dr. Lydia Sams and hospital medical care team.  Joint family meeting scheduled for this afternoon with patient's wife, children, Dr. Lydia Sams, Isadore Marble NP/PMT and me.  Please call with any hospice related questions or concerns.  Thank you for the opportunity to participate in this patient's care.  Ambrosio Junker, MA, BSN, RN, FNE Nurse Liaison [phone]

## 2023-12-18 NOTE — Progress Notes (Signed)
   12/18/23 0200  Spiritual Encounters  Type of Visit Follow up  Care provided to: Pt not available  Referral source Chaplain assessment  Reason for visit Urgent spiritual support  OnCall Visit Yes   Chaplain follow up. Patient resting and unavailable at this time.

## 2023-12-19 DIAGNOSIS — E43 Unspecified severe protein-calorie malnutrition: Secondary | ICD-10-CM

## 2023-12-19 DIAGNOSIS — C3490 Malignant neoplasm of unspecified part of unspecified bronchus or lung: Secondary | ICD-10-CM | POA: Diagnosis not present

## 2023-12-19 DIAGNOSIS — Z515 Encounter for palliative care: Secondary | ICD-10-CM | POA: Diagnosis not present

## 2023-12-19 DIAGNOSIS — J9621 Acute and chronic respiratory failure with hypoxia: Secondary | ICD-10-CM | POA: Diagnosis not present

## 2023-12-19 DIAGNOSIS — J441 Chronic obstructive pulmonary disease with (acute) exacerbation: Secondary | ICD-10-CM | POA: Diagnosis not present

## 2023-12-19 MED ORDER — SCOPOLAMINE 1 MG/3DAYS TD PT72
1.0000 | MEDICATED_PATCH | TRANSDERMAL | Status: DC
  Administered 2023-12-19: 1.5 mg via TRANSDERMAL
  Filled 2023-12-19: qty 1

## 2023-12-19 MED ORDER — ACETAMINOPHEN 650 MG RE SUPP: 650.0000 mg | Freq: Four times a day (QID) | RECTAL | Status: DC | PRN

## 2023-12-19 MED ORDER — ACETAMINOPHEN 325 MG PO TABS: 650.0000 mg | ORAL_TABLET | Freq: Four times a day (QID) | ORAL | Status: DC | PRN

## 2023-12-19 MED ORDER — MORPHINE SULFATE (CONCENTRATE) 10 MG /0.5 ML PO SOLN: 10.0000 mg | ORAL | Status: DC | PRN

## 2023-12-22 NOTE — Telephone Encounter (Signed)
 Upon review of chart, patient has deceased

## 2023-12-27 NOTE — Death Summary Note (Signed)
 DEATH SUMMARY   Patient Details  Name: Cody [name] MRN: [id number] DOB: Dec 02, 1955 WUJ:[id number], Cody Laura, MD Admission/Discharge Information   Admit Date:  01/13/2024  Date of Death: Date of Death: 2024/01/15  Time of Death: Time of Death: 01/23/27  Length of Stay: 2   Principle Cause of death: Metastatic lung cancer Acute on Chronic Respiratory failure/COPD  Hospital Diagnoses: Principal Problem:   COPD (chronic obstructive pulmonary disease) (HCC) Active Problems:   Acute on chronic respiratory failure with hypoxia (HCC)   Protein-calorie malnutrition, severe (HCC)   Primary malignant neoplasm of lung metastatic to other site [facility])   68 y.o. 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  High risk for intubation.     Acute on Chronic Hypoxic Respiratory Failure with acute on chronic COPD Exacerbation Stage IV Lung cancer s/p Lobectomy with mets to spine and liver Adult failure to thrive/severe protein calorie malnourishment   patient overall decline during hospital stay. Patient and family requested comfort care. Hospice was involved. Patient passed away peacefully with family around on 06-20-25at 01/23/27.          The results of significant diagnostics from this hospitalization (including imaging, microbiology, ancillary and laboratory) are listed below for reference.   Significant Diagnostic Studies: DG Chest Portable 1 View Result Date: January 13, 2024 CLINICAL DATA:  Shortness of breath EXAM: PORTABLE CHEST 1 VIEW COMPARISON:  11/26/2023 FINDINGS: Porta catheter with tip at the upper cavoatrial junction. Hyperinflation and biapical reticulation asymmetric to the right upper chest, advanced emphysema and right apical scarring. There is no edema, consolidation,  effusion, or pneumothorax. Normal heart size and mediastinal contours. Cervicothoracic fusion hardware. IMPRESSION: Chronic lung disease including right upper lobe scarring and advanced COPD. Electronically Signed   By: Ronnette Coke M.D.   On: 01-13-2024 05:51   Sleep Study Documents Result Date: 11/30/2023 Ordered by an unspecified provider.  CT Angio Chest PE W and/or Wo Contrast Result Date: 11/26/2023 CLINICAL DATA:  Shortness of breath. Left-sided rib pain. History of metastatic lung cancer. EXAM: CT ANGIOGRAPHY CHEST WITH CONTRAST TECHNIQUE: Multidetector CT imaging of the chest was performed using the standard protocol during bolus administration of intravenous contrast. Multiplanar CT image reconstructions and MIPs were obtained to evaluate the vascular anatomy. RADIATION DOSE REDUCTION: This exam was performed according to the departmental dose-optimization program which includes automated exposure control, adjustment of the mA and/or kV according to patient size and/or use of iterative reconstruction technique. CONTRAST:  75mL OMNIPAQUE  IOHEXOL  350 MG/ML SOLN COMPARISON:  September 14, 2023. FINDINGS: Cardiovascular: Satisfactory opacification of the pulmonary arteries to the segmental level. No evidence of pulmonary embolism. Normal heart size. No pericardial effusion. Mediastinum/Nodes: No enlarged mediastinal, hilar, or axillary lymph nodes. Thyroid  gland, trachea, and esophagus demonstrate no significant findings. Lungs/Pleura: No pneumothorax is noted. Emphysematous disease is noted. Small right pleural effusion is noted with minimal adjacent subsegmental atelectasis. Stable scarring and post treatment changes noted in right lung apex. Upper Abdomen: No acute abnormality. Musculoskeletal: Stable sclerotic expansile lesions are seen involving the posterior portions of the right third, fourth and fifth ribs concerning for metastatic disease. Status post surgical posterior fusion of upper thoracic  spine. No definite acute osseous abnormality is noted. Review of the MIP images confirms the above findings. IMPRESSION: No definite evidence of pulmonary  embolus. Small right pleural effusion. Stable scarring or post treatment change in right lung apex. Stable sclerotic expansile lesions involving several right ribs concerning for metastatic disease. Aortic Atherosclerosis (ICD10-I70.0) and Emphysema (ICD10-J43.9). Electronically Signed   By: Rosalene Colon M.D.   On: 11/26/2023 13:09   DG Chest Portable 1 View Result Date: 11/26/2023 CLINICAL DATA:  Shortness of breath. EXAM: PORTABLE CHEST 1 VIEW COMPARISON:  December 30, 2019. FINDINGS: The heart size and mediastinal contours are within normal limits. Right internal jugular Port-A-Cath is noted with distal tip in expected position right atrium. Stable right apical scarring is noted. Minimal bibasilar subsegmental atelectasis or scarring is noted. Bony thorax is unremarkable. IMPRESSION: Stable right apical scarring. Minimal bibasilar subsegmental atelectasis or scarring. Electronically Signed   By: Rosalene Colon M.D.   On: 11/26/2023 10:54    Microbiology: Recent Results (from the past 240 hours)  Resp panel by RT-PCR (RSV, Flu A&B, Covid) Anterior Nasal Swab     Status: None   Collection Time: 12/17/23  5:03 AM   Specimen: Anterior Nasal Swab  Result Value Ref Range Status   SARS Coronavirus 2 by RT PCR NEGATIVE NEGATIVE Final    Comment: (NOTE) SARS-CoV-2 target nucleic acids are NOT DETECTED.  The SARS-CoV-2 RNA is generally detectable in upper respiratory specimens during the acute phase of infection. The lowest concentration of SARS-CoV-2 viral copies this assay can detect is 138 copies/mL. A negative result does not preclude SARS-Cov-2 infection and should not be used as the sole basis for treatment or other patient management decisions. A negative result may occur with  improper specimen collection/handling, submission of specimen  other than nasopharyngeal swab, presence of viral mutation(s) within the areas targeted by this assay, and inadequate number of viral copies(<138 copies/mL). A negative result must be combined with clinical observations, patient history, and epidemiological information. The expected result is Negative.  Fact Sheet for Patients:  BloggerCourse.com  Fact Sheet for Healthcare Providers:  SeriousBroker.it  This test is no t yet approved or cleared by the [place]  FDA and  has been authorized for detection and/or diagnosis of SARS-CoV-2 by FDA under an Emergency Use Authorization (EUA). This EUA will remain  in effect (meaning this test can be used) for the duration of the COVID-19 declaration under Section 564(b)(1) of the Act, 21 U.S.C.section 360bbb-3(b)(1), unless the authorization is terminated  or revoked sooner.       Influenza A by PCR NEGATIVE NEGATIVE Final   Influenza B by PCR NEGATIVE NEGATIVE Final    Comment: (NOTE) The Xpert Xpress SARS-CoV-2/FLU/RSV plus assay is intended as an aid in the diagnosis of influenza from Nasopharyngeal swab specimens and should not be used as a sole basis for treatment. Nasal washings and aspirates are unacceptable for Xpert Xpress SARS-CoV-2/FLU/RSV testing.  Fact Sheet for Patients: BloggerCourse.com  Fact Sheet for Healthcare Providers: SeriousBroker.it  This test is not yet approved or cleared by the [place]  FDA and has been authorized for detection and/or diagnosis of SARS-CoV-2 by FDA under an Emergency Use Authorization (EUA). This EUA will remain in effect (meaning this test can be used) for the duration of the COVID-19 declaration under Section 564(b)(1) of the Act, 21 U.S.C. section 360bbb-3(b)(1), unless the authorization is terminated or revoked.     Resp Syncytial Virus by PCR NEGATIVE NEGATIVE Final     Comment: (NOTE) Fact Sheet for Patients: BloggerCourse.com  Fact Sheet for Healthcare Providers: SeriousBroker.it  This test is not yet approved or  cleared by the [place]  FDA and has been authorized for detection and/or diagnosis of SARS-CoV-2 by FDA under an Emergency Use Authorization (EUA). This EUA will remain in effect (meaning this test can be used) for the duration of the COVID-19 declaration under Section 564(b)(1) of the Act, 21 U.S.C. section 360bbb-3(b)(1), unless the authorization is terminated or revoked.  Performed at [facility], [address], [place], [place] [address]     Time spent: 30 minutes  Signed: Melvinia Stager, MD 12/21/23

## 2023-12-27 NOTE — Progress Notes (Signed)
 Daily Progress Note   Patient Name: Cody [name]       Date: 12/08/2023 DOB: 07-02-1956  Age: 68 y.o. MRN#: [id number] Attending Physician: Melvinia Stager, MD Primary Care Physician: Sowles, Krichna, MD Admit Date: 12/17/2023  Reason for Consultation/Follow-up: Establishing goals of care  HPI/Brief Hospital Review: 68 y.o. male  with past medical history of stage IV non-small cell lung cancer with metastasis to spine, BPH, and hypothyroidism admitted on 12/17/2023 with this of breath.  Patient is being treated for AECOPD.    After discussions with CCM pulmonologist Dr. Aleskerov, patient with support of family has decided to shift to comfort focused care on 5/22.  PMT was consulted to support family and patient with comfort measures.  Subjective: Extensive chart review has been completed prior to meeting patient including labs, vital signs, imaging, progress notes, orders, and available advanced directive documents from current and previous encounters.    Visited with Mr. Cutright at his bedside. He is resting in bed with eyes closed, does not acknowledge my presence in room. No signs of acute distress or discomfort. Multiple family members at bedside shares he had a restful evening and morning. No acute complaints.  Returned to bedside later in shift as requested by nursing staff due to concern for increased [facility] and need for doses of morphine. At bedside, Mr. Harn continues to rest, opens eyes and acknowledges my presence in room, denies acute complaints or concerns. His respirations are even and unlabored. Family at bedside share he has been in and out of being awake, does not have acute complaints when awake, has been reminiscing and joking with family. Suggested to nursing staff to utilize PO  morphine for symptom management to ensure symptoms will be managed prior to discharge home with hospice.  Disposition plan: DME equipment to be delivered in AM per hospice liaison with plan to discharge home once equipment set up.  Answered and addressed all questions and concerns. PMT to continue to follow for ongoing needs and support.  Care plan was discussed with primary team, HL and nursing staff  Thank you for allowing the Palliative Medicine Team to assist in the care of this patient.  Total time:  50 minutes  Time spent includes: Detailed review of medical records (labs, imaging, vital signs), medically appropriate exam (mental status, respiratory, cardiac,  skin), discussed with treatment team, counseling and educating patient, family and staff, documenting clinical information, medication management and coordination of care.  Isadore Marble, DNP, AGNP-C Palliative Medicine   Please contact Palliative Medicine Team phone at [phone] for questions and concerns.

## 2023-12-27 NOTE — Progress Notes (Signed)
 [facility]  - [place] at [facility]   PATIENT NAME: Cody [name]    MR#:  [id number]  DATE OF BIRTH:  [birth year]  SUBJECTIVE:  Met wife and 2 daughters in the room Sleeping. Per wife slept well all night . RN titrating oxygen  down  No respiratory distress.  VITALS:  Blood pressure 116/71, pulse 96, temperature (!) 97.5 F (36.4 C), resp. rate 20, height 5\' 9"  (1.753 m), weight 54.4 kg, SpO2 (!) 87%.  PHYSICAL EXAMINATION:  limited GENERAL:  68 y.o.-year-old patient with no acute distress. Thin cachectic malnourished LUNGS: distant breath sounds bilaterally, CARDIOVASCULAR: S1, S2 normal.  NEUROLOGIC: nonfocal  patient is resting    LABORATORY PANEL:  CBC Recent Labs  Lab 12/17/23 0812  WBC 4.3  HGB 11.3*  HCT 37.6*  PLT 112*    Chemistries  Recent Labs  Lab 12/17/23 0452 12/17/23 0812  NA 143  --   K 4.7  --   CL 84*  --   CO2 >45*  --   GLUCOSE 114*  --   BUN 17  --   CREATININE 0.52* 0.59*  CALCIUM 9.2  --    Cardiac Enzymes No results for input(s): "TROPONINI" in the last 168 hours. RADIOLOGY:  No results found.  Assessment and Plan 68 y.o. 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  High risk for intubation.   Upon EMS arrival patient was hypoxic with O2 saturations in the 80's on his chronic 5L, of which he was subsequently transitioned to NRB and given 125mg  solumedrol , as well dounebs.   Acute on Chronic Hypoxic Respiratory Failure with acute on chronic COPD Exacerbation Stage IV Lung cancer s/p Lobectomy with mets to spine and liver Adult failure to thrive/severe protein calorie malnourishment  Met with pt and his family (3 daughter and 1 son) with Palliative care and Hospice liaison [place] yesterday and plans to  continue monitor patient and if remains relatively stable will consider discharging to home tomorrow with hospice once equipment is delivered.    Family communication :wife and dter Consults :Palliative care CODE STATUS: DNR/DNI DVT Prophylaxis :comfort care Level of care: Palliative Care Status is: Inpatient     TOTAL TIME TAKING CARE OF THIS PATIENT: 25 minutes.  >50% time spent on counselling and coordination of care  Note: This dictation was prepared with Dragon dictation along with smaller phrase technology. Any transcriptional errors that result from this process are unintentional.  Melvinia Stager M.D    [facility]   CC: Primary care physician; Sowles, Krichna, MD

## 2023-12-27 DEATH — deceased

## 2024-01-22 ENCOUNTER — Ambulatory Visit

## 2024-02-03 ENCOUNTER — Other Ambulatory Visit

## 2024-02-03 ENCOUNTER — Ambulatory Visit: Admitting: Internal Medicine

## 2024-06-07 ENCOUNTER — Ambulatory Visit: Admitting: Family Medicine

## 2024-08-02 ENCOUNTER — Encounter: Payer: Self-pay | Admitting: Internal Medicine

## 2024-08-02 NOTE — Progress Notes (Signed)
 Spoke to patients wife and expressed my condolences

## 2024-09-16 ENCOUNTER — Other Ambulatory Visit: Payer: Medicare HMO

## 2024-09-20 ENCOUNTER — Ambulatory Visit: Payer: Medicare HMO | Admitting: Urology
